# Patient Record
Sex: Female | Born: 1955
Health system: Southern US, Community
[De-identification: ages and names within clinical notes are randomized; demographics above are authoritative.]

## PROBLEM LIST (undated history)

## (undated) DIAGNOSIS — D473 Essential (hemorrhagic) thrombocythemia: Secondary | ICD-10-CM

## (undated) DIAGNOSIS — R569 Unspecified convulsions: Secondary | ICD-10-CM

## (undated) DIAGNOSIS — J189 Pneumonia, unspecified organism: Secondary | ICD-10-CM

## (undated) DIAGNOSIS — I701 Atherosclerosis of renal artery: Secondary | ICD-10-CM

## (undated) DIAGNOSIS — F419 Anxiety disorder, unspecified: Secondary | ICD-10-CM

## (undated) DIAGNOSIS — N189 Chronic kidney disease, unspecified: Secondary | ICD-10-CM

## (undated) DIAGNOSIS — R197 Diarrhea, unspecified: Secondary | ICD-10-CM

## (undated) DIAGNOSIS — R112 Nausea with vomiting, unspecified: Secondary | ICD-10-CM

## (undated) DIAGNOSIS — D759 Disease of blood and blood-forming organs, unspecified: Secondary | ICD-10-CM

## (undated) DIAGNOSIS — I739 Peripheral vascular disease, unspecified: Secondary | ICD-10-CM

## (undated) DIAGNOSIS — G4733 Obstructive sleep apnea (adult) (pediatric): Secondary | ICD-10-CM

## (undated) DIAGNOSIS — R55 Syncope and collapse: Secondary | ICD-10-CM

## (undated) DIAGNOSIS — I219 Acute myocardial infarction, unspecified: Secondary | ICD-10-CM

## (undated) DIAGNOSIS — R7303 Prediabetes: Secondary | ICD-10-CM

## (undated) DIAGNOSIS — D75839 Thrombocytosis, unspecified: Secondary | ICD-10-CM

## (undated) DIAGNOSIS — I10 Essential (primary) hypertension: Secondary | ICD-10-CM

## (undated) DIAGNOSIS — I517 Cardiomegaly: Secondary | ICD-10-CM

## (undated) DIAGNOSIS — M109 Gout, unspecified: Secondary | ICD-10-CM

## (undated) DIAGNOSIS — E785 Hyperlipidemia, unspecified: Secondary | ICD-10-CM

## (undated) HISTORY — DX: Cardiomegaly: I51.7

## (undated) HISTORY — DX: Essential (hemorrhagic) thrombocythemia: D47.3

## (undated) HISTORY — PX: BREAST CYST EXCISION: SHX579

## (undated) HISTORY — DX: Hyperlipidemia, unspecified: E78.5

## (undated) HISTORY — DX: Obstructive sleep apnea (adult) (pediatric): G47.33

## (undated) HISTORY — DX: Thrombocytosis, unspecified: D75.839

## (undated) HISTORY — DX: Nausea with vomiting, unspecified: R11.2

## (undated) HISTORY — DX: Syncope and collapse: R55

## (undated) HISTORY — DX: Gout, unspecified: M10.9

## (undated) HISTORY — DX: Morbid (severe) obesity due to excess calories: E66.01

## (undated) HISTORY — DX: Nausea with vomiting, unspecified: R19.7

## (undated) HISTORY — DX: Essential (primary) hypertension: I10

## (undated) HISTORY — DX: Peripheral vascular disease, unspecified: I73.9

## (undated) HISTORY — DX: Atherosclerosis of renal artery: I70.1

## (undated) HISTORY — PX: OTHER SURGICAL HISTORY: SHX169

---

## 1991-10-25 HISTORY — PX: EXTREMITY CYST EXCISION: SHX1558

## 1992-10-24 HISTORY — PX: PARTIAL HYSTERECTOMY: SHX80

## 1993-10-24 HISTORY — PX: BREAST REDUCTION SURGERY: SHX8

## 1999-10-06 ENCOUNTER — Other Ambulatory Visit: Admission: RE | Admit: 1999-10-06 | Discharge: 1999-10-06 | Payer: Self-pay | Admitting: *Deleted

## 2001-01-01 ENCOUNTER — Other Ambulatory Visit: Admission: RE | Admit: 2001-01-01 | Discharge: 2001-01-01 | Payer: Self-pay | Admitting: *Deleted

## 2002-10-01 ENCOUNTER — Other Ambulatory Visit: Admission: RE | Admit: 2002-10-01 | Discharge: 2002-10-01 | Payer: Self-pay | Admitting: *Deleted

## 2003-10-28 ENCOUNTER — Emergency Department (HOSPITAL_COMMUNITY): Admission: EM | Admit: 2003-10-28 | Discharge: 2003-10-29 | Payer: Self-pay | Admitting: Emergency Medicine

## 2005-03-16 ENCOUNTER — Other Ambulatory Visit: Admission: RE | Admit: 2005-03-16 | Discharge: 2005-03-16 | Payer: Self-pay | Admitting: *Deleted

## 2006-10-18 ENCOUNTER — Other Ambulatory Visit: Admission: RE | Admit: 2006-10-18 | Discharge: 2006-10-18 | Payer: Self-pay | Admitting: *Deleted

## 2007-03-11 ENCOUNTER — Encounter: Admission: RE | Admit: 2007-03-11 | Discharge: 2007-03-11 | Payer: Self-pay | Admitting: Emergency Medicine

## 2007-03-30 ENCOUNTER — Encounter: Admission: RE | Admit: 2007-03-30 | Discharge: 2007-03-30 | Payer: Self-pay | Admitting: Emergency Medicine

## 2007-10-23 ENCOUNTER — Other Ambulatory Visit: Admission: RE | Admit: 2007-10-23 | Discharge: 2007-10-23 | Payer: Self-pay | Admitting: *Deleted

## 2008-12-10 ENCOUNTER — Ambulatory Visit: Payer: Self-pay | Admitting: Hematology and Oncology

## 2008-12-18 LAB — CBC WITH DIFFERENTIAL/PLATELET
Eosinophils Absolute: 0.2 10*3/uL (ref 0.0–0.5)
HCT: 41.1 % (ref 34.8–46.6)
LYMPH%: 16.6 % (ref 14.0–49.7)
MONO#: 0.5 10*3/uL (ref 0.1–0.9)
NEUT#: 5.8 10*3/uL (ref 1.5–6.5)
NEUT%: 73.9 % (ref 38.4–76.8)
Platelets: 783 10*3/uL — ABNORMAL HIGH (ref 145–400)
WBC: 7.9 10*3/uL (ref 3.9–10.3)

## 2008-12-23 LAB — COMPREHENSIVE METABOLIC PANEL
CO2: 28 mEq/L (ref 19–32)
Creatinine, Ser: 1.12 mg/dL (ref 0.40–1.20)
Glucose, Bld: 113 mg/dL — ABNORMAL HIGH (ref 70–99)
Total Bilirubin: 0.3 mg/dL (ref 0.3–1.2)

## 2008-12-23 LAB — PROTEIN ELECTROPHORESIS, SERUM
Albumin ELP: 55.5 % — ABNORMAL LOW (ref 55.8–66.1)
Alpha-1-Globulin: 4.6 % (ref 2.9–4.9)
Gamma Globulin: 16.6 % (ref 11.1–18.8)

## 2008-12-23 LAB — LACTATE DEHYDROGENASE: LDH: 217 U/L (ref 94–250)

## 2008-12-23 LAB — FERRITIN: Ferritin: 214 ng/mL (ref 10–291)

## 2008-12-23 LAB — JAK2 GENOTYPR

## 2009-01-20 ENCOUNTER — Ambulatory Visit (HOSPITAL_COMMUNITY): Admission: RE | Admit: 2009-01-20 | Discharge: 2009-01-20 | Payer: Self-pay | Admitting: Hematology and Oncology

## 2009-01-20 ENCOUNTER — Encounter (INDEPENDENT_AMBULATORY_CARE_PROVIDER_SITE_OTHER): Payer: Self-pay | Admitting: Interventional Radiology

## 2009-01-26 ENCOUNTER — Ambulatory Visit: Payer: Self-pay | Admitting: Hematology and Oncology

## 2009-02-26 LAB — CBC WITH DIFFERENTIAL/PLATELET
Eosinophils Absolute: 0.4 10*3/uL (ref 0.0–0.5)
LYMPH%: 17.9 % (ref 14.0–49.7)
MONO#: 0.4 10*3/uL (ref 0.1–0.9)
NEUT#: 6 10*3/uL (ref 1.5–6.5)
Platelets: 734 10*3/uL — ABNORMAL HIGH (ref 145–400)
RBC: 4.57 10*6/uL (ref 3.70–5.45)
RDW: 13.4 % (ref 11.2–14.5)
WBC: 8.2 10*3/uL (ref 3.9–10.3)

## 2009-05-26 ENCOUNTER — Ambulatory Visit: Payer: Self-pay | Admitting: Hematology and Oncology

## 2009-05-28 LAB — CBC WITH DIFFERENTIAL/PLATELET
BASO%: 0.8 % (ref 0.0–2.0)
Basophils Absolute: 0.1 10*3/uL (ref 0.0–0.1)
EOS%: 4.2 % (ref 0.0–7.0)
HGB: 13.8 g/dL (ref 11.6–15.9)
MCH: 29.4 pg (ref 25.1–34.0)
MCHC: 33.6 g/dL (ref 31.5–36.0)
MCV: 87.5 fL (ref 79.5–101.0)
MONO%: 5.5 % (ref 0.0–14.0)
RBC: 4.68 10*6/uL (ref 3.70–5.45)
RDW: 14.3 % (ref 11.2–14.5)

## 2009-05-28 LAB — BASIC METABOLIC PANEL
BUN: 19 mg/dL (ref 6–23)
Potassium: 4 mEq/L (ref 3.5–5.3)

## 2009-11-24 ENCOUNTER — Emergency Department (HOSPITAL_COMMUNITY): Admission: EM | Admit: 2009-11-24 | Discharge: 2009-11-24 | Payer: Self-pay | Admitting: Family Medicine

## 2009-12-09 ENCOUNTER — Ambulatory Visit: Payer: Self-pay | Admitting: Hematology and Oncology

## 2009-12-11 LAB — CBC WITH DIFFERENTIAL/PLATELET
Basophils Absolute: 0 10*3/uL (ref 0.0–0.1)
Eosinophils Absolute: 0.5 10*3/uL (ref 0.0–0.5)
HCT: 40.2 % (ref 34.8–46.6)
HGB: 13.5 g/dL (ref 11.6–15.9)
LYMPH%: 21.8 % (ref 14.0–49.7)
MCV: 89.6 fL (ref 79.5–101.0)
MONO#: 0.4 10*3/uL (ref 0.1–0.9)
MONO%: 4.9 % (ref 0.0–14.0)
NEUT#: 5.2 10*3/uL (ref 1.5–6.5)
NEUT%: 66.4 % (ref 38.4–76.8)
Platelets: 691 10*3/uL — ABNORMAL HIGH (ref 145–400)
RBC: 4.49 10*6/uL (ref 3.70–5.45)
WBC: 7.8 10*3/uL (ref 3.9–10.3)

## 2009-12-11 LAB — BASIC METABOLIC PANEL
BUN: 17 mg/dL (ref 6–23)
CO2: 27 mEq/L (ref 19–32)
Calcium: 9.1 mg/dL (ref 8.4–10.5)
Creatinine, Ser: 1.19 mg/dL (ref 0.40–1.20)
Glucose, Bld: 145 mg/dL — ABNORMAL HIGH (ref 70–99)

## 2010-04-06 ENCOUNTER — Ambulatory Visit: Payer: Self-pay | Admitting: Hematology and Oncology

## 2010-04-14 LAB — CBC WITH DIFFERENTIAL/PLATELET
BASO%: 0.6 % (ref 0.0–2.0)
EOS%: 4.9 % (ref 0.0–7.0)
HCT: 40.3 % (ref 34.8–46.6)
LYMPH%: 24.8 % (ref 14.0–49.7)
MCH: 29.9 pg (ref 25.1–34.0)
MCHC: 34.3 g/dL (ref 31.5–36.0)
MCV: 87.2 fL (ref 79.5–101.0)
NEUT%: 63.3 % (ref 38.4–76.8)
Platelets: 828 10*3/uL — ABNORMAL HIGH (ref 145–400)

## 2010-04-14 LAB — BASIC METABOLIC PANEL
BUN: 22 mg/dL (ref 6–23)
Calcium: 9.6 mg/dL (ref 8.4–10.5)
Creatinine, Ser: 1.27 mg/dL — ABNORMAL HIGH (ref 0.40–1.20)

## 2010-04-20 LAB — CBC WITH DIFFERENTIAL/PLATELET
Basophils Absolute: 0 10*3/uL (ref 0.0–0.1)
EOS%: 3.9 % (ref 0.0–7.0)
Eosinophils Absolute: 0.2 10*3/uL (ref 0.0–0.5)
HGB: 13.3 g/dL (ref 11.6–15.9)
MCH: 29.8 pg (ref 25.1–34.0)
NEUT#: 4.1 10*3/uL (ref 1.5–6.5)
RBC: 4.45 10*6/uL (ref 3.70–5.45)
RDW: 14.3 % (ref 11.2–14.5)
lymph#: 1.6 10*3/uL (ref 0.9–3.3)

## 2010-04-20 LAB — BASIC METABOLIC PANEL
BUN: 25 mg/dL — ABNORMAL HIGH (ref 6–23)
Chloride: 101 mEq/L (ref 96–112)
Potassium: 3.6 mEq/L (ref 3.5–5.3)
Sodium: 139 mEq/L (ref 135–145)

## 2010-04-27 LAB — CBC WITH DIFFERENTIAL/PLATELET
Basophils Absolute: 0 10*3/uL (ref 0.0–0.1)
Eosinophils Absolute: 0.2 10*3/uL (ref 0.0–0.5)
HCT: 38.1 % (ref 34.8–46.6)
LYMPH%: 32.6 % (ref 14.0–49.7)
MCHC: 34.5 g/dL (ref 31.5–36.0)
MONO#: 0.3 10*3/uL (ref 0.1–0.9)
NEUT#: 2.9 10*3/uL (ref 1.5–6.5)
NEUT%: 56.9 % (ref 38.4–76.8)
Platelets: 567 10*3/uL — ABNORMAL HIGH (ref 145–400)
WBC: 5.1 10*3/uL (ref 3.9–10.3)

## 2010-05-26 ENCOUNTER — Ambulatory Visit: Payer: Self-pay | Admitting: Hematology and Oncology

## 2010-05-26 LAB — CBC WITH DIFFERENTIAL/PLATELET
BASO%: 0.7 % (ref 0.0–2.0)
EOS%: 3.7 % (ref 0.0–7.0)
MCH: 31.4 pg (ref 25.1–34.0)
MCV: 90.6 fL (ref 79.5–101.0)
MONO%: 6.8 % (ref 0.0–14.0)
RBC: 4.13 10*6/uL (ref 3.70–5.45)
RDW: 18.8 % — ABNORMAL HIGH (ref 11.2–14.5)

## 2010-06-09 LAB — CBC WITH DIFFERENTIAL/PLATELET
Eosinophils Absolute: 0.2 10*3/uL (ref 0.0–0.5)
MONO#: 0.4 10*3/uL (ref 0.1–0.9)
MONO%: 8.7 % (ref 0.0–14.0)
NEUT#: 2.9 10*3/uL (ref 1.5–6.5)
RBC: 3.85 10*6/uL (ref 3.70–5.45)
RDW: 23.7 % — ABNORMAL HIGH (ref 11.2–14.5)
WBC: 4.9 10*3/uL (ref 3.9–10.3)
lymph#: 1.3 10*3/uL (ref 0.9–3.3)

## 2010-06-18 LAB — CBC WITH DIFFERENTIAL/PLATELET
Eosinophils Absolute: 0.2 10*3/uL (ref 0.0–0.5)
LYMPH%: 25.5 % (ref 14.0–49.7)
MONO#: 0.5 10*3/uL (ref 0.1–0.9)
NEUT#: 3.8 10*3/uL (ref 1.5–6.5)
Platelets: 369 10*3/uL (ref 145–400)
RBC: 3.97 10*6/uL (ref 3.70–5.45)
WBC: 6 10*3/uL (ref 3.9–10.3)

## 2010-07-02 ENCOUNTER — Ambulatory Visit: Payer: Self-pay | Admitting: Hematology and Oncology

## 2010-07-20 LAB — BASIC METABOLIC PANEL
BUN: 18 mg/dL (ref 6–23)
CO2: 28 mEq/L (ref 19–32)
Chloride: 103 mEq/L (ref 96–112)
Creatinine, Ser: 1.33 mg/dL — ABNORMAL HIGH (ref 0.40–1.20)

## 2010-07-20 LAB — CBC WITH DIFFERENTIAL/PLATELET
Basophils Absolute: 0 10*3/uL (ref 0.0–0.1)
EOS%: 4 % (ref 0.0–7.0)
HCT: 36.8 % (ref 34.8–46.6)
HGB: 12.5 g/dL (ref 11.6–15.9)
LYMPH%: 26.8 % (ref 14.0–49.7)
MCH: 34 pg (ref 25.1–34.0)
MONO#: 0.4 10*3/uL (ref 0.1–0.9)
NEUT%: 60.8 % (ref 38.4–76.8)
Platelets: 384 10*3/uL (ref 145–400)
lymph#: 1.5 10*3/uL (ref 0.9–3.3)

## 2010-08-02 ENCOUNTER — Ambulatory Visit: Payer: Self-pay | Admitting: Hematology and Oncology

## 2010-08-04 LAB — CBC WITH DIFFERENTIAL/PLATELET
BASO%: 0.6 % (ref 0.0–2.0)
EOS%: 3.1 % (ref 0.0–7.0)
HCT: 34.7 % — ABNORMAL LOW (ref 34.8–46.6)
LYMPH%: 25 % (ref 14.0–49.7)
MCH: 36.2 pg — ABNORMAL HIGH (ref 25.1–34.0)
MCHC: 35.3 g/dL (ref 31.5–36.0)
MONO#: 0.4 10*3/uL (ref 0.1–0.9)
NEUT%: 63.4 % (ref 38.4–76.8)
Platelets: 398 10*3/uL (ref 145–400)
RBC: 3.38 10*6/uL — ABNORMAL LOW (ref 3.70–5.45)
WBC: 4.9 10*3/uL (ref 3.9–10.3)
lymph#: 1.2 10*3/uL (ref 0.9–3.3)

## 2010-09-01 ENCOUNTER — Ambulatory Visit: Payer: Self-pay | Admitting: Hematology and Oncology

## 2010-09-01 LAB — CBC WITH DIFFERENTIAL/PLATELET
Basophils Absolute: 0 10*3/uL (ref 0.0–0.1)
Eosinophils Absolute: 0.1 10*3/uL (ref 0.0–0.5)
HCT: 36.8 % (ref 34.8–46.6)
HGB: 12.4 g/dL (ref 11.6–15.9)
MCV: 108.1 fL — ABNORMAL HIGH (ref 79.5–101.0)
NEUT#: 3 10*3/uL (ref 1.5–6.5)
NEUT%: 63.7 % (ref 38.4–76.8)
RDW: 13 % (ref 11.2–14.5)
lymph#: 1.2 10*3/uL (ref 0.9–3.3)

## 2010-10-04 ENCOUNTER — Ambulatory Visit: Payer: Self-pay | Admitting: Hematology and Oncology

## 2010-10-06 LAB — CBC WITH DIFFERENTIAL/PLATELET
BASO%: 0.5 % (ref 0.0–2.0)
EOS%: 3.2 % (ref 0.0–7.0)
HCT: 36.5 % (ref 34.8–46.6)
MCH: 38.4 pg — ABNORMAL HIGH (ref 25.1–34.0)
MCHC: 34.4 g/dL (ref 31.5–36.0)
MONO#: 0.4 10*3/uL (ref 0.1–0.9)
NEUT%: 63.8 % (ref 38.4–76.8)
RBC: 3.27 10*6/uL — ABNORMAL LOW (ref 3.70–5.45)
WBC: 5.1 10*3/uL (ref 3.9–10.3)
lymph#: 1.3 10*3/uL (ref 0.9–3.3)

## 2010-10-06 LAB — BASIC METABOLIC PANEL
CO2: 29 mEq/L (ref 19–32)
Calcium: 9.1 mg/dL (ref 8.4–10.5)
Chloride: 100 mEq/L (ref 96–112)
Creatinine, Ser: 1.4 mg/dL — ABNORMAL HIGH (ref 0.40–1.20)
Sodium: 141 mEq/L (ref 135–145)

## 2010-11-05 ENCOUNTER — Ambulatory Visit: Payer: Self-pay | Admitting: Hematology and Oncology

## 2010-11-09 LAB — CBC WITH DIFFERENTIAL/PLATELET
BASO%: 0.6 % (ref 0.0–2.0)
Basophils Absolute: 0 10*3/uL (ref 0.0–0.1)
EOS%: 3.7 % (ref 0.0–7.0)
Eosinophils Absolute: 0.2 10*3/uL (ref 0.0–0.5)
HCT: 36.5 % (ref 34.8–46.6)
HGB: 12.5 g/dL (ref 11.6–15.9)
LYMPH%: 33.4 % (ref 14.0–49.7)
MCH: 37.9 pg — ABNORMAL HIGH (ref 25.1–34.0)
MCHC: 34.3 g/dL (ref 31.5–36.0)
MCV: 110.3 fL — ABNORMAL HIGH (ref 79.5–101.0)
MONO#: 0.5 10*3/uL (ref 0.1–0.9)
MONO%: 9.6 % (ref 0.0–14.0)
NEUT#: 2.7 10*3/uL (ref 1.5–6.5)
NEUT%: 52.7 % (ref 38.4–76.8)
Platelets: 344 10*3/uL (ref 145–400)
RBC: 3.31 10*6/uL — ABNORMAL LOW (ref 3.70–5.45)
RDW: 13.4 % (ref 11.2–14.5)
WBC: 5.2 10*3/uL (ref 3.9–10.3)
lymph#: 1.7 10*3/uL (ref 0.9–3.3)

## 2010-12-07 ENCOUNTER — Ambulatory Visit
Admission: RE | Admit: 2010-12-07 | Discharge: 2010-12-07 | Disposition: A | Discharge status: Home / Self Care | Payer: BC Managed Care – PPO | Source: Ambulatory Visit | Attending: Cardiovascular Disease | Admitting: Cardiovascular Disease

## 2010-12-07 ENCOUNTER — Other Ambulatory Visit: Payer: Self-pay | Admitting: Hematology and Oncology

## 2010-12-07 ENCOUNTER — Other Ambulatory Visit: Payer: Self-pay | Admitting: Cardiovascular Disease

## 2010-12-07 ENCOUNTER — Encounter (HOSPITAL_BASED_OUTPATIENT_CLINIC_OR_DEPARTMENT_OTHER): Payer: BC Managed Care – PPO | Admitting: Hematology and Oncology

## 2010-12-07 DIAGNOSIS — D473 Essential (hemorrhagic) thrombocythemia: Secondary | ICD-10-CM

## 2010-12-07 DIAGNOSIS — Z01818 Encounter for other preprocedural examination: Secondary | ICD-10-CM

## 2010-12-07 DIAGNOSIS — Z23 Encounter for immunization: Secondary | ICD-10-CM

## 2010-12-07 LAB — CBC WITH DIFFERENTIAL/PLATELET
BASO%: 0.5 % (ref 0.0–2.0)
EOS%: 3.7 % (ref 0.0–7.0)
HCT: 36.3 % (ref 34.8–46.6)
MCH: 38.3 pg — ABNORMAL HIGH (ref 25.1–34.0)
MCHC: 34.1 g/dL (ref 31.5–36.0)
MONO#: 0.5 10*3/uL (ref 0.1–0.9)
RDW: 13.4 % (ref 11.2–14.5)
WBC: 5.5 10*3/uL (ref 3.9–10.3)
lymph#: 1.8 10*3/uL (ref 0.9–3.3)

## 2010-12-13 ENCOUNTER — Observation Stay (HOSPITAL_COMMUNITY)
Admission: RE | Admit: 2010-12-13 | Discharge: 2010-12-14 | DRG: 479 | Disposition: A | Discharge status: Home / Self Care | Payer: BC Managed Care – PPO | Source: Ambulatory Visit | Attending: Cardiovascular Disease | Admitting: Cardiovascular Disease

## 2010-12-13 DIAGNOSIS — N289 Disorder of kidney and ureter, unspecified: Secondary | ICD-10-CM | POA: Insufficient documentation

## 2010-12-13 DIAGNOSIS — D473 Essential (hemorrhagic) thrombocythemia: Secondary | ICD-10-CM | POA: Insufficient documentation

## 2010-12-13 DIAGNOSIS — I701 Atherosclerosis of renal artery: Secondary | ICD-10-CM | POA: Insufficient documentation

## 2010-12-13 DIAGNOSIS — E119 Type 2 diabetes mellitus without complications: Secondary | ICD-10-CM | POA: Insufficient documentation

## 2010-12-13 DIAGNOSIS — I150 Renovascular hypertension: Secondary | ICD-10-CM | POA: Insufficient documentation

## 2010-12-13 HISTORY — DX: Atherosclerosis of renal artery: I70.1

## 2010-12-13 LAB — POCT ACTIVATED CLOTTING TIME
Activated Clotting Time: 181 seconds
Activated Clotting Time: 140 seconds

## 2010-12-13 LAB — GLUCOSE, CAPILLARY
Glucose-Capillary: 131 mg/dL — ABNORMAL HIGH (ref 70–99)
Glucose-Capillary: 86 mg/dL (ref 70–99)

## 2010-12-14 LAB — GLUCOSE, CAPILLARY

## 2010-12-14 LAB — BASIC METABOLIC PANEL
CO2: 29 mEq/L (ref 19–32)
Calcium: 8.9 mg/dL (ref 8.4–10.5)
Creatinine, Ser: 1.31 mg/dL — ABNORMAL HIGH (ref 0.4–1.2)
GFR calc Af Amer: 51 mL/min — ABNORMAL LOW (ref >60)
GFR calc non Af Amer: 42 mL/min — ABNORMAL LOW (ref >60)
Sodium: 141 mEq/L (ref 135–145)

## 2010-12-14 LAB — CBC
MCH: 37 pg — ABNORMAL HIGH (ref 26.0–34.0)
MCHC: 34.8 g/dL (ref 30.0–36.0)
Platelets: 294 10*3/uL (ref 150–400)
RDW: 12.4 % (ref 11.5–15.5)

## 2010-12-15 NOTE — Procedures (Signed)
Sara [name], Sara [name]              ACCOUNT NO.:  [id number]  MEDICAL RECORD NO.:  [id number]           PATIENT TYPE:  I  LOCATION:  6531                         FACILITY:  [facility]  PHYSICIAN:  Nanetta Batty, M.D.   DATE OF BIRTH:  [birth year]  DATE OF PROCEDURE: DATE OF DISCHARGE:                   PERIPHERAL VASCULAR INVASIVE PROCEDURE   PROCEDURES:  Abdominal aortogram, selective right renal angiogram, right renal artery PTA, and stent procedure.  Ms. Bollman is a 55 year old mildly overweight African American female with history of difficult to control hypertension and thrombocytosis. She has been followed by Dr. Rachelle Hora Croitoru on multiple antihypertensive medications.  Renal Dopplers showed right renal artery stenosis with progression of disease on serial studies.  She presents now for angiography and potential intervention for treatment of renovascular hypertension.  PROCEDURE DESCRIPTION:  The patient was brought to the second floor [facility] in the postabsorptive state.  She was premedicated with p.o. Valium, IV Versed, and fentanyl.  Her right groin was prepped and shaved in the usual sterile fashion.  A 1% Xylocaine was used for local anesthesia.  A 6-French sheath was inserted into the right femoral artery using standard Seldinger technique.  A 6-French pigtail catheter was used for abdominal aortography.  A 5-French right Judkins catheter was used for right renal artery angiography.  Pullback gradient was obtained across the ostium of the vessel which measured 50 mmHg.  Visipaque dye was used for  the entirety of the case.  Retrograde aortic pressures were monitored during the case.  ANGIOGRAPHIC RESULTS: 1. Abdominal aorta.     a.     Renal artery - 80-90% ostial right renal artery stenosis      with 15-mm gradient.     b.     30% proximal left renal artery stenosis.  PROCEDURE DESCRIPTION:  The patient received 4000 units of  heparin intravenously.  Using a 6-French short JR-4 guide catheter along with an 0.14 x 190 Spartacore wire and a 4 x 1.5 Aviator balloon pre dilatation was performed.  The ostium of right renal artery was then stented with a 6 x 15 Genesis on aviator balloon stent premount.  This was deployed under angiographic control at 10 atmospheres resulting reduction of 90% ostial right renal artery stenosis to 0% residual.  The patient tolerated the procedure well.  Pigtail catheter was removed.  Sheath was sewn securely in place.  The patient left lab in stable condition.  She did receive 600 mg of Plavix at the end of the case.  IMPRESSION:  Successful right renal artery percutaneous transluminal angioplasty and stenting for high-grade right renal artery stenosis and a suggestion of renal vascular hypertension using bare-metal stent.  The patient was Plavix loaded.  The patient was gently hydrated overnight, and discharged home in the morning.  She will get followup renal Doppler studies, and will see me back in followup.  She left lab in stable condition.     Nanetta Batty, M.D.     Cordelia Pen  D:  12/13/2010  T:  12/14/2010  Job:  [id number]  cc:   [facility],  MD  Electronically Signed by Nanetta Batty M.D. on 12/15/2010 11:35:08 AM

## 2011-01-05 NOTE — Discharge Summary (Signed)
  Sara [name], WAREING              ACCOUNT NO.:  [id number]  MEDICAL RECORD NO.:  [id number]           PATIENT TYPE:  I  LOCATION:  6531                         FACILITY:  [facility]  PHYSICIAN:  Nanetta Batty, M.D.   DATE OF BIRTH:  27-Sep-1956  DATE OF ADMISSION:  12/13/2010 DATE OF DISCHARGE:  12/14/2010                              DISCHARGE SUMMARY   DISCHARGE DIAGNOSIS: 1. Peripheral vascular disease, status post elective right renal     artery stenting this admission. 2. Treated hypertension. 3. Stage I renal insufficiency. 4. Type 2 non-insulin-dependent diabetes. 5. Morbid obesity. 6. Essential thrombocytosis.  HOSPITAL COURSE:  Ms. Blubaugh is a 55 year old female followed by Dr. Jeral Fruit and Dr. Allyne Gee, who has hypertension and diabetes.  She has had renal artery stenosis by Dopplers.  She had progression of disease.  She saw Dr. Allyson Sabal in January and he was concerned about renal preservation. She was set up for an elective renal artery stenting.  The patient was admitted on December 13, 2010, underwent peripheral angiogram and renal artery stenting as noted and she tolerated this well.  On December 14, 2010, she is ambulatory without complaints.  Her renal function shows a BUN of 17, creatinine 1.31.  We feel she can be discharged later on December 14, 2010.  She will be discharged on aspirin and Plavix.  She will need follow up renal Dopplers and a follow up with Dr. Allyson Sabal as an outpatient.  LABORATORY DATA:  White count 4.6, hemoglobin 11.8, hematocrit 33.9, platelets 294.  Sodium 141, potassium 3.9, BUN 17, creatinine 1.31. Chest x-ray done on December 07, 2010, shows no active disease.  DISPOSITION:  The patient is discharged in stable condition and will follow up with Dr. Allyson Sabal as noted.  Please see med rec for complete discharge medications.     Abelino Derrick, P.A.   ______________________________ Nanetta Batty, M.D.   Lenard Lance  D:  12/14/2010  T:   12/14/2010  Job:  [id number]  cc:   Vicente Serene I. Dalene Carrow, M.D. Candyce Churn. Allyne Gee, M.D.  Electronically Signed by Corine Shelter P.A. on 12/15/2010 12:24:19 PM Electronically Signed by Nanetta Batty M.D. on 01/05/2011 01:56:56 PM

## 2011-01-11 ENCOUNTER — Other Ambulatory Visit: Payer: Self-pay | Admitting: Hematology and Oncology

## 2011-01-11 ENCOUNTER — Encounter (HOSPITAL_BASED_OUTPATIENT_CLINIC_OR_DEPARTMENT_OTHER): Payer: BC Managed Care – PPO | Admitting: Hematology and Oncology

## 2011-01-11 DIAGNOSIS — Z23 Encounter for immunization: Secondary | ICD-10-CM

## 2011-01-11 DIAGNOSIS — D473 Essential (hemorrhagic) thrombocythemia: Secondary | ICD-10-CM

## 2011-01-11 LAB — BASIC METABOLIC PANEL
BUN: 20 mg/dL (ref 6–23)
Calcium: 9.4 mg/dL (ref 8.4–10.5)
Creatinine, Ser: 1.31 mg/dL — ABNORMAL HIGH (ref 0.40–1.20)
Glucose, Bld: 100 mg/dL — ABNORMAL HIGH (ref 70–99)
Potassium: 3.8 mEq/L (ref 3.5–5.3)

## 2011-01-11 LAB — CBC WITH DIFFERENTIAL/PLATELET
Basophils Absolute: 0 10*3/uL (ref 0.0–0.1)
EOS%: 3.8 % (ref 0.0–7.0)
Eosinophils Absolute: 0.2 10*3/uL (ref 0.0–0.5)
HGB: 12.3 g/dL (ref 11.6–15.9)
LYMPH%: 32.1 % (ref 14.0–49.7)
MCH: 37.5 pg — ABNORMAL HIGH (ref 25.1–34.0)
MCV: 106.4 fL — ABNORMAL HIGH (ref 79.5–101.0)
MONO%: 8.2 % (ref 0.0–14.0)
NEUT#: 2.7 10*3/uL (ref 1.5–6.5)
NEUT%: 55.5 % (ref 38.4–76.8)
Platelets: 323 10*3/uL (ref 145–400)

## 2011-02-03 LAB — TISSUE HYBRIDIZATION (BONE MARROW)-NCBH

## 2011-02-03 LAB — PROTIME-INR
INR: 0.9 (ref 0.00–1.49)
Prothrombin Time: 12.4 seconds (ref 11.6–15.2)

## 2011-02-03 LAB — CBC
MCV: 88.7 fL (ref 78.0–100.0)
Platelets: 881 10*3/uL — ABNORMAL HIGH (ref 150–400)
WBC: 8.2 10*3/uL (ref 4.0–10.5)

## 2011-02-03 LAB — CHROMOSOME ANALYSIS, BONE MARROW

## 2011-03-09 ENCOUNTER — Other Ambulatory Visit: Payer: Self-pay | Admitting: Hematology and Oncology

## 2011-03-09 ENCOUNTER — Encounter (HOSPITAL_BASED_OUTPATIENT_CLINIC_OR_DEPARTMENT_OTHER): Payer: BC Managed Care – PPO | Admitting: Hematology and Oncology

## 2011-03-09 DIAGNOSIS — Z23 Encounter for immunization: Secondary | ICD-10-CM

## 2011-03-09 DIAGNOSIS — D473 Essential (hemorrhagic) thrombocythemia: Secondary | ICD-10-CM

## 2011-03-09 LAB — CBC WITH DIFFERENTIAL/PLATELET
BASO%: 0.5 % (ref 0.0–2.0)
Eosinophils Absolute: 0.2 10*3/uL (ref 0.0–0.5)
HCT: 34.5 % — ABNORMAL LOW (ref 34.8–46.6)
MCHC: 34.8 g/dL (ref 31.5–36.0)
MONO#: 0.4 10*3/uL (ref 0.1–0.9)
NEUT#: 2.5 10*3/uL (ref 1.5–6.5)
NEUT%: 57.6 % (ref 38.4–76.8)
Platelets: 308 10*3/uL (ref 145–400)
RBC: 3.07 10*6/uL — ABNORMAL LOW (ref 3.70–5.45)
WBC: 4.3 10*3/uL (ref 3.9–10.3)
lymph#: 1.2 10*3/uL (ref 0.9–3.3)

## 2011-06-03 ENCOUNTER — Other Ambulatory Visit: Payer: Self-pay | Admitting: Hematology and Oncology

## 2011-06-03 ENCOUNTER — Encounter (HOSPITAL_BASED_OUTPATIENT_CLINIC_OR_DEPARTMENT_OTHER): Payer: BC Managed Care – PPO | Admitting: Hematology and Oncology

## 2011-06-03 DIAGNOSIS — B029 Zoster without complications: Secondary | ICD-10-CM

## 2011-06-03 DIAGNOSIS — D473 Essential (hemorrhagic) thrombocythemia: Secondary | ICD-10-CM

## 2011-06-03 LAB — BASIC METABOLIC PANEL
BUN: 22 mg/dL (ref 6–23)
CO2: 27 mEq/L (ref 19–32)
Chloride: 103 mEq/L (ref 96–112)
Creatinine, Ser: 1.42 mg/dL — ABNORMAL HIGH (ref 0.50–1.10)
Glucose, Bld: 96 mg/dL (ref 70–99)

## 2011-06-03 LAB — CBC WITH DIFFERENTIAL/PLATELET
Basophils Absolute: 0 10*3/uL (ref 0.0–0.1)
HCT: 33.8 % — ABNORMAL LOW (ref 34.8–46.6)
HGB: 11.9 g/dL (ref 11.6–15.9)
LYMPH%: 29 % (ref 14.0–49.7)
MONO#: 0.5 10*3/uL (ref 0.1–0.9)
NEUT%: 49.1 % (ref 38.4–76.8)
Platelets: 300 10*3/uL (ref 145–400)
WBC: 3 10*3/uL — ABNORMAL LOW (ref 3.9–10.3)
lymph#: 0.9 10*3/uL (ref 0.9–3.3)

## 2011-06-29 ENCOUNTER — Encounter (HOSPITAL_BASED_OUTPATIENT_CLINIC_OR_DEPARTMENT_OTHER): Payer: BC Managed Care – PPO | Admitting: Hematology and Oncology

## 2011-06-29 ENCOUNTER — Other Ambulatory Visit: Payer: Self-pay | Admitting: Hematology and Oncology

## 2011-06-29 DIAGNOSIS — D473 Essential (hemorrhagic) thrombocythemia: Secondary | ICD-10-CM

## 2011-06-29 LAB — CBC WITH DIFFERENTIAL/PLATELET
BASO%: 0.4 % (ref 0.0–2.0)
EOS%: 2.6 % (ref 0.0–7.0)
Eosinophils Absolute: 0.1 10*3/uL (ref 0.0–0.5)
MCHC: 35.2 g/dL (ref 31.5–36.0)
MCV: 114.8 fL — ABNORMAL HIGH (ref 79.5–101.0)
MONO%: 8.5 % (ref 0.0–14.0)
NEUT#: 3.1 10*3/uL (ref 1.5–6.5)
RBC: 3.07 10*6/uL — ABNORMAL LOW (ref 3.70–5.45)
RDW: 13.3 % (ref 11.2–14.5)

## 2011-08-10 ENCOUNTER — Encounter (HOSPITAL_BASED_OUTPATIENT_CLINIC_OR_DEPARTMENT_OTHER): Payer: BC Managed Care – PPO | Admitting: Hematology and Oncology

## 2011-08-10 ENCOUNTER — Other Ambulatory Visit: Payer: Self-pay | Admitting: Hematology and Oncology

## 2011-08-10 DIAGNOSIS — D473 Essential (hemorrhagic) thrombocythemia: Secondary | ICD-10-CM

## 2011-08-10 LAB — CBC WITH DIFFERENTIAL/PLATELET
BASO%: 0.4 % (ref 0.0–2.0)
Eosinophils Absolute: 0.2 10*3/uL (ref 0.0–0.5)
MONO#: 0.5 10*3/uL (ref 0.1–0.9)
NEUT#: 2.3 10*3/uL (ref 1.5–6.5)
Platelets: 335 10*3/uL (ref 145–400)
RBC: 3.35 10*6/uL — ABNORMAL LOW (ref 3.70–5.45)
RDW: 11.8 % (ref 11.2–14.5)
WBC: 4.6 10*3/uL (ref 3.9–10.3)
lymph#: 1.6 10*3/uL (ref 0.9–3.3)

## 2011-09-30 ENCOUNTER — Telehealth: Payer: Self-pay | Admitting: Nurse Practitioner

## 2011-09-30 ENCOUNTER — Other Ambulatory Visit: Payer: Self-pay | Admitting: Nurse Practitioner

## 2011-09-30 ENCOUNTER — Other Ambulatory Visit (HOSPITAL_BASED_OUTPATIENT_CLINIC_OR_DEPARTMENT_OTHER): Payer: BC Managed Care – PPO | Admitting: Lab

## 2011-09-30 ENCOUNTER — Other Ambulatory Visit: Payer: Self-pay | Admitting: Hematology and Oncology

## 2011-09-30 DIAGNOSIS — D471 Chronic myeloproliferative disease: Secondary | ICD-10-CM

## 2011-09-30 DIAGNOSIS — D473 Essential (hemorrhagic) thrombocythemia: Secondary | ICD-10-CM

## 2011-09-30 LAB — CBC WITH DIFFERENTIAL/PLATELET
Eosinophils Absolute: 0.1 10*3/uL (ref 0.0–0.5)
HCT: 36.8 % (ref 34.8–46.6)
HGB: 12.6 g/dL (ref 11.6–15.9)
LYMPH%: 34.7 % (ref 14.0–49.7)
MONO#: 0.4 10*3/uL (ref 0.1–0.9)
NEUT#: 2.3 10*3/uL (ref 1.5–6.5)
NEUT%: 52.4 % (ref 38.4–76.8)
Platelets: 316 10*3/uL (ref 145–400)
WBC: 4.4 10*3/uL (ref 3.9–10.3)
lymph#: 1.5 10*3/uL (ref 0.9–3.3)

## 2011-09-30 LAB — BASIC METABOLIC PANEL
CO2: 31 mEq/L (ref 19–32)
Calcium: 9.2 mg/dL (ref 8.4–10.5)
Chloride: 98 mEq/L (ref 96–112)
Creatinine, Ser: 1.23 mg/dL — ABNORMAL HIGH (ref 0.50–1.10)
Glucose, Bld: 90 mg/dL (ref 70–99)

## 2011-09-30 NOTE — Telephone Encounter (Signed)
Called pt to inform to continue same dose of Hydrea- 500mg  BID.  Left message on work number to return call.  Home number stated "mailbox full".

## 2011-10-06 ENCOUNTER — Telehealth: Payer: Self-pay | Admitting: Hematology and Oncology

## 2011-10-06 NOTE — Telephone Encounter (Signed)
lmonvm for pt re appt for 1/22 @ 11 am.

## 2011-11-09 ENCOUNTER — Other Ambulatory Visit: Payer: Self-pay | Admitting: *Deleted

## 2011-11-09 DIAGNOSIS — D473 Essential (hemorrhagic) thrombocythemia: Secondary | ICD-10-CM

## 2011-11-09 MED ORDER — HYDROXYUREA 500 MG PO CAPS: 500.0000 mg | ORAL_CAPSULE | Freq: Two times a day (BID) | ORAL | Status: AC

## 2011-11-10 ENCOUNTER — Encounter: Payer: Self-pay | Admitting: *Deleted

## 2011-11-15 ENCOUNTER — Other Ambulatory Visit (HOSPITAL_BASED_OUTPATIENT_CLINIC_OR_DEPARTMENT_OTHER): Payer: BC Managed Care – PPO | Admitting: Lab

## 2011-11-15 ENCOUNTER — Ambulatory Visit (HOSPITAL_BASED_OUTPATIENT_CLINIC_OR_DEPARTMENT_OTHER): Payer: BC Managed Care – PPO | Admitting: Hematology and Oncology

## 2011-11-15 VITALS — BP 149/87 | HR 84 | Temp 97.1°F | Ht 61.0 in | Wt 232.8 lb

## 2011-11-15 DIAGNOSIS — D473 Essential (hemorrhagic) thrombocythemia: Secondary | ICD-10-CM

## 2011-11-15 DIAGNOSIS — D471 Chronic myeloproliferative disease: Secondary | ICD-10-CM

## 2011-11-15 LAB — CBC WITH DIFFERENTIAL/PLATELET
Basophils Absolute: 0 10*3/uL (ref 0.0–0.1)
Eosinophils Absolute: 0.1 10*3/uL (ref 0.0–0.5)
HCT: 37.4 % (ref 34.8–46.6)
HGB: 12.9 g/dL (ref 11.6–15.9)
LYMPH%: 33.5 % (ref 14.0–49.7)
MCHC: 34.4 g/dL (ref 31.5–36.0)
MONO#: 0.3 10*3/uL (ref 0.1–0.9)
NEUT%: 56.4 % (ref 38.4–76.8)
Platelets: 332 10*3/uL (ref 145–400)
WBC: 4.5 10*3/uL (ref 3.9–10.3)
lymph#: 1.5 10*3/uL (ref 0.9–3.3)

## 2011-11-15 LAB — COMPREHENSIVE METABOLIC PANEL
ALT: 18 U/L (ref 0–35)
BUN: 21 mg/dL (ref 6–23)
CO2: 27 mEq/L (ref 19–32)
Calcium: 9.1 mg/dL (ref 8.4–10.5)
Chloride: 101 mEq/L (ref 96–112)
Creatinine, Ser: 1.25 mg/dL — ABNORMAL HIGH (ref 0.50–1.10)
Total Bilirubin: 0.5 mg/dL (ref 0.3–1.2)

## 2011-11-15 NOTE — Progress Notes (Signed)
CC:   Robyn N. Allyne Gee, M.D. Nanetta Batty, M.D.  The patient is a 56 year old year woman with essential thrombocytosis, who presents for followup.  INTERIM HISTORY:  Ms. Bruhn has had her labs monitored whilst on Hydrea, and they have been essentially unremarkable for the last 4 or 5 months on Hydrea at the current dose.  She has no significant complaints.  Denies mucositis or GI upset.  Her weight is stable.  MEDICATIONS:  Hydrea 500 mg b.i.d., aspirin 81 mg daily, Exforge, Bystolic, carvedilol.  Rest of medications reviewed and updated.  ALLERGIES:  Sulfa.  PHYSICAL EXAMINATION:  Alert and oriented x3.  Vital signs:  Pulse 84, blood pressure 149/87, temperature 97.1, respirations 20.  HEENT:  Head is atraumatic, normocephalic.  Sclerae anicteric.  Mouth moist.  Neck: Supple.  Chest:  Clear.  CVS:  Unremarkable.  Abdomen:  Soft.  Bowel sounds present.  Extremities:  No edema.  LABORATORY DATA:  11/19/2010 white cell count 4.4, hemoglobin 12.6, hematocrit 36.8, platelets 216.  Sodium 139, potassium 3.5, chloride 98, CO2 31, BUN 20, creatinine 1.23 (1.4), calcium 9.2.  IMPRESSION/PLAN:  Mrs. Donley is a 56 year old woman with essential thrombocytosis and JAK2 mutation positivity.  CBC has remained unremarkable and has done so for the last 6 months on the current dose of Hydrea.  She also had a stent placed in the kidney.  I have asked that she decrease Hydrea to 500 mg p.o. Daily. We will see how she does with this.  Have it rechecked in 1 month's time with blood work.  She follows up in 6 months' time for a clinic visit.    ______________________________ Laurice Record, M.D. LIO/MEDQ  D:  11/15/2011  T:  11/15/2011  Job:  [id number]

## 2011-11-15 NOTE — Progress Notes (Signed)
This office note has been dictated.

## 2011-12-16 ENCOUNTER — Other Ambulatory Visit: Payer: BC Managed Care – PPO | Admitting: Lab

## 2011-12-19 ENCOUNTER — Other Ambulatory Visit (HOSPITAL_BASED_OUTPATIENT_CLINIC_OR_DEPARTMENT_OTHER): Payer: BC Managed Care – PPO | Admitting: Lab

## 2011-12-19 DIAGNOSIS — D473 Essential (hemorrhagic) thrombocythemia: Secondary | ICD-10-CM

## 2011-12-19 LAB — CBC WITH DIFFERENTIAL/PLATELET
BASO%: 0.7 % (ref 0.0–2.0)
Eosinophils Absolute: 0.3 10*3/uL (ref 0.0–0.5)
LYMPH%: 33.2 % (ref 14.0–49.7)
MCHC: 35.2 g/dL (ref 31.5–36.0)
MCV: 103.9 fL — ABNORMAL HIGH (ref 79.5–101.0)
MONO%: 10.3 % (ref 0.0–14.0)
Platelets: 352 10*3/uL (ref 145–400)
RBC: 3.36 10*6/uL — ABNORMAL LOW (ref 3.70–5.45)
nRBC: 0 % (ref 0–0)

## 2011-12-20 ENCOUNTER — Other Ambulatory Visit: Payer: Self-pay | Admitting: Nurse Practitioner

## 2011-12-20 ENCOUNTER — Telehealth: Payer: Self-pay | Admitting: Nurse Practitioner

## 2011-12-20 DIAGNOSIS — D473 Essential (hemorrhagic) thrombocythemia: Secondary | ICD-10-CM

## 2011-12-20 NOTE — Telephone Encounter (Signed)
Instructed pt to continue Hydrea at current dose- 500mg  daily per Dr. Dalene Carrow.  Recheck lab in two months.  Orders sent to scheduling.

## 2011-12-21 ENCOUNTER — Telehealth: Payer: Self-pay | Admitting: Hematology and Oncology

## 2011-12-21 NOTE — Telephone Encounter (Signed)
S/w pt re appt for 4/23 @ 4 pm.

## 2012-02-14 ENCOUNTER — Telehealth: Payer: Self-pay | Admitting: *Deleted

## 2012-02-14 ENCOUNTER — Other Ambulatory Visit (HOSPITAL_BASED_OUTPATIENT_CLINIC_OR_DEPARTMENT_OTHER): Payer: BC Managed Care – PPO

## 2012-02-14 DIAGNOSIS — D473 Essential (hemorrhagic) thrombocythemia: Secondary | ICD-10-CM

## 2012-02-14 LAB — CBC WITH DIFFERENTIAL/PLATELET
BASO%: 0.8 % (ref 0.0–2.0)
EOS%: 4 % (ref 0.0–7.0)
HCT: 36.7 % (ref 34.8–46.6)
MCH: 35.2 pg — ABNORMAL HIGH (ref 25.1–34.0)
MCHC: 34.1 g/dL (ref 31.5–36.0)
NEUT%: 55.8 % (ref 38.4–76.8)
lymph#: 1.6 10*3/uL (ref 0.9–3.3)

## 2012-02-14 NOTE — Telephone Encounter (Signed)
Dr. Arline Asp reviewed lab results today.   Spoke with pt at home and instructed pt to continue with Hydrea 500 mg  Po daily as per Dr. Arline Asp.   Confirmed date and time for appts  On 05/01/12.   Pt voiced understanding.

## 2012-03-06 ENCOUNTER — Emergency Department (INDEPENDENT_AMBULATORY_CARE_PROVIDER_SITE_OTHER)
Admission: EM | Admit: 2012-03-06 | Discharge: 2012-03-06 | Disposition: A | Discharge status: Home / Self Care | Payer: BC Managed Care – PPO | Source: Home / Self Care | Attending: Family Medicine | Admitting: Family Medicine

## 2012-03-06 ENCOUNTER — Emergency Department (INDEPENDENT_AMBULATORY_CARE_PROVIDER_SITE_OTHER): Payer: BC Managed Care – PPO

## 2012-03-06 ENCOUNTER — Encounter (HOSPITAL_COMMUNITY): Payer: Self-pay | Admitting: Cardiology

## 2012-03-06 DIAGNOSIS — M109 Gout, unspecified: Secondary | ICD-10-CM

## 2012-03-06 MED ORDER — COLCHICINE 0.6 MG PO TABS: 0.6000 mg | ORAL_TABLET | Freq: Two times a day (BID) | ORAL | Status: DC

## 2012-03-06 MED ORDER — HYDROCODONE-ACETAMINOPHEN 5-325 MG PO TABS: 2.0000 | ORAL_TABLET | ORAL | Status: AC | PRN

## 2012-03-06 MED ORDER — INDOMETHACIN 25 MG PO CAPS: 25.0000 mg | ORAL_CAPSULE | Freq: Two times a day (BID) | ORAL | Status: DC

## 2012-03-06 NOTE — ED Provider Notes (Signed)
History     CSN: [id number]  Arrival date & time 03/06/12  1010   First MD Initiated Contact with Patient 03/06/12 1113      Chief Complaint  Patient presents with  . Foot Pain    (Consider location/radiation/quality/duration/timing/severity/associated sxs/prior treatment) Patient is a 56 y.o. female presenting with lower extremity pain. The history is provided by the patient. No language interpreter was used.  Foot Pain This is a new problem. The current episode started more than 2 days ago. The problem occurs constantly. The problem has been gradually worsening. The symptoms are aggravated by walking. The symptoms are relieved by nothing.  Pt complains of redness and swelling to right foot.  Pt admits to eating smoked meat before swelling.  Pt thinks she has gout  Past Medical History  Diagnosis Date  . Thrombocytosis   . Hypertension   . Diabetes mellitus     Past Surgical History  Procedure Date  . Breast reduction surgery 1993    S/P Bilateral breast reduction  . Cesarean section 1980, 1986    Times  Two.  . Breast cyst excision     S/P Benign Right Breast Cyst Removal.  . Partial hysterectomy   . Extremity cyst excision     left    Family History  Problem Relation Age of Onset  . Breast cancer Maternal Aunt     2 maternal aunts had breast cancer.  . Breast cancer Paternal Grandmother     History  Substance Use Topics  . Smoking status: Never Smoker   . Smokeless tobacco: Not on file  . Alcohol Use: No    OB History    Grav Para Term Preterm Abortions TAB SAB Ect Mult Living                  Review of Systems  Musculoskeletal: Positive for myalgias and joint swelling.  All other systems reviewed and are negative.    Allergies  Sulfa antibiotics  Home Medications   Current Outpatient Rx  Name Route Sig Dispense Refill  . AMLODIPINE-VALSARTAN-HCTZ 10-320-25 MG PO TABS Oral Take 1 tablet by mouth daily.    . ASPIRIN 81 MG PO TABS Oral Take  81 mg by mouth daily.    . ATORVASTATIN CALCIUM 10 MG PO TABS Oral Take 10 mg by mouth daily.    Marland Kitchen CARVEDILOL 6.25 MG PO TABS Oral Take 6.25 mg by mouth 2 (two) times daily with a meal.    . VITAMIN D3 3000 UNITS PO TABS Oral Take 2,000 Units by mouth daily.    Marland Kitchen CLOPIDOGREL BISULFATE 75 MG PO TABS Oral Take 75 mg by mouth daily.    Marland Kitchen HYDROXYUREA 500 MG PO CAPS Oral Take 500 mg by mouth daily. May take with food to minimize GI side effects.    Marland Kitchen SITAGLIPTIN-METFORMIN HCL 50-500 MG PO TABS Oral Take 1 tablet by mouth daily.    Marland Kitchen ALPRAZOLAM 0.5 MG PO TABS Oral Take 0.5 mg by mouth 2 (two) times daily as needed.      BP 125/71  Pulse 86  Temp(Src) 98.8 F (37.1 C) (Oral)  Resp 18  SpO2 98%  Physical Exam  Nursing note reviewed. Constitutional: She appears well-developed and well-nourished.  HENT:  Head: Normocephalic and atraumatic.  Eyes: Conjunctivae and EOM are normal. Pupils are equal, round, and reactive to light.  Musculoskeletal: She exhibits edema and tenderness.       Swollen tender right 1st metatarsal,  Pain with  palpation  Neurological: She is alert.  Skin: Skin is warm and dry.  Psychiatric: She has a normal mood and affect.    ED Course  Procedures (including critical care time)  Labs Reviewed - No data to display No results found.   No diagnosis found.    MDM      Xray reviewed,  I counseled pt,  I suspect gout.   Pt given rx for colchicine and indocin,  And hydrocodone.  Pt advised to see her Md for recheck   Lonia Skinner [place], [place] 03/06/12 1219

## 2012-03-06 NOTE — ED Notes (Signed)
Pt reports right foot pain that started this past Sunday that has progressively gotten worse even when the bed linen touched her great toe the pain was worse. Pain is like throbbing "tooth ache". Pt denies injury to right foot.

## 2012-03-06 NOTE — Discharge Instructions (Signed)
Gout Gout is an inflammatory condition (arthritis) caused by a buildup of uric acid crystals in the joints. Uric acid is a chemical that is normally present in the blood. Under some circumstances, uric acid can form into crystals in your joints. This causes joint redness, soreness, and swelling (inflammation). Repeat attacks are common. Over time, uric acid crystals can form into masses (tophi) near a joint, causing disfigurement. Gout is treatable and often preventable. CAUSES  The disease begins with elevated levels of uric acid in the blood. Uric acid is produced by your body when it breaks down a naturally found substance called purines. This also happens when you eat certain foods such as meats and fish. Causes of an elevated uric acid level include:  Being passed down from parent to child (heredity).   Diseases that cause increased uric acid production (obesity, psoriasis, some cancers).   Excessive alcohol use.   Diet, especially diets rich in meat and seafood.   Medicines, including certain cancer-fighting drugs (chemotherapy), diuretics, and aspirin.   Chronic kidney disease. The kidneys are no longer able to remove uric acid well.   Problems with metabolism.  Conditions strongly associated with gout include:  Obesity.   High blood pressure.   High cholesterol.   Diabetes.  Not everyone with elevated uric acid levels gets gout. It is not understood why some people get gout and others do not. Surgery, joint injury, and eating too much of certain foods are some of the factors that can lead to gout. SYMPTOMS   An attack of gout comes on quickly. It causes intense pain with redness, swelling, and warmth in a joint.   Fever can occur.   Often, only one joint is involved. Certain joints are more commonly involved:   Base of the big toe.   Knee.   Ankle.   Wrist.   Finger.  Without treatment, an attack usually goes away in a few days to weeks. Between attacks, you  usually will not have symptoms, which is different from many other forms of arthritis. DIAGNOSIS  Your caregiver will suspect gout based on your symptoms and exam. Removal of fluid from the joint (arthrocentesis) is done to check for uric acid crystals. Your caregiver will give you a medicine that numbs the area (local anesthetic) and use a needle to remove joint fluid for exam. Gout is confirmed when uric acid crystals are seen in joint fluid, using a special microscope. Sometimes, blood, urine, and X-ray tests are also used. TREATMENT  There are 2 phases to gout treatment: treating the sudden onset (acute) attack and preventing attacks (prophylaxis). Treatment of an Acute Attack  Medicines are used. These include anti-inflammatory medicines or steroid medicines.   An injection of steroid medicine into the affected joint is sometimes necessary.   The painful joint is rested. Movement can worsen the arthritis.   You may use warm or cold treatments on painful joints, depending which works best for you.   Discuss the use of coffee, vitamin C, or cherries with your caregiver. These may be helpful treatment options.  Treatment to Prevent Attacks After the acute attack subsides, your caregiver may advise prophylactic medicine. These medicines either help your kidneys eliminate uric acid from your body or decrease your uric acid production. You may need to stay on these medicines for a very long time. The early phase of treatment with prophylactic medicine can be associated with an increase in acute gout attacks. For this reason, during the first few months   of treatment, your caregiver may also advise you to take medicines usually used for acute gout treatment. Be sure you understand your caregiver's directions. You should also discuss dietary treatment with your caregiver. Certain foods such as meats and fish can increase uric acid levels. Other foods such as dairy can decrease levels. Your caregiver  can give you a list of foods to avoid. HOME CARE INSTRUCTIONS   Do not take aspirin to relieve pain. This raises uric acid levels.   Only take over-the-counter or prescription medicines for pain, discomfort, or fever as directed by your caregiver.   Rest the joint as much as possible. When in bed, keep sheets and blankets off painful areas.   Keep the affected joint raised (elevated).   Use crutches if the painful joint is in your leg.   Drink enough water and fluids to keep your urine clear or pale yellow. This helps your body get rid of uric acid. Do not drink alcoholic beverages. They slow the passage of uric acid.   Follow your caregiver's dietary instructions. Pay careful attention to the amount of protein you eat. Your daily diet should emphasize fruits, vegetables, whole grains, and fat-free or low-fat milk products.   Maintain a healthy body weight.  SEEK MEDICAL CARE IF:   You have an oral temperature above 102 F (38.9 C).   You develop diarrhea, vomiting, or any side effects from medicines.   You do not feel better in 24 hours, or you are getting worse.  SEEK IMMEDIATE MEDICAL CARE IF:   Your joint becomes suddenly more tender and you have:   Chills.   An oral temperature above 102 F (38.9 C), not controlled by medicine.  MAKE SURE YOU:   Understand these instructions.   Will watch your condition.   Will get help right away if you are not doing well or get worse.  Document Released: 10/07/2000 Document Revised: 09/29/2011 Document Reviewed: 01/18/2010 Exit[employer] Patient Information 2012 [employer]. 

## 2012-03-08 ENCOUNTER — Other Ambulatory Visit: Payer: Self-pay | Admitting: *Deleted

## 2012-03-08 ENCOUNTER — Telehealth: Payer: Self-pay | Admitting: *Deleted

## 2012-03-08 DIAGNOSIS — D473 Essential (hemorrhagic) thrombocythemia: Secondary | ICD-10-CM

## 2012-03-08 NOTE — Telephone Encounter (Signed)
Pt called wanting to inform Dr. Dalene Carrow re:  Pt developed pain in right foot over weekend.   Pt went to Urgent Care on 5/14  And was diagnosed with having gout.   Pt was given  Colchicine,  Indomethacin, and Hydrocodone.   Pt saw her primary Dr. Allyne Gee yesterday 03/07/12.   No changing in meds per primary.    Pt wanted to let Dr. Dalene Carrow know of her symptoms since pt read that Hydrea could cause gout. Pt's   Phone     [phone].

## 2012-03-08 NOTE — Telephone Encounter (Signed)
Spoke with pt and informed her re: Per Dr. Dalene Carrow ,  Not necessarily that hydrea causing gout problem as pt has been doing ok on this med for awhile.   Instructed pt to con with Hydrea 500 mg daily as per md.   Gave pt date and time for lab in 2 weeks  03/21/12 at 0730 am.   Pt voiced understanding.

## 2012-03-12 NOTE — ED Provider Notes (Signed)
Medical screening examination/treatment/procedure(s) were performed by resident physician or non-physician practitioner and as supervising physician I was immediately available for consultation/collaboration.   Roneshia Drew DOUGLAS MD.    Xochilth Standish D Yeilin Zweber, MD 03/12/12 1851 

## 2012-03-21 ENCOUNTER — Other Ambulatory Visit (HOSPITAL_BASED_OUTPATIENT_CLINIC_OR_DEPARTMENT_OTHER): Payer: BC Managed Care – PPO | Admitting: Lab

## 2012-03-21 DIAGNOSIS — D473 Essential (hemorrhagic) thrombocythemia: Secondary | ICD-10-CM

## 2012-03-21 DIAGNOSIS — D47Z9 Other specified neoplasms of uncertain behavior of lymphoid, hematopoietic and related tissue: Secondary | ICD-10-CM

## 2012-03-21 LAB — CBC WITH DIFFERENTIAL/PLATELET
Basophils Absolute: 0 10*3/uL (ref 0.0–0.1)
Eosinophils Absolute: 0.2 10*3/uL (ref 0.0–0.5)
HGB: 13.2 g/dL (ref 11.6–15.9)
MCV: 100.8 fL (ref 79.5–101.0)
MONO#: 0.4 10*3/uL (ref 0.1–0.9)
MONO%: 6.8 % (ref 0.0–14.0)
NEUT#: 3.2 10*3/uL (ref 1.5–6.5)
RBC: 3.8 10*6/uL (ref 3.70–5.45)
RDW: 12.1 % (ref 11.2–14.5)
WBC: 5.3 10*3/uL (ref 3.9–10.3)
lymph#: 1.4 10*3/uL (ref 0.9–3.3)
nRBC: 0 % (ref 0–0)

## 2012-03-22 ENCOUNTER — Telehealth: Payer: Self-pay | Admitting: *Deleted

## 2012-03-22 NOTE — Telephone Encounter (Signed)
Called pt at work and informed pt re:  Labs good;  Continue with  Hydrea 500 mg po daily as per md.   Confirmed date and time for f/u on 05/01/12.   Pt voiced understanding.

## 2012-03-27 ENCOUNTER — Other Ambulatory Visit: Payer: Self-pay | Admitting: Hematology and Oncology

## 2012-03-27 ENCOUNTER — Telehealth: Payer: Self-pay | Admitting: Hematology and Oncology

## 2012-03-27 NOTE — Telephone Encounter (Signed)
Per 6/4 pof moved appt forward for 6/27 @ 11:30 am. lmonvm for pt re change w/new d/t for 6/27 @ 11 am. June schedule mailed today.

## 2012-04-10 ENCOUNTER — Other Ambulatory Visit: Payer: Self-pay | Admitting: *Deleted

## 2012-04-10 DIAGNOSIS — D473 Essential (hemorrhagic) thrombocythemia: Secondary | ICD-10-CM

## 2012-04-10 MED ORDER — HYDROXYUREA 500 MG PO CAPS: 500.0000 mg | ORAL_CAPSULE | Freq: Every day | ORAL | Status: DC

## 2012-04-19 ENCOUNTER — Encounter: Payer: Self-pay | Admitting: Hematology and Oncology

## 2012-04-19 ENCOUNTER — Other Ambulatory Visit (HOSPITAL_BASED_OUTPATIENT_CLINIC_OR_DEPARTMENT_OTHER): Payer: BC Managed Care – PPO | Admitting: Lab

## 2012-04-19 ENCOUNTER — Telehealth: Payer: Self-pay | Admitting: Hematology and Oncology

## 2012-04-19 ENCOUNTER — Ambulatory Visit (HOSPITAL_BASED_OUTPATIENT_CLINIC_OR_DEPARTMENT_OTHER): Payer: BC Managed Care – PPO | Admitting: Hematology and Oncology

## 2012-04-19 VITALS — BP 135/77 | HR 80 | Temp 97.0°F | Ht 61.0 in | Wt 236.8 lb

## 2012-04-19 DIAGNOSIS — D473 Essential (hemorrhagic) thrombocythemia: Secondary | ICD-10-CM

## 2012-04-19 DIAGNOSIS — I10 Essential (primary) hypertension: Secondary | ICD-10-CM | POA: Insufficient documentation

## 2012-04-19 LAB — CBC WITH DIFFERENTIAL/PLATELET
BASO%: 0.8 % (ref 0.0–2.0)
Eosinophils Absolute: 0.2 10*3/uL (ref 0.0–0.5)
HCT: 37.8 % (ref 34.8–46.6)
HGB: 13.1 g/dL (ref 11.6–15.9)
LYMPH%: 31.9 % (ref 14.0–49.7)
MCHC: 34.7 g/dL (ref 31.5–36.0)
MONO#: 0.5 10*3/uL (ref 0.1–0.9)
NEUT#: 2.9 10*3/uL (ref 1.5–6.5)
NEUT%: 53.7 % (ref 38.4–76.8)
Platelets: 441 10*3/uL — ABNORMAL HIGH (ref 145–400)
WBC: 5.5 10*3/uL (ref 3.9–10.3)
lymph#: 1.7 10*3/uL (ref 0.9–3.3)

## 2012-04-19 LAB — BASIC METABOLIC PANEL
CO2: 30 mEq/L (ref 19–32)
Calcium: 9.4 mg/dL (ref 8.4–10.5)
Chloride: 100 mEq/L (ref 96–112)
Creatinine, Ser: 1.11 mg/dL — ABNORMAL HIGH (ref 0.50–1.10)
Glucose, Bld: 95 mg/dL (ref 70–99)

## 2012-04-19 NOTE — Patient Instructions (Signed)
Sara [name]  [id number]  [facility] Discharge Instructions  RECOMMENDATIONS MADE BY THE CONSULTANT AND ANY TEST RESULTS WILL BE SENT TO YOUR REFERRING DOCTOR.   EXAM FINDINGS BY MD TODAY AND SIGNS AND SYMPTOMS TO REPORT TO CLINIC OR PRIMARY MD:   Your current list of medications are: Current Outpatient Prescriptions  Medication Sig Dispense Refill  . ALPRAZolam (XANAX) 0.5 MG tablet Take 0.5 mg by mouth 2 (two) times daily as needed.      . Amlodipine-Valsartan-HCTZ (EXFORGE HCT) 10-320-25 MG TABS Take 1 tablet by mouth daily.      Marland Kitchen aspirin 81 MG tablet Take 81 mg by mouth daily.      Marland Kitchen atorvastatin (LIPITOR) 10 MG tablet Take 10 mg by mouth daily.      . carvedilol (COREG) 6.25 MG tablet Take 6.25 mg by mouth 2 (two) times daily with a meal.      . Cholecalciferol (VITAMIN D3) 3000 UNITS TABS Take 2,000 Units by mouth daily.      . clopidogrel (PLAVIX) 75 MG tablet Take 75 mg by mouth daily.      . colchicine 0.6 MG tablet Take 1 tablet (0.6 mg total) by mouth 2 (two) times daily.  14 tablet  0  . hydroxyurea (HYDREA) 500 MG capsule Take 1 capsule (500 mg total) by mouth daily. May take with food to minimize GI side effects.  100 capsule  1  . indomethacin (INDOCIN) 25 MG capsule Take 1 capsule (25 mg total) by mouth 2 (two) times daily with a meal.  20 capsule  0  . sitaGLIPtan-metformin (JANUMET) 50-500 MG per tablet Take 1 tablet by mouth daily.         INSTRUCTIONS GIVEN AND DISCUSSED:   SPECIAL INSTRUCTIONS/FOLLOW-UP:  See above.  I acknowledge that I have been informed and understand all the instructions given to me and received a copy. I do not have any more questions at this time, but understand that I may call the [facility] at [phone] during business hours should I have any further questions or need assistance in obtaining follow-up care.

## 2012-04-19 NOTE — Telephone Encounter (Signed)
appts made and printed for pt aom °

## 2012-04-19 NOTE — Progress Notes (Signed)
CC:   Sara [name], M.D. Sara [name], M.D.  IDENTIFYING STATEMENT:  The patient is a 56 year old woman with essential thrombocytosis who presents for followup.  INTERVAL HISTORY:  The patient is seen 6 months ago and is tolerating Hydrea.  Essentially her labs have remained stable in the last six months.  She tells me she was diagnosed with gout and was on colchicine.  She is now on allopurinol. Most up to date CBC on 04/19/2012 notes a white cell count of 5.5, hemoglobin 13.1, hematocrit 37.8, platelets 441 (394).  MEDICATIONS: 1. Hydrea 500 mg daily. 2. Aspirin 81 mg daily. 3. Exforge. 4. Bystolic. 5. Carvedilol. Rest of medicines reviewed and updated.  ALLERGIES:  Sulfa.  PHYSICAL EXAM:  General: Patient is a well-appearing, well-nourished woman in no distress.  Vitals: Pulse 80, blood pressure 135/77, temperature 97, respirations 18, weight 236 pounds.  HEENT:  Head is atraumatic, normocephalic.  Sclerae anicteric.  Mouth moist.  Chest: Clear.  CVS:  Unremarkable.  Abdomen:  Soft.  Extremities:  No edema.  LAB DATA:  CBC as above.  IMPRESSION AND PLAN:  Ms. Swoveland is a 56 year old woman with essential thrombocytosis and JAK 2 mutation positivity.  Her platelets are slightly higher than when last tested.  However, I would like for her to stay on the Hydrea at the current dose and have her repeat blood work in a month's time.  She has a clinic visit in 6 months' time.    ______________________________ Laurice Record, M.D. LIO/MEDQ  D:  04/19/2012  T:  04/19/2012  Job:  [id number]

## 2012-05-01 ENCOUNTER — Other Ambulatory Visit: Payer: BC Managed Care – PPO | Admitting: Lab

## 2012-05-01 ENCOUNTER — Ambulatory Visit: Payer: BC Managed Care – PPO | Admitting: Hematology and Oncology

## 2012-05-22 ENCOUNTER — Other Ambulatory Visit (HOSPITAL_BASED_OUTPATIENT_CLINIC_OR_DEPARTMENT_OTHER): Payer: BC Managed Care – PPO | Admitting: Lab

## 2012-05-22 ENCOUNTER — Telehealth: Payer: Self-pay | Admitting: Nurse Practitioner

## 2012-05-22 DIAGNOSIS — D473 Essential (hemorrhagic) thrombocythemia: Secondary | ICD-10-CM

## 2012-05-22 LAB — CBC WITH DIFFERENTIAL/PLATELET
BASO%: 0.8 % (ref 0.0–2.0)
Basophils Absolute: 0 10*3/uL (ref 0.0–0.1)
EOS%: 5.3 % (ref 0.0–7.0)
HCT: 38.2 % (ref 34.8–46.6)
HGB: 13.1 g/dL (ref 11.6–15.9)
MCH: 33.9 pg (ref 25.1–34.0)
MCHC: 34.3 g/dL (ref 31.5–36.0)
MCV: 99 fL (ref 79.5–101.0)
MONO%: 10.6 % (ref 0.0–14.0)
NEUT%: 57 % (ref 38.4–76.8)
RDW: 12.4 % (ref 11.2–14.5)

## 2012-05-22 NOTE — Telephone Encounter (Signed)
Called patient- left message requesting return call.  Need to instruct to continue Hydrea 500mg  daily and recheck CBC in one month.  POF to schedulers.

## 2012-05-28 ENCOUNTER — Telehealth: Payer: Self-pay | Admitting: Hematology and Oncology

## 2012-05-28 NOTE — Telephone Encounter (Signed)
Called pt re 8/27 lb appt and was asked by the person that answered the phone to call her at work. Called pt's work number and lmonvm for appt for 8/27. Pt was personally identified on vm. Schedule mailed.

## 2012-06-19 ENCOUNTER — Other Ambulatory Visit: Payer: BC Managed Care – PPO | Admitting: Lab

## 2012-06-19 ENCOUNTER — Telehealth: Payer: Self-pay

## 2012-06-19 DIAGNOSIS — D473 Essential (hemorrhagic) thrombocythemia: Secondary | ICD-10-CM

## 2012-06-19 LAB — CBC WITH DIFFERENTIAL/PLATELET
Basophils Absolute: 0.1 10*3/uL (ref 0.0–0.1)
EOS%: 5.8 % (ref 0.0–7.0)
HGB: 12.5 g/dL (ref 11.6–15.9)
MCH: 34 pg (ref 25.1–34.0)
MONO%: 9.2 % (ref 0.0–14.0)
NEUT#: 3.2 10*3/uL (ref 1.5–6.5)
RBC: 3.69 10*6/uL — ABNORMAL LOW (ref 3.70–5.45)
RDW: 12.3 % (ref 11.2–14.5)
lymph#: 1.4 10*3/uL (ref 0.9–3.3)

## 2012-06-19 NOTE — Telephone Encounter (Signed)
Message copied by Gaylord Shih on Tue Jun 19, 2012  5:04 PM ------      Message from: Arlan Organ I      Created: Tue Jun 19, 2012  4:35 PM       Continue with hydrea q daily.  Her platelets are going up.  Check CBC in one month

## 2012-06-19 NOTE — Telephone Encounter (Signed)
Message copied by Gaylord Shih on Tue Jun 19, 2012  4:44 PM ------      Message from: Arlan Organ I      Created: Tue Jun 19, 2012  4:35 PM       Continue with hydrea q daily.  Her platelets are going up.  Check CBC in one month

## 2012-06-19 NOTE — Telephone Encounter (Signed)
Continue hydrea daily, CBC in 1 month

## 2012-06-21 ENCOUNTER — Telehealth: Payer: Self-pay | Admitting: Hematology and Oncology

## 2012-06-21 NOTE — Telephone Encounter (Signed)
S/w relative re appt for 9/27.

## 2012-06-22 ENCOUNTER — Telehealth: Payer: Self-pay | Admitting: *Deleted

## 2012-06-22 NOTE — Telephone Encounter (Signed)
Pt called wanting to know results of lab and instructions from md.   Spoke with pt and informed pt re:  Continue with  Hydrea  500 mg  PO  Daily ;  Platelets going up  As per md's instructions.    Gave pt date and time for next lab on  07/20/12  At  0800.    Pt voiced understanding.

## 2012-07-20 ENCOUNTER — Other Ambulatory Visit: Payer: BC Managed Care – PPO | Admitting: Lab

## 2012-07-24 ENCOUNTER — Telehealth: Payer: Self-pay | Admitting: *Deleted

## 2012-07-24 ENCOUNTER — Other Ambulatory Visit (HOSPITAL_BASED_OUTPATIENT_CLINIC_OR_DEPARTMENT_OTHER): Payer: BC Managed Care – PPO | Admitting: Lab

## 2012-07-24 ENCOUNTER — Other Ambulatory Visit: Payer: Self-pay | Admitting: *Deleted

## 2012-07-24 DIAGNOSIS — D473 Essential (hemorrhagic) thrombocythemia: Secondary | ICD-10-CM

## 2012-07-24 LAB — CBC WITH DIFFERENTIAL/PLATELET
BASO%: 0.5 % (ref 0.0–2.0)
MCHC: 34.4 g/dL (ref 31.5–36.0)
MONO#: 0.5 10*3/uL (ref 0.1–0.9)
RBC: 3.9 10*6/uL (ref 3.70–5.45)
RDW: 13 % (ref 11.2–14.5)
WBC: 5.1 10*3/uL (ref 3.9–10.3)
lymph#: 1.7 10*3/uL (ref 0.9–3.3)

## 2012-07-24 NOTE — Telephone Encounter (Signed)
Spoke with pt and instructed pt re:  Continue with Hydrea 500 mg po daily as per md.   Gave pt date and time for lab on  09/25/12.   Pt voiced understanding.

## 2012-09-25 ENCOUNTER — Other Ambulatory Visit: Payer: BC Managed Care – PPO

## 2012-10-13 ENCOUNTER — Telehealth: Payer: Self-pay | Admitting: Oncology

## 2012-10-13 NOTE — Telephone Encounter (Signed)
LVOM for pt to return call in re r/s appt.  Dr. Caralyn Guile

## 2012-10-16 ENCOUNTER — Other Ambulatory Visit: Payer: Self-pay

## 2012-10-22 ENCOUNTER — Other Ambulatory Visit: Payer: Self-pay | Admitting: *Deleted

## 2012-10-22 DIAGNOSIS — D473 Essential (hemorrhagic) thrombocythemia: Secondary | ICD-10-CM

## 2012-10-22 MED ORDER — HYDROXYUREA 500 MG PO CAPS: 500.0000 mg | ORAL_CAPSULE | Freq: Every day | ORAL | Status: DC

## 2012-10-27 ENCOUNTER — Encounter: Payer: Self-pay | Admitting: *Deleted

## 2012-11-01 ENCOUNTER — Telehealth: Payer: Self-pay | Admitting: Oncology

## 2012-11-01 NOTE — Telephone Encounter (Signed)
Moved 1/22 appt to 1/23 - poof. Also reassignment pt - LO to DM. Pt scheduled w/[facility]. S/w pt's sister re new d/t but also asked sister to have pt call me directly re additonal info about this appt. Schedule mailed.

## 2012-11-12 ENCOUNTER — Other Ambulatory Visit (HOSPITAL_BASED_OUTPATIENT_CLINIC_OR_DEPARTMENT_OTHER): Payer: BC Managed Care – PPO | Admitting: Lab

## 2012-11-12 ENCOUNTER — Encounter: Payer: Self-pay | Admitting: Family

## 2012-11-12 ENCOUNTER — Ambulatory Visit (HOSPITAL_BASED_OUTPATIENT_CLINIC_OR_DEPARTMENT_OTHER): Payer: BC Managed Care – PPO | Admitting: Family

## 2012-11-12 ENCOUNTER — Telehealth: Payer: Self-pay | Admitting: Oncology

## 2012-11-12 VITALS — BP 130/82 | HR 82 | Temp 98.9°F | Resp 20 | Ht 61.0 in | Wt 232.8 lb

## 2012-11-12 DIAGNOSIS — D473 Essential (hemorrhagic) thrombocythemia: Secondary | ICD-10-CM

## 2012-11-12 LAB — CBC WITH DIFFERENTIAL/PLATELET
BASO%: 0.5 % (ref 0.0–2.0)
Basophils Absolute: 0 10*3/uL (ref 0.0–0.1)
HCT: 38.5 % (ref 34.8–46.6)
HGB: 13 g/dL (ref 11.6–15.9)
MONO#: 0.4 10*3/uL (ref 0.1–0.9)
NEUT%: 62 % (ref 38.4–76.8)
RDW: 12.9 % (ref 11.2–14.5)
WBC: 5.5 10*3/uL (ref 3.9–10.3)
lymph#: 1.4 10*3/uL (ref 0.9–3.3)

## 2012-11-12 NOTE — Patient Instructions (Addendum)
Please contact us at [phone] if you have any questions or concerns. 

## 2012-11-12 NOTE — Progress Notes (Signed)
Patient ID: Sara [name], female   DOB: [birth year], 57 y.o.   MRN: [id number] CSN: [id number]  CC: Sara N. Allyne Gee, MD Sara Batty, MD  Sara Ard, MD   Problem List: Sara [name] is a 58 y.o. African-American female with a problem list consisting of:  1.  Essential thrombocytosis with positive JAK2 mutation.  Diagnosed in 11/2008.  The patient underwent bone marrow biopsy on 01/20/2009.  Platelets were 810 on 01/20/2009. 2.  Gout - Sara [name] is currently in a gout research study with Sara [name] in which she is receiving an unknown dose of Allopurinol and another unknown medication or placebo. 3.  HTN 4.  DM 5.  Colonoscopy on 06/01/2012 showed that the patient had a polypectomy x 2.  The first polyp was in the distal transverse colon identified as a tubular adenoma; no high grade dysplasia seen.  The second polyp was 15 cm in the colon and was identified as a tubular adenoma; no high grade dysplasia seen.  Repeat colonoscopy in 1 year recommended. 6. Mammogram at [facility] on 02/13/2012 - these records have been requested.    Sara [name] and I saw Sara [name] for follow up today of her essential thrombocytosis with JAK 2 mutation positivity.  She is accompanied by her husband, Sara [name] for today's appointment. Sara [name] is transitioning her care from Sara [name] to Sara [name], but Sara [name] is out of the office today. Sara [name] was last seen by Sara [name] on 04/19/2012. Since her last office visit, Sara [name] states that she has been doing well and is without complaint.  Her platelets are slightly higher than when last tested - from 441 on 04/19/2012 to 487 today.  Her platelets have been as high as 810 on 01/20/2009.  Sara [name] denies any symptomatology during today's visit including any unusual bleeding, bruising, night sweats, fever, chills, headaches, N/V/D or constipation. Sara [name] is physically active and has a good appetite. She  works for [employer] in [employer] for the past 28 years.  Sara [name] had an extensive discussion about thrombocytosis and Hydroxyurea with the patient and her husband.   Past Medical History: Past Medical History  Diagnosis Date  . Thrombocytosis   . Hypertension   . Diabetes mellitus   . Gout     Surgical History: Past Surgical History  Procedure Date  . Breast reduction surgery 1993    S/P Bilateral breast reduction  . Cesarean section 1980, 1986    Times  Two.  . Breast cyst excision     S/P Benign Right Breast Cyst Removal.  . Partial hysterectomy   . Extremity cyst excision     left    Current Medications: Current Outpatient Prescriptions  Medication Sig Dispense Refill  . allopurinol (ZYLOPRIM) 100 MG tablet Take 100 mg by mouth daily. Research study with Sara [name]- pt may be receiving placebo      . Amlodipine-Valsartan-HCTZ (EXFORGE HCT) 10-320-25 MG TABS Take 1 tablet by mouth daily.      Marland Kitchen aspirin 81 MG tablet Take 81 mg by mouth daily.      Marland Kitchen atorvastatin (LIPITOR) 10 MG tablet Take 10 mg by mouth daily.      . carvedilol (COREG) 6.25 MG tablet Take 6.25 mg by mouth 2 (two) times daily with a meal.      . Cholecalciferol (VITAMIN D3) 3000 UNITS TABS Take 2,000 Units by mouth daily.      Marland Kitchen  hydroxyurea (HYDREA) 500 MG capsule Take 1 capsule (500 mg total) by mouth daily. May take with food to minimize GI side effects.  100 capsule  0  . sitaGLIPtan-metformin (JANUMET) 50-500 MG per tablet Take 1 tablet by mouth daily.      . clopidogrel (PLAVIX) 75 MG tablet Take 75 mg by mouth daily.        Allergies: Allergies  Allergen Reactions  . Sulfa Antibiotics Itching    Family History: Family History  Problem Relation Age of Onset  . Breast cancer Maternal Aunt     2 maternal aunts had breast cancer.  Marland Kitchen Heart failure Mother   . Diabetes Father   . Diabetes Sister   . Hypertension Sister   . Breast cancer Maternal  Grandmother   . Hypertension Sister   . Hypertension Sister     Social History: History  Substance Use Topics  . Smoking status: Never Smoker   . Smokeless tobacco: Never Used  . Alcohol Use: No    Review of Systems: 10 Point review of systems was completed and is negative except as noted above.   Physical Exam:   Blood pressure 130/82, pulse 82, temperature 98.9 F (37.2 C), temperature source Oral, resp. rate 20, height 5\' 1"  (1.549 m), weight 232 lb 12.8 oz (105.597 kg).  General appearance: Alert, cooperative, well nourished, no apparent distress Head: Normocephalic, without obvious abnormality, atraumatic Eyes: Conjunctivae/corneas clear, PERRLA, EOMI Nose: Nares, septum and mucosa are normal, no drainage or sinus tenderness. Neck: No adenopathy, supple, symmetrical, trachea midline, thyroid not enlarged, no tenderness Resp: Clear to auscultation bilaterally Cardio: Regular rate and rhythm, S1, S2 normal, no murmur, click, rub or gallop GI: Soft, distended, non-tender, hypoactive bowel sounds, no organomegaly Extremities: Extremities normal, atraumatic, no cyanosis or edema Lymph nodes: Cervical, supraclavicular, and axillary nodes normal Neurologic: Grossly normal   Laboratory Data: Results for orders placed in visit on 11/12/12 (from the past 48 hour(s))  CBC WITH DIFFERENTIAL     Status: Abnormal   Collection Time   11/12/12  8:48 AM      Component Value Range Comment   WBC 5.5  3.9 - 10.3 10e3/uL    NEUT# 3.4  1.5 - 6.5 10e3/uL    HGB 13.0  11.6 - 15.9 g/dL    HCT 16.1  09.6 - 04.5 %    Platelets 487 (*) 145 - 400 10e3/uL    MCV 97.0  79.5 - 101.0 fL    MCH 32.7  25.1 - 34.0 pg    MCHC 33.8  31.5 - 36.0 g/dL    RBC 4.09  8.11 - 9.14 10e6/uL    RDW 12.9  11.2 - 14.5 %    lymph# 1.4  0.9 - 3.3 10e3/uL    MONO# 0.4  0.1 - 0.9 10e3/uL    Eosinophils Absolute 0.2  0.0 - 0.5 10e3/uL    Basophils Absolute 0.0  0.0 - 0.1 10e3/uL    NEUT% 62.0  38.4 - 76.8 %     LYMPH% 26.4  14.0 - 49.7 %    MONO% 7.1  0.0 - 14.0 %    EOS% 4.0  0.0 - 7.0 %    BASO% 0.5  0.0 - 2.0 %    nRBC 0  0 - 0 %    Procedures: 1.  Colonoscopy on 06/01/2012 showed that the patient had a polypectomy x 2.  The first polyp was in the distal transverse colon identified as a tubular adenoma; no  high grade dysplasia seen.  The second polyp was 15 cm in the colon and was identified as a tubular adenoma; no high grade dysplasia seen.  Repeat colonoscopy in 1 year recommended.   Imaging Studies: 1.  Chest x-ray, 2 view  on 12/07/2010 showed no active lung disease.   Impression/Plan: Mrs. Cariana [name] is a 57 year-old African-American female with essential thrombocytosis and JAK 2 mutation positivity.   She takes Hyroxyurea 500 mg PO daily and her platelet counts have remained under 500,000 with this regimen.  Sara [name] tolerates the Hydroxyurea well.  We will check labs (CBC and Morphology) monthly.  We plan to see Sara [name] in 4 months (03/11/2013) at which time we will check CBC, Morphology, CMP and LDH.  Mr. And Mrs. Betterton were encouraged to contact us in the interim if they have any questions or concerns.  Extensive records review and requests were completed in preparation for the transitioning of Sara [name]' care to our service. Thirty minutes was spent face to face with this patient.    Larina Bras, NP-C 11/12/2012, 12:34 PM

## 2012-11-12 NOTE — Telephone Encounter (Signed)
Gave pt appt for labs February , March and April then MD visit on May with labs

## 2012-11-14 ENCOUNTER — Ambulatory Visit: Payer: BC Managed Care – PPO | Admitting: Family

## 2012-11-14 ENCOUNTER — Other Ambulatory Visit: Payer: BC Managed Care – PPO | Admitting: Lab

## 2012-11-15 ENCOUNTER — Other Ambulatory Visit: Payer: BC Managed Care – PPO | Admitting: Lab

## 2012-11-15 ENCOUNTER — Ambulatory Visit: Payer: BC Managed Care – PPO | Admitting: Family

## 2012-12-07 ENCOUNTER — Telehealth: Payer: Self-pay | Admitting: Oncology

## 2012-12-07 NOTE — Telephone Encounter (Signed)
returned pts called and confirmed appt

## 2012-12-10 ENCOUNTER — Encounter: Payer: Self-pay | Admitting: Oncology

## 2012-12-10 ENCOUNTER — Other Ambulatory Visit (HOSPITAL_BASED_OUTPATIENT_CLINIC_OR_DEPARTMENT_OTHER): Payer: BC Managed Care – PPO

## 2012-12-10 DIAGNOSIS — D473 Essential (hemorrhagic) thrombocythemia: Secondary | ICD-10-CM

## 2012-12-10 LAB — CBC WITH DIFFERENTIAL/PLATELET
Eosinophils Absolute: 0.3 10*3/uL (ref 0.0–0.5)
MONO#: 0.4 10*3/uL (ref 0.1–0.9)
NEUT#: 3.7 10*3/uL (ref 1.5–6.5)
Platelets: 505 10*3/uL — ABNORMAL HIGH (ref 145–400)
RBC: 4.15 10*6/uL (ref 3.70–5.45)
RDW: 12.8 % (ref 11.2–14.5)
WBC: 5.9 10*3/uL (ref 3.9–10.3)
lymph#: 1.5 10*3/uL (ref 0.9–3.3)
nRBC: 0 % (ref 0–0)

## 2012-12-10 LAB — MORPHOLOGY
PLT EST: INCREASED
RBC Comments: NORMAL

## 2012-12-10 NOTE — Progress Notes (Signed)
This patient has been on hydroxyurea 500 mg daily for essential thrombocythemia with positive JAK2 mutation.  Platelet count on 12/10/2012 was 505,000. On 11/12/2012, platelet count was 487,000. On 07/24/2012, platelet count was 424,000.  We will increase the hydroxyurea dose from 7 capsules per week to 9 capsules per week. The patient can take 1000 mg on Tuesdays and Fridays.  We are currently checking monthly CBCs. The patient has an appointment to see me in May.

## 2012-12-12 ENCOUNTER — Telehealth: Payer: Self-pay | Admitting: Medical Oncology

## 2012-12-12 NOTE — Telephone Encounter (Signed)
I left a message and asked the pt to call me to discuss a dose change in her hydrea.

## 2012-12-12 NOTE — Telephone Encounter (Signed)
I spoke with pt to let her know that her platelet count on 12/10/2012 was 505,000. Dr. Arline Asp would like for her to take 500 mg hydrea daily except for Tuesdays and Friday take it twice a day. Pt verbalized these instructions back to me.

## 2013-01-07 ENCOUNTER — Other Ambulatory Visit (HOSPITAL_BASED_OUTPATIENT_CLINIC_OR_DEPARTMENT_OTHER): Payer: BC Managed Care – PPO

## 2013-01-07 DIAGNOSIS — D473 Essential (hemorrhagic) thrombocythemia: Secondary | ICD-10-CM

## 2013-01-07 LAB — CBC WITH DIFFERENTIAL/PLATELET
BASO%: 0.7 % (ref 0.0–2.0)
EOS%: 4.2 % (ref 0.0–7.0)
MCH: 32.8 pg (ref 25.1–34.0)
MCHC: 33.6 g/dL (ref 31.5–36.0)
MONO%: 11.4 % (ref 0.0–14.0)
RDW: 13.2 % (ref 11.2–14.5)
lymph#: 1.6 10*3/uL (ref 0.9–3.3)

## 2013-01-07 LAB — MORPHOLOGY: PLT EST: INCREASED

## 2013-01-30 ENCOUNTER — Encounter: Payer: Self-pay | Admitting: *Deleted

## 2013-01-30 ENCOUNTER — Encounter: Payer: Self-pay | Admitting: Cardiovascular Disease

## 2013-02-07 ENCOUNTER — Encounter: Payer: Self-pay | Admitting: Medical Oncology

## 2013-02-07 ENCOUNTER — Other Ambulatory Visit: Payer: Self-pay | Admitting: Medical Oncology

## 2013-02-07 MED ORDER — HYDROXYUREA 500 MG PO CAPS: ORAL_CAPSULE | ORAL | Status: DC

## 2013-02-11 ENCOUNTER — Other Ambulatory Visit (HOSPITAL_BASED_OUTPATIENT_CLINIC_OR_DEPARTMENT_OTHER): Payer: BC Managed Care – PPO

## 2013-02-11 ENCOUNTER — Encounter: Payer: Self-pay | Admitting: Oncology

## 2013-02-11 DIAGNOSIS — D473 Essential (hemorrhagic) thrombocythemia: Secondary | ICD-10-CM

## 2013-02-11 LAB — CBC WITH DIFFERENTIAL/PLATELET
Basophils Absolute: 0 10*3/uL (ref 0.0–0.1)
EOS%: 4.1 % (ref 0.0–7.0)
Eosinophils Absolute: 0.2 10*3/uL (ref 0.0–0.5)
HCT: 38.5 % (ref 34.8–46.6)
HGB: 13.1 g/dL (ref 11.6–15.9)
MCH: 33 pg (ref 25.1–34.0)
MCV: 97 fL (ref 79.5–101.0)
MONO%: 8.8 % (ref 0.0–14.0)
NEUT#: 2.6 10*3/uL (ref 1.5–6.5)
NEUT%: 54.9 % (ref 38.4–76.8)
Platelets: 459 10*3/uL — ABNORMAL HIGH (ref 145–400)
RDW: 13.3 % (ref 11.2–14.5)

## 2013-02-11 LAB — MORPHOLOGY

## 2013-02-14 ENCOUNTER — Telehealth: Payer: Self-pay | Admitting: Oncology

## 2013-02-14 NOTE — Telephone Encounter (Signed)
s,w pt and advised on appt d/t change to 5.27.14.Marland KitchenMarland Kitchenpt ok and aware

## 2013-02-26 ENCOUNTER — Telehealth: Payer: Self-pay | Admitting: *Deleted

## 2013-02-26 NOTE — Telephone Encounter (Signed)
sw pt informed her that DSM needed to change her appt time. gv appt for 03/19/13 3:30pm lab and 4pm ov....td

## 2013-03-11 ENCOUNTER — Ambulatory Visit: Payer: BC Managed Care – PPO | Admitting: Oncology

## 2013-03-11 ENCOUNTER — Other Ambulatory Visit: Payer: BC Managed Care – PPO | Admitting: Lab

## 2013-03-19 ENCOUNTER — Telehealth: Payer: Self-pay | Admitting: Oncology

## 2013-03-19 ENCOUNTER — Other Ambulatory Visit (HOSPITAL_BASED_OUTPATIENT_CLINIC_OR_DEPARTMENT_OTHER): Payer: BC Managed Care – PPO | Admitting: Lab

## 2013-03-19 ENCOUNTER — Other Ambulatory Visit: Payer: BC Managed Care – PPO | Admitting: Lab

## 2013-03-19 ENCOUNTER — Encounter: Payer: Self-pay | Admitting: Oncology

## 2013-03-19 ENCOUNTER — Ambulatory Visit (HOSPITAL_BASED_OUTPATIENT_CLINIC_OR_DEPARTMENT_OTHER): Payer: BC Managed Care – PPO | Admitting: Oncology

## 2013-03-19 ENCOUNTER — Ambulatory Visit: Payer: BC Managed Care – PPO | Admitting: Oncology

## 2013-03-19 VITALS — BP 142/83 | HR 79 | Temp 98.1°F | Resp 18 | Ht 61.0 in | Wt 234.4 lb

## 2013-03-19 DIAGNOSIS — D473 Essential (hemorrhagic) thrombocythemia: Secondary | ICD-10-CM

## 2013-03-19 LAB — CBC WITH DIFFERENTIAL/PLATELET
Basophils Absolute: 0.1 10*3/uL (ref 0.0–0.1)
Eosinophils Absolute: 0.3 10*3/uL (ref 0.0–0.5)
HCT: 38.5 % (ref 34.8–46.6)
HGB: 13.3 g/dL (ref 11.6–15.9)
MCH: 34.3 pg — ABNORMAL HIGH (ref 25.1–34.0)
MCV: 99.6 fL (ref 79.5–101.0)
MONO%: 6.6 % (ref 0.0–14.0)
NEUT#: 5.2 10*3/uL (ref 1.5–6.5)
NEUT%: 66.2 % (ref 38.4–76.8)
RDW: 13.9 % (ref 11.2–14.5)
lymph#: 1.8 10*3/uL (ref 0.9–3.3)

## 2013-03-19 LAB — COMPREHENSIVE METABOLIC PANEL (CC13)
ALT: 23 U/L (ref 0–55)
AST: 24 U/L (ref 5–34)
CO2: 27 mEq/L (ref 22–29)
Calcium: 9.3 mg/dL (ref 8.4–10.4)
Chloride: 102 mEq/L (ref 98–107)
Creatinine: 1.2 mg/dL — ABNORMAL HIGH (ref 0.6–1.1)
Sodium: 139 mEq/L (ref 136–145)
Total Protein: 7.4 g/dL (ref 6.4–8.3)

## 2013-03-19 LAB — LACTATE DEHYDROGENASE (CC13): LDH: 262 U/L — ABNORMAL HIGH (ref 125–245)

## 2013-03-19 LAB — MORPHOLOGY: PLT EST: INCREASED

## 2013-03-19 NOTE — Telephone Encounter (Signed)
gve the pt her appts for mnthly lab from July-sept along with the md f/u appt.

## 2013-03-19 NOTE — Progress Notes (Signed)
This office note has been dictated.  #[id number]

## 2013-03-20 NOTE — Progress Notes (Signed)
CC:   Sara [name], M.D.  PROBLEM LIST: 1. Essential thrombocythemia.  Diagnosed in 2010.  JAK2 mutation was     present.  The patient underwent a bone marrow aspirate and biopsy     on 01/20/2009 which yielded a specimen that was suboptimal for     evaluation.  However, megakaryocytes were abundant with normal     morphology.  There was no clustering or excess blasts seen.     Platelet count on 01/20/2009 was 881.  The patient is being treated     with aspirin and hydroxyurea. 2. Gout.  The patient is currently participating in a gout research     study. 3. Hypertension. 4. Diabetes mellitus. 5. Right renal artery stent placed in 2012 by Dr. Nanetta [name]. 6. Obesity. 7. Positive family history of breast cancer involving the patient's     paternal grandmother, 2 maternal aunts.  MEDICATIONS:  Reviewed and recorded. Current Outpatient Prescriptions  Medication Sig Dispense Refill  . allopurinol (ZYLOPRIM) 100 MG tablet Take 100 mg by mouth daily. Research study with Sara [name]- pt may be receiving placebo      . amLODipine-valsartan (EXFORGE) 10-160 MG per tablet Take 1 tablet by mouth daily.      Marland Kitchen aspirin 81 MG tablet Take 81 mg by mouth daily.      Marland Kitchen atorvastatin (LIPITOR) 10 MG tablet Take 10 mg by mouth daily.      . carvedilol (COREG) 6.25 MG tablet Take 6.25 mg by mouth 2 (two) times daily with a meal.      . cholecalciferol (VITAMIN D) 1000 UNITS tablet Take 1,000 Units by mouth daily.      . clopidogrel (PLAVIX) 75 MG tablet Take 75 mg by mouth daily.      . hydroxyurea (HYDREA) 500 MG capsule Take 500 mg by mouth daily. Take 2 capsules Friday and Tues 1 capsule all other days.May take with food to minimize GI side effects.      . sitaGLIPtan-metformin (JANUMET) 50-500 MG per tablet Take 1 tablet by mouth daily.       No current facility-administered medications for this visit.    TREATMENT PROGRAM: 1. Hydroxyurea 1000 mg daily as of 03/19/2013.  The patient was      apparently started on hydroxyurea at the time of diagnosis. 2. Aspirin 81 mg daily.  SMOKING HISTORY:  Patient denies any use of cigarettes or smokeless tobacco.  HISTORY:  Sara [name] is a 57 year old African American female being followed for the diagnosis of essential thrombocythemia.  The patient was last seen by [place] on 11/12/2012.  At that time, she was on hydroxyurea 500 mg daily.  On 12/10/2012 the platelet count was 505,000 and the dose was increased from 7 capsules per week to 9 capsules per week.  The patient is not having any problems with the hydroxyurea.  She has had no complications related to her elevated platelet count.  Over the past several weeks, the patient has had cough, congestion, apparently sinus infection and bronchitis.  On or about May 16th, she was given a course of antibiotics, a steroid shot, and medicine for her cough.  Her symptoms are slowly improving.  She had a low-grade fever of 99 at one time, but has been afebrile.  She continues to work.  PHYSICAL EXAMINATION:  The patient looks well, but is overweight. Weight is 234 pounds 6.4 ounces, height 5 feet 1 inch, body surface area 2.14 m2.  Blood pressure 142/83.  Other  vital signs are normal.  There is no scleral icterus.  Mouth and pharynx are benign.  There is no peripheral adenopathy palpable in the neck, supraclavicular, or axillary areas.  Breasts:  Not examined.  Heart and Lungs:  Normal.  Abdomen is obese, nontender, and soft; no organomegaly or masses palpable. Extremities:  No peripheral edema.  Neurologic:  Normal.  LABORATORY DATA:  Today, white count 7.88, ANC 5.2, hemoglobin 13.3, hematocrit 38.5, platelets 575,000.  Chemistries today were notable for an albumin of 3.4, creatinine 1.2, BUN 23, LDH 262, and glucose 117.   IMAGING STUDIES:  Chest x-ray, 2-view from 12/07/2010, was negative.   PROCEDURES:  Bone marrow aspirate and biopsy was carried out on 01/20/2009.   IMPRESSION AND  PLAN:  Sara [name] is currently on aspirin 81 mg daily and hydroxyurea 9 capsules per week.  She takes 500 mg daily for 5 days. She takes 1000 mg on Tuesdays and Fridays.  We are going to increase the hydroxyurea dose to 1000 mg daily.  We will continue to check CBC every month.  The patient is due for mammograms.  We will have her return in about 4 months at which time we will check CBC and chemistries.    ______________________________ Sara [name], M.D. DSM/MEDQ  D:  03/19/2013  T:  03/20/2013  Job:  [id number]

## 2013-04-11 ENCOUNTER — Telehealth: Payer: Self-pay | Admitting: Oncology

## 2013-04-11 NOTE — Telephone Encounter (Signed)
pt called to change time for lab to later.Marland KitchenMarland KitchenMarland KitchenDone

## 2013-04-16 ENCOUNTER — Other Ambulatory Visit (HOSPITAL_BASED_OUTPATIENT_CLINIC_OR_DEPARTMENT_OTHER): Payer: BC Managed Care – PPO

## 2013-04-16 ENCOUNTER — Other Ambulatory Visit: Payer: BC Managed Care – PPO | Admitting: Lab

## 2013-04-16 DIAGNOSIS — D473 Essential (hemorrhagic) thrombocythemia: Secondary | ICD-10-CM

## 2013-04-16 LAB — CBC WITH DIFFERENTIAL/PLATELET
BASO%: 0.4 % (ref 0.0–2.0)
EOS%: 1.8 % (ref 0.0–7.0)
HCT: 40 % (ref 34.8–46.6)
LYMPH%: 32.9 % (ref 14.0–49.7)
MCH: 33.8 pg (ref 25.1–34.0)
MCHC: 33.8 g/dL (ref 31.5–36.0)
MCV: 100 fL (ref 79.5–101.0)
MONO#: 0.4 10*3/uL (ref 0.1–0.9)
MONO%: 7.6 % (ref 0.0–14.0)
NEUT%: 57.3 % (ref 38.4–76.8)
Platelets: 441 10*3/uL — ABNORMAL HIGH (ref 145–400)

## 2013-05-09 ENCOUNTER — Other Ambulatory Visit: Payer: Self-pay | Admitting: *Deleted

## 2013-05-09 DIAGNOSIS — D47Z9 Other specified neoplasms of uncertain behavior of lymphoid, hematopoietic and related tissue: Secondary | ICD-10-CM

## 2013-05-09 MED ORDER — HYDROXYUREA 500 MG PO CAPS: ORAL_CAPSULE | ORAL | Status: DC

## 2013-05-10 ENCOUNTER — Other Ambulatory Visit: Payer: Self-pay | Admitting: *Deleted

## 2013-05-10 DIAGNOSIS — D47Z9 Other specified neoplasms of uncertain behavior of lymphoid, hematopoietic and related tissue: Secondary | ICD-10-CM

## 2013-05-10 MED ORDER — HYDROXYUREA 500 MG PO CAPS: 1000.0000 mg | ORAL_CAPSULE | Freq: Two times a day (BID) | ORAL | Status: DC

## 2013-05-10 MED ORDER — HYDROXYUREA 500 MG PO CAPS: ORAL_CAPSULE | ORAL | Status: DC

## 2013-05-10 NOTE — Telephone Encounter (Signed)
Patient called, she was instructed to increase Hydrea to 2caps bid and needs a new Rx.

## 2013-05-14 ENCOUNTER — Other Ambulatory Visit (HOSPITAL_BASED_OUTPATIENT_CLINIC_OR_DEPARTMENT_OTHER): Payer: BC Managed Care – PPO | Admitting: Lab

## 2013-05-14 DIAGNOSIS — D473 Essential (hemorrhagic) thrombocythemia: Secondary | ICD-10-CM

## 2013-05-14 LAB — CBC WITH DIFFERENTIAL/PLATELET
BASO%: 0.6 % (ref 0.0–2.0)
EOS%: 2.8 % (ref 0.0–7.0)
HCT: 39.8 % (ref 34.8–46.6)
MCH: 35 pg — ABNORMAL HIGH (ref 25.1–34.0)
MCHC: 33.9 g/dL (ref 31.5–36.0)
NEUT%: 55.4 % (ref 38.4–76.8)
RBC: 3.86 10*6/uL (ref 3.70–5.45)
lymph#: 1.6 10*3/uL (ref 0.9–3.3)

## 2013-05-16 ENCOUNTER — Telehealth: Payer: Self-pay | Admitting: Medical Oncology

## 2013-05-16 NOTE — Telephone Encounter (Signed)
Pt called for her CBC results. I called her back with results. She is to continue with same dose of hydrea and labs 06/11/13.

## 2013-06-10 ENCOUNTER — Telehealth: Payer: Self-pay | Admitting: *Deleted

## 2013-06-10 NOTE — Telephone Encounter (Signed)
Patient called reporting she has only been able to obtain thirty pills of hydrea.  Called [facility] at [address] who report receiving a call on 05-10-2013 for a change in directions of once a day and a quantity of thirty pills.  Correct sig. given at this time and instructions to place under covering provider number 2 Myra Rude who also is the on-call provider today.

## 2013-06-11 ENCOUNTER — Other Ambulatory Visit (HOSPITAL_BASED_OUTPATIENT_CLINIC_OR_DEPARTMENT_OTHER): Payer: BC Managed Care – PPO | Admitting: Lab

## 2013-06-11 DIAGNOSIS — D473 Essential (hemorrhagic) thrombocythemia: Secondary | ICD-10-CM

## 2013-06-11 LAB — CBC WITH DIFFERENTIAL/PLATELET
BASO%: 0.7 % (ref 0.0–2.0)
Basophils Absolute: 0 10*3/uL (ref 0.0–0.1)
EOS%: 2.9 % (ref 0.0–7.0)
HGB: 12.8 g/dL (ref 11.6–15.9)
MCH: 36.3 pg — ABNORMAL HIGH (ref 25.1–34.0)
MCHC: 34.1 g/dL (ref 31.5–36.0)
RDW: 14.7 % — ABNORMAL HIGH (ref 11.2–14.5)
WBC: 4.6 10*3/uL (ref 3.9–10.3)
lymph#: 1.5 10*3/uL (ref 0.9–3.3)

## 2013-06-20 ENCOUNTER — Other Ambulatory Visit: Payer: Self-pay | Admitting: *Deleted

## 2013-06-20 MED ORDER — AMLODIPINE BESYLATE-VALSARTAN 10-160 MG PO TABS: 1.0000 | ORAL_TABLET | Freq: Every day | ORAL | Status: DC

## 2013-06-20 NOTE — Telephone Encounter (Signed)
Rx was sent to pharmacy electronically. 

## 2013-06-25 ENCOUNTER — Telehealth: Payer: Self-pay | Admitting: Cardiovascular Disease

## 2013-06-25 ENCOUNTER — Telehealth: Payer: Self-pay | Admitting: *Deleted

## 2013-06-25 NOTE — Telephone Encounter (Signed)
Please call-when she picked up her Exforge it was totally different from what she had been getting.

## 2013-06-25 NOTE — Telephone Encounter (Signed)
Patient had concerns that her ex forge looked different than usual. Recommended that she confirms with the pharmacist that this is the correct medication. It may look different due to them maybe using a different manufacturer. If this is  Not the case call back to notify.

## 2013-07-16 ENCOUNTER — Other Ambulatory Visit (HOSPITAL_BASED_OUTPATIENT_CLINIC_OR_DEPARTMENT_OTHER): Payer: BC Managed Care – PPO | Admitting: Lab

## 2013-07-16 ENCOUNTER — Telehealth: Payer: Self-pay | Admitting: Internal Medicine

## 2013-07-16 ENCOUNTER — Ambulatory Visit (HOSPITAL_BASED_OUTPATIENT_CLINIC_OR_DEPARTMENT_OTHER): Payer: BC Managed Care – PPO | Admitting: Internal Medicine

## 2013-07-16 VITALS — BP 155/75 | HR 72 | Temp 97.3°F | Resp 20 | Ht 61.0 in | Wt 231.0 lb

## 2013-07-16 DIAGNOSIS — D473 Essential (hemorrhagic) thrombocythemia: Secondary | ICD-10-CM

## 2013-07-16 LAB — CBC WITH DIFFERENTIAL/PLATELET
BASO%: 1.2 % (ref 0.0–2.0)
EOS%: 3.1 % (ref 0.0–7.0)
HCT: 38.3 % (ref 34.8–46.6)
LYMPH%: 30.5 % (ref 14.0–49.7)
MCHC: 34.7 g/dL (ref 31.5–36.0)
MONO#: 0.3 10*3/uL (ref 0.1–0.9)
Platelets: 372 10*3/uL (ref 145–400)
RBC: 3.53 10*6/uL — ABNORMAL LOW (ref 3.70–5.45)
RDW: 13.2 % (ref 11.2–14.5)
WBC: 4.6 10*3/uL (ref 3.9–10.3)
lymph#: 1.4 10*3/uL (ref 0.9–3.3)

## 2013-07-16 NOTE — Telephone Encounter (Signed)
gv and printed appt sched and avs forpt for Oct thru Jan 2015

## 2013-07-16 NOTE — Progress Notes (Signed)
[facility] OFFICE PROGRESS NOTE  Sara Aliment, MD [address] [name] [address]  DIAGNOSIS: Essential thrombocytosis  CC: Essential Thrombocytosis  CURRENT THERAPY: Hydroxyurea 1000 mg daily as of 03/19/2013.  Aspirin 81 mg daily.  INTERVAL HISTORY: Sara [name] 57 y.o. female with history of JAK positive Essential thrombocythemia presents for four month follow-up.  She was last seen by Dr. Arline Asp on 03/19/2013.  She had a colonoscopy in 01/2012.  She reports that she will schedule her mammogram in the next several days.  She complains of bloating and constipation over the past several days.  She has not tried any over the counter medications or stool softners.  Otherwise, she reports that she is doing very well.  She denies any bleeding from her gums or easy bruising.    MEDICAL HISTORY: Past Medical History  Diagnosis Date  . Thrombocytosis   . Hypertension   . Diabetes mellitus   . Gout   . Renal artery stenosis 12/13/2010    PTCA and Stent - right  . Syncopal episodes   . PAD (peripheral artery disease)   . Hyperlipemia   . Morbid obesity   . LVH (left ventricular hypertrophy)     12/21/09 Echo -mild asymmetric LVH,EF =>55%    INTERIM HISTORY: has Essential thrombocytosis and Hypertension on her problem list.    ALLERGIES:  is allergic to sulfa antibiotics.  MEDICATIONS: has a current medication list which includes the following prescription(s): allopurinol, amlodipine-valsartan, aspirin, atorvastatin, cholecalciferol, clopidogrel, hydroxyurea, nebivolol, and sitagliptin-metformin.  SURGICAL HISTORY:  Past Surgical History  Procedure Laterality Date  . Breast reduction surgery  1995    S/P Bilateral breast reduction  . Cesarean section  1980, 1986    Times  Two.  . Breast cyst excision      S/P Benign Right Breast Cyst Removal.  . Partial hysterectomy  1994  . Extremity cyst excision  1993    left wrist    REVIEW OF  SYSTEMS:   Constitutional: Denies fevers, chills or abnormal weight loss Eyes: Denies blurriness of vision Ears, nose, mouth, throat, and face: Denies mucositis or sore throat Respiratory: Denies cough, dyspnea or wheezes Cardiovascular: Denies palpitation, chest discomfort or lower extremity swelling Gastrointestinal:  Denies nausea, heartburn ; she has constipation.  Skin: Denies abnormal skin rashes Lymphatics: Denies new lymphadenopathy or easy bruising Neurological:Denies numbness, tingling or new weaknesses Behavioral/Psych: Mood is stable, no new changes  All other systems were reviewed with the patient and are negative.  PHYSICAL EXAMINATION: ECOG PERFORMANCE STATUS: 0 - Asymptomatic  Blood pressure 155/75, pulse 72, temperature 97.3 F (36.3 C), temperature source Oral, resp. rate 20, height 5\' 1"  (1.549 m), weight 231 lb (104.781 kg).  GENERAL:alert, no distress and comfortable; obese pleasant middle-age woman SKIN: skin color, texture, turgor are normal, no rashes or significant lesions EYES: normal, Conjunctiva are pink and non-injected, sclera clear OROPHARYNX:no exudate, no erythema and lips, buccal mucosa, and tongue normal  NECK: supple, thyroid normal size, non-tender, without nodularity LYMPH:  no palpable lymphadenopathy in the cervical, axillary or inguinal LUNGS: clear to auscultation and percussion with normal breathing effort HEART: regular rate & rhythm and no murmurs and no lower extremity edema ABDOMEN:abdomen soft,  Mildly tender in the epigastric and normal bowel sounds Musculoskeletal:no cyanosis of digits and no clubbing  NEURO: alert & oriented x 3 with fluent speech, no focal motor/sensory deficits   LABORATORY DATA: Results for orders placed in visit on 07/16/13 (from the  past 48 hour(s))  CBC WITH DIFFERENTIAL     Status: Abnormal   Collection Time    07/16/13 10:35 AM      Result Value Range   WBC 4.6  3.9 - 10.3 10e3/uL   NEUT# 2.6  1.5 - 6.5  10e3/uL   HGB 13.3  11.6 - 15.9 g/dL   HCT 45.4  09.8 - 11.9 %   Platelets 372  145 - 400 10e3/uL   MCV 108.7 (*) 79.5 - 101.0 fL   MCH 37.7 (*) 25.1 - 34.0 pg   MCHC 34.7  31.5 - 36.0 g/dL   RBC 1.47 (*) 8.29 - 5.62 10e6/uL   RDW 13.2  11.2 - 14.5 %   lymph# 1.4  0.9 - 3.3 10e3/uL   MONO# 0.3  0.1 - 0.9 10e3/uL   Eosinophils Absolute 0.1  0.0 - 0.5 10e3/uL   Basophils Absolute 0.1  0.0 - 0.1 10e3/uL   NEUT% 57.6  38.4 - 76.8 %   LYMPH% 30.5  14.0 - 49.7 %   MONO% 7.6  0.0 - 14.0 %   EOS% 3.1  0.0 - 7.0 %   BASO% 1.2  0.0 - 2.0 %    CMP     Component Value Date/Time   NA 139 03/19/2013 1535   NA 140 04/19/2012 1059   K 3.7 03/19/2013 1535   K 3.5 04/19/2012 1059   CL 102 03/19/2013 1535   CL 100 04/19/2012 1059   CO2 27 03/19/2013 1535   CO2 30 04/19/2012 1059   GLUCOSE 117* 03/19/2013 1535   GLUCOSE 95 04/19/2012 1059   BUN 22.7 03/19/2013 1535   BUN 18 04/19/2012 1059   CREATININE 1.2* 03/19/2013 1535   CREATININE 1.11* 04/19/2012 1059   CALCIUM 9.3 03/19/2013 1535   CALCIUM 9.4 04/19/2012 1059   PROT 7.4 03/19/2013 1535   PROT 6.8 11/15/2011 1113   ALBUMIN 3.4* 03/19/2013 1535   ALBUMIN 4.2 11/15/2011 1113   AST 24 03/19/2013 1535   AST 17 11/15/2011 1113   ALT 23 03/19/2013 1535   ALT 18 11/15/2011 1113   ALKPHOS 77 03/19/2013 1535   ALKPHOS 66 11/15/2011 1113   BILITOT 0.42 03/19/2013 1535   BILITOT 0.5 11/15/2011 1113   GFRNONAA 42* 12/14/2010 0510   GFRAA  Value: 51        The eGFR has been calculated using the MDRD equation. This calculation has not been validated in all clinical situations. eGFR's persistently <60 mL/min signify possible Chronic Kidney Disease.* 12/14/2010 0510    RADIOGRAPHIC STUDIES: No results found.  ASSESSMENT: Essential thrombocythemia. Diagnosed in 2010. JAK2 mutation was present. The patient underwent a bone marrow aspirate and biopsy on 01/20/2009 which yielded a specimen that was suboptimal for evaluation. However, megakaryocytes were abundant with normal   morphology. There was no clustering or excess blasts seen. Platelet count on 01/20/2009 was 881. The patient is being treated with aspirin and hydroxyurea.  PLAN:  --She should continue hydroxyurea dose to 1000 mg daily. We will continue to check CBC every month. The patient is due for mammograms. She will increase her fiber intake and start an aggressive stool regiment.   We will have her return in about 4 months at which time we will check CBC and chemistries.  --All questions were answered. The patient knows to call the clinic with any problems, questions or concerns. We can certainly see the patient much sooner if necessary.  --I spent 15 minutes counseling the patient face to  face. The total time spent in the appointment was 30 minutes.    Janaysha Depaulo, MD 07/16/2013 11:19 AM

## 2013-07-16 NOTE — Patient Instructions (Signed)
Constipation, Adult Constipation is when a person has fewer than 3 bowel movements a week; has difficulty having a bowel movement; or has stools that are dry, hard, or larger than normal. As people grow older, constipation is more common. If you try to fix constipation with medicines that make you have a bowel movement (laxatives), the problem may get worse. Long-term laxative use may cause the muscles of the colon to become weak. A low-fiber diet, not taking in enough fluids, and taking certain medicines may make constipation worse. CAUSES   Certain medicines, such as antidepressants, pain medicine, iron supplements, antacids, and water pills.   Certain diseases, such as diabetes, irritable bowel syndrome (IBS), thyroid disease, or depression.   Not drinking enough water.   Not eating enough fiber-rich foods.   Stress or travel.  Lack of physical activity or exercise.  Not going to the restroom when there is the urge to have a bowel movement.  Ignoring the urge to have a bowel movement.  Using laxatives too much. SYMPTOMS   Having fewer than 3 bowel movements a week.   Straining to have a bowel movement.   Having hard, dry, or larger than normal stools.   Feeling full or bloated.   Pain in the lower abdomen.  Not feeling relief after having a bowel movement. DIAGNOSIS  Your caregiver will take a medical history and perform a physical exam. Further testing may be done for severe constipation. Some tests may include:   A barium enema X-ray to examine your rectum, colon, and sometimes, your small intestine.  A sigmoidoscopy to examine your lower colon.  A colonoscopy to examine your entire colon. TREATMENT  Treatment will depend on the severity of your constipation and what is causing it. Some dietary treatments include drinking more fluids and eating more fiber-rich foods. Lifestyle treatments may include regular exercise. If these diet and lifestyle recommendations  do not help, your caregiver may recommend taking over-the-counter laxative medicines to help you have bowel movements. Prescription medicines may be prescribed if over-the-counter medicines do not work.  HOME CARE INSTRUCTIONS   Increase dietary fiber in your diet, such as fruits, vegetables, whole grains, and beans. Limit high-fat and processed sugars in your diet, such as [place] fries, hamburgers, cookies, candies, and soda.   A fiber supplement may be added to your diet if you cannot get enough fiber from foods.   Drink enough fluids to keep your urine clear or pale yellow.   Exercise regularly or as directed by your caregiver.   Go to the restroom when you have the urge to go. Do not hold it.  Only take medicines as directed by your caregiver. Do not take other medicines for constipation without talking to your caregiver first. SEEK IMMEDIATE MEDICAL CARE IF:   You have bright red blood in your stool.   Your constipation lasts for more than 4 days or gets worse.   You have abdominal or rectal pain.   You have thin, pencil-like stools.  You have unexplained weight loss. MAKE SURE YOU:   Understand these instructions.  Will watch your condition.  Will get help right away if you are not doing well or get worse. Document Released: 07/08/2004 Document Revised: 01/02/2012 Document Reviewed: 09/13/2011 [facility] Patient Information 2014 [place], [place].  Medicines Colace, Sennakot, and Miralax

## 2013-07-17 ENCOUNTER — Encounter: Payer: Self-pay | Admitting: Internal Medicine

## 2013-07-20 ENCOUNTER — Inpatient Hospital Stay (HOSPITAL_COMMUNITY)
Admission: EM | Admit: 2013-07-20 | Discharge: 2013-07-22 | DRG: 566 | Disposition: A | Discharge status: Home / Self Care | Payer: BC Managed Care – PPO | Attending: Internal Medicine | Admitting: Internal Medicine

## 2013-07-20 ENCOUNTER — Encounter (HOSPITAL_COMMUNITY): Payer: Self-pay | Admitting: Emergency Medicine

## 2013-07-20 ENCOUNTER — Emergency Department (HOSPITAL_COMMUNITY): Payer: BC Managed Care – PPO

## 2013-07-20 DIAGNOSIS — N179 Acute kidney failure, unspecified: Secondary | ICD-10-CM | POA: Diagnosis present

## 2013-07-20 DIAGNOSIS — Z7902 Long term (current) use of antithrombotics/antiplatelets: Secondary | ICD-10-CM

## 2013-07-20 DIAGNOSIS — Z882 Allergy status to sulfonamides status: Secondary | ICD-10-CM

## 2013-07-20 DIAGNOSIS — I951 Orthostatic hypotension: Secondary | ICD-10-CM

## 2013-07-20 DIAGNOSIS — E860 Dehydration: Principal | ICD-10-CM | POA: Diagnosis present

## 2013-07-20 DIAGNOSIS — M109 Gout, unspecified: Secondary | ICD-10-CM | POA: Diagnosis present

## 2013-07-20 DIAGNOSIS — Z803 Family history of malignant neoplasm of breast: Secondary | ICD-10-CM

## 2013-07-20 DIAGNOSIS — I5030 Unspecified diastolic (congestive) heart failure: Secondary | ICD-10-CM

## 2013-07-20 DIAGNOSIS — E785 Hyperlipidemia, unspecified: Secondary | ICD-10-CM | POA: Diagnosis present

## 2013-07-20 DIAGNOSIS — Z8249 Family history of ischemic heart disease and other diseases of the circulatory system: Secondary | ICD-10-CM

## 2013-07-20 DIAGNOSIS — Z833 Family history of diabetes mellitus: Secondary | ICD-10-CM

## 2013-07-20 DIAGNOSIS — Z79899 Other long term (current) drug therapy: Secondary | ICD-10-CM

## 2013-07-20 DIAGNOSIS — E119 Type 2 diabetes mellitus without complications: Secondary | ICD-10-CM

## 2013-07-20 DIAGNOSIS — Z7982 Long term (current) use of aspirin: Secondary | ICD-10-CM

## 2013-07-20 DIAGNOSIS — IMO0002 Reserved for concepts with insufficient information to code with codable children: Secondary | ICD-10-CM

## 2013-07-20 DIAGNOSIS — I509 Heart failure, unspecified: Secondary | ICD-10-CM | POA: Diagnosis present

## 2013-07-20 DIAGNOSIS — I5032 Chronic diastolic (congestive) heart failure: Secondary | ICD-10-CM | POA: Diagnosis present

## 2013-07-20 DIAGNOSIS — I739 Peripheral vascular disease, unspecified: Secondary | ICD-10-CM | POA: Diagnosis present

## 2013-07-20 DIAGNOSIS — D473 Essential (hemorrhagic) thrombocythemia: Secondary | ICD-10-CM | POA: Diagnosis present

## 2013-07-20 DIAGNOSIS — IMO0001 Reserved for inherently not codable concepts without codable children: Secondary | ICD-10-CM | POA: Diagnosis present

## 2013-07-20 DIAGNOSIS — R55 Syncope and collapse: Secondary | ICD-10-CM | POA: Diagnosis present

## 2013-07-20 DIAGNOSIS — I10 Essential (primary) hypertension: Secondary | ICD-10-CM | POA: Diagnosis present

## 2013-07-20 DIAGNOSIS — E861 Hypovolemia: Secondary | ICD-10-CM | POA: Diagnosis present

## 2013-07-20 HISTORY — DX: Type 2 diabetes mellitus without complications: E11.9

## 2013-07-20 LAB — URINALYSIS, ROUTINE W REFLEX MICROSCOPIC
Bilirubin Urine: NEGATIVE
Glucose, UA: NEGATIVE mg/dL
Hgb urine dipstick: NEGATIVE
Ketones, ur: NEGATIVE mg/dL
Leukocytes, UA: NEGATIVE
Nitrite: NEGATIVE
Specific Gravity, Urine: 1.013 (ref 1.005–1.030)
pH: 6 (ref 5.0–8.0)

## 2013-07-20 LAB — CBC WITH DIFFERENTIAL/PLATELET
Basophils Absolute: 0 10*3/uL (ref 0.0–0.1)
Eosinophils Relative: 2 % (ref 0–5)
HCT: 36.1 % (ref 36.0–46.0)
Lymphocytes Relative: 21 % (ref 12–46)
Lymphs Abs: 0.9 10*3/uL (ref 0.7–4.0)
MCV: 104.9 fL — ABNORMAL HIGH (ref 78.0–100.0)
Monocytes Absolute: 0.3 10*3/uL (ref 0.1–1.0)
Neutro Abs: 3 10*3/uL (ref 1.7–7.7)
Platelets: 348 10*3/uL (ref 150–400)
RBC: 3.44 MIL/uL — ABNORMAL LOW (ref 3.87–5.11)
RDW: 12.6 % (ref 11.5–15.5)
WBC: 4.4 10*3/uL (ref 4.0–10.5)

## 2013-07-20 LAB — POCT I-STAT TROPONIN I: Troponin i, poc: 0 ng/mL (ref 0.00–0.08)

## 2013-07-20 LAB — GLUCOSE, CAPILLARY
Glucose-Capillary: 96 mg/dL (ref 70–99)
Glucose-Capillary: 104 mg/dL — ABNORMAL HIGH (ref 70–99)

## 2013-07-20 LAB — COMPREHENSIVE METABOLIC PANEL
ALT: 19 U/L (ref 0–35)
AST: 23 U/L (ref 0–37)
Albumin: 3.4 g/dL — ABNORMAL LOW (ref 3.5–5.2)
Alkaline Phosphatase: 64 U/L (ref 39–117)
Chloride: 103 mEq/L (ref 96–112)
GFR calc Af Amer: 60 mL/min — ABNORMAL LOW (ref >90)
Potassium: 3.6 mEq/L (ref 3.5–5.1)
Sodium: 140 mEq/L (ref 135–145)
Total Bilirubin: 0.3 mg/dL (ref 0.3–1.2)
Total Protein: 6.9 g/dL (ref 6.0–8.3)

## 2013-07-20 LAB — TROPONIN I: Troponin I: <0.3 ng/mL (ref <0.30)

## 2013-07-20 MED ORDER — ASPIRIN EC 81 MG PO TBEC
81.0000 mg | DELAYED_RELEASE_TABLET | Freq: Every day | ORAL | Status: DC
  Administered 2013-07-20 – 2013-07-22 (×3): 81 mg via ORAL
  Filled 2013-07-20 (×3): qty 1

## 2013-07-20 MED ORDER — ALLOPURINOL 100 MG PO TABS: 100.0000 mg | ORAL_TABLET | Freq: Every day | ORAL | Status: DC

## 2013-07-20 MED ORDER — ENOXAPARIN SODIUM 40 MG/0.4ML ~~LOC~~ SOLN
40.0000 mg | SUBCUTANEOUS | Status: DC
  Filled 2013-07-20 (×3): qty 0.4

## 2013-07-20 MED ORDER — ACETAMINOPHEN 650 MG RE SUPP: 650.0000 mg | Freq: Four times a day (QID) | RECTAL | Status: DC | PRN

## 2013-07-20 MED ORDER — HYDRALAZINE HCL 10 MG PO TABS: 10.0000 mg | ORAL_TABLET | Freq: Three times a day (TID) | ORAL | Status: DC

## 2013-07-20 MED ORDER — SODIUM CHLORIDE 0.9 % IV BOLUS (SEPSIS)
500.0000 mL | Freq: Once | INTRAVENOUS | Status: AC
  Administered 2013-07-20: 500 mL via INTRAVENOUS

## 2013-07-20 MED ORDER — ACETAMINOPHEN 325 MG PO TABS: 650.0000 mg | ORAL_TABLET | Freq: Four times a day (QID) | ORAL | Status: DC | PRN

## 2013-07-20 MED ORDER — LORATADINE 10 MG PO TABS
10.0000 mg | ORAL_TABLET | Freq: Every evening | ORAL | Status: DC | PRN
  Filled 2013-07-20: qty 1

## 2013-07-20 MED ORDER — HYDROXYUREA 500 MG PO CAPS
1000.0000 mg | ORAL_CAPSULE | Freq: Two times a day (BID) | ORAL | Status: DC
  Administered 2013-07-20 – 2013-07-21 (×2): 500 mg via ORAL
  Filled 2013-07-20 (×3): qty 2

## 2013-07-20 MED ORDER — NEBIVOLOL HCL 10 MG PO TABS
10.0000 mg | ORAL_TABLET | Freq: Every day | ORAL | Status: DC
  Administered 2013-07-21 – 2013-07-22 (×2): 10 mg via ORAL
  Filled 2013-07-20 (×2): qty 1

## 2013-07-20 MED ORDER — SODIUM CHLORIDE 0.9 % IV SOLN
INTRAVENOUS | Status: DC
  Administered 2013-07-21: 07:00:00 via INTRAVENOUS

## 2013-07-20 MED ORDER — VITAMIN D3 25 MCG (1000 UNIT) PO TABS
2000.0000 [IU] | ORAL_TABLET | Freq: Two times a day (BID) | ORAL | Status: DC
  Administered 2013-07-20 – 2013-07-22 (×4): 2000 [IU] via ORAL
  Filled 2013-07-20 (×5): qty 2

## 2013-07-20 MED ORDER — NEBIVOLOL HCL 10 MG PO TABS: 20.0000 mg | ORAL_TABLET | Freq: Every day | ORAL | Status: DC

## 2013-07-20 MED ORDER — HYDRALAZINE HCL 10 MG PO TABS
10.0000 mg | ORAL_TABLET | Freq: Three times a day (TID) | ORAL | Status: DC | PRN
  Filled 2013-07-20: qty 1

## 2013-07-20 MED ORDER — CLOPIDOGREL BISULFATE 75 MG PO TABS
75.0000 mg | ORAL_TABLET | Freq: Every day | ORAL | Status: DC
  Administered 2013-07-21 – 2013-07-22 (×2): 75 mg via ORAL
  Filled 2013-07-20 (×2): qty 1

## 2013-07-20 MED ORDER — STUDY - INVESTIGATIONAL MEDICATION
100.0000 mg | Freq: Every day | Status: DC
  Administered 2013-07-22: 100 mg via ORAL

## 2013-07-20 MED ORDER — ATORVASTATIN CALCIUM 10 MG PO TABS
10.0000 mg | ORAL_TABLET | Freq: Every day | ORAL | Status: DC
  Administered 2013-07-20 – 2013-07-22 (×3): 10 mg via ORAL
  Filled 2013-07-20 (×3): qty 1

## 2013-07-20 MED ORDER — INSULIN ASPART 100 UNIT/ML ~~LOC~~ SOLN
0.0000 [IU] | SUBCUTANEOUS | Status: DC
Start: 2013-07-20 — End: 2013-07-21

## 2013-07-20 MED ORDER — NEBIVOLOL HCL 10 MG PO TABS: 10.0000 mg | ORAL_TABLET | Freq: Every day | ORAL | Status: DC

## 2013-07-20 MED ORDER — LEVOCETIRIZINE DIHYDROCHLORIDE 5 MG PO TABS: 5.0000 mg | ORAL_TABLET | Freq: Every evening | ORAL | Status: DC | PRN

## 2013-07-20 NOTE — ED Notes (Signed)
Lab notified to run CMP on blood collected.

## 2013-07-20 NOTE — ED Notes (Signed)
Pt to ED via EMS with for witnessed syncopal episode by husband that lasted for about 2 minutes. Pt does not remember or feel anything prior to syncopal episode. Pt is taking allopurinol for gout studies that she took prior to passing out. Pt had her carbetalol switched to bystolic that cause her to feel dizzy. Pt also taking hydroxyurea for high platelets. BP sitting-136/90, BP standing-140/86, HR-80 regular, CBG-231 (diabetic).

## 2013-07-20 NOTE — H&P (Signed)
[facility] History and Physical  Sara [name] UJW:[id number] DOB: 1956-10-05 DOA: 07/20/2013  Referring physician:  PCP: [name], MD  Specialists:   Chief Complaint: Syncope  HPI: Sara [name] is a 57 y.o. female 57 y.o.BF PMHx  Essential thrombocytosis, HTN, right renal artery stent placed 2013, DM type II, ARF presenting with syncope. The history is provided by the patient.  Loss of Consciousness Associated symptoms: no chest pain, no headaches, no nausea, no shortness of breath, no vomiting and no weakness, positive one week of diarrhea. States has had multiple episodes of syncope(2 occurred at work and witnessed by her coworkers). States she's feeling kind of dizzy getting out of of  bed earlier today and then passed out. Her husband caught her. She's been feeling a little dizzy.  No chest pain. No palpitations. No headache. No confusion. The other syncopal episodes were a couple months ago.  Procedure  Echocardiogram 12/21/2009 -Mild asymmetric LVH -LVEF=55% -Transmitral spectral Doppler flow pattern suggestive compared LV relaxation     Review of Systems: The patient denies anorexia, fever, weight loss,, vision loss, decreased hearing, hoarseness, chest pain,dyspnea on exertion, peripheral edema, balance deficits, hemoptysis, abdominal pain, melena, hematochezia, severe indigestion/heartburn, hematuria, incontinence, genital sores, muscle weakness, suspicious skin lesions, transient blindness, difficulty walking, depression, unusual weight change, abnormal bleeding, enlarged lymph nodes, angioedema, and breast masses.    Past Medical History  Diagnosis Date  . Thrombocytosis   . Hypertension   . Diabetes mellitus   . Gout   . Renal artery stenosis 12/13/2010    PTCA and Stent - right  . Syncopal episodes   . PAD (peripheral artery disease)   . Hyperlipemia   . Morbid obesity   . LVH (left ventricular hypertrophy)     12/21/09 Echo -mild asymmetric  LVH,EF =>55%   Past Surgical History  Procedure Laterality Date  . Breast reduction surgery  1995    S/P Bilateral breast reduction  . Cesarean section  1980, 1986    Times  Two.  . Breast cyst excision      S/P Benign Right Breast Cyst Removal.  . Partial hysterectomy  1994  . Extremity cyst excision  1993    left wrist   Social History:  reports that she has never smoked. She has never used smokeless tobacco. She reports that she does not drink alcohol or use illicit drugs. Visit home with husband  Allergies  Allergen Reactions  . Sulfa Antibiotics Itching    Family History  Problem Relation Age of Onset  . Breast cancer Maternal Aunt     2 maternal aunts had breast cancer.  Marland Kitchen Heart failure Mother   . Diabetes Father   . Diabetes Sister   . Hypertension Sister   . Breast cancer Maternal Grandmother   . Hypertension Sister   . Hypertension Sister     Prior to Admission medications   Medication Sig Start Date End Date Taking? Authorizing Provider  allopurinol (ZYLOPRIM) 100 MG tablet Take 100 mg by mouth daily. Research study with Dr. Allyne Gee- pt may be receiving placebo   Yes Historical Provider, MD  amLODipine-valsartan (EXFORGE) 10-160 MG per tablet Take 1 tablet by mouth daily. 06/20/13  Yes Mihai Croitoru, MD  aspirin 81 MG tablet Take 81 mg by mouth daily.   Yes Historical Provider, MD  atorvastatin (LIPITOR) 10 MG tablet Take 10 mg by mouth daily.   Yes Historical Provider, MD  cholecalciferol (VITAMIN D) 1000 UNITS tablet Take 2,000 Units by  mouth 2 (two) times daily.    Yes Historical Provider, MD  clopidogrel (PLAVIX) 75 MG tablet Take 75 mg by mouth daily.   Yes Historical Provider, MD  hydroxyurea (HYDREA) 500 MG capsule Take 2 capsules (1,000 mg total) by mouth 2 (two) times daily. May take with food to minimize GI side effects. 05/10/13  Yes Samul Dada, MD  levocetirizine (XYZAL) 5 MG tablet Take 5 mg by mouth at bedtime as needed for allergies.    Yes  Historical Provider, MD  nebivolol (BYSTOLIC) 10 MG tablet Take 10 mg by mouth daily.   Yes Historical Provider, MD  sitaGLIPtan-metformin (JANUMET) 50-500 MG per tablet Take 1 tablet by mouth 2 (two) times daily.    Yes Historical Provider, MD   Physical Exam: Filed Vitals:   07/20/13 2020 07/20/13 2024 07/20/13 2026 07/20/13 2031  BP: 149/65 153/67 152/78 134/74  Pulse: 65 68 72 74  Temp: 97.8 F (36.6 C)     TempSrc:      Resp: 20     Height:      Weight:      SpO2: 98%        General: A./O. x4,NAD   Eyes: Pupils equal reactive to light and accommodation  Neck: Negative JVD  Cardiovascular: Regular in rate, negative murmurs rubs gallops, DP/PT pulse 2+ bilateral  Respiratory:  Clear to auscultation bilateral  Abdomen: Soft nontender nondistended plus bowel sounds  Musculoskeletal: Negative pedal edema bilateral  Neurologic: Within normal limits  Labs on Admission:  Basic Metabolic Panel:  Recent Labs Lab 07/20/13 1400 07/20/13 1413 07/20/13 1609 07/20/13 2017  NA 140 143 144  --   K 3.6 3.7 3.6  --   CL 103 105 104  --   CO2 27  --   --   --   GLUCOSE 130* 144* 133*  --   BUN 17 22 23   --   CREATININE 1.14* 3.40* 3.50*  --   CALCIUM 9.0  --   --   --   MG  --   --   --  2.0   Liver Function Tests:  Recent Labs Lab 07/20/13 1400  AST 23  ALT 19  ALKPHOS 64  BILITOT 0.3  PROT 6.9  ALBUMIN 3.4*   No results found for this basename: LIPASE, AMYLASE,  in the last 168 hours No results found for this basename: AMMONIA,  in the last 168 hours CBC:  Recent Labs Lab 07/16/13 1035 07/20/13 1400 07/20/13 1413 07/20/13 1609  WBC 4.6 4.4  --   --   NEUTROABS 2.6 3.0  --   --   HGB 13.3 13.1 12.6 12.6  HCT 38.3 36.1 37.0 37.0  MCV 108.7* 104.9*  --   --   PLT 372 348  --   --    Cardiac Enzymes:  Recent Labs Lab 07/20/13 2017  TROPONINI <0.30    BNP (last 3 results) No results found for this basename: PROBNP,  in the last 8760  hours CBG:  Recent Labs Lab 07/20/13 1807 07/20/13 2040  GLUCAP 96 104*    Radiological Exams on Admission: Dg Chest 2 View  07/20/2013   CLINICAL DATA:  Syncope. History of hypertension and syncopal episodes.  EXAM: CHEST  2 VIEW  COMPARISON:  12/07/2010.  FINDINGS: The heart size and mediastinal contours are within normal limits. Both lungs are clear. The visualized skeletal structures are unremarkable.  IMPRESSION: No active cardiopulmonary disease.   Electronically Signed  By: Amie Portland   On: 07/20/2013 15:46    EKG: NSR   Assessment/Plan Active Problems:   Essential thrombocytosis   Hypertension   Type II or unspecified type diabetes mellitus without mention of complication, not stated as uncontrolled   Acute renal failure   History of renal stent   Orthostatic hypotension   Diastolic CHF   1. Diastolic CHF; we'll gently hydrate patient overnight -EKG was unremarkable, will obtain echocardiogram in the a.m. -Obtain proBNP -Obtain troponin x3  2. HTN; add hydralazine 10 mg every 8 hours PRN if patient's SBP> 160 or DBP> 100 exceeds -Continue Bystolic 10 mg daily  3. ARF; gently hydrate patient with normal saline -DC'd home medication Exforge which contains an ARB  4. DM type II; will home medication start patient on moderate SSI for control -Obtain hemoglobin A1c, lipid panel  5. HLD; continue home regimen  6. Orthostatic hypotension  held BP medications except for Bystolic  7. History right renal stent placement;   8. Essential thrombocytosis; stable continue home meds; if ARF does not prove will need to reconsider D/Cing. vs decreasing medication         Code Status: Full Family Communication:  Disposition Plan:   Time spent: 90 minutes Drema [place] [facility] Pager [phone]  If 7PM-7AM, please contact night-coverage [email or web address] Password [facility] 07/20/2013, 11:11 PM

## 2013-07-20 NOTE — ED Provider Notes (Signed)
CSN: [id number]     Arrival date & time 07/20/13  1245 History   First MD Initiated Contact with Patient 07/20/13 1318     Chief Complaint  Patient presents with  . Loss of Consciousness   (Consider location/radiation/quality/duration/timing/severity/associated sxs/prior Treatment) Patient is a 57 y.o. female presenting with syncope. The history is provided by the patient.  Loss of Consciousness Associated symptoms: no chest pain, no headaches, no nausea, no shortness of breath, no vomiting and no weakness    patient with an episode of syncope. States she's feeling kind of bed earlier today and then passed out. Her husband caught her. She's been feeling a little dizzy. She had 2 other episodes of syncope. No chest pain. No palpitations. No headache. No confusion. The other syncopal episodes were a couple months ago. Past Medical History  Diagnosis Date  . Thrombocytosis   . Hypertension   . Diabetes mellitus   . Gout   . Renal artery stenosis 12/13/2010    PTCA and Stent - right  . Syncopal episodes   . PAD (peripheral artery disease)   . Hyperlipemia   . Morbid obesity   . LVH (left ventricular hypertrophy)     12/21/09 Echo -mild asymmetric LVH,EF =>55%   Past Surgical History  Procedure Laterality Date  . Breast reduction surgery  1995    S/P Bilateral breast reduction  . Cesarean section  1980, 1986    Times  Two.  . Breast cyst excision      S/P Benign Right Breast Cyst Removal.  . Partial hysterectomy  1994  . Extremity cyst excision  1993    left wrist   Family History  Problem Relation Age of Onset  . Breast cancer Maternal Aunt     2 maternal aunts had breast cancer.  Marland Kitchen Heart failure Mother   . Diabetes Father   . Diabetes Sister   . Hypertension Sister   . Breast cancer Maternal Grandmother   . Hypertension Sister   . Hypertension Sister    History  Substance Use Topics  . Smoking status: Never Smoker   . Smokeless tobacco: Never Used  . Alcohol Use: No    OB History   Grav Para Term Preterm Abortions TAB SAB Ect Mult Living                 Review of Systems  Constitutional: Negative for activity change and appetite change.  HENT: Negative for neck stiffness.   Eyes: Negative for pain.  Respiratory: Negative for chest tightness and shortness of breath.   Cardiovascular: Positive for syncope. Negative for chest pain and leg swelling.  Gastrointestinal: Negative for nausea, vomiting, abdominal pain and diarrhea.  Genitourinary: Negative for flank pain.  Musculoskeletal: Negative for back pain.  Skin: Negative for rash.  Neurological: Positive for syncope and light-headedness. Negative for weakness, numbness and headaches.  Psychiatric/Behavioral: Negative for behavioral problems.    Allergies  Sulfa antibiotics  Home Medications   Current Outpatient Rx  Name  Route  Sig  Dispense  Refill  . senna-docusate (SENOKOT S) 8.6-50 MG per tablet   Oral   Take 1 tablet by mouth at bedtime as needed for constipation (you can take 2 tabs daily if needed).   60 tablet   3   . Wheat Dextrin (BENEFIBER) TABS   Oral   Take 1 tablet by mouth daily.   42 each   3    BP 138/57  Pulse 78  Temp(Src) 98.4 F (  36.9 C) (Oral)  Resp 20  Ht 5\' 3"  (1.6 m)  Wt 229 lb 12.8 oz (104.237 kg)  BMI 40.72 kg/m2  SpO2 100% Physical Exam  Nursing note and vitals reviewed. Constitutional: She is oriented to person, place, and time. She appears well-developed and well-nourished.  HENT:  Head: Normocephalic and atraumatic.  Eyes: EOM are normal. Pupils are equal, round, and reactive to light.  Neck: Normal range of motion. Neck supple.  Cardiovascular: Normal rate, regular rhythm and normal heart sounds.   No murmur heard. Pulmonary/Chest: Effort normal and breath sounds normal. No respiratory distress. She has no wheezes. She has no rales.  Abdominal: Soft. Bowel sounds are normal. She exhibits no distension. There is no tenderness. There is no  rebound and no guarding.  Musculoskeletal: Normal range of motion.  Neurological: She is alert and oriented to person, place, and time. No cranial nerve deficit.  Skin: Skin is warm and dry.  Psychiatric: She has a normal mood and affect. Her speech is normal.    ED Course  Procedures (including critical care time) Labs Review Labs Reviewed  CBC WITH DIFFERENTIAL - Abnormal; Notable for the following:    RBC 3.44 (*)    MCV 104.9 (*)    MCH 38.1 (*)    MCHC 36.3 (*)    All other components within normal limits  COMPREHENSIVE METABOLIC PANEL - Abnormal; Notable for the following:    Glucose, Bld 130 (*)    Creatinine, Ser 1.14 (*)    Albumin 3.4 (*)    GFR calc non Af Amer 52 (*)    GFR calc Af Amer 60 (*)    All other components within normal limits  HEMOGLOBIN A1C - Abnormal; Notable for the following:    Hemoglobin A1C 5.9 (*)    Mean Plasma Glucose 123 (*)    All other components within normal limits  GLUCOSE, CAPILLARY - Abnormal; Notable for the following:    Glucose-Capillary 104 (*)    All other components within normal limits  COMPREHENSIVE METABOLIC PANEL - Abnormal; Notable for the following:    Potassium 3.3 (*)    Glucose, Bld 108 (*)    Albumin 3.1 (*)    GFR calc non Af Amer 56 (*)    GFR calc Af Amer 65 (*)    All other components within normal limits  GLUCOSE, CAPILLARY - Abnormal; Notable for the following:    Glucose-Capillary 127 (*)    All other components within normal limits  GLUCOSE, CAPILLARY - Abnormal; Notable for the following:    Glucose-Capillary 102 (*)    All other components within normal limits  POCT I-STAT, CHEM 8 - Abnormal; Notable for the following:    Creatinine, Ser 3.40 (*)    Glucose, Bld 144 (*)    All other components within normal limits  POCT I-STAT, CHEM 8 - Abnormal; Notable for the following:    Creatinine, Ser 3.50 (*)    Glucose, Bld 133 (*)    All other components within normal limits  URINALYSIS, ROUTINE W REFLEX  MICROSCOPIC  GLUCOSE, CAPILLARY  MAGNESIUM  TROPONIN I  TROPONIN I  TROPONIN I  LIPID PANEL  MAGNESIUM  GLUCOSE, CAPILLARY  GLUCOSE, CAPILLARY  TSH  GLUCOSE, CAPILLARY  COMPREHENSIVE METABOLIC PANEL  MAGNESIUM  POCT I-STAT TROPONIN I   Imaging Review Dg Chest 2 View  07/20/2013   CLINICAL DATA:  Syncope. History of hypertension and syncopal episodes.  EXAM: CHEST  2 VIEW  COMPARISON:  12/07/2010.  FINDINGS: The heart size and mediastinal contours are within normal limits. Both lungs are clear. The visualized skeletal structures are unremarkable.  IMPRESSION: No active cardiopulmonary disease.   Electronically Signed   By: Amie Portland   On: 07/20/2013 15:46   Date: 07/21/2013  Rate:71  Rhythm: normal sinus rhythm  QRS Axis: normal  Intervals: normal  ST/T Wave abnormalities: normal  Conduction Disutrbances: none  Narrative Interpretation: unremarkable     MDM   1. Syncope   2. Acute renal failure   3. Diastolic CHF   4. Essential thrombocytosis   5. History of renal stent   6. Orthostatic hypotension   7. Type II or unspecified type diabetes mellitus without mention of complication, not stated as uncontrolled    Patient with syncope.   i-STAT showed elevated creatinine, however CMP did not show the elevation. With the syncope she did require admission. Will be admitted to trauma as per   Juliet Rude. Rubin Payor, MD 07/21/13 2006

## 2013-07-21 DIAGNOSIS — I509 Heart failure, unspecified: Secondary | ICD-10-CM

## 2013-07-21 DIAGNOSIS — Z9889 Other specified postprocedural states: Secondary | ICD-10-CM

## 2013-07-21 DIAGNOSIS — R55 Syncope and collapse: Secondary | ICD-10-CM

## 2013-07-21 DIAGNOSIS — N179 Acute kidney failure, unspecified: Secondary | ICD-10-CM

## 2013-07-21 DIAGNOSIS — D473 Essential (hemorrhagic) thrombocythemia: Secondary | ICD-10-CM

## 2013-07-21 DIAGNOSIS — I5030 Unspecified diastolic (congestive) heart failure: Secondary | ICD-10-CM

## 2013-07-21 LAB — GLUCOSE, CAPILLARY
Glucose-Capillary: 127 mg/dL — ABNORMAL HIGH (ref 70–99)
Glucose-Capillary: 88 mg/dL (ref 70–99)

## 2013-07-21 LAB — HEMOGLOBIN A1C
Hgb A1c MFr Bld: 5.9 % — ABNORMAL HIGH (ref <5.7)
Mean Plasma Glucose: 123 mg/dL — ABNORMAL HIGH (ref <117)

## 2013-07-21 LAB — COMPREHENSIVE METABOLIC PANEL
ALT: 16 U/L (ref 0–35)
AST: 13 U/L (ref 0–37)
Albumin: 3.1 g/dL — ABNORMAL LOW (ref 3.5–5.2)
Alkaline Phosphatase: 60 U/L (ref 39–117)
Potassium: 3.3 mEq/L — ABNORMAL LOW (ref 3.5–5.1)
Sodium: 143 mEq/L (ref 135–145)
Total Protein: 6.3 g/dL (ref 6.0–8.3)

## 2013-07-21 LAB — MAGNESIUM: Magnesium: 1.8 mg/dL (ref 1.5–2.5)

## 2013-07-21 LAB — LIPID PANEL
LDL Cholesterol: 46 mg/dL (ref 0–99)
Total CHOL/HDL Ratio: 2.3 RATIO
VLDL: 10 mg/dL (ref 0–40)

## 2013-07-21 MED ORDER — BENEFIBER PO TABS: 1.0000 | ORAL_TABLET | Freq: Every day | ORAL | Status: DC

## 2013-07-21 MED ORDER — HYDROXYUREA 500 MG PO CAPS
500.0000 mg | ORAL_CAPSULE | Freq: Two times a day (BID) | ORAL | Status: DC
  Administered 2013-07-21 – 2013-07-22 (×2): 500 mg via ORAL
  Filled 2013-07-21 (×3): qty 1

## 2013-07-21 MED ORDER — SENNOSIDES-DOCUSATE SODIUM 8.6-50 MG PO TABS: 1.0000 | ORAL_TABLET | Freq: Every evening | ORAL | Status: DC | PRN

## 2013-07-21 MED ORDER — POTASSIUM CHLORIDE CRYS ER 20 MEQ PO TBCR
40.0000 meq | EXTENDED_RELEASE_TABLET | Freq: Once | ORAL | Status: AC
  Administered 2013-07-21: 40 meq via ORAL
  Filled 2013-07-21: qty 2

## 2013-07-21 MED ORDER — INSULIN ASPART 100 UNIT/ML ~~LOC~~ SOLN: 0.0000 [IU] | Freq: Three times a day (TID) | SUBCUTANEOUS | Status: DC

## 2013-07-21 NOTE — Progress Notes (Signed)
Patient ID: Sara [name]  female  YQM:[id number]    DOB: 1956/07/20    DOA: 07/20/2013  PCP: [name], MD  Assessment/Plan: Principal Problem:   Syncope: Likely due to dehydration, diarrhea x 1 week, her creatinine was 3.5 admission, antihypertensives -3 sets of cardiac enzymes are negative, on IV hydration, creatinine improved to 1.0 - 2d ECHO was ordered, still pending   Diarrhea: Patient actually reports significant constipation prior to this issue, was recommended high-fiber diet with stool softeners and laxatives by her hematologist. She still taking prune juice with laxatives which subsequently caused the diarrhea for a week. - currently improved    Active Problems:   Essential thrombocytosis: cont ASA and hydrea    Hypertension: - Continue only bystolic - hold norvasc/valsartan    Type II Diabetes uncontrolled: - Only on sliding scale insulin - Can restart oral hypoglycemics at home    Acute renal failure: Resolved, continue gentle hydration  DVT Prophylaxis:  Code Status:  Disposition:Hopefully today if echo results are available    Subjective: No complaints, no chest pain, shortness of breath, fever, chills or abdominal pain.  Objective: Weight change:   Intake/Output Summary (Last 24 hours) at 07/21/13 1031 Last data filed at 07/21/13 0900  Gross per 24 hour  Intake    360 ml  Output      0 ml  Net    360 ml   Blood pressure 133/56, pulse 67, temperature 98.4 F (36.9 C), temperature source Oral, resp. rate 18, height 5\' 3"  (1.6 m), weight 104.237 kg (229 lb 12.8 oz), SpO2 96.00%.  Physical Exam: General: Alert and awake, oriented x3, not in any acute distress. CVS: S1-S2 clear, no murmur rubs or gallops Chest: clear to auscultation bilaterally, no wheezing, rales or rhonchi Abdomen: soft nontender, nondistended, normal bowel sounds, small ventral hernia  Extremities: no cyanosis, clubbing or edema noted bilaterally Neuro: Cranial nerves  II-XII intact, no focal neurological deficits  Lab Results: Basic Metabolic Panel:  Recent Labs Lab 07/20/13 1400  07/20/13 1609  07/21/13 0220 07/21/13 0655  NA 140  < > 144  --  143  --   K 3.6  < > 3.6  --  3.3*  --   CL 103  < > 104  --  107  --   CO2 27  --   --   --  27  --   GLUCOSE 130*  < > 133*  --  108*  --   BUN 17  < > 23  --  15  --   CREATININE 1.14*  < > 3.50*  --  1.07  --   CALCIUM 9.0  --   --   --  8.5  --   MG  --   --   --   < >  --  1.8  < > = values in this interval not displayed. Liver Function Tests:  Recent Labs Lab 07/20/13 1400 07/21/13 0220  AST 23 13  ALT 19 16  ALKPHOS 64 60  BILITOT 0.3 0.3  PROT 6.9 6.3  ALBUMIN 3.4* 3.1*   No results found for this basename: LIPASE, AMYLASE,  in the last 168 hours No results found for this basename: AMMONIA,  in the last 168 hours CBC:  Recent Labs Lab 07/16/13 1035  07/20/13 1400 07/20/13 1413 07/20/13 1609  WBC 4.6  --  4.4  --   --   NEUTROABS 2.6  --  3.0  --   --  HGB 13.3  < > 13.1 12.6 12.6  HCT 38.3  < > 36.1 37.0 37.0  MCV 108.7*  --  104.9*  --   --   PLT 372  --  348  --   --   < > = values in this interval not displayed. Cardiac Enzymes:  Recent Labs Lab 07/20/13 2017 07/21/13 0220 07/21/13 0655  TROPONINI <0.30 <0.30 <0.30   BNP: No components found with this basename: POCBNP,  CBG:  Recent Labs Lab 07/20/13 1807 07/20/13 2040 07/21/13 0047 07/21/13 0421 07/21/13 0736  GLUCAP 96 104* 127* 88 96     Micro Results: No results found for this or any previous visit (from the past 240 hour(s)).  Studies/Results: Dg Chest 2 View  07/20/2013   CLINICAL DATA:  Syncope. History of hypertension and syncopal episodes.  EXAM: CHEST  2 VIEW  COMPARISON:  12/07/2010.  FINDINGS: The heart size and mediastinal contours are within normal limits. Both lungs are clear. The visualized skeletal structures are unremarkable.  IMPRESSION: No active cardiopulmonary disease.    Electronically Signed   By: Amie Portland   On: 07/20/2013 15:46    Medications: Scheduled Meds: . aspirin EC  81 mg Oral Daily  . atorvastatin  10 mg Oral q1800  . cholecalciferol  2,000 Units Oral BID  . clopidogrel  75 mg Oral Q breakfast  . enoxaparin (LOVENOX) injection  40 mg Subcutaneous Q24H  . hydroxyurea  500 mg Oral BID  . insulin aspart  0-15 Units Subcutaneous Q4H  . nebivolol  10 mg Oral Daily  . STUDY MEDICATION 100 mg  100 mg Oral Daily      LOS: 1 day   Perry Molla M.D. [facility] 07/21/2013, 10:31 AM Pager: [phone]  If 7PM-7AM, please contact night-coverage [email or web address] Password [email or web address]

## 2013-07-22 DIAGNOSIS — I517 Cardiomegaly: Secondary | ICD-10-CM

## 2013-07-22 LAB — POCT I-STAT, CHEM 8
BUN: 22 mg/dL (ref 6–23)
BUN: 23 mg/dL (ref 6–23)
Calcium, Ion: 1.18 mmol/L (ref 1.12–1.23)
Chloride: 105 mEq/L (ref 96–112)
Creatinine, Ser: 3.5 mg/dL — ABNORMAL HIGH (ref 0.50–1.10)
Glucose, Bld: 133 mg/dL — ABNORMAL HIGH (ref 70–99)
HCT: 37 % (ref 36.0–46.0)
HCT: 37 % (ref 36.0–46.0)
Hemoglobin: 12.6 g/dL (ref 12.0–15.0)
Hemoglobin: 12.6 g/dL (ref 12.0–15.0)
Potassium: 3.7 mEq/L (ref 3.5–5.1)
TCO2: 28 mmol/L (ref 0–100)

## 2013-07-22 LAB — COMPREHENSIVE METABOLIC PANEL
ALT: 17 U/L (ref 0–35)
AST: 15 U/L (ref 0–37)
Albumin: 3.2 g/dL — ABNORMAL LOW (ref 3.5–5.2)
Alkaline Phosphatase: 60 U/L (ref 39–117)
CO2: 26 mEq/L (ref 19–32)
Calcium: 8.8 mg/dL (ref 8.4–10.5)
Chloride: 106 mEq/L (ref 96–112)
Creatinine, Ser: 1.04 mg/dL (ref 0.50–1.10)
GFR calc Af Amer: 67 mL/min — ABNORMAL LOW (ref >90)
GFR calc non Af Amer: 58 mL/min — ABNORMAL LOW (ref >90)
Glucose, Bld: 83 mg/dL (ref 70–99)
Sodium: 143 mEq/L (ref 135–145)

## 2013-07-22 LAB — GLUCOSE, CAPILLARY
Glucose-Capillary: 122 mg/dL — ABNORMAL HIGH (ref 70–99)
Glucose-Capillary: 85 mg/dL (ref 70–99)

## 2013-07-22 NOTE — Progress Notes (Signed)
  Echocardiogram 2D Echocardiogram has been performed.  Sara [name] FRANCES 07/22/2013, 9:34 AM

## 2013-07-22 NOTE — Discharge Summary (Signed)
Physician Discharge Summary  Patient ID: Sara [name] MRN: [id number] DOB/AGE: 01-01-1956 57 y.o.  Admit date: 07/20/2013 Discharge date: 07/22/2013  Primary Care Physician:  [name], MD  Discharge Diagnoses:   . Syncope: Likely due to dehydration, diarrhea x 1 week, creatinine 3.5 at admission . Type II  diabetes mellitus  . Acute renal failure . Essential thrombocytosis . Hypertension . Orthostatic hypotension . chronic Diastolic CHF-compensated   . Syncope  Consults: None   Recommendations for Outpatient Follow-up:  1) patient was advised to hold Exforge until followup with PCP.  2) creatinine was 3.5 at admission, resolved with IV fluid hydration and holding Exforge. Creatinine on 9/29 is 1.04   Allergies:   Allergies  Allergen Reactions  . Sulfa Antibiotics Itching     Discharge Medications:   Medication List    STOP taking these medications       amLODipine-valsartan 10-160 MG per tablet  Commonly known as:  EXFORGE      TAKE these medications       aspirin 81 MG tablet  Take 81 mg by mouth daily.     atorvastatin 10 MG tablet  Commonly known as:  LIPITOR  Take 10 mg by mouth daily.     Benefiber Tabs  Take 1 tablet by mouth daily.     cholecalciferol 1000 UNITS tablet  Commonly known as:  VITAMIN D  Take 2,000 Units by mouth 2 (two) times daily.     clopidogrel 75 MG tablet  Commonly known as:  PLAVIX  Take 75 mg by mouth daily.     hydroxyurea 500 MG capsule  Commonly known as:  HYDREA  Take 500 mg by mouth 2 (two) times daily. May take with food to minimize GI side effects.     levocetirizine 5 MG tablet  Commonly known as:  XYZAL  Take 5 mg by mouth at bedtime as needed for allergies.     nebivolol 10 MG tablet  Commonly known as:  BYSTOLIC  Take 10 mg by mouth daily.     senna-docusate 8.6-50 MG per tablet  Commonly known as:  SENOKOT S  Take 1 tablet by mouth at bedtime as needed for constipation (you can take 2 tabs  daily if needed).     sitaGLIPtin-metformin 50-500 MG per tablet  Commonly known as:  JANUMET  Take 1 tablet by mouth 2 (two) times daily.     STUDY MEDICATION  Take 100 mg by mouth daily. Research study with Dr. Allyne Gee - pt may be receiving placebo or allopurinol         Brief H and P: For complete details please refer to admission H and P, but in brief the patient is a 57 year old female with past medical history of essential thrombocytosis, hypertension, right renal artery stent, placed in 2013, diabetes type 2 presented with syncopal episode. year old female with past medical history of essential thrombocytosis, hypertension, right renal artery stent, placed in 2013, diabetes type 2 presented with syncopal episode. Patient reported that she was having one week of diarrhea, multiple episodes of syncope (2 at work). On the day of admission, she felt kind of dizzy getting out of the bed and passed out. Patient presented to the ER for further workup.   Hospital Course:  Syncope: Likely due to dehydration, diarrhea x 1 week, her creatinine was 3.5 admission and antihypertensives. Patient was admitted to telemetry floor. 3 sets of cardiac enzymes were negative. She was placed on IV hydration. Exforge was held, patient was continued on bystolic.  2-D echo showed EF 55-60%, no wall motion abnormalities. Patient was recommended to continue holding Exforge until  she follows up with her PCP.  Diarrhea: Patient actually reported significant constipation prior to this issue, was recommended high-fiber diet with stool softeners and laxatives by her hematologist. She started taking prune juice with laxatives which subsequently caused the diarrhea for a week. This has now improved. I recommended the patient to take Benefiber with Senokot S. at bedtime.   Essential thrombocytosis: cont ASA and hydrea, followup with Dr. Rosie Fate outpatient.  Hypertension:  - Continue only bystolic, hold norvasc/valsartan   Type II Diabetes:  Can restart oral hypoglycemics at home   Acute renal failure: Likely secondary to dehydration and diarrhea, creatinine was 3.5 at the time of admission,  improved to 1.0 at discharge.  Patient is ambulating in the room without any difficulty, currently back to her baseline.      Day of Discharge BP 145/64  Pulse 55  Temp(Src) 98.2 F (36.8 C) (Oral)  Resp 18  Ht 5\' 3"  (1.6 m)  Wt 104.237 kg (229 lb 12.8 oz)  BMI 40.72 kg/m2  SpO2 97%  Physical Exam: General: Alert and awake oriented x3 not in any acute distress. CVS: S1-S2 clear no murmur rubs or gallops Chest: clear to auscultation bilaterally, no wheezing rales or rhonchi Abdomen: soft nontender, nondistended, normal bowel sounds Extremities: no cyanosis, clubbing or edema noted bilaterally Neuro: Cranial nerves II-XII intact, no focal neurological deficits   The results of significant diagnostics from this hospitalization (including imaging, microbiology, ancillary and laboratory) are listed below for reference.    LAB RESULTS: Basic Metabolic Panel:  Recent Labs Lab 07/21/13 0220  07/22/13 0443  NA 143  --  143  K 3.3*  --  3.5  CL 107  --  106  CO2 27  --  26  GLUCOSE 108*  --  83  BUN 15  --  14  CREATININE 1.07  --  1.04  CALCIUM 8.5  --  8.8  MG  --   < > 1.9  < > = values in this interval not displayed. Liver Function Tests:  Recent Labs Lab 07/21/13 0220 07/22/13 0443  AST 13 15  ALT 16 17  ALKPHOS 60 60  BILITOT 0.3 0.3  PROT 6.3 6.2  ALBUMIN 3.1* 3.2*   No results found for this basename: LIPASE, AMYLASE,  in the last 168 hours No results found for this basename: AMMONIA,  in the last 168 hours CBC:  Recent Labs Lab 07/16/13 1035  07/20/13 1400 07/20/13 1413 07/20/13 1609  WBC 4.6  --  4.4  --   --   NEUTROABS 2.6  --  3.0  --   --   HGB 13.3  < > 13.1 12.6 12.6  HCT 38.3  < > 36.1 37.0 37.0  MCV 108.7*  --  104.9*  --   --   PLT 372  --  348  --   --   < > = values in this interval not displayed. Cardiac Enzymes:  Recent Labs Lab 07/21/13 0220 07/21/13 0655  TROPONINI <0.30 <0.30   BNP: No components found with this  basename: POCBNP,  CBG:  Recent Labs Lab 07/22/13 0812 07/22/13 1133  GLUCAP 122* 85    Significant Diagnostic Studies:  Dg Chest 2 View  07/20/2013   CLINICAL DATA:  Syncope. History of hypertension and syncopal episodes.  EXAM: CHEST  2 VIEW  COMPARISON:  12/07/2010.  FINDINGS: The heart size and mediastinal contours are within normal limits. Both lungs are clear. The visualized skeletal structures are  unremarkable.  IMPRESSION: No active cardiopulmonary disease.   Electronically Signed   By: Amie Portland   On: 07/20/2013 15:46    2D ECHO: Study Conclusions  - Left ventricle: The cavity size was normal. Wall thickness was increased in a pattern of mild LVH. Systolic function was normal. The estimated ejection fraction was in the range of 55% to 60%. Wall motion was normal; there were no regional wall motion abnormalities. - Left atrium: The atrium was mildly dilated. - Atrial septum: No defect or patent foramen ovale was identified.    Disposition and Follow-up:     Discharge Orders   Future Appointments Provider Department Dept Phone   08/13/2013 8:00 AM [facility] [phone]   09/10/2013 8:15 AM [facility] Only [facility] [phone]   10/08/2013 8:00 AM [facility] Only [facility] [phone]   11/05/2013 8:00 AM Krista [facility] [phone]   11/05/2013 8:30 AM [facility] Covering Provider [facility] [phone]   Future Orders Complete By Expires   Diet Carb Modified  As directed    Discharge instructions  As directed    Comments:     Please HOLD EXFORGE (BP medication) until follow-up with Dr Allyne Gee.   Increase activity slowly  As directed        DISPOSITION:Home   DIET: Carb modify diet   ACTIVITY: As tolerated   DISCHARGE FOLLOW-UP Follow-up Information    Follow up with [name], MD. Schedule an appointment as soon as possible for a visit in 10 days. (for hospital follow-up)    Specialty:  Internal Medicine   Contact information:   [address] [place] [address] [phone]       Time spent on Discharge: 35 minutes   Signed:   Calyn Sivils M.D. [facility] 07/22/2013, 12:02 PM Pager: [phone]

## 2013-07-22 NOTE — Progress Notes (Signed)
UR Completed Dalyce Renne Fronczak-Bigelow, RN,BSN [phone]  

## 2013-08-13 ENCOUNTER — Other Ambulatory Visit (HOSPITAL_BASED_OUTPATIENT_CLINIC_OR_DEPARTMENT_OTHER): Payer: BC Managed Care – PPO | Admitting: Lab

## 2013-08-13 DIAGNOSIS — D473 Essential (hemorrhagic) thrombocythemia: Secondary | ICD-10-CM

## 2013-08-13 LAB — CBC WITH DIFFERENTIAL/PLATELET
Basophils Absolute: 0 10*3/uL (ref 0.0–0.1)
EOS%: 4 % (ref 0.0–7.0)
HCT: 36.5 % (ref 34.8–46.6)
HGB: 12.6 g/dL (ref 11.6–15.9)
MCH: 37.6 pg — ABNORMAL HIGH (ref 25.1–34.0)
MCV: 109 fL — ABNORMAL HIGH (ref 79.5–101.0)
MONO#: 0.4 10*3/uL (ref 0.1–0.9)
MONO%: 9.8 % (ref 0.0–14.0)
NEUT%: 50.2 % (ref 38.4–76.8)
Platelets: 319 10*3/uL (ref 145–400)
RBC: 3.35 10*6/uL — ABNORMAL LOW (ref 3.70–5.45)
WBC: 4.2 10*3/uL (ref 3.9–10.3)

## 2013-08-24 ENCOUNTER — Encounter (HOSPITAL_COMMUNITY): Payer: Self-pay | Admitting: Emergency Medicine

## 2013-08-24 ENCOUNTER — Emergency Department (HOSPITAL_COMMUNITY)
Admission: EM | Admit: 2013-08-24 | Discharge: 2013-08-25 | Disposition: A | Discharge status: Home / Self Care | Payer: BC Managed Care – PPO | Attending: Emergency Medicine | Admitting: Emergency Medicine

## 2013-08-24 DIAGNOSIS — Z862 Personal history of diseases of the blood and blood-forming organs and certain disorders involving the immune mechanism: Secondary | ICD-10-CM | POA: Insufficient documentation

## 2013-08-24 DIAGNOSIS — Z7902 Long term (current) use of antithrombotics/antiplatelets: Secondary | ICD-10-CM | POA: Insufficient documentation

## 2013-08-24 DIAGNOSIS — E785 Hyperlipidemia, unspecified: Secondary | ICD-10-CM | POA: Insufficient documentation

## 2013-08-24 DIAGNOSIS — Z7982 Long term (current) use of aspirin: Secondary | ICD-10-CM | POA: Insufficient documentation

## 2013-08-24 DIAGNOSIS — R569 Unspecified convulsions: Secondary | ICD-10-CM | POA: Insufficient documentation

## 2013-08-24 DIAGNOSIS — E119 Type 2 diabetes mellitus without complications: Secondary | ICD-10-CM | POA: Insufficient documentation

## 2013-08-24 DIAGNOSIS — Z79899 Other long term (current) drug therapy: Secondary | ICD-10-CM | POA: Insufficient documentation

## 2013-08-24 DIAGNOSIS — I10 Essential (primary) hypertension: Secondary | ICD-10-CM | POA: Insufficient documentation

## 2013-08-24 NOTE — ED Notes (Signed)
Pt arrives via EMS. States she has no hx of seizures. Husband awoke to pt shaking. CBG 135. Post ictal and confused when EMS arrived. Pt bit side of tongue. AAO when in truck. 20 LAC. 12 lead completed - ST.

## 2013-08-25 ENCOUNTER — Emergency Department (HOSPITAL_COMMUNITY): Payer: BC Managed Care – PPO

## 2013-08-25 LAB — COMPREHENSIVE METABOLIC PANEL
ALT: 16 U/L (ref 0–35)
Alkaline Phosphatase: 64 U/L (ref 39–117)
Calcium: 9.2 mg/dL (ref 8.4–10.5)
Creatinine, Ser: 1.12 mg/dL — ABNORMAL HIGH (ref 0.50–1.10)
GFR calc Af Amer: 61 mL/min — ABNORMAL LOW (ref >90)
Glucose, Bld: 108 mg/dL — ABNORMAL HIGH (ref 70–99)
Potassium: 3.3 mEq/L — ABNORMAL LOW (ref 3.5–5.1)
Sodium: 140 mEq/L (ref 135–145)
Total Protein: 6.6 g/dL (ref 6.0–8.3)

## 2013-08-25 LAB — CBC WITH DIFFERENTIAL/PLATELET
Basophils Relative: 1 % (ref 0–1)
Eosinophils Absolute: 0.1 10*3/uL (ref 0.0–0.7)
Eosinophils Relative: 2 % (ref 0–5)
Lymphs Abs: 1.9 10*3/uL (ref 0.7–4.0)
MCH: 38.9 pg — ABNORMAL HIGH (ref 26.0–34.0)
MCV: 109.7 fL — ABNORMAL HIGH (ref 78.0–100.0)
Platelets: 356 10*3/uL (ref 150–400)
RDW: 12.9 % (ref 11.5–15.5)

## 2013-08-25 LAB — URINALYSIS, ROUTINE W REFLEX MICROSCOPIC
Bilirubin Urine: NEGATIVE
Leukocytes, UA: NEGATIVE
Nitrite: NEGATIVE
Specific Gravity, Urine: 1.011 (ref 1.005–1.030)
pH: 5 (ref 5.0–8.0)

## 2013-08-25 LAB — RAPID URINE DRUG SCREEN, HOSP PERFORMED
Benzodiazepines: NOT DETECTED
Cocaine: NOT DETECTED
Opiates: NOT DETECTED
Tetrahydrocannabinol: NOT DETECTED

## 2013-08-25 LAB — URINE MICROSCOPIC-ADD ON

## 2013-08-25 NOTE — ED Provider Notes (Addendum)
CSN: [id number]     Arrival date & time 08/24/13  2329 History   First MD Initiated Contact with Patient 08/25/13 0004     Chief Complaint  Patient presents with  . Seizures   (Consider location/radiation/quality/duration/timing/severity/associated sxs/prior Treatment) Patient is a 57 y.o. female presenting with seizures. The history is provided by the patient and the spouse.  Seizures Her husband noted that she was shaking uncontrollably while they were in bed. A physical last about 10 minutes. She bit her tongue but did not have any incontinence. When she stopped shaking, and she did not regained normal mentation for about another 15 minutes. She has had a syncopal episodes but never shaking episodes like this. Of note, she is having some medications adjusted recently had Bistolic reduced from 10 mg a day to 5 mg a day. She denies headache. Denies arthralgias or myalgias. She denies fever.  Past Medical History  Diagnosis Date  . Thrombocytosis   . Hypertension   . Diabetes mellitus   . Gout   . Renal artery stenosis 12/13/2010    PTCA and Stent - right  . Syncopal episodes   . PAD (peripheral artery disease)   . Hyperlipemia   . Morbid obesity   . LVH (left ventricular hypertrophy)     12/21/09 Echo -mild asymmetric LVH,EF =>55%   Past Surgical History  Procedure Laterality Date  . Breast reduction surgery  1995    S/P Bilateral breast reduction  . Cesarean section  1980, 1986    Times  Two.  . Breast cyst excision      S/P Benign Right Breast Cyst Removal.  . Partial hysterectomy  1994  . Extremity cyst excision  1993    left wrist   Family History  Problem Relation Age of Onset  . Breast cancer Maternal Aunt     2 maternal aunts had breast cancer.  Marland Kitchen Heart failure Mother   . Diabetes Father   . Diabetes Sister   . Hypertension Sister   . Breast cancer Maternal Grandmother   . Hypertension Sister   . Hypertension Sister    History  Substance Use Topics  .  Smoking status: Never Smoker   . Smokeless tobacco: Never Used  . Alcohol Use: No   OB History   Grav Para Term Preterm Abortions TAB SAB Ect Mult Living                 Review of Systems  Neurological: Positive for seizures.  All other systems reviewed and are negative.    Allergies  Sulfa antibiotics  Home Medications   Current Outpatient Rx  Name  Route  Sig  Dispense  Refill  . aspirin 81 MG tablet   Oral   Take 81 mg by mouth daily.         Marland Kitchen atorvastatin (LIPITOR) 10 MG tablet   Oral   Take 10 mg by mouth daily.         . cholecalciferol (VITAMIN D) 1000 UNITS tablet   Oral   Take 2,000 Units by mouth 2 (two) times daily.          . clopidogrel (PLAVIX) 75 MG tablet   Oral   Take 75 mg by mouth daily.         . hydroxyurea (HYDREA) 500 MG capsule   Oral   Take 500 mg by mouth 2 (two) times daily. May take with food to minimize GI side effects.         Marland Kitchen  levocetirizine (XYZAL) 5 MG tablet   Oral   Take 5 mg by mouth at bedtime as needed for allergies.          Marland Kitchen senna-docusate (SENOKOT S) 8.6-50 MG per tablet   Oral   Take 1 tablet by mouth at bedtime as needed for constipation (you can take 2 tabs daily if needed).   60 tablet   3   . sitaGLIPtan-metformin (JANUMET) 50-500 MG per tablet   Oral   Take 1 tablet by mouth 2 (two) times daily.          . STUDY MEDICATION   Oral   Take 100 mg by mouth daily. Research study with Dr. Allyne Gee - pt may be receiving placebo or allopurinol          BP 139/60  Pulse 97  Temp(Src) 99.1 F (37.3 C) (Oral)  Resp 18  SpO2 95% Physical Exam  Nursing note and vitals reviewed.  57 year old female, resting comfortably and in no acute distress. Vital signs are normal. Oxygen saturation is 95%, which is normal. Head is normocephalic and atraumatic. PERRLA, EOMI. Oropharynx is clear. Fundi show no hemorrhage, exudate, or papilledema. Neck is nontender and supple without adenopathy or JVD. Back is  nontender and there is no CVA tenderness. Lungs are clear without rales, wheezes, or rhonchi. Chest is nontender. Heart has regular rate and rhythm without murmur. Abdomen is soft, flat, nontender without masses or hepatosplenomegaly and peristalsis is normoactive. Extremities have no cyanosis or edema, full range of motion is present. Skin is warm and dry without rash. Neurologic: Mental status is normal, cranial nerves are intact, there are no motor or sensory deficits.  ED Course  Procedures (including critical care time) Labs Review Results for orders placed during the hospital encounter of 08/24/13  CBC WITH DIFFERENTIAL      Result Value Range   WBC 5.3  4.0 - 10.5 K/uL   RBC 3.29 (*) 3.87 - 5.11 MIL/uL   Hemoglobin 12.8  12.0 - 15.0 g/dL   HCT 16.1  09.6 - 04.5 %   MCV 109.7 (*) 78.0 - 100.0 fL   MCH 38.9 (*) 26.0 - 34.0 pg   MCHC 35.5  30.0 - 36.0 g/dL   RDW 40.9  81.1 - 91.4 %   Platelets 356  150 - 400 K/uL   Neutrophils Relative % 50  43 - 77 %   Neutro Abs 2.7  1.7 - 7.7 K/uL   Lymphocytes Relative 36  12 - 46 %   Lymphs Abs 1.9  0.7 - 4.0 K/uL   Monocytes Relative 11  3 - 12 %   Monocytes Absolute 0.6  0.1 - 1.0 K/uL   Eosinophils Relative 2  0 - 5 %   Eosinophils Absolute 0.1  0.0 - 0.7 K/uL   Basophils Relative 1  0 - 1 %   Basophils Absolute 0.0  0.0 - 0.1 K/uL  COMPREHENSIVE METABOLIC PANEL      Result Value Range   Sodium 140  135 - 145 mEq/L   Potassium 3.3 (*) 3.5 - 5.1 mEq/L   Chloride 102  96 - 112 mEq/L   CO2 25  19 - 32 mEq/L   Glucose, Bld 108 (*) 70 - 99 mg/dL   BUN 16  6 - 23 mg/dL   Creatinine, Ser 7.82 (*) 0.50 - 1.10 mg/dL   Calcium 9.2  8.4 - 95.6 mg/dL   Total Protein 6.6  6.0 - 8.3 g/dL  Albumin 3.3 (*) 3.5 - 5.2 g/dL   AST 20  0 - 37 U/L   ALT 16  0 - 35 U/L   Alkaline Phosphatase 64  39 - 117 U/L   Total Bilirubin 0.3  0.3 - 1.2 mg/dL   GFR calc non Af Amer 53 (*) >90 mL/min   GFR calc Af Amer 61 (*) >90 mL/min  URINALYSIS,  ROUTINE W REFLEX MICROSCOPIC      Result Value Range   Color, Urine STRAW (*) YELLOW   APPearance CLOUDY (*) CLEAR   Specific Gravity, Urine 1.011  1.005 - 1.030   pH 5.0  5.0 - 8.0   Glucose, UA NEGATIVE  NEGATIVE mg/dL   Hgb urine dipstick SMALL (*) NEGATIVE   Bilirubin Urine NEGATIVE  NEGATIVE   Ketones, ur NEGATIVE  NEGATIVE mg/dL   Protein, ur NEGATIVE  NEGATIVE mg/dL   Urobilinogen, UA 0.2  0.0 - 1.0 mg/dL   Nitrite NEGATIVE  NEGATIVE   Leukocytes, UA NEGATIVE  NEGATIVE  URINE RAPID DRUG SCREEN (HOSP PERFORMED)      Result Value Range   Opiates NONE DETECTED  NONE DETECTED   Cocaine NONE DETECTED  NONE DETECTED   Benzodiazepines NONE DETECTED  NONE DETECTED   Amphetamines NONE DETECTED  NONE DETECTED   Tetrahydrocannabinol NONE DETECTED  NONE DETECTED   Barbiturates NONE DETECTED  NONE DETECTED  URINE MICROSCOPIC-ADD ON      Result Value Range   Squamous Epithelial / LPF FEW (*) RARE   WBC, UA 0-2  <3 WBC/hpf   RBC / HPF 0-2  <3 RBC/hpf   Bacteria, UA FEW (*) RARE   Imaging Review Ct Head Wo Contrast  08/25/2013   CLINICAL DATA:  Seizures  EXAM: CT HEAD WITHOUT CONTRAST  TECHNIQUE: Contiguous axial images were obtained from the base of the skull through the vertex without intravenous contrast.  COMPARISON:  None  FINDINGS: Normal ventricular morphology.  No midline shift or mass effect.  Normal appearance of brain parenchyma.  No intracranial hemorrhage, mass lesion or evidence of acute infarction.  No extra-axial fluid collection.  Scattered falcine calcifications.  Bones and sinuses unremarkable.  IMPRESSION: No acute intracranial abnormalities.   Electronically Signed   By: Ulyses Southward M.D.   On: 08/25/2013 01:31   ECG shows normal sinus rhythm with a rate of 95, no ectopy. Normal axis. Normal P wave. Normal QRS. Normal intervals. Normal ST and T waves. Impression: normal ECG. When compared with ECG of 07/20/2012, no significant changes are seen to  MDM   1. Seizure     Probable seizure. Seizure workup is initiated. I do not anticipate starting her on anticonvulsants. She will need an outpatient EEG. Old records are reviewed and she was admitted for evaluation of syncope 2 months ago but that was related to dehydration.  Workup is unremarkable including normal platelet count. She is discharged with instructions to arrange for an outpatient EEG. Patient was advised of legal requirement not to drive until she goes 6 months without a seizure.  Dione Booze, MD 08/25/13 0301  Dione Booze, MD 08/25/13 [id number]

## 2013-09-09 ENCOUNTER — Ambulatory Visit (INDEPENDENT_AMBULATORY_CARE_PROVIDER_SITE_OTHER): Payer: BC Managed Care – PPO | Admitting: Radiology

## 2013-09-09 DIAGNOSIS — R569 Unspecified convulsions: Secondary | ICD-10-CM

## 2013-09-09 NOTE — Procedures (Signed)
    History:  Sara [name] is a 57 year old patient with an episode of shaking that occurred on 08/24/2013. The patient was told she may possibly have had a seizure. The patient being evaluated for this issue.  This is a routine EEG. No skull defects are noted. Medications include aspirin, Lipitor, vitamin D, Plavix, Flonase, hydroxyurea, Xyzal, Bystolic, Senokot, Diovan, and Janumet.   EEG classification: Normal awake  Description of the recording: The background rhythms of this recording consists of a fairly well modulated medium amplitude alpha rhythm of 9 Hz that is reactive to eye opening and closure. As the record progresses, the patient appears to remain in the waking state throughout the recording. Photic stimulation was performed, resulting in a bilateral and symmetric photic driving response. Hyperventilation was also performed, resulting in a minimal buildup of the background rhythm activities without significant slowing seen. At no time during the recording does there appear to be evidence of spike or spike wave discharges or evidence of focal slowing.  Throughout the recording, the electrode artifact is seen at the [facility] and F8 electrodes. EKG monitor shows no evidence of cardiac rhythm abnormalities with a heart rate of 60.  Impression: This is a normal EEG recording in the waking state. No evidence of ictal or interictal discharges are seen.

## 2013-09-10 ENCOUNTER — Encounter (INDEPENDENT_AMBULATORY_CARE_PROVIDER_SITE_OTHER): Payer: Self-pay

## 2013-09-10 ENCOUNTER — Other Ambulatory Visit (HOSPITAL_BASED_OUTPATIENT_CLINIC_OR_DEPARTMENT_OTHER): Payer: BC Managed Care – PPO

## 2013-09-10 DIAGNOSIS — D473 Essential (hemorrhagic) thrombocythemia: Secondary | ICD-10-CM

## 2013-09-10 LAB — CBC WITH DIFFERENTIAL/PLATELET
BASO%: 0.6 % (ref 0.0–2.0)
Basophils Absolute: 0 10*3/uL (ref 0.0–0.1)
HCT: 38.1 % (ref 34.8–46.6)
HGB: 13.2 g/dL (ref 11.6–15.9)
MCHC: 34.6 g/dL (ref 31.5–36.0)
MONO#: 0.3 10*3/uL (ref 0.1–0.9)
NEUT#: 3.3 10*3/uL (ref 1.5–6.5)
NEUT%: 62.9 % (ref 38.4–76.8)
RBC: 3.53 10*6/uL — ABNORMAL LOW (ref 3.70–5.45)
RDW: 11.8 % (ref 11.2–14.5)
WBC: 5.3 10*3/uL (ref 3.9–10.3)
lymph#: 1.5 10*3/uL (ref 0.9–3.3)

## 2013-09-11 ENCOUNTER — Ambulatory Visit: Payer: BC Managed Care – PPO | Admitting: Neurology

## 2013-09-25 ENCOUNTER — Telehealth: Payer: Self-pay | Admitting: Neurology

## 2013-09-25 NOTE — Telephone Encounter (Signed)
I called the patient and I talked with the husband. The EEG is normal.

## 2013-09-25 NOTE — Telephone Encounter (Signed)
Patient called requesting EEG results and is concerned that she has not heard back about it yet. Please advise.

## 2013-10-07 ENCOUNTER — Telehealth: Payer: Self-pay | Admitting: Neurology

## 2013-10-07 NOTE — Telephone Encounter (Signed)
I called the patient. I had called previously and talked with the husband and let him know that the EEG study was normal. It is not clear that the patient never got that result.

## 2013-10-07 NOTE — Telephone Encounter (Signed)
Please advise 

## 2013-10-08 ENCOUNTER — Encounter (INDEPENDENT_AMBULATORY_CARE_PROVIDER_SITE_OTHER): Payer: Self-pay

## 2013-10-08 ENCOUNTER — Other Ambulatory Visit (HOSPITAL_BASED_OUTPATIENT_CLINIC_OR_DEPARTMENT_OTHER): Payer: BC Managed Care – PPO

## 2013-10-08 DIAGNOSIS — D473 Essential (hemorrhagic) thrombocythemia: Secondary | ICD-10-CM

## 2013-10-08 LAB — CBC WITH DIFFERENTIAL/PLATELET
Basophils Absolute: 0 10*3/uL (ref 0.0–0.1)
Eosinophils Absolute: 0.2 10*3/uL (ref 0.0–0.5)
HGB: 12.7 g/dL (ref 11.6–15.9)
LYMPH%: 24.9 % (ref 14.0–49.7)
MCV: 111.1 fL — ABNORMAL HIGH (ref 79.5–101.0)
MONO#: 0.4 10*3/uL (ref 0.1–0.9)
MONO%: 7.7 % (ref 0.0–14.0)
NEUT#: 3.4 10*3/uL (ref 1.5–6.5)
NEUT%: 63.2 % (ref 38.4–76.8)
Platelets: 388 10*3/uL (ref 145–400)
RBC: 3.27 10*6/uL — ABNORMAL LOW (ref 3.70–5.45)
RDW: 12.3 % (ref 11.2–14.5)
WBC: 5.4 10*3/uL (ref 3.9–10.3)
lymph#: 1.4 10*3/uL (ref 0.9–3.3)

## 2013-10-14 ENCOUNTER — Ambulatory Visit: Payer: BC Managed Care – PPO | Admitting: Neurology

## 2013-10-25 ENCOUNTER — Emergency Department (HOSPITAL_COMMUNITY)
Admission: EM | Admit: 2013-10-25 | Discharge: 2013-10-25 | Disposition: A | Discharge status: Home / Self Care | Payer: BC Managed Care – PPO | Attending: Emergency Medicine | Admitting: Emergency Medicine

## 2013-10-25 ENCOUNTER — Encounter (HOSPITAL_COMMUNITY): Payer: Self-pay | Admitting: Emergency Medicine

## 2013-10-25 DIAGNOSIS — M79609 Pain in unspecified limb: Secondary | ICD-10-CM | POA: Insufficient documentation

## 2013-10-25 DIAGNOSIS — M109 Gout, unspecified: Secondary | ICD-10-CM | POA: Insufficient documentation

## 2013-10-25 DIAGNOSIS — Z862 Personal history of diseases of the blood and blood-forming organs and certain disorders involving the immune mechanism: Secondary | ICD-10-CM | POA: Insufficient documentation

## 2013-10-25 DIAGNOSIS — Z7902 Long term (current) use of antithrombotics/antiplatelets: Secondary | ICD-10-CM | POA: Insufficient documentation

## 2013-10-25 DIAGNOSIS — E785 Hyperlipidemia, unspecified: Secondary | ICD-10-CM | POA: Insufficient documentation

## 2013-10-25 DIAGNOSIS — E119 Type 2 diabetes mellitus without complications: Secondary | ICD-10-CM | POA: Insufficient documentation

## 2013-10-25 DIAGNOSIS — G40909 Epilepsy, unspecified, not intractable, without status epilepticus: Secondary | ICD-10-CM | POA: Insufficient documentation

## 2013-10-25 DIAGNOSIS — I10 Essential (primary) hypertension: Secondary | ICD-10-CM | POA: Insufficient documentation

## 2013-10-25 DIAGNOSIS — R569 Unspecified convulsions: Secondary | ICD-10-CM

## 2013-10-25 DIAGNOSIS — Z7982 Long term (current) use of aspirin: Secondary | ICD-10-CM | POA: Insufficient documentation

## 2013-10-25 DIAGNOSIS — Z79899 Other long term (current) drug therapy: Secondary | ICD-10-CM | POA: Insufficient documentation

## 2013-10-25 MED ORDER — OXYCODONE-ACETAMINOPHEN 5-325 MG PO TABS
1.0000 | ORAL_TABLET | Freq: Once | ORAL | Status: AC
  Administered 2013-10-25: 1 via ORAL
  Filled 2013-10-25: qty 1

## 2013-10-25 MED ORDER — LORAZEPAM 1 MG PO TABS
ORAL_TABLET | ORAL | Status: AC
  Filled 2013-10-25: qty 1

## 2013-10-25 MED ORDER — LORAZEPAM 1 MG PO TABS
1.0000 mg | ORAL_TABLET | Freq: Once | ORAL | Status: AC
  Administered 2013-10-25: 1 mg via ORAL

## 2013-10-25 MED ORDER — LEVETIRACETAM 500 MG PO TABS
500.0000 mg | ORAL_TABLET | Freq: Once | ORAL | Status: AC
  Administered 2013-10-25: 500 mg via ORAL
  Filled 2013-10-25: qty 1

## 2013-10-25 MED ORDER — LEVETIRACETAM 500 MG PO TABS: 500.0000 mg | ORAL_TABLET | Freq: Two times a day (BID) | ORAL | Status: DC

## 2013-10-25 MED ORDER — HYDROMORPHONE HCL PF 1 MG/ML IJ SOLN: 1.0000 mg | Freq: Once | INTRAMUSCULAR | Status: DC

## 2013-10-25 NOTE — ED Notes (Signed)
Per pts husband, "something is wrong with her kidneys", Pt was at home sleeping and Pt started to shake, and breath deeply, Pts husband said that she was fighting with him "like crazy"

## 2013-10-25 NOTE — ED Notes (Signed)
Pt started taking diovan and Lipitor, Pt feels that it is her new medications

## 2013-10-25 NOTE — ED Notes (Signed)
Pt brought in by [facility], Pt c/o seizure and biting tongue, Pt glucose 111, Pt had no LOC, Pt has a little altered mental status

## 2013-10-25 NOTE — ED Provider Notes (Signed)
CSN: [id number]     Arrival date & time 10/25/13  0105 History   First MD Initiated Contact with Patient 10/25/13 0117     Chief Complaint  Patient presents with  . Seizures    The history is provided by the patient and the spouse.   patient is brought to the emergency department today after possible seizure while sleeping.  Her husband reports that he felt her shaking and when he turned on the light her shaking stopped but she was breathing very heavily.  He states that she was unresponsive to him at that time and it took approximately 10-15 minutes and so she returned back to baseline.  When she initially started coming back to she was extremely combative with EMS and seemed rather confused.  Patient now is without any complaints except for some mild right arm pain.  Family reports this is the second type episode this happened like this.  She seemed emergency department in early November 2014 for similar symptoms at that time had labs and a CT scan which all were without significant abnormality.  Seizure was suspected that time and she was told to follow up with neurology.  She's had in the EEG with the neurology office in December which demonstrated no seizure activity.  She was cleared at that time to start driving by her primary care physician.  However she has not had an official neurology office visit where she met the neurologist at this point.  The patient is convinced that this may be related to her medications.  EMS checked her blood sugar on arrival to her house and reported it was not low.  Patient denies headache at this time.  No bowel or bladder incontinence.  No weakness of her arms or legs.  Family agrees the patient's baseline mental status at this time.  Past Medical History  Diagnosis Date  . Thrombocytosis   . Hypertension   . Diabetes mellitus   . Gout   . Renal artery stenosis 12/13/2010    PTCA and Stent - right  . Syncopal episodes   . PAD (peripheral artery disease)   .  Hyperlipemia   . Morbid obesity   . LVH (left ventricular hypertrophy)     12/21/09 Echo -mild asymmetric LVH,EF =>55%   Past Surgical History  Procedure Laterality Date  . Breast reduction surgery  1995    S/P Bilateral breast reduction  . Cesarean section  1980, 1986    Times  Two.  . Breast cyst excision      S/P Benign Right Breast Cyst Removal.  . Partial hysterectomy  1994  . Extremity cyst excision  1993    left wrist   Family History  Problem Relation Age of Onset  . Breast cancer Maternal Aunt     2 maternal aunts had breast cancer.  Marland Kitchen Heart failure Mother   . Diabetes Father   . Diabetes Sister   . Hypertension Sister   . Breast cancer Maternal Grandmother   . Hypertension Sister   . Hypertension Sister    History  Substance Use Topics  . Smoking status: Never Smoker   . Smokeless tobacco: Never Used  . Alcohol Use: No   OB History   Grav Para Term Preterm Abortions TAB SAB Ect Mult Living                 Review of Systems  All other systems reviewed and are negative.    Allergies  Sulfa antibiotics  Home Medications   Current Outpatient Rx  Name  Route  Sig  Dispense  Refill  . aspirin 81 MG tablet   Oral   Take 81 mg by mouth daily.         Marland Kitchen atorvastatin (LIPITOR) 10 MG tablet   Oral   Take 10 mg by mouth at bedtime.          . cholecalciferol (VITAMIN D) 1000 UNITS tablet   Oral   Take 2,000 Units by mouth 2 (two) times daily.          . clopidogrel (PLAVIX) 75 MG tablet   Oral   Take 75 mg by mouth daily.         . hydroxyurea (HYDREA) 500 MG capsule   Oral   Take 500 mg by mouth 2 (two) times daily. May take with food to minimize GI side effects.         . nebivolol (BYSTOLIC) 5 MG tablet   Oral   Take 5 mg by mouth daily.         Marland Kitchen senna-docusate (SENOKOT-S) 8.6-50 MG per tablet   Oral   Take 1 tablet by mouth daily as needed for constipation.         . sitaGLIPtan-metformin (JANUMET) 50-500 MG per tablet    Oral   Take 1 tablet by mouth at bedtime.          . STUDY MEDICATION   Oral   Take 100 mg by mouth daily. Research study with Dr. Baird Cancer - pt may be receiving placebo or allopurinol Protocol [id number] 1 capsule [id number] 1 capsule         . valsartan (DIOVAN) 80 MG tablet   Oral   Take 80 mg by mouth at bedtime.         . levETIRAcetam (KEPPRA) 500 MG tablet   Oral   Take 1 tablet (500 mg total) by mouth 2 (two) times daily.   60 tablet   0    BP 147/67  Pulse 102  Temp(Src) 98.6 F (37 C) (Oral)  Resp 20  Ht _0  (1.575 m)  Wt 220 lb (99.791 kg)  BMI 40.23 kg/m2  SpO2 97% Physical Exam  Nursing note and vitals reviewed. Constitutional: She is oriented to person, place, and time. She appears well-developed and well-nourished. No distress.  HENT:  Head: Normocephalic and atraumatic.  Small bite to the tip of her tongue.  No active bleeding  Eyes: EOM are normal. Pupils are equal, round, and reactive to light.  Neck: Normal range of motion.  Cardiovascular: Normal rate, regular rhythm and normal heart sounds.   Pulmonary/Chest: Effort normal and breath sounds normal.  Abdominal: Soft. She exhibits no distension. There is no tenderness.  Musculoskeletal: Normal range of motion.  Neurological: She is alert and oriented to person, place, and time.  5/5 strength in major muscle groups of  bilateral upper and lower extremities. Speech normal. No facial asymetry.   Skin: Skin is warm and dry.  Psychiatric: She has a normal mood and affect. Judgment normal.    ED Course  Procedures (including critical care time) Labs Review Labs Reviewed - No data to display Imaging Review No results found.  EKG Interpretation   None       MDM   1. Seizure    Despite recent normal EEG her symptoms tonight are typical of seizure.  I had a phone conversation with our neurologist who agrees that the symptoms  sound typical and the patient will need to be started on  anticonvulsants.  Keppra 500 mg twice a day.  Neurology followup.  Strict driving precautions given and instructed that she will be unable to drive until cleared by neurology    Hoy Morn, MD 10/25/13 [phone]

## 2013-10-25 NOTE — Discharge Instructions (Signed)
Driving and Equipment Restrictions Some medical problems make it dangerous to drive, ride a bike, or use machines. Some of these problems are:  A hard blow to the head (concussion).  Passing out (fainting).  Twitching and shaking (seizures).  Low blood sugar.  Taking medicine to help you relax (sedatives).  Taking pain medicines.  Wearing an eye patch.  Wearing splints. This can make it hard to use parts of your body that you need to drive safely. HOME CARE   Do not drive until your doctor says it is okay.  Do not use machines until your doctor says it is okay. You may need a form signed by your doctor (medical release) before you can drive again. You may also need this form before you do other tasks where you need to be fully alert. MAKE SURE YOU:  Understand these instructions.  Will watch your condition.  Will get help right away if you are not doing well or get worse. Document Released: 11/17/2004 Document Revised: 01/02/2012 Document Reviewed: 02/17/2010 [facility] Patient Information 2014 [place].  Seizure, Adult A seizure is abnormal electrical activity in the brain. Seizures can cause a change in attention or behavior (altered mental status). Seizures often involve uncontrollable shaking (convulsions). Seizures usually last from 30 seconds to 2 minutes. Epilepsy is a brain disorder in which a patient has repeated seizures over time. CAUSES  There are many different problems that can cause seizures. In some cases, no cause is found. Common causes of seizures include:  Head injuries.  Brain tumors.  Infections.  Imbalance of chemicals in the blood.  Kidney failure or liver failure.  Heart disease.  Drug abuse.  Stroke.  Withdrawal from certain drugs or alcohol.  Birth defects.  Malfunction of a neurosurgical device placed in the brain. SYMPTOMS  Symptoms vary depending on the part of the brain that is involved. Right before a seizure, you may  have a warning (aura) that a seizure is about to occur. An aura may include the following symptoms:   Fear or anxiety.  Nausea.  Feeling like the room is spinning (vertigo).  Vision changes, such as seeing flashing lights or spots. Common symptoms during a seizure include:  Convulsions.  Drooling.  Rapid eye movements.  Grunting.  Loss of bladder and bowel control.  Bitter taste in the mouth. After a seizure, you may feel confused and sleepy. You may also have an injury resulting from convulsions during the seizure. DIAGNOSIS  Your caregiver will perform a physical exam and run some tests to determine the type and cause of your seizure. These tests may include:  Blood tests.  A lumbar puncture test. In this test, a small amount of fluid is removed from the spine and examined.  Electrocardiography (ECG). This test records the electrical activity in your heart.  Imaging tests, such as computed tomography (CT) scans or magnetic resonance imaging (MRI).  Electroencephalography (EEG). This test records the electrical activity in your brain. TREATMENT  Seizures usually stop on their own. Treatment will depend on the cause of your seizure. In some cases, medicine may be given to prevent future seizures. HOME CARE INSTRUCTIONS   If you are given medicines, take them exactly as prescribed by your caregiver.  Keep all follow-up appointments as directed by your caregiver.  Do not swim or drive until your caregiver says it is okay.  Teach friends and family what to do if you have a seizure. They should:  Lay you on the ground to  prevent a fall.  Put a cushion under your head.  Loosen any tight clothing around your neck.  Turn you on your side. If vomiting occurs, this helps keep your airway clear.  Stay with you until you recover. SEEK IMMEDIATE MEDICAL CARE IF:  The seizure lasts longer than 2 to 5 minutes.  The seizure is severe or the person does not wake up after  the seizure.  The person has altered mental status. Drive the person to the emergency department or call your local emergency services (911 in [place] MAKE SURE YOU:  Understand these instructions.  Will watch your condition.  Will get help right away if you are not doing well or get worse. Document Released: 10/07/2000 Document Revised: 01/02/2012 Document Reviewed: 09/28/2011 [facility] Patient Information 2014 [place], [place].

## 2013-10-27 ENCOUNTER — Encounter (HOSPITAL_COMMUNITY): Payer: Self-pay | Admitting: Emergency Medicine

## 2013-10-27 ENCOUNTER — Emergency Department (INDEPENDENT_AMBULATORY_CARE_PROVIDER_SITE_OTHER)
Admission: EM | Admit: 2013-10-27 | Discharge: 2013-10-27 | Disposition: A | Discharge status: Home / Self Care | Payer: BC Managed Care – PPO | Source: Home / Self Care

## 2013-10-27 DIAGNOSIS — T148XXA Other injury of unspecified body region, initial encounter: Secondary | ICD-10-CM
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
## 2013-10-27 NOTE — ED Notes (Signed)
Follow up from ER 10/25/13 States she has seizures  Doesn't remember too much about that night.  States she has bruise all over her left arm Patient does take plavix and asa daily PCP has referred patient to neurologist

## 2013-10-27 NOTE — Discharge Instructions (Signed)
° ° °  Hang in there and f/u with Neuro. Bruises should improve in time.

## 2013-10-28 ENCOUNTER — Ambulatory Visit (INDEPENDENT_AMBULATORY_CARE_PROVIDER_SITE_OTHER): Payer: BC Managed Care – PPO | Admitting: Neurology

## 2013-10-28 ENCOUNTER — Encounter: Payer: Self-pay | Admitting: Neurology

## 2013-10-28 ENCOUNTER — Telehealth: Payer: Self-pay | Admitting: *Deleted

## 2013-10-28 VITALS — BP 165/80 | HR 80 | Ht 62.0 in | Wt 226.0 lb

## 2013-10-28 DIAGNOSIS — G4733 Obstructive sleep apnea (adult) (pediatric): Secondary | ICD-10-CM

## 2013-10-28 DIAGNOSIS — R55 Syncope and collapse: Secondary | ICD-10-CM

## 2013-10-28 NOTE — Patient Instructions (Signed)
Syncope  Syncope is a fainting spell. This means the person loses consciousness and drops to the ground. The person is generally unconscious for less than 5 minutes. The person may have some muscle twitches for up to 15 seconds before waking up and returning to normal. Syncope occurs more often in elderly people, but it can happen to anyone. While most causes of syncope are not dangerous, syncope can be a sign of a serious medical problem. It is important to seek medical care.   CAUSES   Syncope is caused by a sudden decrease in blood flow to the brain. The specific cause is often not determined. Factors that can trigger syncope include:   Taking medicines that lower blood pressure.   Sudden changes in posture, such as standing up suddenly.   Taking more medicine than prescribed.   Standing in one place for too long.   Seizure disorders.   Dehydration and excessive exposure to heat.   Low blood sugar (hypoglycemia).   Straining to have a bowel movement.   Heart disease, irregular heartbeat, or other circulatory problems.   Fear, emotional distress, seeing blood, or severe pain.  SYMPTOMS   Right before fainting, you may:   Feel dizzy or lightheaded.   Feel nauseous.   See all white or all black in your field of vision.   Have cold, clammy skin.  DIAGNOSIS   Your caregiver will ask about your symptoms, perform a physical exam, and perform electrocardiography (ECG) to record the electrical activity of your heart. Your caregiver may also perform other heart or blood tests to determine the cause of your syncope.  TREATMENT   In most cases, no treatment is needed. Depending on the cause of your syncope, your caregiver may recommend changing or stopping some of your medicines.  HOME CARE INSTRUCTIONS   Have someone stay with you until you feel stable.   Do not drive, operate machinery, or play sports until your caregiver says it is okay.   Keep all follow-up appointments as directed by your  caregiver.   Lie down right away if you start feeling like you might faint. Breathe deeply and steadily. Wait until all the symptoms have passed.   Drink enough fluids to keep your urine clear or pale yellow.   If you are taking blood pressure or heart medicine, get up slowly, taking several minutes to sit and then stand. This can reduce dizziness.  SEEK IMMEDIATE MEDICAL CARE IF:    You have a severe headache.   You have unusual pain in the chest, abdomen, or back.   You are bleeding from the mouth or rectum, or you have black or tarry stool.   You have an irregular or very fast heartbeat.   You have pain with breathing.   You have repeated fainting or seizure-like jerking during an episode.   You faint when sitting or lying down.   You have confusion.   You have difficulty walking.   You have severe weakness.   You have vision problems.  If you fainted, call your local emergency services (911 in [place] Do not drive yourself to the hospital.   MAKE SURE YOU:   Understand these instructions.   Will watch your condition.   Will get help right away if you are not doing well or get worse.  Document Released: 10/10/2005 Document Revised: 04/10/2012 Document Reviewed: 12/09/2011  [employer] Patient Information 2014 [employer]

## 2013-10-28 NOTE — Progress Notes (Signed)
Reason for visit: Possible seizure  Sara [name] is a 58 y.o. female  History of present illness:  Sara [name] is a 58 year old right-handed black female with a history of sleep apnea, currently not on therapy. The patient could not tolerate the mask, and she has not been using the CPAP machine for over a year. The patient snores at night, and the husband has taken her to the emergency room on 2 occasions within the last several months. The patient had an event in November 2014 when she was noted to have a change in her breathing pattern. The patient did not have any observable stiffening or jerking, but the patient did bite her tongue. The patient was confused for 10 or 15 minutes following this event, and an evaluation in the emergency room revealed an unremarkable CT scan of brain. The patient was set up as an outpatient for an EEG study that was normal. The patient was not placed on seizure medications. The patient did not lose control of the bowels or the bladder. On 10/25/2013, the patient had a similar event, unassociated with tongue biting or bowel or bladder incontinence. The patient was confused, agitated and combative for about 15 minutes. The patient indicates that she has some recollection of events during this time. Once again, the patient was not noted to have any jerking or stiffening. The episode came out of sleep. The patient reports no focal numbness or weakness of the face, arms, or legs. The patient denies any balance issues. The patient denies any headaches or dizziness. The patient is sent to this office for further evaluation. The patient was given Keppra, but she is not taking the medication.  Past Medical History  Diagnosis Date  . Thrombocytosis   . Hypertension   . Diabetes mellitus   . Gout   . Renal artery stenosis 12/13/2010    PTCA and Stent - right  . Syncopal episodes   . PAD (peripheral artery disease)   . Hyperlipemia   . Morbid obesity   . LVH (left  ventricular hypertrophy)     12/21/09 Echo -mild asymmetric LVH,EF =>55%  . OSA (obstructive sleep apnea)     Past Surgical History  Procedure Laterality Date  . Breast reduction surgery  1995    S/P Bilateral breast reduction  . Cesarean section  1980, 1986    Times  Two.  . Breast cyst excision      S/P Benign Right Breast Cyst Removal.  . Partial hysterectomy  1994  . Extremity cyst excision  1993    left wrist  . Renal artery stent placement Right     Family History  Problem Relation Age of Onset  . Breast cancer Maternal Aunt     2 maternal aunts had breast cancer.  Marland Kitchen Heart failure Mother   . Diabetes Father   . Diabetes Sister   . Hypertension Sister   . Breast cancer Maternal Grandmother   . Hypertension Sister   . Hypertension Sister     Social history:  reports that she has never smoked. She has never used smokeless tobacco. She reports that she does not drink alcohol or use illicit drugs.  Medications:  Current Outpatient Prescriptions on File Prior to Visit  Medication Sig Dispense Refill  . aspirin 81 MG tablet Take 81 mg by mouth daily.      Marland Kitchen atorvastatin (LIPITOR) 10 MG tablet Take 10 mg by mouth at bedtime.       Marland Kitchen  cholecalciferol (VITAMIN D) 1000 UNITS tablet Take 2,000 Units by mouth 2 (two) times daily.       . clopidogrel (PLAVIX) 75 MG tablet Take 75 mg by mouth daily.      . hydroxyurea (HYDREA) 500 MG capsule Take 500 mg by mouth 2 (two) times daily. May take with food to minimize GI side effects.      . nebivolol (BYSTOLIC) 5 MG tablet Take 5 mg by mouth daily.      Marland Kitchen senna-docusate (SENOKOT-S) 8.6-50 MG per tablet Take 1 tablet by mouth daily as needed for constipation.      . sitaGLIPtan-metformin (JANUMET) 50-500 MG per tablet Take 1 tablet by mouth at bedtime.       . STUDY MEDICATION Take 100 mg by mouth daily. Research study with Dr. Baird Cancer - pt may be receiving placebo or allopurinol Protocol [id number] 1 capsule [id number] 1 capsule       . valsartan (DIOVAN) 80 MG tablet Take 80 mg by mouth at bedtime.      . levETIRAcetam (KEPPRA) 500 MG tablet Take 1 tablet (500 mg total) by mouth 2 (two) times daily.  60 tablet  0   No current facility-administered medications on file prior to visit.      Allergies  Allergen Reactions  . Sulfa Antibiotics Itching    ROS:  Out of a complete 14 system review of symptoms, the patient complains only of the following symptoms, and all other reviewed systems are negative.  Easy bruising Snoring   Blood pressure 165/80, pulse 80, height 5\' 2"  (1.575 m), weight 226 lb (102.513 kg).  Physical Exam  General: The patient is alert and cooperative at the time of the examination. The patient is moderately obese.  Eyes: Pupils are equal, round, and reactive to light. Discs are flat bilaterally.  Neck: The neck is supple, no carotid bruits are noted.  Respiratory: The respiratory examination is clear.  Cardiovascular: The cardiovascular examination reveals a regular rate and rhythm, no obvious murmurs or rubs are noted.  Neuromuscular: The patient has incomplete abduction of the right shoulder and arm with associated pain. Range of motion of the left shoulder is normal.  Skin: Extremities are without significant edema.  Neurologic Exam  Mental status: The patient is alert and oriented x 3 at the time of the examination. The patient has apparent normal recent and remote memory, with an apparently normal attention span and concentration ability.  Cranial nerves: Facial symmetry is present. There is good sensation of the face to pinprick and soft touch bilaterally. The strength of the facial muscles and the muscles to head turning and shoulder shrug are normal bilaterally. Speech is well enunciated, no aphasia or dysarthria is noted. Extraocular movements are full. Visual fields are full. The tongue is midline, and the patient has symmetric elevation of the soft palate. No obvious  hearing deficits are noted.  Motor: The motor testing reveals 5 over 5 strength of all 4 extremities. Good symmetric motor tone is noted throughout.  Sensory: Sensory testing is intact to pinprick, soft touch, vibration sensation, and position sense on all 4 extremities. No evidence of extinction is noted.  Coordination: Cerebellar testing reveals good finger-nose-finger and heel-to-shin bilaterally.  Gait and station: Gait is normal. Tandem gait is normal. Romberg is negative. No drift is seen.  Reflexes: Deep tendon reflexes are symmetric and normal bilaterally. Toes are downgoing bilaterally.   Assessment/Plan:  1. Possible nocturnal seizures  2. Sleep apnea, untreated  The  patient has had 2 events that could have represented seizures coming out of sleep. The patient has untreated sleep apnea, and this could potentially be a cause of seizures. The patient is on hydroxyurea, and this may lower the seizure threshold. The patient will be set up for a sleep deprived EEG study. The patient is not to drive until further notice. The patient currently is not taking the [place]. MRI evaluation of the brain will be done, and the patient will be referred to a sleep physician for better management of the sleep apnea. The patient followup in 2-3 months.  Jill Alexanders MD 10/28/2013 7:13 PM  [facility] [address] [place], [place] [address]  Phone [phone] Fax [phone]

## 2013-10-28 NOTE — ED Provider Notes (Signed)
Medical screening examination/treatment/procedure(s) were performed by a resident physician or non-physician practitioner and as the supervising physician I was immediately available for consultation/collaboration.  Lynne Leader, MD    Gregor Hams, MD 10/28/13 2107

## 2013-10-29 ENCOUNTER — Telehealth: Payer: Self-pay

## 2013-10-29 ENCOUNTER — Other Ambulatory Visit: Payer: Self-pay | Admitting: Internal Medicine

## 2013-10-29 ENCOUNTER — Telehealth: Payer: Self-pay | Admitting: Internal Medicine

## 2013-10-29 NOTE — Telephone Encounter (Signed)
Pt called earlier about bruising on her arms and R leg. She had been to ER on 10/27/13 with seizure activity and the note said she had to be physically restrained. last labs drawn 10/08/13 Pt states bruises are getting worse and her coworkers are noticing them. S/w Dr Juliann Mule and rescheduled pt from 1/13 to 1/8 at 1 pm for lab and 130 pm with Dr Juliann Mule.

## 2013-10-29 NOTE — Telephone Encounter (Signed)
per 1/6 pof moved 1/13 lb/fu tp 1/8. called pt home number and was asked by relative to call her at work - relative states this is ok. called work number and lmonvm for pt re appt for 1/8 @ 1pm. pt personally identified on vm. asked pt to call me back to confirm.

## 2013-10-31 ENCOUNTER — Ambulatory Visit (HOSPITAL_BASED_OUTPATIENT_CLINIC_OR_DEPARTMENT_OTHER): Payer: BC Managed Care – PPO | Admitting: Internal Medicine

## 2013-10-31 ENCOUNTER — Other Ambulatory Visit (HOSPITAL_BASED_OUTPATIENT_CLINIC_OR_DEPARTMENT_OTHER): Payer: BC Managed Care – PPO

## 2013-10-31 ENCOUNTER — Ambulatory Visit: Payer: BC Managed Care – PPO

## 2013-10-31 ENCOUNTER — Telehealth: Payer: Self-pay | Admitting: Internal Medicine

## 2013-10-31 ENCOUNTER — Encounter: Payer: Self-pay | Admitting: Internal Medicine

## 2013-10-31 VITALS — BP 127/75 | HR 87 | Temp 97.8°F | Resp 18 | Ht 62.0 in | Wt 228.1 lb

## 2013-10-31 DIAGNOSIS — D473 Essential (hemorrhagic) thrombocythemia: Secondary | ICD-10-CM

## 2013-10-31 DIAGNOSIS — IMO0002 Reserved for concepts with insufficient information to code with codable children: Secondary | ICD-10-CM

## 2013-10-31 DIAGNOSIS — R569 Unspecified convulsions: Secondary | ICD-10-CM

## 2013-10-31 DIAGNOSIS — G4733 Obstructive sleep apnea (adult) (pediatric): Secondary | ICD-10-CM

## 2013-10-31 LAB — COMPREHENSIVE METABOLIC PANEL (CC13)
ALT: 19 U/L (ref 0–55)
ANION GAP: 11 meq/L (ref 3–11)
AST: 19 U/L (ref 5–34)
Albumin: 3.5 g/dL (ref 3.5–5.0)
Alkaline Phosphatase: 79 U/L (ref 40–150)
BUN: 12.4 mg/dL (ref 7.0–26.0)
CALCIUM: 9.5 mg/dL (ref 8.4–10.4)
CHLORIDE: 106 meq/L (ref 98–109)
CO2: 29 mEq/L (ref 22–29)
CREATININE: 1.1 mg/dL (ref 0.6–1.1)
Glucose: 95 mg/dl (ref 70–140)
Potassium: 3.7 mEq/L (ref 3.5–5.1)
Sodium: 145 mEq/L (ref 136–145)
Total Bilirubin: 0.66 mg/dL (ref 0.20–1.20)
Total Protein: 7 g/dL (ref 6.4–8.3)

## 2013-10-31 LAB — CBC WITH DIFFERENTIAL/PLATELET
BASO%: 0.6 % (ref 0.0–2.0)
BASOS ABS: 0 10*3/uL (ref 0.0–0.1)
EOS%: 3.8 % (ref 0.0–7.0)
Eosinophils Absolute: 0.2 10*3/uL (ref 0.0–0.5)
HEMATOCRIT: 36.8 % (ref 34.8–46.6)
HGB: 12.4 g/dL (ref 11.6–15.9)
LYMPH#: 1.5 10*3/uL (ref 0.9–3.3)
LYMPH%: 30.8 % (ref 14.0–49.7)
MCH: 36.6 pg — AB (ref 25.1–34.0)
MCHC: 33.7 g/dL (ref 31.5–36.0)
MCV: 108.6 fL — AB (ref 79.5–101.0)
MONO#: 0.4 10*3/uL (ref 0.1–0.9)
MONO%: 7.1 % (ref 0.0–14.0)
NEUT#: 2.9 10*3/uL (ref 1.5–6.5)
NEUT%: 57.7 % (ref 38.4–76.8)
PLATELETS: 359 10*3/uL (ref 145–400)
RBC: 3.39 10*6/uL — ABNORMAL LOW (ref 3.70–5.45)
RDW: 12.6 % (ref 11.2–14.5)
WBC: 4.9 10*3/uL (ref 3.9–10.3)

## 2013-10-31 NOTE — Telephone Encounter (Signed)
gv and printed appt sched na davs for pt for March and May

## 2013-11-01 ENCOUNTER — Telehealth: Payer: Self-pay | Admitting: Neurology

## 2013-11-01 ENCOUNTER — Ambulatory Visit (INDEPENDENT_AMBULATORY_CARE_PROVIDER_SITE_OTHER): Payer: BC Managed Care – PPO | Admitting: Radiology

## 2013-11-01 DIAGNOSIS — G4733 Obstructive sleep apnea (adult) (pediatric): Secondary | ICD-10-CM

## 2013-11-01 DIAGNOSIS — R55 Syncope and collapse: Secondary | ICD-10-CM

## 2013-11-01 NOTE — Telephone Encounter (Signed)
I called patient. The MRI of the brain was unremarkable. The EEG study was unremarkable as well. If the patient continues to have episodes of stiffening or unresponsiveness at night, I will initiate treatment with medication such as Keppra.

## 2013-11-01 NOTE — Telephone Encounter (Signed)
I called the patient. The EEG study was unremarkable, the study was set up as a sleep deprived evaluation, but the patient got 5 out of sleep before the study, did not sleep during the study. MRI the brain is pending.

## 2013-11-01 NOTE — Procedures (Signed)
    History:  [name] is a 58 year old patient with a history of 2 events that occurred out of sleep associated with stiffening and confusion lasting about 15 minutes. One event that occurred in November 2014 was associated with tongue biting. The second event was not associated with tongue biting. No incontinence of bowel or bladder were noted. The patient is being evaluated for possible seizures. The patient has untreated obstructive sleep apnea.  This is a routine EEG. No skull defects are noted. Medications include aspirin, Lipitor, vitamin D, Plavix, hydroxyurea, Bystolic, Diovan, and vitamin C.  EEG was ordered as a sleep deprived recording. The patient slept 5 hours the evening prior to this study.   EEG classification: Normal awake and drowsy  Description of the recording: The background rhythms of this recording consists of a fairly well modulated medium amplitude alpha rhythm of 8 Hz that is reactive to eye opening and closure. As the record progresses, the patient appears to remain in the waking state throughout the recording. Photic stimulation was performed, resulting in a bilateral and symmetric photic driving response. Hyperventilation was also performed, resulting in a minimal buildup of the background rhythm activities without significant slowing seen. Toward the end of the recording, the patient enters the drowsy state with slight symmetric slowing seen. The patient never enters stage II sleep. At no time during the recording does there appear to be evidence of spike or spike wave discharges or evidence of focal slowing. EKG monitor shows no evidence of cardiac rhythm abnormalities with a heart rate of 72.  Impression: This is a normal EEG recording in the waking and drowsy state. No evidence of ictal or interictal discharges are seen.

## 2013-11-01 NOTE — Telephone Encounter (Signed)
Error

## 2013-11-02 NOTE — Progress Notes (Signed)
[facility] OFFICE PROGRESS NOTE  Sara Greenland, MD [address] [place] [address]  DIAGNOSIS: Essential thrombocytosis - Plan: Comprehensive metabolic panel (Cmet) - [facility], CBC with Differential, CBC with Differential in 2 months  History of renal stent  OSA (obstructive sleep apnea)  CC: Essential Thrombocytosis  CURRENT THERAPY: Hydroxyurea 1000 mg daily as of 03/19/2013.  Aspirin 81 mg daily.  INTERVAL HISTORY: Sara [name] 58 y.o. female with history of JAK positive Essential thrombocythemia presents for four month follow-up.  She was last seen by me on 07/16/2013.  She had a colonoscopy in 01/2012.  She is accompanied by her husband Chauncey Mann.  She has had multiple hospitalizations since her last visit.  She reports in November 2014, She had a questionable seizure episode and was evaluated by neurology with normal EEG.  She was on keppra.  She reports a second episode on 10/25/2013 with a period of disorientation and aggressive behavior and breathing difficulties observed by her husband.  Both episodes occurred when she was very fatigued.  She had imaging of her head which was reportedly negative.  She is scheduled for a sleep-deprived EEG by neurology. Otherwise, she reports that she is doing very well.  She denies any bleeding from her gums or easy bruising.    MEDICAL HISTORY: Past Medical History  Diagnosis Date  . Thrombocytosis   . Hypertension   . Diabetes mellitus   . Gout   . Renal artery stenosis 12/13/2010    PTCA and Stent - right  . Syncopal episodes   . PAD (peripheral artery disease)   . Hyperlipemia   . Morbid obesity   . LVH (left ventricular hypertrophy)     12/21/09 Echo -mild asymmetric LVH,EF =>55%  . OSA (obstructive sleep apnea)    INTERIM HISTORY: has Essential thrombocytosis; Hypertension; Type II or unspecified type diabetes mellitus without mention of complication, not stated as uncontrolled; Acute renal  failure; History of renal stent; Orthostatic hypotension; Diastolic CHF; Syncope; and OSA (obstructive sleep apnea) on her problem list.    ALLERGIES:  is allergic to sulfa antibiotics.  MEDICATIONS: has a current medication list which includes the following prescription(s): aspirin, atorvastatin, cholecalciferol, clopidogrel, hydroxyurea, nebivolol, STUDY MEDICATION, valsartan, and vitamin c.  SURGICAL HISTORY:  Past Surgical History  Procedure Laterality Date  . Breast reduction surgery  1995    S/P Bilateral breast reduction  . Cesarean section  1980, 1986    Times  Two.  . Breast cyst excision      S/P Benign Right Breast Cyst Removal.  . Partial hysterectomy  1994  . Extremity cyst excision  1993    left wrist  . Renal artery stent placement Right     REVIEW OF SYSTEMS:   Constitutional: Denies fevers, chills or abnormal weight loss Eyes: Denies blurriness of vision Ears, nose, mouth, throat, and face: Denies mucositis or sore throat Respiratory: Denies cough, dyspnea or wheezes Cardiovascular: Denies palpitation, chest discomfort or lower extremity swelling Gastrointestinal:  Denies nausea, heartburn ; she has constipation.  Skin: Denies abnormal skin rashes Lymphatics: Denies new lymphadenopathy or easy bruising Neurological:Denies numbness, tingling or new weaknesses Behavioral/Psych: Mood is stable, no new changes  All other systems were reviewed with the patient and are negative.  PHYSICAL EXAMINATION: ECOG PERFORMANCE STATUS: 0 - Asymptomatic  Blood pressure 127/75, pulse 87, temperature 97.8 F (36.6 C), temperature source Oral, resp. rate 18, height 5\' 2"  (1.575 m), weight 228 lb 1.6 oz (103.465 kg).  GENERAL:alert, no distress and comfortable; obese pleasant middle-age woman SKIN: skin color, texture, turgor are normal, no rashes or significant lesions EYES: normal, Conjunctiva are pink and non-injected, sclera clear OROPHARYNX:no exudate, no erythema and  lips, buccal mucosa, and tongue normal  NECK: supple, thyroid normal size, non-tender, without nodularity LYMPH:  no palpable lymphadenopathy in the cervical, axillary or inguinal LUNGS: clear to auscultation and percussion with normal breathing effort HEART: regular rate & rhythm and no murmurs and no lower extremity edema ABDOMEN:abdomen soft,  Mildly tender in the epigastric and normal bowel sounds Musculoskeletal:no cyanosis of digits and no clubbing  NEURO: alert & oriented x 3 with fluent speech, no focal motor/sensory deficits   LABORATORY DATA: No results found for this or any previous visit (from the past 48 hour(s)).  CMP     Component Value Date/Time   NA 145 10/31/2013 1301   NA 140 08/24/2013 2346   K 3.7 10/31/2013 1301   K 3.3* 08/24/2013 2346   CL 102 08/24/2013 2346   CL 102 03/19/2013 1535   CO2 29 10/31/2013 1301   CO2 25 08/24/2013 2346   GLUCOSE 95 10/31/2013 1301   GLUCOSE 108* 08/24/2013 2346   GLUCOSE 117* 03/19/2013 1535   BUN 12.4 10/31/2013 1301   BUN 16 08/24/2013 2346   CREATININE 1.1 10/31/2013 1301   CREATININE 1.12* 08/24/2013 2346   CALCIUM 9.5 10/31/2013 1301   CALCIUM 9.2 08/24/2013 2346   PROT 7.0 10/31/2013 1301   PROT 6.6 08/24/2013 2346   ALBUMIN 3.5 10/31/2013 1301   ALBUMIN 3.3* 08/24/2013 2346   AST 19 10/31/2013 1301   AST 20 08/24/2013 2346   ALT 19 10/31/2013 1301   ALT 16 08/24/2013 2346   ALKPHOS 79 10/31/2013 1301   ALKPHOS 64 08/24/2013 2346   BILITOT 0.66 10/31/2013 1301   BILITOT 0.3 08/24/2013 2346   GFRNONAA 53* 08/24/2013 2346   GFRAA 61* 08/24/2013 2346    RADIOGRAPHIC STUDIES: No results found.  ASSESSMENT: Essential thrombocythemia. Diagnosed in 2010. JAK2 mutation was present. The patient underwent a bone marrow aspirate and biopsy on 01/20/2009 which yielded a specimen that was suboptimal for evaluation. However, megakaryocytes were abundant with normal  morphology. There was no clustering or excess blasts seen. Platelet count on 01/20/2009 was 881.  The patient is being treated with aspirin and hydroxyurea.  PLAN:  1. Essential Thrombocythemia. --She should continue hydroxyurea dose to 1000 mg daily. We will continue to check CBC every other month. The patient had a mammogram on 1027/2014 which was negative for malignancy.  2. OSA/Sleep problems/ Questionable seizures --Patient will follow-up for sleep deprived EEG to rule out seizure episode.  She reports non-compliance to CPAP for her OSA due to poor fitting.  We discussed the importance of compliance as this might contributing to her recent episodes due to poor sleep quality.   3. Follow-up.  We will have her return in about 4 months at which time we will check CBC and chemistries.  --All questions were answered. The patient knows to call the clinic with any problems, questions or concerns. We can certainly see the patient much sooner if necessary.  --I spent 15 minutes counseling the patient face to face. The total time spent in the appointment was 25 minutes.    Khalin Royce, MD 11/02/2013 6:27 PM

## 2013-11-05 ENCOUNTER — Telehealth: Payer: Self-pay | Admitting: Nurse Practitioner

## 2013-11-05 ENCOUNTER — Ambulatory Visit: Payer: BC Managed Care – PPO

## 2013-11-05 ENCOUNTER — Other Ambulatory Visit: Payer: BC Managed Care – PPO

## 2013-11-05 ENCOUNTER — Telehealth: Payer: Self-pay | Admitting: Neurology

## 2013-11-05 NOTE — Telephone Encounter (Signed)
That appt had been changed from 4/6 to 4/9

## 2013-11-05 NOTE — Telephone Encounter (Signed)
Patient calling for test results. °

## 2013-11-05 NOTE — Telephone Encounter (Signed)
Please advise 

## 2013-11-05 NOTE — Telephone Encounter (Signed)
I called patient. The EEG study and the MRI were normal. The patient never got my other messages. She did not listen to her answering machine. The patient if has been taken off of janumet. Her hemoglobin A1c was 5.7. Her physician was concerned that she may be having low sugar episodes. I have indicated she should not drive for several more weeks to determine whether these events will recur or not. If they do recur, I will consider initiation of Keppra.

## 2013-11-11 ENCOUNTER — Encounter (INDEPENDENT_AMBULATORY_CARE_PROVIDER_SITE_OTHER): Payer: Self-pay

## 2013-11-11 ENCOUNTER — Ambulatory Visit (INDEPENDENT_AMBULATORY_CARE_PROVIDER_SITE_OTHER): Payer: BC Managed Care – PPO | Admitting: Neurology

## 2013-11-11 ENCOUNTER — Encounter: Payer: Self-pay | Admitting: Neurology

## 2013-11-11 VITALS — BP 153/79 | HR 79 | Temp 98.4°F | Ht 61.5 in | Wt 224.0 lb

## 2013-11-11 DIAGNOSIS — G4733 Obstructive sleep apnea (adult) (pediatric): Secondary | ICD-10-CM

## 2013-11-11 DIAGNOSIS — R569 Unspecified convulsions: Secondary | ICD-10-CM

## 2013-11-11 NOTE — Patient Instructions (Addendum)

## 2013-11-11 NOTE — Progress Notes (Signed)
Subjective:    Patient ID: Sara [name] is a 58 y.o. female.  HPI  Star Age, MD, PhD [facility] [address], [address] [place], [place] [address]  Dear Lanny Hurst,   I saw your patient, Sara [name], upon your kind request in my clinic today for initial consultation of her sleep disorder, in particular her prior diagnosis of OSA. The patient is accompanied by her husband today. As you know, Sara [name] is a 58 year old right-handed woman with an underlying medical history of thrombocytosis, hypertension, type 2 diabetes, gout, renal artery stenosis, s/p stent placement in 2013, peripheral artery disease, hyperlipidemia, obesity, left ventricular hypertrophy on echocardiogram, and prior diagnosis of obstructive sleep apnea, who stopped using her CPAP machine over a year ago. She states that she could not stand the CPAP machine and found it very cumbersome to use. She also complains of the pressure being too high. Unfortunately I am not sure how much pressure she was on. She did not come back for second sleep study so I'm assuming that she was placed on auto PAP. She use a nasal pillows mask but still found this very interfering with her sleep. She does endorse snoring and witnessed apneas. You had recently seen her for concern for seizure disorder with 2 recent seizure-like events reported, the first in November 2014 and another event earlier this month. Workup thus far included a CTH, brain MRI and EEG which were unremarkable. You have advised her not to drive. I reviewed her sleep study report from 11/26/2010, which was performed at the [facility]: Sleep efficiency was reduced at 54.95% with a sleep latency of 9 minutes and REM latency normal at 130 minutes. She had a mildly elevated arousal index of 10.7 per hour. She had 6 obstructive apneas and 57 hypopneas. Her overall AHI was 16.4 per hour, rising to 35.3 per hour and REM sleep. The  average oxygen saturation was 93% and her nadir was 81%. Mild snoring was reported. She had mild periodic leg movements of sleep at 17.2 per hour with an associated arousal index of 3.4 per hour. She lost about 15 lb since her sleep study nearly 3 years ago.  Her typical bedtime is reported to be around 8 to 8:30 PM and usual wake time is around 6 AM. Sleep onset typically occurs within 30-60 minutes. She reports feeling fairly well rested upon awakening. She wakes up on an average 3 to 4 times in the middle of the night and has to go to the bathroom 3 to 4 times on a typical night. She denies morning headaches.  She denies excessive daytime somnolence (EDS) and Her Epworth Sleepiness Score (ESS) is 5/24 today. She has not fallen asleep while driving. The patient has been taking a planned nap only on weekends. She feels somewhat better after a nap.  Her son lives with them and her sister lives with them too.  She has been known to snore for the past many years. Snoring is reportedly moderate, and associated with choking sounds and witnessed apneas. The patient denies a sense of choking or strangling feeling. There is no report of nighttime reflux, with no nighttime cough experienced. The patient has not noted any RLS symptoms and is not known to kick while asleep or before falling asleep. There is a family history of OSA in her sister.  She is not a very restless sleeper.   She denies cataplexy, sleep paralysis, hypnagogic or hypnopompic hallucinations,  or sleep attacks. She does not report any vivid dreams, nightmares, dream enactments, or parasomnias, such as sleep talking or sleep walking.   She consumes 0 caffeinated beverages per day.  Her bedroom is usually dark and cool. There is a TV in the bedroom and usually it is not on at night.   Her Past Medical History Is Significant For: Past Medical History  Diagnosis Date  . Thrombocytosis   . Hypertension   . Diabetes mellitus   . Gout   . Renal  artery stenosis 12/13/2010    PTCA and Stent - right  . Syncopal episodes   . PAD (peripheral artery disease)   . Hyperlipemia   . Morbid obesity   . LVH (left ventricular hypertrophy)     12/21/09 Echo -mild asymmetric LVH,EF =>55%  . OSA (obstructive sleep apnea)     Her Past Surgical History Is Significant For: Past Surgical History  Procedure Laterality Date  . Breast reduction surgery  1995    S/P Bilateral breast reduction  . Cesarean section  1980, 1986    Times  Two.  . Breast cyst excision      S/P Benign Right Breast Cyst Removal.  . Partial hysterectomy  1994  . Extremity cyst excision  1993    left wrist  . Renal artery stent placement Right     Her Family History Is Significant For: Family History  Problem Relation Age of Onset  . Breast cancer Maternal Aunt     2 maternal aunts had breast cancer.  Marland Kitchen Heart failure Mother   . Diabetes Father   . Diabetes Sister   . Hypertension Sister   . Breast cancer Maternal Grandmother   . Hypertension Sister   . Hypertension Sister     Her Social History Is Significant For: History   Social History  . Marital Status: Married    Spouse Name: N/A    Number of Children: 2  . Years of Education: COLLEGE   Occupational History  . [employer].    Social History Main Topics  . Smoking status: Never Smoker   . Smokeless tobacco: Never Used  . Alcohol Use: No  . Drug Use: No  . Sexual Activity: None   Other Topics Concern  . None   Social History Narrative  . None    Her Allergies Are:  Allergies  Allergen Reactions  . Sulfa Antibiotics Itching  :   Her Current Medications Are:  Outpatient Encounter Prescriptions as of 11/11/2013  Medication Sig  . aspirin 81 MG tablet Take 81 mg by mouth daily.  Marland Kitchen atorvastatin (LIPITOR) 10 MG tablet Take 10 mg by mouth at bedtime.   . cholecalciferol (VITAMIN D) 1000 UNITS tablet Take 2,000 Units by mouth 2 (two) times daily.   . clopidogrel (PLAVIX) 75 MG  tablet Take 75 mg by mouth daily.  . hydroxyurea (HYDREA) 500 MG capsule Take 500 mg by mouth 2 (two) times daily. May take with food to minimize GI side effects.  . nebivolol (BYSTOLIC) 5 MG tablet Take 5 mg by mouth daily.  . STUDY MEDICATION Take 100 mg by mouth daily. Research study with Dr. Baird Cancer - pt may be receiving placebo or allopurinol Protocol [id number] 1 capsule [id number] 1 capsule  . valsartan (DIOVAN) 80 MG tablet Take 80 mg by mouth at bedtime.  . vitamin C (ASCORBIC ACID) 500 MG tablet Take 500 mg by mouth daily.  :  Review of Systems:  Out of a  complete 14 point review of systems, all are reviewed and negative with the exception of these symptoms as listed below:   Review of Systems  Constitutional: Negative.   HENT: Negative.   Eyes: Negative.   Respiratory: Negative.   Cardiovascular: Negative.   Gastrointestinal: Negative.   Endocrine: Negative.   Genitourinary: Negative.   Musculoskeletal: Negative.   Skin: Negative.   Allergic/Immunologic: Negative.   Neurological: Negative.   Hematological: Bruises/bleeds easily.  Psychiatric/Behavioral: Positive for sleep disturbance (snoring).    Objective:  Neurologic Exam  Physical Exam Physical Examination:   Filed Vitals:   11/11/13 0859  BP: 153/79  Pulse: 79  Temp: 98.4 F (36.9 C)   General Examination: The patient is a very pleasant 58 y.o. female in no acute distress. She appears well-developed and well-nourished and well groomed. She is obese.   HEENT: Normocephalic, atraumatic, pupils are equal, round and reactive to light and accommodation. Funduscopic exam is normal with sharp disc margins noted. Extraocular tracking is good without limitation to gaze excursion or nystagmus noted. Normal smooth pursuit is noted. Hearing is grossly intact. Tympanic membranes are clear bilaterally. Face is symmetric with normal facial animation and normal facial sensation. Speech is clear with no dysarthria  noted. There is no hypophonia. There is no lip, neck/head, jaw or voice tremor. Neck is supple with full range of passive and active motion. There are no carotid bruits on auscultation. Oropharynx exam reveals: mild mouth dryness, adequate dental hygiene and moderate airway crowding, due to large tongue, elongated uvula and tonsils in place. Mallampati is class II. Tongue protrudes centrally and palate elevates symmetrically. Tonsils are 1+. Neck size is 15 inches.   Chest: Clear to auscultation without wheezing, rhonchi or crackles noted.  Heart: S1+S2+0, regular and normal without murmurs, rubs or gallops noted.   Abdomen: Soft, non-tender and non-distended with normal bowel sounds appreciated on auscultation.  Extremities: There is trace pitting edema in the distal lower extremities bilaterally. Pedal pulses are intact.  Skin: Warm and dry without trophic changes noted. There are no varicose veins.  Musculoskeletal: exam reveals no obvious joint deformities, tenderness or joint swelling or erythema.   Neurologically:  Mental status: The patient is awake, alert and oriented in all 4 spheres. Her memory, attention, language and knowledge are appropriate. There is no aphasia, agnosia, apraxia or anomia. Speech is clear with normal prosody and enunciation. Thought process is linear. Mood is congruent and affect is constricted.  Cranial nerves are as described above under HEENT exam. In addition, shoulder shrug is normal with equal shoulder height noted. Motor exam: Normal bulk, strength and tone is noted. There is no drift, tremor or rebound. Romberg is negative. Reflexes are 2+ throughout. Toes are downgoing bilaterally. Fine motor skills are intact with normal finger taps, normal hand movements, normal rapid alternating patting, normal foot taps and normal foot agility.  Cerebellar testing shows no dysmetria or intention tremor on finger to nose testing. Heel to shin is unremarkable bilaterally.  There is no truncal or gait ataxia.  Sensory exam is intact to light touch, pinprick, vibration, temperature sense in the upper and lower extremities.  Gait, station and balance are unremarkable. No veering to one side is noted. No leaning to one side is noted. Posture is age-appropriate and stance is narrow based. No problems turning are noted. She turns en bloc. Tandem walk is unremarkable. Intact toe and heel stance is noted.  Assessment and Plan:  In summary, Sara [name] is a very pleasant 58 y.o.-year old female with a history, prior Dx and physical exam consistent or obstructive sleep apnea (OSA). I had a long chat with the patient and her husband about my findings and the diagnosis, its prognosis and treatment options. We talked about medical treatments and non-pharmacological approaches. I explained in particular the risks and ramifications of untreated moderate to severe OSA, especially with respect to developing cardiovascular disease down the Road, including congestive heart failure, difficult to treat hypertension, cardiac arrhythmias, or stroke. Even type 2 diabetes has in part been linked to untreated OSA. We talked about trying to maintain a healthy lifestyle in general, as well as the importance of weight control. I encouraged the patient to eat healthy, exercise daily and keep well hydrated, to keep a scheduled bedtime and wake time routine, to not skip any meals and eat healthy snacks in between meals. She is commended on her weight loss and she has been able to come off of her diabetes medicine as well. I recommended the following at this time: sleep study with potential positive airway pressure titration. While reluctant, she is willing to come back to get retested and also consider treatment for sleep apnea.  I explained the sleep test procedure to the patient and also outlined possible surgical and non-surgical treatment options of OSA, including the use of a  custom-made dental device, upper airway surgical options, such as pillar implants, radiofrequency surgery, tongue base surgery, and UPPP. I also explained the CPAP treatment option to the patient, who indicated that she would be willing to try CPAP if the need arises, even though she is reluctant. I explained the importance of being compliant with PAP treatment, not only for insurance purposes but primarily to improve Her symptoms, and for the patient's long term health benefit, including to reduce Her cardiovascular risks. I answered all their questions today and the patient and her husband were in agreement. I would like to see her back after the sleep study is completed and encouraged them to call with any interim questions, concerns, problems or updates.    Thank you very much for allowing me to participate in the care of this nice patient. If I can be of any further assistance to you please do not hesitate to call me.  Sincerely,   Star Age, MD, PhD

## 2013-11-20 ENCOUNTER — Ambulatory Visit (INDEPENDENT_AMBULATORY_CARE_PROVIDER_SITE_OTHER): Payer: BC Managed Care – PPO

## 2013-11-20 DIAGNOSIS — R569 Unspecified convulsions: Secondary | ICD-10-CM

## 2013-11-20 DIAGNOSIS — G4761 Periodic limb movement disorder: Secondary | ICD-10-CM

## 2013-11-20 DIAGNOSIS — G4733 Obstructive sleep apnea (adult) (pediatric): Secondary | ICD-10-CM

## 2013-11-20 DIAGNOSIS — G479 Sleep disorder, unspecified: Secondary | ICD-10-CM

## 2013-11-27 ENCOUNTER — Telehealth: Payer: Self-pay | Admitting: Neurology

## 2013-11-27 DIAGNOSIS — G4733 Obstructive sleep apnea (adult) (pediatric): Secondary | ICD-10-CM

## 2013-11-27 NOTE — Telephone Encounter (Signed)
Please call and notify patient that the recent sleep study confirmed the diagnosis of OSA, which is severe in her case. She did well with CPAP during the study with significant improvement of the respiratory events. Therefore, I would like start the patient on CPAP at home. I placed the order in the chart.   Arrange for CPAP set up at home through a DME company of patient's choice and fax/route report to PCP and referring MD (if other than PCP).   The patient will also need a follow up appointment with me in 6-8 weeks post set up that has to be scheduled; help the patient schedule this (in a follow-up slot).   Please re-enforce the importance of compliance with treatment and the need for [place] to monitor compliance data.   Once you have spoken to the patient and scheduled the return appointment, you may close this encounter, thanks,   Star Age, MD, PhD [facility] ([place])

## 2013-12-02 ENCOUNTER — Encounter: Payer: Self-pay | Admitting: *Deleted

## 2013-12-02 ENCOUNTER — Telehealth: Payer: Self-pay | Admitting: Neurology

## 2013-12-02 NOTE — Telephone Encounter (Signed)
I called patient. The patient last had a seizure-type event on 10/25/2013. Recommend she not drive for 6 months following this event. The patient has severe sleep apnea which could  be causing seizures during sleep. We need to make sure that the sleep apnea is adequately controlled to the patient has not had a seizure or fall sleep while driving.

## 2013-12-02 NOTE — Telephone Encounter (Signed)
I called and spoke with the patient about her recent sleep study results. I informed the patient that the study confirmed the diagnosis of obstructive sleep apnea which in her case is severe. I informed the patient that Dr. Rexene Alberts recommends CPAP therapy at home and I will send her order to [facility] and they'll contact her once they contact [facility] for benefits. I will send a copy of the report to Dr. Jannifer Franklin and Dr. [name] office.

## 2013-12-02 NOTE — Telephone Encounter (Signed)
Patient called after receiving her sleep study results, wanting to know if its okay for her to start driving again.

## 2013-12-03 ENCOUNTER — Telehealth: Payer: Self-pay | Admitting: Neurology

## 2013-12-03 NOTE — Telephone Encounter (Signed)
Patient is requesting a call back from Dr. Jannifer Franklin concerning the message that he left. Patient has more questions. Please advise.

## 2013-12-03 NOTE — Telephone Encounter (Signed)
Patient requesting a call back from Dr. Jannifer Franklin as she has some questions from the message he left her. Please have Dr. Jannifer Franklin call.

## 2013-12-03 NOTE — Telephone Encounter (Signed)
I called the patient again, left a message again. I called the home number, talked with a family member. I once again left a message regarding the driving, would not recommend driving for 6 months.

## 2013-12-06 ENCOUNTER — Telehealth: Payer: Self-pay | Admitting: Neurology

## 2013-12-06 NOTE — Telephone Encounter (Signed)
Called patient and patient stated that she would to make an appt to come in to see Dr. Jannifer Franklin. I explained to the patient that the doctor did not have anything open right now and if she would like to come in and sit down with Jeani Hawking, NP to discuss her results. Patient states that she has questions concerning the finding of her results. I made an appt for the patient with Jeani Hawking, NP on 12/12/13.  FYI

## 2013-12-06 NOTE — Telephone Encounter (Signed)
Patient calling to state that she would like to set up an appointment with Dr. Jannifer Franklin soon to discuss her diagnosis and his findings. Please call patient at the number listed, if she doesn't answer that, please try home number.

## 2013-12-06 NOTE — Telephone Encounter (Signed)
I called the patient. The patient indicates that she was on a medication that was dropping her blood sugar night. The patient been off this medication, and she has done well over the last 6 weeks. If the patient does note a 6 weeks without having her episodes, she can return to driving.

## 2013-12-12 ENCOUNTER — Ambulatory Visit: Payer: Self-pay | Admitting: Nurse Practitioner

## 2013-12-26 ENCOUNTER — Other Ambulatory Visit (HOSPITAL_BASED_OUTPATIENT_CLINIC_OR_DEPARTMENT_OTHER): Payer: BC Managed Care – PPO

## 2013-12-26 ENCOUNTER — Encounter (INDEPENDENT_AMBULATORY_CARE_PROVIDER_SITE_OTHER): Payer: Self-pay

## 2013-12-26 DIAGNOSIS — D473 Essential (hemorrhagic) thrombocythemia: Secondary | ICD-10-CM

## 2013-12-26 LAB — CBC WITH DIFFERENTIAL/PLATELET
BASO%: 1.5 % (ref 0.0–2.0)
BASOS ABS: 0.1 10*3/uL (ref 0.0–0.1)
EOS ABS: 0.2 10*3/uL (ref 0.0–0.5)
EOS%: 3.8 % (ref 0.0–7.0)
HCT: 36.9 % (ref 34.8–46.6)
HEMOGLOBIN: 12.6 g/dL (ref 11.6–15.9)
LYMPH#: 1.3 10*3/uL (ref 0.9–3.3)
LYMPH%: 32.3 % (ref 14.0–49.7)
MCH: 37.4 pg — ABNORMAL HIGH (ref 25.1–34.0)
MCHC: 34.1 g/dL (ref 31.5–36.0)
MCV: 109.5 fL — AB (ref 79.5–101.0)
MONO#: 0.4 10*3/uL (ref 0.1–0.9)
MONO%: 8.9 % (ref 0.0–14.0)
NEUT#: 2.1 10*3/uL (ref 1.5–6.5)
NEUT%: 53.5 % (ref 38.4–76.8)
Platelets: 351 10*3/uL (ref 145–400)
RBC: 3.37 10*6/uL — ABNORMAL LOW (ref 3.70–5.45)
RDW: 12.5 % (ref 11.2–14.5)
WBC: 3.9 10*3/uL (ref 3.9–10.3)
nRBC: 0 % (ref 0–0)

## 2013-12-30 ENCOUNTER — Ambulatory Visit (INDEPENDENT_AMBULATORY_CARE_PROVIDER_SITE_OTHER): Payer: BC Managed Care – PPO | Admitting: Cardiovascular Disease

## 2013-12-30 ENCOUNTER — Encounter: Payer: Self-pay | Admitting: Cardiovascular Disease

## 2013-12-30 VITALS — BP 134/90 | HR 78 | Resp 16 | Ht 62.5 in | Wt 221.8 lb

## 2013-12-30 DIAGNOSIS — I951 Orthostatic hypotension: Secondary | ICD-10-CM

## 2013-12-30 DIAGNOSIS — IMO0002 Reserved for concepts with insufficient information to code with codable children: Secondary | ICD-10-CM

## 2013-12-30 DIAGNOSIS — R55 Syncope and collapse: Secondary | ICD-10-CM

## 2013-12-30 DIAGNOSIS — I10 Essential (primary) hypertension: Secondary | ICD-10-CM

## 2013-12-30 DIAGNOSIS — Z9889 Other specified postprocedural states: Secondary | ICD-10-CM

## 2013-12-30 DIAGNOSIS — E785 Hyperlipidemia, unspecified: Secondary | ICD-10-CM

## 2013-12-30 NOTE — Patient Instructions (Addendum)
STOP the Clopidogrel ( PLAVIX ).  Your physician recommends that you schedule a follow-up appointment in: ONE YEAR.

## 2014-01-04 ENCOUNTER — Encounter: Payer: Self-pay | Admitting: Cardiovascular Disease

## 2014-01-04 DIAGNOSIS — E7800 Pure hypercholesterolemia, unspecified: Secondary | ICD-10-CM | POA: Insufficient documentation

## 2014-01-04 NOTE — Progress Notes (Signed)
Patient ID: Sara [name], female   DOB: 1956-05-03, 58 y.o.   MRN: [id number]     Reason for office visit Renovascular hypertension, syncope  Sara [name] is a morbidly obese 58 year old woman who is had episodes of syncope over the last couple of years. Most recently she had 2 separate events that occurred in November (when she was hospitalized and in January. No etiology has been identified. The patient suspects it might be sleep apnea and she has been wearing CPAP since February. Her antidiabetic agents have recently stopped due to the suspicion that she might have had hypoglycemic spells - most recent hemoglobin A1c was only 5.7%. She is also being evaluated for possible seizures and according to her report Dr. Floyde Parkins has reviewed both her EEG and MRI of the head and found these to be normal. One of these events happened when she was asleep. She made an unusual noise in her sleep and her husband was unable to wake her up. When she did wake up the paramedics were already there. This led to the suspicion of possible syncope.  She has a history of systemic hypertension that became easier to manage after she received a right renal artery stent in 2012.  A 30 day event monitor did not show any evidence of meaningful arrhythmia. She had an essentially normal echocardiogram on September with the exception of mild LVH and mildly abnormal relaxation. She had a normal nuclear stress test in 2001  Significant comorbidities include obesity, DM (now controlled with diet only), essential thrombocythemia and borderline renal dysfunction.   Allergies  Allergen Reactions  . Sulfa Antibiotics Itching    Current Outpatient Prescriptions  Medication Sig Dispense Refill  . aspirin 81 MG tablet Take 81 mg by mouth daily.      Marland Kitchen atorvastatin (LIPITOR) 10 MG tablet Take 10 mg by mouth at bedtime.       . cholecalciferol (VITAMIN D) 1000 UNITS tablet Take 2,000 Units by mouth 2 (two) times daily.       .  hydroxyurea (HYDREA) 500 MG capsule Take 500 mg by mouth 2 (two) times daily. May take with food to minimize GI side effects.      . nebivolol (BYSTOLIC) 5 MG tablet Take 5 mg by mouth daily.      . STUDY MEDICATION Take 100 mg by mouth daily. Research study with Dr. Baird Cancer - pt may be receiving placebo or allopurinol Protocol [id number] 1 capsule [id number] 1 capsule      . valsartan (DIOVAN) 80 MG tablet Take 80 mg by mouth at bedtime.      . vitamin C (ASCORBIC ACID) 500 MG tablet Take 500 mg by mouth daily.       No current facility-administered medications for this visit.    Past Medical History  Diagnosis Date  . Thrombocytosis   . Hypertension   . Diabetes mellitus   . Gout   . Renal artery stenosis 12/13/2010    PTCA and Stent - right  . Syncopal episodes   . PAD (peripheral artery disease)   . Hyperlipemia   . Morbid obesity   . LVH (left ventricular hypertrophy)     12/21/09 Echo -mild asymmetric LVH,EF =>55%  . OSA (obstructive sleep apnea)     Past Surgical History  Procedure Laterality Date  . Breast reduction surgery  1995    S/P Bilateral breast reduction  . Cesarean section  1980, 1986    Times  Two.  . Breast  cyst excision      S/P Benign Right Breast Cyst Removal.  . Partial hysterectomy  1994  . Extremity cyst excision  1993    left wrist  . Renal artery stent placement Right     Family History  Problem Relation Age of Onset  . Breast cancer Maternal Aunt     2 maternal aunts had breast cancer.  Marland Kitchen Heart failure Mother   . Diabetes Father   . Diabetes Sister   . Hypertension Sister   . Breast cancer Maternal Grandmother   . Hypertension Sister   . Hypertension Sister     History   Social History  . Marital Status: Married    Spouse Name: N/A    Number of Children: 2  . Years of Education: COLLEGE   Occupational History  . [employer].    Social History Main Topics  . Smoking status: Never Smoker   . Smokeless tobacco:  Never Used  . Alcohol Use: No  . Drug Use: No  . Sexual Activity: Not on file   Other Topics Concern  . Not on file   Social History Narrative  . No narrative on file    Review of systems: The patient specifically denies any chest pain at rest or with exertion, dyspnea at rest or with exertion, orthopnea, paroxysmal nocturnal dyspnea, palpitations, focal neurological deficits, intermittent claudication, lower extremity edema, unexplained weight gain, cough, hemoptysis or wheezing.  The patient also denies abdominal pain, nausea, vomiting, dysphagia, diarrhea, constipation, polyuria, polydipsia, dysuria, hematuria, frequency, urgency, abnormal bleeding or bruising, fever, chills, unexpected weight changes, mood swings, change in skin or hair texture, change in voice quality, auditory or visual problems, allergic reactions or rashes, new musculoskeletal complaints other than usual "aches and pains".    PHYSICAL EXAM BP 134/90  Pulse 78  Resp 16  Ht 5' 2.5" (1.588 m)  Wt 100.608 kg (221 lb 12.8 oz)  BMI 39.90 kg/m2  General: Alert, oriented x3, no distress Head: no evidence of trauma, PERRL, EOMI, no exophtalmos or lid lag, no myxedema, no xanthelasma; normal ears, nose and oropharynx Neck: normal jugular venous pulsations and no hepatojugular reflux; brisk carotid pulses without delay and no carotid bruits Chest: clear to auscultation, no signs of consolidation by percussion or palpation, normal fremitus, symmetrical and full respiratory excursions Cardiovascular: normal position and quality of the apical impulse, regular rhythm, normal first and second heart sounds, no murmurs, rubs or gallops Abdomen: no tenderness or distention, no masses by palpation, no abnormal pulsatility or arterial bruits, normal bowel sounds, no hepatosplenomegaly Extremities: no clubbing, cyanosis or edema; 2+ radial, ulnar and brachial pulses bilaterally; 2+ right femoral, posterior tibial and dorsalis  pedis pulses; 2+ left femoral, posterior tibial and dorsalis pedis pulses; no subclavian or femoral bruits Neurological: grossly nonfocal   EKG: Normal sinus rhythm  Lipid Panel     Component Value Date/Time   CHOL 100 07/21/2013 0220   TRIG 52 07/21/2013 0220   HDL 44 07/21/2013 0220   CHOLHDL 2.3 07/21/2013 0220   VLDL 10 07/21/2013 0220   LDLCALC 46 07/21/2013 0220    BMET    Component Value Date/Time   NA 145 10/31/2013 1301   NA 140 08/24/2013 2346   K 3.7 10/31/2013 1301   K 3.3* 08/24/2013 2346   CL 102 08/24/2013 2346   CL 102 03/19/2013 1535   CO2 29 10/31/2013 1301   CO2 25 08/24/2013 2346   GLUCOSE 95 10/31/2013 1301   GLUCOSE  108* 08/24/2013 2346   GLUCOSE 117* 03/19/2013 1535   BUN 12.4 10/31/2013 1301   BUN 16 08/24/2013 2346   CREATININE 1.1 10/31/2013 1301   CREATININE 1.12* 08/24/2013 2346   CALCIUM 9.5 10/31/2013 1301   CALCIUM 9.2 08/24/2013 2346   GFRNONAA 53* 08/24/2013 2346   GFRAA 61* 08/24/2013 2346     ASSESSMENT AND PLAN Syncope It remains unclear whether she truly had syncope or seizures. She does not have any features to suggest vasovagal events. When I evaluated her in March she had had an episode of syncope that occurred while sitting in her chair at work and she was found by her employer on the floor. There were never any prodromal symptoms. That is when we performed the event monitor which was unrevealing and we decided to readjust her antihypertensive medications. I think we should strongly consider implantation of a loop recorder since the etiology of her loss of consciousness remains essentially unexplained.  History of renal stent Symptoms do not sound compatible with stroke or TIA. Her renal stent is now 60 years old. Will discontinue Plavix.  Hyperlipidemia When last checked her total cholesterol was 100 and her LDL cholesterol was < 50.   Orders Placed This Encounter  Procedures  . EKG 12-Lead    Patient Instructions  STOP the Clopidogrel ( PLAVIX  ).  Your physician recommends that you schedule a follow-up appointment in: ONE YEAR.     Holli Humbles, MD, [place] [phone] office [phone] pager

## 2014-01-04 NOTE — Assessment & Plan Note (Signed)
When last checked her total cholesterol was 100 and her LDL cholesterol was < 50.

## 2014-01-04 NOTE — Assessment & Plan Note (Signed)
It remains unclear whether she truly had syncope or seizures. She does not have any features to suggest vasovagal events. When I evaluated her in March she had had an episode of syncope that occurred while sitting in her chair at work and she was found by her employer on the floor. There were never any prodromal symptoms. That is when we performed the event monitor which was unrevealing and we decided to readjust her antihypertensive medications. I think we should strongly consider implantation of a loop recorder since the etiology of her loss of consciousness remains essentially unexplained.

## 2014-01-04 NOTE — Assessment & Plan Note (Addendum)
Symptoms do not sound compatible with stroke or TIA. Her renal stent is now 60 years old. Will discontinue Plavix.

## 2014-01-05 ENCOUNTER — Encounter (HOSPITAL_COMMUNITY): Payer: Self-pay | Admitting: Emergency Medicine

## 2014-01-05 ENCOUNTER — Emergency Department (HOSPITAL_COMMUNITY): Payer: BC Managed Care – PPO

## 2014-01-05 ENCOUNTER — Emergency Department (HOSPITAL_COMMUNITY)
Admission: EM | Admit: 2014-01-05 | Discharge: 2014-01-05 | Disposition: A | Discharge status: Home / Self Care | Payer: BC Managed Care – PPO | Attending: Emergency Medicine | Admitting: Emergency Medicine

## 2014-01-05 DIAGNOSIS — Z7982 Long term (current) use of aspirin: Secondary | ICD-10-CM | POA: Insufficient documentation

## 2014-01-05 DIAGNOSIS — G4733 Obstructive sleep apnea (adult) (pediatric): Secondary | ICD-10-CM | POA: Insufficient documentation

## 2014-01-05 DIAGNOSIS — R42 Dizziness and giddiness: Secondary | ICD-10-CM | POA: Insufficient documentation

## 2014-01-05 DIAGNOSIS — Z880 Allergy status to penicillin: Secondary | ICD-10-CM | POA: Insufficient documentation

## 2014-01-05 DIAGNOSIS — Z79899 Other long term (current) drug therapy: Secondary | ICD-10-CM | POA: Insufficient documentation

## 2014-01-05 DIAGNOSIS — E119 Type 2 diabetes mellitus without complications: Secondary | ICD-10-CM | POA: Insufficient documentation

## 2014-01-05 DIAGNOSIS — I10 Essential (primary) hypertension: Secondary | ICD-10-CM | POA: Insufficient documentation

## 2014-01-05 DIAGNOSIS — Z8669 Personal history of other diseases of the nervous system and sense organs: Secondary | ICD-10-CM | POA: Insufficient documentation

## 2014-01-05 LAB — URINALYSIS, ROUTINE W REFLEX MICROSCOPIC
Bilirubin Urine: NEGATIVE
Glucose, UA: NEGATIVE mg/dL
Ketones, ur: NEGATIVE mg/dL
Leukocytes, UA: NEGATIVE
NITRITE: NEGATIVE
PH: 6.5 (ref 5.0–8.0)
Protein, ur: NEGATIVE mg/dL
SPECIFIC GRAVITY, URINE: 1.019 (ref 1.005–1.030)
Urobilinogen, UA: 1 mg/dL (ref 0.0–1.0)

## 2014-01-05 LAB — CBC WITH DIFFERENTIAL/PLATELET
Basophils Absolute: 0 10*3/uL (ref 0.0–0.1)
Basophils Relative: 1 % (ref 0–1)
EOS ABS: 0.2 10*3/uL (ref 0.0–0.7)
EOS PCT: 3 % (ref 0–5)
HEMATOCRIT: 37.7 % (ref 36.0–46.0)
Hemoglobin: 13.2 g/dL (ref 12.0–15.0)
LYMPHS ABS: 1.8 10*3/uL (ref 0.7–4.0)
Lymphocytes Relative: 33 % (ref 12–46)
MCH: 38.2 pg — AB (ref 26.0–34.0)
MCHC: 35 g/dL (ref 30.0–36.0)
MCV: 109 fL — AB (ref 78.0–100.0)
MONO ABS: 0.5 10*3/uL (ref 0.1–1.0)
MONOS PCT: 9 % (ref 3–12)
Neutro Abs: 3 10*3/uL (ref 1.7–7.7)
Neutrophils Relative %: 55 % (ref 43–77)
PLATELETS: 340 10*3/uL (ref 150–400)
RBC: 3.46 MIL/uL — AB (ref 3.87–5.11)
RDW: 12.1 % (ref 11.5–15.5)
WBC: 5.4 10*3/uL (ref 4.0–10.5)

## 2014-01-05 LAB — COMPREHENSIVE METABOLIC PANEL
ALT: 18 U/L (ref 0–35)
AST: 21 U/L (ref 0–37)
Albumin: 3.5 g/dL (ref 3.5–5.2)
Alkaline Phosphatase: 82 U/L (ref 39–117)
BUN: 20 mg/dL (ref 6–23)
CALCIUM: 9 mg/dL (ref 8.4–10.5)
CO2: 25 meq/L (ref 19–32)
CREATININE: 1.18 mg/dL — AB (ref 0.50–1.10)
Chloride: 104 mEq/L (ref 96–112)
GFR calc Af Amer: 57 mL/min — ABNORMAL LOW (ref >90)
GFR calc non Af Amer: 49 mL/min — ABNORMAL LOW (ref >90)
Glucose, Bld: 111 mg/dL — ABNORMAL HIGH (ref 70–99)
Potassium: 4 mEq/L (ref 3.7–5.3)
Sodium: 143 mEq/L (ref 137–147)
TOTAL PROTEIN: 7 g/dL (ref 6.0–8.3)
Total Bilirubin: 0.2 mg/dL — ABNORMAL LOW (ref 0.3–1.2)

## 2014-01-05 LAB — TROPONIN I: Troponin I: <0.3 ng/mL (ref <0.30)

## 2014-01-05 LAB — URINE MICROSCOPIC-ADD ON

## 2014-01-05 NOTE — ED Notes (Signed)
Pt taken to xray 

## 2014-01-05 NOTE — ED Notes (Signed)
Pt states that she passed has passed out twice in the past 7 months related to dehydration and was sent to Dr Jannifer Franklin with neurology. Dr Dierdre Searles has also seen pt and was taken off of plavix last week. Dr Bryon Lions PCP took pt off of Janumet on January 9th.

## 2014-01-05 NOTE — ED Provider Notes (Signed)
CSN: [id number]     Arrival date & time 01/05/14  1922 History   First MD Initiated Contact with Patient 01/05/14 1954     Chief Complaint  Patient presents with  . Dizziness     (Consider location/radiation/quality/duration/timing/severity/associated sxs/prior Treatment) Patient is a 58 y.o. female presenting with dizziness.  Dizziness  Pt with history as below has had several months of intermittent syncope vs seizures. She has had extensive evaluation including EEG x 2 and MRI which have been reported normal. In January her PCP noted that her HgbA1c was 5.7% and stopped her oral diabetes meds thinking it could be episodes of hypoglycemia. She was also diagnosed with sleep apnea and started on CPAP about 2 weeks ago which has helped considerably. This afternoon while cleaning and watching TV she states she 'didn't feel right'. She did not feel lightheaded or dizzy like she was going to pass out and no room spinning. She states she 'felt like she had trouble with memory'. She did not lose consciousness. Husband states he thought she might have low blood sugar so he gave her some syrup. She does not remember that but a short time later she remembers talking to him and asking him what day it was. She remembers driving here and is back to baseline now, no longer having any symptoms.   Past Medical History  Diagnosis Date  . Thrombocytosis   . Hypertension   . Diabetes mellitus   . Gout   . Renal artery stenosis 12/13/2010    PTCA and Stent - right  . Syncopal episodes   . PAD (peripheral artery disease)   . Hyperlipemia   . Morbid obesity   . LVH (left ventricular hypertrophy)     12/21/09 Echo -mild asymmetric LVH,EF =>55%  . OSA (obstructive sleep apnea)    Past Surgical History  Procedure Laterality Date  . Breast reduction surgery  1995    S/P Bilateral breast reduction  . Cesarean section  1980, 1986    Times  Two.  . Breast cyst excision      S/P Benign Right Breast Cyst  Removal.  . Partial hysterectomy  1994  . Extremity cyst excision  1993    left wrist  . Renal artery stent placement Right    Family History  Problem Relation Age of Onset  . Breast cancer Maternal Aunt     2 maternal aunts had breast cancer.  Marland Kitchen Heart failure Mother   . Diabetes Father   . Diabetes Sister   . Hypertension Sister   . Breast cancer Maternal Grandmother   . Hypertension Sister   . Hypertension Sister    History  Substance Use Topics  . Smoking status: Never Smoker   . Smokeless tobacco: Never Used  . Alcohol Use: No   OB History   Grav Para Term Preterm Abortions TAB SAB Ect Mult Living                 Review of Systems  Neurological: Positive for dizziness.   All other systems reviewed and are negative except as noted in HPI.    Allergies  Sulfa antibiotics  Home Medications   Current Outpatient Rx  Name  Route  Sig  Dispense  Refill  . aspirin 81 MG tablet   Oral   Take 81 mg by mouth daily.         Marland Kitchen atorvastatin (LIPITOR) 10 MG tablet   Oral   Take 10 mg by mouth at  bedtime.          . cholecalciferol (VITAMIN D) 1000 UNITS tablet   Oral   Take 2,000 Units by mouth 2 (two) times daily.          . hydroxyurea (HYDREA) 500 MG capsule   Oral   Take 500 mg by mouth 2 (two) times daily. May take with food to minimize GI side effects.         . nebivolol (BYSTOLIC) 5 MG tablet   Oral   Take 5 mg by mouth daily.         . STUDY MEDICATION   Oral   Take 100 mg by mouth daily. Research study with Dr. Baird Cancer - pt may be receiving placebo or allopurinol Protocol [id number] 1 capsule [id number] 1 capsule         . valsartan (DIOVAN) 80 MG tablet   Oral   Take 80 mg by mouth at bedtime.         . vitamin C (ASCORBIC ACID) 500 MG tablet   Oral   Take 500 mg by mouth daily.          BP 160/80  Pulse 81  Temp(Src) 99 F (37.2 C)  Resp 20  Ht 5\' 2"  (1.575 m)  Wt 226 lb (102.513 kg)  BMI 41.33 kg/m2  SpO2  94% Physical Exam  Nursing note and vitals reviewed. Constitutional: She is oriented to person, place, and time. She appears well-developed and well-nourished.  HENT:  Head: Normocephalic and atraumatic.  Eyes: EOM are normal. Pupils are equal, round, and reactive to light.  Neck: Normal range of motion. Neck supple.  Cardiovascular: Normal rate, normal heart sounds and intact distal pulses.   Pulmonary/Chest: Effort normal and breath sounds normal.  Abdominal: Bowel sounds are normal. She exhibits no distension. There is no tenderness.  Musculoskeletal: Normal range of motion. She exhibits no edema and no tenderness.  Neurological: She is alert and oriented to person, place, and time. She has normal strength. No cranial nerve deficit or sensory deficit.  Skin: Skin is warm and dry. No rash noted.  Psychiatric: She has a normal mood and affect.    ED Course  Procedures (including critical care time) Labs Review Labs Reviewed  CBC WITH DIFFERENTIAL - Abnormal; Notable for the following:    RBC 3.46 (*)    MCV 109.0 (*)    MCH 38.2 (*)    All other components within normal limits  COMPREHENSIVE METABOLIC PANEL - Abnormal; Notable for the following:    Glucose, Bld 111 (*)    Creatinine, Ser 1.18 (*)    Total Bilirubin 0.2 (*)    GFR calc non Af Amer 49 (*)    GFR calc Af Amer 57 (*)    All other components within normal limits  URINALYSIS, ROUTINE W REFLEX MICROSCOPIC - Abnormal; Notable for the following:    APPearance CLOUDY (*)    Hgb urine dipstick SMALL (*)    All other components within normal limits  URINE MICROSCOPIC-ADD ON - Abnormal; Notable for the following:    Squamous Epithelial / LPF MANY (*)    Bacteria, UA MANY (*)    All other components within normal limits  TROPONIN I   Imaging Review Dg Chest 2 View  01/05/2014   CLINICAL DATA:  Dizzy.  EXAM: CHEST  2 VIEW  COMPARISON:  DG CHEST 2 VIEW dated 07/20/2013  FINDINGS: Mediastinum and hilar structures normal.  Subsegmental atelectasis left lung base.  No pleural effusion or pneumothorax. Heart size normal. Degenerative changes thoracic spine  IMPRESSION: Mild subsegmental atelectasis left lung base, otherwise negative chest.   Electronically Signed   By: Marcello Moores  Register   On: 01/05/2014 21:30     EKG Interpretation   Date/Time:  Sunday January 05 2014 19:45:42 EDT Ventricular Rate:  76 PR Interval:  193 QRS Duration: 86 QT Interval:  395 QTC Calculation: 444 R Axis:   38 Text Interpretation:  Age not entered, assumed to be  58 years old for  purpose of ECG interpretation Sinus rhythm Low voltage, precordial leads  Baseline wander in lead(s) II III aVR aVL aVF V1 V5 No significant change  since last tracing Confirmed by [facility]  MD, Juanda Crumble [phone]) on 01/05/2014  8:19:28 PM      MDM   Final diagnoses:  Dizziness    Pt with neg workup in the ED. Has had extensive inpatient and outpatient eval in the past for same. She is asymptomatic now, able to ambulate without symptoms in the ED and ready to go home. Advised PCP followup.     Charles B. Karle Starch, MD 01/05/14 2204

## 2014-01-05 NOTE — ED Notes (Signed)
The [pt has been feeling lightheaded this afternoon.  She has been taken off diabetic med last week diabetic oral meds

## 2014-01-05 NOTE — ED Notes (Signed)
Pt. Ambulated in hall with no problems 

## 2014-01-05 NOTE — Discharge Instructions (Signed)

## 2014-01-16 ENCOUNTER — Encounter: Payer: Self-pay | Admitting: Neurology

## 2014-01-17 NOTE — Progress Notes (Signed)
Quick Note:  I reviewed the patient's CPAP compliance data from 12/13/2013 to 01/11/2014, which is a total of 30 days, during which time the patient used CPAP every day. The average usage for all days was 7 hours and 14 minutes. The percent used days greater than 4 hours was 100 %, indicating superb compliance. The residual AHI was 2 per hour, indicating an appropriate treatment pressure of 9 cwp with EPR of 2. I will review this data with the patient at the next office visit, provide feedback and additional troubleshooting if need be. She is currently scheduled with our nurse practitioner, Ms. Lam, on 01/30/2014 at 11:30 AM.  Star Age, MD, PhD [facility] ([place])   ______

## 2014-01-21 ENCOUNTER — Encounter: Payer: Self-pay | Admitting: Neurology

## 2014-01-27 ENCOUNTER — Ambulatory Visit: Payer: BC Managed Care – PPO | Admitting: Nurse Practitioner

## 2014-01-30 ENCOUNTER — Encounter: Payer: Self-pay | Admitting: Nurse Practitioner

## 2014-01-30 ENCOUNTER — Ambulatory Visit (INDEPENDENT_AMBULATORY_CARE_PROVIDER_SITE_OTHER): Payer: BC Managed Care – PPO | Admitting: Nurse Practitioner

## 2014-01-30 ENCOUNTER — Encounter: Payer: Self-pay | Admitting: Neurology

## 2014-01-30 ENCOUNTER — Encounter (INDEPENDENT_AMBULATORY_CARE_PROVIDER_SITE_OTHER): Payer: Self-pay

## 2014-01-30 VITALS — BP 156/78 | HR 68 | Ht 61.5 in | Wt 226.0 lb

## 2014-01-30 DIAGNOSIS — G4733 Obstructive sleep apnea (adult) (pediatric): Secondary | ICD-10-CM

## 2014-01-30 DIAGNOSIS — R569 Unspecified convulsions: Secondary | ICD-10-CM

## 2014-01-30 DIAGNOSIS — G4730 Sleep apnea, unspecified: Secondary | ICD-10-CM | POA: Insufficient documentation

## 2014-01-30 NOTE — Progress Notes (Signed)
PATIENT: Sara [name] DOB: 1956-07-23  REASON FOR VISIT: follow up for syncope vs. seizure HISTORY FROM: patient  HISTORY OF PRESENT ILLNESS: Sara [name] is a 58 year old right-handed black female with a history of sleep apnea, currently not on therapy. The patient could not tolerate the mask, and she has not been using the CPAP machine for over a year. The patient snores at night, and the husband has taken her to the emergency room on 2 occasions within the last several months. The patient had an event in November 2014 when she was noted to have a change in her breathing pattern. The patient did not have any observable stiffening or jerking, but the patient did bite her tongue. The patient was confused for 10 or 15 minutes following this event, and an evaluation in the emergency room revealed an unremarkable CT scan of brain. The patient was set up as an outpatient for an EEG study that was normal. The patient was not placed on seizure medications. The patient did not lose control of the bowels or the bladder. On 10/25/2013, the patient had a similar event, unassociated with tongue biting or bowel or bladder incontinence. The patient was confused, agitated and combative for about 15 minutes. The patient indicates that she has some recollection of events during this time. Once again, the patient was not noted to have any jerking or stiffening. The episode came out of sleep. The patient reports no focal numbness or weakness of the face, arms, or legs. The patient denies any balance issues. The patient denies any headaches or dizziness. The patient is sent to this office for further evaluation. The patient was given Keppra, but she is not taking the medication.  UPDATE 01/30/14 (LL):  Since last visit, patient had MRI brain and EEG which were normal.  Her antidiabetic agents were stopped due to the suspicion that she might have had hypoglycemic spells - most recent hemoglobin A1c was only 5.7%.  She  had a split-night sleep study with Dr. Rexene Alberts which showed severe sleep apnea.  She had been on cpap in the past but was not compliant.  Her AHI during the study was 73.9.  Periodic Limb Movement index was 15.7. Her optimal pressure was 9cm H2o CPAP.  Her download today shows a compliance of 100% with 30 days reporting.Average usage is 7 hours 14 monutes, AHI is 2.0. AI is 1.7 and HI is 0.3.  She states that she cannot tell any difference in the way she feels in the morning or during the day after being on cpap, so I worry about her compliance in the future.  Her ESS score is 2 and her FSS score is 4.  She is rather agitated today, upset that she has not been able to drive.  ROS:  Out of a complete 14 system review of symptoms, the patient complains only of the following symptoms, and all other reviewed systems are negative. No complaints.   ALLERGIES: Allergies  Allergen Reactions  . Sulfa Antibiotics Itching    HOME MEDICATIONS: Outpatient Prescriptions Prior to Visit  Medication Sig Dispense Refill  . aspirin 81 MG tablet Take 81 mg by mouth daily.      Marland Kitchen atorvastatin (LIPITOR) 10 MG tablet Take 10 mg by mouth every evening.       . Cholecalciferol (VITAMIN D3) 2000 UNITS capsule Take 2,000 Units by mouth daily.      . hydroxyurea (HYDREA) 500 MG capsule Take 500 mg by mouth 2 (two)  times daily. May take with food to minimize GI side effects.      . nebivolol (BYSTOLIC) 5 MG tablet Take 5 mg by mouth daily.      . STUDY MEDICATION Take 100 mg by mouth daily. Research study with Dr. Baird Cancer - pt may be receiving placebo or allopurinol Protocol [id number] 1 capsule [id number] 1 capsule      . valsartan (DIOVAN) 80 MG tablet Take 80 mg by mouth every evening.       . vitamin C (ASCORBIC ACID) 500 MG tablet Take 500 mg by mouth daily.       No facility-administered medications prior to visit.   PHYSICAL EXAM  Filed Vitals:   01/30/14 1122  BP: 156/78  Pulse: 68  Height: 5' 1.5"  (1.562 m)  Weight: 226 lb (102.513 kg)   Body mass index is 42.02 kg/(m^2).  Physical Exam  General: The patient is alert and cooperative at the time of the examination. The patient is moderately obese.  Eyes: Pupils are equal, round, and reactive to light. Neck: The neck is supple, no carotid bruits are noted.  Respiratory: The respiratory examination is clear.  Cardiovascular: The cardiovascular examination reveals a regular rate and rhythm, no obvious murmurs or rubs are noted.   Neurologic Exam  Mental status: The patient is alert and oriented x 3 at the time of the examination. The patient has apparent normal recent and remote memory, with an apparently normal attention span and concentration ability.  Cranial nerves: Facial symmetry is present. There is good sensation of the face to pinprick and soft touch bilaterally. The strength of the facial muscles and the muscles to head turning and shoulder shrug are normal bilaterally. Speech is well enunciated, no aphasia or dysarthria is noted. Extraocular movements are full. Visual fields are full. The tongue is midline, and the patient has symmetric elevation of the soft palate. No obvious hearing deficits are noted.  Motor: The motor testing reveals 5 over 5 strength of all 4 extremities. Good symmetric motor tone is noted throughout.  Sensory: Sensory testing is intact to soft touch on all 4 extremities. No evidence of extinction is noted.  Coordination: Cerebellar testing reveals good finger-nose-finger and heel-to-shin bilaterally.  Gait and station: Gait is normal. Tandem gait is normal. Romberg is negative. No drift is seen.  Reflexes: Deep tendon reflexes are symmetric and normal bilaterally.   ASSESSMENT AND PLAN 58 y.o. year old female  has a past medical history of Thrombocytosis; Hypertension; Diabetes mellitus; Gout; Renal artery stenosis (12/13/2010); Syncopal episodes; PAD (peripheral artery disease); Hyperlipemia; Morbid obesity;  LVH (left ventricular hypertrophy); and OSA (obstructive sleep apnea) here with:  1. Possible nocturnal seizures, 2 events.  No etiology found at this time. 2. Sleep apnea, now treated under Dr. Rexene Alberts with 100% compliance, reduction in AHI from 73.9 to 2.0 (though patient does not feel that it has made any difference).  The EEG study and the MRI were normal.  The patient if has been taken off of janumet. Her hemoglobin A1c was 5.7. Her physician was concerned that she may be having low sugar episodes.  Dr. Canary Brim is considering implantation of loop recorder.  She has not had any other episodes in the last 8 weeks.  She is cleared to go back to driving. She is to follow up with Dr. Rexene Alberts for her OSA on CPAP.  She is to contact our office if she has any recurrent episodes.   She is to call  our office if she has any further questions or concerns.  Philmore Pali, MSN, NP-C 01/30/2014, 11:35 AM [facility] [address] [place], [place] [address] [phone]  Note: This document was prepared with digital dictation and possible smart phrase technology. Any transcriptional errors that result from this process are unintentional.

## 2014-01-30 NOTE — Progress Notes (Signed)
I have read the note, and I agree with the clinical assessment and plan.  Sara [name]   

## 2014-01-30 NOTE — Patient Instructions (Signed)
Continue using your CPAP and follow up with Dr. Rexene Alberts as scheduled.  You may go back to driving, since your workup has been negative for seizure.  Please call our office if you have any recurrent episodes.    Please call our office if you have any further questions or concerns.

## 2014-02-27 ENCOUNTER — Telehealth: Payer: Self-pay | Admitting: Internal Medicine

## 2014-02-27 ENCOUNTER — Ambulatory Visit (HOSPITAL_BASED_OUTPATIENT_CLINIC_OR_DEPARTMENT_OTHER): Payer: BC Managed Care – PPO | Admitting: Internal Medicine

## 2014-02-27 ENCOUNTER — Other Ambulatory Visit (HOSPITAL_BASED_OUTPATIENT_CLINIC_OR_DEPARTMENT_OTHER): Payer: BC Managed Care – PPO

## 2014-02-27 VITALS — BP 139/79 | HR 81 | Temp 98.0°F | Resp 19 | Ht 61.5 in | Wt 223.2 lb

## 2014-02-27 DIAGNOSIS — D473 Essential (hemorrhagic) thrombocythemia: Secondary | ICD-10-CM

## 2014-02-27 LAB — CBC WITH DIFFERENTIAL/PLATELET
BASO%: 0.3 % (ref 0.0–2.0)
Basophils Absolute: 0 10*3/uL (ref 0.0–0.1)
EOS ABS: 0.1 10*3/uL (ref 0.0–0.5)
EOS%: 2.4 % (ref 0.0–7.0)
HCT: 38 % (ref 34.8–46.6)
HGB: 13 g/dL (ref 11.6–15.9)
LYMPH%: 21.8 % (ref 14.0–49.7)
MCH: 38 pg — ABNORMAL HIGH (ref 25.1–34.0)
MCHC: 34.2 g/dL (ref 31.5–36.0)
MCV: 111.1 fL — ABNORMAL HIGH (ref 79.5–101.0)
MONO#: 0.5 10*3/uL (ref 0.1–0.9)
MONO%: 9.1 % (ref 0.0–14.0)
NEUT%: 66.4 % (ref 38.4–76.8)
NEUTROS ABS: 3.3 10*3/uL (ref 1.5–6.5)
Platelets: 355 10*3/uL (ref 145–400)
RBC: 3.42 10*6/uL — AB (ref 3.70–5.45)
RDW: 11.7 % (ref 11.2–14.5)
WBC: 5 10*3/uL (ref 3.9–10.3)
lymph#: 1.1 10*3/uL (ref 0.9–3.3)

## 2014-02-27 LAB — COMPREHENSIVE METABOLIC PANEL (CC13)
ALT: 17 U/L (ref 0–55)
ANION GAP: 11 meq/L (ref 3–11)
AST: 19 U/L (ref 5–34)
Albumin: 3.7 g/dL (ref 3.5–5.0)
Alkaline Phosphatase: 89 U/L (ref 40–150)
BUN: 17.6 mg/dL (ref 7.0–26.0)
CALCIUM: 9.8 mg/dL (ref 8.4–10.4)
CHLORIDE: 107 meq/L (ref 98–109)
CO2: 26 meq/L (ref 22–29)
CREATININE: 1.2 mg/dL — AB (ref 0.6–1.1)
Glucose: 99 mg/dl (ref 70–140)
POTASSIUM: 4 meq/L (ref 3.5–5.1)
Sodium: 144 mEq/L (ref 136–145)
Total Bilirubin: 0.39 mg/dL (ref 0.20–1.20)
Total Protein: 7.2 g/dL (ref 6.4–8.3)

## 2014-02-27 NOTE — Progress Notes (Signed)
[facility] OFFICE PROGRESS NOTE  [name], MD [address] [place] [address]  DIAGNOSIS: Essential thrombocytosis - Plan: CBC with Differential, Basic metabolic panel (Bmet) - [facility], CBC with Differential  CC: Essential Thrombocytosis  CURRENT THERAPY: Hydroxyurea 1000 mg daily as of 03/19/2013.  Aspirin 81 mg daily.  INTERVAL HISTORY: Sara [name] 58 y.o. female with history of JAK positive Essential thrombocythemia presents for six month follow-up.  She was last seen by me on 10/31/2013.  She reports that she is doing very well.  She denies easy bruising.  She had colonoscopy by Dr. Ferdinand Lango in 2013 with a few polyps.  She will schedule a follow up with one of the affiliates.  She saw Dr. Baird Cancer last year for her annual exam.  She has stopped plavix based on recommendations from cardiology (stent in the kidney).  She was off diabetes medicine due to frequent "black outs" and her A1C is at 5.7.  She has also had her blood pressure medications reduced. She had her mammogram last year.   MEDICAL HISTORY: Past Medical History  Diagnosis Date  . Thrombocytosis   . Hypertension   . Diabetes mellitus   . Gout   . Renal artery stenosis 12/13/2010    PTCA and Stent - right  . Syncopal episodes   . PAD (peripheral artery disease)   . Hyperlipemia   . Morbid obesity   . LVH (left ventricular hypertrophy)     12/21/09 Echo -mild asymmetric LVH,EF =>55%  . OSA (obstructive sleep apnea)    INTERIM HISTORY: has Essential thrombocytosis; Hypertension; Type II or unspecified type diabetes mellitus without mention of complication, not stated as uncontrolled; Acute renal failure; History of renal stent; Orthostatic hypotension; Diastolic CHF; Syncope; OSA (obstructive sleep apnea); Hyperlipidemia; and Sleep apnea with use of continuous positive airway pressure (CPAP) on her problem list.    ALLERGIES:  is allergic to sulfa antibiotics.  MEDICATIONS: has a  current medication list which includes the following prescription(s): aspirin, atorvastatin, vitamin d3, hydroxyurea, nebivolol, STUDY MEDICATION, valsartan, and vitamin c.  SURGICAL HISTORY:  Past Surgical History  Procedure Laterality Date  . Breast reduction surgery  1995    S/P Bilateral breast reduction  . Cesarean section  1980, 1986    Times  Two.  . Breast cyst excision      S/P Benign Right Breast Cyst Removal.  . Partial hysterectomy  1994  . Extremity cyst excision  1993    left wrist  . Renal artery stent placement Right     REVIEW OF SYSTEMS:   Constitutional: Denies fevers, chills or abnormal weight loss Eyes: Denies blurriness of vision Ears, nose, mouth, throat, and face: Denies mucositis or sore throat Respiratory: Denies cough, dyspnea or wheezes Cardiovascular: Denies palpitation, chest discomfort or lower extremity swelling Gastrointestinal:  Denies nausea, heartburn ; she has constipation.  Skin: Denies abnormal skin rashes Lymphatics: Denies new lymphadenopathy or easy bruising Neurological:Denies numbness, tingling or new weaknesses Behavioral/Psych: Mood is stable, no new changes  All other systems were reviewed with the patient and are negative.  PHYSICAL EXAMINATION: ECOG PERFORMANCE STATUS: 0 - Asymptomatic  Blood pressure 139/79, pulse 81, temperature 98 F (36.7 C), temperature source Oral, resp. rate 19, height 5' 1.5" (1.562 m), weight 223 lb 3.2 oz (101.243 kg).  GENERAL:alert, no distress and comfortable; obese pleasant middle-age woman SKIN: skin color, texture, turgor are normal, no rashes or significant lesions EYES: normal, Conjunctiva are pink and non-injected, sclera  clear OROPHARYNX:no exudate, no erythema and lips, buccal mucosa, and tongue normal  NECK: supple, thyroid normal size, non-tender, without nodularity LYMPH:  no palpable lymphadenopathy in the cervical, axillary or inguinal LUNGS: clear to auscultation and percussion with  normal breathing effort HEART: regular rate & rhythm and no murmurs and no lower extremity edema ABDOMEN:abdomen soft,  Mildly tender in the epigastric and normal bowel sounds Musculoskeletal:no cyanosis of digits and no clubbing  NEURO: alert & oriented x 3 with fluent speech, no focal motor/sensory deficits   LABORATORY DATA: Results for orders placed in visit on 02/27/14 (from the past 48 hour(s))  COMPREHENSIVE METABOLIC PANEL (EL38)     Status: Abnormal   Collection Time    02/27/14  9:07 AM      Result Value Ref Range   Sodium 144  136 - 145 mEq/L   Potassium 4.0  3.5 - 5.1 mEq/L   Chloride 107  98 - 109 mEq/L   CO2 26  22 - 29 mEq/L   Glucose 99  70 - 140 mg/dl   BUN 17.6  7.0 - 26.0 mg/dL   Creatinine 1.2 (*) 0.6 - 1.1 mg/dL   Total Bilirubin 0.39  0.20 - 1.20 mg/dL   Alkaline Phosphatase 89  40 - 150 U/L   AST 19  5 - 34 U/L   ALT 17  0 - 55 U/L   Total Protein 7.2  6.4 - 8.3 g/dL   Albumin 3.7  3.5 - 5.0 g/dL   Calcium 9.8  8.4 - 10.4 mg/dL   Anion Gap 11  3 - 11 mEq/L  CBC WITH DIFFERENTIAL     Status: Abnormal   Collection Time    02/27/14  9:07 AM      Result Value Ref Range   WBC 5.0  3.9 - 10.3 10e3/uL   NEUT# 3.3  1.5 - 6.5 10e3/uL   HGB 13.0  11.6 - 15.9 g/dL   HCT 38.0  34.8 - 46.6 %   Platelets 355  145 - 400 10e3/uL   MCV 111.1 (*) 79.5 - 101.0 fL   MCH 38.0 (*) 25.1 - 34.0 pg   MCHC 34.2  31.5 - 36.0 g/dL   RBC 3.42 (*) 3.70 - 5.45 10e6/uL   RDW 11.7  11.2 - 14.5 %   lymph# 1.1  0.9 - 3.3 10e3/uL   MONO# 0.5  0.1 - 0.9 10e3/uL   Eosinophils Absolute 0.1  0.0 - 0.5 10e3/uL   Basophils Absolute 0.0  0.0 - 0.1 10e3/uL   NEUT% 66.4  38.4 - 76.8 %   LYMPH% 21.8  14.0 - 49.7 %   MONO% 9.1  0.0 - 14.0 %   EOS% 2.4  0.0 - 7.0 %   BASO% 0.3  0.0 - 2.0 %    CMP     Component Value Date/Time   NA 144 02/27/2014 0907   NA 143 01/05/2014 2005   K 4.0 02/27/2014 0907   K 4.0 01/05/2014 2005   CL 104 01/05/2014 2005   CL 102 03/19/2013 1535   CO2 26 02/27/2014  0907   CO2 25 01/05/2014 2005   GLUCOSE 99 02/27/2014 0907   GLUCOSE 111* 01/05/2014 2005   GLUCOSE 117* 03/19/2013 1535   BUN 17.6 02/27/2014 0907   BUN 20 01/05/2014 2005   CREATININE 1.2* 02/27/2014 0907   CREATININE 1.18* 01/05/2014 2005   CALCIUM 9.8 02/27/2014 0907   CALCIUM 9.0 01/05/2014 2005   PROT 7.2 02/27/2014 1017  PROT 7.0 01/05/2014 2005   ALBUMIN 3.7 02/27/2014 0907   ALBUMIN 3.5 01/05/2014 2005   AST 19 02/27/2014 0907   AST 21 01/05/2014 2005   ALT 17 02/27/2014 0907   ALT 18 01/05/2014 2005   ALKPHOS 89 02/27/2014 0907   ALKPHOS 82 01/05/2014 2005   BILITOT 0.39 02/27/2014 0907   BILITOT 0.2* 01/05/2014 2005   GFRNONAA 49* 01/05/2014 2005   GFRAA 57* 01/05/2014 2005    RADIOGRAPHIC STUDIES: No results found.  ASSESSMENT: Essential thrombocythemia. Diagnosed in 2010. JAK2 mutation was present. The patient underwent a bone marrow aspirate and biopsy on 01/20/2009 which yielded a specimen that was suboptimal for evaluation. However, megakaryocytes were abundant with normal  morphology. There was no clustering or excess blasts seen. Platelet count on 01/20/2009 was 881. The patient is being treated with aspirin and hydroxyurea.  PLAN:  1. Essential Thrombocythemia. --She should continue hydroxyurea dose to 1000 mg daily. We will continue to check CBC every 3 months given the stability of her plts and her plt trend. The patient had a mammogram on 1027/2014 which was negative for malignancy.  2. Follow-up.  We will have her return in about 6 months at which time we will check CBC and chemistries and check a CBC in 3 months.  --All questions were answered. The patient knows to call the clinic with any problems, questions or concerns. We can certainly see the patient much sooner if necessary.  --I spent 10 minutes counseling the patient face to face. The total time spent in the appointment was 15 minutes.    Concha Norway, MD 02/27/2014 10:36 AM

## 2014-02-27 NOTE — Telephone Encounter (Signed)
gv adn pritned appt sched and avs for opt for Aug and NOV

## 2014-03-09 ENCOUNTER — Encounter (HOSPITAL_COMMUNITY): Payer: Self-pay | Admitting: Emergency Medicine

## 2014-03-09 DIAGNOSIS — E119 Type 2 diabetes mellitus without complications: Secondary | ICD-10-CM | POA: Insufficient documentation

## 2014-03-09 DIAGNOSIS — I10 Essential (primary) hypertension: Secondary | ICD-10-CM | POA: Insufficient documentation

## 2014-03-09 DIAGNOSIS — F29 Unspecified psychosis not due to a substance or known physiological condition: Secondary | ICD-10-CM | POA: Insufficient documentation

## 2014-03-09 NOTE — ED Notes (Signed)
Pt and family reports that for appx 6 months she has noticed that after taking her medications she becomes confused and "not acting like myself" lasting for appx 30 minutes. Pt is now  AO x 4, ambulatory to triage, NAD. Neuro intact. Pt has been seen at PCP for same and had MRI and EEG which "were normal".

## 2014-03-10 ENCOUNTER — Encounter: Payer: Self-pay | Admitting: Neurology

## 2014-03-10 ENCOUNTER — Ambulatory Visit (INDEPENDENT_AMBULATORY_CARE_PROVIDER_SITE_OTHER): Payer: BC Managed Care – PPO | Admitting: Neurology

## 2014-03-10 ENCOUNTER — Emergency Department (HOSPITAL_COMMUNITY)
Admission: EM | Admit: 2014-03-10 | Discharge: 2014-03-10 | Discharge status: Against Medical Advice | Payer: BC Managed Care – PPO | Attending: Emergency Medicine | Admitting: Emergency Medicine

## 2014-03-10 VITALS — BP 115/68 | HR 76 | Ht 62.0 in | Wt 223.0 lb

## 2014-03-10 DIAGNOSIS — R6889 Other general symptoms and signs: Secondary | ICD-10-CM

## 2014-03-10 DIAGNOSIS — G4733 Obstructive sleep apnea (adult) (pediatric): Secondary | ICD-10-CM

## 2014-03-10 NOTE — Patient Instructions (Addendum)
Please continue using your CPAP regularly. While your insurance requires that you use CPAP at least 4 hours each night on 70% of the nights, I recommend, that you not skip any nights and use it throughout the night if you can. Getting used to CPAP and staying with the treatment long term does take time and patience and discipline. Untreated obstructive sleep apnea when it is moderate to severe can have an adverse impact on cardiovascular health and raise her risk for heart disease, arrhythmias, hypertension, congestive heart failure, stroke and diabetes. Untreated obstructive sleep apnea causes sleep disruption, nonrestorative sleep, and sleep deprivation. This can have an impact on your day to day functioning and cause daytime sleepiness and impairment of cognitive function, memory loss, mood disturbance, and problems focussing. Using CPAP regularly can improve these symptoms.  Follow up in 12 months for sleep apnea.

## 2014-03-10 NOTE — ED Notes (Signed)
Informed that pt left.

## 2014-03-10 NOTE — Progress Notes (Signed)
Subjective:    Patient ID: Sara [name] is a 58 y.o. female.  HPI    Interim history:   Sara [name] is a 58 year old right-handed woman with an underlying medical history of thrombocytosis, hypertension, type 2 diabetes, gout, renal artery stenosis, s/p stent placement in 2013, peripheral artery disease, hyperlipidemia, obesity, left ventricular hypertrophy on echocardiogram, and prior diagnosis of obstructive sleep apnea, who presents for followup consultation of her obstructive sleep apnea. She is unaccompanied today. I first met her on 11/11/2013 at the request of Dr. Jannifer Franklin at which time I suggested she return for sleep study to get retested for sleep apnea and potentially treated with CPAP. She had stopped using CPAP about a year prior. She reported that CPAP was very cumbersome to use and the pressure felt too high. Her sleep study report from 11/26/2010, which was performed at the [facility] showed: Sleep efficiency of 54.95% with a sleep latency of 9 minutes and REM latency normal at 130 minutes. She had a mildly elevated arousal index of 10.7 per hour. She had 6 obstructive apneas and 57 hypopneas. Her overall AHI was 16.4 per hour, rising to 35.3 per hour and REM sleep. The average oxygen saturation was 93% and her nadir was 81%. Mild snoring was reported. She had mild periodic leg movements of sleep at 17.2 per hour with an associated arousal index of 3.4 per hour. She had a split-night sleep study on 11/20/2013 and I went over her test results with her in detail today. Her baseline sleep efficiency was reduced at 57.2% with a latency to sleep of 39.5 minutes and wake after sleep onset of and 15.3% of REM sleep with a normal REM latency. She had no significant periodic leg movements or cardiac arrhythmias. Her total AHI was 73.9 per hour. Oxygen at baseline was 92%, nadir was 85%. She was then titrated on CPAP and had an increased percentage of REM sleep at 27.9%.  Average oxygen saturation was 91%, nadir was 85%. She was titrated on CPAP from 4-9 cm and had an AHI of 2.9 events at the final pressure with supine REM sleep achieved. She did have mild periodic leg movements of 15.7 per hour with no significant arousals. I reviewed the patient's CPAP compliance data from 12/13/2013 to 01/11/2014, which is a total of 30 days, during which time the patient used CPAP every day. The average usage for all days was 7 hours and 14 minutes. The percent used days greater than 4 hours was 100 %, indicating superb compliance. The residual AHI was 2 per hour, indicating an appropriate treatment pressure of 9 cwp with EPR of 2. Today, I reviewed her compliance data from 02/08/2014 through 03/09/2014 which is the last 30 days during which time she uses CPAP every night. Percent used days greater than 4 hours was 90%, indicating very good compliance. Average usage was 7 hours and 10 minutes, residual AHI level at 1.7 per hour and leak was low at 8.6 at the 95th percentile. Today, she reports doing well with CPAP, and feeling well generally speaking. She reports an episode yesterday and another episode last week. She had a staring spell and was not responsive as well and this was witnessed by her daughter and 2 nieces. She was taken to [facility] ER, but did not get seen, as they waited for 3 hours and had another projected wait time for 4 hours, so they left. She has been on hydroxyurea for 2 years and has  been on a study drug for gout for about a year.   Her Past Medical History Is Significant For: Past Medical History  Diagnosis Date  . Thrombocytosis   . Hypertension   . Diabetes mellitus   . Gout   . Renal artery stenosis 12/13/2010    PTCA and Stent - right  . Syncopal episodes   . PAD (peripheral artery disease)   . Hyperlipemia   . Morbid obesity   . LVH (left ventricular hypertrophy)     12/21/09 Echo -mild asymmetric LVH,EF =>55%  . OSA (obstructive sleep apnea)     Her  Past Surgical History Is Significant For: Past Surgical History  Procedure Laterality Date  . Breast reduction surgery  1995    S/P Bilateral breast reduction  . Cesarean section  1980, 1986    Times  Two.  . Breast cyst excision      S/P Benign Right Breast Cyst Removal.  . Partial hysterectomy  1994  . Extremity cyst excision  1993    left wrist  . Renal artery stent placement Right     Her Family History Is Significant For: Family History  Problem Relation Age of Onset  . Breast cancer Maternal Aunt     2 maternal aunts had breast cancer.  Marland Kitchen Heart failure Mother   . Diabetes Father   . Diabetes Sister   . Hypertension Sister   . Breast cancer Maternal Grandmother   . Hypertension Sister   . Hypertension Sister     Her Social History Is Significant For: History   Social History  . Marital Status: Married    Spouse Name: Juanda Crumble    Number of Children: 2  . Years of Education: COLLEGE   Occupational History  . [employer].    Social History Main Topics  . Smoking status: Never Smoker   . Smokeless tobacco: Never Used  . Alcohol Use: No  . Drug Use: No  . Sexual Activity: None   Other Topics Concern  . None   Social History Narrative   Patient lives at home with family.   Caffeine Use: rarely    Her Allergies Are:  Allergies  Allergen Reactions  . Sulfa Antibiotics Itching  :   Her Current Medications Are:  Outpatient Encounter Prescriptions as of 03/10/2014  Medication Sig  . aspirin 81 MG tablet Take 81 mg by mouth daily.  Marland Kitchen atorvastatin (LIPITOR) 10 MG tablet Take 10 mg by mouth every evening.   . Cholecalciferol (VITAMIN D3) 2000 UNITS capsule Take 2,000 Units by mouth daily.  . hydroxyurea (HYDREA) 500 MG capsule Take 500 mg by mouth 2 (two) times daily. May take with food to minimize GI side effects.  . nebivolol (BYSTOLIC) 5 MG tablet Take 5 mg by mouth daily.  . STUDY MEDICATION Take 100 mg by mouth daily. Research study with Dr.  Baird Cancer - pt may be receiving placebo or allopurinol Protocol [id number] 1 capsule [id number] 1 capsule  . valsartan (DIOVAN) 80 MG tablet Take 80 mg by mouth every evening.   . vitamin C (ASCORBIC ACID) 500 MG tablet Take 500 mg by mouth daily.  :  Review of Systems:  Out of a complete 14 point review of systems, all are reviewed and negative with the exception of these symptoms as listed below: Review of Systems  All other systems reviewed and are negative.   Objective:  Neurologic Exam  Physical Exam Physical Examination:   Filed Vitals:   03/10/14  1022  BP: 115/68  Pulse: 76   General Examination: The patient is a very pleasant 58 y.o. female in no acute distress. She appears well-developed and well-nourished and well groomed. She is obese. She is in good spirits today.   HEENT: Normocephalic, atraumatic, pupils are equal, round and reactive to light and accommodation. Funduscopic exam is normal with sharp disc margins noted. Extraocular tracking is good without limitation to gaze excursion or nystagmus noted. Normal smooth pursuit is noted. Hearing is grossly intact. Face is symmetric with normal facial animation and normal facial sensation. Speech is clear with no dysarthria noted. There is no hypophonia. There is no lip, neck/head, jaw or voice tremor. Neck is supple with full range of passive and active motion. There are no carotid bruits on auscultation. Oropharynx exam reveals: mild mouth dryness, adequate dental hygiene and moderate airway crowding, due to large tongue, elongated uvula and tonsils in place. Mallampati is class II. Tongue protrudes centrally and palate elevates symmetrically. Tonsils are 1+.   Chest: Clear to auscultation without wheezing, rhonchi or crackles noted.  Heart: S1+S2+0, regular and normal without murmurs, rubs or gallops noted.   Abdomen: Soft, non-tender and non-distended with normal bowel sounds appreciated on  auscultation.  Extremities: There is trace pitting edema in the distal lower extremities bilaterally, around her ankles. Pedal pulses are intact.  Skin: Warm and dry without trophic changes noted. There are no varicose veins.  Musculoskeletal: exam reveals no obvious joint deformities, tenderness or joint swelling or erythema.   Neurologically:  Mental status: The patient is awake, alert and oriented in all 4 spheres. Her memory, attention, language and knowledge are appropriate. There is no aphasia, agnosia, apraxia or anomia. Speech is clear with normal prosody and enunciation. Thought process is linear. Mood is congruent and affect is constricted.  Cranial nerves are as described above under HEENT exam. In addition, shoulder shrug is normal with equal shoulder height noted. Motor exam: Normal bulk, strength and tone is noted. There is no drift, tremor or rebound. Romberg is negative. Reflexes are 2+ throughout. Toes are downgoing bilaterally. Fine motor skills are intact with normal finger taps, normal hand movements, normal rapid alternating patting, normal foot taps and normal foot agility.  Cerebellar testing shows no dysmetria or intention tremor on finger to nose testing. Heel to shin is unremarkable bilaterally. There is no truncal or gait ataxia.  Sensory exam is intact to light touch, pinprick, vibration, temperature sense in the upper and lower extremities.  Gait, station and balance are unremarkable. No veering to one side is noted. No leaning to one side is noted. Posture is age-appropriate and stance is narrow based. No problems turning are noted. She turns en bloc. Tandem walk is unremarkable. Intact toe and heel stance is noted.               Assessment and Plan:   In summary, Sara [name] is a very pleasant 58 y.o.-year old female with an underlying medical history of thrombocytosis, hypertension, type 2 diabetes, gout, renal artery stenosis, s/p stent placement in 2013,  peripheral artery disease, hyperlipidemia, obesity, left ventricular hypertrophy on echocardiogram, with a history of OSA, now established on CPAP treatment at a pressure of 9 cwp. Her physical exam is stable and She indicates good results with the use of CPAP, and good tolerance of the pressure and mask. I reviewed the sleep study results again with them and the recent compliance data and congratulated her on her treatment adherence and  encouraged her to continue to use CPAP regularly to help reduce cardiovascular risk. I am not sure what to make of her spell yesterday. She is worried, that she is having a SEs from either the Hydrea, Valsartan or the study drug she is on. She does endorse additional stress as she had extended family visit them and she was cooking and quite stressed. Her neurological exam is non-focal and she had w/u with EEG and MRI recently. She is encouraged to discuss her symptoms with her PCP and cardiologist and Dr. Juliann Mule as well.  We also talked about trying to maintaining a healthy lifestyle in general. I encouraged the patient to eat healthy, exercise daily and keep well hydrated, to keep a scheduled bedtime and wake time routine, to not skip any meals and eat healthy snacks in between meals and to have protein with every meal. I stressed the importance of regular exercise.   I answered all their questions today and the patient and her family were in agreement with the above outlined plan. I would like to see the patient back in 12 months, sooner if the need arises and encouraged her to call with any interim questions, concerns, problems or updates. She can see Dr. Jannifer Franklin and/or Ms. Lam as previously planned.

## 2014-03-17 ENCOUNTER — Emergency Department (HOSPITAL_COMMUNITY)
Admission: EM | Admit: 2014-03-17 | Discharge: 2014-03-17 | Disposition: A | Discharge status: Home / Self Care | Payer: BC Managed Care – PPO | Source: Home / Self Care | Attending: Emergency Medicine | Admitting: Emergency Medicine

## 2014-03-17 ENCOUNTER — Encounter (HOSPITAL_COMMUNITY): Payer: Self-pay | Admitting: Emergency Medicine

## 2014-03-17 ENCOUNTER — Telehealth: Payer: Self-pay | Admitting: Neurology

## 2014-03-17 DIAGNOSIS — R569 Unspecified convulsions: Secondary | ICD-10-CM

## 2014-03-17 NOTE — ED Notes (Signed)
Reports after taking Hydrea med this evening around a quarter to four became disoriented for a period of 10 to 15 mins.   States she could not remembered what happened during that period of time.

## 2014-03-17 NOTE — ED Provider Notes (Signed)
Chief Complaint   Chief Complaint  Patient presents with  . Altered Mental Status     History of Present Illness   Sara [name] is a 58 year old female who has a history of thrombocytosis, hypertension, diabetes, and renal artery stenosis. She has had spells since this past November. These occur about once every one to 2 weeks. She attributes them taking her hydroxyurea tablets. They can last 10-15 minutes. The first spell that she had back in November she had tongue biting and jerking and twitching. She's not had episodes like that since then but has had episodes of confusion, disorientation, and her family members note that she's talking out of her head. During the spells she sometimes acts aggressive and belligerent. After the incident she has no memory for the events. There has been no loss of consciousness, no tongue biting, no tonic-clonic activity, no incontinence of urine or stool. She denies any headache, diplopia, blurry vision, paresthesias, weakness, or difficulty with speech or ambulation. The patient thinks that these are brought on by taking her hydroxyurea pills, although she can take the hydroxyurea pill and not have any symptoms at all. Sometimes they also appear to be related to stress. She's had extensive workup of these episodes including an MRI and EEG which showed nothing. Her neurologist told her she did not think these were neurological.  Review of Systems   Other than as noted above, the patient denies any of the following symptoms: Systemic:  No fever, chills, photophobia, stiff neck. Eye:  No blurred vision or diplopia. ENT:  No nasal congestion or rhinorrhea.  No jaw claudication. Neuro:  No paresthesias, loss of consciousness, seizure activity, muscle weakness, trouble with coordination or gait, trouble speaking or swallowing. Psych:  No depression, anxiety or trouble sleeping.  [place]   Past medical history, family history, social history, meds, and allergies  were reviewed.    Physical Examination     Vital signs:  BP 176/84  Pulse 75  Temp(Src) 98.6 F (37 C) (Oral)  Resp 12  SpO2 96% General:  Alert and oriented.  In no distress. Eye:  Lids and conjunctivas normal.  PERRL,  Full EOMs.  Fundi benign with normal discs and vessels. ENT:  No cranial or facial tenderness to palpation.  TMs and canals clear.  Nasal mucosa was normal and uncongested without any drainage. No intra oral lesions, pharynx clear, mucous membranes moist, dentition normal. Neck:  Supple, full ROM, no tenderness to palpation.  No adenopathy or mass. No carotid bruit. Lungs: Clear to auscultation. Heart: Regular rhythm, no gallop or murmur. Neuro:  Alert and orented times 3.  Speech was clear, fluent, and appropriate.  Cranial nerves intact. No pronator drift, muscle strength normal. Finger to nose normal.  DTRs were 2+ and symmetrical.Station and gait were normal.  Romberg's sign was normal.  Able to perform tandem gait well. Psych:  Normal affect.  Assessment   The encounter diagnosis was Seizures.  I think these are classic temporal lobe seizures. Her EEG was normal, but she was not having a seizure while this was done. I called and spoke to the neurologist on call tonight and he felt she needed an ambulatory EEG monitoring. He will set this up with the [facility]. In the meantime she was told to take all seizure cautions including no swimming, no bathing in a tub, no heights or ladders, and no driving.  Plan   1.  Meds:  The following meds were prescribed:  Discharge Medication List as of 03/17/2014  7:23 PM     She was told to continue all of her medications. It may be that the hydroxyurea is decreasing the seizure threshold and causing some of these, I think she needs it for her thrombocytosis.  2.  Patient Education/Counseling:  The patient was given appropriate handouts, self care instructions, and instructed in symptomatic relief.  She is to  take all seizure precautions as outlined above.  3.  Follow up:  The patient was told to follow up here if no better in 3 to 4 days, or sooner if becoming worse in any way, and given some red flag symptoms such as fever, severe headache, persistent vomiting or new or changing neurological symptoms which would prompt immediate return.  Followup with [facility] next week.      Harden Mo, MD 03/17/14 2111

## 2014-03-17 NOTE — Discharge Instructions (Signed)
[facility] will call you this week about ambulatory EEG.  Call them if you haven't gotten a call by Friday.  Take all seizure precautions until this has been resolved: no swimming, no tub baths, no climbing or heights, no driving.

## 2014-03-17 NOTE — Telephone Encounter (Signed)
Received a call from Dr. Jake Michaelis (urgent care) in regards to frequent episodes Sara [name] has been having. Based on review of notes it appears she has had a normal brain MRI and EEG. Unclear etiology of these events. Due to the fact that she is having frequent events an ambulatory EEG may be a good diagnostic option. Alternatively could consider starting an AED such as Keppra. Due to unclear nature of events I feel that ambulatory EEG may be the best option. As patient is clinically stable at this time I will defer the decision of starting an AED to her current neurologist, Dr Jannifer Franklin. Plan discussed with Dr Jake Michaelis who is in agreement. Will follow up with patient in the morning.

## 2014-03-18 ENCOUNTER — Telehealth: Payer: Self-pay | Admitting: Neurology

## 2014-03-18 NOTE — Telephone Encounter (Signed)
I called patient. The patient was seen in the emergency room for episodes of confusion. In the past, she was given a prescription for Keppra, but she never took the prescription. I have recommended she give this a trial again. If she is amenable to this, she is to contact our office. I will try to get a revisit appointment set up in the next several weeks.

## 2014-03-20 ENCOUNTER — Telehealth: Payer: Self-pay | Admitting: Medical Oncology

## 2014-03-20 ENCOUNTER — Ambulatory Visit (INDEPENDENT_AMBULATORY_CARE_PROVIDER_SITE_OTHER): Payer: BC Managed Care – PPO | Admitting: Cardiology

## 2014-03-20 ENCOUNTER — Encounter: Payer: Self-pay | Admitting: Cardiology

## 2014-03-20 VITALS — BP 138/68 | HR 63 | Ht 62.0 in | Wt 220.0 lb

## 2014-03-20 DIAGNOSIS — G4733 Obstructive sleep apnea (adult) (pediatric): Secondary | ICD-10-CM

## 2014-03-20 DIAGNOSIS — Z9889 Other specified postprocedural states: Secondary | ICD-10-CM

## 2014-03-20 DIAGNOSIS — IMO0002 Reserved for concepts with insufficient information to code with codable children: Secondary | ICD-10-CM

## 2014-03-20 DIAGNOSIS — I701 Atherosclerosis of renal artery: Secondary | ICD-10-CM

## 2014-03-20 DIAGNOSIS — R55 Syncope and collapse: Secondary | ICD-10-CM

## 2014-03-20 MED ORDER — VALSARTAN 80 MG PO TABS: 80.0000 mg | ORAL_TABLET | Freq: Every day | ORAL | Status: DC

## 2014-03-20 NOTE — Patient Instructions (Addendum)
We will check your renal artery stenosis, to make sure the stent is patent. If it is re-occluding we will discuss with Dr. Gwenlyn Found.    If your blood pressure stays consistently elevated then we will increase your diovan.  We do not believe these episodes are cardiac with word repetition during the episodes.  Discuss with Dr. Jannifer Franklin.  Follow up with Dr. Sallyanne Kuster in  6 months unless we find need for "loop recorder" to monitor your heart.

## 2014-03-20 NOTE — Telephone Encounter (Signed)
Pt called our office and left a message to inform us that she went to the ER 5/25 due to having an episode of confusion and being disoriented. She has had these spells from time to time and she thinks it is related to the hydrea. She has been seen by cardiology and neurology. She is currently taking 500 mg Hydrea twice a day. She states she has cut this down to one a day. I discussed with Dr. Juliann Mule and he does not think this is related to the hydrea. Her platelet count is and has been stable. He recommends she continue the hydrea as prescribed and follow up with the neurologist which is scheduled for 03/28/14. She states she is going to try one a day which I informed her this could cause her platelets to increase. She voiced understanding.

## 2014-03-20 NOTE — Progress Notes (Signed)
03/23/2014   PCP: [name], MD   Chief Complaint  Patient presents with  . Follow-up    having catatonic type episodes    Primary Cardiologist: Dr. Bertrum Sol   HPI:  58 year old woman who is had episodes of syncope over the last couple of years.  No etiology has been identified. The patient suspects it might be sleep apnea and she has been wearing CPAP.  Dr. Floyde Parkins has reviewed both her EEG and MRI of the head and found these to be normal.  Patient continues with these episodes at one point it lasted 10-15 minutes and she would repeat the same words over and over and cannot answer questions otherwise.  Patient is concerned it is related to the Hydrea, and a side effect of this drug is seizure. The patient believes when she takes the second dose she has more of these episodes. She has since decreased it to one a day and her hematologist has agreed to try this. He is following her platelets.  Since decreasing to once a day she has not had any further episodes.  Patient is very frustrated with continued episodes she is only 58 years old and would like to continue to work and drive her car.  She is to see Dr. Jannifer Franklin next week for further discussion.  I did discuss with Dr. Sallyanne Kuster and we do not believe these are cardiac in origin.  She has a history of systemic hypertension that became easier to manage after she received a right renal artery stent in 2012. A 30 day event monitor did not show any evidence of meaningful arrhythmia. She had an essentially normal echocardiogram on September with the exception of mild LVH and mildly abnormal relaxation. She had a normal nuclear stress test in 2001  Significant comorbidities include obesity, DM (now controlled with diet only), essential thrombocythemia and borderline renal dysfunction.  It has been sometime since she had her renal artery stent and she tells me her blood pressure somewhat more difficult to control at x160/80.  We will do renal Dopplers to evaluate her stent.   Allergies  Allergen Reactions  . Sulfa Antibiotics Itching    Current Outpatient Prescriptions  Medication Sig Dispense Refill  . aspirin 81 MG tablet Take 81 mg by mouth daily.      Marland Kitchen atorvastatin (LIPITOR) 10 MG tablet Take 10 mg by mouth every evening.       . Cholecalciferol (VITAMIN D3) 2000 UNITS capsule Take 2,000 Units by mouth daily.      . hydroxyurea (HYDREA) 500 MG capsule Take 500 mg by mouth 2 (two) times daily. May take with food to minimize GI side effects.      . nebivolol (BYSTOLIC) 5 MG tablet Take 5 mg by mouth daily.      . STUDY MEDICATION Take 100 mg by mouth daily. Research study with Dr. Baird Cancer - pt may be receiving placebo or allopurinol Protocol [id number] 1 capsule [id number] 1 capsule      . valsartan (DIOVAN) 80 MG tablet Take 1 tablet (80 mg total) by mouth daily.  30 tablet  6  . vitamin C (ASCORBIC ACID) 500 MG tablet Take 500 mg by mouth daily.       No current facility-administered medications for this visit.    Past Medical History  Diagnosis Date  . Thrombocytosis   . Hypertension   . Diabetes mellitus   . Gout   .  Renal artery stenosis 12/13/2010    PTCA and Stent - right  . Syncopal episodes   . PAD (peripheral artery disease)   . Hyperlipemia   . Morbid obesity   . LVH (left ventricular hypertrophy)     12/21/09 Echo -mild asymmetric LVH,EF =>55%  . OSA (obstructive sleep apnea)     Past Surgical History  Procedure Laterality Date  . Breast reduction surgery  1995    S/P Bilateral breast reduction  . Cesarean section  1980, 1986    Times  Two.  . Breast cyst excision      S/P Benign Right Breast Cyst Removal.  . Partial hysterectomy  1994  . Extremity cyst excision  1993    left wrist  . Renal artery stent placement Right     ZHY:QMVHQIO:NG colds or fevers, no weight changes Skin:no rashes or ulcers HEENT:no blurred vision, no congestion CV:see HPI PUL:see  HPI GI:no diarrhea constipation or melena, no indigestion GU:no hematuria, no dysuria MS:no joint pain, no claudication Neuro:+ syncope type episodes, no lightheadedness Endo:+ diabetes, no thyroid disease  PHYSICAL EXAM BP 138/68  Pulse 63  Ht 5\' 2"  (1.575 m)  Wt 220 lb (99.791 kg)  BMI 40.23 kg/m2 General:Pleasant affect, NAD Skin:Warm and dry, brisk capillary refill HEENT:normocephalic, sclera clear, mucus membranes moist Neck:supple, no JVD, no bruits  Heart:S1S2 RRR without murmur, gallup, rub or click Lungs:clear without rales, rhonchi, or wheezes EXB:MWUX, non tender, + BS, do not palpate liver spleen or masses Ext:no lower ext edema, 2+ pedal pulses, 2+ radial pulses Neuro:alert and oriented X 3, MAE, follows commands, + facial symmetry, equal strength of upper and lower extremities  EKG:SR  Normal EKG no changes from previous.  ASSESSMENT AND PLAN Syncope Continues with episodes of blank stares, repetitive words. These do not sound cardiac in origin. She believes related to [facility] and states the episodes occur after her second dose usually.  She has reduced the Hydrea to one daily and her hematologist is aware.  She is to see Dr. Jannifer Franklin next week and will discuss possible seizure activity from the [facility].  History of renal stent More frequent episodes of hypertension at 160/80. She would prefer not to increase her Diovan if possible. We'll check renal artery Dopplers to evaluate the stent to ensure patency. If her blood pressure continues to be elevated we would recommend increasing the Diovan.  OSA (obstructive sleep apnea) Using her CPAP   Followup with Dr. Sallyanne Kuster in 6 months

## 2014-03-21 ENCOUNTER — Ambulatory Visit (HOSPITAL_COMMUNITY)
Admission: RE | Admit: 2014-03-21 | Discharge: 2014-03-21 | Disposition: A | Discharge status: Home / Self Care | Payer: BC Managed Care – PPO | Source: Ambulatory Visit | Attending: Internal Medicine | Admitting: Internal Medicine

## 2014-03-21 ENCOUNTER — Telehealth: Payer: Self-pay | Admitting: Cardiovascular Disease

## 2014-03-21 DIAGNOSIS — I701 Atherosclerosis of renal artery: Secondary | ICD-10-CM | POA: Insufficient documentation

## 2014-03-21 DIAGNOSIS — I10 Essential (primary) hypertension: Secondary | ICD-10-CM

## 2014-03-21 NOTE — Progress Notes (Signed)
Renal Duplex Completed. Lekendrick Alpern, BS, RDMS, RVT  

## 2014-03-21 NOTE — Telephone Encounter (Signed)
Spoke with patient and let her know that her samples would be upfront at the desk for her. Patient voiced understanding.

## 2014-03-21 NOTE — Telephone Encounter (Signed)
Would like some samples of Bystolic 5 mg please.

## 2014-03-23 NOTE — Assessment & Plan Note (Signed)
More frequent episodes of hypertension at 160/80. She would prefer not to increase her Diovan if possible. We'll check renal artery Dopplers to evaluate the stent to ensure patency. If her blood pressure continues to be elevated we would recommend increasing the Diovan.

## 2014-03-23 NOTE — Assessment & Plan Note (Signed)
Continues with episodes of blank stares, repetitive words. These do not sound cardiac in origin. She believes related to [facility] and states the episodes occur after her second dose usually.  She has reduced the Hydrea to one daily and her hematologist is aware.  She is to see Dr. Jannifer Franklin next week and will discuss possible seizure activity from the [facility].

## 2014-03-23 NOTE — Assessment & Plan Note (Signed)
Using her CPAP

## 2014-03-28 ENCOUNTER — Encounter: Payer: Self-pay | Admitting: Adult Health

## 2014-03-28 ENCOUNTER — Ambulatory Visit (INDEPENDENT_AMBULATORY_CARE_PROVIDER_SITE_OTHER): Payer: BC Managed Care – PPO | Admitting: Adult Health

## 2014-03-28 VITALS — BP 154/73 | HR 65 | Ht 61.75 in | Wt 224.0 lb

## 2014-03-28 DIAGNOSIS — G40309 Generalized idiopathic epilepsy and epileptic syndromes, not intractable, without status epilepticus: Secondary | ICD-10-CM

## 2014-03-28 DIAGNOSIS — G40A09 Absence epileptic syndrome, not intractable, without status epilepticus: Secondary | ICD-10-CM | POA: Insufficient documentation

## 2014-03-28 DIAGNOSIS — Z9989 Dependence on other enabling machines and devices: Secondary | ICD-10-CM

## 2014-03-28 DIAGNOSIS — G4733 Obstructive sleep apnea (adult) (pediatric): Secondary | ICD-10-CM

## 2014-03-28 MED ORDER — LEVETIRACETAM 500 MG PO TABS: ORAL_TABLET | ORAL | Status: DC

## 2014-03-28 NOTE — Progress Notes (Signed)
I have read the note, and I agree with the clinical assessment and plan.  Sara [name]  I spent approximately 15 minutes with face-to-face time with this patient, discussing therapeutic options.

## 2014-03-28 NOTE — Patient Instructions (Addendum)
Keppra: Take 1/2 tablet twice daily for 2 weeks, if tolerating then increase to 1 tablet twice daily.   Seizure, Adult A seizure means there is unusual activity in the brain. A seizure can cause changes in attention or behavior. Seizures often cause shaking (convulsions). Seizures often last from 30 seconds to 2 minutes. HOME CARE   If you are given medicines, take them exactly as told by your doctor.  Keep all doctor visits as told.  Do not swim or drive until your doctor says it is okay.  Teach others what to do if you have a seizure. They should:  Lay you on the ground.  Put a cushion under your head.  Loosen any tight clothing around your neck.  Turn you on your side.  Stay with you until you get better. GET HELP RIGHT AWAY IF:   The seizure lasts longer than 2 to 5 minutes.  The seizure is very bad.  The person does not wake up after the seizure.  The person's attention or behavior changes. Drive the person to the emergency room or call your local emergency services (911 in [place] MAKE SURE YOU:   Understand these instructions.  Will watch your condition.  Will get help right away if you are not doing well or get worse. Document Released: 03/28/2008 Document Revised: 01/02/2012 Document Reviewed: 09/28/2011 [facility] Patient Information 2014 [place].

## 2014-03-28 NOTE — Progress Notes (Signed)
PATIENT: Sara [name] DOB: August 28, 1956  REASON FOR VISIT: follow up HISTORY FROM: patient  HISTORY OF PRESENT ILLNESS: Sara [name] is a 58 year old female with a history of sleep apnea on CPAP, and seizures. She returns today for an evaluation. Patient's prior compliance reports with CPAP have been excellent.  She continues to use it nightly. Patient is continuing to have staring spells and husband reports after the starring spells is over patient is confused but after several minutes she returns to baseline. Patient is convinced that these episodes are related to the medication she takes. She believes that the Hydrea and valsartan are causing these events. She states that Dr. Juliann Mule does not want to reduce the dose or stop Hydrea. Patient is very upset about all of this. Patient has been on Hydrea for 2-3 years, states that these starring episodes started in December approximately. Patient is not operating a motor vehicle. States that she is going to lose her job if something doesn't change because her job requires her to drive. Patient's husband is present with her today.   REVIEW OF SYSTEMS: Full 14 system review of systems performed and notable only for:  Constitutional: N/A  Eyes: N/A Ear/Nose/Throat: N/A  Skin: N/A  Cardiovascular: N/A  Respiratory: N/A  Gastrointestinal: N/A  Genitourinary: N/A Hematology/Lymphatic: N/A  Endocrine: N/A Musculoskeletal:N/A  Allergy/Immunology: N/A  Neurological: N/A Psychiatric: N/A Sleep: N/A   ALLERGIES: Allergies  Allergen Reactions  . Sulfa Antibiotics Itching    HOME MEDICATIONS: Outpatient Prescriptions Prior to Visit  Medication Sig Dispense Refill  . aspirin 81 MG tablet Take 81 mg by mouth daily.      Marland Kitchen atorvastatin (LIPITOR) 10 MG tablet Take 10 mg by mouth every evening.       . Cholecalciferol (VITAMIN D3) 2000 UNITS capsule Take 2,000 Units by mouth daily.      . hydroxyurea (HYDREA) 500 MG capsule Take 500 mg by mouth  2 (two) times daily. May take with food to minimize GI side effects.      . nebivolol (BYSTOLIC) 5 MG tablet Take 5 mg by mouth daily.      . STUDY MEDICATION Take 100 mg by mouth daily. Research study with Dr. Baird Cancer - pt may be receiving placebo or allopurinol Protocol [id number] 1 capsule [id number] 1 capsule      . valsartan (DIOVAN) 80 MG tablet Take 1 tablet (80 mg total) by mouth daily.  30 tablet  6  . vitamin C (ASCORBIC ACID) 500 MG tablet Take 500 mg by mouth daily.       No facility-administered medications prior to visit.    PAST MEDICAL HISTORY: Past Medical History  Diagnosis Date  . Thrombocytosis   . Hypertension   . Diabetes mellitus   . Gout   . Renal artery stenosis 12/13/2010    PTCA and Stent - right  . Syncopal episodes   . PAD (peripheral artery disease)   . Hyperlipemia   . Morbid obesity   . LVH (left ventricular hypertrophy)     12/21/09 Echo -mild asymmetric LVH,EF =>55%  . OSA (obstructive sleep apnea)     PAST SURGICAL HISTORY: Past Surgical History  Procedure Laterality Date  . Breast reduction surgery  1995    S/P Bilateral breast reduction  . Cesarean section  1980, 1986    Times  Two.  . Breast cyst excision      S/P Benign Right Breast Cyst Removal.  . Partial hysterectomy  1994  . Extremity cyst excision  1993    left wrist  . Renal artery stent placement Right     FAMILY HISTORY: Family History  Problem Relation Age of Onset  . Breast cancer Maternal Aunt     2 maternal aunts had breast cancer.  Marland Kitchen Heart failure Mother   . Diabetes Father   . Diabetes Sister   . Hypertension Sister   . Breast cancer Maternal Grandmother   . Hypertension Sister   . Hypertension Sister     SOCIAL HISTORY: History   Social History  . Marital Status: Married    Spouse Name: Sara [name]    Number of Children: 2  . Years of Education: COLLEGE   Occupational History  . [employer].    Social History Main Topics  . Smoking  status: Never Smoker   . Smokeless tobacco: Never Used  . Alcohol Use: No  . Drug Use: No  . Sexual Activity: Yes   Other Topics Concern  . Not on file   Social History Narrative   Patient lives at home with family.   Caffeine Use: rarely      PHYSICAL EXAM  Filed Vitals:   03/28/14 1521  Height: 5' 1.75" (1.568 m)  Weight: 224 lb (101.606 kg)   Body mass index is 41.33 kg/(m^2).  Marland KitchenGeneralized: Well developed, in no acute distress   Neurological examination  Mentation: Alert oriented to time, place, history taking. Follows all commands speech and language fluent Cranial nerve II-XII:  Extraocular movements were full, visual field were full on confrontational test.  Motor: The motor testing reveals 5 over 5 strength of all 4 extremities. Good symmetric motor tone is noted throughout.  Sensory: Sensory testing is intact to soft touch on all 4 extremities. No evidence of extinction is noted.  Coordination: Cerebellar testing reveals good finger-nose-finger and heel-to-shin bilaterally.  Gait and station: Gait is normal. Tandem gait is normal. Romberg is negative. No drift is seen.  Reflexes: Deep tendon reflexes are symmetric and normal bilaterally.    DIAGNOSTIC DATA (LABS, IMAGING, TESTING) - I reviewed patient records, labs, notes, testing and imaging myself where available.  Lab Results  Component Value Date   WBC 5.0 02/27/2014   HGB 13.0 02/27/2014   HCT 38.0 02/27/2014   MCV 111.1* 02/27/2014   PLT 355 02/27/2014      Component Value Date/Time   NA 144 02/27/2014 0907   NA 143 01/05/2014 2005   K 4.0 02/27/2014 0907   K 4.0 01/05/2014 2005   CL 104 01/05/2014 2005   CL 102 03/19/2013 1535   CO2 26 02/27/2014 0907   CO2 25 01/05/2014 2005   GLUCOSE 99 02/27/2014 0907   GLUCOSE 111* 01/05/2014 2005   GLUCOSE 117* 03/19/2013 1535   BUN 17.6 02/27/2014 0907   BUN 20 01/05/2014 2005   CREATININE 1.2* 02/27/2014 0907   CREATININE 1.18* 01/05/2014 2005   CALCIUM 9.8 02/27/2014 0907    CALCIUM 9.0 01/05/2014 2005   PROT 7.2 02/27/2014 0907   PROT 7.0 01/05/2014 2005   ALBUMIN 3.7 02/27/2014 0907   ALBUMIN 3.5 01/05/2014 2005   AST 19 02/27/2014 0907   AST 21 01/05/2014 2005   ALT 17 02/27/2014 0907   ALT 18 01/05/2014 2005   ALKPHOS 89 02/27/2014 0907   ALKPHOS 82 01/05/2014 2005   BILITOT 0.39 02/27/2014 0907   BILITOT 0.2* 01/05/2014 2005   GFRNONAA 49* 01/05/2014 2005   GFRAA 57* 01/05/2014 2005   Lab  Results  Component Value Date   CHOL 100 07/21/2013   HDL 44 07/21/2013   LDLCALC 46 07/21/2013   TRIG 52 07/21/2013   CHOLHDL 2.3 07/21/2013   Lab Results  Component Value Date   HGBA1C 5.9* 07/20/2013   No results found for this basename: VITAMINB12   Lab Results  Component Value Date   TSH 2.839 07/21/2013      ASSESSMENT AND PLAN 58 y.o. year old female  has a past medical history of Thrombocytosis; Hypertension; Diabetes mellitus; Gout; Renal artery stenosis (12/13/2010); Syncopal episodes; PAD (peripheral artery disease); Hyperlipemia; Morbid obesity; LVH (left ventricular hypertrophy); and OSA (obstructive sleep apnea). here with   1. Typical absence seizures 2. OSA on CPAP  Patient has continued to have starring episodes followed by confusion. This most likely is an absence seizure. We will start Keppra 500 mg-- patient should take 1/2 tablet BID for 2 weeks, if tolerating well then increase to 1 tablet BID. Patient was advised to let us know if she is unable to tolerate the medication or if she has another seizure. Patient was advised to not drive until she has been seizure free for 6 months. Patient continues to use CPAP nightly. Patient should follow-up in 6 months or sooner if needed.  Ward Givens, MSN, NP-C 03/28/2014, 3:23 PM [facility] [address] [place], [place] [address] [phone]  Note: This document was prepared with digital dictation and possible smart phrase technology. Any transcriptional errors that result from this  process are unintentional.

## 2014-04-03 ENCOUNTER — Telehealth: Payer: Self-pay | Admitting: Cardiovascular Disease

## 2014-04-04 NOTE — Telephone Encounter (Signed)
Closed encounter °

## 2014-04-05 ENCOUNTER — Telehealth: Payer: Self-pay | Admitting: Neurology

## 2014-04-05 ENCOUNTER — Encounter (HOSPITAL_COMMUNITY): Payer: Self-pay | Admitting: Emergency Medicine

## 2014-04-05 ENCOUNTER — Emergency Department (HOSPITAL_COMMUNITY)
Admission: EM | Admit: 2014-04-05 | Discharge: 2014-04-05 | Disposition: A | Discharge status: Home / Self Care | Payer: BC Managed Care – PPO | Attending: Emergency Medicine | Admitting: Emergency Medicine

## 2014-04-05 DIAGNOSIS — Z79899 Other long term (current) drug therapy: Secondary | ICD-10-CM | POA: Insufficient documentation

## 2014-04-05 DIAGNOSIS — G40A09 Absence epileptic syndrome, not intractable, without status epilepticus: Secondary | ICD-10-CM

## 2014-04-05 DIAGNOSIS — G40309 Generalized idiopathic epilepsy and epileptic syndromes, not intractable, without status epilepticus: Secondary | ICD-10-CM | POA: Insufficient documentation

## 2014-04-05 DIAGNOSIS — E119 Type 2 diabetes mellitus without complications: Secondary | ICD-10-CM | POA: Insufficient documentation

## 2014-04-05 DIAGNOSIS — Z862 Personal history of diseases of the blood and blood-forming organs and certain disorders involving the immune mechanism: Secondary | ICD-10-CM | POA: Insufficient documentation

## 2014-04-05 DIAGNOSIS — E785 Hyperlipidemia, unspecified: Secondary | ICD-10-CM | POA: Insufficient documentation

## 2014-04-05 DIAGNOSIS — I10 Essential (primary) hypertension: Secondary | ICD-10-CM | POA: Insufficient documentation

## 2014-04-05 DIAGNOSIS — Z7982 Long term (current) use of aspirin: Secondary | ICD-10-CM | POA: Insufficient documentation

## 2014-04-05 LAB — CBC WITH DIFFERENTIAL/PLATELET
Basophils Absolute: 0 10*3/uL (ref 0.0–0.1)
Basophils Relative: 1 % (ref 0–1)
Eosinophils Absolute: 0.2 10*3/uL (ref 0.0–0.7)
Eosinophils Relative: 3 % (ref 0–5)
HCT: 38.2 % (ref 36.0–46.0)
Hemoglobin: 12.9 g/dL (ref 12.0–15.0)
LYMPHS ABS: 1.7 10*3/uL (ref 0.7–4.0)
LYMPHS PCT: 28 % (ref 12–46)
MCH: 37 pg — ABNORMAL HIGH (ref 26.0–34.0)
MCHC: 33.8 g/dL (ref 30.0–36.0)
MCV: 109.5 fL — AB (ref 78.0–100.0)
Monocytes Absolute: 0.4 10*3/uL (ref 0.1–1.0)
Monocytes Relative: 7 % (ref 3–12)
NEUTROS PCT: 61 % (ref 43–77)
Neutro Abs: 3.8 10*3/uL (ref 1.7–7.7)
PLATELETS: 404 10*3/uL — AB (ref 150–400)
RBC: 3.49 MIL/uL — ABNORMAL LOW (ref 3.87–5.11)
RDW: 11.5 % (ref 11.5–15.5)
WBC: 6.1 10*3/uL (ref 4.0–10.5)

## 2014-04-05 LAB — COMPREHENSIVE METABOLIC PANEL
ALT: 22 U/L (ref 0–35)
AST: 21 U/L (ref 0–37)
Albumin: 3.7 g/dL (ref 3.5–5.2)
Alkaline Phosphatase: 79 U/L (ref 39–117)
BILIRUBIN TOTAL: 0.3 mg/dL (ref 0.3–1.2)
BUN: 19 mg/dL (ref 6–23)
CHLORIDE: 102 meq/L (ref 96–112)
CO2: 26 meq/L (ref 19–32)
Calcium: 9.3 mg/dL (ref 8.4–10.5)
Creatinine, Ser: 1.22 mg/dL — ABNORMAL HIGH (ref 0.50–1.10)
GFR calc Af Amer: 55 mL/min — ABNORMAL LOW (ref >90)
GFR calc non Af Amer: 48 mL/min — ABNORMAL LOW (ref >90)
Glucose, Bld: 201 mg/dL — ABNORMAL HIGH (ref 70–99)
Potassium: 3.7 mEq/L (ref 3.7–5.3)
SODIUM: 142 meq/L (ref 137–147)
Total Protein: 7.3 g/dL (ref 6.0–8.3)

## 2014-04-05 MED ORDER — LEVETIRACETAM 500 MG PO TABS
500.0000 mg | ORAL_TABLET | Freq: Once | ORAL | Status: AC
  Administered 2014-04-05: 500 mg via ORAL
  Filled 2014-04-05: qty 1

## 2014-04-05 NOTE — ED Provider Notes (Signed)
CSN: [id number]     Arrival date & time 04/05/14  1650 History   First MD Initiated Contact with Patient 04/05/14 1723     Chief Complaint  Patient presents with  . poss seizure      (Consider location/radiation/quality/duration/timing/severity/associated sxs/prior Treatment) HPI Comments: Patient is a 58 yo F PMHx significant for thrombocytosis, HTN, DM, Renal artery stenosis, PAD, HLD, Absence seizures presenting to the ED for an absence seizure that occurred around 4PM this evening. Patient states she had been busy running errands all day, was about to attend a graduation this evening, she took her afternoon dose of the Diovan and shortly after felt very "funny" and "out of it." Her husband states that she stared off for approximately 15 minutes and afterwards took about 15 minutes for her post ictal state to resolve. She was recently placed on Keppra 500 mg 1/2 tablet BID for two weeks with plan to increase to 1 tablet BID. Patient states that she has been taking 1/2 tablet once a day at [facility] since it was prescribed on 03/28/14. She states since November the neurologist has not determined the exact cause of her seizures, she's had a negative MRI and 2 EEGs. She states she is scheduled to see a neurologist at [facility] in July for further evaluation. Patient expresses her frustration over this.    Past Medical History  Diagnosis Date  . Thrombocytosis   . Hypertension   . Diabetes mellitus   . Gout   . Renal artery stenosis 12/13/2010    PTCA and Stent - right  . Syncopal episodes   . PAD (peripheral artery disease)   . Hyperlipemia   . Morbid obesity   . LVH (left ventricular hypertrophy)     12/21/09 Echo -mild asymmetric LVH,EF =>55%  . OSA (obstructive sleep apnea)    Past Surgical History  Procedure Laterality Date  . Breast reduction surgery  1995    S/P Bilateral breast reduction  . Cesarean section  1980, 1986    Times  Two.  . Breast cyst excision      S/P Benign  Right Breast Cyst Removal.  . Partial hysterectomy  1994  . Extremity cyst excision  1993    left wrist  . Renal artery stent placement Right    Family History  Problem Relation Age of Onset  . Breast cancer Maternal Aunt     2 maternal aunts had breast cancer.  Marland Kitchen Heart failure Mother   . Diabetes Father   . Diabetes Sister   . Hypertension Sister   . Breast cancer Maternal Grandmother   . Hypertension Sister   . Hypertension Sister    History  Substance Use Topics  . Smoking status: Never Smoker   . Smokeless tobacco: Never Used  . Alcohol Use: No   OB History   Grav Para Term Preterm Abortions TAB SAB Ect Mult Living                 Review of Systems  Constitutional: Negative for fever and chills.  Respiratory: Negative for shortness of breath.   Cardiovascular: Negative for chest pain.  Neurological: Positive for seizures.  All other systems reviewed and are negative.     Allergies  Sulfa antibiotics  Home Medications   Prior to Admission medications   Medication Sig Start Date End Date Taking? Authorizing Provider  aspirin 81 MG tablet Take 81 mg by mouth daily.    Historical Provider, MD  atorvastatin (LIPITOR) 10  MG tablet Take 10 mg by mouth every evening.     Historical Provider, MD  Cholecalciferol (VITAMIN D3) 2000 UNITS capsule Take 2,000 Units by mouth daily.    Historical Provider, MD  hydroxyurea (HYDREA) 500 MG capsule Take 500 mg by mouth daily. May take with food to minimize GI side effects.    Historical Provider, MD  levETIRAcetam (KEPPRA) 500 MG tablet Take 1/2 tablet twice daily for 2 weeks, if tolerating then increase to 1 tablet twice daily. 03/28/14   Ward Givens, NP  nebivolol (BYSTOLIC) 5 MG tablet Take 5 mg by mouth daily.    Historical Provider, MD  STUDY MEDICATION Take 100 mg by mouth daily. Research study with Dr. Baird Cancer - pt may be receiving placebo or allopurinol Protocol [id number] 1 capsule [id number] 1 capsule     Historical Provider, MD  valsartan (DIOVAN) 80 MG tablet Take 1 tablet (80 mg total) by mouth daily. 03/20/14   Cecilie Kicks, NP  vitamin C (ASCORBIC ACID) 500 MG tablet Take 500 mg by mouth daily.    Historical Provider, MD   BP 125/47  Pulse 74  Temp(Src) 98.3 F (36.8 C) (Oral)  Resp 18  Ht 5\' 2"  (1.575 m)  Wt 222 lb (100.699 kg)  BMI 40.59 kg/m2  SpO2 99% Physical Exam  Nursing note and vitals reviewed. Constitutional: She is oriented to person, place, and time. She appears well-developed and well-nourished. No distress.  HENT:  Head: Normocephalic and atraumatic.  Right Ear: External ear normal.  Left Ear: External ear normal.  Nose: Nose normal.  Mouth/Throat: Oropharynx is clear and moist. No oropharyngeal exudate.  Eyes: Conjunctivae and EOM are normal. Pupils are equal, round, and reactive to light.  Neck: Normal range of motion. Neck supple.  Cardiovascular: Normal rate, regular rhythm, normal heart sounds and intact distal pulses.   Pulmonary/Chest: Effort normal and breath sounds normal. No respiratory distress.  Abdominal: Soft. Bowel sounds are normal. There is no tenderness.  Musculoskeletal: Normal range of motion. She exhibits no edema.  Neurological: She is alert and oriented to person, place, and time. She has normal strength. No cranial nerve deficit. Gait normal. GCS eye subscore is 4. GCS verbal subscore is 5. GCS motor subscore is 6.  Sensation grossly intact.  No pronator drift.  Bilateral heel-knee-shin intact.  Skin: Skin is warm and dry. She is not diaphoretic.  Psychiatric: Her speech is normal.    ED Course  Procedures (including critical care time) Medications - No data to display  Labs Review Labs Reviewed  CBC WITH DIFFERENTIAL - Abnormal; Notable for the following:    RBC 3.49 (*)    MCV 109.5 (*)    MCH 37.0 (*)    Platelets 404 (*)    All other components within normal limits  COMPREHENSIVE METABOLIC PANEL    Imaging Review No  results found.   EKG Interpretation None      MDM   Final diagnoses:  Typical absence seizures    Filed Vitals:   04/05/14 1815  BP: 136/57  Pulse: 71  Temp:   Resp: 14   6:58 PM Discussed with Dr. Jannifer Franklin, who agrees patient's seizure is likely d/t underdosing at home. He recommends PO load dose of 500mg  Keppra while in ED and have patient take her medications 500 mg PO BID as recommended.    Afebrile, NAD, non-toxic appearing, AAOx4.  No neuro focal deficits on examination. Patient remained symptom-free while in emergency department. Labs are reviewed. Extensive  conversation with patient regarding workup surrounding her seizures and she is very frustrated that the neurology offices but unable to determine the cause of her seizures. Discussed plan with Dr. Jannifer Franklin suggestive for patient including proper dosing of in today epileptic medicines. Patient is agreeable to plan. Advised PCP and neurology followup. Return precautions were discussed. Patient is agreeable to plan. Patient stable at time of discharge.   Harlow Mares, PA-C 04/05/14 1957

## 2014-04-05 NOTE — Telephone Encounter (Signed)
I called the [facility] ED The patient had a nonconvulsive seizure today. She was not taking the keppra properly. She is to go on 1/2 tablet twice a day instead of one half tablet daily. She will eventually go to 1/2 tablet twice a day.

## 2014-04-05 NOTE — ED Provider Notes (Signed)
Medical screening examination/treatment/procedure(s) were conducted as a shared visit with non-physician practitioner(s) or resident and myself. I personally evaluated the patient during the encounter and agree with the findings and plan unless otherwise indicated.  I have personally reviewed any xrays and/ or EKG's with the provider and I agree with interpretation.  Patient presents to ER after staring/seizure-like episode similar to previous. Patient feels it coming on and has followup with neurology. They should is frustrated as she is unable to drive with these intermittent seizure-like activities in his heart he had an MRI and EEG which were unremarkable. Patient feels seizure activity secondary one of her medications and recently has decreased her hydroxyurea dose has not no significant proven. Her long discussion with her in terms of doing one change at a time to see that helps or worsens or seizures. Patient neuro intact in the ER and well-appearing other than frustration. Neurology recommended giving an oral Keppra Dose in ER and followup outpatient.  Seizure activity   Mariea Clonts, MD 04/05/14 2054

## 2014-04-05 NOTE — ED Notes (Signed)
She just took her bp pill then was [place] when this last episode occurred.  She has also been placed on a seizure med until they figure out the cause

## 2014-04-05 NOTE — ED Notes (Signed)
The pt has been having episodes since she started taking bp meds.  She has seen neurology and has had numerus tests and they have not found anything abnormal.  Her episodes are staring for a few seconds then she is normal no mouth injury no bowel or bladder problems.

## 2014-04-05 NOTE — ED Notes (Signed)
The bp pill she took today was diovan

## 2014-04-05 NOTE — Discharge Instructions (Signed)
Please follow up with Dr. Jannifer Franklin to discuss your visit to the ER today. Please take your Keppra as he has prescribed, twice a day. Please follow up with your primary care physician in 1-2 days. If you do not have one please call the [facility] number listed above. Please read all discharge instructions and return precautions.   Levetiracetam tablets What is this medicine? LEVETIRACETAM (lee ve tye RA se tam) is an antiepileptic drug. It is used with other medicines to treat certain types of seizures. This medicine may be used for other purposes; ask your health care provider or pharmacist if you have questions. COMMON BRAND NAME(S): Keppra What should I tell my health care provider before I take this medicine? They need to know if you have any of these conditions: -kidney disease -suicidal thoughts, plans, or attempt; a previous suicide attempt by you or a family member -an unusual or allergic reaction to levetiracetam, other medicines, foods, dyes, or preservatives -pregnant or trying to get pregnant -breast-feeding How should I use this medicine? Take this medicine by mouth with a glass of water. Follow the directions on the prescription label. Do not cut, crush, or chew this medicine. You may take this medicine with or without food. Take your doses at regular intervals. Do not take your medicine more often than directed. Do not stop taking this medicine or any of your seizure medicines unless instructed by your doctor or health care professional. Stopping your medicine suddenly can increase your seizures or their severity. A special MedGuide will be given to you by the pharmacist with each prescription and refill. Be sure to read this information carefully each time. Contact your pediatrician or health care professional regarding the use of this medication in children. While this drug may be prescribed for children as young as 3 years of age for selected conditions,  precautions do apply. Overdosage: If you think you have taken too much of this medicine contact a poison control center or emergency room at once. NOTE: This medicine is only for you. Do not share this medicine with others. What if I miss a dose? If you miss a dose, take it as soon as you can. If it is almost time for your next dose, take only that dose. Do not take double or extra doses. What may interact with this medicine? -sevelamer This list may not describe all possible interactions. Give your health care provider a list of all the medicines, herbs, non-prescription drugs, or dietary supplements you use. Also tell them if you smoke, drink alcohol, or use illegal drugs. Some items may interact with your medicine. What should I watch for while using this medicine? Visit your doctor or health care professional for a regular check on your progress. Wear a medical identification bracelet or chain to say you have epilepsy, and carry a card that lists all your medications. It is important to take this medicine exactly as instructed by your health care professional. When first starting treatment, your dose may need to be adjusted. It may take weeks or months before your dose is stable. You should contact your doctor or health care professional if your seizures get worse or if you have any new types of seizures. You may get drowsy or dizzy. Do not drive, use machinery, or do anything that needs mental alertness until you know how this medicine affects you. Do not stand or sit up quickly, especially if you are an older patient. This reduces the risk  of dizzy or fainting spells. Alcohol may interfere with the effect of this medicine. Avoid alcoholic drinks. The use of this medicine may increase the chance of suicidal thoughts or actions. Pay special attention to how you are responding while on this medicine. Any worsening of mood, or thoughts of suicide or dying should be reported to your health care  professional right away. Women who become pregnant while using this medicine may enroll in the [place] Pregnancy Registry by calling [phone]. This registry collects information about the safety of antiepileptic drug use during pregnancy. What side effects may I notice from receiving this medicine? Side effects you should report to your doctor or health care professional as soon as possible: -allergic reactions like skin rash, itching or hives, swelling of the face, lips, or tongue -breathing problems -dark urine -general ill feeling or flu-like symptoms -problems with balance, talking, walking -unusually weak or tired -worsening of mood, thoughts or actions of suicide or dying -yellowing of the eyes or skin Side effects that usually do not require medical attention (report to your doctor or health care professional if they continue or are bothersome): -diarrhea -dizzy, drowsy -headache -loss of appetite This list may not describe all possible side effects. Call your doctor for medical advice about side effects. You may report side effects to FDA at 1-800-FDA-1088. Where should I keep my medicine? Keep out of reach of children. Store at room temperature between 15 and 30 degrees C (59 and 86 degrees F). Throw away any unused medicine after the expiration date. NOTE: This sheet is a summary. It may not cover all possible information. If you have questions about this medicine, talk to your doctor, pharmacist, or health care provider.  2014, Elsevier/Gold Standard. (2011-08-24 12:23:20)    Seizure, Adult A seizure is abnormal electrical activity in the brain. Seizures usually last from 30 seconds to 2 minutes. There are various types of seizures. Before a seizure, you may have a warning sensation (aura) that a seizure is about to occur. An aura may include the following symptoms:   Fear or anxiety.  Nausea.  Feeling like the room is spinning  (vertigo).  Vision changes, such as seeing flashing lights or spots. Common symptoms during a seizure include:  A change in attention or behavior (altered mental status).  Convulsions with rhythmic jerking movements.  Drooling.  Rapid eye movements.  Grunting.  Loss of bladder and bowel control.  Bitter taste in the mouth.  Tongue biting. After a seizure, you may feel confused and sleepy. You may also have an injury resulting from convulsions during the seizure. HOME CARE INSTRUCTIONS   If you are given medicines, take them exactly as prescribed by your health care provider.  Keep all follow-up appointments as directed by your health care provider.  Do not swim or drive or engage in risky activity during which a seizure could cause further injury to you or others until your health care provider says it is OK.  Get adequate rest.  Teach friends and family what to do if you have a seizure. They should:  Lay you on the ground to prevent a fall.  Put a cushion under your head.  Loosen any tight clothing around your neck.  Turn you on your side. If vomiting occurs, this helps keep your airway clear.  Stay with you until you recover.  Know whether or not you need emergency care. SEEK IMMEDIATE MEDICAL CARE IF:  The seizure lasts longer than 5 minutes.  The seizure is severe or you do not wake up immediately after the seizure.  You have an altered mental status after the seizure.  You are having more frequent or worsening seizures. Someone should drive you to the emergency department or call local emergency services (911 in [place] MAKE SURE YOU:  Understand these instructions.  Will watch your condition.  Will get help right away if you are not doing well or get worse. Document Released: 10/07/2000 Document Revised: 07/31/2013 Document Reviewed: 05/22/2013 [facility] Patient Information 2014 [employer].

## 2014-04-07 ENCOUNTER — Telehealth: Payer: Self-pay | Admitting: Cardiovascular Disease

## 2014-04-07 ENCOUNTER — Telehealth: Payer: Self-pay | Admitting: Medical Oncology

## 2014-04-07 NOTE — Telephone Encounter (Signed)
RN returned call to patient on work number. Patient requests that Dr Sallyanne Kuster be updated on her recent ED visit. Reports on Saturday she had low BP and absence seizures and felt like she would pass out prior to going in to ED. She is concerned that her BP is too low and it is linked to her seizure activity. She reports that she takes bystolic in AM and valsartan in PM (rx'ed by Dr. Baird Cancer) per ED discharge summary, patient to f/up with PCP and neurology. Advised her to monitor BP at home and take readings to PCP appmt in case medications needed to be adjusted.   Will forward to Dr. Sallyanne Kuster as Juluis Rainier

## 2014-04-07 NOTE — Telephone Encounter (Signed)
Can she please keep a record of BP (twice a day - morning and evening, at rest, sitting down >10 minutes) and send to our office for review after one week?

## 2014-04-07 NOTE — Telephone Encounter (Signed)
Pt called and left a message that she had to go to the ER this week-end. She had labs done and just wanted to inform Dr. Juliann Mule that her platelet count was 404. Dr. Juliann Mule notified.

## 2014-04-07 NOTE — Telephone Encounter (Signed)
Patient wanted to let Dr. Sallyanne Kuster know that she was in the hospital over the weekend---her blood pressure went down to 118/47.  She had taken both of her blood pressure medications.

## 2014-04-07 NOTE — Telephone Encounter (Signed)
RN called patient and provided instructions per Dr. Sallyanne Kuster. Patient voiced understanding and agreed with plan.

## 2014-04-10 ENCOUNTER — Emergency Department (HOSPITAL_COMMUNITY)
Admission: EM | Admit: 2014-04-10 | Discharge: 2014-04-10 | Disposition: A | Discharge status: Home / Self Care | Payer: BC Managed Care – PPO | Attending: Emergency Medicine | Admitting: Emergency Medicine

## 2014-04-10 ENCOUNTER — Encounter (HOSPITAL_COMMUNITY): Payer: Self-pay | Admitting: Emergency Medicine

## 2014-04-10 DIAGNOSIS — Z862 Personal history of diseases of the blood and blood-forming organs and certain disorders involving the immune mechanism: Secondary | ICD-10-CM | POA: Insufficient documentation

## 2014-04-10 DIAGNOSIS — I517 Cardiomegaly: Secondary | ICD-10-CM | POA: Insufficient documentation

## 2014-04-10 DIAGNOSIS — E785 Hyperlipidemia, unspecified: Secondary | ICD-10-CM | POA: Insufficient documentation

## 2014-04-10 DIAGNOSIS — G40309 Generalized idiopathic epilepsy and epileptic syndromes, not intractable, without status epilepticus: Secondary | ICD-10-CM | POA: Insufficient documentation

## 2014-04-10 DIAGNOSIS — G40A09 Absence epileptic syndrome, not intractable, without status epilepticus: Secondary | ICD-10-CM

## 2014-04-10 DIAGNOSIS — Z79899 Other long term (current) drug therapy: Secondary | ICD-10-CM | POA: Insufficient documentation

## 2014-04-10 DIAGNOSIS — Z7982 Long term (current) use of aspirin: Secondary | ICD-10-CM | POA: Insufficient documentation

## 2014-04-10 DIAGNOSIS — I10 Essential (primary) hypertension: Secondary | ICD-10-CM | POA: Insufficient documentation

## 2014-04-10 DIAGNOSIS — E119 Type 2 diabetes mellitus without complications: Secondary | ICD-10-CM | POA: Insufficient documentation

## 2014-04-10 NOTE — ED Notes (Signed)
The pt has been  Having seizure-like symptoms every time she takes her bp med she has a strange feling and she does not speak for seconds then she is ok.  She has seen dr Jannifer Franklin for the same.  They have kept her on the bp meds

## 2014-04-10 NOTE — Consult Note (Signed)
Reason for Consult:Starring spells Referring Physician: Alvino Chapel  CC: Starring spells  HPI: Sara [name] is an 58 y.o. female who was diagnosed with seizures in November of last year.  It seems that the patient was started on Keppra. She has had some difficulties tolerating the Keppra and is currently taking 500mg  a day.  She takes 1/2 tablet in the late afternoon and one later at night.  The Keppra does make her lethargic and therefore she does not take it during the day. With this regimen she has done well.  She seems to have no seizures during the day and her seizures at night are only when she does not take the medication.  She is at times reticent to take her medication because she feels that the seizures are a side effect from her other medications that she would like addressed.  She was brought in tonight due to a starring episode and had another witnessed her e in the ED. Starring episodes are like complex partial and described as patient having a premonitory "funny feeling".  The patient then stares off into space.  Afterward she is confused for short period of time before returning to baseline.    Past Medical History  Diagnosis Date  . Thrombocytosis   . Hypertension   . Diabetes mellitus   . Gout   . Renal artery stenosis 12/13/2010    PTCA and Stent - right  . Syncopal episodes   . PAD (peripheral artery disease)   . Hyperlipemia   . Morbid obesity   . LVH (left ventricular hypertrophy)     12/21/09 Echo -mild asymmetric LVH,EF =>55%  . OSA (obstructive sleep apnea)     Past Surgical History  Procedure Laterality Date  . Breast reduction surgery  1995    S/P Bilateral breast reduction  . Cesarean section  1980, 1986    Times  Two.  . Breast cyst excision      S/P Benign Right Breast Cyst Removal.  . Partial hysterectomy  1994  . Extremity cyst excision  1993    left wrist  . Renal artery stent placement Right     Family History  Problem Relation Age of Onset   . Breast cancer Maternal Aunt     2 maternal aunts had breast cancer.  Marland Kitchen Heart failure Mother   . Diabetes Father   . Diabetes Sister   . Hypertension Sister   . Breast cancer Maternal Grandmother   . Hypertension Sister   . Hypertension Sister     Social History:  reports that she has never smoked. She has never used smokeless tobacco. She reports that she does not drink alcohol or use illicit drugs.  Allergies  Allergen Reactions  . Sulfa Antibiotics Itching    Medications: I have reviewed the patient's current medications. Prior to Admission:  No current facility-administered medications for this encounter. Current outpatient prescriptions:aspirin EC 81 MG tablet, Take 81 mg by mouth daily., Disp: , Rfl: ;  atorvastatin (LIPITOR) 10 MG tablet, Take 10 mg by mouth every evening. , Disp: , Rfl: ;  Cholecalciferol (VITAMIN D3) 2000 UNITS capsule, Take 2,000 Units by mouth daily., Disp: , Rfl: ;  hydroxyurea (HYDREA) 500 MG capsule, Take 500 mg by mouth daily. May take with food to minimize GI side effects., Disp: , Rfl:  levETIRAcetam (KEPPRA) 500 MG tablet, Take 500 mg by mouth daily. Patient states she is suppose to take it twice a day however she only takes one tablet  a day because if she takes two pills it makes her tired per patient, Disp: , Rfl: ;  nebivolol (BYSTOLIC) 5 MG tablet, Take 5 mg by mouth daily., Disp: , Rfl:  STUDY MEDICATION, Take 100 mg by mouth daily. Research study with Dr. Baird Cancer - pt may be receiving placebo or allopurinol Protocol [id number] 1 capsule [id number] 1 capsule, Disp: , Rfl: ;  valsartan (DIOVAN) 80 MG tablet, Take 80 mg by mouth every evening., Disp: , Rfl: ;  vitamin C (ASCORBIC ACID) 500 MG tablet, Take 500 mg by mouth every evening. , Disp: , Rfl:   ROS: History obtained from the patient  General ROS: negative for - chills, fatigue, fever, night sweats, weight gain or weight loss Psychological ROS: negative for - behavioral disorder,  hallucinations, memory difficulties, mood swings or suicidal ideation Ophthalmic ROS: negative for - blurry vision, double vision, eye pain or loss of vision ENT ROS: negative for - epistaxis, nasal discharge, oral lesions, sore throat, tinnitus or vertigo Allergy and Immunology ROS: negative for - hives or itchy/watery eyes Hematological and Lymphatic ROS: negative for - bleeding problems, bruising or swollen lymph nodes Endocrine ROS: negative for - galactorrhea, hair pattern changes, polydipsia/polyuria or temperature intolerance Respiratory ROS: negative for - cough, hemoptysis, shortness of breath or wheezing Cardiovascular ROS: negative for - chest pain, dyspnea on exertion, edema or irregular heartbeat Gastrointestinal ROS: negative for - abdominal pain, diarrhea, hematemesis, nausea/vomiting or stool incontinence Genito-Urinary ROS: negative for - dysuria, hematuria, incontinence or urinary frequency/urgency Musculoskeletal ROS: intermittent pain in the left leg Neurological ROS: as noted in HPI Dermatological ROS: negative for rash and skin lesion changes  Physical Examination: Blood pressure 194/71, pulse 72, temperature 98.3 F (36.8 C), temperature source Oral, resp. rate 15, weight 101.634 kg (224 lb 1 oz), SpO2 96.00%.  Neurologic Examination Mental Status: Alert, oriented, thought content appropriate.  Speech fluent without evidence of aphasia.  Able to follow 3 step commands without difficulty. Cranial Nerves: II: Discs flat bilaterally; Visual fields grossly normal, pupils equal, round, reactive to light and accommodation III,IV, VI: ptosis not present, extra-ocular motions intact bilaterally V,VII: smile symmetric, facial light touch sensation normal bilaterally VIII: hearing normal bilaterally IX,X: gag reflex present XI: bilateral shoulder shrug XII: midline tongue extension Motor: Right : Upper extremity   5/5    Left:     Upper extremity   5/5  Lower extremity    5/5     Lower extremity   5/5 Tone and bulk:normal tone throughout; no atrophy noted Sensory: Pinprick and light touch intact throughout, bilaterally Deep Tendon Reflexes: 2+ and symmetric throughout Plantars: Right: downgoing   Left: downgoing Cerebellar: normal finger-to-nose and normal heel-to-shin test Gait: normal gait and station CV: pulses palpable throughout     Laboratory Studies:   Basic Metabolic Panel:  Recent Labs Lab 04/05/14 1729  NA 142  K 3.7  CL 102  CO2 26  GLUCOSE 201*  BUN 19  CREATININE 1.22*  CALCIUM 9.3    Liver Function Tests:  Recent Labs Lab 04/05/14 1729  AST 21  ALT 22  ALKPHOS 79  BILITOT 0.3  PROT 7.3  ALBUMIN 3.7   No results found for this basename: LIPASE, AMYLASE,  in the last 168 hours No results found for this basename: AMMONIA,  in the last 168 hours  CBC:  Recent Labs Lab 04/05/14 1729  WBC 6.1  NEUTROABS 3.8  HGB 12.9  HCT 38.2  MCV 109.5*  PLT 404*  Cardiac Enzymes: No results found for this basename: CKTOTAL, CKMB, CKMBINDEX, TROPONINI,  in the last 168 hours  BNP: No components found with this basename: POCBNP,   CBG: No results found for this basename: GLUCAP,  in the last 168 hours  Microbiology: Results for orders placed during the hospital encounter of 01/20/09  BONE MARROW EXAM     Status: None   Collection Time    01/20/09  9:45 AM      Result Value Ref Range Status   Bone Marrow Exam SEE PATHOLOGY REPORT [id number]   Final  TISSUE HYBRIDIZATION (BONE MARROW)-[facility]     Status: None   Collection Time    01/20/09  9:45 AM      Result Value Ref Range Status   Tissue hybridization (bone marrow)-[facility]     Final   Value: SEE SEPARATE REPORT Performed at [facility]    Coagulation Studies: No results found for this basename: LABPROT, INR,  in the last 72 hours  Urinalysis: No results found for this basename: COLORURINE, APPERANCEUR, LABSPEC, PHURINE, GLUCOSEU, HGBUR, BILIRUBINUR,  KETONESUR, PROTEINUR, UROBILINOGEN, NITRITE, LEUKOCYTESUR,  in the last 168 hours  Lipid Panel:     Component Value Date/Time   CHOL 100 07/21/2013 0220   TRIG 52 07/21/2013 0220   HDL 44 07/21/2013 0220   CHOLHDL 2.3 07/21/2013 0220   VLDL 10 07/21/2013 0220   LDLCALC 46 07/21/2013 0220    HgbA1C:  Lab Results  Component Value Date   HGBA1C 5.9* 07/20/2013    Urine Drug Screen:     Component Value Date/Time   LABOPIA NONE DETECTED 08/24/2013 2356   COCAINSCRNUR NONE DETECTED 08/24/2013 2356   LABBENZ NONE DETECTED 08/24/2013 2356   AMPHETMU NONE DETECTED 08/24/2013 2356   THCU NONE DETECTED 08/24/2013 2356   LABBARB NONE DETECTED 08/24/2013 2356    Alcohol Level: No results found for this basename: ETH,  in the last 168 hours  Other results: EKG: normal sinus rhythm at 85 bpm.  Imaging: No results found.   Assessment/Plan: 58 year old female being followed on an outpatient basis for seizures.  The patient presents this evening after having had a breakthrough seizure.  She is forthcoming about her noncompliance which likely led to her episode this evening.  Discussion was had at length and at this time it does not appear that the patient needs a change in anticonvulsant therapy.  She actually feels that her current side effect profile is tolerable and is not willing to exp[eriemnt to see if she would do better on another regimen.  She actually wishes that the rest of her medications could be addressed and that this could potentially lead to her not requiring seizure medication at all.  I have made her aware that this could not reasonably be performed this evening but would need to be addressed on an outpatient basis the her regular physicians.   Recommendations: 1.  Patient to continue her current AED regimen. 2.  To continue follow up with her current outpatient physicians.    Case discussed with Dr. Mordecai Maes, MD [facility] [phone] 04/10/2014,  10:50 PM

## 2014-04-10 NOTE — ED Notes (Signed)
The pt just took 1/2 her bp pill at 1830 and she can feel  When these episodes occur.  At present she reports that she feel strange and for a period of maybe 45 seconds the pt was not responding the any questions asked and was staring ahead.  No jerking motions .  Her speech was delayed for approx another minute then she returned to normal.  She has seen her doctors and there has been no diagnosis.

## 2014-04-10 NOTE — Discharge Instructions (Signed)

## 2014-04-11 ENCOUNTER — Telehealth: Payer: Self-pay | Admitting: Cardiovascular Disease

## 2014-04-11 ENCOUNTER — Telehealth: Payer: Self-pay

## 2014-04-11 NOTE — ED Provider Notes (Signed)
CSN: [id number]     Arrival date & time 04/10/14  1954 History   First MD Initiated Contact with Patient 04/10/14 2025     Chief Complaint  Patient presents with  . seizure-like symptoms      (Consider location/radiation/quality/duration/timing/severity/associated sxs/prior Treatment) The history is provided by the patient.   patient is a history of absence seizures. She has had episodes of them since November. She's been seen by cardiology and neurology. She is on Keppra. She's not been taking the Keppra as planned. She thinks she has the seizures because of her antihypertensives. Patient has had seizures around a half hour after taking the medicine. She states she does not know why she is on the medicine. She sees Dr. Jannifer Franklin from neurology. Her typical episode is staring and then followed by some confusion. No trauma.  Past Medical History  Diagnosis Date  . Thrombocytosis   . Hypertension   . Diabetes mellitus   . Gout   . Renal artery stenosis 12/13/2010    PTCA and Stent - right  . Syncopal episodes   . PAD (peripheral artery disease)   . Hyperlipemia   . Morbid obesity   . LVH (left ventricular hypertrophy)     12/21/09 Echo -mild asymmetric LVH,EF =>55%  . OSA (obstructive sleep apnea)    Past Surgical History  Procedure Laterality Date  . Breast reduction surgery  1995    S/P Bilateral breast reduction  . Cesarean section  1980, 1986    Times  Two.  . Breast cyst excision      S/P Benign Right Breast Cyst Removal.  . Partial hysterectomy  1994  . Extremity cyst excision  1993    left wrist  . Renal artery stent placement Right    Family History  Problem Relation Age of Onset  . Breast cancer Maternal Aunt     2 maternal aunts had breast cancer.  Marland Kitchen Heart failure Mother   . Diabetes Father   . Diabetes Sister   . Hypertension Sister   . Breast cancer Maternal Grandmother   . Hypertension Sister   . Hypertension Sister    History  Substance Use Topics  .  Smoking status: Never Smoker   . Smokeless tobacco: Never Used  . Alcohol Use: No   OB History   Grav Para Term Preterm Abortions TAB SAB Ect Mult Living                 Review of Systems  Constitutional: Negative for activity change and appetite change.  Eyes: Negative for pain.  Respiratory: Negative for chest tightness and shortness of breath.   Cardiovascular: Negative for chest pain and leg swelling.  Gastrointestinal: Negative for nausea, vomiting, abdominal pain and diarrhea.  Genitourinary: Negative for flank pain.  Musculoskeletal: Negative for back pain and neck stiffness.  Skin: Negative for rash.  Neurological: Positive for seizures and syncope. Negative for weakness, numbness and headaches.  Psychiatric/Behavioral: Negative for behavioral problems.      Allergies  Sulfa antibiotics  Home Medications   Prior to Admission medications   Medication Sig Start Date End Date Taking? Authorizing Brittanny Levenhagen  aspirin EC 81 MG tablet Take 81 mg by mouth daily.   Yes Historical Mischelle Reeg, MD  atorvastatin (LIPITOR) 10 MG tablet Take 10 mg by mouth every evening.    Yes Historical Dotsie Gillette, MD  Cholecalciferol (VITAMIN D3) 2000 UNITS capsule Take 2,000 Units by mouth daily.   Yes Historical Laniqua Torrens, MD  hydroxyurea (HYDREA)  500 MG capsule Take 500 mg by mouth daily. May take with food to minimize GI side effects.   Yes Historical Cotton Beckley, MD  levETIRAcetam (KEPPRA) 500 MG tablet Take 500 mg by mouth daily. Patient states she is suppose to take it twice a day however she only takes one tablet a day because if she takes two pills it makes her tired per patient   Yes Historical Chriss Redel, MD  nebivolol (BYSTOLIC) 5 MG tablet Take 5 mg by mouth daily.   Yes Historical Rashod Gougeon, MD  STUDY MEDICATION Take 100 mg by mouth daily. Research study with Dr. Baird Cancer - pt may be receiving placebo or allopurinol Protocol [id number] 1 capsule [id number] 1 capsule   Yes Historical Amaziah Ghosh,  MD  valsartan (DIOVAN) 80 MG tablet Take 80 mg by mouth every evening.   Yes Historical Carey Lafon, MD  vitamin C (ASCORBIC ACID) 500 MG tablet Take 500 mg by mouth every evening.    Yes Historical Eshika Reckart, MD   BP 127/75  Pulse 75  Temp(Src) 98.3 F (36.8 C) (Oral)  Resp 16  Wt 224 lb 1 oz (101.634 kg)  SpO2 97% Physical Exam  Nursing note and vitals reviewed. Constitutional: She is oriented to person, place, and time. She appears well-developed and well-nourished.  HENT:  Head: Normocephalic and atraumatic.  Eyes: EOM are normal. Pupils are equal, round, and reactive to light.  Neck: Normal range of motion. Neck supple.  Cardiovascular: Normal rate, regular rhythm and normal heart sounds.   No murmur heard. Pulmonary/Chest: Effort normal and breath sounds normal. No respiratory distress. She has no wheezes. She has no rales.  Abdominal: Soft. Bowel sounds are normal. She exhibits no distension. There is no tenderness. There is no rebound and no guarding.  Musculoskeletal: Normal range of motion.  Neurological: She is alert and oriented to person, place, and time. No cranial nerve deficit.  Skin: Skin is warm and dry.  Psychiatric: She has a normal mood and affect. Her speech is normal.    ED Course  Procedures (including critical care time) Labs Review Labs Reviewed - No data to display  Imaging Review No results found.   EKG Interpretation None      MDM   Final diagnoses:  Typical absence seizures    Patient seizures. Witnessed one while was interviewing her. Mild confusion after. This was a typical seizure for her. She has not been taking her Keppra as directed. She's been seen by neurology and be discharged home to follow with her primary neurologist.   Jasper Riling. Alvino Chapel, MD 04/11/14 8115

## 2014-04-11 NOTE — Telephone Encounter (Signed)
Pt called to let us know that she was in hospital last night with a seizure. She is at home. She has a hx of 2 EEGs, 2 MRIs being negative. She believes seizures are related to her medications. This sz started about 1.5 hr after she took atrovastatin. ER would not address the meds because they did not prescribe them. She is requesting her MDs come to a consensus and try to figure out which meds are causing the seizures. The internet says that some of her meds do have sz as a side effect even though the MDs are saying no they don't. She is frustrated and concerned because she is close to losing her job. This note forwarded to Dr Juliann Mule. Dr Jannifer Franklin is her neurologist, Dr Sallyanne Kuster is her cardiologist

## 2014-04-11 NOTE — Telephone Encounter (Signed)
Seen in ER last PM for another seizure.  She is still convinced one of her BP meds is causing the seizure (she thinks Valsartan prescribed by Dr. Baird Cancer).  ER was not willing to change any meds.  Has had several EEGs which she states does not show any reason for the seizures.  Reminded she needs to take the Keppra to prevent the seizures.  States she hates to take this because it makes her extremely sleepy and has difficulty working.  To record BP readings 2 x a day and bring with her 04/18/14 for appt with Cecilie Kicks, NP.  Voiced understanding.

## 2014-04-11 NOTE — Telephone Encounter (Signed)
Pt wanted Dr C that she went to the hospital last night,she had a seizure> She said DR C told her to call if she had to go the hospital,pt feels like it her blood pressure medicine is causing the seizures.

## 2014-04-13 ENCOUNTER — Encounter (HOSPITAL_COMMUNITY): Payer: Self-pay | Admitting: Emergency Medicine

## 2014-04-13 ENCOUNTER — Emergency Department (HOSPITAL_COMMUNITY)
Admission: EM | Admit: 2014-04-13 | Discharge: 2014-04-14 | Disposition: A | Discharge status: Home / Self Care | Payer: BC Managed Care – PPO | Attending: Emergency Medicine | Admitting: Emergency Medicine

## 2014-04-13 DIAGNOSIS — Z8639 Personal history of other endocrine, nutritional and metabolic disease: Secondary | ICD-10-CM | POA: Insufficient documentation

## 2014-04-13 DIAGNOSIS — E119 Type 2 diabetes mellitus without complications: Secondary | ICD-10-CM | POA: Insufficient documentation

## 2014-04-13 DIAGNOSIS — F411 Generalized anxiety disorder: Secondary | ICD-10-CM | POA: Insufficient documentation

## 2014-04-13 DIAGNOSIS — Z862 Personal history of diseases of the blood and blood-forming organs and certain disorders involving the immune mechanism: Secondary | ICD-10-CM | POA: Insufficient documentation

## 2014-04-13 DIAGNOSIS — R4589 Other symptoms and signs involving emotional state: Secondary | ICD-10-CM

## 2014-04-13 DIAGNOSIS — I10 Essential (primary) hypertension: Secondary | ICD-10-CM | POA: Insufficient documentation

## 2014-04-13 DIAGNOSIS — E785 Hyperlipidemia, unspecified: Secondary | ICD-10-CM | POA: Insufficient documentation

## 2014-04-13 DIAGNOSIS — Z79899 Other long term (current) drug therapy: Secondary | ICD-10-CM | POA: Insufficient documentation

## 2014-04-13 DIAGNOSIS — Z7982 Long term (current) use of aspirin: Secondary | ICD-10-CM | POA: Insufficient documentation

## 2014-04-13 LAB — CBG MONITORING, ED: Glucose-Capillary: 109 mg/dL — ABNORMAL HIGH (ref 70–99)

## 2014-04-13 NOTE — ED Notes (Signed)
Patient c/o jittery feeling, and seizures (taking Kepra- skipped tonight's dose as she has not had a seizure today) after starting a new blood pressure medication. Patient states she takes her BP medicine in small pieces as when she takes it whole it it causes her to have a seizure. Patient reports being seen multiple times for this. Patient reports being on multiple medications for multiple ongoing medical issues. Patient is anxious at this time.

## 2014-04-13 NOTE — ED Notes (Signed)
Pt states she has some reactions to Varsatin (generic Diovan). She states she goes into a star/gaze. Her husband at bedside states he witnessed this in times past. She has been on this medication since later part of May.   She is also in a study for gout, she is not sure if she is taking a placebo or allopurinol.   She has concerns with BP fluctuating too high or low.   Tonight she states she wishes to find out her platelet count because she doesn't know why she feels this why. Takes Keppra but omitted does today because she takes it at night and makes her sleepy.

## 2014-04-14 ENCOUNTER — Telehealth: Payer: Self-pay | Admitting: Cardiovascular Disease

## 2014-04-14 LAB — COMPREHENSIVE METABOLIC PANEL
ALBUMIN: 3.5 g/dL (ref 3.5–5.2)
ALT: 22 U/L (ref 0–35)
AST: 17 U/L (ref 0–37)
Alkaline Phosphatase: 84 U/L (ref 39–117)
BILIRUBIN TOTAL: 0.3 mg/dL (ref 0.3–1.2)
BUN: 16 mg/dL (ref 6–23)
CALCIUM: 8.9 mg/dL (ref 8.4–10.5)
CHLORIDE: 100 meq/L (ref 96–112)
CO2: 26 meq/L (ref 19–32)
Creatinine, Ser: 1.15 mg/dL — ABNORMAL HIGH (ref 0.50–1.10)
GFR calc Af Amer: 59 mL/min — ABNORMAL LOW (ref >90)
GFR calc non Af Amer: 51 mL/min — ABNORMAL LOW (ref >90)
Glucose, Bld: 101 mg/dL — ABNORMAL HIGH (ref 70–99)
Potassium: 3.4 mEq/L — ABNORMAL LOW (ref 3.7–5.3)
Sodium: 138 mEq/L (ref 137–147)
Total Protein: 6.9 g/dL (ref 6.0–8.3)

## 2014-04-14 LAB — CBC WITH DIFFERENTIAL/PLATELET
BASOS PCT: 1 % (ref 0–1)
Basophils Absolute: 0 10*3/uL (ref 0.0–0.1)
EOS ABS: 0.3 10*3/uL (ref 0.0–0.7)
Eosinophils Relative: 4 % (ref 0–5)
HCT: 36.8 % (ref 36.0–46.0)
HEMOGLOBIN: 12.8 g/dL (ref 12.0–15.0)
Lymphocytes Relative: 31 % (ref 12–46)
Lymphs Abs: 2 10*3/uL (ref 0.7–4.0)
MCH: 36.6 pg — AB (ref 26.0–34.0)
MCHC: 34.8 g/dL (ref 30.0–36.0)
MCV: 105.1 fL — ABNORMAL HIGH (ref 78.0–100.0)
MONOS PCT: 8 % (ref 3–12)
Monocytes Absolute: 0.5 10*3/uL (ref 0.1–1.0)
NEUTROS PCT: 56 % (ref 43–77)
Neutro Abs: 3.6 10*3/uL (ref 1.7–7.7)
PLATELETS: 393 10*3/uL (ref 150–400)
RBC: 3.5 MIL/uL — ABNORMAL LOW (ref 3.87–5.11)
RDW: 10.8 % — ABNORMAL LOW (ref 11.5–15.5)
WBC: 6.3 10*3/uL (ref 4.0–10.5)

## 2014-04-14 LAB — URINALYSIS, ROUTINE W REFLEX MICROSCOPIC
BILIRUBIN URINE: NEGATIVE
GLUCOSE, UA: NEGATIVE mg/dL
HGB URINE DIPSTICK: NEGATIVE
Ketones, ur: NEGATIVE mg/dL
Leukocytes, UA: NEGATIVE
Nitrite: NEGATIVE
Protein, ur: NEGATIVE mg/dL
SPECIFIC GRAVITY, URINE: 1.001 — AB (ref 1.005–1.030)
UROBILINOGEN UA: 0.2 mg/dL (ref 0.0–1.0)
pH: 6.5 (ref 5.0–8.0)

## 2014-04-14 NOTE — Telephone Encounter (Signed)
Pt is concerned about her blood pressure medicine,Bystolic and generic Diovan.. They keep dropping her blood pressure,pt had to go the hospital last night.

## 2014-04-14 NOTE — Telephone Encounter (Signed)
LM to continue current meds and be sure and keep appt Wednesday with Cecilie Kicks, NP.

## 2014-04-14 NOTE — ED Provider Notes (Signed)
CSN: [id number]     Arrival date & time 04/13/14  2212 History   First MD Initiated Contact with Patient 04/13/14 2305     Chief Complaint  Patient presents with  . multiple complaints     non specific, patient unable to indicate why she is seeking medical evaluation tonight     (Consider location/radiation/quality/duration/timing/severity/associated sxs/prior Treatment) HPI Patient became anxious this evening after stopping taking her blood pressure medication. She's concerned the blood pressure medication is causing her to have seizures. She has an appointment to see her primary Dr. on Tuesday regarding this. She is currently asymptomatic. She's not had seizures since Thursday. Her seizures are characterized as staring spells with mild confusion afterwards. She is asking to be medically evaluated. She denies any headache, fever, chills, neck pain or stiffness, shortness of breath, chest pain, abdominal pain, nausea, vomiting, diarrhea, urinary symptoms, focal weakness, focal numbness. Past Medical History  Diagnosis Date  . Thrombocytosis   . Hypertension   . Diabetes mellitus   . Gout   . Renal artery stenosis 12/13/2010    PTCA and Stent - right  . Syncopal episodes   . PAD (peripheral artery disease)   . Hyperlipemia   . Morbid obesity   . LVH (left ventricular hypertrophy)     12/21/09 Echo -mild asymmetric LVH,EF =>55%  . OSA (obstructive sleep apnea)    Past Surgical History  Procedure Laterality Date  . Breast reduction surgery  1995    S/P Bilateral breast reduction  . Cesarean section  1980, 1986    Times  Two.  . Breast cyst excision      S/P Benign Right Breast Cyst Removal.  . Partial hysterectomy  1994  . Extremity cyst excision  1993    left wrist  . Renal artery stent placement Right    Family History  Problem Relation Age of Onset  . Breast cancer Maternal Aunt     2 maternal aunts had breast cancer.  Marland Kitchen Heart failure Mother   . Diabetes Father   .  Diabetes Sister   . Hypertension Sister   . Breast cancer Maternal Grandmother   . Hypertension Sister   . Hypertension Sister    History  Substance Use Topics  . Smoking status: Never Smoker   . Smokeless tobacco: Never Used  . Alcohol Use: No   OB History   Grav Para Term Preterm Abortions TAB SAB Ect Mult Living                 Review of Systems  Constitutional: Negative for fever and chills.  Respiratory: Negative for cough and shortness of breath.   Cardiovascular: Negative for chest pain, palpitations and leg swelling.  Gastrointestinal: Negative for nausea, vomiting, abdominal pain, diarrhea and constipation.  Genitourinary: Negative for dysuria, frequency, hematuria and flank pain.  Musculoskeletal: Negative for back pain, myalgias, neck pain and neck stiffness.  Skin: Negative for rash and wound.  Neurological: Negative for dizziness, syncope, weakness, light-headedness, numbness and headaches.  Psychiatric/Behavioral: The patient is nervous/anxious.   All other systems reviewed and are negative.     Allergies  Sulfa antibiotics  Home Medications   Prior to Admission medications   Medication Sig Start Date End Date Taking? Authorizing Provider  aspirin EC 81 MG tablet Take 81 mg by mouth daily.   Yes Historical Provider, MD  atorvastatin (LIPITOR) 10 MG tablet Take 10 mg by mouth every evening.    Yes Historical Provider, MD  Cholecalciferol (  VITAMIN D3) 2000 UNITS capsule Take 2,000 Units by mouth daily.   Yes Historical Provider, MD  hydroxyurea (HYDREA) 500 MG capsule Take 500 mg by mouth daily. May take with food to minimize GI side effects.   Yes Historical Provider, MD  levETIRAcetam (KEPPRA) 500 MG tablet Take 500 mg by mouth daily. Patient states she is suppose to take it twice a day however she only takes one tablet a day because if she takes two pills it makes her tired per patient   Yes Historical Provider, MD  nebivolol (BYSTOLIC) 5 MG tablet Take 5 mg  by mouth daily.   Yes Historical Provider, MD  STUDY MEDICATION Take 100 mg by mouth daily. Research study with Dr. Baird Cancer - pt may be receiving placebo or allopurinol Protocol [id number] 1 capsule [id number] 1 capsule   Yes Historical Provider, MD  valsartan (DIOVAN) 80 MG tablet Take 80 mg by mouth every evening.   Yes Historical Provider, MD  vitamin C (ASCORBIC ACID) 500 MG tablet Take 500 mg by mouth every evening.    Yes Historical Provider, MD   BP 162/81  Pulse 66  Temp(Src) 98.1 F (36.7 C) (Oral)  Resp 18  SpO2 94% Physical Exam  Nursing note and vitals reviewed. Constitutional: She is oriented to person, place, and time. She appears well-developed and well-nourished. No distress.  HENT:  Head: Normocephalic and atraumatic.  Mouth/Throat: Oropharynx is clear and moist.  Eyes: EOM are normal. Pupils are equal, round, and reactive to light.  Neck: Normal range of motion. Neck supple.  Cardiovascular: Normal rate and regular rhythm.   Pulmonary/Chest: Effort normal and breath sounds normal. No respiratory distress. She has no wheezes. She has no rales.  Abdominal: Soft. Bowel sounds are normal.  Musculoskeletal: Normal range of motion. She exhibits no edema and no tenderness.  Neurological: She is alert and oriented to person, place, and time.  Patient is alert and oriented x3 with clear, goal oriented speech. Patient has 5/5 motor in all extremities. Sensation is intact to light touch. Patient has a normal gait and walks without assistance.   Skin: Skin is warm and dry. No rash noted. No erythema.  Psychiatric: She has a normal mood and affect. Her behavior is normal.    ED Course  Procedures (including critical care time) Labs Review Labs Reviewed  CBG MONITORING, ED - Abnormal; Notable for the following:    Glucose-Capillary 109 (*)    All other components within normal limits  CBC WITH DIFFERENTIAL  COMPREHENSIVE METABOLIC PANEL  URINALYSIS, ROUTINE W REFLEX  MICROSCOPIC    Imaging Review No results found.   EKG Interpretation None      MDM   Final diagnoses:  None    Labs within normal limits. Patient reassured. Followup with her primary MD on Tuesday. Return precautions given.    Julianne Rice, MD 04/14/14 [phone]

## 2014-04-16 ENCOUNTER — Ambulatory Visit: Payer: BC Managed Care – PPO | Admitting: Cardiology

## 2014-04-17 ENCOUNTER — Telehealth: Payer: Self-pay | Admitting: Cardiovascular Disease

## 2014-04-17 NOTE — Telephone Encounter (Signed)
Pt would like to know if you have any samples of Bystolic 5 mg?

## 2014-04-18 ENCOUNTER — Telehealth: Payer: Self-pay

## 2014-04-18 NOTE — Telephone Encounter (Signed)
S/w pt that Dr Juliann Mule wants her to alternate 1 tab hydrea with 2 tab hydrea daily. We will check labs in 3 weeks. Pt spoke back the schedule.

## 2014-04-18 NOTE — Telephone Encounter (Signed)
Spoke with patient let her know I had some samples for her Bystolic but questioned why she had canceled her appointment with Mickel Baas. She had a full physical Tuesday with Dr.Sanders and she directed the patient to cancel the appointment with [place] and made some medication changes. Mickel Baas said samples were fine to continue the same dose and if Dr.Sanders felt it was okay to cancle the appointment that is fine. Patient voiced understanding.

## 2014-04-21 ENCOUNTER — Telehealth: Payer: Self-pay | Admitting: Internal Medicine

## 2014-04-21 NOTE — Telephone Encounter (Signed)
s.w. pt and advised on July appt....pt wanted 7.9..done...pt ok and aware of d.t °

## 2014-04-22 ENCOUNTER — Encounter: Payer: Self-pay | Admitting: Adult Health

## 2014-04-22 ENCOUNTER — Ambulatory Visit (INDEPENDENT_AMBULATORY_CARE_PROVIDER_SITE_OTHER): Payer: BC Managed Care – PPO | Admitting: Adult Health

## 2014-04-22 VITALS — BP 134/82 | HR 72 | Ht 62.0 in | Wt 219.0 lb

## 2014-04-22 DIAGNOSIS — G40A09 Absence epileptic syndrome, not intractable, without status epilepticus: Secondary | ICD-10-CM

## 2014-04-22 DIAGNOSIS — G40309 Generalized idiopathic epilepsy and epileptic syndromes, not intractable, without status epilepticus: Secondary | ICD-10-CM

## 2014-04-22 MED ORDER — LEVETIRACETAM 500 MG PO TABS: 500.0000 mg | ORAL_TABLET | Freq: Every day | ORAL | Status: DC

## 2014-04-22 NOTE — Patient Instructions (Signed)
Seizure, Adult A seizure means there is unusual activity in the brain. A seizure can cause changes in attention or behavior. Seizures often cause shaking (convulsions). Seizures often last from 30 seconds to 2 minutes. HOME CARE   If you are given medicines, take them exactly as told by your doctor.  Keep all doctor visits as told.  Do not swim or drive until your doctor says it is okay.  Teach others what to do if you have a seizure. They should:  Lay you on the ground.  Put a cushion under your head.  Loosen any tight clothing around your neck.  Turn you on your side.  Stay with you until you get better. GET HELP RIGHT AWAY IF:   The seizure lasts longer than 2 to 5 minutes.  The seizure is very bad.  The person does not wake up after the seizure.  The person's attention or behavior changes. Drive the person to the emergency room or call your local emergency services (911 in [place] MAKE SURE YOU:   Understand these instructions.  Will watch your condition.  Will get help right away if you are not doing well or get worse. Document Released: 03/28/2008 Document Revised: 01/02/2012 Document Reviewed: 09/28/2011 [employer] Patient Information 2015 [employer]. This information is not intended to replace advice given to you by your health care Donnovan Stamour. Make sure you discuss any questions you have with your health care Bridgid Printz.  

## 2014-04-22 NOTE — Progress Notes (Signed)
PATIENT: Sara [name] DOB: 1956-05-05  REASON FOR VISIT: follow up HISTORY FROM: patient  HISTORY OF PRESENT ILLNESS: Sara [name] is a 58 year old female with a history of seizures. She was recently seen in our office on 03/28/2014. Since then she was taken to the emergency room because she felt "funny" but reports she did not have a seizure. The patient was taking Keppra half a tablet daily. This was not the prescribed dosage. The patient should be taken  half a tablet twice a day for 2 weeks then increasing to one tablet twice a day. Patient states that she can't take 1 whole tablet because it makes her sleepy so she only takes 1/2 tablet at night. Patient states that her PCP and hematologist have changed her BP medication and reduced her hydrea. She reports she has not had a seizure since starting Keppra.  Her husband agrees with that.   REVIEW OF SYSTEMS: Full 14 system review of systems performed and notable only for:  Constitutional: N/A  Eyes: N/A Ear/Nose/Throat: N/A  Skin: N/A  Cardiovascular: N/A  Respiratory: N/A  Gastrointestinal: N/A  Genitourinary: N/A Hematology/Lymphatic: N/A  Endocrine: N/A Musculoskeletal:N/A  Allergy/Immunology: N/A  Neurological: N/A Psychiatric: N/A Sleep: N/A   ALLERGIES: Allergies  Allergen Reactions  . Sulfa Antibiotics Itching    HOME MEDICATIONS: Outpatient Prescriptions Prior to Visit  Medication Sig Dispense Refill  . aspirin EC 81 MG tablet Take 81 mg by mouth daily.      Marland Kitchen atorvastatin (LIPITOR) 10 MG tablet Take 10 mg by mouth every evening.       . Cholecalciferol (VITAMIN D3) 2000 UNITS capsule Take 2,000 Units by mouth daily.      . hydroxyurea (HYDREA) 500 MG capsule Take 500 mg by mouth daily. May take with food to minimize GI side effects.      . levETIRAcetam (KEPPRA) 500 MG tablet Take 500 mg by mouth daily. Patient states she is suppose to take it twice a day however she only takes one tablet a day because if  she takes two pills it makes her tired per patient      . nebivolol (BYSTOLIC) 5 MG tablet Take 5 mg by mouth daily.      . STUDY MEDICATION Take 100 mg by mouth daily. Research study with Dr. Baird Cancer - pt may be receiving placebo or allopurinol Protocol [id number] 1 capsule [id number] 1 capsule      . vitamin C (ASCORBIC ACID) 500 MG tablet Take 500 mg by mouth every evening.       . valsartan (DIOVAN) 80 MG tablet Take 80 mg by mouth every evening.       No facility-administered medications prior to visit.    PAST MEDICAL HISTORY: Past Medical History  Diagnosis Date  . Thrombocytosis   . Hypertension   . Diabetes mellitus   . Gout   . Renal artery stenosis 12/13/2010    PTCA and Stent - right  . Syncopal episodes   . PAD (peripheral artery disease)   . Hyperlipemia   . Morbid obesity   . LVH (left ventricular hypertrophy)     12/21/09 Echo -mild asymmetric LVH,EF =>55%  . OSA (obstructive sleep apnea)     PAST SURGICAL HISTORY: Past Surgical History  Procedure Laterality Date  . Breast reduction surgery  1995    S/P Bilateral breast reduction  . Cesarean section  1980, 1986    Times  Two.  . Breast cyst excision  S/P Benign Right Breast Cyst Removal.  . Partial hysterectomy  1994  . Extremity cyst excision  1993    left wrist  . Renal artery stent placement Right     FAMILY HISTORY: Family History  Problem Relation Age of Onset  . Breast cancer Maternal Aunt     2 maternal aunts had breast cancer.  Marland Kitchen Heart failure Mother   . Diabetes Father   . Diabetes Sister   . Hypertension Sister   . Breast cancer Maternal Grandmother   . Hypertension Sister   . Hypertension Sister     SOCIAL HISTORY: History   Social History  . Marital Status: Married    Spouse Name: Juanda Crumble    Number of Children: 2  . Years of Education: 16   Occupational History  . [employer].    Social History Main Topics  . Smoking status: Never Smoker   . Smokeless  tobacco: Never Used  . Alcohol Use: No  . Drug Use: No  . Sexual Activity: Yes   Other Topics Concern  . Not on file   Social History Narrative   Patient lives at husband and her son, home with family.   Patient has two adult children.   Patient is not drinking any caffeine.   Patient is working full-time.   Patient has a college education.   Patient is right-handed.      PHYSICAL EXAM  Filed Vitals:   04/22/14 0819  BP: 134/82  Pulse: 72  Height: 5\' 2"  (1.575 m)  Weight: 219 lb (99.338 kg)   Body mass index is 40.05 kg/(m^2).  Generalized: Well developed, in no acute distress   Neurological examination  Mentation: Alert oriented to time, place, history taking. Follows all commands speech and language fluent Cranial nerve II-XII:  Extraocular movements were full, visual field were full on confrontational test.  Motor: The motor testing reveals 5 over 5 strength of all 4 extremities. Good symmetric motor tone is noted throughout.  Sensory: Sensory testing is intact to soft touch on all 4 extremities. No evidence of extinction is noted.  Coordination: Cerebellar testing reveals good finger-nose-finger and heel-to-shin bilaterally.  Gait and station: Gait is normal.  Romberg is negative. No drift is seen.  Reflexes: Deep tendon reflexes are symmetric and normal bilaterally.    DIAGNOSTIC DATA (LABS, IMAGING, TESTING) - I reviewed patient records, labs, notes, testing and imaging myself where available.  Lab Results  Component Value Date   WBC 6.3 04/14/2014   HGB 12.8 04/14/2014   HCT 36.8 04/14/2014   MCV 105.1* 04/14/2014   PLT 393 04/14/2014      Component Value Date/Time   NA 138 04/14/2014 0002   NA 144 02/27/2014 0907   K 3.4* 04/14/2014 0002   K 4.0 02/27/2014 0907   CL 100 04/14/2014 0002   CL 102 03/19/2013 1535   CO2 26 04/14/2014 0002   CO2 26 02/27/2014 0907   GLUCOSE 101* 04/14/2014 0002   GLUCOSE 99 02/27/2014 0907   GLUCOSE 117* 03/19/2013 1535   BUN 16  04/14/2014 0002   BUN 17.6 02/27/2014 0907   CREATININE 1.15* 04/14/2014 0002   CREATININE 1.2* 02/27/2014 0907   CALCIUM 8.9 04/14/2014 0002   CALCIUM 9.8 02/27/2014 0907   PROT 6.9 04/14/2014 0002   PROT 7.2 02/27/2014 0907   ALBUMIN 3.5 04/14/2014 0002   ALBUMIN 3.7 02/27/2014 0907   AST 17 04/14/2014 0002   AST 19 02/27/2014 0907   ALT 22 04/14/2014  0002   ALT 17 02/27/2014 0907   ALKPHOS 84 04/14/2014 0002   ALKPHOS 89 02/27/2014 0907   BILITOT 0.3 04/14/2014 0002   BILITOT 0.39 02/27/2014 0907   GFRNONAA 51* 04/14/2014 0002   GFRAA 59* 04/14/2014 0002   Lab Results  Component Value Date   CHOL 100 07/21/2013   HDL 44 07/21/2013   LDLCALC 46 07/21/2013   TRIG 52 07/21/2013   CHOLHDL 2.3 07/21/2013   Lab Results  Component Value Date   HGBA1C 5.9* 07/20/2013   No results found for this basename: VITAMINB12   Lab Results  Component Value Date   TSH 2.839 07/21/2013      ASSESSMENT AND PLAN 58 y.o. year old female  has a past medical history of Thrombocytosis; Hypertension; Diabetes mellitus; Gout; Renal artery stenosis (12/13/2010); Syncopal episodes; PAD (peripheral artery disease); Hyperlipemia; Morbid obesity; LVH (left ventricular hypertrophy); and OSA (obstructive sleep apnea). here with   1. Seizures  Patient has not had any seizures since starting keppra. She has only been taking 1/2 tablet at night because it makes her drowsy. I encouraged the patient to increase to 1 tablet at night. If the drowsiness becomes an issue we will need to change the medication. If the patient has another seizure she should let us know so we can adjust the dosage of keppra or switch to another medication. Patient feels that changes to her other medications such a valsartan and hydrea have also had a positive effect on her seizure acitivity. Patient should follow up in 4 months or sooner if needed.    Ward Givens, MSN, NP-C 04/22/2014, 8:29 AM [facility] [address], [place], [place] [address] [phone]  Note: This document was prepared with digital dictation and possible smart phrase technology. Any transcriptional errors that result from this process are unintentional.

## 2014-04-22 NOTE — Progress Notes (Signed)
I have read the note, and I agree with the clinical assessment and plan.  WILLIS,CHARLES KEITH   

## 2014-05-07 ENCOUNTER — Other Ambulatory Visit (HOSPITAL_BASED_OUTPATIENT_CLINIC_OR_DEPARTMENT_OTHER): Payer: BC Managed Care – PPO

## 2014-05-07 DIAGNOSIS — I10 Essential (primary) hypertension: Secondary | ICD-10-CM

## 2014-05-07 DIAGNOSIS — D473 Essential (hemorrhagic) thrombocythemia: Secondary | ICD-10-CM

## 2014-05-07 LAB — CBC WITH DIFFERENTIAL/PLATELET
BASO%: 1.1 % (ref 0.0–2.0)
BASOS ABS: 0 10*3/uL (ref 0.0–0.1)
EOS ABS: 0.2 10*3/uL (ref 0.0–0.5)
EOS%: 3.9 % (ref 0.0–7.0)
HEMATOCRIT: 38.7 % (ref 34.8–46.6)
HGB: 12.8 g/dL (ref 11.6–15.9)
LYMPH%: 26.1 % (ref 14.0–49.7)
MCH: 35.8 pg — ABNORMAL HIGH (ref 25.1–34.0)
MCHC: 33.2 g/dL (ref 31.5–36.0)
MCV: 107.7 fL — ABNORMAL HIGH (ref 79.5–101.0)
MONO#: 0.4 10*3/uL (ref 0.1–0.9)
MONO%: 9 % (ref 0.0–14.0)
NEUT%: 59.9 % (ref 38.4–76.8)
NEUTROS ABS: 2.8 10*3/uL (ref 1.5–6.5)
Platelets: 392 10*3/uL (ref 145–400)
RBC: 3.59 10*6/uL — ABNORMAL LOW (ref 3.70–5.45)
RDW: 11.6 % (ref 11.2–14.5)
WBC: 4.7 10*3/uL (ref 3.9–10.3)
lymph#: 1.2 10*3/uL (ref 0.9–3.3)

## 2014-05-07 LAB — BASIC METABOLIC PANEL (CC13)
ANION GAP: 10 meq/L (ref 3–11)
BUN: 14.9 mg/dL (ref 7.0–26.0)
CALCIUM: 9.3 mg/dL (ref 8.4–10.4)
CO2: 27 meq/L (ref 22–29)
CREATININE: 1.1 mg/dL (ref 0.6–1.1)
Chloride: 106 mEq/L (ref 98–109)
Glucose: 96 mg/dl (ref 70–140)
Potassium: 4.1 mEq/L (ref 3.5–5.1)
SODIUM: 142 meq/L (ref 136–145)

## 2014-05-15 ENCOUNTER — Telehealth: Payer: Self-pay | Admitting: Cardiovascular Disease

## 2014-05-15 MED ORDER — NEBIVOLOL HCL 5 MG PO TABS: 5.0000 mg | ORAL_TABLET | Freq: Every day | ORAL | Status: DC

## 2014-05-15 NOTE — Telephone Encounter (Signed)
Pt woud like some samples of Bystolic 5 mg please.

## 2014-05-15 NOTE — Telephone Encounter (Signed)
Samples left at front desk for patient pick-up. Left message with patient's sister.

## 2014-05-15 NOTE — Addendum Note (Signed)
Addended by: Diana Eves on: 05/15/2014 03:07 PM   Modules accepted: Orders

## 2014-05-30 ENCOUNTER — Other Ambulatory Visit (HOSPITAL_BASED_OUTPATIENT_CLINIC_OR_DEPARTMENT_OTHER): Payer: BC Managed Care – PPO

## 2014-05-30 DIAGNOSIS — D473 Essential (hemorrhagic) thrombocythemia: Secondary | ICD-10-CM

## 2014-05-30 LAB — CBC WITH DIFFERENTIAL/PLATELET
BASO%: 1.3 % (ref 0.0–2.0)
Basophils Absolute: 0.1 10*3/uL (ref 0.0–0.1)
EOS%: 4 % (ref 0.0–7.0)
Eosinophils Absolute: 0.2 10*3/uL (ref 0.0–0.5)
HCT: 39.6 % (ref 34.8–46.6)
HEMOGLOBIN: 13.2 g/dL (ref 11.6–15.9)
LYMPH#: 1.5 10*3/uL (ref 0.9–3.3)
LYMPH%: 34 % (ref 14.0–49.7)
MCH: 35.4 pg — AB (ref 25.1–34.0)
MCHC: 33.2 g/dL (ref 31.5–36.0)
MCV: 106.4 fL — AB (ref 79.5–101.0)
MONO#: 0.4 10*3/uL (ref 0.1–0.9)
MONO%: 8.4 % (ref 0.0–14.0)
NEUT#: 2.3 10*3/uL (ref 1.5–6.5)
NEUT%: 52.3 % (ref 38.4–76.8)
Platelets: 367 10*3/uL (ref 145–400)
RBC: 3.72 10*6/uL (ref 3.70–5.45)
RDW: 12.1 % (ref 11.2–14.5)
WBC: 4.5 10*3/uL (ref 3.9–10.3)

## 2014-06-17 ENCOUNTER — Other Ambulatory Visit: Payer: Self-pay | Admitting: *Deleted

## 2014-06-17 DIAGNOSIS — D473 Essential (hemorrhagic) thrombocythemia: Secondary | ICD-10-CM

## 2014-06-17 MED ORDER — HYDROXYUREA 500 MG PO CAPS: ORAL_CAPSULE | ORAL | Status: DC

## 2014-07-15 ENCOUNTER — Emergency Department (HOSPITAL_COMMUNITY)
Admission: EM | Admit: 2014-07-15 | Discharge: 2014-07-15 | Disposition: A | Discharge status: Home / Self Care | Payer: BC Managed Care – PPO | Attending: Emergency Medicine | Admitting: Emergency Medicine

## 2014-07-15 ENCOUNTER — Encounter (HOSPITAL_COMMUNITY): Payer: Self-pay | Admitting: Emergency Medicine

## 2014-07-15 DIAGNOSIS — E119 Type 2 diabetes mellitus without complications: Secondary | ICD-10-CM | POA: Diagnosis not present

## 2014-07-15 DIAGNOSIS — Z79899 Other long term (current) drug therapy: Secondary | ICD-10-CM | POA: Insufficient documentation

## 2014-07-15 DIAGNOSIS — I10 Essential (primary) hypertension: Secondary | ICD-10-CM | POA: Insufficient documentation

## 2014-07-15 DIAGNOSIS — Z862 Personal history of diseases of the blood and blood-forming organs and certain disorders involving the immune mechanism: Secondary | ICD-10-CM | POA: Diagnosis not present

## 2014-07-15 DIAGNOSIS — G40309 Generalized idiopathic epilepsy and epileptic syndromes, not intractable, without status epilepticus: Secondary | ICD-10-CM | POA: Insufficient documentation

## 2014-07-15 DIAGNOSIS — F411 Generalized anxiety disorder: Secondary | ICD-10-CM | POA: Insufficient documentation

## 2014-07-15 DIAGNOSIS — E785 Hyperlipidemia, unspecified: Secondary | ICD-10-CM | POA: Insufficient documentation

## 2014-07-15 DIAGNOSIS — Z7982 Long term (current) use of aspirin: Secondary | ICD-10-CM | POA: Diagnosis not present

## 2014-07-15 DIAGNOSIS — G40A09 Absence epileptic syndrome, not intractable, without status epilepticus: Secondary | ICD-10-CM

## 2014-07-15 MED ORDER — LACOSAMIDE 50 MG PO TABS: ORAL_TABLET | ORAL | Status: DC

## 2014-07-15 MED ORDER — LACOSAMIDE 50 MG PO TABS
50.0000 mg | ORAL_TABLET | Freq: Two times a day (BID) | ORAL | Status: DC
  Administered 2014-07-15: 50 mg via ORAL
  Filled 2014-07-15: qty 1

## 2014-07-15 NOTE — Discharge Instructions (Signed)
Stop the Keppra in one week. At that time he should increase the [name] to 2 pills twice a day.   Seizure, Adult A seizure is abnormal electrical activity in the brain. Seizures usually last from 30 seconds to 2 minutes. There are various types of seizures. Before a seizure, you may have a warning sensation (aura) that a seizure is about to occur. An aura may include the following symptoms:   Fear or anxiety.  Nausea.  Feeling like the room is spinning (vertigo).  Vision changes, such as seeing flashing lights or spots. Common symptoms during a seizure include:  A change in attention or behavior (altered mental status).  Convulsions with rhythmic jerking movements.  Drooling.  Rapid eye movements.  Grunting.  Loss of bladder and bowel control.  Bitter taste in the mouth.  Tongue biting. After a seizure, you may feel confused and sleepy. You may also have an injury resulting from convulsions during the seizure. HOME CARE INSTRUCTIONS   If you are given medicines, take them exactly as prescribed by your health care provider.  Keep all follow-up appointments as directed by your health care provider.  Do not swim or drive or engage in risky activity during which a seizure could cause further injury to you or others until your health care provider says it is OK.  Get adequate rest.  Teach friends and family what to do if you have a seizure. They should:  Lay you on the ground to prevent a fall.  Put a cushion under your head.  Loosen any tight clothing around your neck.  Turn you on your side. If vomiting occurs, this helps keep your airway clear.  Stay with you until you recover.  Know whether or not you need emergency care. SEEK IMMEDIATE MEDICAL CARE IF:  The seizure lasts longer than 5 minutes.  The seizure is severe or you do not wake up immediately after the seizure.  You have an altered mental status after the seizure.  You are having more frequent or  worsening seizures. Someone should drive you to the emergency department or call local emergency services (911 in [place] MAKE SURE YOU:  Understand these instructions.  Will watch your condition.  Will get help right away if you are not doing well or get worse. Document Released: 10/07/2000 Document Revised: 07/31/2013 Document Reviewed: 05/22/2013 [facility] Patient Information 2015 [place], [place]. This information is not intended to replace advice given to you by your health care provider. Make sure you discuss any questions you have with your health care provider.

## 2014-07-15 NOTE — ED Notes (Signed)
Per EMS pt was in the store and began having a funny feeling. Staff from store state they found patient on the floor.  Pt was postictal upon EMS arrival. Pt bite left side of tongue. Pt is currently alert and oriented. Pt states she does take Keppra every night seizures.

## 2014-07-15 NOTE — ED Notes (Addendum)
Pt states she took a lot of Vit C today she states whenever she takes to much Vit C a seizure occurs. Pt takes chemo drug every other day due to platelets.

## 2014-07-15 NOTE — ED Provider Notes (Signed)
CSN: [id number]     Arrival date & time 07/15/14  1917 History   First MD Initiated Contact with Patient 07/15/14 1959     Chief Complaint  Patient presents with  . Seizures     (Consider location/radiation/quality/duration/timing/severity/associated sxs/prior Treatment) Patient is a 58 y.o. female presenting with seizures. The history is provided by the patient.  Seizures Seizure activity on arrival: no    patient with syncope. History of same. Has typical absence seizures. No tonic-clonic activity. Had another episode today while shopping. His been eating less. States she is sleeping well. She's on 500 mg of Keppra. States she is only on this for this anymore makes her too sleepy. She states she takes it at bedtime. No chest pain. No Abdominal pain. She is on other medications that she thinks could also cause seizures. No headache. No current confusion.  Past Medical History  Diagnosis Date  . Thrombocytosis   . Hypertension   . Diabetes mellitus   . Gout   . Renal artery stenosis 12/13/2010    PTCA and Stent - right  . Syncopal episodes   . PAD (peripheral artery disease)   . Hyperlipemia   . Morbid obesity   . LVH (left ventricular hypertrophy)     12/21/09 Echo -mild asymmetric LVH,EF =>55%  . OSA (obstructive sleep apnea)    Past Surgical History  Procedure Laterality Date  . Breast reduction surgery  1995    S/P Bilateral breast reduction  . Cesarean section  1980, 1986    Times  Two.  . Breast cyst excision      S/P Benign Right Breast Cyst Removal.  . Partial hysterectomy  1994  . Extremity cyst excision  1993    left wrist  . Renal artery stent placement Right    Family History  Problem Relation Age of Onset  . Breast cancer Maternal Aunt     2 maternal aunts had breast cancer.  Marland Kitchen Heart failure Mother   . Diabetes Father   . Diabetes Sister   . Hypertension Sister   . Breast cancer Maternal Grandmother   . Hypertension Sister   . Hypertension Sister     History  Substance Use Topics  . Smoking status: Never Smoker   . Smokeless tobacco: Never Used  . Alcohol Use: No   OB History   Grav Para Term Preterm Abortions TAB SAB Ect Mult Living                 Review of Systems  Constitutional: Negative for activity change and appetite change.  Eyes: Negative for pain.  Respiratory: Negative for chest tightness and shortness of breath.   Cardiovascular: Negative for chest pain and leg swelling.  Gastrointestinal: Negative for nausea, vomiting, abdominal pain and diarrhea.  Genitourinary: Negative for flank pain.  Musculoskeletal: Negative for back pain and neck stiffness.  Skin: Negative for rash.  Neurological: Positive for seizures. Negative for weakness, numbness and headaches.  Psychiatric/Behavioral: Positive for confusion. Negative for behavioral problems.      Allergies  Sulfa antibiotics  Home Medications   Prior to Admission medications   Medication Sig Start Date End Date Taking? Authorizing Provider  aspirin EC 81 MG tablet Take 81 mg by mouth daily.   Yes Historical Provider, MD  atorvastatin (LIPITOR) 10 MG tablet Take 10 mg by mouth every evening.    Yes Historical Provider, MD  B Complex Vitamins (B COMPLEX PO) Take 1 tablet by mouth daily.  Yes Historical Provider, MD  Cholecalciferol (VITAMIN D3) 2000 UNITS capsule Take 1,000 Units by mouth daily.    Yes Historical Provider, MD  hydroxyurea (HYDREA) 500 MG capsule TAKE ONE CAPSULE ALTERNATE TWO CAPSULES DAILY OR AS DIRECTED BY PHYSICIAN. 06/17/14  Yes Concha Norway, MD  levETIRAcetam (KEPPRA) 500 MG tablet Take 1 tablet (500 mg total) by mouth daily. Take 1 tablet daily 04/22/14  Yes Ward Givens, NP  losartan (COZAAR) 25 MG tablet Take 25 mg by mouth daily.   Yes Historical Provider, MD  nebivolol (BYSTOLIC) 5 MG tablet Take 1 tablet (5 mg total) by mouth daily. 05/15/14  Yes Mihai Croitoru, MD  STUDY MEDICATION Take 100 mg by mouth daily. Research study with  Dr. Baird Cancer - pt may be receiving placebo or allopurinol Protocol [id number] 1 capsule [id number] 1 capsule   Yes Historical Provider, MD  vitamin C (ASCORBIC ACID) 500 MG tablet Take 500 mg by mouth every evening.    Yes Historical Provider, MD   Pulse 75  Temp(Src) 97.6 F (36.4 C) (Oral)  Resp 18  SpO2 94% Physical Exam  Nursing note and vitals reviewed. Constitutional: She is oriented to person, place, and time. She appears well-developed and well-nourished.  HENT:  Head: Normocephalic and atraumatic.  Eyes: EOM are normal. Pupils are equal, round, and reactive to light.  Neck: Normal range of motion. Neck supple.  Cardiovascular: Normal rate, regular rhythm and normal heart sounds.   No murmur heard. Pulmonary/Chest: Effort normal and breath sounds normal. No respiratory distress. She has no wheezes. She has no rales.  Abdominal: Soft. Bowel sounds are normal. She exhibits no distension. There is no tenderness. There is no rebound and no guarding.  Musculoskeletal: Normal range of motion.  Neurological: She is alert and oriented to person, place, and time. No cranial nerve deficit.  Skin: Skin is warm and dry.  Psychiatric: Her speech is normal.  Patient is somewhat anxious    ED Course  Procedures (including critical care time) Labs Review Labs Reviewed - No data to display  Imaging Review No results found.   EKG Interpretation None      MDM   Final diagnoses:  None    Patient with history of absence seizures. Had one today while on Keppra. She is only 500 mg a day because that is all she can handle. Reviewed Notes from previous neurologist. Discussed with neurologist blister will start him. We'll increase from 50 mg twice a day 200 mg twice a day and at that change will stop Keppra. Will follow with her neurologist  Jasper Riling. Alvino Chapel, MD 07/16/14 7902

## 2014-07-15 NOTE — ED Notes (Signed)
Bed: WA07 Expected date:  Expected time:  Means of arrival:  Comments: EMS seizure 

## 2014-07-16 ENCOUNTER — Telehealth: Payer: Self-pay | Admitting: Adult Health

## 2014-07-16 ENCOUNTER — Ambulatory Visit (INDEPENDENT_AMBULATORY_CARE_PROVIDER_SITE_OTHER): Payer: BC Managed Care – PPO | Admitting: Adult Health

## 2014-07-16 ENCOUNTER — Encounter: Payer: Self-pay | Admitting: Adult Health

## 2014-07-16 VITALS — BP 142/75 | HR 80 | Ht 62.0 in | Wt 213.0 lb

## 2014-07-16 DIAGNOSIS — G40309 Generalized idiopathic epilepsy and epileptic syndromes, not intractable, without status epilepticus: Secondary | ICD-10-CM

## 2014-07-16 DIAGNOSIS — G40A09 Absence epileptic syndrome, not intractable, without status epilepticus: Secondary | ICD-10-CM

## 2014-07-16 NOTE — Telephone Encounter (Signed)
Patient returned my call, appointment is scheduled for today at 2:30 pm with NP MM.

## 2014-07-16 NOTE — Progress Notes (Signed)
PATIENT: [name] DOB: [birth year]  REASON FOR VISIT: follow up HISTORY FROM: patient  HISTORY OF PRESENT ILLNESS: Ms. Hunkele is a 58 year old female with a history of seizures. She was taken to the hospital on 9/22 for a seizure. She states that she collapsed while shopping. She states that the night before she had a staring spell that lasted less than one minute. She thought it was due to not eating much. The next afternoon her and her husband went to [employer] to shop. She states that she started feeling funny and apparently collapsed. The staff at the store called EMS. Her blood sugar and BP was normal. Her husband was not with her when this happened. She is currently taking Keppra 500 mg 1/2 tablet at night. She can't take higher doses of Keppra due to drowsiness. The hospital physician started [name]. She is not operating a motor vehicle. She returns today for evaluation.   HISTORY 04/22/14: 58 year old female with a history of seizures. She was recently seen in our office on 03/28/2014. Since then she was taken to the emergency room because she felt "funny" but reports she did not have a seizure. The patient was taking Keppra half a tablet daily. This was not the prescribed dosage. The patient should be taken half a tablet twice a day for 2 weeks then increasing to one tablet twice a day. Patient states that she can't take 1 whole tablet because it makes her sleepy so she only takes 1/2 tablet at night. Patient states that her PCP and hematologist have changed her BP medication and reduced her hydrea. She reports she has not had a seizure since starting Keppra. Her husband agrees with that.    REVIEW OF SYSTEMS: Full 14 system review of systems performed and notable only for:  Constitutional: N/A  Eyes: N/A Ear/Nose/Throat: N/A  Skin: N/A  Cardiovascular: N/A  Respiratory: N/A  Gastrointestinal: N/A  Genitourinary: N/A Hematology/Lymphatic: N/A  Endocrine:  N/A Musculoskeletal:N/A  Allergy/Immunology: N/A  Neurological: Seizure, passing out Psychiatric: N/A Sleep: N/A   ALLERGIES: Allergies  Allergen Reactions  . Sulfa Antibiotics Itching    HOME MEDICATIONS: Outpatient Prescriptions Prior to Visit  Medication Sig Dispense Refill  . aspirin EC 81 MG tablet Take 81 mg by mouth daily.      Marland Kitchen atorvastatin (LIPITOR) 10 MG tablet Take 10 mg by mouth every evening.       . B Complex Vitamins (B COMPLEX PO) Take 1 tablet by mouth daily.       . Cholecalciferol (VITAMIN D3) 2000 UNITS capsule Take 1,000 Units by mouth daily.       . hydroxyurea (HYDREA) 500 MG capsule TAKE ONE CAPSULE ALTERNATE TWO CAPSULES DAILY OR AS DIRECTED BY PHYSICIAN.  120 capsule  0  . lacosamide ([name]) 50 MG TABS tablet 1 tablet po bid for 1 week. Then 2 tabs po bid.  60 tablet  0  . losartan (COZAAR) 25 MG tablet Take 25 mg by mouth daily.      . nebivolol (BYSTOLIC) 5 MG tablet Take 1 tablet (5 mg total) by mouth daily.  35 tablet  0  . STUDY MEDICATION Take 100 mg by mouth daily. Research study with Dr. Baird Cancer - pt may be receiving placebo or allopurinol Protocol [id number] 1 capsule [id number] 1 capsule      . vitamin C (ASCORBIC ACID) 500 MG tablet Take 500 mg by mouth every evening.  No facility-administered medications prior to visit.    PAST MEDICAL HISTORY: Past Medical History  Diagnosis Date  . Thrombocytosis   . Hypertension   . Diabetes mellitus   . Gout   . Renal artery stenosis 12/13/2010    PTCA and Stent - right  . Syncopal episodes   . PAD (peripheral artery disease)   . Hyperlipemia   . Morbid obesity   . LVH (left ventricular hypertrophy)     12/21/09 Echo -mild asymmetric LVH,EF =>55%  . OSA (obstructive sleep apnea)     PAST SURGICAL HISTORY: Past Surgical History  Procedure Laterality Date  . Breast reduction surgery  1995    S/P Bilateral breast reduction  . Cesarean section  1980, 1986    Times  Two.  .  Breast cyst excision      S/P Benign Right Breast Cyst Removal.  . Partial hysterectomy  1994  . Extremity cyst excision  1993    left wrist  . Renal artery stent placement Right     FAMILY HISTORY: Family History  Problem Relation Age of Onset  . Breast cancer Maternal Aunt     2 maternal aunts had breast cancer.  Marland Kitchen Heart failure Mother   . Diabetes Father   . Diabetes Sister   . Hypertension Sister   . Breast cancer Maternal Grandmother   . Hypertension Sister   . Hypertension Sister     SOCIAL HISTORY: History   Social History  . Marital Status: Married    Spouse Name: Juanda Crumble    Number of Children: 2  . Years of Education: 16   Occupational History  . [employer].    Social History Main Topics  . Smoking status: Never Smoker   . Smokeless tobacco: Never Used  . Alcohol Use: No  . Drug Use: No  . Sexual Activity: Yes   Other Topics Concern  . Not on file   Social History Narrative   Patient lives at husband and her son, home with family.   Patient has two adult children.   Patient is not drinking any caffeine.   Patient is working full-time.   Patient has a college education.   Patient is right-handed.      PHYSICAL EXAM  Filed Vitals:   07/16/14 1419  BP: 142/75  Pulse: 80  Height: 5\' 2"  (1.575 m)  Weight: 213 lb (96.616 kg)   Body mass index is 38.95 kg/(m^2).  Generalized: Well developed, in no acute distress   Neurological examination  Mentation: Alert oriented to time, place, history taking. Follows all commands speech and language fluent Cranial nerve II-XII: Pupils were equal round reactive to light. Extraocular movements were full, visual field were full on confrontational test. Facial sensation and strength were normal.  Uvula tongue midline. Head turning and shoulder shrug  were normal and symmetric. Motor: The motor testing reveals 5 over 5 strength of all 4 extremities. Good symmetric motor tone is noted throughout.   Sensory: Sensory testing is intact to soft touch on all 4 extremities. No evidence of extinction is noted.  Coordination: Cerebellar testing reveals good finger-nose-finger and heel-to-shin bilaterally.  Gait and station: Gait is normal. Tandem gait is normal. Romberg is negative. No drift is seen.  Reflexes: Deep tendon reflexes are symmetric but depressed.   DIAGNOSTIC DATA (LABS, IMAGING, TESTING) - I reviewed patient records, labs, notes, testing and imaging myself where available.  Lab Results  Component Value Date   WBC 4.5 05/30/2014  HGB 13.2 05/30/2014   HCT 39.6 05/30/2014   MCV 106.4* 05/30/2014   PLT 367 05/30/2014      Component Value Date/Time   NA 142 05/07/2014 0812   NA 138 04/14/2014 0002   K 4.1 05/07/2014 0812   K 3.4* 04/14/2014 0002   CL 100 04/14/2014 0002   CL 102 03/19/2013 1535   CO2 27 05/07/2014 0812   CO2 26 04/14/2014 0002   GLUCOSE 96 05/07/2014 0812   GLUCOSE 101* 04/14/2014 0002   GLUCOSE 117* 03/19/2013 1535   BUN 14.9 05/07/2014 0812   BUN 16 04/14/2014 0002   CREATININE 1.1 05/07/2014 0812   CREATININE 1.15* 04/14/2014 0002   CALCIUM 9.3 05/07/2014 0812   CALCIUM 8.9 04/14/2014 0002   PROT 6.9 04/14/2014 0002   PROT 7.2 02/27/2014 0907   ALBUMIN 3.5 04/14/2014 0002   ALBUMIN 3.7 02/27/2014 0907   AST 17 04/14/2014 0002   AST 19 02/27/2014 0907   ALT 22 04/14/2014 0002   ALT 17 02/27/2014 0907   ALKPHOS 84 04/14/2014 0002   ALKPHOS 89 02/27/2014 0907   BILITOT 0.3 04/14/2014 0002   BILITOT 0.39 02/27/2014 0907   GFRNONAA 51* 04/14/2014 0002   GFRAA 59* 04/14/2014 0002   Lab Results  Component Value Date   CHOL 100 07/21/2013   HDL 44 07/21/2013   LDLCALC 46 07/21/2013   TRIG 52 07/21/2013   CHOLHDL 2.3 07/21/2013   Lab Results  Component Value Date   HGBA1C 5.9* 07/20/2013    Lab Results  Component Value Date   TSH 2.839 07/21/2013      ASSESSMENT AND PLAN 58 y.o. year old female  has a past medical history of Thrombocytosis; Hypertension; Diabetes mellitus;  Gout; Renal artery stenosis (12/13/2010); Syncopal episodes; PAD (peripheral artery disease); Hyperlipemia; Morbid obesity; LVH (left ventricular hypertrophy); and OSA (obstructive sleep apnea). here with  1. Seizures  The patient will begin taking [name]. 50 mg 1 tablet BID for 1 week then increase to 2 tablets BID. At the end of two weeks the patient should let me know how she is tolerating the [name] and if she has had any more seizure events. If she is doing well then we can stop the Keppra. She should not operate a motor vehicle until she has been seizure free for 6 months. She verbalized understanding. She should follow-up in 3 months or sooner if needed. At that visit she should bring her CPAP machine as well.   Ward Givens, MSN, NP-C 07/16/2014, 2:18 PM [facility] [address], [place], [place] [address] [phone]  Note: This document was prepared with digital dictation and possible smart phrase technology. Any transcriptional errors that result from this process are unintentional.

## 2014-07-16 NOTE — Telephone Encounter (Signed)
Patient calling to get a sooner appointment than the one already scheduled with Jinny Blossom, states that she was in the hospital last night because of seizures. Please call and schedule.

## 2014-07-16 NOTE — Progress Notes (Signed)
I have read the note, and I agree with the clinical assessment and plan.  Eligio Angert KEITH   

## 2014-07-16 NOTE — Telephone Encounter (Signed)
Called patient did not get an answer, left message to call the office back, Jinny Blossom would like for her to be seen in the office this week.

## 2014-07-16 NOTE — Patient Instructions (Signed)
Start [name] as prescribed by the ED physicain. At then end of two weeks call me so we can discuss how you are doing. If doing well we will stop the Keppra.   Lacosamide tablets What is this medicine? LACOSAMIDE (la KOE sa mide) is used to control seizures caused by certain types of epilepsy. This medicine may be used for other purposes; ask your health care provider or pharmacist if you have questions. COMMON BRAND NAME(S): [name] What should I tell my health care provider before I take this medicine? They need to know if you have any of these conditions: -dehydration -heart disease, including heart failure -history of a drug or alcohol abuse problem -kidney disease -liver disease -suicidal thoughts, plans, or attempt; a previous suicide attempt by you or a family member -an unusual or allergic reaction to lacosamide, other medicines, foods, dyes, or preservatives -pregnant or trying to get pregnant -breast-feeding How should I use this medicine? Take this medicine by mouth with a glass of water. You can take it with or without food. Follow the directions on the prescription label. Take your doses at regular intervals. Do not take your medicine more often than directed. Do not stop taking except on the advice of your doctor or health care professional. A special MedGuide will be given to you by the pharmacist with each prescription and refill. Be sure to read this information carefully each time. Talk to your pediatrician regarding the use of this medicine in children. Special care may be needed. Overdosage: If you think you have taken too much of this medicine contact a poison control center or emergency room at once. NOTE: This medicine is only for you. Do not share this medicine with others. What if I miss a dose? If you miss a dose, take it as soon as you can. If it is almost time for your next dose, take only that dose. Do not take double or extra doses. What may interact with this  medicine? -atazanavir -beta-blockers like metoprolol and propranolol -calcium channel blockers like diltiazem and verapamil -digoxin -dronedarone -lopinavir/ritonavir This list may not describe all possible interactions. Give your health care provider a list of all the medicines, herbs, non-prescription drugs, or dietary supplements you use. Also tell them if you smoke, drink alcohol, or use illegal drugs. Some items may interact with your medicine. What should I watch for while using this medicine? Visit your doctor or health care professional for regular checks on your progress. This medicine needs careful monitoring. Wear a medical ID bracelet or chain, and carry a card that describes your disease and details of your medicine and dosage times. You may get drowsy or dizzy. Do not drive, use machinery, or do anything that needs mental alertness until you know how this medicine affects you. Do not stand or sit up quickly, especially if you are an older patient. This reduces the risk of dizzy or fainting spells. Alcohol may interfere with the effect of this medicine. Avoid alcoholic drinks. The use of this medicine may increase the chance of suicidal thoughts or actions. Pay special attention to how you are responding while on this medicine. Any worsening of mood, or thoughts of suicide or dying should be reported to your health care professional right away. Women who become pregnant while using this medicine may enroll in the [place] Pregnancy Registry by calling [phone]. This registry collects information about the safety of antiepileptic drug use during pregnancy. What side effects may I notice from  receiving this medicine? Side effects that you should report to your doctor or health care professional as soon as possible: -allergic reactions like skin rash, itching or hives, swelling of the face, lips, or tongue -confusion -feeling faint or lightheaded,  falls -fever -irregular heart beat -loss of memory -suicidal thoughts or other mood changes -unusually weak or tired -yellowing of the eyes, skin Side effects that usually do not require medical attention (report to your doctor or health care professional if they continue or are bothersome): -constipation -diarrhea -drowsiness -dry mouth -headache -nausea This list may not describe all possible side effects. Call your doctor for medical advice about side effects. You may report side effects to FDA at 1-800-FDA-1088. Where should I keep my medicine? Keep out of the reach of children. This medicine can be abused. Keep your medicine in a safe place to protect it from theft. Do not share this medicine with anyone. Selling or giving away this medicine is dangerous and against the law. Store at room temperature between 15 and 30 degrees C (59 and 86 degrees F). Throw away any unused medicine after the expiration date. NOTE: This sheet is a summary. It may not cover all possible information. If you have questions about this medicine, talk to your doctor, pharmacist, or health care provider.  2015, Elsevier/Gold Standard. (2010-06-08 21:02:36)

## 2014-07-17 ENCOUNTER — Telehealth: Payer: Self-pay | Admitting: Adult Health

## 2014-07-17 NOTE — Telephone Encounter (Signed)
Patient calling to ask if stress could be a trigger for her seizures, please return call and advise.

## 2014-07-17 NOTE — Telephone Encounter (Signed)
I called the patient. She was wondering if stress could be a trigger for seizures. I explain that it could be. As I explained to her in the office visit. She needs to allow herself time to rest.

## 2014-07-21 ENCOUNTER — Telehealth: Payer: Self-pay

## 2014-07-21 NOTE — Telephone Encounter (Signed)
Returned pt call again. She wants to talk with the MD about her hydrea. She feels worse on the days she takes 2 tabs. She passed out at [employer] the other day. She is concerned the hydrea is causing the siezures and passing out.  Made appt for tomorrow at 0830 POF to scheduler

## 2014-07-21 NOTE — Telephone Encounter (Signed)
Pt called stating she had passed out and wondering if this could be from medications. [id number] returned her call with no answer. LVM.

## 2014-07-22 ENCOUNTER — Telehealth: Payer: Self-pay | Admitting: Hematology

## 2014-07-22 ENCOUNTER — Ambulatory Visit (HOSPITAL_BASED_OUTPATIENT_CLINIC_OR_DEPARTMENT_OTHER): Payer: BC Managed Care – PPO | Admitting: Hematology

## 2014-07-22 ENCOUNTER — Encounter: Payer: Self-pay | Admitting: Hematology

## 2014-07-22 VITALS — BP 146/62 | HR 67 | Temp 98.5°F | Resp 18 | Ht 62.0 in | Wt 213.0 lb

## 2014-07-22 DIAGNOSIS — D473 Essential (hemorrhagic) thrombocythemia: Secondary | ICD-10-CM

## 2014-07-22 NOTE — Telephone Encounter (Signed)
Pt confirmed labs/ov per 09/29 POF, gave pt AVS.... KJ °

## 2014-07-22 NOTE — Progress Notes (Signed)
Progress ONCOLOGY OFFICE PROGRESS NOTE DATE OF VISIT: 07/22/2014  [name], MD [address] [place] [address]  DIAGNOSIS: Essential thrombocythemia - Plan: CBC with Differential  CC: Essential Thrombocytosis  CURRENT THERAPY: Hydroxyurea 1000 mg daily as of 03/19/2013.  Aspirin 81 mg daily.  INTERVAL HISTORY:  Sara [name] 58 y.o. female with history of JAK positive Essential thrombocythemia presents for six month follow-up.  She was last seen by Dr Juliann Mule on 02/27/2014.  She reports that she is doing very well.  She denies easy bruising.  She had colonoscopy by Dr. Ferdinand Lango in 2013 with a few polyps.  She will schedule a follow up with one of the affiliates.  She saw Dr. Baird Cancer last year for her annual exam.  She has stopped plavix based on recommendations from cardiology (stent in the kidney).  She was off diabetes medicine due to frequent "black outs" and her A1C is at 5.7.  She has also had her blood pressure medications reduced. She had her mammogram last year. Patient has noticed some "absence seizures" versus "narcolepsy" episodes and underwent an EEG study which was normal, she was evaluated by a neurologist and was placed on Jasper. She did not have any more episodes since she was placed on anti-seizure therapy. She has been using CPAP for about a year and shows compliance. Patient was questioning whether Hydrea can cause seizures. She was taking 1 g alternating with 500 mg and the days she take 1 g, almost all seizure events that happened on those days. Review of the medical literature does not suggest Hydrea causing seizures on lowering the threshold for seizures. Since her counts are extremely stable, I am willing to leave her on 500 mg dose of Hydrea and repeat a CBC with platelet count in 6 weeks.  MEDICAL HISTORY: Past Medical History  Diagnosis Date  . Thrombocytosis   . Hypertension   . Diabetes mellitus   . Gout   . Renal artery  stenosis 12/13/2010    PTCA and Stent - right  . Syncopal episodes   . PAD (peripheral artery disease)   . Hyperlipemia   . Morbid obesity   . LVH (left ventricular hypertrophy)     12/21/09 Echo -mild asymmetric LVH,EF =>55%  . OSA (obstructive sleep apnea)    INTERIM HISTORY: has Essential thrombocytosis; Hypertension; Type II or unspecified type diabetes mellitus without mention of complication, not stated as uncontrolled; Acute renal failure; History of renal stent; Orthostatic hypotension; Diastolic CHF; Syncope; OSA (obstructive sleep apnea); Hyperlipidemia; Sleep apnea with use of continuous positive airway pressure (CPAP); and Typical absence seizures on her problem list.    ALLERGIES:  is allergic to sulfa antibiotics.  MEDICATIONS: has a current medication list which includes the following prescription(s): aspirin ec, atorvastatin, b complex vitamins, vitamin d3, hydroxyurea, lacosamide, levetiracetam, losartan, nebivolol, STUDY MEDICATION, and vitamin c.  SURGICAL HISTORY:  Past Surgical History  Procedure Laterality Date  . Breast reduction surgery  1995    S/P Bilateral breast reduction  . Cesarean section  1980, 1986    Times  Two.  . Breast cyst excision      S/P Benign Right Breast Cyst Removal.  . Partial hysterectomy  1994  . Extremity cyst excision  1993    left wrist  . Renal artery stent placement Right     REVIEW OF SYSTEMS:   Constitutional: Denies fevers, chills or abnormal weight loss Eyes: Denies blurriness of vision Ears, nose, mouth,  throat, and face: Denies mucositis or sore throat Respiratory: Denies cough, dyspnea or wheezes Cardiovascular: Denies palpitation, chest discomfort or lower extremity swelling Gastrointestinal:  Denies nausea, heartburn ; she has constipation.  Skin: Denies abnormal skin rashes Lymphatics: Denies new lymphadenopathy or easy bruising Neurological:Denies numbness, tingling or new weaknesses Behavioral/Psych: Mood is  stable, no new changes  All other systems were reviewed with the patient and are negative.  PHYSICAL EXAMINATION: ECOG PERFORMANCE STATUS: 0  Blood pressure 146/62, pulse 67, temperature 98.5 F (36.9 C), temperature source Oral, resp. rate 18, height 5\' 2"  (1.575 m), weight 213 lb (96.616 kg).  GENERAL:alert, no distress and comfortable; obese pleasant middle-age woman SKIN: skin color, texture, turgor are normal, no rashes or significant lesions EYES: normal, Conjunctiva are pink and non-injected, sclera clear OROPHARYNX:no exudate, no erythema and lips, buccal mucosa, and tongue normal  NECK: supple, thyroid normal size, non-tender, without nodularity LYMPH:  no palpable lymphadenopathy in the cervical, axillary or inguinal LUNGS: clear to auscultation and percussion with normal breathing effort HEART: regular rate & rhythm and no murmurs and no lower extremity edema ABDOMEN:abdomen soft,  Mildly tender in the epigastric and normal bowel sounds Musculoskeletal:no cyanosis of digits and no clubbing  NEURO: alert & oriented x 3 with fluent speech, no focal motor/sensory deficits   LABORATORY DATA:      RADIOGRAPHIC STUDIES:  DG CHEST 2 VIEW 01/05/2014 CLINICAL DATA: Dizzy. EXAM: CHEST 2 VIEW COMPARISON: DG CHEST 2 VIEW dated 07/20/2013 FINDINGS:  Mediastinum and hilar structures normal. Subsegmental atelectasis left lung base. No pleural effusion or pneumothorax. Heart size  normal. Degenerative changes thoracic spine IMPRESSION: Mild subsegmental atelectasis left lung base, otherwise negative chest.   ASSESSMENT: Essential thrombocythemia. Diagnosed in 2010. JAK2 mutation was present. The patient underwent a bone marrow aspirate and biopsy on 01/20/2009 which yielded a specimen that was suboptimal for evaluation. However, megakaryocytes were abundant with normal  morphology. There was no clustering or excess blasts seen. Platelet count on 01/20/2009 was 881. The patient is being  treated with aspirin and hydroxyurea.  PLAN:  1. Essential Thrombocythemia. --I am reducing her hydrea dose to 500 mg daily and will closely monitor how her CBC trends and platelet count. We will check CBC in 6 weeks given the stability of her plts and her plt trend. The patient had a mammogram on 1027/2014 which was negative for malignancy. Although there is no established association of hydrea causing seizures (few case reports exist), I will be little bit cautious and will try a lower dose of hydrea to see if we can still effectively control her platelets.  2. Follow-up.  We will have her return in about 6 weeks at which time we will check CBC.   --All questions were answered. The patient knows to call the clinic with any problems, questions or concerns. We can certainly see the patient much sooner if necessary.  --I spent 15 minutes counseling the patient face to face. The total time spent in the appointment was 25 minutes.    Bernadene Bell, MD Medical Hematologist/Oncologist [place] Pager: [phone] Office No: [phone]

## 2014-07-23 ENCOUNTER — Telehealth: Payer: Self-pay | Admitting: Hematology

## 2014-07-23 NOTE — Telephone Encounter (Signed)
called pt home and pt was not available.....mailed pt appt sched/letter to pt

## 2014-07-24 ENCOUNTER — Telehealth: Payer: Self-pay | Admitting: Adult Health

## 2014-07-24 NOTE — Telephone Encounter (Signed)
I called the patient and left a voicemail. I explained that suicidal thoughts is a side effect of [name] however it is not common. My suggestion is for her to take the [name] as we prescribed and if she begins to have depression or thoughts of suicide we can certainly discontinue this medication. I advised her that she could call the office if she had any further questions.

## 2014-07-24 NOTE — Telephone Encounter (Signed)
Patient concerned about side effects stated as suicidal thoughts for lacosamide ([name]) 50 MG TABS tablet.  Taking 2 pills a day of [name] and questioning if she could take a lesser dosage?  Cancer doc reduced 1 pill every other day of hydroxyurea (HYDREA) 500 MG capsule    Please call between 7-3:30 at work number, may leave detailed message on voicemail.

## 2014-07-31 ENCOUNTER — Telehealth: Payer: Self-pay | Admitting: Cardiovascular Disease

## 2014-07-31 MED ORDER — NEBIVOLOL HCL 5 MG PO TABS: 5.0000 mg | ORAL_TABLET | Freq: Every day | ORAL | Status: DC

## 2014-07-31 NOTE — Telephone Encounter (Signed)
Pt called in wanting to know if there were any samples of Bystolic 5mg  that she could have. Please call  Thanks

## 2014-07-31 NOTE — Telephone Encounter (Signed)
Left VM that samples are ready for pick up

## 2014-08-01 ENCOUNTER — Telehealth: Payer: Self-pay | Admitting: Cardiovascular Disease

## 2014-08-01 MED ORDER — NEBIVOLOL HCL 5 MG PO TABS: 5.0000 mg | ORAL_TABLET | Freq: Every day | ORAL | Status: DC

## 2014-08-01 NOTE — Telephone Encounter (Signed)
Pt called in stating that she has pill of Bystolic left and would like to know if she could get more samples. Please call  Thanks

## 2014-08-14 ENCOUNTER — Other Ambulatory Visit: Payer: Self-pay | Admitting: Adult Health

## 2014-08-14 MED ORDER — LACOSAMIDE 50 MG PO TABS: 100.0000 mg | ORAL_TABLET | Freq: Two times a day (BID) | ORAL | Status: DC

## 2014-08-14 NOTE — Telephone Encounter (Signed)
Patient requesting Rx refill of lacosamide ([name]) 50 MG TABS tablet.  Please call and advise.

## 2014-08-14 NOTE — Telephone Encounter (Signed)
Request entered, forwarded to provider for approval.  

## 2014-08-15 ENCOUNTER — Other Ambulatory Visit: Payer: Self-pay | Admitting: Neurology

## 2014-08-18 ENCOUNTER — Telehealth: Payer: Self-pay | Admitting: Adult Health

## 2014-08-18 NOTE — Telephone Encounter (Signed)
I called back.  Spoke with Merrilee Seashore.  He said they were looking at a wrong Rx, and we could disregard the call.  Nothing further is need.

## 2014-08-18 NOTE — Telephone Encounter (Signed)
Merrilee Seashore, Pharmacist at [facility] [phone], questioning dosage amount for lacosamide ([name]) 50 MG TABS tablet.  Please call and advise.

## 2014-08-20 ENCOUNTER — Telehealth: Payer: Self-pay | Admitting: Cardiovascular Disease

## 2014-08-20 MED ORDER — NEBIVOLOL HCL 5 MG PO TABS: 5.0000 mg | ORAL_TABLET | Freq: Every day | ORAL | Status: DC

## 2014-08-20 NOTE — Telephone Encounter (Signed)
Samples provided. Patient notified.

## 2014-08-20 NOTE — Telephone Encounter (Signed)
Pt would like some samples of Bystolic 5mg please °

## 2014-08-21 ENCOUNTER — Telehealth: Payer: Self-pay | Admitting: Adult Health

## 2014-08-21 MED ORDER — LACOSAMIDE 50 MG PO TABS: 50.0000 mg | ORAL_TABLET | Freq: Two times a day (BID) | ORAL | Status: DC

## 2014-08-21 NOTE — Telephone Encounter (Signed)
Patient calling to discuss with Jinny Blossom about her medication dosage, states that she feels her dosage is too high and that she's unable to function, please return call and advise.

## 2014-08-21 NOTE — Telephone Encounter (Signed)
I called the patient. She states that the vimpat made her feel "weird." So she decreased her dose to 1.5 tablets daily. I have advised the patient that we will decrease the dose to 1 tablet BID. She is to try this consistently and let know if she is going to be able tolerate it. She has tried Keppra in the past but could not tolerate that medication.

## 2014-08-22 ENCOUNTER — Ambulatory Visit: Payer: BC Managed Care – PPO | Admitting: Adult Health

## 2014-08-28 ENCOUNTER — Telehealth: Payer: Self-pay | Admitting: Hematology

## 2014-08-28 ENCOUNTER — Ambulatory Visit (HOSPITAL_BASED_OUTPATIENT_CLINIC_OR_DEPARTMENT_OTHER): Payer: BC Managed Care – PPO | Admitting: Hematology

## 2014-08-28 ENCOUNTER — Encounter: Payer: Self-pay | Admitting: Hematology

## 2014-08-28 ENCOUNTER — Other Ambulatory Visit (HOSPITAL_BASED_OUTPATIENT_CLINIC_OR_DEPARTMENT_OTHER): Payer: BC Managed Care – PPO

## 2014-08-28 VITALS — BP 157/70 | HR 66 | Temp 98.4°F | Resp 18 | Ht 62.0 in | Wt 210.9 lb

## 2014-08-28 DIAGNOSIS — D473 Essential (hemorrhagic) thrombocythemia: Secondary | ICD-10-CM

## 2014-08-28 LAB — CBC WITH DIFFERENTIAL/PLATELET
BASO%: 1.3 % (ref 0.0–2.0)
Basophils Absolute: 0.1 10*3/uL (ref 0.0–0.1)
EOS ABS: 0.2 10*3/uL (ref 0.0–0.5)
EOS%: 3.6 % (ref 0.0–7.0)
HCT: 41.5 % (ref 34.8–46.6)
HGB: 13.7 g/dL (ref 11.6–15.9)
LYMPH#: 1.3 10*3/uL (ref 0.9–3.3)
LYMPH%: 26.7 % (ref 14.0–49.7)
MCH: 35 pg — AB (ref 25.1–34.0)
MCHC: 33.2 g/dL (ref 31.5–36.0)
MCV: 105.5 fL — AB (ref 79.5–101.0)
MONO#: 0.4 10*3/uL (ref 0.1–0.9)
MONO%: 8.4 % (ref 0.0–14.0)
NEUT%: 60 % (ref 38.4–76.8)
NEUTROS ABS: 3 10*3/uL (ref 1.5–6.5)
Platelets: 399 10*3/uL (ref 145–400)
RBC: 3.93 10*6/uL (ref 3.70–5.45)
RDW: 11.9 % (ref 11.2–14.5)
WBC: 4.9 10*3/uL (ref 3.9–10.3)

## 2014-08-28 NOTE — Telephone Encounter (Signed)
Gave avs & cal for Dec/Jan. °

## 2014-08-28 NOTE — Progress Notes (Signed)
[facility] PROGRESS NOTE DATE OF VISIT: 08/28/2014  [name], MD [address] [place] [address]  DIAGNOSIS: Essential thrombocythemia - Plan: CBC with Differential, Comprehensive metabolic panel (Cmet) - [facility]  CC: Essential Thrombocytosis  CURRENT THERAPY: Hydroxyurea 500 mg daily as of 07/22/2014.  Aspirin 81 mg daily.  INTERVAL HISTORY:  Sara [name] 58 y.o. female with history of JAK positive Essential thrombocythemia presents for six month follow-up.  She was last seen by me on 07/22/2014. She reports that she is doing very well.  She denies easy bruising.  She had colonoscopy by Dr. Ferdinand Lango in 2013 with a few polyps.  She will schedule a follow up with one of the affiliates.  She saw Dr. Baird Cancer last year for her annual exam.  She has stopped plavix based on recommendations from cardiology (stent in the kidney).  She was off diabetes medicine due to frequent "black outs" and her A1C is at 5.7.  She has also had her blood pressure medications reduced. She had her mammogram last year. Patient has noticed some "absence seizures" versus "narcolepsy" episodes and underwent an EEG study which was normal, she was evaluated by a neurologist and was placed on [place]. She did not have any more episodes since she was placed on anti-seizure therapy. She has been using CPAP for about a year and shows compliance. Patient was questioning whether Hydrea can cause seizures. She was taking 1 g alternating with 500 mg and the days she take 1 g, almost all seizure events that happened on those days. Review of the medical literature does not suggest Hydrea causing seizures on lowering the threshold for seizures. Since her counts are extremely stable, I decreased her dose to 500 mg dose of Hydrea and repeated a CBC with platelet count today.    Patient states that her appetite is not good and she does not eat much after 6 PM. She also had a change in her  seizure medications but it is affecting her functionally she takes a full dose and monitoring on behalf of the tablet in evening. She also other conditions as his hyperlipidemia hypertension gout and ET.   MEDICAL HISTORY: Past Medical History  Diagnosis Date  . Thrombocytosis   . Hypertension   . Diabetes mellitus   . Gout   . Renal artery stenosis 12/13/2010    PTCA and Stent - right  . Syncopal episodes   . PAD (peripheral artery disease)   . Hyperlipemia   . Morbid obesity   . LVH (left ventricular hypertrophy)     12/21/09 Echo -mild asymmetric LVH,EF =>55%  . OSA (obstructive sleep apnea)    INTERIM HISTORY: has Essential thrombocytosis; Hypertension; Type II or unspecified type diabetes mellitus without mention of complication, not stated as uncontrolled; Acute renal failure; History of renal stent; Orthostatic hypotension; Diastolic CHF; Syncope; OSA (obstructive sleep apnea); Hyperlipidemia; Sleep apnea with use of continuous positive airway pressure (CPAP); and Typical absence seizures on her problem list.    ALLERGIES:  is allergic to sulfa antibiotics.  MEDICATIONS: has a current medication list which includes the following prescription(s): aspirin ec, atorvastatin, b complex vitamins, vitamin d3, hydroxyurea, lacosamide, levetiracetam, losartan, nebivolol, STUDY MEDICATION, and vitamin c.  SURGICAL HISTORY:  Past Surgical History  Procedure Laterality Date  . Breast reduction surgery  1995    S/P Bilateral breast reduction  . Cesarean section  1980, 1986    Times  Two.  . Breast cyst excision  S/P Benign Right Breast Cyst Removal.  . Partial hysterectomy  1994  . Extremity cyst excision  1993    left wrist  . Renal artery stent placement Right     REVIEW OF SYSTEMS:   Constitutional: Denies fevers, chills or abnormal weight loss Eyes: Denies blurriness of vision Ears, nose, mouth, throat, and face: Denies mucositis or sore throat Respiratory: Denies cough,  dyspnea or wheezes Cardiovascular: Denies palpitation, chest discomfort or lower extremity swelling Gastrointestinal:  Denies nausea, heartburn ; she has constipation.  Skin: Denies abnormal skin rashes Lymphatics: Denies new lymphadenopathy or easy bruising Neurological:Denies numbness, tingling or new weaknesses Behavioral/Psych: Mood is stable, no new changes  All other systems were reviewed with the patient and are negative.  PHYSICAL EXAMINATION: ECOG PERFORMANCE STATUS: 0  Blood pressure 157/70, pulse 66, temperature 98.4 F (36.9 C), temperature source Oral, resp. rate 18, height 5\' 2"  (1.575 m), weight 210 lb 14.4 oz (95.664 kg), SpO2 99 %.  GENERAL:alert, no distress and comfortable; obese pleasant middle-age woman SKIN: skin color, texture, turgor are normal, no rashes or significant lesions EYES: normal, Conjunctiva are pink and non-injected, sclera clear OROPHARYNX:no exudate, no erythema and lips, buccal mucosa, and tongue normal  NECK: supple, thyroid normal size, non-tender, without nodularity LYMPH:  no palpable lymphadenopathy in the cervical, axillary or inguinal LUNGS: clear to auscultation and percussion with normal breathing effort HEART: regular rate & rhythm and no murmurs and no lower extremity edema ABDOMEN:abdomen soft,  Mildly tender in the epigastric and normal bowel sounds Musculoskeletal:no cyanosis of digits and no clubbing  NEURO: alert & oriented x 3 with fluent speech, no focal motor/sensory deficits   LABORATORY DATA:  As above    RADIOGRAPHIC STUDIES:  DG CHEST 2 VIEW 01/05/2014 CLINICAL DATA: Dizzy. EXAM: CHEST 2 VIEW COMPARISON: DG CHEST 2 VIEW dated 07/20/2013 FINDINGS:  Mediastinum and hilar structures normal. Subsegmental atelectasis left lung base. No pleural effusion or pneumothorax. Heart size  normal. Degenerative changes thoracic spine IMPRESSION: Mild subsegmental atelectasis left lung base, otherwise negative chest.   ASSESSMENT:  Essential thrombocythemia. Diagnosed in 2010. JAK2 mutation was present. The patient underwent a bone marrow aspirate and biopsy on 01/20/2009 which yielded a specimen that was suboptimal for evaluation. However, megakaryocytes were abundant with normal  morphology. There was no clustering or excess blasts seen. Platelet count on 01/20/2009 was 881. The patient is being treated with aspirin and hydroxyurea.  PLAN:  1. Essential Thrombocythemia. --I am going to continue her hydrea dose to 500 mg daily and will closely monitor how her CBC trends and platelet count. We will check CBC in 8 weeks given the stability of her plts and her plt trend. The patient had a mammogram on 1027/2014 which was negative for malignancy. Although there is no established association of hydrea causing seizures (few case reports exist), I will be little bit cautious and will try a lower dose of hydrea to see if we can still effectively control her platelets.  2. Follow-up.  We will have her return in about 8 weeks at which time we will check CBC. I also told that Dr Burr Medico may be seeing her with next visit.  --All questions were answered. The patient knows to call the clinic with any problems, questions or concerns. We can certainly see the patient much sooner if necessary.  --I spent 15 minutes counseling the patient face to face. The total time spent in the appointment was 25 minutes.    Bernadene Bell, MD Medical  [place] Pager: [phone] Office No: [phone]

## 2014-09-08 ENCOUNTER — Telehealth: Payer: Self-pay | Admitting: Adult Health

## 2014-09-08 NOTE — Telephone Encounter (Signed)
Patient calling to state that her [name] is causing some side effects, please return call and advise.

## 2014-09-08 NOTE — Telephone Encounter (Signed)
I was returning the patient's call. I left a message on her home phone. She can call our office at her convenience.

## 2014-09-08 NOTE — Telephone Encounter (Signed)
I called back.  Patient was hesitant to divulge further info.  Says she prefers to speak directly with [facility].   Indicates she is feeling shaky, "on edge" and is having difficulty sleeping.  She is unsure if it is a side effect from [name] or not.  Says she takes 7 different medications in the morning and has also recently discontinued her chemo drug.  Wanted Megan to know she ate yogurt, one half of banana and one half peanut butter and toast this morning when taking meds.  Wants to know if perhaps she should eat more or less to help with meds?  Will be at work until [employer], and can be reached on home line after that time.  Please advise.  Thank you.

## 2014-09-09 ENCOUNTER — Telehealth: Payer: Self-pay | Admitting: Adult Health

## 2014-09-09 NOTE — Telephone Encounter (Signed)
I called the patient. She is concerned about a "shaky feeling" that she has  during the day. She also states that she goes to bed around 8:30 and wakes up at 2:00 wide-awake. The patient is also dieting. I have advised the patient that she should make sure she is eating 6 small healthy meals a today. I do not think the shaky feeling is coming from the [name]. I do feel that the patient has high anxiety when it comes to her medication list. The patient agrees that this could be part of the issue. She states  that she will start eating 6 small meals a day to see if that helps with the "shaky feeling." she will continue taking [name] 50 mg twice a day. The patient has not had any additional seizures since starting the [name]. I advised the patient that  if she has any other questions she can call my office.

## 2014-09-24 ENCOUNTER — Other Ambulatory Visit: Payer: Self-pay | Admitting: Internal Medicine

## 2014-09-25 ENCOUNTER — Telehealth: Payer: Self-pay | Admitting: *Deleted

## 2014-09-25 ENCOUNTER — Other Ambulatory Visit: Payer: Self-pay | Admitting: Internal Medicine

## 2014-09-25 ENCOUNTER — Other Ambulatory Visit: Payer: Self-pay | Admitting: Hematology

## 2014-09-25 DIAGNOSIS — D473 Essential (hemorrhagic) thrombocythemia: Secondary | ICD-10-CM

## 2014-09-25 NOTE — Telephone Encounter (Signed)
PATIENT CALLED REPORTING HER PHARMACY HAS BEEN TRYING TO GET THE HYDREA REFILLED BUT NO RESPONSE FROM [facility].  HAS ONE PILL LEFT.  WAS INSTRUCTED THE DOSE MAY CHANGE PENDING LAB RESULTS BUT :I KNOW I SHOULD NOT STOP THIS MEDICINE".  WILL REFILL UNDER ON-CALL PROVIDER.  SCHEDULED FOR LAB ON 10-21-2014 SO THIS NURSE INSTRUCTED HER TO CALL FOR RESULTS AND INQUIRE ABOUT f/u IF SHE HAS NOT HEARD FROM THIS OFFICE.

## 2014-09-29 ENCOUNTER — Ambulatory Visit: Payer: BC Managed Care – PPO | Admitting: Nurse Practitioner

## 2014-10-08 ENCOUNTER — Telehealth: Payer: Self-pay | Admitting: Hematology

## 2014-10-08 ENCOUNTER — Telehealth: Payer: Self-pay | Admitting: Hematology and Oncology

## 2014-10-08 NOTE — Telephone Encounter (Signed)
, °

## 2014-10-09 ENCOUNTER — Other Ambulatory Visit: Payer: Self-pay | Admitting: Cardiovascular Disease

## 2014-10-09 MED ORDER — NEBIVOLOL HCL 5 MG PO TABS: 5.0000 mg | ORAL_TABLET | Freq: Every day | ORAL | Status: DC

## 2014-10-09 NOTE — Telephone Encounter (Signed)
Pt would like some samples of Bystolic 5mg please °

## 2014-10-09 NOTE — Telephone Encounter (Signed)
Left VM on name-verified VM that samples are available for pick up

## 2014-10-09 NOTE — Telephone Encounter (Signed)
Samples Bystolic 5

## 2014-10-21 ENCOUNTER — Other Ambulatory Visit: Payer: BC Managed Care – PPO

## 2014-10-21 ENCOUNTER — Ambulatory Visit: Payer: BC Managed Care – PPO | Admitting: Adult Health

## 2014-10-21 ENCOUNTER — Telehealth: Payer: Self-pay | Admitting: Adult Health

## 2014-10-21 NOTE — Telephone Encounter (Signed)
Patient no showed for a revisit appointment.  

## 2014-10-22 ENCOUNTER — Encounter: Payer: Self-pay | Admitting: Adult Health

## 2014-10-22 ENCOUNTER — Ambulatory Visit (INDEPENDENT_AMBULATORY_CARE_PROVIDER_SITE_OTHER): Payer: BC Managed Care – PPO | Admitting: Adult Health

## 2014-10-22 VITALS — BP 168/79 | HR 69 | Ht 62.0 in | Wt 214.8 lb

## 2014-10-22 DIAGNOSIS — G40A09 Absence epileptic syndrome, not intractable, without status epilepticus: Secondary | ICD-10-CM

## 2014-10-22 MED ORDER — LACOSAMIDE 50 MG PO TABS: 50.0000 mg | ORAL_TABLET | Freq: Two times a day (BID) | ORAL | Status: DC

## 2014-10-22 NOTE — Patient Instructions (Signed)
Continue [name].  Call this number for [name] savings card. [phone]  If you have any additional seizures please let us know.

## 2014-10-22 NOTE — Progress Notes (Signed)
PATIENT: Sara [name] DOB: 16-Nov-1955  REASON FOR VISIT: follow up HISTORY FROM: patient  HISTORY OF PRESENT ILLNESS: Sara [name] is a 58 year old female with a history of seizures. Sara [name] returns today for follow-up. Sara [name] is currently taking [name] 50 mg twice a day. Sara [name] states that Sara [name] has not had any additional seizure episodes. Sara [name] has tried taking Keppra in the past but was unable to tolerate it due to drowsiness. The patient is currently still dieting. Sara [name] states over the holidays Sara [name] was extremely stressed which caused her to fatigue really quickly. Overall Sara [name] feels that the vimpat is working well for her.   HISTORY 07/16/14: Sara [name] is a 58 year old female with a history of seizures. Sara [name] was taken to the hospital on 9/22 for a seizure. Sara [name] states that Sara [name] collapsed while shopping. Sara [name] states that the night before Sara [name] had a staring spell that lasted less than one minute. Sara [name] thought it was due to not eating much. The next afternoon her and her husband went to [employer] to shop. Sara [name] states that Sara [name] started feeling funny and apparently collapsed. The staff at the store called EMS. Her blood sugar and BP was normal. Her husband was not with her when this happened. Sara [name] is currently taking Keppra 500 mg 1/2 tablet at night. Sara [name] can't take higher doses of Keppra due to drowsiness. The hospital physician started [name]. Sara [name] is not operating a motor vehicle. Sara [name] returns today for evaluation.  HISTORY 04/22/14: 58 year old female with a history of seizures. Sara [name] was recently seen in our office on 03/28/2014. Since then Sara [name] was taken to the emergency room because Sara [name] felt "funny" but reports Sara [name] did not have a seizure. The patient was taking Keppra half a tablet daily. This was not the prescribed dosage. The patient should be taken half a tablet twice a day for 2 weeks then increasing to one tablet twice a day. Patient states that Sara [name] can't take 1 whole tablet because it makes her sleepy so Sara [name] only  takes 1/2 tablet at night. Patient states that her PCP and hematologist have changed her BP medication and reduced her hydrea. Sara [name] reports Sara [name] has not had a seizure since starting Keppra. Her husband agrees with that.  REVIEW OF SYSTEMS: Out of a complete 14 system review of symptoms, the patient complains only of the following symptoms, and all other reviewed systems are negative.  ALLERGIES: Allergies  Allergen Reactions  . Sulfa Antibiotics Itching    HOME MEDICATIONS: Outpatient Prescriptions Prior to Visit  Medication Sig Dispense Refill  . aspirin EC 81 MG tablet Take 81 mg by mouth daily.    Marland Kitchen atorvastatin (LIPITOR) 10 MG tablet Take 10 mg by mouth every evening.     . B Complex Vitamins (B COMPLEX PO) Take 1 tablet by mouth daily.     . Cholecalciferol (VITAMIN D3) 2000 UNITS capsule Take 1,000 Units by mouth daily.     . hydroxyurea (HYDREA) 500 MG capsule TAKE 1 CAPSULE BY MOUTH ALTERNATING WITH 2 CAPSULES DAILY AS DIRECTED BY PHYSICIAN (Patient taking differently: TAKE 1 CAPSULE BY MOUTH) 120 capsule 1  . lacosamide ([name]) 50 MG TABS tablet Take 1 tablet (50 mg total) by mouth 2 (two) times daily. 120 tablet 3  . losartan (COZAAR) 25 MG tablet Take 25 mg by mouth daily.    . nebivolol (BYSTOLIC) 5 MG tablet Take 1 tablet (5 mg total) by mouth daily. 21 tablet 0  . STUDY MEDICATION  Take 100 mg by mouth daily. Research study with Dr. Baird Cancer - pt may be receiving placebo or allopurinol Protocol [id number] 1 capsule [id number] 1 capsule    . vitamin C (ASCORBIC ACID) 500 MG tablet Take 500 mg by mouth every evening.     . levETIRAcetam (KEPPRA) 500 MG tablet      No facility-administered medications prior to visit.    PAST MEDICAL HISTORY: Past Medical History  Diagnosis Date  . Thrombocytosis   . Hypertension   . Diabetes mellitus   . Gout   . Renal artery stenosis 12/13/2010    PTCA and Stent - right  . Syncopal episodes   . PAD (peripheral artery disease)     . Hyperlipemia   . Morbid obesity   . LVH (left ventricular hypertrophy)     12/21/09 Echo -mild asymmetric LVH,EF =>55%  . OSA (obstructive sleep apnea)     PAST SURGICAL HISTORY: Past Surgical History  Procedure Laterality Date  . Breast reduction surgery  1995    S/P Bilateral breast reduction  . Cesarean section  1980, 1986    Times  Two.  . Breast cyst excision      S/P Benign Right Breast Cyst Removal.  . Partial hysterectomy  1994  . Extremity cyst excision  1993    left wrist  . Renal artery stent placement Right     FAMILY HISTORY: Family History  Problem Relation Age of Onset  . Breast cancer Maternal Aunt     2 maternal aunts had breast cancer.  Marland Kitchen Heart failure Mother   . Diabetes Father   . Diabetes Sister   . Hypertension Sister   . Breast cancer Maternal Grandmother   . Hypertension Sister   . Hypertension Sister     SOCIAL HISTORY: History   Social History  . Marital Status: Married    Spouse Name: Juanda Crumble    Number of Children: 2  . Years of Education: 16   Occupational History  . [employer].    Social History Main Topics  . Smoking status: Never Smoker   . Smokeless tobacco: Never Used  . Alcohol Use: No  . Drug Use: No  . Sexual Activity: Yes   Other Topics Concern  . Not on file   Social History Narrative   Patient lives at husband and her son, home with family.   Patient has two adult children.   Patient is not drinking any caffeine.   Patient is working full-time.   Patient has a college education.   Patient is right-handed.      PHYSICAL EXAM  Filed Vitals:   10/22/14 0825  BP: 168/79  Pulse: 69  Height: 5\' 2"  (1.575 m)  Weight: 214 lb 12.8 oz (97.433 kg)   Body mass index is 39.28 kg/(m^2).  Generalized: Well developed, in no acute distress   Neurological examination  Mentation: Alert oriented to time, place, history taking. Follows all commands speech and language fluent Cranial nerve II-XII: Pupils  were equal round reactive to light. Extraocular movements were full, visual field were full on confrontational test. Facial sensation and strength were normal. Uvula tongue midline. Head turning and shoulder shrug  were normal and symmetric. Motor: The motor testing reveals 5 over 5 strength of all 4 extremities. Good symmetric motor tone is noted throughout.  Sensory: Sensory testing is intact to soft touch on all 4 extremities. No evidence of extinction is noted.  Coordination: Cerebellar testing reveals good finger-nose-finger and heel-to-shin  bilaterally.  Gait and station: Gait is normal. Tandem gait is normal. Romberg is negative. No drift is seen.  Reflexes: Deep tendon reflexes are symmetric and normal bilaterally.    DIAGNOSTIC DATA (LABS, IMAGING, TESTING) - I reviewed patient records, labs, notes, testing and imaging myself where available.  Lab Results  Component Value Date   WBC 4.9 08/28/2014   HGB 13.7 08/28/2014   HCT 41.5 08/28/2014   MCV 105.5* 08/28/2014   PLT 399 08/28/2014      Component Value Date/Time   NA 142 05/07/2014 0812   NA 138 04/14/2014 0002   K 4.1 05/07/2014 0812   K 3.4* 04/14/2014 0002   CL 100 04/14/2014 0002   CL 102 03/19/2013 1535   CO2 27 05/07/2014 0812   CO2 26 04/14/2014 0002   GLUCOSE 96 05/07/2014 0812   GLUCOSE 101* 04/14/2014 0002   GLUCOSE 117* 03/19/2013 1535   BUN 14.9 05/07/2014 0812   BUN 16 04/14/2014 0002   CREATININE 1.1 05/07/2014 0812   CREATININE 1.15* 04/14/2014 0002   CALCIUM 9.3 05/07/2014 0812   CALCIUM 8.9 04/14/2014 0002   PROT 6.9 04/14/2014 0002   PROT 7.2 02/27/2014 0907   ALBUMIN 3.5 04/14/2014 0002   ALBUMIN 3.7 02/27/2014 0907   AST 17 04/14/2014 0002   AST 19 02/27/2014 0907   ALT 22 04/14/2014 0002   ALT 17 02/27/2014 0907   ALKPHOS 84 04/14/2014 0002   ALKPHOS 89 02/27/2014 0907   BILITOT 0.3 04/14/2014 0002   BILITOT 0.39 02/27/2014 0907   GFRNONAA 51* 04/14/2014 0002   GFRAA 59* 04/14/2014  0002   Lab Results  Component Value Date   CHOL 100 07/21/2013   HDL 44 07/21/2013   LDLCALC 46 07/21/2013   TRIG 52 07/21/2013   CHOLHDL 2.3 07/21/2013   Lab Results  Component Value Date   HGBA1C 5.9* 07/20/2013    Lab Results  Component Value Date   TSH 2.839 07/21/2013      ASSESSMENT AND PLAN 58 y.o. year old female  has a past medical history of Thrombocytosis; Hypertension; Diabetes mellitus; Gout; Renal artery stenosis (12/13/2010); Syncopal episodes; PAD (peripheral artery disease); Hyperlipemia; Morbid obesity; LVH (left ventricular hypertrophy); and OSA (obstructive sleep apnea). here with:  1. Seizures  Continue [name].  Try to get adequate rest and decrease stressors.  No driving till March 5277.  If you have any additional seizures please let us know.  Follow-up in 6 months.   Ward Givens, MSN, NP-C 10/22/2014, 8:32 AM [facility] [address], [place], [place] [address] [phone]  Note: This document was prepared with digital dictation and possible smart phrase technology. Any transcriptional errors that result from this process are unintentional.

## 2014-10-22 NOTE — Progress Notes (Signed)
I have read the note, and I agree with the clinical assessment and plan.  Naziyah Tieszen KEITH   

## 2014-10-27 ENCOUNTER — Other Ambulatory Visit (HOSPITAL_BASED_OUTPATIENT_CLINIC_OR_DEPARTMENT_OTHER): Payer: BC Managed Care – PPO

## 2014-10-27 DIAGNOSIS — D473 Essential (hemorrhagic) thrombocythemia: Secondary | ICD-10-CM

## 2014-10-27 LAB — COMPREHENSIVE METABOLIC PANEL (CC13)
ALBUMIN: 3.6 g/dL (ref 3.5–5.0)
ALT: 28 U/L (ref 0–55)
AST: 19 U/L (ref 5–34)
Alkaline Phosphatase: 86 U/L (ref 40–150)
Anion Gap: 10 mEq/L (ref 3–11)
BUN: 21.1 mg/dL (ref 7.0–26.0)
CO2: 30 mEq/L — ABNORMAL HIGH (ref 22–29)
CREATININE: 1.1 mg/dL (ref 0.6–1.1)
Calcium: 9.3 mg/dL (ref 8.4–10.4)
Chloride: 104 mEq/L (ref 98–109)
EGFR: 60 mL/min/{1.73_m2} — ABNORMAL LOW (ref >90)
Glucose: 90 mg/dl (ref 70–140)
POTASSIUM: 3.7 meq/L (ref 3.5–5.1)
Sodium: 143 mEq/L (ref 136–145)
Total Bilirubin: 0.52 mg/dL (ref 0.20–1.20)
Total Protein: 7 g/dL (ref 6.4–8.3)

## 2014-10-27 LAB — CBC WITH DIFFERENTIAL/PLATELET
BASO%: 0.8 % (ref 0.0–2.0)
Basophils Absolute: 0 10*3/uL (ref 0.0–0.1)
EOS ABS: 0.2 10*3/uL (ref 0.0–0.5)
EOS%: 3.5 % (ref 0.0–7.0)
HCT: 41.8 % (ref 34.8–46.6)
HEMOGLOBIN: 13.8 g/dL (ref 11.6–15.9)
LYMPH#: 1.5 10*3/uL (ref 0.9–3.3)
LYMPH%: 29.2 % (ref 14.0–49.7)
MCH: 33.9 pg (ref 25.1–34.0)
MCHC: 33 g/dL (ref 31.5–36.0)
MCV: 102.7 fL — ABNORMAL HIGH (ref 79.5–101.0)
MONO#: 0.4 10*3/uL (ref 0.1–0.9)
MONO%: 7.4 % (ref 0.0–14.0)
NEUT%: 59.1 % (ref 38.4–76.8)
NEUTROS ABS: 3 10*3/uL (ref 1.5–6.5)
Platelets: 427 10*3/uL — ABNORMAL HIGH (ref 145–400)
RBC: 4.07 10*6/uL (ref 3.70–5.45)
RDW: 11.5 % (ref 11.2–14.5)
WBC: 5.1 10*3/uL (ref 3.9–10.3)

## 2014-10-28 ENCOUNTER — Other Ambulatory Visit: Payer: BC Managed Care – PPO

## 2014-10-28 ENCOUNTER — Ambulatory Visit: Payer: BC Managed Care – PPO

## 2014-11-03 ENCOUNTER — Telehealth: Payer: Self-pay | Admitting: Hematology

## 2014-11-03 ENCOUNTER — Ambulatory Visit (HOSPITAL_BASED_OUTPATIENT_CLINIC_OR_DEPARTMENT_OTHER): Payer: BC Managed Care – PPO | Admitting: Hematology

## 2014-11-03 ENCOUNTER — Encounter: Payer: Self-pay | Admitting: Hematology

## 2014-11-03 VITALS — BP 165/76 | HR 65 | Temp 98.0°F | Resp 18 | Ht 62.0 in | Wt 215.4 lb

## 2014-11-03 DIAGNOSIS — D473 Essential (hemorrhagic) thrombocythemia: Secondary | ICD-10-CM

## 2014-11-03 DIAGNOSIS — R569 Unspecified convulsions: Secondary | ICD-10-CM

## 2014-11-03 MED ORDER — HYDROXYUREA 500 MG PO CAPS: 500.0000 mg | ORAL_CAPSULE | Freq: Every day | ORAL | Status: DC

## 2014-11-03 NOTE — Telephone Encounter (Signed)
gv adn printed appt sched and avs for pt for April  °

## 2014-11-03 NOTE — Progress Notes (Signed)
[facility] PROGRESS NOTE DATE OF VISIT: 08/28/2014  Sara Greenland, MD [address] [place] [address]  DIAGNOSIS: Essential thrombocytosis - Plan: CBC with Differential, Comprehensive metabolic panel (Cmet) - [facility]  Essential thrombocythemia  CC: follow up Essential Thrombocytosis  CURRENT THERAPY: Hydroxyurea 500 mg daily, started in 2010.  Aspirin 81 mg daily.  INTERVAL HISTORY:  Sara [name] 59 y.o. female with history of JAK positive Essential thrombocythemia presents for six month follow-up.  She was last seen by Dr. Lona Kettle on 09/07/2014. She reports that she is doing very well.  Patient had a few apt episodes of seizure last year, she was concerned about medication related seizure. Hydrea was subsequently reduced to 500 once daily. She has been tolerated very well. Her last episode of seizure was in September 2015.   Patient states she is doing well overall. She has mild fatigue, which is stable. She denies any chest pain, dyspnea, leg swollen, or any other new symptoms.  She denied any bleeding episodes including hematochezia, melana, hemoptysis, hematuria or epitaxis. No mucosal bleeding or easy bruising.     MEDICAL HISTORY: Past Medical History  Diagnosis Date  . Thrombocytosis   . Hypertension   . Diabetes mellitus   . Gout   . Renal artery stenosis 12/13/2010    PTCA and Stent - right  . Syncopal episodes   . PAD (peripheral artery disease)   . Hyperlipemia   . Morbid obesity   . LVH (left ventricular hypertrophy)     12/21/09 Echo -mild asymmetric LVH,EF =>55%  . OSA (obstructive sleep apnea)      ALLERGIES:  is allergic to sulfa antibiotics.  MEDICATIONS: has a current medication list which includes the following prescription(s): aspirin ec, atorvastatin, b complex vitamins, vitamin d3, lacosamide, losartan, nebivolol, STUDY MEDICATION, and hydroxyurea.  SURGICAL HISTORY:  Past Surgical History  Procedure  Laterality Date  . Breast reduction surgery  1995    S/P Bilateral breast reduction  . Cesarean section  1980, 1986    Times  Two.  . Breast cyst excision      S/P Benign Right Breast Cyst Removal.  . Partial hysterectomy  1994  . Extremity cyst excision  1993    left wrist  . Renal artery stent placement Right     REVIEW OF SYSTEMS:   Constitutional: Denies fevers, chills or abnormal weight loss Eyes: Denies blurriness of vision Ears, nose, mouth, throat, and face: Denies mucositis or sore throat Respiratory: Denies cough, dyspnea or wheezes Cardiovascular: Denies palpitation, chest discomfort or lower extremity swelling Gastrointestinal:  Denies nausea, heartburn ; she has constipation.  Skin: Denies abnormal skin rashes Lymphatics: Denies new lymphadenopathy or easy bruising Neurological:Denies numbness, tingling or new weaknesses Behavioral/Psych: Mood is stable, no new changes  All other systems were reviewed with the patient and are negative.  PHYSICAL EXAMINATION: ECOG PERFORMANCE STATUS: 0  Blood pressure 165/76, pulse 65, temperature 98 F (36.7 C), temperature source Oral, resp. rate 18, height 5\' 2"  (1.575 m), weight 215 lb 6.4 oz (97.705 kg), SpO2 99 %.  GENERAL:alert, no distress and comfortable; obese pleasant middle-age woman SKIN: skin color, texture, turgor are normal, no rashes or significant lesions EYES: normal, Conjunctiva are pink and non-injected, sclera clear OROPHARYNX:no exudate, no erythema and lips, buccal mucosa, and tongue normal  NECK: supple, thyroid normal size, non-tender, without nodularity LYMPH:  no palpable lymphadenopathy in the cervical, axillary or inguinal LUNGS: clear to auscultation and percussion with  normal breathing effort HEART: regular rate & rhythm and no murmurs and no lower extremity edema ABDOMEN:abdomen soft,  Mildly tender in the epigastric and normal bowel sounds Musculoskeletal:no cyanosis of digits and no clubbing   NEURO: alert & oriented x 3 with fluent speech, no focal motor/sensory deficits   LABORATORY DATA:  CBC Latest Ref Rng 10/27/2014 08/28/2014 05/30/2014  WBC 3.9 - 10.3 10e3/uL 5.1 4.9 4.5  Hemoglobin 11.6 - 15.9 g/dL 13.8 13.7 13.2  Hematocrit 34.8 - 46.6 % 41.8 41.5 39.6  Platelets 145 - 400 10e3/uL 427(H) 399 367    CMP Latest Ref Rng 10/27/2014 05/07/2014 04/14/2014  Glucose 70 - 140 mg/dl 90 96 101(H)  BUN 7.0 - 26.0 mg/dL 21.1 14.9 16  Creatinine 0.6 - 1.1 mg/dL 1.1 1.1 1.15(H)  Sodium 136 - 145 mEq/L 143 142 138  Potassium 3.5 - 5.1 mEq/L 3.7 4.1 3.4(L)  Chloride 96 - 112 mEq/L - - 100  CO2 22 - 29 mEq/L 30(H) 27 26  Calcium 8.4 - 10.4 mg/dL 9.3 9.3 8.9  Total Protein 6.4 - 8.3 g/dL 7.0 - 6.9  Total Bilirubin 0.20 - 1.20 mg/dL 0.52 - 0.3  Alkaline Phos 40 - 150 U/L 86 - 84  AST 5 - 34 U/L 19 - 17  ALT 0 - 55 U/L 28 - 22   Pathology report 01/20/2009 BONE MARROW, RIGHT POSTERIOR ILIAC CREST, BIOPSY AND ASPIRATION: - CELLULAR MARROW WITH TRILINEAGE HEMATOPOIESIS AND MATURATION. - NO EVIDENCE OF EXCESS BLASTS.   BONE MARROW ASPIRATE: The aspirate material is composed of a few somewhat cellular bone marrow particles displaying a mixture of cell types. Erythroid and granulocytic series show orderly and progressive maturation. Megakaryocytes are abundant with predominantly normal morphology.  BONE MARROW CORE BIOPSY: The bone marrow core biopsy has very prominent aspiration artifact. Sections show 40 to 60% cellularity with a mixture of cell types. The megakaryocytes are abundant with normal morphology. No megakaryocyte clustering is seen. The granulocytic series and erythroid series show orderly and progressive maturation. No excess blasts are seen.  Karyotype: 46,XX[20].nuc ish (ABL,BCR)x2  Interpretation: Cytogenetic Analysis:Normal: Cytogenetic analysis revealed the presence of normal female chromosomes with no observable clonal chromosomal  abnormalities.  Molecular Cytogenetic Analysis FISH: Normal: The investigative technique of molecular cytogenetic analysis with DNA probes specific for the Major/Minor ABL (9q34) and BCR (22q11) genes revealed the lack of significant number of BCR/ABL fusion signals. These results are consistent with a normal finding and a lack of the BCR/ABL fusion associated with CML/ALL.  RADIOGRAPHIC STUDIES:  DG CHEST 2 VIEW 01/05/2014 CLINICAL DATA: Dizzy. EXAM: CHEST 2 VIEW COMPARISON: DG CHEST 2 VIEW dated 07/20/2013 FINDINGS:  Mediastinum and hilar structures normal. Subsegmental atelectasis left lung base. No pleural effusion or pneumothorax. Heart size  normal. Degenerative changes thoracic spine IMPRESSION: Mild subsegmental atelectasis left lung base, otherwise negative chest.   ASSESSMENT: Essential thrombocythemia. Diagnosed in 2010. JAK2 mutation was present. The patient underwent a bone marrow aspirate and biopsy on 01/20/2009 which yielded a specimen that was suboptimal for evaluation. However, megakaryocytes were abundant with normal morphology. There was no clustering or excess blasts seen. Platelet count on 01/20/2009 was 881. The patient is being treated with aspirin and hydroxyurea.  PLAN:  1. Essential Thrombocythemia. JAK2 (+)  --I reviewed her above laboratory results and history extensively. I explained to her this chronic disease, not curable but treatable. Small percentage patient will develop myelofibrosis or leukemia in late stage. The disease related risks are thrombosis, including stroke and heart  attack. -I recommend her to continue Hydrea at 500 mg daily. She is tired and well. Her platelet count is 427K today. -Continue aspirin.  2. Seizure -cont meds, follow up   Follow-up.  RTC in 3 month with lab CBC, CMP  --All questions were answered. The patient knows to call the clinic with any problems, questions or concerns. We can certainly see the patient much sooner  if necessary.  --I spent 15 minutes counseling the patient face to face. The total time spent in the appointment was 25 minutes.    Truitt Merle  11/03/2014

## 2015-01-12 ENCOUNTER — Telehealth: Payer: Self-pay | Admitting: Cardiovascular Disease

## 2015-01-12 NOTE — Telephone Encounter (Signed)
Notified patient no bystolic samples available - advised she contact [place] office or call us back later in the week.

## 2015-01-12 NOTE — Telephone Encounter (Signed)
Pt would like samples of Bystolic please.

## 2015-01-26 ENCOUNTER — Ambulatory Visit (HOSPITAL_BASED_OUTPATIENT_CLINIC_OR_DEPARTMENT_OTHER): Payer: BC Managed Care – PPO | Admitting: Hematology

## 2015-01-26 ENCOUNTER — Other Ambulatory Visit (HOSPITAL_BASED_OUTPATIENT_CLINIC_OR_DEPARTMENT_OTHER): Payer: BC Managed Care – PPO

## 2015-01-26 ENCOUNTER — Telehealth: Payer: Self-pay | Admitting: Hematology

## 2015-01-26 VITALS — BP 163/79 | HR 66 | Temp 97.8°F | Resp 18 | Ht 62.0 in | Wt 224.9 lb

## 2015-01-26 DIAGNOSIS — D473 Essential (hemorrhagic) thrombocythemia: Secondary | ICD-10-CM | POA: Diagnosis not present

## 2015-01-26 DIAGNOSIS — R569 Unspecified convulsions: Secondary | ICD-10-CM

## 2015-01-26 LAB — CBC WITH DIFFERENTIAL/PLATELET
BASO%: 0.7 % (ref 0.0–2.0)
Basophils Absolute: 0 10*3/uL (ref 0.0–0.1)
EOS%: 3.5 % (ref 0.0–7.0)
Eosinophils Absolute: 0.2 10*3/uL (ref 0.0–0.5)
HCT: 42.6 % (ref 34.8–46.6)
HEMOGLOBIN: 14.2 g/dL (ref 11.6–15.9)
LYMPH#: 1.6 10*3/uL (ref 0.9–3.3)
LYMPH%: 26.5 % (ref 14.0–49.7)
MCH: 32.7 pg (ref 25.1–34.0)
MCHC: 33.4 g/dL (ref 31.5–36.0)
MCV: 97.9 fL (ref 79.5–101.0)
MONO#: 0.5 10*3/uL (ref 0.1–0.9)
MONO%: 8.5 % (ref 0.0–14.0)
NEUT#: 3.7 10*3/uL (ref 1.5–6.5)
NEUT%: 60.8 % (ref 38.4–76.8)
PLATELETS: 445 10*3/uL — AB (ref 145–400)
RBC: 4.35 10*6/uL (ref 3.70–5.45)
RDW: 12.3 % (ref 11.2–14.5)
WBC: 6.1 10*3/uL (ref 3.9–10.3)

## 2015-01-26 LAB — COMPREHENSIVE METABOLIC PANEL (CC13)
ALT: 24 U/L (ref 0–55)
ANION GAP: 11 meq/L (ref 3–11)
AST: 21 U/L (ref 5–34)
Albumin: 3.5 g/dL (ref 3.5–5.0)
Alkaline Phosphatase: 86 U/L (ref 40–150)
BUN: 17.1 mg/dL (ref 7.0–26.0)
CHLORIDE: 103 meq/L (ref 98–109)
CO2: 27 mEq/L (ref 22–29)
Calcium: 9.6 mg/dL (ref 8.4–10.4)
Creatinine: 1.2 mg/dL — ABNORMAL HIGH (ref 0.6–1.1)
EGFR: 59 mL/min/{1.73_m2} — ABNORMAL LOW (ref >90)
Glucose: 107 mg/dl (ref 70–140)
Potassium: 4.6 mEq/L (ref 3.5–5.1)
Sodium: 142 mEq/L (ref 136–145)
TOTAL PROTEIN: 7.3 g/dL (ref 6.4–8.3)
Total Bilirubin: 0.4 mg/dL (ref 0.20–1.20)

## 2015-01-26 MED ORDER — HYDROXYUREA 500 MG PO CAPS: 500.0000 mg | ORAL_CAPSULE | Freq: Every day | ORAL | Status: DC

## 2015-01-26 NOTE — Progress Notes (Signed)
[facility] PROGRESS NOTE   Sara Greenland, MD [address] [place] [address]  DIAGNOSIS: Essential thrombocytosis  CC: follow up Essential Thrombocytosis  CURRENT THERAPY: Hydroxyurea 500 mg daily, started in 2010.  Aspirin 81 mg daily.  INTERVAL HISTORY:  Sara [name] 59 y.o. female with history of JAK positive Essential thrombocythemia presents for six month follow-up.  She was last seen by me three month ago. She is doing well, no medical events. She reports that  She had several syncope episodes when she was on hydrea 500mg  bid at beginning and it was subsequently changed back to 500mg  daily by Dr. Juliann Mule. She has been tolerating Hydrea 500 mg daily very well. She has history of seizure, has been following with her neurologist.   MEDICAL HISTORY: Past Medical History  Diagnosis Date  . Thrombocytosis   . Hypertension   . Diabetes mellitus   . Gout   . Renal artery stenosis 12/13/2010    PTCA and Stent - right  . Syncopal episodes   . PAD (peripheral artery disease)   . Hyperlipemia   . Morbid obesity   . LVH (left ventricular hypertrophy)     12/21/09 Echo -mild asymmetric LVH,EF =>55%  . OSA (obstructive sleep apnea)      ALLERGIES:  is allergic to sulfa antibiotics.  MEDICATIONS:    Medication List       This list is accurate as of: 01/26/15 11:59 PM.  Always use your most recent med list.               aspirin EC 81 MG tablet  Take 81 mg by mouth daily.     atorvastatin 10 MG tablet  Commonly known as:  LIPITOR  Take 10 mg by mouth every evening.     B COMPLEX PO  Take 1 tablet by mouth daily.     hydroxyurea 500 MG capsule  Commonly known as:  HYDREA  Take 1 capsule (500 mg total) by mouth daily. May take with food to minimize GI side effects.     Investigational - Study Medication  - Take 100 mg by mouth daily. Research study with Dr. Baird Cancer - pt may be receiving placebo or allopurinol  -  Protocol FIE-33_295  - [id number] 1 capsule  - [id number] 1 capsule  - Per pt - Allopurinol.     lacosamide 50 MG Tabs tablet  Commonly known as:  [name]  Take 1 tablet (50 mg total) by mouth 2 (two) times daily.     losartan 25 MG tablet  Commonly known as:  COZAAR  Take 25 mg by mouth daily.     nebivolol 5 MG tablet  Commonly known as:  BYSTOLIC  Take 1 tablet (5 mg total) by mouth daily.     Vitamin D3 2000 UNITS capsule  Take 1,000 Units by mouth daily.        SURGICAL HISTORY:  Past Surgical History  Procedure Laterality Date  . Breast reduction surgery  1995    S/P Bilateral breast reduction  . Cesarean section  1980, 1986    Times  Two.  . Breast cyst excision      S/P Benign Right Breast Cyst Removal.  . Partial hysterectomy  1994  . Extremity cyst excision  1993    left wrist  . Renal artery stent placement Right     REVIEW OF SYSTEMS:   Constitutional: Denies fevers, chills or abnormal weight loss Eyes: Denies  blurriness of vision Ears, nose, mouth, throat, and face: Denies mucositis or sore throat Respiratory: Denies cough, dyspnea or wheezes Cardiovascular: Denies palpitation, chest discomfort or lower extremity swelling Gastrointestinal:  Denies nausea, heartburn ; she has constipation.  Skin: Denies abnormal skin rashes Lymphatics: Denies new lymphadenopathy or easy bruising Neurological:Denies numbness, tingling or new weaknesses Behavioral/Psych: Mood is stable, no new changes  All other systems were reviewed with the patient and are negative.  PHYSICAL EXAMINATION: ECOG PERFORMANCE STATUS: 0  Blood pressure 163/79, pulse 66, temperature 97.8 F (36.6 C), temperature source Oral, resp. rate 18, height 5\' 2"  (1.575 m), weight 224 lb 14.4 oz (102.014 kg), SpO2 98 %.  GENERAL:alert, no distress and comfortable; obese pleasant middle-age woman SKIN: skin color, texture, turgor are normal, no rashes or significant lesions EYES: normal, Conjunctiva  are pink and non-injected, sclera clear OROPHARYNX:no exudate, no erythema and lips, buccal mucosa, and tongue normal  NECK: supple, thyroid normal size, non-tender, without nodularity LYMPH:  no palpable lymphadenopathy in the cervical, axillary or inguinal LUNGS: clear to auscultation and percussion with normal breathing effort HEART: regular rate & rhythm and no murmurs and no lower extremity edema ABDOMEN:abdomen soft,  Mildly tender in the epigastric and normal bowel sounds Musculoskeletal:no cyanosis of digits and no clubbing  NEURO: alert & oriented x 3 with fluent speech, no focal motor/sensory deficits   LABORATORY DATA:  CBC Latest Ref Rng 01/26/2015 10/27/2014 08/28/2014  WBC 3.9 - 10.3 10e3/uL 6.1 5.1 4.9  Hemoglobin 11.6 - 15.9 g/dL 14.2 13.8 13.7  Hematocrit 34.8 - 46.6 % 42.6 41.8 41.5  Platelets 145 - 400 10e3/uL 445(H) 427(H) 399    CMP Latest Ref Rng 01/26/2015 10/27/2014 05/07/2014  Glucose 70 - 140 mg/dl 107 90 96  BUN 7.0 - 26.0 mg/dL 17.1 21.1 14.9  Creatinine 0.6 - 1.1 mg/dL 1.2(H) 1.1 1.1  Sodium 136 - 145 mEq/L 142 143 142  Potassium 3.5 - 5.1 mEq/L 4.6 3.7 4.1  Chloride 96 - 112 mEq/L - - -  CO2 22 - 29 mEq/L 27 30(H) 27  Calcium 8.4 - 10.4 mg/dL 9.6 9.3 9.3  Total Protein 6.4 - 8.3 g/dL 7.3 7.0 -  Total Bilirubin 0.20 - 1.20 mg/dL 0.40 0.52 -  Alkaline Phos 40 - 150 U/L 86 86 -  AST 5 - 34 U/L 21 19 -  ALT 0 - 55 U/L 24 28 -   Pathology report 01/20/2009 BONE MARROW, RIGHT POSTERIOR ILIAC CREST, BIOPSY AND ASPIRATION: - CELLULAR MARROW WITH TRILINEAGE HEMATOPOIESIS AND MATURATION. - NO EVIDENCE OF EXCESS BLASTS.   BONE MARROW ASPIRATE: The aspirate material is composed of a few somewhat cellular bone marrow particles displaying a mixture of cell types. Erythroid and granulocytic series show orderly and progressive maturation. Megakaryocytes are abundant with predominantly normal morphology.  BONE MARROW CORE BIOPSY: The bone marrow core biopsy  has very prominent aspiration artifact. Sections show 40 to 60% cellularity with a mixture of cell types. The megakaryocytes are abundant with normal morphology. No megakaryocyte clustering is seen. The granulocytic series and erythroid series show orderly and progressive maturation. No excess blasts are seen.  Karyotype: 46,XX[20].nuc ish (ABL,BCR)x2  Interpretation: Cytogenetic Analysis:Normal: Cytogenetic analysis revealed the presence of normal female chromosomes with no observable clonal chromosomal abnormalities.  Molecular Cytogenetic Analysis FISH: Normal: The investigative technique of molecular cytogenetic analysis with DNA probes specific for the Major/Minor ABL (9q34) and BCR (22q11) genes revealed the lack of significant number of BCR/ABL fusion signals. These results are consistent  with a normal finding and a lack of the BCR/ABL fusion associated with CML/ALL.  RADIOGRAPHIC STUDIES:  DG CHEST 2 VIEW 01/05/2014 CLINICAL DATA: Dizzy. EXAM: CHEST 2 VIEW COMPARISON: DG CHEST 2 VIEW dated 07/20/2013 FINDINGS:  Mediastinum and hilar structures normal. Subsegmental atelectasis left lung base. No pleural effusion or pneumothorax. Heart size  normal. Degenerative changes thoracic spine IMPRESSION: Mild subsegmental atelectasis left lung base, otherwise negative chest.   ASSESSMENT: Essential thrombocythemia. Diagnosed in 2010. JAK2 mutation was present. The patient underwent a bone marrow aspirate and biopsy on 01/20/2009 which yielded a specimen that was suboptimal for evaluation. However, megakaryocytes were abundant with normal morphology. There was no clustering or excess blasts seen. Platelet count on 01/20/2009 was 881. The patient is being treated with aspirin and hydroxyurea.  PLAN:  1. Essential Thrombocythemia. JAK2 (+)  --I reviewed her above laboratory results and history extensively. I explained to her this chronic disease, not curable but treatable.  Small percentage patient will develop myelofibrosis or leukemia in late stage. The disease related risks are thrombosis, including stroke and heart attack. -Her platelet count has been above 400 in the past 4 months, we discussed the option of increased her Hydrea. Given her prior episode of syncope, I suggest her to increase to 1 tab a week for 2 weeks, if doing well, then increase 2 tab a week. She agrees.  -Continue aspirin.  2. Seizure -cont meds, follow up   Follow-up.  -lab in 6 weeks and 3 month -increse hydrea to 2 tab on Monday and Thursday, and 1 tab on the rest of week   --All questions were answered. The patient knows to call the clinic with any problems, questions or concerns. We can certainly see the patient much sooner if necessary.  --I spent 15 minutes counseling the patient face to face. The total time spent in the appointment was 25 minutes.    Truitt Merle  01/27/2015

## 2015-01-26 NOTE — Telephone Encounter (Signed)
Gave avs & calendar for May/July. °

## 2015-01-27 ENCOUNTER — Encounter: Payer: Self-pay | Admitting: Hematology

## 2015-01-28 ENCOUNTER — Encounter: Payer: Self-pay | Admitting: Hematology

## 2015-01-30 ENCOUNTER — Telehealth: Payer: Self-pay | Admitting: *Deleted

## 2015-01-30 NOTE — Telephone Encounter (Signed)
TC from pt requesting clarification of changes to Hydrea. Saw Dr. Burr Medico on Monday 01/26/15  Please advise

## 2015-01-30 NOTE — Telephone Encounter (Signed)
Called pt & she will add one extra tab of hydrea on Monday next week & if after 1-2 wks, no problems will increase to one extra hydrea tab on Mon & thurs.

## 2015-02-18 ENCOUNTER — Telehealth: Payer: Self-pay | Admitting: Neurology

## 2015-02-18 ENCOUNTER — Other Ambulatory Visit: Payer: Self-pay | Admitting: Neurology

## 2015-02-18 NOTE — Telephone Encounter (Signed)
Patient called regarding Rx. lacosamide ([name]) 50 MG TABS tablet stated that she needs a refill. Please call and advise.

## 2015-02-18 NOTE — Telephone Encounter (Signed)
Pharmacy sent a request for refill today at 1:25 pm, which has been forwarded to provider for approval.

## 2015-02-19 NOTE — Telephone Encounter (Signed)
Rx has been faxed.

## 2015-03-05 ENCOUNTER — Telehealth: Payer: Self-pay | Admitting: Cardiovascular Disease

## 2015-03-05 MED ORDER — NEBIVOLOL HCL 5 MG PO TABS: 5.0000 mg | ORAL_TABLET | Freq: Every day | ORAL | Status: DC

## 2015-03-05 NOTE — Telephone Encounter (Signed)
Pt would like some samples of Bystolic please. After 4,please call [phone].

## 2015-03-05 NOTE — Telephone Encounter (Signed)
PATIENT  AWARE--SAMPLES ARE AVAILABLE TO PICK UP 4 BOXES

## 2015-03-09 ENCOUNTER — Other Ambulatory Visit (HOSPITAL_BASED_OUTPATIENT_CLINIC_OR_DEPARTMENT_OTHER): Payer: BC Managed Care – PPO

## 2015-03-09 DIAGNOSIS — D473 Essential (hemorrhagic) thrombocythemia: Secondary | ICD-10-CM

## 2015-03-09 LAB — COMPREHENSIVE METABOLIC PANEL (CC13)
ALT: 23 U/L (ref 0–55)
ANION GAP: 9 meq/L (ref 3–11)
AST: 20 U/L (ref 5–34)
Albumin: 3.6 g/dL (ref 3.5–5.0)
Alkaline Phosphatase: 83 U/L (ref 40–150)
BUN: 18 mg/dL (ref 7.0–26.0)
CO2: 27 meq/L (ref 22–29)
CREATININE: 1 mg/dL (ref 0.6–1.1)
Calcium: 9 mg/dL (ref 8.4–10.4)
Chloride: 104 mEq/L (ref 98–109)
EGFR: 69 mL/min/{1.73_m2} — ABNORMAL LOW (ref >90)
Glucose: 84 mg/dl (ref 70–140)
Potassium: 3.9 mEq/L (ref 3.5–5.1)
SODIUM: 139 meq/L (ref 136–145)
TOTAL PROTEIN: 6.9 g/dL (ref 6.4–8.3)
Total Bilirubin: 0.42 mg/dL (ref 0.20–1.20)

## 2015-03-09 LAB — CBC WITH DIFFERENTIAL/PLATELET
BASO%: 1.1 % (ref 0.0–2.0)
BASOS ABS: 0.1 10*3/uL (ref 0.0–0.1)
EOS%: 3.8 % (ref 0.0–7.0)
Eosinophils Absolute: 0.2 10*3/uL (ref 0.0–0.5)
HCT: 41.5 % (ref 34.8–46.6)
HEMOGLOBIN: 13.7 g/dL (ref 11.6–15.9)
LYMPH#: 1.7 10*3/uL (ref 0.9–3.3)
LYMPH%: 29.6 % (ref 14.0–49.7)
MCH: 32.9 pg (ref 25.1–34.0)
MCHC: 33 g/dL (ref 31.5–36.0)
MCV: 99.9 fL (ref 79.5–101.0)
MONO#: 0.5 10*3/uL (ref 0.1–0.9)
MONO%: 8.6 % (ref 0.0–14.0)
NEUT#: 3.2 10*3/uL (ref 1.5–6.5)
NEUT%: 56.9 % (ref 38.4–76.8)
PLATELETS: 448 10*3/uL — AB (ref 145–400)
RBC: 4.16 10*6/uL (ref 3.70–5.45)
RDW: 13.3 % (ref 11.2–14.5)
WBC: 5.7 10*3/uL (ref 3.9–10.3)

## 2015-03-11 ENCOUNTER — Encounter: Payer: Self-pay | Admitting: Neurology

## 2015-03-11 ENCOUNTER — Ambulatory Visit (INDEPENDENT_AMBULATORY_CARE_PROVIDER_SITE_OTHER): Payer: BC Managed Care – PPO | Admitting: Neurology

## 2015-03-11 VITALS — BP 180/110 | HR 72 | Resp 18 | Ht 62.0 in | Wt 226.0 lb

## 2015-03-11 DIAGNOSIS — Z9989 Dependence on other enabling machines and devices: Principal | ICD-10-CM

## 2015-03-11 DIAGNOSIS — Z658 Other specified problems related to psychosocial circumstances: Secondary | ICD-10-CM | POA: Diagnosis not present

## 2015-03-11 DIAGNOSIS — G4733 Obstructive sleep apnea (adult) (pediatric): Secondary | ICD-10-CM | POA: Diagnosis not present

## 2015-03-11 DIAGNOSIS — E669 Obesity, unspecified: Secondary | ICD-10-CM | POA: Diagnosis not present

## 2015-03-11 DIAGNOSIS — F439 Reaction to severe stress, unspecified: Secondary | ICD-10-CM

## 2015-03-11 NOTE — Progress Notes (Signed)
Subjective:    [name] ID: Sara [name] is a 59 y.o. female.  HPI     Interim history:   Sara [name] is a 59 year old right-handed woman with an underlying medical history of thrombocytosis, hypertension, type 2 diabetes, gout, renal artery stenosis, s/p stent placement in 2013, peripheral artery disease, hyperlipidemia, obesity, left ventricular hypertrophy on echocardiogram, and prior diagnosis of obstructive sleep apnea, who presents for followup consultation of Sara [name] obstructive sleep apnea, treated with CPAP therapy. Sara [name] is unaccompanied today. I last saw Sara [name] on 03/10/2014, at which time Sara [name] reported doing well with CPAP. Sara [name] reported a staring spell and decreased responsiveness as witnessed by Sara [name] daughter and 2 nieces. They took Sara [name] to [facility] emergency room but because of weight time they actually left. Sara [name] was compliant with CPAP treatment. I suggested a one-year follow-up. Sara [name] was advised to follow-up with Dr. Jannifer [name] as scheduled. Today, 03/11/2015: I reviewed Sara [name] CPAP compliance data from 02/07/2015 through 03/08/2015 which is a total of 30 days during which time Sara [name] used Sara [name] machine 29 days with percent used days greater than 4 hours at 93%, indicating excellent compliance with an average usage of 6 hours and 9 minutes for all days, residual AHI low at 2 per hour, leak generally low with Sara 95th percentile at 11.9 L/m on a pressure of 9 cm with EPR of 2.  Today, 03/11/2015: Sara [name] reports doing well with respect to Sara [name] sleep. Sara [name] is compliant with treatment. Sara [name] has ongoing good results with Sara [name] sleep. Sara [name] had no recent syncopal spell. Today Sara [name] was rushing a little bit on Sara [name] blood pressure was up in Sara beginning. Upon recheck it was better. Sara [name] is going to try harder for weight loss. Sara [name] uses nasal pillows and just recently got supplies. Sara [name] was overdue for Sara [name] supplies. Sara [name] has a lot of stress. Sara [name] has a sister who lives with Sara [name], who is on disability. Sara [name] 14 year old  son has mental health issues and Sara [name] worries about him. Sara [name] works full-time. Sara [name] would be eligible for retirement but is not ready to take that step yet. Sara [name] has an appointment with our nurse practitioner, Jinny Blossom in July. Sara [name] has not had any recent seizures.  Previously:  Sara [name] has been on hydroxyurea for 2 years and has been on a study drug for gout for about a year.  . Sara [name] is unaccompanied today. I first met Sara [name] on 11/11/2013 at Sara request of Dr. Jannifer [name] at which time I suggested Sara [name] return for sleep study to get retested for sleep apnea and potentially treated with CPAP. Sara [name] had stopped using CPAP about a year prior. Sara [name] reported that CPAP was very cumbersome to use and Sara pressure felt too high. Sara [name] sleep study report from 11/26/2010, which was performed at [facility] [place] showed: Sleep efficiency of 54.95% with a sleep latency of 9 minutes and REM latency normal at 130 minutes. Sara [name] had a mildly elevated arousal index of 10.7 per hour. Sara [name] had 6 obstructive apneas and 57 hypopneas. Sara [name] overall AHI was 16.4 per hour, rising to 35.3 per hour and REM sleep. Sara average oxygen saturation was 93% and Sara [name] nadir was 81%. Mild snoring was reported. Sara [name] had mild periodic leg movements of sleep at 17.2 per hour with an associated arousal index of 3.4 per hour. Sara [name] had a split-night sleep study on 11/20/2013 and I went over Sara [name] test results with Sara [name] in detail today. Sara [name] baseline sleep efficiency was reduced at 57.2%  with a latency to sleep of 39.5 minutes and wake after sleep onset of and 15.3% of REM sleep with a normal REM latency. Sara [name] had no significant periodic leg movements or cardiac arrhythmias. Sara [name] total AHI was 73.9 per hour. Oxygen at baseline was 92%, nadir was 85%. Sara [name] was then titrated on CPAP and had an increased percentage of REM sleep at 27.9%. Average oxygen saturation was 91%, nadir was 85%. Sara [name] was titrated on CPAP from 4-9 cm and had an AHI of 2.9 events at Sara final pressure  with supine REM sleep achieved. Sara [name] did have mild periodic leg movements of 15.7 per hour with no significant arousals. I reviewed Sara [name]'s CPAP compliance data from 12/13/2013 to 01/11/2014, which is a total of 30 days, during which time Sara [name] used CPAP every day. Sara average usage for all days was 7 hours and 14 minutes. Sara percent used days greater than 4 hours was 100 %, indicating superb compliance. Sara residual AHI was 2 per hour, indicating an appropriate treatment pressure of 9 cwp with EPR of 2. I reviewed Sara [name] compliance data from 02/08/2014 through 03/09/2014 which is Sara last 30 days during which time Sara [name] uses CPAP every night. Percent used days greater than 4 hours was 90%, indicating very good compliance. Average usage was 7 hours and 10 minutes, residual AHI level at 1.7 per hour and leak was low at 8.6 at Sara 95th percentile.    Sara [name] Past Medical History Is Significant For: Past Medical History  Diagnosis Date  . Thrombocytosis   . Hypertension   . Diabetes mellitus   . Gout   . Renal artery stenosis 12/13/2010    PTCA and Stent - right  . Syncopal episodes   . PAD (peripheral artery disease)   . Hyperlipemia   . Morbid obesity   . LVH (left ventricular hypertrophy)     12/21/09 Echo -mild asymmetric LVH,EF =>55%  . OSA (obstructive sleep apnea)     Sara [name] Past Surgical History Is Significant For: Past Surgical History  Procedure Laterality Date  . Breast reduction surgery  1995    S/P Bilateral breast reduction  . Cesarean section  1980, 1986    Times  Two.  . Breast cyst excision      S/P Benign Right Breast Cyst Removal.  . Partial hysterectomy  1994  . Extremity cyst excision  1993    left wrist  . Renal artery stent placement Right     Sara [name] Family History Is Significant For: Family History  Problem Relation Age of Onset  . Breast cancer Maternal Aunt     2 maternal aunts had breast cancer.  Marland Kitchen Heart failure Mother   . Diabetes Father   . Diabetes  Sister   . Hypertension Sister   . Breast cancer Maternal Grandmother   . Hypertension Sister   . Hypertension Sister     Sara [name] Social History Is Significant For: History   Social History  . Marital Status: Married    Spouse Name: Juanda Crumble  . Number of Children: 2  . Years of Education: 16   Occupational History  . [employer].    Social History Main Topics  . Smoking status: Never Smoker   . Smokeless tobacco: Never Used  . Alcohol Use: No  . Drug Use: No  . Sexual Activity: Yes   Other Topics Concern  . None   Social History Narrative   [name] lives at husband and Sara [name] son, home with family.  [name] has two adult children.   [name] is not drinking any caffeine.   [name] is working full-time.   [name] has a college education.   [name] is right-handed.    Sara [name] Allergies Are:  Allergies  Allergen Reactions  . Sulfa Antibiotics Itching  :   Sara [name] Current Medications Are:  Outpatient Encounter Prescriptions as of 03/11/2015  Medication Sig  . aspirin EC 81 MG tablet Take 81 mg by mouth daily.  Marland Kitchen atorvastatin (LIPITOR) 10 MG tablet Take 10 mg by mouth every evening.   . B Complex Vitamins (B COMPLEX PO) Take 1 tablet by mouth daily.   . Cholecalciferol (VITAMIN D3) 2000 UNITS capsule Take 1,000 Units by mouth daily.   . hydroxyurea (HYDREA) 500 MG capsule Take 1 capsule (500 mg total) by mouth daily. May take with food to minimize GI side effects.  . lacosamide ([name]) 50 MG TABS tablet Take 1 tablet (50 mg total) by mouth 2 (two) times daily. ([name] taking differently: Take 50 mg by mouth 1 day or 1 dose. )  . losartan (COZAAR) 25 MG tablet Take 25 mg by mouth daily.  . nebivolol (BYSTOLIC) 5 MG tablet Take 1 tablet (5 mg total) by mouth daily.  . STUDY MEDICATION Take 100 mg by mouth daily. Research study with Dr. Baird Cancer - pt may be receiving placebo or allopurinol Protocol [id number] 1 capsule [id number] 1 capsule Per pt - Allopurinol.   No  facility-administered encounter medications on file as of 03/11/2015.  :  Review of Systems:  Out of a complete 14 point review of systems, all are reviewed and negative with Sara exception of these symptoms as listed below:   Review of Systems  All other systems reviewed and are negative.   Objective:  Neurologic Exam  Physical Exam Physical Examination:   Filed Vitals:   03/11/15 1133  BP: 180/110  Pulse: 72  Resp: 18   Sara [name] denies any shortness of breath or headache or chest pain. Upon recheck, Sara [name] blood pressure was 152/82.  General Examination: Sara [name] is a very pleasant 59 y.o. female in no acute distress. Sara [name] appears well-developed and well-nourished and very well groomed. Sara [name] is obese. Sara [name] is in good spirits today.   HEENT: Normocephalic, atraumatic, pupils are equal, round and reactive to light and accommodation. Funduscopic exam is normal with sharp disc margins noted. Extraocular tracking is good without limitation to gaze excursion or nystagmus noted. Normal smooth pursuit is noted. Hearing is grossly intact. Face is symmetric with normal facial animation and normal facial sensation. Speech is clear with no dysarthria noted. There is no hypophonia. There is no lip, neck/head, jaw or voice tremor. Neck is supple with full range of passive and active motion. There are no carotid bruits on auscultation. Oropharynx exam reveals: mild mouth dryness, adequate dental hygiene and moderate airway crowding, due to large tongue, elongated uvula and tonsils in place. Mallampati is class II. Tongue protrudes centrally and palate elevates symmetrically. Tonsils are 1+.   Chest: Clear to auscultation without wheezing, rhonchi or crackles noted.  Heart: S1+S2+0, regular and normal without murmurs, rubs or gallops noted.   Abdomen: Soft, non-tender and non-distended with normal bowel sounds appreciated on auscultation.  Extremities: There is no pitting edema in Sara distal lower  extremities bilaterally. Pedal pulses are intact.  Skin: Warm and dry without trophic changes noted. There are no varicose veins.  Musculoskeletal: exam reveals no obvious joint deformities, tenderness or joint swelling or erythema.  Neurologically:  Mental status: Sara [name] is awake, alert and oriented in all 4 spheres. Sara [name] memory, attention, language and knowledge are appropriate. There is no aphasia, agnosia, apraxia or anomia. Speech is clear with normal prosody and enunciation. Thought process is linear. Mood is congruent and affect is constricted.  Cranial nerves are as described above under HEENT exam. In addition, shoulder shrug is normal with equal shoulder height noted. Motor exam: Normal bulk, strength and tone is noted. There is no drift, tremor or rebound. Romberg is negative. Reflexes are 2+ throughout. Fine motor skills are intact with normal finger taps, normal hand movements, normal rapid alternating patting, normal foot taps and normal foot agility.  Cerebellar testing shows no dysmetria or intention tremor on finger to nose testing. Heel to shin is unremarkable bilaterally. There is no truncal or gait ataxia.  Sensory exam is intact to light touch, pinprick, vibration, temperature sense in Sara upper and lower extremities.  Gait, station and balance are unremarkable. No veering to one side is noted. No leaning to one side is noted. Posture is age-appropriate and stance is narrow based. No problems turning are noted. Sara [name] turns en bloc. Tandem walk is unremarkable.             Assessment and Plan:   In summary, CHERYL CHAY is a very pleasant 59 year old female with an underlying medical history of thrombocytosis, hypertension, type 2 diabetes, gout, renal artery stenosis, s/p stent placement in 2013, peripheral artery disease, hyperlipidemia, obesity, left ventricular hypertrophy on echocardiogram, with a history of OSA, now established on CPAP treatment at a pressure of 9  cwp. Sara [name] physical exam is stable and I reassured Sara [name] in that regard. We reviewed Sara [name] compliance data and Sara [name] is congratulated on Sara [name] excellent compliance. Sara [name] has ongoing good results with Sara use of CPAP therapy as far as Sara [name] sleep quality and daytime somnolence go. Sara [name] does have quite a few stressors. Sara [name] is encouraged to discuss this with Sara [name] primary care physician. Sara [name] has an appointment coming up with Dr. Baird Cancer soon. Sara [name] has not had any recent syncopal spell or seizure-like episodes. Sara [name] is compliant with Sara [name] medications. Sara [name] has an appointment with [facility] for routine follow-up in July which I asked Sara [name] to keep. As far as Sara [name] sleep apnea is concerned Sara [name] is doing rather well. Sara [name] is encouraged to continue to strive for weight loss.  We again talked about trying to maintaining a healthy lifestyle in general. I encouraged Sara [name] to eat healthy, exercise daily and keep well hydrated, to keep a scheduled bedtime and wake time routine, to not skip any meals and eat healthy snacks in between meals and to have protein with every meal. I stressed Sara importance of regular exercise.  I would like to see Sara [name] back in a year from now for routine sleep apnea checkup. I will renew Sara [name] CPAP supply order.   I answered all their questions today and Sara [name] was in agreement. Sara [name] is encouraged Sara [name] to call with any interim questions, concerns, problems or updates.  I spent 25 minutes in total face-to-face time with Sara [name], more than 50% of which was spent in counseling and coordination of care, reviewing test results, reviewing medication and discussing or reviewing Sara diagnosis of OSA and stress, Sara prognosis and treatment options.

## 2015-03-11 NOTE — Patient Instructions (Signed)
Keep up the good work! I will see you back in 12 months for sleep apnea check up, and please keep your appointment with [facility] in July.

## 2015-04-12 ENCOUNTER — Encounter (HOSPITAL_COMMUNITY): Payer: Self-pay | Admitting: *Deleted

## 2015-04-12 ENCOUNTER — Emergency Department (HOSPITAL_COMMUNITY)
Admission: EM | Admit: 2015-04-12 | Discharge: 2015-04-12 | Disposition: A | Discharge status: Home / Self Care | Payer: BC Managed Care – PPO | Attending: Emergency Medicine | Admitting: Emergency Medicine

## 2015-04-12 DIAGNOSIS — Y9289 Other specified places as the place of occurrence of the external cause: Secondary | ICD-10-CM | POA: Diagnosis not present

## 2015-04-12 DIAGNOSIS — Y998 Other external cause status: Secondary | ICD-10-CM | POA: Insufficient documentation

## 2015-04-12 DIAGNOSIS — Z79899 Other long term (current) drug therapy: Secondary | ICD-10-CM | POA: Insufficient documentation

## 2015-04-12 DIAGNOSIS — Z7982 Long term (current) use of aspirin: Secondary | ICD-10-CM | POA: Insufficient documentation

## 2015-04-12 DIAGNOSIS — W01198A Fall on same level from slipping, tripping and stumbling with subsequent striking against other object, initial encounter: Secondary | ICD-10-CM | POA: Insufficient documentation

## 2015-04-12 DIAGNOSIS — Y9389 Activity, other specified: Secondary | ICD-10-CM | POA: Diagnosis not present

## 2015-04-12 DIAGNOSIS — I10 Essential (primary) hypertension: Secondary | ICD-10-CM | POA: Insufficient documentation

## 2015-04-12 DIAGNOSIS — E785 Hyperlipidemia, unspecified: Secondary | ICD-10-CM | POA: Insufficient documentation

## 2015-04-12 DIAGNOSIS — Z8669 Personal history of other diseases of the nervous system and sense organs: Secondary | ICD-10-CM | POA: Diagnosis not present

## 2015-04-12 DIAGNOSIS — S00531A Contusion of lip, initial encounter: Secondary | ICD-10-CM | POA: Insufficient documentation

## 2015-04-12 DIAGNOSIS — S0083XA Contusion of other part of head, initial encounter: Secondary | ICD-10-CM

## 2015-04-12 DIAGNOSIS — Z862 Personal history of diseases of the blood and blood-forming organs and certain disorders involving the immune mechanism: Secondary | ICD-10-CM | POA: Insufficient documentation

## 2015-04-12 DIAGNOSIS — E119 Type 2 diabetes mellitus without complications: Secondary | ICD-10-CM | POA: Diagnosis not present

## 2015-04-12 DIAGNOSIS — G40A09 Absence epileptic syndrome, not intractable, without status epilepticus: Secondary | ICD-10-CM

## 2015-04-12 DIAGNOSIS — R55 Syncope and collapse: Secondary | ICD-10-CM | POA: Diagnosis present

## 2015-04-12 DIAGNOSIS — W19XXXA Unspecified fall, initial encounter: Secondary | ICD-10-CM

## 2015-04-12 LAB — CBC
HCT: 41.1 % (ref 36.0–46.0)
Hemoglobin: 13.8 g/dL (ref 12.0–15.0)
MCH: 33.5 pg (ref 26.0–34.0)
MCHC: 33.6 g/dL (ref 30.0–36.0)
MCV: 99.8 fL (ref 78.0–100.0)
Platelets: 411 10*3/uL — ABNORMAL HIGH (ref 150–400)
RBC: 4.12 MIL/uL (ref 3.87–5.11)
RDW: 13 % (ref 11.5–15.5)
WBC: 5.4 10*3/uL (ref 4.0–10.5)

## 2015-04-12 LAB — BASIC METABOLIC PANEL
ANION GAP: 7 (ref 5–15)
BUN: 16 mg/dL (ref 6–20)
CALCIUM: 9.1 mg/dL (ref 8.9–10.3)
CO2: 30 mmol/L (ref 22–32)
Chloride: 102 mmol/L (ref 101–111)
Creatinine, Ser: 1.18 mg/dL — ABNORMAL HIGH (ref 0.44–1.00)
GFR, EST AFRICAN AMERICAN: 57 mL/min — AB (ref >60)
GFR, EST NON AFRICAN AMERICAN: 49 mL/min — AB (ref >60)
Glucose, Bld: 130 mg/dL — ABNORMAL HIGH (ref 65–99)
Potassium: 4.3 mmol/L (ref 3.5–5.1)
SODIUM: 139 mmol/L (ref 135–145)

## 2015-04-12 LAB — CBG MONITORING, ED: Glucose-Capillary: 135 mg/dL — ABNORMAL HIGH (ref 65–99)

## 2015-04-12 NOTE — ED Provider Notes (Signed)
CSN: [id number]     Arrival date & time 04/12/15  1628 History   First MD Initiated Contact with Patient 04/12/15 1747     Chief Complaint  Patient presents with  . Fall  . Near Syncope     (Consider location/radiation/quality/duration/timing/severity/associated sxs/prior Treatment) Patient is a 59 y.o. female presenting with fall and near-syncope. The history is provided by the patient.  Fall This is a recurrent problem. Pertinent negatives include no chest pain, no abdominal pain, no headaches and no shortness of breath.  Near Syncope Pertinent negatives include no chest pain, no abdominal pain, no headaches and no shortness of breath.   patient has history of absence seizures and syncope with it. She was eating lunch when she began to feel strange. States she got up to go to the bathroom and then she passed out. She hit her lip. She was unconscious and then confused. This is a typical syncopal episode that she has had with her absence seizures. Has a swelling in her lip. No fevers. No chest pain. Do not feel her heart racing. No abdominal pain. She is on [name] and is supposed be taking it twice a day but has only been taking one today.   Past Medical History  Diagnosis Date  . Thrombocytosis   . Hypertension   . Diabetes mellitus   . Gout   . Renal artery stenosis 12/13/2010    PTCA and Stent - right  . Syncopal episodes   . PAD (peripheral artery disease)   . Hyperlipemia   . Morbid obesity   . LVH (left ventricular hypertrophy)     12/21/09 Echo -mild asymmetric LVH,EF =>55%  . OSA (obstructive sleep apnea)    Past Surgical History  Procedure Laterality Date  . Breast reduction surgery  1995    S/P Bilateral breast reduction  . Cesarean section  1980, 1986    Times  Two.  . Breast cyst excision      S/P Benign Right Breast Cyst Removal.  . Partial hysterectomy  1994  . Extremity cyst excision  1993    left wrist  . Renal artery stent placement Right    Family  History  Problem Relation Age of Onset  . Breast cancer Maternal Aunt     2 maternal aunts had breast cancer.  Marland Kitchen Heart failure Mother   . Diabetes Father   . Diabetes Sister   . Hypertension Sister   . Breast cancer Maternal Grandmother   . Hypertension Sister   . Hypertension Sister    History  Substance Use Topics  . Smoking status: Never Smoker   . Smokeless tobacco: Never Used  . Alcohol Use: No   OB History    No data available     Review of Systems  Constitutional: Negative for activity change and appetite change.  Eyes: Negative for pain.  Respiratory: Negative for chest tightness and shortness of breath.   Cardiovascular: Positive for near-syncope. Negative for chest pain and leg swelling.  Gastrointestinal: Negative for nausea, abdominal pain and diarrhea.  Genitourinary: Negative for flank pain.  Musculoskeletal: Negative for back pain and neck stiffness.  Skin: Positive for wound. Negative for rash.  Neurological: Positive for syncope. Negative for weakness, numbness and headaches.      Allergies  Sulfa antibiotics  Home Medications   Prior to Admission medications   Medication Sig Start Date End Date Taking? Authorizing Provider  aspirin EC 81 MG tablet Take 81 mg by mouth daily.  Yes Historical Provider, MD  atorvastatin (LIPITOR) 10 MG tablet Take 10 mg by mouth every evening.    Yes Historical Provider, MD  B Complex Vitamins (B COMPLEX PO) Take 1 tablet by mouth daily.    Yes Historical Provider, MD  Cholecalciferol (VITAMIN D3) 2000 UNITS capsule Take 2,000 Units by mouth daily.    Yes Historical Provider, MD  hydroxyurea (HYDREA) 500 MG capsule Take 1 capsule (500 mg total) by mouth daily. May take with food to minimize GI side effects. Patient taking differently: Take 500 mg by mouth daily. TAKES 1000MG  ON MON AND THURS TAKES 500MG  ALL OTHER DAYS May take with food to minimize GI side effects. 01/26/15  Yes Truitt Merle, MD  lacosamide ([name]) 50 MG  TABS tablet Take 1 tablet (50 mg total) by mouth 2 (two) times daily. Patient taking differently: Take 50 mg by mouth every evening.  02/18/15  Yes [name], MD  losartan (COZAAR) 25 MG tablet Take 25 mg by mouth daily.   Yes Historical Provider, MD  nebivolol (BYSTOLIC) 5 MG tablet Take 1 tablet (5 mg total) by mouth daily. 03/05/15  Yes Mihai Croitoru, MD  STUDY MEDICATION Take 100 mg by mouth daily. Research study with Dr. Baird Cancer - pt may be receiving placebo or allopurinol Protocol [id number] 1 capsule [id number] 1 capsule Per pt - Allopurinol.   Yes Historical Provider, MD   BP 158/86 mmHg  Pulse 77  Temp(Src) 98.2 F (36.8 C) (Oral)  Resp 16  SpO2 100% Physical Exam  Constitutional: She is oriented to person, place, and time. She appears well-developed and well-nourished.  HENT:  Head: Normocephalic.  Hematoma to left lower lip. No tenderness or jaw.  Eyes: EOM are normal. Pupils are equal, round, and reactive to light.  Neck: Normal range of motion. Neck supple.  Cardiovascular: Normal rate, regular rhythm and normal heart sounds.   No murmur heard. Pulmonary/Chest: Effort normal and breath sounds normal. No respiratory distress. She has no wheezes. She has no rales.  Abdominal: Soft. Bowel sounds are normal. She exhibits no distension. There is no tenderness.  Musculoskeletal: Normal range of motion.  Neurological: She is alert and oriented to person, place, and time. No cranial nerve deficit.  Skin: Skin is warm and dry.  Psychiatric: Her speech is normal.  Nursing note and vitals reviewed.   ED Course  Procedures (including critical care time) Labs Review Labs Reviewed  BASIC METABOLIC PANEL - Abnormal; Notable for the following:    Glucose, Bld 130 (*)    Creatinine, Ser 1.18 (*)    GFR calc non Af Amer 49 (*)    GFR calc Af Amer 57 (*)    All other components within normal limits  CBC - Abnormal; Notable for the following:    Platelets 411 (*)     All other components within normal limits  CBG MONITORING, ED - Abnormal; Notable for the following:    Glucose-Capillary 135 (*)    All other components within normal limits    Imaging Review No results found.   EKG Interpretation None      MDM   Final diagnoses:  Typical absence seizures  Fall, initial encounter  Facial contusion, initial encounter    patient with fall. Likely from absence seizure. Has not been taking her medication as prescribed. Will increase the [name] and have her follow with her neurologist.  Davonna Belling, MD 04/12/15 1944

## 2015-04-12 NOTE — ED Notes (Signed)
Report given to Wendy, RN.

## 2015-04-12 NOTE — Discharge Instructions (Signed)
Take the [name] twice a day and follow-up with Dr. Jannifer Franklin  Seizure, Adult A seizure is abnormal electrical activity in the brain. Seizures usually last from 30 seconds to 2 minutes. There are various types of seizures. Before a seizure, you may have a warning sensation (aura) that a seizure is about to occur. An aura may include the following symptoms:   Fear or anxiety.  Nausea.  Feeling like the room is spinning (vertigo).  Vision changes, such as seeing flashing lights or spots. Common symptoms during a seizure include:  A change in attention or behavior (altered mental status).  Convulsions with rhythmic jerking movements.  Drooling.  Rapid eye movements.  Grunting.  Loss of bladder and bowel control.  Bitter taste in the mouth.  Tongue biting. After a seizure, you may feel confused and sleepy. You may also have an injury resulting from convulsions during the seizure. HOME CARE INSTRUCTIONS   If you are given medicines, take them exactly as prescribed by your health care provider.  Keep all follow-up appointments as directed by your health care provider.  Do not swim or drive or engage in risky activity during which a seizure could cause further injury to you or others until your health care provider says it is OK.  Get adequate rest.  Teach friends and family what to do if you have a seizure. They should:  Lay you on the ground to prevent a fall.  Put a cushion under your head.  Loosen any tight clothing around your neck.  Turn you on your side. If vomiting occurs, this helps keep your airway clear.  Stay with you until you recover.  Know whether or not you need emergency care. SEEK IMMEDIATE MEDICAL CARE IF:  The seizure lasts longer than 5 minutes.  The seizure is severe or you do not wake up immediately after the seizure.  You have an altered mental status after the seizure.  You are having more frequent or worsening seizures. Someone should  drive you to the emergency department or call local emergency services (911 in [place] MAKE SURE YOU:  Understand these instructions.  Will watch your condition.  Will get help right away if you are not doing well or get worse. Document Released: 10/07/2000 Document Revised: 07/31/2013 Document Reviewed: 05/22/2013 [facility] Patient Information 2015 [place], [place]. This information is not intended to replace advice given to you by your health care provider. Make sure you discuss any questions you have with your health care provider.

## 2015-04-12 NOTE — ED Notes (Signed)
[place]. Patient has hx of syncopal episodes, unwitnessed fall. Pt appears to have fallen forward and hit her head on a plastic stand. Pt has swelling to lower lip and redness to forehead. Pt is AAOX4 at this time and in NAD. Pt neck and back palpated along spine, pt denies pain. Pt denies being on any blood thinners. Pt lifted onto stretcher by backboard and placed in B15.

## 2015-04-12 NOTE — ED Notes (Signed)
Pt in from [place] & was reported to have fallen & hit her head, pt brought to our dept on a LSB in head blocks, pt A&O x4, moves all extremities, pt has swollen R lower lip, pt has reddened area to mid forehead, pt reports hx of the same when taking HTN meds, family at bedside

## 2015-04-15 ENCOUNTER — Telehealth: Payer: Self-pay | Admitting: Neurology

## 2015-04-15 NOTE — Telephone Encounter (Signed)
Called patient and left her a message to give me a call back.

## 2015-04-15 NOTE — Telephone Encounter (Signed)
Patient called stating she had a seizure on Sunday while visiting family members at hospital . She states she took 1/2 blood pressure pill about 5-10 min later she had a seizure.  She has followed up with PCP on Monday. Please call and advise. Patient can be reached at [phone]. She is requesting to speak with Ward Givens.

## 2015-04-15 NOTE — Telephone Encounter (Signed)
Noted. I will be happy to see her tomorrow.

## 2015-04-15 NOTE — Telephone Encounter (Signed)
Patient called back and stated she was at the ER this weekend because she had a seizure. Patient stated she went to see Dr. Lynder Parents her PCP this week and Dr. Baird Cancer wanted her to follow up with [place]. Patient states she didn't miss any of her [name] 50 mg twice daily. Patient states she has been under a lot of stress sick family members and working ten hour day's.   Patient was at work today she was scheduled with Megan July 5th . I offered patient apt today patient could not come. Patient is scheduled for June 23 rd   with [facility].

## 2015-04-16 ENCOUNTER — Encounter: Payer: Self-pay | Admitting: Adult Health

## 2015-04-16 ENCOUNTER — Ambulatory Visit (INDEPENDENT_AMBULATORY_CARE_PROVIDER_SITE_OTHER): Payer: BC Managed Care – PPO | Admitting: Adult Health

## 2015-04-16 VITALS — BP 148/74 | HR 72 | Ht 61.0 in | Wt 240.0 lb

## 2015-04-16 DIAGNOSIS — R55 Syncope and collapse: Secondary | ICD-10-CM | POA: Diagnosis not present

## 2015-04-16 DIAGNOSIS — R569 Unspecified convulsions: Secondary | ICD-10-CM | POA: Diagnosis not present

## 2015-04-16 NOTE — Patient Instructions (Signed)
Begin taking [name] 50 mg twice a day.  If you have any seizure events please let us know.

## 2015-04-16 NOTE — Progress Notes (Signed)
PATIENT: Sara [name] DOB: [birth year]  REASON FOR VISIT: follow up- seizure HISTORY FROM: patient  HISTORY OF PRESENT ILLNESS:  Sara [name] is a 59 year old female with a history of seizures. She returns today for follow-up. She is currently taking [name] 50 mg twice a day. She reports that she had a seizure over the weekend. She states that  She was at [facility] and she started to feel strange she got up to go the bathroom and that is when she fell to the floor.  She did go to the emergency room. She sustained a bruise to the bottom lip and a laceration on the left leg. Patient states that she was only taking one tablet of [name] at bedtime. She does state that she's been under a lot of stress with sick family members and working 10 hour days.  When taken appropriately [name] has worked well for the patient.  The patient does not like to take medication. She states this is why she was only taking one tablet. She is also felt that she was doing well with one tablet and felt that this was sufficient.  Patient also felt that losartan and atorvastatin may be causing her seizures. Her primary care has taken her off of these medications.She returns today for an evaluation.   HISTORY 10/22/14: Sara [name] is a 59 year old female with a history of seizures. She returns today for follow-up. She is currently taking [name] 50 mg twice a day. She states that she has not had any additional seizure episodes. She has tried taking Keppra in the past but was unable to tolerate it due to drowsiness. The patient is currently still dieting. She states over the holidays she was extremely stressed which caused her to fatigue really quickly. Overall she feels that the vimpat is working well for her.   HISTORY 07/16/14: Sara [name] is a 59 year old female with a history of seizures. She was taken to the hospital on 9/22 for a seizure. She states that she collapsed while shopping. She states that the night before she had a  staring spell that lasted less than one minute. She thought it was due to not eating much. The next afternoon her and her husband went to [employer] to shop. She states that she started feeling funny and apparently collapsed. The staff at the store called EMS. Her blood sugar and BP was normal. Her husband was not with her when this happened. She is currently taking Keppra 500 mg 1/2 tablet at night. She can't take higher doses of Keppra due to drowsiness. The hospital physician started [name]. She is not operating a motor vehicle. She returns today for evaluation.  HISTORY 04/22/14: 59 year old female with a history of seizures. She was recently seen in our office on 03/28/2014. Since then she was taken to the emergency room because she felt "funny" but reports she did not have a seizure. The patient was taking Keppra half a tablet daily. This was not the prescribed dosage. The patient should be taken half a tablet twice a day for 2 weeks then increasing to one tablet twice a day. Patient states that she can't take 1 whole tablet because it makes her sleepy so she only takes 1/2 tablet at night. Patient states that her PCP and hematologist have changed her BP medication and reduced her hydrea. She reports she has not had a seizure since starting Keppra. Her husband agrees with that.  REVIEW OF SYSTEMS: Out of a complete  14 system review of symptoms, the patient complains only of the following symptoms, and all other reviewed systems are negative.   seizure, passing out  ALLERGIES: Allergies  Allergen Reactions  . Sulfa Antibiotics Itching    HOME MEDICATIONS: Outpatient Prescriptions Prior to Visit  Medication Sig Dispense Refill  . aspirin EC 81 MG tablet Take 81 mg by mouth daily.    Marland Kitchen atorvastatin (LIPITOR) 10 MG tablet Take 10 mg by mouth every evening.     . B Complex Vitamins (B COMPLEX PO) Take 1 tablet by mouth daily.     . Cholecalciferol (VITAMIN D3) 2000 UNITS capsule Take 2,000 Units  by mouth daily.     . hydroxyurea (HYDREA) 500 MG capsule Take 1 capsule (500 mg total) by mouth daily. May take with food to minimize GI side effects. (Patient taking differently: Take 500 mg by mouth daily. TAKES 1000MG  ON MON AND THURS TAKES 500MG  ALL OTHER DAYS May take with food to minimize GI side effects.) 40 capsule 3  . lacosamide ([name]) 50 MG TABS tablet Take 1 tablet (50 mg total) by mouth 2 (two) times daily. (Patient taking differently: Take 50 mg by mouth every evening. ) 180 tablet 1  . losartan (COZAAR) 25 MG tablet Take 25 mg by mouth daily.    . nebivolol (BYSTOLIC) 5 MG tablet Take 1 tablet (5 mg total) by mouth daily. 28 tablet 0  . STUDY MEDICATION Take 100 mg by mouth daily. Research study with Dr. Baird Cancer - pt may be receiving placebo or allopurinol Protocol [id number] 1 capsule [id number] 1 capsule Per pt - Allopurinol.     No facility-administered medications prior to visit.    PAST MEDICAL HISTORY: Past Medical History  Diagnosis Date  . Thrombocytosis   . Hypertension   . Diabetes mellitus   . Gout   . Renal artery stenosis 12/13/2010    PTCA and Stent - right  . Syncopal episodes   . PAD (peripheral artery disease)   . Hyperlipemia   . Morbid obesity   . LVH (left ventricular hypertrophy)     12/21/09 Echo -mild asymmetric LVH,EF =>55%  . OSA (obstructive sleep apnea)     PAST SURGICAL HISTORY: Past Surgical History  Procedure Laterality Date  . Breast reduction surgery  1995    S/P Bilateral breast reduction  . Cesarean section  1980, 1986    Times  Two.  . Breast cyst excision      S/P Benign Right Breast Cyst Removal.  . Partial hysterectomy  1994  . Extremity cyst excision  1993    left wrist  . Renal artery stent placement Right     FAMILY HISTORY: Family History  Problem Relation Age of Onset  . Breast cancer Maternal Aunt     2 maternal aunts had breast cancer.  Marland Kitchen Heart failure Mother   . Diabetes Father   . Diabetes  Sister   . Hypertension Sister   . Breast cancer Maternal Grandmother   . Hypertension Sister   . Hypertension Sister     SOCIAL HISTORY: History   Social History  . Marital Status: Married    Spouse Name: Juanda Crumble  . Number of Children: 2  . Years of Education: 16   Occupational History  . [employer].    Social History Main Topics  . Smoking status: Never Smoker   . Smokeless tobacco: Never Used  . Alcohol Use: No  . Drug Use: No  . Sexual Activity:  Yes   Other Topics Concern  . Not on file   Social History Narrative   Patient lives at husband and her son, home with family.   Patient has two adult children.   Patient is not drinking any caffeine.   Patient is working full-time.   Patient has a college education.   Patient is right-handed.      PHYSICAL EXAM  Filed Vitals:   04/16/15 1552  BP: 148/74  Pulse: 72  Height: 5\' 1"  (1.549 m)  Weight: 240 lb (108.863 kg)   Body mass index is 45.37 kg/(m^2).  Generalized: Well developed, in no acute distress   Neurological examination  Mentation: Alert oriented to time, place, history taking. Follows all commands speech and language fluent Cranial nerve II-XII: Pupils were equal round reactive to light. Extraocular movements were full, visual field were full on confrontational test. Facial sensation and strength were normal. Uvula tongue midline. Head turning and shoulder shrug  were normal and symmetric. Motor: The motor testing reveals 5 over 5 strength of all 4 extremities. Good symmetric motor tone is noted throughout.  Sensory: Sensory testing is intact to soft touch on all 4 extremities. No evidence of extinction is noted.  Coordination: Cerebellar testing reveals good finger-nose-finger and heel-to-shin bilaterally.  Gait and station: Gait is normal. Tandem gait is normal. Romberg is negative. No drift is seen.  Reflexes: Deep tendon reflexes are symmetric and normal bilaterally.    DIAGNOSTIC  DATA (LABS, IMAGING, TESTING) - I reviewed patient records, labs, notes, testing and imaging myself where available.  Lab Results  Component Value Date   WBC 5.4 04/12/2015   HGB 13.8 04/12/2015   HCT 41.1 04/12/2015   MCV 99.8 04/12/2015   PLT 411* 04/12/2015      Component Value Date/Time   NA 139 04/12/2015 1733   NA 139 03/09/2015 1131   K 4.3 04/12/2015 1733   K 3.9 03/09/2015 1131   CL 102 04/12/2015 1733   CL 102 03/19/2013 1535   CO2 30 04/12/2015 1733   CO2 27 03/09/2015 1131   GLUCOSE 130* 04/12/2015 1733   GLUCOSE 84 03/09/2015 1131   GLUCOSE 117* 03/19/2013 1535   BUN 16 04/12/2015 1733   BUN 18.0 03/09/2015 1131   CREATININE 1.18* 04/12/2015 1733   CREATININE 1.0 03/09/2015 1131   CALCIUM 9.1 04/12/2015 1733   CALCIUM 9.0 03/09/2015 1131   PROT 6.9 03/09/2015 1131   PROT 6.9 04/14/2014 0002   ALBUMIN 3.6 03/09/2015 1131   ALBUMIN 3.5 04/14/2014 0002   AST 20 03/09/2015 1131   AST 17 04/14/2014 0002   ALT 23 03/09/2015 1131   ALT 22 04/14/2014 0002   ALKPHOS 83 03/09/2015 1131   ALKPHOS 84 04/14/2014 0002   BILITOT 0.42 03/09/2015 1131   BILITOT 0.3 04/14/2014 0002   GFRNONAA 60* 04/12/2015 1733   GFRAA 86* 04/12/2015 1733       ASSESSMENT AND PLAN 59 y.o. year old female  has a past medical history of Thrombocytosis; Hypertension; Diabetes mellitus; Gout; Renal artery stenosis (12/13/2010); Syncopal episodes; PAD (peripheral artery disease); Hyperlipemia; Morbid obesity; LVH (left ventricular hypertrophy); and OSA (obstructive sleep apnea). here with:  1.  Seizures  Patient reports that she had a seizure over the weekend. The patient is encouraged to take the medication as prescribed. She should take [name] 50 mg twice a day. If she has any additional seizures this will be increased. I have advised the patient that she  Should be getting adequate  sleep as sleep deprivation can lower her seizure threshold.  I have also talked to the patient about  relaxation techniques to help control her stress level.  If she has any additional seizures she she'll let us know. Otherwise she will follow-up in 6 months or sooner if needed.     Ward Givens, MSN, NP-C 04/16/2015, 3:41 PM [facility] [address], [place], [place] [address] [phone]  Note: This document was prepared with digital dictation and possible smart phrase technology. Any transcriptional errors that result from this process are unintentional.

## 2015-04-17 NOTE — Progress Notes (Signed)
I have read the note, and I agree with the clinical assessment and plan.  Tasheika Kitzmiller KEITH   

## 2015-04-20 ENCOUNTER — Other Ambulatory Visit: Payer: Self-pay

## 2015-04-21 ENCOUNTER — Telehealth: Payer: Self-pay | Admitting: Neurology

## 2015-04-21 ENCOUNTER — Telehealth: Payer: Self-pay | Admitting: Cardiovascular Disease

## 2015-04-21 NOTE — Telephone Encounter (Signed)
I called the patient and left a message for her to call our office.

## 2015-04-21 NOTE — Telephone Encounter (Signed)
Spoke with pt, she is trying to look over her medications because she is having seizures again. Explained discussed bystolic with kristin alvstad pharm md, there is no indication that beta-blockers cause seizures or interact with seizure medications. Patient voiced understanding

## 2015-04-21 NOTE — Telephone Encounter (Signed)
Called and spoke to patient and she had another episode today. Patient states she is working all half days this week. Patient just wants Sara [name] to be ware.

## 2015-04-21 NOTE — Telephone Encounter (Signed)
Patient called stating she started taking the lacosamide ([name]) 50 MG TABS tablet 2x day last Friday. She states last night she had another episode. She doesn't remember what happened but her husband came to the bathroom to check on her and she was confused. Please call and advise. Patient can be reached at [phone] umtil 11 am and home is [phone].

## 2015-04-21 NOTE — Telephone Encounter (Signed)
Pt c/o medication issue:  1. Name of Medication: Bystolic 5 mg  2. How are you currently taking this medication (dosage and times per day)? 1 tab po  once a day   3. Are you having a reaction (difficulty breathing--STAT)? Seizures   4. What is your medication issue? This medication is inducing seizures and she would like to know if she needs to continue taking this medication.

## 2015-04-22 NOTE — Telephone Encounter (Signed)
I called the patient again today- no answer- left voice mail for her to call the office.

## 2015-04-23 ENCOUNTER — Telehealth: Payer: Self-pay | Admitting: Cardiovascular Disease

## 2015-04-23 MED ORDER — NEBIVOLOL HCL 5 MG PO TABS: 5.0000 mg | ORAL_TABLET | Freq: Every day | ORAL | Status: DC

## 2015-04-23 NOTE — Telephone Encounter (Signed)
Samples provided for patient at front desk, patient informed.

## 2015-04-23 NOTE — Telephone Encounter (Signed)
Patient calling the office for samples of medication:   1.  What medication and dosage are you requesting samples for? Bystolic  2.  Are you currently out of this medication?yes  3. Are you requesting samples to get you through until a mail order prescription arrives?no

## 2015-04-24 ENCOUNTER — Ambulatory Visit (HOSPITAL_BASED_OUTPATIENT_CLINIC_OR_DEPARTMENT_OTHER): Payer: BC Managed Care – PPO | Admitting: Hematology

## 2015-04-24 ENCOUNTER — Telehealth: Payer: Self-pay | Admitting: Hematology

## 2015-04-24 ENCOUNTER — Encounter: Payer: Self-pay | Admitting: Hematology

## 2015-04-24 ENCOUNTER — Other Ambulatory Visit (HOSPITAL_BASED_OUTPATIENT_CLINIC_OR_DEPARTMENT_OTHER): Payer: BC Managed Care – PPO

## 2015-04-24 VITALS — BP 152/76 | HR 65 | Temp 98.0°F | Resp 18 | Ht 61.0 in | Wt 225.5 lb

## 2015-04-24 DIAGNOSIS — R55 Syncope and collapse: Secondary | ICD-10-CM

## 2015-04-24 DIAGNOSIS — R569 Unspecified convulsions: Secondary | ICD-10-CM | POA: Diagnosis not present

## 2015-04-24 DIAGNOSIS — D473 Essential (hemorrhagic) thrombocythemia: Secondary | ICD-10-CM | POA: Diagnosis not present

## 2015-04-24 LAB — CBC WITH DIFFERENTIAL/PLATELET
BASO%: 1 % (ref 0.0–2.0)
Basophils Absolute: 0.1 10*3/uL (ref 0.0–0.1)
EOS%: 4 % (ref 0.0–7.0)
Eosinophils Absolute: 0.2 10*3/uL (ref 0.0–0.5)
HCT: 39.8 % (ref 34.8–46.6)
HGB: 13.3 g/dL (ref 11.6–15.9)
LYMPH#: 1.7 10*3/uL (ref 0.9–3.3)
LYMPH%: 32.9 % (ref 14.0–49.7)
MCH: 33.6 pg (ref 25.1–34.0)
MCHC: 33.4 g/dL (ref 31.5–36.0)
MCV: 100.4 fL (ref 79.5–101.0)
MONO#: 0.5 10*3/uL (ref 0.1–0.9)
MONO%: 8.7 % (ref 0.0–14.0)
NEUT#: 2.8 10*3/uL (ref 1.5–6.5)
NEUT%: 53.4 % (ref 38.4–76.8)
Platelets: 432 10*3/uL — ABNORMAL HIGH (ref 145–400)
RBC: 3.96 10*6/uL (ref 3.70–5.45)
RDW: 13.8 % (ref 11.2–14.5)
WBC: 5.2 10*3/uL (ref 3.9–10.3)

## 2015-04-24 LAB — COMPREHENSIVE METABOLIC PANEL (CC13)
ALT: 20 U/L (ref 0–55)
AST: 19 U/L (ref 5–34)
Albumin: 3.7 g/dL (ref 3.5–5.0)
Alkaline Phosphatase: 79 U/L (ref 40–150)
Anion Gap: 10 mEq/L (ref 3–11)
BILIRUBIN TOTAL: 0.58 mg/dL (ref 0.20–1.20)
BUN: 14.5 mg/dL (ref 7.0–26.0)
CHLORIDE: 104 meq/L (ref 98–109)
CO2: 26 meq/L (ref 22–29)
Calcium: 9.6 mg/dL (ref 8.4–10.4)
Creatinine: 1.1 mg/dL (ref 0.6–1.1)
EGFR: 64 mL/min/{1.73_m2} — AB (ref >90)
Glucose: 89 mg/dl (ref 70–140)
POTASSIUM: 4.4 meq/L (ref 3.5–5.1)
SODIUM: 141 meq/L (ref 136–145)
TOTAL PROTEIN: 6.8 g/dL (ref 6.4–8.3)

## 2015-04-24 NOTE — Progress Notes (Signed)
[facility] OFFICE PROGRESS NOTE   [name], MD [address] [place] [address]  DIAGNOSIS: Essential thrombocytosis  CC: follow up Essential Thrombocytosis  CURRENT THERAPY: Hydroxyurea 500 mg daily, started in 2010., increased to hydrea 1000mg  daily on Monday and  Thursday, and 500mg  daily for the rest of week in 01/2015,  Aspirin 81 mg daily.  INTERVAL HISTORY:  Sara [name] 59 y.o. female with history of JAK positive Essential thrombocythemia presents for follow up. She was last seen by me 3 month ago. She had an episode of syncope last week, which happended when she walked in a public restroom. She felt on the ground and had a bruise on the left leg. She did have similar episodes a few times in the past few years. She is very frustrated and upset that no one can tell her what that happens.  MEDICAL HISTORY: Past Medical History  Diagnosis Date  . Thrombocytosis   . Hypertension   . Diabetes mellitus   . Gout   . Renal artery stenosis 12/13/2010    PTCA and Stent - right  . Syncopal episodes   . PAD (peripheral artery disease)   . Hyperlipemia   . Morbid obesity   . LVH (left ventricular hypertrophy)     12/21/09 Echo -mild asymmetric LVH,EF =>55%  . OSA (obstructive sleep apnea)      ALLERGIES:  is allergic to sulfa antibiotics.  MEDICATIONS:    Medication List       This list is accurate as of: 04/24/15  2:11 PM.  Always use your most recent med list.               amLODipine 5 MG tablet  Commonly known as:  NORVASC  Take 5 mg by mouth daily.     aspirin EC 81 MG tablet  Take 81 mg by mouth daily.     B COMPLEX PO  Take 1 tablet by mouth daily.     hydroxyurea 500 MG capsule  Commonly known as:  HYDREA  Take 1 capsule (500 mg total) by mouth daily. May take with food to minimize GI side effects.     Investigational - Study Medication  Take 100 mg by mouth daily. Research study with Dr. Baird Cancer - pt may  be receiving placebo or allopurinol Protocol [id number] 1 capsule [id number] 1 capsule Per pt - Allopurinol.     lacosamide 50 MG Tabs tablet  Commonly known as:  [name]  Take 1 tablet (50 mg total) by mouth 2 (two) times daily.     nebivolol 5 MG tablet  Commonly known as:  BYSTOLIC  Take 1 tablet (5 mg total) by mouth daily.     Vitamin D3 2000 UNITS capsule  Take 2,000 Units by mouth daily.        SURGICAL HISTORY:  Past Surgical History  Procedure Laterality Date  . Breast reduction surgery  1995    S/P Bilateral breast reduction  . Cesarean section  1980, 1986    Times  Two.  . Breast cyst excision      S/P Benign Right Breast Cyst Removal.  . Partial hysterectomy  1994  . Extremity cyst excision  1993    left wrist  . Renal artery stent placement Right     REVIEW OF SYSTEMS:   Constitutional: Denies fevers, chills or abnormal weight loss Eyes: Denies blurriness of vision Ears, nose, mouth, throat, and face: Denies mucositis or sore  throat Respiratory: Denies cough, dyspnea or wheezes Cardiovascular: Denies palpitation, chest discomfort or lower extremity swelling Gastrointestinal:  Denies nausea, heartburn ; she has constipation.  Skin: Denies abnormal skin rashes Lymphatics: Denies new lymphadenopathy or easy bruising Neurological:Denies numbness, tingling or new weaknesses Behavioral/Psych: Mood is stable, no new changes  All other systems were reviewed with the patient and are negative.  PHYSICAL EXAMINATION: ECOG PERFORMANCE STATUS: 0  Blood pressure 156/76, pulse 65, temperature 98 F (36.7 C), temperature source Oral, resp. rate 18, height 5\' 1"  (1.549 m), weight 225 lb 8 oz (102.286 kg), SpO2 100 %.  GENERAL:alert, no distress and comfortable; obese pleasant middle-age woman SKIN: skin color, texture, turgor are normal, no rashes or significant lesions except a large ecchymosis in the left leg EYES: normal, Conjunctiva are pink and non-injected,  sclera clear OROPHARYNX:no exudate, no erythema and lips, buccal mucosa, and tongue normal  NECK: supple, thyroid normal size, non-tender, without nodularity LYMPH:  no palpable lymphadenopathy in the cervical, axillary or inguinal LUNGS: clear to auscultation and percussion with normal breathing effort HEART: regular rate & rhythm and no murmurs and no lower extremity edema ABDOMEN:abdomen soft,  Mildly tender in the epigastric and normal bowel sounds Musculoskeletal:no cyanosis of digits and no clubbing  NEURO: alert & oriented x 3 with fluent speech, no focal motor/sensory deficits   LABORATORY DATA:  CBC Latest Ref Rng 04/24/2015 04/12/2015 03/09/2015  WBC 3.9 - 10.3 10e3/uL 5.2 5.4 5.7  Hemoglobin 11.6 - 15.9 g/dL 13.3 13.8 13.7  Hematocrit 34.8 - 46.6 % 39.8 41.1 41.5  Platelets 145 - 400 10e3/uL 432(H) 411(H) 448(H)    CMP Latest Ref Rng 04/24/2015 04/12/2015 03/09/2015  Glucose 70 - 140 mg/dl 89 130(H) 84  BUN 7.0 - 26.0 mg/dL 14.5 16 18.0  Creatinine 0.6 - 1.1 mg/dL 1.1 1.18(H) 1.0  Sodium 136 - 145 mEq/L 141 139 139  Potassium 3.5 - 5.1 mEq/L 4.4 4.3 3.9  Chloride 101 - 111 mmol/L - 102 -  CO2 22 - 29 mEq/L 26 30 27   Calcium 8.4 - 10.4 mg/dL 9.6 9.1 9.0  Total Protein 6.4 - 8.3 g/dL 6.8 - 6.9  Total Bilirubin 0.20 - 1.20 mg/dL 0.58 - 0.42  Alkaline Phos 40 - 150 U/L 79 - 83  AST 5 - 34 U/L 19 - 20  ALT 0 - 55 U/L 20 - 23   Pathology report 01/20/2009 BONE MARROW, RIGHT POSTERIOR ILIAC CREST, BIOPSY AND ASPIRATION: - CELLULAR MARROW WITH TRILINEAGE HEMATOPOIESIS AND MATURATION. - NO EVIDENCE OF EXCESS BLASTS.   BONE MARROW ASPIRATE: The aspirate material is composed of a few somewhat cellular bone marrow particles displaying a mixture of cell types. Erythroid and granulocytic series show orderly and progressive maturation. Megakaryocytes are abundant with predominantly normal morphology.  BONE MARROW CORE BIOPSY: The bone marrow core biopsy has very prominent  aspiration artifact. Sections show 40 to 60% cellularity with a mixture of cell types. The megakaryocytes are abundant with normal morphology. No megakaryocyte clustering is seen. The granulocytic series and erythroid series show orderly and progressive maturation. No excess blasts are seen.  Karyotype: 46,XX[20].nuc ish (ABL,BCR)x2  Interpretation: Cytogenetic Analysis:Normal: Cytogenetic analysis revealed the presence of normal female chromosomes with no observable clonal chromosomal abnormalities.  Molecular Cytogenetic Analysis FISH: Normal: The investigative technique of molecular cytogenetic analysis with DNA probes specific for the Major/Minor ABL (9q34) and BCR (22q11) genes revealed the lack of significant number of BCR/ABL fusion signals. These results are consistent with a normal finding and  a lack of the BCR/ABL fusion associated with CML/ALL.  RADIOGRAPHIC STUDIES:  DG CHEST 2 VIEW 01/05/2014 CLINICAL DATA: Dizzy. EXAM: CHEST 2 VIEW COMPARISON: DG CHEST 2 VIEW dated 07/20/2013 FINDINGS:  Mediastinum and hilar structures normal. Subsegmental atelectasis left lung base. No pleural effusion or pneumothorax. Heart size  normal. Degenerative changes thoracic spine IMPRESSION: Mild subsegmental atelectasis left lung base, otherwise negative chest.   ASSESSMENT: Essential thrombocythemia. Diagnosed in 2010. JAK2 mutation was present. The patient underwent a bone marrow aspirate and biopsy on 01/20/2009 which yielded a specimen that was suboptimal for evaluation. However, megakaryocytes were abundant with normal morphology. There was no clustering or excess blasts seen. Platelet count on 01/20/2009 was 881. The patient is being treated with aspirin and hydroxyurea.  PLAN:  1. Essential Thrombocythemia. JAK2 (+)  --I reviewed her above laboratory results and history extensively. I explained to her this chronic disease, not curable but treatable. Small percentage  patient will develop myelofibrosis or leukemia in late stage. The disease related risks are thrombosis, including stroke and heart attack. -Her platelet count has decreased some, 411 K today. I recommend her to continue the current dose of Hydrea, 1000 mg on Monday and Thursday, 500 mg daily on the rest of the week  -She is concerned about side effects of Hydrea, especially secondary leukemia. We discussed alternative therapy with anagrelide. She will think about it. -Continue aspirin.  2. Seizure -cont meds, follow up   3. Syncope episode -Unclear etiology. -I checked her orthostatic blood pressure, blood pressure was 152/76 at the sitting and after few minutes of walking. -I do not think it is related to her Hydrea. I suggest her to continue follow-up with her primary care physician and neurology. -If no other etiology can be identified, I would consider changing his Hydrea to anagrelide.  Follow-up.  -continue hydrea -RTC with lab in 2 month   --All questions were answered. The patient knows to call the clinic with any problems, questions or concerns. We can certainly see the patient much sooner if necessary.  --I spent 15 minutes counseling the patient face to face. The total time spent in the appointment was 25 minutes.    Truitt Merle  04/24/2015

## 2015-04-24 NOTE — Telephone Encounter (Signed)
Pt confirmed labs/ov per 07/01 POF, gave pt AVS and Calendar... KJ °

## 2015-04-28 ENCOUNTER — Ambulatory Visit: Payer: BC Managed Care – PPO | Admitting: Adult Health

## 2015-05-11 ENCOUNTER — Telehealth: Payer: Self-pay | Admitting: Adult Health

## 2015-05-11 ENCOUNTER — Telehealth: Payer: Self-pay | Admitting: Cardiovascular Disease

## 2015-05-11 NOTE — Telephone Encounter (Signed)
Sara [name] is calling because she was placed on Bystolic and when she takes it her bp drops . Please call   Thanks

## 2015-05-11 NOTE — Telephone Encounter (Signed)
Spoke to patient, she is actually reporting problems w/ possible seizure activity - had episode where she "blanked out" for 3-5 minutes. Noted her concerns w/ possible interaction of meds, but noted she has been on most of these for a while w/ none or occasional problems.  Advised to speak to neurology regarding medications, further tests.  She was on Keppra historically, no longer taking - would need neurologist to recommend restarting or remaining off. Currently part of pharmacological study, blind test - she does not know if she is on allopurinol or placebo.  She expressed understanding regarding recommendation to call neurology.

## 2015-05-11 NOTE — Telephone Encounter (Signed)
Patient is calling in regard to an episode she had today after she took hydrea.  She was feeling fine but then started to feel hazy like and went into a stare and could hear people but could not respond. BP 134/80.  Please call.

## 2015-05-12 ENCOUNTER — Telehealth: Payer: Self-pay | Admitting: *Deleted

## 2015-05-12 NOTE — Telephone Encounter (Signed)
I called the patient. It sounds like she may have had another seizure. Although patient is convinced that it was not a seizure event. I explained to the patient that we should increase her [name]. The patient does not want to increase this medication at this time. She would rather be started on a second medication. I explained that we could start the patient on Topamax. I have reviewed the side effects of this medication with the patient. Patient states that she wants to research this medication before starting it. She will call us back this afternoon and let us know her plan.

## 2015-05-12 NOTE — Telephone Encounter (Signed)
Patient called and left message stating that she has had a seizure according to her neurologist.  She would like to discuss her hydrea with Dr.Feng.  Will route to Dr. Burr Medico.  Call back is [phone] after 3 or 3:30pm.

## 2015-05-12 NOTE — Telephone Encounter (Signed)
Yesterday she got light headed and she was a little confused and she noticed that she was just in a stare. Patient is also taking her vimpat like she should. Patient states she is concerned. Patient states could this be from stress. Dr. Baird Cancer took her off her blood pressure pill.  Patient said you can leave her a message is her working 10 hour days. Patient wants to know can she have a MRI.

## 2015-05-13 NOTE — Telephone Encounter (Signed)
I called pt twice today around 8am and 5:30pm, left VM and encourage her to call me back.  Truitt Merle  05/13/2015

## 2015-05-13 NOTE — Telephone Encounter (Signed)
Called patient and she wants to stay on the vimpat 50 mg . Patient wants to monitor a little longer before she switches any medication. Patient states sorry of the inconvenience. Patient will call back if she needs use. Please tell Jinny Blossom thank you. Patient is not going to start Topamax. Made patient aware that Jinny Blossom Was gone for the day and she will return 05-14-15. Patient was fine with this.

## 2015-05-13 NOTE — Telephone Encounter (Signed)
Called patient and left her a message asking her to return phone call. I want to make sure we are on the same page as far as medication. Please link me when patient calls back.

## 2015-05-13 NOTE — Telephone Encounter (Addendum)
Pt called and states that she would like to stay with her current medication lacosamide ([name]) 50 MG TABS tablet  and NOT start the Topamax. The only thing that concerns her with [name] is that she has a hard time sleeping. She wants to know why she is having seizures in general. It is a big concern for her. Please call and advise [phone]

## 2015-05-13 NOTE — Telephone Encounter (Signed)
The patient never called back regarding starting Topamax. Please call the patient and see if she is amendable to this. She wanted to research the medication before starting it. Thanks.

## 2015-05-13 NOTE — Telephone Encounter (Signed)
Patient called returning Dana's call. Patient can be reached at [phone] before 11:00 and after 3:30. She has class. Home number for this evening is [phone] after 5:00.

## 2015-05-14 NOTE — Telephone Encounter (Signed)
It is my recommedation that [name] be increased. Patient felt that she could not tolerate an increase therefore I suggested we start her on another anti-convulsive medication such as Topamax. Patient is refusing for now. She does not want to increase [name] nor start another medication such as Topamax.

## 2015-05-18 NOTE — Telephone Encounter (Addendum)
Pt called and would like to talk about new medications. She has spoke with her PCP and has more questions. Please call and advise

## 2015-05-19 NOTE — Telephone Encounter (Signed)
I called and spoke to the patient's son again. She is not home. He will have the patient call me tomorrow.

## 2015-05-19 NOTE — Telephone Encounter (Signed)
I called the patient. Her son answered and requested I try back around 4:30. I told him I would try to call back then.

## 2015-05-20 NOTE — Telephone Encounter (Signed)
I called and spoke to the patient's husband. She is not available. He will have her call back.

## 2015-05-26 NOTE — Telephone Encounter (Signed)
I called and spoke to the patient's husband. She is not available. He will have her call back.

## 2015-05-29 ENCOUNTER — Telehealth: Payer: Self-pay | Admitting: *Deleted

## 2015-05-29 NOTE — Telephone Encounter (Signed)
Patient called requesting a call from Dr. Burr Medico.  Asked what this is in reference to.  "Hydrea."  Was able to draw from her that she has questions about Hydrea.  Denies any adverse effects "just questions and need to talk to Dr. Burr Medico".

## 2015-05-29 NOTE — Telephone Encounter (Signed)
I called her back, and agreed with her to reduce hydrea to 500mg  daily until I see her next time. She had an syncope episode after she took the pm dose hydrea, and she is concerned about hydrea my caused her syncope.  Truitt Merle 05/29/2015

## 2015-06-03 ENCOUNTER — Telehealth: Payer: Self-pay | Admitting: Cardiovascular Disease

## 2015-06-03 NOTE — Telephone Encounter (Signed)
Picking up samples of bystolic 5 mg (#51) and made an appointment for OV in October

## 2015-06-03 NOTE — Telephone Encounter (Signed)
Returned call to patient no answer.LMTC. 

## 2015-06-03 NOTE — Telephone Encounter (Signed)
Mrs. Dickard is calling to see if we have any samples 5mg  Bystolic . Please call   Thanks

## 2015-06-03 NOTE — Telephone Encounter (Signed)
Patient is returning a call from [name] 

## 2015-06-15 ENCOUNTER — Telehealth: Payer: Self-pay | Admitting: Neurology

## 2015-06-15 NOTE — Telephone Encounter (Signed)
Patient called regarding lacosamide ([name]) 50 MG TABS tablet. Patient doesn't have $64 (without coupon) for the medication. Is wondering if we have a coupon for it or samples so that it won't be so expensive or will need to consider another medication. W) [phone], [phone].

## 2015-06-15 NOTE — Telephone Encounter (Signed)
Patient called back and will be by today to pick it up.

## 2015-06-15 NOTE — Telephone Encounter (Signed)
Left message for patient on vm stating that I have left a co-pay card at the front desk for her to pick up at her convenience.

## 2015-06-24 ENCOUNTER — Other Ambulatory Visit: Payer: Self-pay | Admitting: Hematology

## 2015-06-24 NOTE — Telephone Encounter (Signed)
Left message for pt to return call regarding refill on her hydrea

## 2015-06-25 ENCOUNTER — Other Ambulatory Visit (HOSPITAL_BASED_OUTPATIENT_CLINIC_OR_DEPARTMENT_OTHER): Payer: BC Managed Care – PPO

## 2015-06-25 ENCOUNTER — Ambulatory Visit (HOSPITAL_BASED_OUTPATIENT_CLINIC_OR_DEPARTMENT_OTHER): Payer: BC Managed Care – PPO | Admitting: Hematology

## 2015-06-25 ENCOUNTER — Telehealth: Payer: Self-pay | Admitting: Hematology

## 2015-06-25 ENCOUNTER — Telehealth: Payer: Self-pay | Admitting: Cardiovascular Disease

## 2015-06-25 VITALS — BP 154/62 | HR 69 | Temp 98.0°F | Resp 18 | Ht 61.0 in | Wt 229.9 lb

## 2015-06-25 DIAGNOSIS — R569 Unspecified convulsions: Secondary | ICD-10-CM

## 2015-06-25 DIAGNOSIS — D473 Essential (hemorrhagic) thrombocythemia: Secondary | ICD-10-CM

## 2015-06-25 DIAGNOSIS — R55 Syncope and collapse: Secondary | ICD-10-CM | POA: Diagnosis not present

## 2015-06-25 LAB — COMPREHENSIVE METABOLIC PANEL (CC13)
ALBUMIN: 3.5 g/dL (ref 3.5–5.0)
ALK PHOS: 83 U/L (ref 40–150)
ALT: 20 U/L (ref 0–55)
ANION GAP: 6 meq/L (ref 3–11)
AST: 18 U/L (ref 5–34)
BILIRUBIN TOTAL: 0.31 mg/dL (ref 0.20–1.20)
BUN: 18.5 mg/dL (ref 7.0–26.0)
CHLORIDE: 106 meq/L (ref 98–109)
CO2: 29 mEq/L (ref 22–29)
CREATININE: 1.2 mg/dL — AB (ref 0.6–1.1)
Calcium: 9.4 mg/dL (ref 8.4–10.4)
EGFR: 60 mL/min/{1.73_m2} — ABNORMAL LOW (ref >90)
Glucose: 94 mg/dl (ref 70–140)
Potassium: 4.1 mEq/L (ref 3.5–5.1)
Sodium: 141 mEq/L (ref 136–145)
TOTAL PROTEIN: 6.8 g/dL (ref 6.4–8.3)

## 2015-06-25 LAB — CBC WITH DIFFERENTIAL/PLATELET
BASO%: 1.1 % (ref 0.0–2.0)
Basophils Absolute: 0.1 10*3/uL (ref 0.0–0.1)
EOS%: 4.1 % (ref 0.0–7.0)
Eosinophils Absolute: 0.3 10*3/uL (ref 0.0–0.5)
HEMATOCRIT: 38.9 % (ref 34.8–46.6)
HGB: 13.1 g/dL (ref 11.6–15.9)
LYMPH%: 28.4 % (ref 14.0–49.7)
MCH: 34.4 pg — ABNORMAL HIGH (ref 25.1–34.0)
MCHC: 33.8 g/dL (ref 31.5–36.0)
MCV: 101.8 fL — ABNORMAL HIGH (ref 79.5–101.0)
MONO#: 0.5 10*3/uL (ref 0.1–0.9)
MONO%: 8.3 % (ref 0.0–14.0)
NEUT%: 58.1 % (ref 38.4–76.8)
NEUTROS ABS: 3.7 10*3/uL (ref 1.5–6.5)
PLATELETS: 421 10*3/uL — AB (ref 145–400)
RBC: 3.82 10*6/uL (ref 3.70–5.45)
RDW: 12.3 % (ref 11.2–14.5)
WBC: 6.4 10*3/uL (ref 3.9–10.3)
lymph#: 1.8 10*3/uL (ref 0.9–3.3)

## 2015-06-25 MED ORDER — NEBIVOLOL HCL 5 MG PO TABS: 5.0000 mg | ORAL_TABLET | Freq: Every day | ORAL | Status: DC

## 2015-06-25 MED ORDER — HYDROXYUREA 500 MG PO CAPS: 500.0000 mg | ORAL_CAPSULE | Freq: Every day | ORAL | Status: DC

## 2015-06-25 NOTE — Progress Notes (Signed)
[facility] OFFICE PROGRESS NOTE   Sara Greenland, MD [address] [place] [address]  DIAGNOSIS: Essential thrombocytosis  CC: follow up Essential Thrombocytosis  CURRENT THERAPY: Hydroxyurea 500 mg daily, started in 2010., increased to hydrea 1000mg  daily on Monday and  Thursday, and 500mg  daily for the rest of week in 01/2015,  Aspirin 81 mg daily.  INTERVAL HISTORY:  Sara [name] 59 y.o. female with history of JAK positive Essential thrombocythemia presents for follow up. She was last seen by me 2 month ago. She had another episode of syncope after her last visit, fell down, and had a mild skin bruise on her left leg. She was seen by her neurologist again, and was referred to [facility] for second opinion next months. She states she has syncope episodes most in the summers, when she has a lot of stress from lower working, his son's illness etc. she is able to back to normal 8 hours a day of work daily, and feels better. She been tolerating Hydrea well, no noticeable side effects. She is taking 500 mg daily except Tuesday and Thursday for 1000 mg.  MEDICAL HISTORY: Past Medical History  Diagnosis Date  . Thrombocytosis   . Hypertension   . Diabetes mellitus   . Gout   . Renal artery stenosis 12/13/2010    PTCA and Stent - right  . Syncopal episodes   . PAD (peripheral artery disease)   . Hyperlipemia   . Morbid obesity   . LVH (left ventricular hypertrophy)     12/21/09 Echo -mild asymmetric LVH,EF =>55%  . OSA (obstructive sleep apnea)      ALLERGIES:  is allergic to sulfa antibiotics.  MEDICATIONS:    Medication List       This list is accurate as of: 06/25/15 12:53 PM.  Always use your most recent med list.               amLODipine 5 MG tablet  Commonly known as:  NORVASC  Take 5 mg by mouth daily.     aspirin EC 81 MG tablet  Take 81 mg by mouth daily.     B COMPLEX PO  Take 1 tablet by mouth daily.     hydroxyurea  500 MG capsule  Commonly known as:  HYDREA  Take 1 capsule (500 mg total) by mouth daily. May take with food to minimize GI side effects.     Investigational - Study Medication  Take 100 mg by mouth daily. Research study with Dr. Baird Cancer - pt may be receiving placebo or allopurinol Protocol [id number] 1 capsule [id number] 1 capsule Per pt - Allopurinol.     lacosamide 50 MG Tabs tablet  Commonly known as:  [name]  Take 1 tablet (50 mg total) by mouth 2 (two) times daily.     nebivolol 5 MG tablet  Commonly known as:  BYSTOLIC  Take 1 tablet (5 mg total) by mouth daily.     Vitamin D3 2000 UNITS capsule  Take 2,000 Units by mouth daily.        SURGICAL HISTORY:  Past Surgical History  Procedure Laterality Date  . Breast reduction surgery  1995    S/P Bilateral breast reduction  . Cesarean section  1980, 1986    Times  Two.  . Breast cyst excision      S/P Benign Right Breast Cyst Removal.  . Partial hysterectomy  1994  . Extremity cyst excision  1993  left wrist  . Renal artery stent placement Right     REVIEW OF SYSTEMS:   Constitutional: Denies fevers, chills or abnormal weight loss Eyes: Denies blurriness of vision Ears, nose, mouth, throat, and face: Denies mucositis or sore throat Respiratory: Denies cough, dyspnea or wheezes Cardiovascular: Denies palpitation, chest discomfort or lower extremity swelling Gastrointestinal:  Denies nausea, heartburn ; she has constipation.  Skin: Denies abnormal skin rashes Lymphatics: Denies new lymphadenopathy or easy bruising Neurological:Denies numbness, tingling or new weaknesses Behavioral/Psych: Mood is stable, no new changes  All other systems were reviewed with the patient and are negative.  PHYSICAL EXAMINATION: ECOG PERFORMANCE STATUS: 0  Blood pressure 154/62, pulse 69, temperature 98 F (36.7 C), temperature source Oral, resp. rate 18, height 5\' 1"  (1.549 m), weight 229 lb 14.4 oz (104.282 kg), SpO2 99  %.  GENERAL:alert, no distress and comfortable; obese pleasant middle-age woman SKIN: skin color, texture, turgor are normal, no rashes or significant lesions except a large ecchymosis in the left leg EYES: normal, Conjunctiva are pink and non-injected, sclera clear OROPHARYNX:no exudate, no erythema and lips, buccal mucosa, and tongue normal  NECK: supple, thyroid normal size, non-tender, without nodularity LYMPH:  no palpable lymphadenopathy in the cervical, axillary or inguinal LUNGS: clear to auscultation and percussion with normal breathing effort HEART: regular rate & rhythm and no murmurs and no lower extremity edema ABDOMEN:abdomen soft,  Mildly tender in the epigastric and normal bowel sounds Musculoskeletal:no cyanosis of digits and no clubbing  NEURO: alert & oriented x 3 with fluent speech, no focal motor/sensory deficits   LABORATORY DATA:  CBC Latest Ref Rng 04/24/2015 04/12/2015 03/09/2015  WBC 3.9 - 10.3 10e3/uL 5.2 5.4 5.7  Hemoglobin 11.6 - 15.9 g/dL 13.3 13.8 13.7  Hematocrit 34.8 - 46.6 % 39.8 41.1 41.5  Platelets 145 - 400 10e3/uL 432(H) 411(H) 448(H)    CMP Latest Ref Rng 04/24/2015 04/12/2015 03/09/2015  Glucose 70 - 140 mg/dl 89 130(H) 84  BUN 7.0 - 26.0 mg/dL 14.5 16 18.0  Creatinine 0.6 - 1.1 mg/dL 1.1 1.18(H) 1.0  Sodium 136 - 145 mEq/L 141 139 139  Potassium 3.5 - 5.1 mEq/L 4.4 4.3 3.9  Chloride 101 - 111 mmol/L - 102 -  CO2 22 - 29 mEq/L 26 30 27   Calcium 8.4 - 10.4 mg/dL 9.6 9.1 9.0  Total Protein 6.4 - 8.3 g/dL 6.8 - 6.9  Total Bilirubin 0.20 - 1.20 mg/dL 0.58 - 0.42  Alkaline Phos 40 - 150 U/L 79 - 83  AST 5 - 34 U/L 19 - 20  ALT 0 - 55 U/L 20 - 23   Pathology report 01/20/2009 BONE MARROW, RIGHT POSTERIOR ILIAC CREST, BIOPSY AND ASPIRATION: - CELLULAR MARROW WITH TRILINEAGE HEMATOPOIESIS AND MATURATION. - NO EVIDENCE OF EXCESS BLASTS.   BONE MARROW ASPIRATE: The aspirate material is composed of a few somewhat cellular bone marrow particles  displaying a mixture of cell types. Erythroid and granulocytic series show orderly and progressive maturation. Megakaryocytes are abundant with predominantly normal morphology.  BONE MARROW CORE BIOPSY: The bone marrow core biopsy has very prominent aspiration artifact. Sections show 40 to 60% cellularity with a mixture of cell types. The megakaryocytes are abundant with normal morphology. No megakaryocyte clustering is seen. The granulocytic series and erythroid series show orderly and progressive maturation. No excess blasts are seen.  Karyotype: 46,XX[20].nuc ish (ABL,BCR)x2  Interpretation: Cytogenetic Analysis:Normal: Cytogenetic analysis revealed the presence of normal female chromosomes with no observable clonal chromosomal abnormalities.  Molecular Psychologist, occupational  FISH: Normal: The investigative technique of molecular cytogenetic analysis with DNA probes specific for the Major/Minor ABL (9q34) and BCR (22q11) genes revealed the lack of significant number of BCR/ABL fusion signals. These results are consistent with a normal finding and a lack of the BCR/ABL fusion associated with CML/ALL.  RADIOGRAPHIC STUDIES:  DG CHEST 2 VIEW 01/05/2014 CLINICAL DATA: Dizzy. EXAM: CHEST 2 VIEW COMPARISON: DG CHEST 2 VIEW dated 07/20/2013 FINDINGS:  Mediastinum and hilar structures normal. Subsegmental atelectasis left lung base. No pleural effusion or pneumothorax. Heart size  normal. Degenerative changes thoracic spine IMPRESSION: Mild subsegmental atelectasis left lung base, otherwise negative chest.   ASSESSMENT: Essential thrombocythemia. Diagnosed in 2010. JAK2 mutation was present. The patient underwent a bone marrow aspirate and biopsy on 01/20/2009 which yielded a specimen that was suboptimal for evaluation. However, megakaryocytes were abundant with normal morphology. There was no clustering or excess blasts seen. Platelet count on 01/20/2009 was 881. The  patient is being treated with aspirin and hydroxyurea.  PLAN:  1. Essential Thrombocythemia. JAK2 (+)  --I reviewed her above laboratory results and history extensively. I explained to her this chronic disease, not curable but treatable. Small percentage patient will develop myelofibrosis or leukemia in late stage. The disease related risks are thrombosis, including stroke and heart attack. -Her platelet count has been stable, 430 2K today. I think Hydrea is controlled his disease well, but she does have concern about its relationship to her syncope episode, she is also concerned about potential side effect of leukemia from Hydrea. -We discussed alternative therapy and anagrelide, and potential side effects. I given her the written material, and she'll think about if she wants to switch. -Continue aspirin.  2. Seizure -cont meds, follow up  -She has a second opinion visit at [facility] next months  3. Syncope episode -Unclear etiology. -I suspect the possibility of her syncope secondary to Hydrea is low, but I certainly would consider changing her Hydrea to anagrelide if needed.   Follow-up.  -continue hydrea at current dose  -RTC with lab in 4 month  -She'll call me if she wants to switch Hydrea to anagrelide.  --All questions were answered. The patient knows to call the clinic with any problems, questions or concerns. We can certainly see the patient much sooner if necessary.  --I spent 20 minutes counseling the patient face to face. The total time spent in the appointment was 25 minutes.    Truitt Merle  06/25/2015

## 2015-06-25 NOTE — Telephone Encounter (Signed)
Patient calling the office for samples of medication:   1.  What medication and dosage are you requesting samples for? Bystolic 5 mg   2.  Are you currently out of this medication? Yes   3. Are you requesting samples to get you through until a mail order prescription arrives? No

## 2015-06-25 NOTE — Telephone Encounter (Signed)
Gave and printed appt sched and avs for pt for Jan 2017 °

## 2015-06-25 NOTE — Telephone Encounter (Signed)
Patient aware samples are at the front desk for pick up  

## 2015-06-26 ENCOUNTER — Encounter: Payer: Self-pay | Admitting: Hematology

## 2015-07-21 ENCOUNTER — Encounter: Payer: Self-pay | Admitting: Adult Health

## 2015-07-21 ENCOUNTER — Ambulatory Visit (INDEPENDENT_AMBULATORY_CARE_PROVIDER_SITE_OTHER): Payer: BC Managed Care – PPO | Admitting: Adult Health

## 2015-07-21 VITALS — BP 130/90 | HR 88 | Ht 61.0 in | Wt 234.0 lb

## 2015-07-21 DIAGNOSIS — R569 Unspecified convulsions: Secondary | ICD-10-CM

## 2015-07-21 NOTE — Progress Notes (Signed)
I have read the note, and I agree with the clinical assessment and plan.  WILLIS,CHARLES KEITH   

## 2015-07-21 NOTE — Progress Notes (Signed)
PATIENT: Sara [name] DOB: [birth year]  REASON FOR VISIT: follow up-seizures HISTORY FROM: patient  HISTORY OF PRESENT ILLNESS: Sara [name] is a 59 year old female with a history of seizures. She returns today for follow-up. The patient states that she has been taking her [name] 50 mg twice a day. She denies having any seizures since the last visit. She states that she is now working 8 hour days instead of 10 or 12 hour days at work. She feels that this has been beneficial for her. She does have some ongoing stress with her son but she is dealing with that. She does not operate a motor vehicle. She is able to complete all ADLs independently. She returns today for an evaluation.  HISTORY 04/16/15: Sara [name] is a 59 year old female with a history of seizures. She returns today for follow-up. She is currently taking [name] 50 mg twice a day. She reports that she had a seizure over the weekend. She states that She was at [facility] and she started to feel strange she got up to go the bathroom and that is when she fell to the floor. She did go to the emergency room. She sustained a bruise to the bottom lip and a laceration on the left leg. Patient states that she was only taking one tablet of [name] at bedtime. She does state that she's been under a lot of stress with sick family members and working 10 hour days. When taken appropriately [name] has worked well for the patient. The patient does not like to take medication. She states this is why she was only taking one tablet. She is also felt that she was doing well with one tablet and felt that this was sufficient. Patient also felt that losartan and atorvastatin may be causing her seizures. Her primary care has taken her off of these medications.She returns today for an evaluation.  REVIEW OF SYSTEMS: Out of a complete 14 system review of symptoms, the patient complains only of the following symptoms, and all other reviewed systems are  negative.  See history of present illness  ALLERGIES: Allergies  Allergen Reactions  . Sulfa Antibiotics Itching    HOME MEDICATIONS: Outpatient Prescriptions Prior to Visit  Medication Sig Dispense Refill  . aspirin EC 81 MG tablet Take 81 mg by mouth daily.    Marland Kitchen atorvastatin (LIPITOR) 10 MG tablet Take 10 mg by mouth daily.  0  . B Complex Vitamins (B COMPLEX PO) Take 1 tablet by mouth daily.     . Cholecalciferol (VITAMIN D3) 2000 UNITS capsule Take 2,000 Units by mouth daily.     . hydroxyurea (HYDREA) 500 MG capsule Take 1 capsule (500 mg total) by mouth daily. TAKES 1000MG  ON MON AND THURS TAKES 500MG  ALL OTHER DAYS May take with food to minimize GI side effects. 40 capsule 3  . lacosamide ([name]) 50 MG TABS tablet Take 1 tablet (50 mg total) by mouth 2 (two) times daily. (Patient taking differently: Take 50 mg by mouth every evening. ) 180 tablet 1  . nebivolol (BYSTOLIC) 5 MG tablet Take 1 tablet (5 mg total) by mouth daily. 28 tablet 0  . STUDY MEDICATION Take 100 mg by mouth daily. Research study with Dr. Baird Cancer - pt may be receiving placebo or allopurinol Protocol [id number] 1 capsule [id number] 1 capsule Per pt - Allopurinol.    . telmisartan (MICARDIS) 20 MG tablet Take 20 mg by mouth daily.  0   No facility-administered medications prior to visit.  PAST MEDICAL HISTORY: Past Medical History  Diagnosis Date  . Thrombocytosis   . Hypertension   . Diabetes mellitus   . Gout   . Renal artery stenosis 12/13/2010    PTCA and Stent - right  . Syncopal episodes   . PAD (peripheral artery disease)   . Hyperlipemia   . Morbid obesity   . LVH (left ventricular hypertrophy)     12/21/09 Echo -mild asymmetric LVH,EF =>55%  . OSA (obstructive sleep apnea)     PAST SURGICAL HISTORY: Past Surgical History  Procedure Laterality Date  . Breast reduction surgery  1995    S/P Bilateral breast reduction  . Cesarean section  1980, 1986    Times  Two.  . Breast  cyst excision      S/P Benign Right Breast Cyst Removal.  . Partial hysterectomy  1994  . Extremity cyst excision  1993    left wrist  . Renal artery stent placement Right     FAMILY HISTORY: Family History  Problem Relation Age of Onset  . Breast cancer Maternal Aunt     2 maternal aunts had breast cancer.  Marland Kitchen Heart failure Mother   . Diabetes Father   . Diabetes Sister   . Hypertension Sister   . Breast cancer Maternal Grandmother   . Hypertension Sister   . Hypertension Sister     SOCIAL HISTORY: Social History   Social History  . Marital Status: Married    Spouse Name: Juanda Crumble  . Number of Children: 2  . Years of Education: 16   Occupational History  . [employer].    Social History Main Topics  . Smoking status: Never Smoker   . Smokeless tobacco: Never Used  . Alcohol Use: No  . Drug Use: No  . Sexual Activity: Yes   Other Topics Concern  . Not on file   Social History Narrative   Patient lives at husband and her son, home with family.   Patient has two adult children.   Patient is not drinking any caffeine.   Patient is working full-time.   Patient has a college education.   Patient is right-handed.      PHYSICAL EXAM  Filed Vitals:   07/21/15 0811  BP: 130/90  Pulse: 88  Height: 5\' 1"  (1.549 m)  Weight: 234 lb (106.142 kg)   Body mass index is 44.24 kg/(m^2).  Generalized: Well developed, in no acute distress   Neurological examination  Mentation: Alert oriented to time, place, history taking. Follows all commands speech and language fluent Cranial nerve II-XII: Pupils were equal round reactive to light. Extraocular movements were full, visual field were full on confrontational test. Facial sensation and strength were normal. Uvula tongue midline. Head turning and shoulder shrug  were normal and symmetric. Motor: The motor testing reveals 5 over 5 strength of all 4 extremities. Good symmetric motor tone is noted throughout.   Sensory: Sensory testing is intact to soft touch on all 4 extremities. No evidence of extinction is noted.  Coordination: Cerebellar testing reveals good finger-nose-finger and heel-to-shin bilaterally.  Gait and station: Gait is normal. Tandem gait is normal. Romberg is negative. No drift is seen.  Reflexes: Deep tendon reflexes are symmetric and normal bilaterally.   DIAGNOSTIC DATA (LABS, IMAGING, TESTING) - I reviewed patient records, labs, notes, testing and imaging myself where available.  Lab Results  Component Value Date   WBC 6.4 06/25/2015   HGB 13.1 06/25/2015   HCT 38.9 06/25/2015  MCV 101.8* 06/25/2015   PLT 421* 06/25/2015      Component Value Date/Time   NA 141 06/25/2015 1241   NA 139 04/12/2015 1733   K 4.1 06/25/2015 1241   K 4.3 04/12/2015 1733   CL 102 04/12/2015 1733   CL 102 03/19/2013 1535   CO2 29 06/25/2015 1241   CO2 30 04/12/2015 1733   GLUCOSE 94 06/25/2015 1241   GLUCOSE 130* 04/12/2015 1733   GLUCOSE 117* 03/19/2013 1535   BUN 18.5 06/25/2015 1241   BUN 16 04/12/2015 1733   CREATININE 1.2* 06/25/2015 1241   CREATININE 1.18* 04/12/2015 1733   CALCIUM 9.4 06/25/2015 1241   CALCIUM 9.1 04/12/2015 1733   PROT 6.8 06/25/2015 1241   PROT 6.9 04/14/2014 0002   ALBUMIN 3.5 06/25/2015 1241   ALBUMIN 3.5 04/14/2014 0002   AST 18 06/25/2015 1241   AST 17 04/14/2014 0002   ALT 20 06/25/2015 1241   ALT 22 04/14/2014 0002   ALKPHOS 83 06/25/2015 1241   ALKPHOS 84 04/14/2014 0002   BILITOT 0.31 06/25/2015 1241   BILITOT 0.3 04/14/2014 0002   GFRNONAA 73* 04/12/2015 1733   GFRAA 23* 04/12/2015 1733      ASSESSMENT AND PLAN 59 y.o. year old female  has a past medical history of Thrombocytosis; Hypertension; Diabetes mellitus; Gout; Renal artery stenosis (12/13/2010); Syncopal episodes; PAD (peripheral artery disease); Hyperlipemia; Morbid obesity; LVH (left ventricular hypertrophy); and OSA (obstructive sleep apnea). here with:  1.  Seizures  Overall the patient is doing well. She has not had a seizure event since the last visit. She will continue taking [name] 50 mg twice a day. Patient advised that if she has any seizure events she should let us know. She will follow-up in 6 months with Dr. Jannifer Franklin.     Ward Givens, MSN, NP-C 07/21/2015, 8:22 AM [facility] [address], [place], [place] [address] [phone]

## 2015-07-21 NOTE — Patient Instructions (Signed)
Continue [name] If you have any seizure events please let us know.

## 2015-08-17 ENCOUNTER — Ambulatory Visit (INDEPENDENT_AMBULATORY_CARE_PROVIDER_SITE_OTHER): Payer: BC Managed Care – PPO | Admitting: Cardiovascular Disease

## 2015-08-17 ENCOUNTER — Encounter: Payer: Self-pay | Admitting: Cardiovascular Disease

## 2015-08-17 VITALS — BP 130/78 | HR 73 | Resp 16 | Ht 62.0 in | Wt 223.0 lb

## 2015-08-17 DIAGNOSIS — I701 Atherosclerosis of renal artery: Secondary | ICD-10-CM | POA: Diagnosis not present

## 2015-08-17 DIAGNOSIS — I5032 Chronic diastolic (congestive) heart failure: Secondary | ICD-10-CM | POA: Diagnosis not present

## 2015-08-17 DIAGNOSIS — G4730 Sleep apnea, unspecified: Secondary | ICD-10-CM

## 2015-08-17 DIAGNOSIS — E785 Hyperlipidemia, unspecified: Secondary | ICD-10-CM

## 2015-08-17 DIAGNOSIS — G4733 Obstructive sleep apnea (adult) (pediatric): Secondary | ICD-10-CM | POA: Diagnosis not present

## 2015-08-17 MED ORDER — NEBIVOLOL HCL 5 MG PO TABS: 5.0000 mg | ORAL_TABLET | Freq: Every day | ORAL | Status: DC

## 2015-08-17 NOTE — Progress Notes (Signed)
Patient ID: Sara [name], female   DOB: [birth year], 59 y.o.   MRN: [id number]     Cardiology Office Note   Date:  08/18/2015   ID:  Sara [name], DOB 1956/09/04, MRN [id number]  PCP:  [name], MD  Cardiologist:   Sanda Klein, MD   Chief Complaint  Patient presents with  . overdue yearly    Patient has no complaints.      History of Present Illness: Sara [name] is a 59 y.o. female who presents for  Follow-up for history of renal artery stenosis status post stent in 2012, hypertension, hyperlipidemia, diastolic heart failure and obstructive sleep apnea. A syncopal event within the last couple of years was felt to be secondary to typical absence seizures. She has not had any seizures now in many months and is able to drive again. She thinks that her seizure meds been a one-time event and would like to discontinue her antiepileptic medication, but understands she should not do so without expert advice.   She has not had problems with chest pain or shortness of breath, palpitations, recurrent syncope, lower extremity edema, dizziness, focal neurological events.  She reports compliance with CPAP. She also reports excellent glycemic control. Her last renal Doppler May 2015 was normal study and showed no change from 2012.  Normal left ventricular systolic function by echo in 2014 with mild LVH and mild left atrial enlargement. Normal nuclear stress test in 2011.  He also has a history of essential thrombocythemia on treatment with hydroxyurea.    Past Medical History  Diagnosis Date  . Thrombocytosis ([place])   . Hypertension   . Diabetes mellitus   . Gout   . Renal artery stenosis ([place]) 12/13/2010    PTCA and Stent - right  . Syncopal episodes   . PAD (peripheral artery disease) ([employer])   . Hyperlipemia   . Morbid obesity ([place])   . LVH (left ventricular hypertrophy)     12/21/09 Echo -mild asymmetric LVH,EF =>55%  . OSA (obstructive sleep apnea)     Past Surgical  History  Procedure Laterality Date  . Breast reduction surgery  1995    S/P Bilateral breast reduction  . Cesarean section  1980, 1986    Times  Two.  . Breast cyst excision      S/P Benign Right Breast Cyst Removal.  . Partial hysterectomy  1994  . Extremity cyst excision  1993    left wrist  . Renal artery stent placement Right      Current Outpatient Prescriptions  Medication Sig Dispense Refill  . aspirin EC 81 MG tablet Take 81 mg by mouth daily.    Marland Kitchen atorvastatin (LIPITOR) 10 MG tablet Take 10 mg by mouth daily.  0  . B Complex Vitamins (B COMPLEX PO) Take 1 tablet by mouth daily.     . Cholecalciferol (VITAMIN D3) 2000 UNITS capsule Take 2,000 Units by mouth daily.     . hydroxyurea (HYDREA) 500 MG capsule Take 1 capsule (500 mg total) by mouth daily. TAKES 1000MG  ON MON AND THURS TAKES 500MG  ALL OTHER DAYS May take with food to minimize GI side effects. 40 capsule 3  . lacosamide ([name]) 50 MG TABS tablet Take 1 tablet (50 mg total) by mouth 2 (two) times daily. (Patient taking differently: Take 50 mg by mouth every evening. ) 180 tablet 1  . nebivolol (BYSTOLIC) 5 MG tablet Take 1 tablet (5 mg total) by mouth daily. 28 tablet 0  .  STUDY MEDICATION Take 100 mg by mouth daily. Research study with Dr. Baird Cancer - pt may be receiving placebo or allopurinol Protocol [id number] 1 capsule [id number] 1 capsule Per pt - Allopurinol.    . telmisartan (MICARDIS) 20 MG tablet Take 20 mg by mouth daily.  0   No current facility-administered medications for this visit.    Allergies:   Sulfa antibiotics    Social History:  The patient  reports that she has never smoked. She has never used smokeless tobacco. She reports that she does not drink alcohol or use illicit drugs.   Family History:  The patient's family history includes Breast cancer in her maternal aunt and maternal grandmother; Diabetes in her father and sister; Heart failure in her mother; Hypertension in her sister,  sister, and sister.    ROS:  Please see the history of present illness.    Otherwise, review of systems positive for none.   All other systems are reviewed and negative.    PHYSICAL EXAM: VS:  BP 150/78 mmHg  Pulse 73  Resp 16  Ht 5\' 2"  (1.575 m)  Wt 223 lb (101.152 kg)  BMI 40.78 kg/m2 , BMI Body mass index is 40.78 kg/(m^2).  General: Alert, oriented x3, no distress Head: no evidence of trauma, PERRL, EOMI, no exophtalmos or lid lag, no myxedema, no xanthelasma; normal ears, nose and oropharynx Neck: normal jugular venous pulsations and no hepatojugular reflux; brisk carotid pulses without delay and no carotid bruits Chest: clear to auscultation, no signs of consolidation by percussion or palpation, normal fremitus, symmetrical and full respiratory excursions Cardiovascular: normal position and quality of the apical impulse, regular rhythm, normal first and second heart sounds, no murmurs, rubs or gallops Abdomen: no tenderness or distention, no masses by palpation, no abnormal pulsatility or arterial bruits, normal bowel sounds, no hepatosplenomegaly Extremities: no clubbing, cyanosis or edema; 2+ radial, ulnar and brachial pulses bilaterally; 2+ right femoral, posterior tibial and dorsalis pedis pulses; 2+ left femoral, posterior tibial and dorsalis pedis pulses; no subclavian or femoral bruits Neurological: grossly nonfocal Psych: euthymic mood, full affect   EKG:  EKG is ordered today. The ekg ordered today demonstrates  Normal sinus rhythm   Recent Labs: 06/25/2015: ALT 20; BUN 18.5; Creatinine 1.2*; HGB 13.1; Platelets 421*; Potassium 4.1; Sodium 141    Lipid Panel    Component Value Date/Time   CHOL 100 07/21/2013 0220   TRIG 52 07/21/2013 0220   HDL 44 07/21/2013 0220   CHOLHDL 2.3 07/21/2013 0220   VLDL 10 07/21/2013 0220   LDLCALC 46 07/21/2013 0220      Wt Readings from Last 3 Encounters:  08/17/15 223 lb (101.152 kg)  07/21/15 234 lb (106.142 kg)    06/25/15 229 lb 14.4 oz (104.282 kg)    .   ASSESSMENT AND PLAN:  1.  Hypertension, well-controlled 2.  Remote history of right renal artery stent 3.  Obesity and type 2 diabetes mellitus, medically managed;  Encourage weight loss 4.  Hyperlipidemia on statin therapy, will retrieve labs from Dr. Baird Cancer.  In 2014 on same medication she had excellent LDL levels 5.  History of syncope related to absence seizures  Current medicines are reviewed at length with the patient today.  The patient does not have concerns regarding medicines.  The following changes have been made:  no change  Labs/ tests ordered today include:   Orders Placed This Encounter  Procedures  . EKG 12-Lead    Patient Instructions  Dr.  Kie Calvin recommends that you schedule a follow-up appointment in: ONE YEAR        Mikael Spray, MD  08/18/2015 12:00 AM    Sanda Klein, MD, [facility] [phone] office [phone] pager

## 2015-08-17 NOTE — Patient Instructions (Signed)
Dr. Croitoru recommends that you schedule a follow-up appointment in: ONE YEAR   

## 2015-08-18 ENCOUNTER — Encounter: Payer: Self-pay | Admitting: Cardiovascular Disease

## 2015-08-27 ENCOUNTER — Encounter: Payer: Self-pay | Admitting: Cardiovascular Disease

## 2015-09-11 ENCOUNTER — Other Ambulatory Visit: Payer: Self-pay | Admitting: Neurology

## 2015-10-22 ENCOUNTER — Other Ambulatory Visit: Payer: Self-pay | Admitting: Hematology

## 2015-10-29 ENCOUNTER — Other Ambulatory Visit: Payer: BC Managed Care – PPO

## 2015-10-29 ENCOUNTER — Other Ambulatory Visit: Payer: Self-pay | Admitting: *Deleted

## 2015-10-29 ENCOUNTER — Encounter: Payer: Self-pay | Admitting: Hematology

## 2015-10-29 ENCOUNTER — Encounter: Payer: BC Managed Care – PPO | Admitting: Hematology

## 2015-10-29 NOTE — Progress Notes (Signed)
This encounter was created in error - please disregard.

## 2015-10-30 ENCOUNTER — Telehealth: Payer: Self-pay | Admitting: Hematology

## 2015-10-30 NOTE — Telephone Encounter (Signed)
cld Mrs. Scofield to adv to call our office to r/s missed appt

## 2015-11-04 ENCOUNTER — Telehealth: Payer: Self-pay | Admitting: Hematology

## 2015-11-04 NOTE — Telephone Encounter (Signed)
Spoke with patient re new lab/YF 1/20 - r/s from 1/5.

## 2015-11-13 ENCOUNTER — Other Ambulatory Visit (HOSPITAL_BASED_OUTPATIENT_CLINIC_OR_DEPARTMENT_OTHER): Payer: BC Managed Care – PPO

## 2015-11-13 ENCOUNTER — Encounter: Payer: Self-pay | Admitting: Hematology

## 2015-11-13 ENCOUNTER — Ambulatory Visit (HOSPITAL_BASED_OUTPATIENT_CLINIC_OR_DEPARTMENT_OTHER): Payer: BC Managed Care – PPO | Admitting: Hematology

## 2015-11-13 ENCOUNTER — Telehealth: Payer: Self-pay | Admitting: Hematology

## 2015-11-13 VITALS — BP 169/87 | HR 67 | Temp 98.3°F | Resp 18 | Ht 62.0 in | Wt 231.9 lb

## 2015-11-13 DIAGNOSIS — D473 Essential (hemorrhagic) thrombocythemia: Secondary | ICD-10-CM

## 2015-11-13 DIAGNOSIS — R569 Unspecified convulsions: Secondary | ICD-10-CM

## 2015-11-13 DIAGNOSIS — R55 Syncope and collapse: Secondary | ICD-10-CM | POA: Diagnosis not present

## 2015-11-13 LAB — COMPREHENSIVE METABOLIC PANEL
ALT: 23 U/L (ref 0–55)
ANION GAP: 8 meq/L (ref 3–11)
AST: 20 U/L (ref 5–34)
Albumin: 3.7 g/dL (ref 3.5–5.0)
Alkaline Phosphatase: 89 U/L (ref 40–150)
BUN: 14.2 mg/dL (ref 7.0–26.0)
CHLORIDE: 105 meq/L (ref 98–109)
CO2: 29 meq/L (ref 22–29)
CREATININE: 1.1 mg/dL (ref 0.6–1.1)
Calcium: 9.3 mg/dL (ref 8.4–10.4)
EGFR: 62 mL/min/{1.73_m2} — AB (ref >90)
GLUCOSE: 96 mg/dL (ref 70–140)
Potassium: 4.2 mEq/L (ref 3.5–5.1)
Sodium: 142 mEq/L (ref 136–145)
Total Bilirubin: 0.3 mg/dL (ref 0.20–1.20)
Total Protein: 7.2 g/dL (ref 6.4–8.3)

## 2015-11-13 LAB — CBC WITH DIFFERENTIAL/PLATELET
BASO%: 0.8 % (ref 0.0–2.0)
Basophils Absolute: 0.1 10*3/uL (ref 0.0–0.1)
EOS%: 3.7 % (ref 0.0–7.0)
Eosinophils Absolute: 0.3 10*3/uL (ref 0.0–0.5)
HCT: 40.4 % (ref 34.8–46.6)
HGB: 13.4 g/dL (ref 11.6–15.9)
LYMPH%: 28.7 % (ref 14.0–49.7)
MCH: 33.8 pg (ref 25.1–34.0)
MCHC: 33.1 g/dL (ref 31.5–36.0)
MCV: 102.1 fL — ABNORMAL HIGH (ref 79.5–101.0)
MONO#: 0.6 10*3/uL (ref 0.1–0.9)
MONO%: 8.6 % (ref 0.0–14.0)
NEUT#: 4 10*3/uL (ref 1.5–6.5)
NEUT%: 58.2 % (ref 38.4–76.8)
PLATELETS: 390 10*3/uL (ref 145–400)
RBC: 3.95 10*6/uL (ref 3.70–5.45)
RDW: 12.4 % (ref 11.2–14.5)
WBC: 6.9 10*3/uL (ref 3.9–10.3)
lymph#: 2 10*3/uL (ref 0.9–3.3)

## 2015-11-13 MED ORDER — HYDROXYUREA 500 MG PO CAPS: ORAL_CAPSULE | ORAL | Status: DC

## 2015-11-13 NOTE — Progress Notes (Signed)
[facility] OFFICE PROGRESS NOTE   Sara [name], [address] [place] [address]  DIAGNOSIS: Essential thrombocythemia [facility])  CC: follow up Essential Thrombocytosis  CURRENT THERAPY: Hydroxyurea 500 mg daily, started in 2010., increased to hydrea 1000mg  daily on Monday and  Thursday, and 500mg  daily for the rest of week in 01/2015,  Aspirin 81 mg daily.  INTERVAL HISTORY:  DAYVA MACBETH 60 y.o. female with history of JAK positive Essential thrombocythemia presents for follow up. She was last seen by me 3 month ago.  She has been doing very well since her last visit, no more syncope episodes. She saw a neurologist in [place] for second opinion a few months ago, and she still on [name] for seizure.  No other complaints. She feels well overall.  MEDICAL HISTORY: Past Medical History  Diagnosis Date  . Thrombocytosis ([place])   . Hypertension   . Diabetes mellitus   . Gout   . Renal artery stenosis ([place]) 12/13/2010    PTCA and Stent - right  . Syncopal episodes   . PAD (peripheral artery disease) ([place])   . Hyperlipemia   . Morbid obesity ([place])   . LVH (left ventricular hypertrophy)     12/21/09 Echo -mild asymmetric LVH,EF =>55%  . OSA (obstructive sleep apnea)      ALLERGIES:  is allergic to sulfa antibiotics.  MEDICATIONS:    Medication List       This list is accurate as of: 11/13/15  3:45 PM.  Always use your most recent med list.               aspirin EC 81 MG tablet  Take 81 mg by mouth daily.     atorvastatin 10 MG tablet  Commonly known as:  LIPITOR  Take 10 mg by mouth daily.     B COMPLEX PO  Take 1 tablet by mouth daily.     hydroxyurea 500 MG capsule  Commonly known as:  HYDREA  take 2  CAPSULES DAILY ON MONDAYS AND THURSDAYS AND 1 CAPSULE DAILY FOR THE REST OF THE WEEK     **MAY TAKE WITH FOOD TO MINIMIZE GI SIDE EF     Investigational - Study Medication  Take 100 mg by mouth daily. Research study  with Dr. Baird Cancer - pt may be receiving placebo or allopurinol Protocol [id number] 1 capsule [id number] 1 capsule Per pt - Allopurinol.     nebivolol 5 MG tablet  Commonly known as:  BYSTOLIC  Take 1 tablet (5 mg total) by mouth daily.     telmisartan 20 MG tablet  Commonly known as:  MICARDIS  Take 20 mg by mouth daily.     [name] 50 MG Tabs tablet  Generic drug:  lacosamide  take 1 tablet by mouth twice a day     Vitamin D3 2000 units capsule  Take 2,000 Units by mouth daily.        SURGICAL HISTORY:  Past Surgical History  Procedure Laterality Date  . Breast reduction surgery  1995    S/P Bilateral breast reduction  . Cesarean section  1980, 1986    Times  Two.  . Breast cyst excision      S/P Benign Right Breast Cyst Removal.  . Partial hysterectomy  1994  . Extremity cyst excision  1993    left wrist  . Renal artery stent placement Right     REVIEW OF SYSTEMS:   Constitutional: Denies fevers,  chills or abnormal weight loss Eyes: Denies blurriness of vision Ears, nose, mouth, throat, and face: Denies mucositis or sore throat Respiratory: Denies cough, dyspnea or wheezes Cardiovascular: Denies palpitation, chest discomfort or lower extremity swelling Gastrointestinal:  Denies nausea, heartburn ; she has constipation.  Skin: Denies abnormal skin rashes Lymphatics: Denies new lymphadenopathy or easy bruising Neurological:Denies numbness, tingling or new weaknesses Behavioral/Psych: Mood is stable, no new changes  All other systems were reviewed with the patient and are negative.  PHYSICAL EXAMINATION: ECOG PERFORMANCE STATUS: 0  Blood pressure 169/87, pulse 67, temperature 98.3 F (36.8 C), temperature source Oral, resp. rate 18, height 5\' 2"  (1.575 m), weight 231 lb 14.4 oz (105.189 kg), SpO2 98 %.  GENERAL:alert, no distress and comfortable; obese pleasant middle-age woman SKIN: skin color, texture, turgor are normal, no rashes or significant lesions  except a large ecchymosis in the left leg EYES: normal, Conjunctiva are pink and non-injected, sclera clear OROPHARYNX:no exudate, no erythema and lips, buccal mucosa, and tongue normal  NECK: supple, thyroid normal size, non-tender, without nodularity LYMPH:  no palpable lymphadenopathy in the cervical, axillary or inguinal LUNGS: clear to auscultation and percussion with normal breathing effort HEART: regular rate & rhythm and no murmurs and no lower extremity edema ABDOMEN:abdomen soft,  Mildly tender in the epigastric and normal bowel sounds Musculoskeletal:no cyanosis of digits and no clubbing  NEURO: alert & oriented x 3 with fluent speech, no focal motor/sensory deficits   LABORATORY DATA:  CBC Latest Ref Rng 11/13/2015 06/25/2015 04/24/2015  WBC 3.9 - 10.3 10e3/uL 6.9 6.4 5.2  Hemoglobin 11.6 - 15.9 g/dL 13.4 13.1 13.3  Hematocrit 34.8 - 46.6 % 40.4 38.9 39.8  Platelets 145 - 400 10e3/uL 390 421(H) 432(H)    CMP Latest Ref Rng 11/13/2015 06/25/2015 04/24/2015  Glucose 70 - 140 mg/dl 96 94 89  BUN 7.0 - 26.0 mg/dL 14.2 18.5 14.5  Creatinine 0.6 - 1.1 mg/dL 1.1 1.2(H) 1.1  Sodium 136 - 145 mEq/L 142 141 141  Potassium 3.5 - 5.1 mEq/L 4.2 4.1 4.4  Chloride 101 - 111 mmol/L - - -  CO2 22 - 29 mEq/L 29 29 26   Calcium 8.4 - 10.4 mg/dL 9.3 9.4 9.6  Total Protein 6.4 - 8.3 g/dL 7.2 6.8 6.8  Total Bilirubin 0.20 - 1.20 mg/dL 0.30 0.31 0.58  Alkaline Phos 40 - 150 U/L 89 83 79  AST 5 - 34 U/L 20 18 19   ALT 0 - 55 U/L 23 20 20    Pathology report 01/20/2009 BONE MARROW, RIGHT POSTERIOR ILIAC CREST, BIOPSY AND ASPIRATION: - CELLULAR MARROW WITH TRILINEAGE HEMATOPOIESIS AND MATURATION. - NO EVIDENCE OF EXCESS BLASTS.   BONE MARROW ASPIRATE: The aspirate material is composed of a few somewhat cellular bone marrow particles displaying a mixture of cell types. Erythroid and granulocytic series show orderly and progressive maturation. Megakaryocytes are abundant with predominantly  normal morphology.  BONE MARROW CORE BIOPSY: The bone marrow core biopsy has very prominent aspiration artifact. Sections show 40 to 60% cellularity with a mixture of cell types. The megakaryocytes are abundant with normal morphology. No megakaryocyte clustering is seen. The granulocytic series and erythroid series show orderly and progressive maturation. No excess blasts are seen.  Karyotype: 46,XX[20].nuc ish (ABL,BCR)x2  Interpretation: Cytogenetic Analysis:Normal: Cytogenetic analysis revealed the presence of normal female chromosomes with no observable clonal chromosomal abnormalities.  Molecular Cytogenetic Analysis FISH: Normal: The investigative technique of molecular cytogenetic analysis with DNA probes specific for the Major/Minor ABL (9q34) and BCR (22q11)  genes revealed the lack of significant number of BCR/ABL fusion signals. These results are consistent with a normal finding and a lack of the BCR/ABL fusion associated with CML/ALL.  RADIOGRAPHIC STUDIES:  No new scans   ASSESSMENT: Essential thrombocythemia. Diagnosed in 2010. JAK2 mutation was present. The patient underwent a bone marrow aspirate and biopsy on 01/20/2009 which yielded a specimen that was suboptimal for evaluation. However, megakaryocytes were abundant with normal morphology. There was no clustering or excess blasts seen. Platelet count on 01/20/2009 was 881. The patient is being treated with aspirin and hydroxyurea.  PLAN:  1. Essential Thrombocythemia. JAK2 (+)  --I reviewed her above laboratory results and history extensively. I explained to her this chronic disease, not curable but treatable. Small percentage patient will develop myelofibrosis or leukemia in late stage. The disease related risks are thrombosis, including stroke and heart attack. -Her platelet count has been stable, 390K today, which is well controlled. She previously concern if her syncope is related to hydrea, she  agree to continue hydrea at current dose for now  -We previously discussed alternative therapy option with anagrelide, and potential side effects. She does not want to switch at this point -Continue aspirin.  2. Seizure -cont meds, follow up   3. Syncope episode -Unclear etiology. -I suspect the possibility of her syncope secondary to Hydrea is low, but I certainly would consider changing her Hydrea to anagrelide if needed.   Follow-up.  -continue hydrea at current dose, I refilled for her today  -RTC with lab in 4 month   All questions were answered. The patient knows to call the clinic with any problems, questions or concerns. We can certainly see the patient much sooner if necessary.  --I spent 15 minutes counseling the patient face to face. The total time spent in the appointment was 20 minutes.    Truitt Merle  11/13/2015

## 2015-11-13 NOTE — Telephone Encounter (Signed)
per pof to sch pt appt-gave pt copy of avs °

## 2015-12-16 ENCOUNTER — Other Ambulatory Visit: Payer: Self-pay | Admitting: Internal Medicine

## 2015-12-16 DIAGNOSIS — K439 Ventral hernia without obstruction or gangrene: Secondary | ICD-10-CM

## 2015-12-30 ENCOUNTER — Ambulatory Visit
Admission: RE | Admit: 2015-12-30 | Discharge: 2015-12-30 | Disposition: A | Discharge status: Home / Self Care | Payer: BC Managed Care – PPO | Source: Ambulatory Visit | Attending: Internal Medicine | Admitting: Internal Medicine

## 2015-12-30 ENCOUNTER — Other Ambulatory Visit: Payer: BC Managed Care – PPO

## 2015-12-30 ENCOUNTER — Other Ambulatory Visit: Payer: Self-pay | Admitting: Internal Medicine

## 2015-12-30 DIAGNOSIS — K439 Ventral hernia without obstruction or gangrene: Secondary | ICD-10-CM

## 2016-01-19 ENCOUNTER — Encounter: Payer: Self-pay | Admitting: Neurology

## 2016-01-19 ENCOUNTER — Ambulatory Visit (INDEPENDENT_AMBULATORY_CARE_PROVIDER_SITE_OTHER): Payer: BC Managed Care – PPO | Admitting: Neurology

## 2016-01-19 VITALS — BP 154/72 | HR 72 | Ht 62.0 in | Wt 232.5 lb

## 2016-01-19 DIAGNOSIS — R569 Unspecified convulsions: Secondary | ICD-10-CM | POA: Diagnosis not present

## 2016-01-19 DIAGNOSIS — G4089 Other seizures: Secondary | ICD-10-CM | POA: Insufficient documentation

## 2016-01-19 DIAGNOSIS — G4733 Obstructive sleep apnea (adult) (pediatric): Secondary | ICD-10-CM

## 2016-01-19 NOTE — Progress Notes (Addendum)
Reason for visit: Seizures  Sara [name] is an 60 y.o. female  History of present illness:  Sara [name] is a 60 year old right-handed black female with a history of seizures that have been well controlled with the use of [name]. The patient is on 50 mg twice daily, she tolerates the medication well. She has not had a seizure in several years. She operates motor vehicle without difficulty. She has sleep apnea, she tolerates the CPAP quite well, she believes that she is sleeping well. She comes into the office today for an evaluation. She indicates that since last seen she has been found to have an abdominal hernia, she is not sure that she is going to have surgery for this.  Past Medical History  Diagnosis Date  . Thrombocytosis ([place])   . Hypertension   . Diabetes mellitus   . Gout   . Renal artery stenosis ([place]) 12/13/2010    PTCA and Stent - right  . Syncopal episodes   . PAD (peripheral artery disease) ([place])   . Hyperlipemia   . Morbid obesity ([place])   . LVH (left ventricular hypertrophy)     12/21/09 Echo -mild asymmetric LVH,EF =>55%  . OSA (obstructive sleep apnea)     Past Surgical History  Procedure Laterality Date  . Breast reduction surgery  1995    S/P Bilateral breast reduction  . Cesarean section  1980, 1986    Times  Two.  . Breast cyst excision      S/P Benign Right Breast Cyst Removal.  . Partial hysterectomy  1994  . Extremity cyst excision  1993    left wrist  . Renal artery stent placement Right     Family History  Problem Relation Age of Onset  . Breast cancer Maternal Aunt     2 maternal aunts had breast cancer.  Marland Kitchen Heart failure Mother   . Diabetes Father   . Diabetes Sister   . Hypertension Sister   . Breast cancer Maternal Grandmother   . Hypertension Sister   . Hypertension Sister     Social history:  reports that she has never smoked. She has never used smokeless tobacco. She reports that she does not drink alcohol or use illicit  drugs.    Allergies  Allergen Reactions  . Sulfa Antibiotics Itching    Medications:  Prior to Admission medications   Medication Sig Start Date End Date Taking? Authorizing Provider  aspirin EC 81 MG tablet Take 81 mg by mouth daily.   Yes Historical Provider, MD  atorvastatin (LIPITOR) 10 MG tablet Take 10 mg by mouth daily. 06/08/15  Yes Historical Provider, MD  B Complex Vitamins (B COMPLEX PO) Take 1 tablet by mouth daily.    Yes Historical Provider, MD  Cholecalciferol (VITAMIN D3) 2000 UNITS capsule Take 2,000 Units by mouth daily.    Yes Historical Provider, MD  hydroxyurea (HYDREA) 500 MG capsule take 2  CAPSULES DAILY ON MONDAYS AND THURSDAYS AND 1 CAPSULE DAILY FOR THE REST OF THE WEEK     **MAY TAKE WITH FOOD TO MINIMIZE GI SIDE EF 11/13/15  Yes Truitt Merle, MD  nebivolol (BYSTOLIC) 5 MG tablet Take 1 tablet (5 mg total) by mouth daily. 08/17/15  Yes Mihai Croitoru, MD  telmisartan (MICARDIS) 20 MG tablet Take 20 mg by mouth daily. 05/19/15  Yes Historical Provider, MD  [name] 50 MG TABS tablet take 1 tablet by mouth twice a day 09/11/15  Yes [name], MD  ROS:  Out of a complete 14 system review of symptoms, the patient complains only of the following symptoms, and all other reviewed systems are negative.  History of seizures  Blood pressure 154/72, pulse 72, height 5\' 2"  (1.575 m), weight 232 lb 8 oz (105.461 kg).  Physical Exam  General: The patient is alert and cooperative at the time of the examination. The patient is moderately to markedly obese.  Skin: No significant peripheral edema is noted.   Neurologic Exam  Mental status: The patient is alert and oriented x 3 at the time of the examination. The patient has apparent normal recent and remote memory, with an apparently normal attention span and concentration ability.   Cranial nerves: Facial symmetry is present. Speech is normal, no aphasia or dysarthria is noted. Extraocular movements are full.  Visual fields are full.  Motor: The patient has good strength in all 4 extremities.  Sensory examination: Soft touch sensation is symmetric on the face, arms, and legs.  Coordination: The patient has good finger-nose-finger and heel-to-shin bilaterally.  Gait and station: The patient has a normal gait. Tandem gait is normal. Romberg is negative. No drift is seen.  Reflexes: Deep tendon reflexes are symmetric.   Assessment/Plan:  1. History of seizures, well controlled  2. Sleep apnea on CPAP  The patient is doing quite well to her seizures. The patient is on [name] which will be continued. She will follow-up in one year, sooner if needed. She is to contact us if she has any concerns.  Jill Alexanders MD 01/19/2016 9:58 AM  [facility] [address] [place], [place] [address]  Phone [phone] Fax [phone]

## 2016-02-22 ENCOUNTER — Telehealth: Payer: Self-pay | Admitting: Hematology

## 2016-02-22 NOTE — Telephone Encounter (Signed)
pt cld to r/s appt-gave pt copy of avs

## 2016-02-23 ENCOUNTER — Telehealth: Payer: Self-pay | Admitting: Cardiovascular Disease

## 2016-02-23 NOTE — Telephone Encounter (Signed)
New message   Pt is calling for samples  Patient calling the office for samples of medication:   1.  What medication and dosage are you requesting samples for?  Diastolic   2.  Are you currently out of this medication? 2 pills left

## 2016-02-24 NOTE — Telephone Encounter (Signed)
Patient aware samples are at the front desk for pick up  

## 2016-03-04 ENCOUNTER — Telehealth: Payer: Self-pay | Admitting: Hematology

## 2016-03-04 NOTE — Telephone Encounter (Signed)
cld & spoke to pt and gave pt tome & date of r/s appt-pt understood

## 2016-03-09 ENCOUNTER — Other Ambulatory Visit (HOSPITAL_BASED_OUTPATIENT_CLINIC_OR_DEPARTMENT_OTHER): Payer: BC Managed Care – PPO

## 2016-03-09 ENCOUNTER — Other Ambulatory Visit: Payer: Self-pay

## 2016-03-09 ENCOUNTER — Telehealth: Payer: Self-pay | Admitting: Hematology

## 2016-03-09 ENCOUNTER — Encounter: Payer: Self-pay | Admitting: Hematology

## 2016-03-09 ENCOUNTER — Other Ambulatory Visit: Payer: BC Managed Care – PPO

## 2016-03-09 ENCOUNTER — Ambulatory Visit (HOSPITAL_BASED_OUTPATIENT_CLINIC_OR_DEPARTMENT_OTHER): Payer: BC Managed Care – PPO | Admitting: Hematology

## 2016-03-09 ENCOUNTER — Ambulatory Visit: Payer: BC Managed Care – PPO | Admitting: Hematology

## 2016-03-09 VITALS — BP 159/48 | HR 59 | Temp 98.2°F | Resp 17 | Ht 62.0 in | Wt 233.5 lb

## 2016-03-09 DIAGNOSIS — D473 Essential (hemorrhagic) thrombocythemia: Secondary | ICD-10-CM | POA: Diagnosis not present

## 2016-03-09 DIAGNOSIS — R569 Unspecified convulsions: Secondary | ICD-10-CM | POA: Diagnosis not present

## 2016-03-09 DIAGNOSIS — R55 Syncope and collapse: Secondary | ICD-10-CM | POA: Diagnosis not present

## 2016-03-09 LAB — COMPREHENSIVE METABOLIC PANEL
ALT: 18 U/L (ref 0–55)
AST: 16 U/L (ref 5–34)
Albumin: 3.5 g/dL (ref 3.5–5.0)
Alkaline Phosphatase: 76 U/L (ref 40–150)
Anion Gap: 6 mEq/L (ref 3–11)
BUN: 17.3 mg/dL (ref 7.0–26.0)
CALCIUM: 9 mg/dL (ref 8.4–10.4)
CO2: 28 meq/L (ref 22–29)
CREATININE: 1 mg/dL (ref 0.6–1.1)
Chloride: 105 mEq/L (ref 98–109)
EGFR: 70 mL/min/{1.73_m2} — ABNORMAL LOW (ref >90)
GLUCOSE: 91 mg/dL (ref 70–140)
POTASSIUM: 4 meq/L (ref 3.5–5.1)
SODIUM: 139 meq/L (ref 136–145)
Total Bilirubin: 0.32 mg/dL (ref 0.20–1.20)
Total Protein: 6.9 g/dL (ref 6.4–8.3)

## 2016-03-09 LAB — CBC WITH DIFFERENTIAL/PLATELET
BASO%: 1.3 % (ref 0.0–2.0)
BASOS ABS: 0.1 10*3/uL (ref 0.0–0.1)
EOS%: 3.6 % (ref 0.0–7.0)
Eosinophils Absolute: 0.2 10*3/uL (ref 0.0–0.5)
HEMATOCRIT: 39.8 % (ref 34.8–46.6)
HGB: 13.3 g/dL (ref 11.6–15.9)
LYMPH%: 32.6 % (ref 14.0–49.7)
MCH: 34.4 pg — AB (ref 25.1–34.0)
MCHC: 33.3 g/dL (ref 31.5–36.0)
MCV: 103.2 fL — ABNORMAL HIGH (ref 79.5–101.0)
MONO#: 0.4 10*3/uL (ref 0.1–0.9)
MONO%: 8.2 % (ref 0.0–14.0)
NEUT#: 2.7 10*3/uL (ref 1.5–6.5)
NEUT%: 54.3 % (ref 38.4–76.8)
Platelets: 354 10*3/uL (ref 145–400)
RBC: 3.86 10*6/uL (ref 3.70–5.45)
RDW: 12.3 % (ref 11.2–14.5)
WBC: 5 10*3/uL (ref 3.9–10.3)
lymph#: 1.6 10*3/uL (ref 0.9–3.3)

## 2016-03-09 MED ORDER — HYDROXYUREA 500 MG PO CAPS: ORAL_CAPSULE | ORAL | Status: DC

## 2016-03-09 MED ORDER — LACOSAMIDE 50 MG PO TABS: 50.0000 mg | ORAL_TABLET | Freq: Two times a day (BID) | ORAL | Status: DC

## 2016-03-09 NOTE — Progress Notes (Signed)
[facility] OFF[facility] PROGRESS NOTE   [name], [address] [place] [address]  DIAGNOSIS: Essential thrombocythemia [facility]) - Plan: hydroxyurea (HYDREA) 500 MG capsule  CC: follow up Essential Thrombocytosis  CURRENT THERAPY: Hydroxyurea 500 mg daily, started in 2010., increased to hydrea 1000mg  daily on Monday and  Thursday, and 500mg  daily for the rest of week in 01/2015,  Aspirin 81 mg daily.  INTERVAL HISTORY:   Sara [name] 60 y.o. female with history of JAK positive Essential thrombocythemia presents for follow up. She was last seen by me 4 month ago. She is doing well overall, no episodes of syncope since last visit, still has occasional dizziness when she is hungry, resolves after eating. She is compliant with Hydrea, tolerating well, no noticeable side effects. No other new events.   MEDICAL HISTORY: Past Medical History  Diagnosis Date  . Thrombocytosis ([place])   . Hypertension   . Diabetes mellitus   . Gout   . Renal artery stenosis ([place]) 12/13/2010    PTCA and Stent - right  . Syncopal episodes   . PAD (peripheral artery disease) ([place])   . Hyperlipemia   . Morbid obesity ([place])   . LVH (left ventricular hypertrophy)     12/21/09 Echo -mild asymmetric LVH,EF =>55%  . OSA (obstructive sleep apnea)      ALLERGIES:  is allergic to sulfa antibiotics.  MEDICATIONS:    Medication List       This list is accurate as of: 03/09/16  7:51 PM.  Always use your most recent med list.               aspirin EC 81 MG tablet  Take 81 mg by mouth daily.     atorvastatin 10 MG tablet  Commonly known as:  LIPITOR  Take 10 mg by mouth daily.     B COMPLEX PO  Take 1 tablet by mouth daily.     hydroxyurea 500 MG capsule  Commonly known as:  HYDREA  take 2  CAPSULES DAILY ON MONDAYS AND THURSDAYS AND 1 CAPSULE DAILY FOR THE REST OF THE WEEK     **MAY TAKE WITH FOOD TO MINIMIZE GI SIDE EF     lacosamide 50 MG Tabs tablet   Commonly known as:  [name]  Take 1 tablet (50 mg total) by mouth 2 (two) times daily.     nebivolol 5 MG tablet  Commonly known as:  BYSTOLIC  Take 1 tablet (5 mg total) by mouth daily.     telmisartan 20 MG tablet  Commonly known as:  MICARDIS  Take 20 mg by mouth daily.     Vitamin D3 2000 units capsule  Take 2,000 Units by mouth daily.        SURGICAL HISTORY:  Past Surgical History  Procedure Laterality Date  . Breast reduction surgery  1995    S/P Bilateral breast reduction  . Cesarean section  1980, 1986    Times  Two.  . Breast cyst excision      S/P Benign Right Breast Cyst Removal.  . Partial hysterectomy  1994  . Extremity cyst excision  1993    left wrist  . Renal artery stent placement Right     REVIEW OF SYSTEMS:   Constitutional: Denies fevers, chills or abnormal weight loss Eyes: Denies blurriness of vision Ears, nose, mouth, throat, and face: Denies mucositis or sore throat Respiratory: Denies cough, dyspnea or wheezes Cardiovascular: Denies palpitation, chest  discomfort or lower extremity swelling Gastrointestinal:  Denies nausea, heartburn ; she has constipation.  Skin: Denies abnormal skin rashes Lymphatics: Denies new lymphadenopathy or easy bruising Neurological:Denies numbness, tingling or new weaknesses Behavioral/Psych: Mood is stable, no new changes  All other systems were reviewed with the patient and are negative.  PHYSICAL EXAMINATION: ECOG PERFORMANCE STATUS: 0  Blood pressure 159/48, pulse 59, temperature 98.2 F (36.8 C), temperature source Oral, resp. rate 17, height 5\' 2"  (1.575 m), weight 233 lb 8 oz (105.915 kg), SpO2 99 %.  GENERAL:alert, no distress and comfortable; obese pleasant middle-age woman SKIN: skin color, texture, turgor are normal, no rashes or significant lesions except a large ecchymosis in the left leg EYES: normal, Conjunctiva are pink and non-injected, sclera clear OROPHARYNX:no exudate, no erythema and lips,  buccal mucosa, and tongue normal  NECK: supple, thyroid normal size, non-tender, without nodularity LYMPH:  no palpable lymphadenopathy in the cervical, axillary or inguinal LUNGS: clear to auscultation and percussion with normal breathing effort HEART: regular rate & rhythm and no murmurs and no lower extremity edema ABDOMEN:abdomen soft,  Mildly tender in the epigastric and normal bowel sounds Musculoskeletal:no cyanosis of digits and no clubbing  NEURO: alert & oriented x 3 with fluent speech, no focal motor/sensory deficits   LABORATORY DATA:  CBC Latest Ref Rng 03/09/2016 11/13/2015 06/25/2015  WBC 3.9 - 10.3 10e3/uL 5.0 6.9 6.4  Hemoglobin 11.6 - 15.9 g/dL 13.3 13.4 13.1  Hematocrit 34.8 - 46.6 % 39.8 40.4 38.9  Platelets 145 - 400 10e3/uL 354 390 421(H)    CMP Latest Ref Rng 03/09/2016 11/13/2015 06/25/2015  Glucose 70 - 140 mg/dl 91 96 94  BUN 7.0 - 26.0 mg/dL 17.3 14.2 18.5  Creatinine 0.6 - 1.1 mg/dL 1.0 1.1 1.2(H)  Sodium 136 - 145 mEq/L 139 142 141  Potassium 3.5 - 5.1 mEq/L 4.0 4.2 4.1  CO2 22 - 29 mEq/L 28 29 29   Calcium 8.4 - 10.4 mg/dL 9.0 9.3 9.4  Total Protein 6.4 - 8.3 g/dL 6.9 7.2 6.8  Total Bilirubin 0.20 - 1.20 mg/dL 0.32 0.30 0.31  Alkaline Phos 40 - 150 U/L 76 89 83  AST 5 - 34 U/L 16 20 18   ALT 0 - 55 U/L 18 23 20    Pathology report 01/20/2009 BONE MARROW, RIGHT POSTERIOR ILIAC CREST, BIOPSY AND ASPIRATION: - CELLULAR MARROW WITH TRILINEAGE HEMATOPOIESIS AND MATURATION. - NO EVIDENCE OF EXCESS BLASTS.   BONE MARROW ASPIRATE: The aspirate material is composed of a few somewhat cellular bone marrow particles displaying a mixture of cell types. Erythroid and granulocytic series show orderly and progressive maturation. Megakaryocytes are abundant with predominantly normal morphology.  BONE MARROW CORE BIOPSY: The bone marrow core biopsy has very prominent aspiration artifact. Sections show 40 to 60% cellularity with a mixture of cell types. The  megakaryocytes are abundant with normal morphology. No megakaryocyte clustering is seen. The granulocytic series and erythroid series show orderly and progressive maturation. No excess blasts are seen.  Karyotype: 46,XX[20].nuc ish (ABL,BCR)x2  Interpretation: Cytogenetic Analysis:Normal: Cytogenetic analysis revealed the presence of normal female chromosomes with no observable clonal chromosomal abnormalities.  Molecular Cytogenetic Analysis FISH: Normal: The investigative technique of molecular cytogenetic analysis with DNA probes specific for the Major/Minor ABL (9q34) and BCR (22q11) genes revealed the lack of significant number of BCR/ABL fusion signals. These results are consistent with a normal finding and a lack of the BCR/ABL fusion associated with CML/ALL.  RADIOGRAPHIC STUDIES:  No new scans   ASSESSMENT: Essential  thrombocythemia. Diagnosed in 2010. JAK2 mutation was present. The patient underwent a bone marrow aspirate and biopsy on 01/20/2009 which yielded a specimen that was suboptimal for evaluation. However, megakaryocytes were abundant with normal morphology. There was no clustering or excess blasts seen. Platelet count on 01/20/2009 was 881. The patient is being treated with aspirin and hydroxyurea.  PLAN:  1. Essential Thrombocythemia. JAK2 (+)  --I reviewed her above laboratory results and history extensively. I explained to her this chronic disease, not curable but treatable. Small percentage patient will develop myelofibrosis or leukemia in late stage. The disease related risk is thrombosis, including stroke and heart attack. -Her platelet count has been well controlled, 354K today. She previous syncope is unlikely related to hydrea, she agree to continue hydrea  -We previously discussed alternative therapy option with anagrelide, and potential side effects. She does not want to switch at this point -Continue aspirin.  2. Seizure -cont meds,  follow up with her neurologist   3. Syncope episode -Unclear etiology. -I suspect the possibility of her syncope is unlikely related Hydrea.  Follow-up.  -continue hydrea at current dose -RTC with lab in 4 month with lab   All questions were answered. The patient knows to call the clinic with any problems, questions or concerns. We can certainly see the patient much sooner if necessary.  --I spent 15 minutes counseling the patient face to face. The total time spent in the appointment was 20 minutes.    Truitt Merle  03/09/2016

## 2016-03-09 NOTE — Telephone Encounter (Signed)
per pof to sch pt appt-gave pt copy of avs °

## 2016-03-09 NOTE — Telephone Encounter (Signed)
Last OV 01/19/16 w/ f/u scheduled in 1 yr. Last filled for 6 mos on 09/11/15.

## 2016-03-10 ENCOUNTER — Ambulatory Visit: Payer: BC Managed Care – PPO | Admitting: Neurology

## 2016-03-10 ENCOUNTER — Telehealth: Payer: Self-pay

## 2016-03-10 NOTE — Telephone Encounter (Signed)
Patient did not show to appt today  

## 2016-03-10 NOTE — Telephone Encounter (Signed)
Rx printed, signed, faxed to pharmacy. 

## 2016-03-11 ENCOUNTER — Encounter: Payer: Self-pay | Admitting: Neurology

## 2016-03-14 ENCOUNTER — Ambulatory Visit: Payer: BC Managed Care – PPO | Admitting: Hematology

## 2016-03-14 ENCOUNTER — Other Ambulatory Visit: Payer: BC Managed Care – PPO

## 2016-04-27 ENCOUNTER — Telehealth: Payer: Self-pay | Admitting: Cardiovascular Disease

## 2016-04-27 MED ORDER — NEBIVOLOL HCL 5 MG PO TABS: 5.0000 mg | ORAL_TABLET | Freq: Every day | ORAL | Status: DC

## 2016-04-27 NOTE — Telephone Encounter (Signed)
New message      Patient calling the office for samples of medication:   1.  What medication and dosage are you requesting samples for?  bystolc 5mg   2.  Are you currently out of this medication? Almost out

## 2016-04-27 NOTE — Telephone Encounter (Signed)
Left patient msg informing her samples available for pickup.

## 2016-06-17 ENCOUNTER — Telehealth: Payer: Self-pay | Admitting: Cardiovascular Disease

## 2016-06-17 NOTE — Telephone Encounter (Signed)
Medication samples have been provided to the patient.  Drug name: Bystolic 5mg   Qty: 28  LOT: [id number]    Exp.Date: 4/19  Samples left at front desk for patient pick-up. Patient notified.   [name] 9:36 AM 06/17/2016

## 2016-06-17 NOTE — Telephone Encounter (Signed)
New Message  Patient calling the office for samples of medication:   1.  What medication and dosage are you requesting samples for? bystolic 5mg   2.  Are you currently out of this medication? 5 left

## 2016-07-11 ENCOUNTER — Encounter: Payer: BC Managed Care – PPO | Admitting: Hematology

## 2016-07-11 ENCOUNTER — Other Ambulatory Visit: Payer: BC Managed Care – PPO

## 2016-07-11 NOTE — Progress Notes (Signed)
No show  This encounter was created in error - please disregard.

## 2016-07-14 ENCOUNTER — Telehealth: Payer: Self-pay | Admitting: Hematology

## 2016-07-14 NOTE — Telephone Encounter (Signed)
R/s appt per LOS. Letter mailed 9/21

## 2016-07-15 NOTE — Telephone Encounter (Signed)
Patient walked in office requesting Bystolic 5 mg samples.Bystolic 5 mg samples given to patient.

## 2016-08-03 ENCOUNTER — Other Ambulatory Visit: Payer: Self-pay | Admitting: *Deleted

## 2016-08-03 DIAGNOSIS — D473 Essential (hemorrhagic) thrombocythemia: Secondary | ICD-10-CM

## 2016-08-03 MED ORDER — HYDROXYUREA 500 MG PO CAPS: ORAL_CAPSULE | ORAL | 3 refills | Status: DC

## 2016-08-04 ENCOUNTER — Encounter (HOSPITAL_COMMUNITY): Payer: Self-pay | Admitting: Emergency Medicine

## 2016-08-04 ENCOUNTER — Emergency Department (HOSPITAL_COMMUNITY)
Admission: EM | Admit: 2016-08-04 | Discharge: 2016-08-04 | Disposition: A | Discharge status: Home / Self Care | Payer: Worker's Compensation | Attending: Emergency Medicine | Admitting: Emergency Medicine

## 2016-08-04 DIAGNOSIS — I110 Hypertensive heart disease with heart failure: Secondary | ICD-10-CM | POA: Diagnosis not present

## 2016-08-04 DIAGNOSIS — Z79899 Other long term (current) drug therapy: Secondary | ICD-10-CM | POA: Diagnosis not present

## 2016-08-04 DIAGNOSIS — Z7982 Long term (current) use of aspirin: Secondary | ICD-10-CM | POA: Diagnosis not present

## 2016-08-04 DIAGNOSIS — R569 Unspecified convulsions: Secondary | ICD-10-CM | POA: Insufficient documentation

## 2016-08-04 DIAGNOSIS — I5030 Unspecified diastolic (congestive) heart failure: Secondary | ICD-10-CM | POA: Insufficient documentation

## 2016-08-04 DIAGNOSIS — E119 Type 2 diabetes mellitus without complications: Secondary | ICD-10-CM | POA: Insufficient documentation

## 2016-08-04 DIAGNOSIS — R42 Dizziness and giddiness: Secondary | ICD-10-CM | POA: Diagnosis present

## 2016-08-04 LAB — BASIC METABOLIC PANEL
Anion gap: 6 (ref 5–15)
BUN: 20 mg/dL (ref 6–20)
CALCIUM: 8.9 mg/dL (ref 8.9–10.3)
CHLORIDE: 106 mmol/L (ref 101–111)
CO2: 27 mmol/L (ref 22–32)
CREATININE: 1.16 mg/dL — AB (ref 0.44–1.00)
GFR calc Af Amer: 58 mL/min — ABNORMAL LOW (ref >60)
GFR calc non Af Amer: 50 mL/min — ABNORMAL LOW (ref >60)
GLUCOSE: 104 mg/dL — AB (ref 65–99)
Potassium: 4 mmol/L (ref 3.5–5.1)
Sodium: 139 mmol/L (ref 135–145)

## 2016-08-04 LAB — URINALYSIS, ROUTINE W REFLEX MICROSCOPIC
Bilirubin Urine: NEGATIVE
Glucose, UA: NEGATIVE mg/dL
Hgb urine dipstick: NEGATIVE
KETONES UR: NEGATIVE mg/dL
LEUKOCYTES UA: NEGATIVE
NITRITE: NEGATIVE
PH: 7 (ref 5.0–8.0)
PROTEIN: NEGATIVE mg/dL
Specific Gravity, Urine: 1.005 (ref 1.005–1.030)

## 2016-08-04 LAB — CBC
HCT: 36.4 % (ref 36.0–46.0)
HEMOGLOBIN: 12.6 g/dL (ref 12.0–15.0)
MCH: 34.7 pg — AB (ref 26.0–34.0)
MCHC: 34.6 g/dL (ref 30.0–36.0)
MCV: 100.3 fL — AB (ref 78.0–100.0)
PLATELETS: 367 10*3/uL (ref 150–400)
RBC: 3.63 MIL/uL — ABNORMAL LOW (ref 3.87–5.11)
RDW: 12 % (ref 11.5–15.5)
WBC: 6.4 10*3/uL (ref 4.0–10.5)

## 2016-08-04 LAB — CBG MONITORING, ED: Glucose-Capillary: 91 mg/dL (ref 65–99)

## 2016-08-04 NOTE — ED Triage Notes (Signed)
Pt reports dizziness started 1 pm, near syncope . denies headache, nor vision changes. Denies weakness. Pt alert and oriented x 4. Ambulated to triage room without difficulty. Hx HTN. Pt reports wearing heart monitor .

## 2016-08-04 NOTE — Discharge Instructions (Signed)
Read the information below.  Your labs so a mild elevation in your creatinine, a measure of kidney function, be sure to stay well hydrated.  Use the prescribed medication as directed.  Please discuss all new medications with your pharmacist.   It is very important that you follow up with your neurologist and your primary provider. Please call first thing tomorrow morning to schedule a follow up appointment. Do not drive until re-evaluation, by neurology.  Continue to take your medications as prescribed.   You may return to the Emergency Department at any time for worsening condition or any new symptoms that concern you. Return to ED if you develop fever, chest pain, shortness of breath, new neurologic symptoms or any new/concerning symptoms.

## 2016-08-04 NOTE — ED Provider Notes (Signed)
[place] DEPT Provider Note   CSN: [id number] Arrival date & time: 08/04/16  1742     History   Chief Complaint Chief Complaint  Patient presents with  . Dizziness    HPI Sara [name] is a 60 y.o. female.  Sara [name] is a 60 y.o. female with h/o HTN, HLD, 123456, OSA,  Diastolic CHF last echo 123456 ED 55-60%, absence seizure presents to ED with complaint of near syncope. Patient reports this afternoon she was talking with friends when a "funny feeling" came over her. She reports she went into a "staring episode." Per her co-workers they tried to communicate with her, she responded; however, was not making sense. Gradually she regained alertness and was able to respond coherently. Patient was sitting in a chair when the episode happened. She did not lose postural tone. Patient denies loss of consciousness, falls, or head trauma. No jerking movements noted, tongue biting, or incontinence. She denies fever, chest pain, SOB, dizziness, lightheadedness, numbness, weakness, facial droop, slurred speech, abdominal pain, N/V/D, dysuria, hematuria, or rash. Patient reports she has had repeat episodes similar and is being monitored by her PCP, Dr. Baird Cancer, and Neurologist, Dr. Jannifer Franklin. She is currently on medication, vimpat,  for possible seizures. She states the staring episode is consistent with her absence seizures, reports she has not had an absence seizure since starting vimpat, which she endorses taking regularly. She is also currently wearing a heart monitor that she is to remove tomorrow and send for evaluation.        Past Medical History:  Diagnosis Date  . Diabetes mellitus   . Gout   . Hyperlipemia   . Hypertension   . LVH (left ventricular hypertrophy)    12/21/09 Echo -mild asymmetric LVH,EF =>55%  . Morbid obesity ([place])   . OSA (obstructive sleep apnea)   . PAD (peripheral artery disease) ([place])   . Renal artery stenosis ([place]) 12/13/2010   PTCA and Stent - right  .  Syncopal episodes   . Thrombocytosis [facility])     Patient Active Problem List   Diagnosis Date Noted  . Seizures ([place]) 01/19/2016  . Typical absence seizures 03/28/2014  . Sleep apnea with use of continuous positive airway pressure (CPAP) 01/30/2014  . Hyperlipidemia 01/04/2014  . OSA (obstructive sleep apnea) 10/28/2013  . Syncope 07/21/2013  . Type II or unspecified type diabetes mellitus without mention of complication, not stated as uncontrolled 07/20/2013  . Acute renal failure ([place]) 07/20/2013  . History of renal stent 07/20/2013  . Orthostatic hypotension 07/20/2013  . Diastolic CHF ([place]) [id number]  . Essential thrombocythemia ([place]) 04/19/2012  . Hypertension 04/19/2012  . Renal artery stenosis (right) s/p stent 2012 12/13/2010    Past Surgical History:  Procedure Laterality Date  . BREAST CYST EXCISION     S/P Benign Right Breast Cyst Removal.  . BREAST REDUCTION SURGERY  1995   S/P Bilateral breast reduction  . [place]   Times  Two.  Marland Kitchen EXTREMITY CYST EXCISION  1993   left wrist  . PARTIAL HYSTERECTOMY  1994  . renal artery stent placement Right     OB History    No data available       Home Medications    Prior to Admission medications   Medication Sig Start Date End Date Taking? Authorizing Provider  aspirin EC 81 MG tablet Take 81 mg by mouth daily.   Yes Historical Provider, MD  atorvastatin (LIPITOR) 10 MG tablet Take  10 mg by mouth daily. Tuesday, Wednesday, Friday, Saturday, and Sunday. 06/08/15  Yes Historical Provider, MD  B Complex Vitamins (B COMPLEX PO) Take 1 tablet by mouth daily.    Yes Historical Provider, MD  Cholecalciferol (VITAMIN D3) 2000 UNITS capsule Take 2,000 Units by mouth daily.    Yes Historical Provider, MD  hydroxyurea (HYDREA) 500 MG capsule take 2  CAPSULES DAILY ON MONDAYS AND THURSDAYS AND 1 CAPSULE DAILY FOR THE REST OF THE WEEK     **MAY TAKE WITH FOOD TO MINIMIZE GI SIDE EF 08/03/16  Yes Truitt Merle, MD    lacosamide ([name]) 50 MG TABS tablet Take 1 tablet (50 mg total) by mouth 2 (two) times daily. 03/09/16  Yes [name], MD  nebivolol (BYSTOLIC) 5 MG tablet Take 1 tablet (5 mg total) by mouth daily. 04/27/16  Yes Mihai Croitoru, MD  telmisartan (MICARDIS) 20 MG tablet Take 20 mg by mouth 2 (two) times daily.  05/19/15  Yes Historical Provider, MD  Doxepin HCl 5 % CREA Apply 1 g topically 3 (three) times daily as needed (Shoulder pain).  06/13/16   Historical Provider, MD    Family History Family History  Problem Relation Age of Onset  . Heart failure Mother   . Diabetes Father   . Diabetes Sister   . Hypertension Sister   . Hypertension Sister   . Hypertension Sister   . Breast cancer Maternal Aunt     2 maternal aunts had breast cancer.  . Breast cancer Maternal Grandmother     Social History Social History  Substance Use Topics  . Smoking status: Never Smoker  . Smokeless tobacco: Never Used  . Alcohol use No     Allergies   Sulfa antibiotics   Review of Systems Review of Systems  Constitutional: Negative for chills, diaphoresis and fever.  HENT: Negative for trouble swallowing.   Eyes: Negative for visual disturbance.  Respiratory: Negative for cough and shortness of breath.   Cardiovascular: Negative for chest pain and leg swelling.  Gastrointestinal: Negative for abdominal pain, diarrhea, nausea and vomiting.  Genitourinary: Negative for dysuria and hematuria.  Musculoskeletal: Negative for arthralgias and myalgias.  Skin: Negative for rash.  Neurological: Positive for seizures. Negative for dizziness, syncope, facial asymmetry, speech difficulty, weakness, light-headedness, numbness and headaches.     Physical Exam Updated Vital Signs BP 162/78 (BP Location: Right Arm)   Pulse 68   Temp 98.3 F (36.8 C) (Oral)   Resp 24   Ht 5\' 2"  (1.575 m)   Wt 103 kg   SpO2 97%   BMI 41.52 kg/m   Physical Exam  Constitutional: She appears well-developed and  well-nourished. No distress.  HENT:  Head: Normocephalic and atraumatic.  Mouth/Throat: Oropharynx is clear and moist. No oropharyngeal exudate.  No tongue wound noted  Eyes: Conjunctivae and EOM are normal. Pupils are equal, round, and reactive to light. Right eye exhibits no discharge. Left eye exhibits no discharge. No scleral icterus.  Neck: Normal range of motion and phonation normal. Neck supple. No neck rigidity. Normal range of motion present.  Cardiovascular: Normal rate, regular rhythm, normal heart sounds and intact distal pulses.   No murmur heard. Pulmonary/Chest: Effort normal and breath sounds normal. No stridor. No respiratory distress. She has no wheezes. She has no rales.  Abdominal: Soft. Bowel sounds are normal. She exhibits no distension. There is no tenderness. There is no rigidity, no rebound, no guarding and no CVA tenderness.  Musculoskeletal: Normal range  of motion.  Lymphadenopathy:    She has no cervical adenopathy.  Neurological: She is alert. She is not disoriented. Coordination and gait normal. GCS eye subscore is 4. GCS verbal subscore is 5. GCS motor subscore is 6.  Mental Status:  Alert, thought content appropriate, able to give a coherent history. Speech fluent without evidence of aphasia. Able to follow 2 step commands without difficulty.  Cranial Nerves:  II:  Peripheral visual fields grossly normal, pupils equal, round, reactive to light III,IV, VI: ptosis not present, extra-ocular motions intact bilaterally  V,VII: smile symmetric, facial light touch sensation equal VIII: hearing grossly normal to voice  X: uvula elevates symmetrically  XI: bilateral shoulder shrug symmetric and strong XII: midline tongue extension without fassiculations Motor:  Normal tone. 5/5 in upper and lower extremities bilaterally including strong and equal grip strength and dorsiflexion/plantar flexion Sensory: light touch normal in all extremities. Cerebellar: normal  finger-to-nose with bilateral upper extremities Gait: normal gait and balance CV: distal pulses palpable throughout   Skin: Skin is warm and dry. She is not diaphoretic.  Psychiatric: She has a normal mood and affect. Her behavior is normal.     ED Treatments / Results  Labs (all labs ordered are listed, but only abnormal results are displayed) Labs Reviewed  BASIC METABOLIC PANEL - Abnormal; Notable for the following:       Result Value   Glucose, Bld 104 (*)    Creatinine, Ser 1.16 (*)    GFR calc non Af Amer 50 (*)    GFR calc Af Amer 58 (*)    All other components within normal limits  CBC - Abnormal; Notable for the following:    RBC 3.63 (*)    MCV 100.3 (*)    MCH 34.7 (*)    All other components within normal limits  URINALYSIS, ROUTINE W REFLEX MICROSCOPIC (NOT AT [facility])  CBG MONITORING, ED    EKG  EKG Interpretation  Date/Time:  Thursday August 04 2016 18:12:42 EDT Ventricular Rate:  71 PR Interval:  194 QRS Duration: 68 QT Interval:  380 QTC Calculation: 412 R Axis:   38 Text Interpretation:  Normal sinus rhythm Normal ECG agree Confirmed by Johnney Killian, MD, Jeannie Done [phone]) on 08/04/2016 6:21:55 PM       Radiology No results found.  Procedures Procedures (including critical care time)  Medications Ordered in ED Medications - No data to display   Initial Impression / Assessment and Plan / ED Course  I have reviewed the triage vital signs and the nursing notes.  Pertinent labs & imaging results that were available during my care of the patient were reviewed by me and considered in my medical decision making (see chart for details).  Clinical Course    Patient presents to ED with concern for possible pre-syncope. Patient is afebrile and non-toxic appearing in NAD. VSS. Physical exam is re-assuring. Normal neurologic exam. No tongue biting noted. Patient is ambulatory with steady gait. Mild bump in creatinine, unsure etiology. CBC re-assuring. U/A  negative for UTI. EKG shows NSR. Suspect absence seizure vs. Near syncope. Patient h/o absence seizures and endorses staring episode today very reminiscent of her seizures. Unsure cause of absence seizure as patient endorses medication compliance - ?possible unknown trigger. H/o seizures, no head trauma, no AMS - do not feel imaging of head warranted at this time. Discussed results and plan with patient. Encouraged close follow up with neurology and PCP. Continue current medication regimen. Return precautions given. Pt voiced understanding  and is agreeable.    Final Clinical Impressions(s) / ED Diagnoses   Final diagnoses:  Seizure [facility])    New Prescriptions Discharge Medication List as of 08/04/2016  8:53 PM       Roxanna Mew, PA-C 08/06/16 [id number]    Charlesetta Shanks, MD 08/06/16 1349

## 2016-08-05 ENCOUNTER — Telehealth: Payer: Self-pay | Admitting: Neurology

## 2016-08-05 NOTE — Telephone Encounter (Signed)
Returned call and spoke to pt. Appt scheduled for Friday 08/19/16 and pt agreed to arrive @ 11:45 a.m.

## 2016-08-05 NOTE — Telephone Encounter (Signed)
Patient came in and wanted to let Dr. Jannifer Franklin know some updates on her..She was in the hospital yesterday 08/04/16 she said she didn't know what it was.. She said it happened after lunch but she didn't go until lunch later she doesn't know if that had anything to do with it.. They told her in the hospital that she needs to follow up with Dr. Jannifer Franklin the best number to contact patient is [phone]

## 2016-08-09 ENCOUNTER — Encounter: Payer: Self-pay | Admitting: Hematology

## 2016-08-09 ENCOUNTER — Ambulatory Visit (HOSPITAL_BASED_OUTPATIENT_CLINIC_OR_DEPARTMENT_OTHER): Payer: BC Managed Care – PPO | Admitting: Hematology

## 2016-08-09 ENCOUNTER — Other Ambulatory Visit (HOSPITAL_BASED_OUTPATIENT_CLINIC_OR_DEPARTMENT_OTHER): Payer: BC Managed Care – PPO

## 2016-08-09 VITALS — BP 144/69 | HR 65 | Temp 97.7°F | Resp 18 | Ht 62.0 in | Wt 228.7 lb

## 2016-08-09 DIAGNOSIS — R569 Unspecified convulsions: Secondary | ICD-10-CM

## 2016-08-09 DIAGNOSIS — R55 Syncope and collapse: Secondary | ICD-10-CM

## 2016-08-09 DIAGNOSIS — D473 Essential (hemorrhagic) thrombocythemia: Secondary | ICD-10-CM

## 2016-08-09 LAB — CBC WITH DIFFERENTIAL/PLATELET
BASO%: 0.4 % (ref 0.0–2.0)
Basophils Absolute: 0 10*3/uL (ref 0.0–0.1)
EOS%: 4.9 % (ref 0.0–7.0)
Eosinophils Absolute: 0.3 10*3/uL (ref 0.0–0.5)
HEMATOCRIT: 37.9 % (ref 34.8–46.6)
HEMOGLOBIN: 12.8 g/dL (ref 11.6–15.9)
LYMPH#: 1.4 10*3/uL (ref 0.9–3.3)
LYMPH%: 25.5 % (ref 14.0–49.7)
MCH: 34.7 pg — AB (ref 25.1–34.0)
MCHC: 33.8 g/dL (ref 31.5–36.0)
MCV: 102.7 fL — ABNORMAL HIGH (ref 79.5–101.0)
MONO#: 0.4 10*3/uL (ref 0.1–0.9)
MONO%: 7.2 % (ref 0.0–14.0)
NEUT#: 3.4 10*3/uL (ref 1.5–6.5)
NEUT%: 62 % (ref 38.4–76.8)
Platelets: 369 10*3/uL (ref 145–400)
RBC: 3.69 10*6/uL — ABNORMAL LOW (ref 3.70–5.45)
RDW: 12.2 % (ref 11.2–14.5)
WBC: 5.5 10*3/uL (ref 3.9–10.3)

## 2016-08-09 LAB — COMPREHENSIVE METABOLIC PANEL
ALBUMIN: 3.3 g/dL — AB (ref 3.5–5.0)
ALT: 15 U/L (ref 0–55)
AST: 15 U/L (ref 5–34)
Alkaline Phosphatase: 79 U/L (ref 40–150)
Anion Gap: 8 mEq/L (ref 3–11)
BUN: 19.2 mg/dL (ref 7.0–26.0)
CALCIUM: 9.1 mg/dL (ref 8.4–10.4)
CHLORIDE: 105 meq/L (ref 98–109)
CO2: 28 mEq/L (ref 22–29)
CREATININE: 1.1 mg/dL (ref 0.6–1.1)
EGFR: 62 mL/min/{1.73_m2} — ABNORMAL LOW (ref >90)
GLUCOSE: 140 mg/dL (ref 70–140)
POTASSIUM: 3.9 meq/L (ref 3.5–5.1)
SODIUM: 141 meq/L (ref 136–145)
Total Bilirubin: 0.27 mg/dL (ref 0.20–1.20)
Total Protein: 6.8 g/dL (ref 6.4–8.3)

## 2016-08-09 MED ORDER — ANAGRELIDE HCL 1 MG PO CAPS: 1.0000 mg | ORAL_CAPSULE | Freq: Every day | ORAL | 1 refills | Status: DC

## 2016-08-09 NOTE — Progress Notes (Signed)
[place] OFFICE PROGRESS NOTE   [name], [address] [id number]  DIAGNOSIS: Essential thrombocythemia [facility])  CC: follow up Essential Thrombocytosis  CURRENT THERAPY: Hydroxyurea 500 mg daily, started in 2010., increased to hydrea 1000mg  daily on Monday and  Thursday, and 500mg  daily for the rest of week in 01/2015,  Aspirin 81 mg daily.  INTERVAL HISTORY:   Sara [name] 60 y.o. female with history of JAK positive Essential thrombocythemia presents for follow up. She was last seen by me 5 month ago. She is doing well overall. She did have another episode of "seizure-like" activity last week. She has been quite stressed lately with her work and taking care of her son. She was sitting in her chair in her office, and she started having the weird feeling, and started "stareing without body movement or response", she was not able to talk or response, did not lose consciousness but could not remember the process well, and it resolved in a few minutes. Her husband feels she was slightly confused for a minute after she came out of the episode. No focal jerky movement, or stroke kind of symptoms. This is similar to her previous episodes of syncope.    MEDICAL HISTORY: Past Medical History:  Diagnosis Date  . Diabetes mellitus   . Gout   . Hyperlipemia   . Hypertension   . LVH (left ventricular hypertrophy)    12/21/09 Echo -mild asymmetric LVH,EF =>55%  . Morbid obesity ([place])   . OSA (obstructive sleep apnea)   . PAD (peripheral artery disease) ([place])   . Renal artery stenosis ([place]) 12/13/2010   PTCA and Stent - right  . Syncopal episodes   . Thrombocytosis ([facility])      ALLERGIES:  is allergic to sulfa antibiotics.  MEDICATIONS:    Medication List       Accurate as of 08/09/16  3:26 PM. Always use your most recent med list.          aspirin EC 81 MG tablet Take 81 mg by mouth daily.   atorvastatin 10 MG  tablet Commonly known as:  LIPITOR Take 10 mg by mouth daily. Tuesday, Wednesday, Friday, Saturday, and Sunday.   B COMPLEX PO Take 1 tablet by mouth daily.   Doxepin HCl 5 % Crea Apply 1 g topically 3 (three) times daily as needed (Shoulder pain).   hydroxyurea 500 MG capsule Commonly known as:  HYDREA take 2  CAPSULES DAILY ON MONDAYS AND THURSDAYS AND 1 CAPSULE DAILY FOR THE REST OF THE WEEK     **MAY TAKE WITH FOOD TO MINIMIZE GI SIDE EF   lacosamide 50 MG Tabs tablet Commonly known as:  [name] Take 1 tablet (50 mg total) by mouth 2 (two) times daily.   nebivolol 5 MG tablet Commonly known as:  BYSTOLIC Take 1 tablet (5 mg total) by mouth daily.   telmisartan 20 MG tablet Commonly known as:  MICARDIS Take 20 mg by mouth 2 (two) times daily.   Vitamin D3 2000 units capsule Take 2,000 Units by mouth daily.       SURGICAL HISTORY:  Past Surgical History:  Procedure Laterality Date  . BREAST CYST EXCISION     S/P Benign Right Breast Cyst Removal.  . BREAST REDUCTION SURGERY  1995   S/P Bilateral breast reduction  . [place]   Times  Two.  Marland Kitchen EXTREMITY CYST EXCISION  1993   left wrist  .  PARTIAL HYSTERECTOMY  1994  . renal artery stent placement Right     REVIEW OF SYSTEMS:   Constitutional: Denies fevers, chills or abnormal weight loss Eyes: Denies blurriness of vision Ears, nose, mouth, throat, and face: Denies mucositis or sore throat Respiratory: Denies cough, dyspnea or wheezes Cardiovascular: Denies palpitation, chest discomfort or lower extremity swelling Gastrointestinal:  Denies nausea, heartburn ; she has constipation.  Skin: Denies abnormal skin rashes Lymphatics: Denies new lymphadenopathy or easy bruising Neurological:Denies numbness, tingling or new weaknesses Behavioral/Psych: Mood is stable, no new changes  All other systems were reviewed with the patient and are negative.  PHYSICAL EXAMINATION: ECOG PERFORMANCE STATUS:  0  Blood pressure (!) 144/69, pulse 65, temperature 97.7 F (36.5 C), temperature source Oral, resp. rate 18, height 5\' 2"  (1.575 m), weight 228 lb 11.2 oz (103.7 kg), SpO2 100 %.  GENERAL:alert, no distress and comfortable; obese pleasant middle-age woman SKIN: skin color, texture, turgor are normal, no rashes or significant lesions except a large ecchymosis in the left leg EYES: normal, Conjunctiva are pink and non-injected, sclera clear OROPHARYNX:no exudate, no erythema and lips, buccal mucosa, and tongue normal  NECK: supple, thyroid normal size, non-tender, without nodularity LYMPH:  no palpable lymphadenopathy in the cervical, axillary or inguinal LUNGS: clear to auscultation and percussion with normal breathing effort HEART: regular rate & rhythm and no murmurs and no lower extremity edema ABDOMEN:abdomen soft,  Mildly tender in the epigastric and normal bowel sounds Musculoskeletal:no cyanosis of digits and no clubbing  NEURO: alert & oriented x 3 with fluent speech, no focal motor/sensory deficits   LABORATORY DATA:  CBC Latest Ref Rng & Units 08/09/2016 08/04/2016 03/09/2016  WBC 3.9 - 10.3 10e3/uL 5.5 6.4 5.0  Hemoglobin 11.6 - 15.9 g/dL 12.8 12.6 13.3  Hematocrit 34.8 - 46.6 % 37.9 36.4 39.8  Platelets 145 - 400 10e3/uL 369 367 354    CMP Latest Ref Rng & Units 08/09/2016 08/04/2016 03/09/2016  Glucose 70 - 140 mg/dl 140 104(H) 91  BUN 7.0 - 26.0 mg/dL 19.2 20 17.3  Creatinine 0.6 - 1.1 mg/dL 1.1 1.16(H) 1.0  Sodium 136 - 145 mEq/L 141 139 139  Potassium 3.5 - 5.1 mEq/L 3.9 4.0 4.0  Chloride 101 - 111 mmol/L - 106 -  CO2 22 - 29 mEq/L 28 27 28   Calcium 8.4 - 10.4 mg/dL 9.1 8.9 9.0  Total Protein 6.4 - 8.3 g/dL 6.8 - 6.9  Total Bilirubin 0.20 - 1.20 mg/dL 0.27 - 0.32  Alkaline Phos 40 - 150 U/L 79 - 76  AST 5 - 34 U/L 15 - 16  ALT 0 - 55 U/L 15 - 18   Pathology report 01/20/2009 BONE MARROW, RIGHT POSTERIOR ILIAC CREST, BIOPSY AND ASPIRATION: - CELLULAR MARROW  WITH TRILINEAGE HEMATOPOIESIS AND MATURATION. - NO EVIDENCE OF EXCESS BLASTS.   BONE MARROW ASPIRATE: The aspirate material is composed of a few somewhat cellular bone marrow particles displaying a mixture of cell types. Erythroid and granulocytic series show orderly and progressive maturation. Megakaryocytes are abundant with predominantly normal morphology.  BONE MARROW CORE BIOPSY: The bone marrow core biopsy has very prominent aspiration artifact. Sections show 40 to 60% cellularity with a mixture of cell types. The megakaryocytes are abundant with normal morphology. No megakaryocyte clustering is seen. The granulocytic series and erythroid series show orderly and progressive maturation. No excess blasts are seen.  Karyotype: 46,XX[20].nuc ish (ABL,BCR)x2  Interpretation: Cytogenetic Analysis:Normal: Cytogenetic analysis revealed the presence of normal female chromosomes with no observable  clonal chromosomal abnormalities.  Molecular Cytogenetic Analysis FISH: Normal: The investigative technique of molecular cytogenetic analysis with DNA probes specific for the Major/Minor ABL (9q34) and BCR (22q11) genes revealed the lack of significant number of BCR/ABL fusion signals. These results are consistent with a normal finding and a lack of the BCR/ABL fusion associated with CML/ALL.  RADIOGRAPHIC STUDIES:  No new scans   ASSESSMENT: Essential thrombocythemia. Diagnosed in 2010. JAK2 mutation was present. The patient underwent a bone marrow aspirate and biopsy on 01/20/2009 which yielded a specimen that was suboptimal for evaluation. However, megakaryocytes were abundant with normal morphology. There was no clustering or excess blasts seen. Platelet count on 01/20/2009 was 881. The patient is being treated with aspirin and hydroxyurea.  PLAN:  1. Essential Thrombocythemia. JAK2 (+)  --I reviewed her above laboratory results and history extensively. I  explained to her this chronic disease, not curable but treatable. Small percentage patient will develop myelofibrosis or leukemia in late stage. The disease related risk is thrombosis, including stroke and heart attack. -Her platelet count has been well controlled, 354K today. She previous syncope and seizure likely activities are probably not related to hydrea, also I cannot rule out completely -Patient is also concerned about long-term side effects from Hydrea, especially risk of leukemia -We discussed the other alternative treatment options, such as anagrelide. The potential side effects of Hydrea and anagrelide were discussed with her in details. After the discussion, we decided to switch her from Hydrea to anagrelide. -I sent a prescription of anagrelide 1 mg daily to her pharmacy -We'll monitor her CBC every 2 weeks for the next 2 months, we'll titrate her anagrelide dose if needed. -Continue aspirin.  2. Seizure -cont meds, follow up with her neurologist   3. Syncope episode -Unclear etiology. -I suspect the possibility of her syncope is probably not related to Hydrea.  Follow-up.  -Switch Hydrea to anagrelide 1 mg daily -CBC every 2 weeks for the next 2 months -I will see her back in 2 months with lab   All questions were answered. The patient knows to call the clinic with any problems, questions or concerns. We can certainly see the patient much sooner if necessary.  --I spent 25 minutes counseling the patient face to face. The total time spent in the appointment was 25 minutes.    Truitt Merle  08/09/2016

## 2016-08-15 ENCOUNTER — Telehealth: Payer: Self-pay | Admitting: *Deleted

## 2016-08-15 ENCOUNTER — Telehealth: Payer: Self-pay | Admitting: Cardiovascular Disease

## 2016-08-15 NOTE — Telephone Encounter (Signed)
Pt called requesting a call back from nurse.   Spoke with pt and was informed that she has not started on Anagrelide as prescribed by Dr. Burr Medico.  Stated she wanted to make sure to discuss with her cardiologist about drug interactions.  Pt is also taking seizure meds.  Stated she had taken Hydrea dose today.   Instructed pt to STOP  Hydrea.  Start Anagrelide 1 mg daily on Tuesday 08/06/16 as instructed by Dr. Burr Medico.   Gave pt appt date and time for lab on Tues 08/23/16.   Pt to pick up appt calendar next Tuesday when she comes in for lab.  Pt voiced understanding.

## 2016-08-15 NOTE — Telephone Encounter (Signed)
Spoke to patient, communicated advice. She voiced understanding and thanks for follow up.

## 2016-08-15 NOTE — Telephone Encounter (Signed)
With the ASA she will need to monitor for bleeding (watch for blood in stool/urine). Anagrelide should not influence her other cardiac medications.

## 2016-08-15 NOTE — Telephone Encounter (Signed)
Mrs. Bendick is calling to see if she is able to take this blood thinner (Anagrelide) that her Hemologist  has prescribe for you.  She currently takes Bystolic and a aspirin . Please call

## 2016-08-15 NOTE — Telephone Encounter (Signed)
Patient has been prescribed anagrelide by hemologist.  Will need to verify no interactions with her other meds or concern for taking this w a daily aspirin.

## 2016-08-19 ENCOUNTER — Ambulatory Visit (INDEPENDENT_AMBULATORY_CARE_PROVIDER_SITE_OTHER): Payer: BC Managed Care – PPO | Admitting: Neurology

## 2016-08-19 ENCOUNTER — Encounter: Payer: Self-pay | Admitting: Neurology

## 2016-08-19 VITALS — Ht 62.0 in | Wt 226.8 lb

## 2016-08-19 DIAGNOSIS — R569 Unspecified convulsions: Secondary | ICD-10-CM

## 2016-08-19 MED ORDER — LACOSAMIDE 100 MG PO TABS: 100.0000 mg | ORAL_TABLET | Freq: Two times a day (BID) | ORAL | 3 refills | Status: DC

## 2016-08-19 NOTE — Patient Instructions (Signed)
   With the [name] 50 mg tablets, start one in the morning and two in the evening for 1 week, then take 2 twice a day. When you run out of the [name] 50 mg, start the 100 mg tablet one twice a day.

## 2016-08-19 NOTE — Progress Notes (Signed)
Reason for visit: Seizures  Sara [name] is an 60 y.o. female  History of present illness:  Sara [name] is a 61 year old right-handed black female with a history of seizures with partial complex features. The patient has been on [name] and she has tolerated the medication well with good control the seizures until just recently. The patient went to the emergency room on 08/04/2016 following an event that occurred at work. The patient had a sensation that she was going to have a seizure, then had a brief episode of staring, she was not responding to her coworkers. The patient indicates that she had been under a lot of stress at work, she had not slept well the night before. The patient has been operating a motor vehicle. The patient returns to the office today for an evaluation. The patient had not missed any doses of the [name].  Past Medical History:  Diagnosis Date  . Diabetes mellitus   . Gout   . Hyperlipemia   . Hypertension   . LVH (left ventricular hypertrophy)    12/21/09 Echo -mild asymmetric LVH,EF =>55%  . Morbid obesity ([place])   . OSA (obstructive sleep apnea)   . PAD (peripheral artery disease) ([place])   . Renal artery stenosis ([place]) 12/13/2010   PTCA and Stent - right  . Syncopal episodes   . Thrombocytosis ([place])     Past Surgical History:  Procedure Laterality Date  . BREAST CYST EXCISION     S/P Benign Right Breast Cyst Removal.  . BREAST REDUCTION SURGERY  1995   S/P Bilateral breast reduction  . [place]   Times  Two.  Marland Kitchen EXTREMITY CYST EXCISION  1993   left wrist  . PARTIAL HYSTERECTOMY  1994  . renal artery stent placement Right     Family History  Problem Relation Age of Onset  . Heart failure Mother   . Diabetes Father   . Diabetes Sister   . Hypertension Sister   . Hypertension Sister   . Hypertension Sister   . Breast cancer Maternal Aunt     2 maternal aunts had breast cancer.  . Breast cancer Maternal Grandmother      Social history:  reports that she has never smoked. She has never used smokeless tobacco. She reports that she does not drink alcohol or use drugs.    Allergies  Allergen Reactions  . Sulfa Antibiotics Itching    Medications:  Prior to Admission medications   Medication Sig Start Date End Date Taking? Authorizing Provider  anagrelide ([name]) 1 MG capsule Take 1 capsule (1 mg total) by mouth daily. 08/09/16   Truitt Merle, MD  aspirin EC 81 MG tablet Take 81 mg by mouth daily.    Historical Provider, MD  atorvastatin (LIPITOR) 10 MG tablet Take 10 mg by mouth daily. Tuesday, Wednesday, Friday, Saturday, and Sunday. 06/08/15   Historical Provider, MD  B Complex Vitamins (B COMPLEX PO) Take 1 tablet by mouth daily.     Historical Provider, MD  Cholecalciferol (VITAMIN D3) 2000 UNITS capsule Take 2,000 Units by mouth daily.     Historical Provider, MD  lacosamide ([name]) 50 MG TABS tablet Take 1 tablet (50 mg total) by mouth 2 (two) times daily. 03/09/16   [name], MD  nebivolol (BYSTOLIC) 5 MG tablet Take 1 tablet (5 mg total) by mouth daily. 04/27/16   Mihai Croitoru, MD  telmisartan (MICARDIS) 20 MG tablet Take 20 mg by mouth  2 (two) times daily.  05/19/15   Historical Provider, MD    ROS:  Out of a complete 14 system review of symptoms, the patient complains only of the following symptoms, and all other reviewed systems are negative.  Seizure  Height 5\' 2"  (1.575 m), weight 226 lb 12 oz (102.9 kg).  Physical Exam  General: The patient is alert and cooperative at the time of the examination. The patient is moderately obese.  Skin: No significant peripheral edema is noted.   Neurologic Exam  Mental status: The patient is alert and oriented x 3 at the time of the examination. The patient has apparent normal recent and remote memory, with an apparently normal attention span and concentration ability.   Cranial nerves: Facial symmetry is present. Speech is normal, no  aphasia or dysarthria is noted. Extraocular movements are full. Visual fields are full.  Motor: The patient has good strength in all 4 extremities.  Sensory examination: Soft touch sensation is symmetric on the face, arms, and legs.  Coordination: The patient has good finger-nose-finger and heel-to-shin bilaterally.  Gait and station: The patient has a normal gait. Tandem gait is normal. Romberg is negative. No drift is seen.  Reflexes: Deep tendon reflexes are symmetric.   Assessment/Plan:  1. History of seizures with recent recurrence  The patient had sleep deprivation, this likely resulted in a breakthrough seizure. The patient will go up on her [name] taking 100 mg twice daily. The patient is not to drive for 6 months following the last seizure. She will follow-up in 6 months for an evaluation. She will call if she has tolerance issues with the elevated dose of [name].  Jill Alexanders MD 08/19/2016 12:22 PM  [facility] [address] [place], [place] [address]  Phone [phone] Fax [phone]

## 2016-08-22 ENCOUNTER — Telehealth: Payer: Self-pay | Admitting: Hematology

## 2016-08-22 NOTE — Telephone Encounter (Signed)
Spoke with patient and confirmed 10/31 lab. Patient to get new schedule 10/31.

## 2016-08-23 ENCOUNTER — Other Ambulatory Visit (HOSPITAL_BASED_OUTPATIENT_CLINIC_OR_DEPARTMENT_OTHER): Payer: BC Managed Care – PPO

## 2016-08-23 DIAGNOSIS — D473 Essential (hemorrhagic) thrombocythemia: Secondary | ICD-10-CM | POA: Diagnosis not present

## 2016-08-23 LAB — CBC WITH DIFFERENTIAL/PLATELET
BASO%: 0.6 % (ref 0.0–2.0)
Basophils Absolute: 0 10*3/uL (ref 0.0–0.1)
EOS%: 7 % (ref 0.0–7.0)
Eosinophils Absolute: 0.4 10*3/uL (ref 0.0–0.5)
HEMATOCRIT: 40.3 % (ref 34.8–46.6)
HGB: 13.2 g/dL (ref 11.6–15.9)
LYMPH#: 1.5 10*3/uL (ref 0.9–3.3)
LYMPH%: 27.3 % (ref 14.0–49.7)
MCH: 34 pg (ref 25.1–34.0)
MCHC: 32.7 g/dL (ref 31.5–36.0)
MCV: 103.8 fL — ABNORMAL HIGH (ref 79.5–101.0)
MONO#: 0.5 10*3/uL (ref 0.1–0.9)
MONO%: 8.4 % (ref 0.0–14.0)
NEUT#: 3.2 10*3/uL (ref 1.5–6.5)
NEUT%: 56.7 % (ref 38.4–76.8)
Platelets: 252 10*3/uL (ref 145–400)
RBC: 3.88 10*6/uL (ref 3.70–5.45)
RDW: 11.8 % (ref 11.2–14.5)
WBC: 5.6 10*3/uL (ref 3.9–10.3)

## 2016-08-23 LAB — COMPREHENSIVE METABOLIC PANEL
ALT: 24 U/L (ref 0–55)
AST: 19 U/L (ref 5–34)
Albumin: 3.4 g/dL — ABNORMAL LOW (ref 3.5–5.0)
Alkaline Phosphatase: 88 U/L (ref 40–150)
Anion Gap: 9 mEq/L (ref 3–11)
BUN: 18.1 mg/dL (ref 7.0–26.0)
CHLORIDE: 105 meq/L (ref 98–109)
CO2: 28 meq/L (ref 22–29)
CREATININE: 1.1 mg/dL (ref 0.6–1.1)
Calcium: 9.3 mg/dL (ref 8.4–10.4)
EGFR: 62 mL/min/{1.73_m2} — ABNORMAL LOW (ref >90)
GLUCOSE: 135 mg/dL (ref 70–140)
Potassium: 4 mEq/L (ref 3.5–5.1)
Sodium: 143 mEq/L (ref 136–145)
Total Bilirubin: 0.4 mg/dL (ref 0.20–1.20)
Total Protein: 7.2 g/dL (ref 6.4–8.3)

## 2016-08-31 ENCOUNTER — Other Ambulatory Visit: Payer: BC Managed Care – PPO

## 2016-09-06 ENCOUNTER — Other Ambulatory Visit (HOSPITAL_BASED_OUTPATIENT_CLINIC_OR_DEPARTMENT_OTHER): Payer: BC Managed Care – PPO

## 2016-09-06 DIAGNOSIS — D473 Essential (hemorrhagic) thrombocythemia: Secondary | ICD-10-CM

## 2016-09-06 LAB — COMPREHENSIVE METABOLIC PANEL
ALT: 21 U/L (ref 0–55)
AST: 17 U/L (ref 5–34)
Albumin: 3.2 g/dL — ABNORMAL LOW (ref 3.5–5.0)
Alkaline Phosphatase: 85 U/L (ref 40–150)
Anion Gap: 8 mEq/L (ref 3–11)
BILIRUBIN TOTAL: 0.41 mg/dL (ref 0.20–1.20)
BUN: 19.3 mg/dL (ref 7.0–26.0)
CHLORIDE: 105 meq/L (ref 98–109)
CO2: 29 meq/L (ref 22–29)
CREATININE: 1.2 mg/dL — AB (ref 0.6–1.1)
Calcium: 9.3 mg/dL (ref 8.4–10.4)
EGFR: 54 mL/min/{1.73_m2} — ABNORMAL LOW (ref >90)
GLUCOSE: 134 mg/dL (ref 70–140)
Potassium: 4.1 mEq/L (ref 3.5–5.1)
SODIUM: 142 meq/L (ref 136–145)
TOTAL PROTEIN: 6.7 g/dL (ref 6.4–8.3)

## 2016-09-06 LAB — CBC WITH DIFFERENTIAL/PLATELET
BASO%: 0.6 % (ref 0.0–2.0)
Basophils Absolute: 0 10*3/uL (ref 0.0–0.1)
EOS%: 3.7 % (ref 0.0–7.0)
Eosinophils Absolute: 0.3 10*3/uL (ref 0.0–0.5)
HCT: 39.2 % (ref 34.8–46.6)
HGB: 13 g/dL (ref 11.6–15.9)
LYMPH%: 26.9 % (ref 14.0–49.7)
MCH: 33.7 pg (ref 25.1–34.0)
MCHC: 33.2 g/dL (ref 31.5–36.0)
MCV: 101.6 fL — ABNORMAL HIGH (ref 79.5–101.0)
MONO#: 0.5 10*3/uL (ref 0.1–0.9)
MONO%: 7.2 % (ref 0.0–14.0)
NEUT%: 61.6 % (ref 38.4–76.8)
NEUTROS ABS: 4.5 10*3/uL (ref 1.5–6.5)
Platelets: 267 10*3/uL (ref 145–400)
RBC: 3.86 10*6/uL (ref 3.70–5.45)
RDW: 11.3 % (ref 11.2–14.5)
WBC: 7.3 10*3/uL (ref 3.9–10.3)
lymph#: 2 10*3/uL (ref 0.9–3.3)

## 2016-09-20 ENCOUNTER — Other Ambulatory Visit (HOSPITAL_BASED_OUTPATIENT_CLINIC_OR_DEPARTMENT_OTHER): Payer: BC Managed Care – PPO

## 2016-09-20 DIAGNOSIS — D473 Essential (hemorrhagic) thrombocythemia: Secondary | ICD-10-CM | POA: Diagnosis not present

## 2016-09-20 LAB — COMPREHENSIVE METABOLIC PANEL
ALT: 18 U/L (ref 0–55)
ANION GAP: 9 meq/L (ref 3–11)
AST: 16 U/L (ref 5–34)
Albumin: 3.2 g/dL — ABNORMAL LOW (ref 3.5–5.0)
Alkaline Phosphatase: 79 U/L (ref 40–150)
BUN: 21.9 mg/dL (ref 7.0–26.0)
CHLORIDE: 106 meq/L (ref 98–109)
CO2: 27 meq/L (ref 22–29)
CREATININE: 1.1 mg/dL (ref 0.6–1.1)
Calcium: 9.1 mg/dL (ref 8.4–10.4)
EGFR: 61 mL/min/{1.73_m2} — ABNORMAL LOW (ref >90)
Glucose: 157 mg/dl — ABNORMAL HIGH (ref 70–140)
POTASSIUM: 3.9 meq/L (ref 3.5–5.1)
Sodium: 142 mEq/L (ref 136–145)
Total Bilirubin: 0.35 mg/dL (ref 0.20–1.20)
Total Protein: 6.8 g/dL (ref 6.4–8.3)

## 2016-09-20 LAB — CBC WITH DIFFERENTIAL/PLATELET
BASO%: 0.4 % (ref 0.0–2.0)
BASOS ABS: 0 10*3/uL (ref 0.0–0.1)
EOS%: 4 % (ref 0.0–7.0)
Eosinophils Absolute: 0.3 10*3/uL (ref 0.0–0.5)
HCT: 39.7 % (ref 34.8–46.6)
HGB: 13.2 g/dL (ref 11.6–15.9)
LYMPH%: 29.1 % (ref 14.0–49.7)
MCH: 33 pg (ref 25.1–34.0)
MCHC: 33.2 g/dL (ref 31.5–36.0)
MCV: 99.3 fL (ref 79.5–101.0)
MONO#: 0.4 10*3/uL (ref 0.1–0.9)
MONO%: 6.2 % (ref 0.0–14.0)
NEUT#: 4.2 10*3/uL (ref 1.5–6.5)
NEUT%: 60.3 % (ref 38.4–76.8)
PLATELETS: 253 10*3/uL (ref 145–400)
RBC: 4 10*6/uL (ref 3.70–5.45)
RDW: 11.1 % — ABNORMAL LOW (ref 11.2–14.5)
WBC: 6.9 10*3/uL (ref 3.9–10.3)
lymph#: 2 10*3/uL (ref 0.9–3.3)

## 2016-10-03 NOTE — Progress Notes (Signed)
[facility] OFFICE PROGRESS NOTE   [name], [place] [address]  DIAGNOSIS: Essential thrombocythemia [facility]) - Plan: CBC with Differential, Comprehensive metabolic panel  CC: follow up Essential Thrombocytosis  CURRENT THERAPY: Hydroxyurea 500 mg daily, started in 2010., increased to hydrea 1000mg  daily on Monday and Thursday, and 500mg  daily for the rest of week in 01/2015,  Aspirin 81 mg daily.  INTERVAL HISTORY:   Sara [name] 60 y.o. female with history of JAK positive Essential thrombocythemia presents for follow up. The patient reports seeing the neurologist in [place] and reports having a dizzy spell then. She attributes it to not eating properly/late.  MEDICAL HISTORY: Past Medical History:  Diagnosis Date  . Diabetes mellitus   . Gout   . Hyperlipemia   . Hypertension   . LVH (left ventricular hypertrophy)    12/21/09 Echo -mild asymmetric LVH,EF =>55%  . Sara obesity ([place])   . OSA (obstructive sleep apnea)   . PAD (peripheral artery disease) ([place])   . Renal artery stenosis ([place]) 12/13/2010   PTCA and Stent - right  . Syncopal episodes   . Thrombocytosis ([facility])      ALLERGIES:  is allergic to sulfa antibiotics.  MEDICATIONS:    Medication List       Accurate as of 10/04/16  5:47 PM. Always use your most recent med list.          anagrelide 1 MG capsule Commonly known as:  [name] Take 1 capsule (1 mg total) by mouth daily.   aspirin EC 81 MG tablet Take 81 mg by mouth daily.   atorvastatin 10 MG tablet Commonly known as:  LIPITOR Take 10 mg by mouth daily. Tuesday, Wednesday, Friday, Saturday, and Sunday.   B COMPLEX PO Take 1 tablet by mouth daily.   Lacosamide 100 MG Tabs Take 1 tablet (100 mg total) by mouth 2 (two) times daily.   nebivolol 5 MG tablet Commonly known as:  BYSTOLIC Take 1 tablet (5 mg total) by mouth daily.   telmisartan 20 MG tablet Commonly known  as:  MICARDIS Take 20 mg by mouth 2 (two) times daily.   Vitamin D3 2000 units capsule Take 2,000 Units by mouth daily.       SURGICAL HISTORY:  Past Surgical History:  Procedure Laterality Date  . BREAST CYST EXCISION     S/P Benign Right Breast Cyst Removal.  . BREAST REDUCTION SURGERY  1995   S/P Bilateral breast reduction  . [place]   Times  Two.  Marland Kitchen EXTREMITY CYST EXCISION  1993   left wrist  . PARTIAL HYSTERECTOMY  1994  . renal artery stent placement Right     REVIEW OF SYSTEMS: Constitutional: Denies fevers, chills or abnormal weight loss Eyes: Denies blurriness of vision Ears, nose, mouth, throat, and face: Denies mucositis or sore throat Respiratory: Denies cough, dyspnea or wheezes Cardiovascular: Denies palpitation, chest discomfort or lower extremity swelling Gastrointestinal:  Denies nausea, heartburn. Skin: Denies abnormal skin rashes Lymphatics: Denies new lymphadenopathy or easy bruising Neurological:Denies numbness, tingling or new weaknesses. (+) Intermittent syncope Behavioral/Psych: Mood is stable, no new changes  All other systems were reviewed with the patient and are negative.  PHYSICAL EXAMINATION: ECOG PERFORMANCE STATUS: 0  Blood pressure (!) 169/70, pulse 70, temperature 98 F (36.7 C), temperature source Oral, resp. rate 18, height 5\' 2"  (1.575 m), weight 227 lb 8 oz (103.2 kg), SpO2 97 %.  GENERAL:alert, no distress and comfortable; obese pleasant middle-age woman SKIN: skin color, texture, turgor are normal, no rashes or significant lesions EYES: normal, Conjunctiva are pink and non-injected, sclera clear OROPHARYNX:no exudate, no erythema and lips, buccal mucosa, and tongue normal  NECK: supple, thyroid normal size, non-tender, without nodularity LYMPH:  no palpable lymphadenopathy in the cervical, axillary or inguinal LUNGS: clear to auscultation and percussion with normal breathing effort HEART: regular rate &  rhythm and no murmurs and no lower extremity edema ABDOMEN:abdomen soft, non-tender,normal bowel sounds Musculoskeletal:no cyanosis of digits and no clubbing  NEURO: alert & oriented x 3 with fluent speech, no focal motor/sensory deficits   LABORATORY DATA:  CBC Latest Ref Rng & Units 10/04/2016 09/20/2016 09/06/2016  WBC 3.9 - 10.3 10e3/uL 7.7 6.9 7.3  Hemoglobin 11.6 - 15.9 g/dL 14.2 13.2 13.0  Hematocrit 34.8 - 46.6 % 44.2 39.7 39.2  Platelets 145 - 400 10e3/uL 352 253 267    CMP Latest Ref Rng & Units 10/04/2016 09/20/2016 09/06/2016  Glucose 70 - 140 mg/dl 123 157(H) 134  BUN 7.0 - 26.0 mg/dL 17.5 21.9 19.3  Creatinine 0.6 - 1.1 mg/dL 1.1 1.1 1.2(H)  Sodium 136 - 145 mEq/L 143 142 142  Potassium 3.5 - 5.1 mEq/L 3.8 3.9 4.1  Chloride 101 - 111 mmol/L - - -  CO2 22 - 29 mEq/L 28 27 29   Calcium 8.4 - 10.4 mg/dL 9.4 9.1 9.3  Total Protein 6.4 - 8.3 g/dL 7.3 6.8 6.7  Total Bilirubin 0.20 - 1.20 mg/dL 0.47 0.35 0.41  Alkaline Phos 40 - 150 U/L 86 79 85  AST 5 - 34 U/L 20 16 17   ALT 0 - 55 U/L 21 18 21    Pathology report 01/20/2009 BONE MARROW, RIGHT POSTERIOR ILIAC CREST, BIOPSY AND ASPIRATION: - CELLULAR MARROW WITH TRILINEAGE HEMATOPOIESIS AND MATURATION. - NO EVIDENCE OF EXCESS BLASTS.   BONE MARROW ASPIRATE: The aspirate material is composed of a few somewhat cellular bone marrow particles displaying a mixture of cell types. Erythroid and granulocytic series show orderly and progressive maturation. Megakaryocytes are abundant with predominantly normal morphology.  BONE MARROW CORE BIOPSY: The bone marrow core biopsy has very prominent aspiration artifact. Sections show 40 to 60% cellularity with a mixture of cell types. The megakaryocytes are abundant with normal morphology. No megakaryocyte clustering is seen. The granulocytic series and erythroid series show orderly and progressive maturation. No excess blasts are seen.  Karyotype: 46,XX[20].nuc ish  (ABL,BCR)x2  Interpretation: Cytogenetic Analysis:Normal: Cytogenetic analysis revealed the presence of normal female chromosomes with no observable clonal chromosomal abnormalities.  Molecular Cytogenetic Analysis FISH: Normal: The investigative technique of molecular cytogenetic analysis with DNA probes specific for the Major/Minor ABL (9q34) and BCR (22q11) genes revealed the lack of significant number of BCR/ABL fusion signals. These results are consistent with a normal finding and a lack of the BCR/ABL fusion associated with CML/ALL.  RADIOGRAPHIC STUDIES:  No new scans   ASSESSMENT: Essential thrombocythemia. Diagnosed in 2010. JAK2 mutation was present. The patient underwent a bone marrow aspirate and biopsy on 01/20/2009 which yielded a specimen that was suboptimal for evaluation. However, megakaryocytes were abundant with normal morphology. There was no clustering or excess blasts seen. Platelet count on 01/20/2009 was 881. The patient is being treated with aspirin and Anagrelide.  PLAN:  1. Essential Thrombocythemia. JAK2 (+)  --I previously reviewed her above laboratory results and history extensively. I explained to her this chronic disease, not curable but treatable. Small percentage patient will develop myelofibrosis  or leukemia in late stage. The disease related risk is thrombosis, including stroke and heart attack. -Her platelet count has been well controlled, 352 today.  -Her previous syncope and seizure likely activities are probably not related to hydrea, also I cannot rule out completely. -The patient was concerned about long-term side effects from Hydrea, especially risk of leukemia. -I have switched her Hydrea to anagrelide, she is tolerating very well, her platelet counts has been normal. Will continue anagrelide -We'll monitor her CBC in 2 months, we'll titrate her anagrelide dose if needed. -Continue aspirin.  2. Seizure -cont meds, follow up with her  neurologist.  3. Syncope episode -Unclear etiology. -I suspect the possibility of her syncope is probably not related to medications.  Follow-up.  -Continue anagrelide 1 mg daily with Aspirin 81mg . I refilled the Anagrelide. -The patient will come back in 2 months for CBC, and follow up in 4 months with lab.  All questions were answered. The patient knows to call the clinic with any problems, questions or concerns. We can certainly see the patient much sooner if necessary.  --I spent 20 minutes counseling the patient face to face. The total time spent in the appointment was 15 minutes.    Truitt Merle  10/04/2016   This document serves as a record of services personally performed by Truitt Merle, MD. It was created on her behalf by Darcus Austin, a trained medical scribe. The creation of this record is based on the scribe's personal observations and the provider's statements to them. This document has been checked and approved by the attending provider.

## 2016-10-04 ENCOUNTER — Encounter: Payer: Self-pay | Admitting: Hematology

## 2016-10-04 ENCOUNTER — Telehealth: Payer: Self-pay | Admitting: Hematology

## 2016-10-04 ENCOUNTER — Other Ambulatory Visit (HOSPITAL_BASED_OUTPATIENT_CLINIC_OR_DEPARTMENT_OTHER): Payer: BC Managed Care – PPO

## 2016-10-04 ENCOUNTER — Ambulatory Visit (HOSPITAL_BASED_OUTPATIENT_CLINIC_OR_DEPARTMENT_OTHER): Payer: BC Managed Care – PPO | Admitting: Hematology

## 2016-10-04 VITALS — BP 169/70 | HR 70 | Temp 98.0°F | Resp 18 | Ht 62.0 in | Wt 227.5 lb

## 2016-10-04 DIAGNOSIS — D473 Essential (hemorrhagic) thrombocythemia: Secondary | ICD-10-CM | POA: Diagnosis not present

## 2016-10-04 DIAGNOSIS — R55 Syncope and collapse: Secondary | ICD-10-CM

## 2016-10-04 DIAGNOSIS — R569 Unspecified convulsions: Secondary | ICD-10-CM | POA: Diagnosis not present

## 2016-10-04 LAB — COMPREHENSIVE METABOLIC PANEL
ALBUMIN: 3.4 g/dL — AB (ref 3.5–5.0)
ALK PHOS: 86 U/L (ref 40–150)
ALT: 21 U/L (ref 0–55)
AST: 20 U/L (ref 5–34)
Anion Gap: 9 mEq/L (ref 3–11)
BUN: 17.5 mg/dL (ref 7.0–26.0)
CHLORIDE: 105 meq/L (ref 98–109)
CO2: 28 mEq/L (ref 22–29)
Calcium: 9.4 mg/dL (ref 8.4–10.4)
Creatinine: 1.1 mg/dL (ref 0.6–1.1)
EGFR: 60 mL/min/{1.73_m2} — AB (ref >90)
GLUCOSE: 123 mg/dL (ref 70–140)
POTASSIUM: 3.8 meq/L (ref 3.5–5.1)
SODIUM: 143 meq/L (ref 136–145)
Total Bilirubin: 0.47 mg/dL (ref 0.20–1.20)
Total Protein: 7.3 g/dL (ref 6.4–8.3)

## 2016-10-04 LAB — CBC WITH DIFFERENTIAL/PLATELET
BASO%: 0.9 % (ref 0.0–2.0)
BASOS ABS: 0.1 10*3/uL (ref 0.0–0.1)
EOS ABS: 0.3 10*3/uL (ref 0.0–0.5)
EOS%: 4 % (ref 0.0–7.0)
HCT: 44.2 % (ref 34.8–46.6)
HEMOGLOBIN: 14.2 g/dL (ref 11.6–15.9)
LYMPH%: 24.1 % (ref 14.0–49.7)
MCH: 31.9 pg (ref 25.1–34.0)
MCHC: 32.2 g/dL (ref 31.5–36.0)
MCV: 98.8 fL (ref 79.5–101.0)
MONO#: 0.4 10*3/uL (ref 0.1–0.9)
MONO%: 5.5 % (ref 0.0–14.0)
NEUT#: 5 10*3/uL (ref 1.5–6.5)
NEUT%: 65.5 % (ref 38.4–76.8)
Platelets: 352 10*3/uL (ref 145–400)
RBC: 4.47 10*6/uL (ref 3.70–5.45)
RDW: 11.8 % (ref 11.2–14.5)
WBC: 7.7 10*3/uL (ref 3.9–10.3)
lymph#: 1.9 10*3/uL (ref 0.9–3.3)

## 2016-10-04 MED ORDER — ANAGRELIDE HCL 1 MG PO CAPS: 1.0000 mg | ORAL_CAPSULE | Freq: Every day | ORAL | 5 refills | Status: DC

## 2016-10-04 NOTE — Telephone Encounter (Signed)
Gave patient avs report and appointments for February and April  °

## 2016-10-06 ENCOUNTER — Telehealth: Payer: Self-pay | Admitting: Neurology

## 2016-10-06 ENCOUNTER — Telehealth: Payer: Self-pay | Admitting: Cardiovascular Disease

## 2016-10-06 NOTE — Telephone Encounter (Signed)
New message  Pt needs refill/new Rx  1.atrvastatin 10mg  2. [facility]  3.30 day

## 2016-10-06 NOTE — Telephone Encounter (Signed)
Pt was last seen in Oct after a seizure episode that occurred while at her job. Stated that she had not eaten that day and did not sleep well the night before d/t work-related stress. Her [name] dosage was increased and she was instructed to start one in the morning and two in the evening for 1 week, then take 2 twice a day. When you run out of the [name] 50 mg, start the 100 mg tablet one twice a day. It appears that she never increased dose of her seizure medication and she's continued taking 50 mg twice a day. No reported seizures on current dose.

## 2016-10-06 NOTE — Telephone Encounter (Signed)
Pt said she picked up RX for vimpat and it was increased to 100mg . She advised she took 1/2 tab this morning but she is a little sleepy. She would like to stay on 50mg .. Please call before 4pm

## 2016-10-06 NOTE — Telephone Encounter (Signed)
I called patient. I indicated that I would try to get up to the 100 mg twice daily dose. The patient may be a little drowsy at first, but I think that she will become acclimated to the medication.  The patient had a seizure on the 50 mg twice daily dose, even know she had not eaten and was under some stress, this indicates that it does not take much to have her induce a seizure at that lower dose.

## 2016-10-07 ENCOUNTER — Telehealth: Payer: Self-pay | Admitting: Cardiovascular Disease

## 2016-10-07 MED ORDER — ATORVASTATIN CALCIUM 10 MG PO TABS: 10.0000 mg | ORAL_TABLET | Freq: Every day | ORAL | 0 refills | Status: DC

## 2016-10-07 NOTE — Telephone Encounter (Signed)
Refill sent to the pharmacy electronically.  Left message for patient, she will need to call and schedule f/u appt, has not been seen since 10/16. Limited supply sent to the pharmacy.

## 2016-10-07 NOTE — Telephone Encounter (Signed)
New message       *STAT* If patient is at the pharmacy, call can be transferred to refill team.   1. Which medications need to be refilled? (please list name of each medication and dose if known) atorvastatin 10mg  2. Which pharmacy/location (including street and city if local pharmacy) is medication to be sent to? [facility]  3. Do they need a 30 day or 90 day supply? 30 day supply.  Pt has an appt on 11-04-15.  She is out of medication

## 2016-10-13 ENCOUNTER — Other Ambulatory Visit: Payer: Self-pay | Admitting: *Deleted

## 2016-10-13 NOTE — Telephone Encounter (Signed)
Received list from pharmacies of pt's who are on Bystolic. Per the pharmacy if the pt has been getting a 30 dys of Bystolic they can get the 90 dys of Bystolicf or the same cost. I was going to send in an Rx Bystolic 90 dys though I see pt was over due for appt. Pt is scheduled 11/03/16 with Dr. Sallyanne Kuster, which at that time the 57 dys can be sent in.

## 2016-10-26 ENCOUNTER — Other Ambulatory Visit: Payer: Self-pay | Admitting: *Deleted

## 2016-10-26 MED ORDER — ATORVASTATIN CALCIUM 10 MG PO TABS: 10.0000 mg | ORAL_TABLET | Freq: Every day | ORAL | 0 refills | Status: DC

## 2016-11-02 ENCOUNTER — Ambulatory Visit (INDEPENDENT_AMBULATORY_CARE_PROVIDER_SITE_OTHER): Payer: BC Managed Care – PPO | Admitting: Cardiovascular Disease

## 2016-11-02 ENCOUNTER — Encounter: Payer: Self-pay | Admitting: Cardiovascular Disease

## 2016-11-02 VITALS — BP 140/78 | HR 88 | Ht 62.0 in | Wt 227.0 lb

## 2016-11-02 DIAGNOSIS — I10 Essential (primary) hypertension: Secondary | ICD-10-CM | POA: Diagnosis not present

## 2016-11-02 DIAGNOSIS — G4733 Obstructive sleep apnea (adult) (pediatric): Secondary | ICD-10-CM | POA: Diagnosis not present

## 2016-11-02 DIAGNOSIS — E7800 Pure hypercholesterolemia, unspecified: Secondary | ICD-10-CM | POA: Diagnosis not present

## 2016-11-02 DIAGNOSIS — I5032 Chronic diastolic (congestive) heart failure: Secondary | ICD-10-CM

## 2016-11-02 DIAGNOSIS — IMO0002 Reserved for concepts with insufficient information to code with codable children: Secondary | ICD-10-CM

## 2016-11-02 DIAGNOSIS — Z9889 Other specified postprocedural states: Secondary | ICD-10-CM

## 2016-11-02 MED ORDER — CHLORTHALIDONE 25 MG PO TABS: 12.5000 mg | ORAL_TABLET | Freq: Every day | ORAL | 3 refills | Status: DC

## 2016-11-02 MED ORDER — ATORVASTATIN CALCIUM 10 MG PO TABS: 10.0000 mg | ORAL_TABLET | Freq: Every day | ORAL | 3 refills | Status: DC

## 2016-11-02 NOTE — Patient Instructions (Addendum)
Dr Sallyanne Kuster has recommended making the following medication changes: 1. WEAN OFF Bystolic - take a 0.5 tablet daily for 4 days then STOP 2. START Chlorthalidone - take 0.5 tablet (12.5 mg total) by mouth daily  Your physician has requested that you regularly monitor and record your blood pressure readings at home. Please use the same machine at the same time of day to check your readings and record them to bring to your follow-up visit. In TWO WEEKS, please report your blood pressure to [place] by either calling to speak with a nurse, via mychart, or by mailing your blood pressure record.  Dr Sallyanne Kuster recommends that you schedule a follow-up appointment in 1 year. You will receive a reminder letter in the mail two months in advance. If you don't receive a letter, please call our office to schedule the follow-up appointment.  If you need a refill on your cardiac medications before your next appointment, please call your pharmacy.

## 2016-11-02 NOTE — Progress Notes (Signed)
Cardiology Office Note    Date:  11/02/2016   ID:  Sara [name], DOB Jun 03, 1956, MRN [id number]  PCP:  [name], MD  Cardiologist:   Sanda Klein, MD   Chief Complaint  Patient presents with  . Follow-up    History of Present Illness:  Sara [name] is a 61 y.o. female with history of peripheral arterial disease (renal artery stenosis with stent 2012), morbid obesity, diabetes mellitus, hyperlipidemia, hypertension, obstructive sleep apnea on CPAP essential thrombocythemia and absence seizures, turning for routine follow-up  She has generally done quite well since her last appointment without any adverse cardiac events. She has been switched from hydroxyurea to anagrelide and roughly since that time has developed some mild pedal edema. He is compliant with CPAP. She had a very favorable lipid profile in August 2017 and her hemoglobin A1c was excellent at 5.9%. Unfortunately, she has not made any progress with weight loss, her BMI is still 41.  The patient specifically denies any chest pain at rest exertion, dyspnea at rest or with exertion, orthopnea, paroxysmal nocturnal dyspnea, syncope, palpitations, focal neurological deficits, intermittent claudication, unexplained weight gain, cough, hemoptysis or wheezing. He complains of fatigue towards the middle of the day and attributes it to her medications. Unclear whether it is her new antiepileptic or the beta blocker. Palpitations have not bothered her in a long time.   Past Medical History:  Diagnosis Date  . Diabetes mellitus   . Gout   . Hyperlipemia   . Hypertension   . LVH (left ventricular hypertrophy)    12/21/09 Echo -mild asymmetric LVH,EF =>55%  . Morbid obesity ([place])   . OSA (obstructive sleep apnea)   . PAD (peripheral artery disease) ([place])   . Renal artery stenosis ([place]) 12/13/2010   PTCA and Stent - right  . Syncopal episodes   . Thrombocytosis ([place])     Past Surgical History:  Procedure  Laterality Date  . BREAST CYST EXCISION     S/P Benign Right Breast Cyst Removal.  . BREAST REDUCTION SURGERY  1995   S/P Bilateral breast reduction  . [place]   Times  Two.  Marland Kitchen EXTREMITY CYST EXCISION  1993   left wrist  . PARTIAL HYSTERECTOMY  1994  . renal artery stent placement Right     Current Medications: Outpatient Medications Prior to Visit  Medication Sig Dispense Refill  . anagrelide ([name]) 1 MG capsule Take 1 capsule (1 mg total) by mouth daily. 30 capsule 5  . aspirin EC 81 MG tablet Take 81 mg by mouth daily.    Marland Kitchen atorvastatin (LIPITOR) 10 MG tablet Take 1 tablet (10 mg total) by mouth daily. Tuesday, Wednesday, Friday, Saturday, and Sunday. 15 tablet 0  . B Complex Vitamins (B COMPLEX PO) Take 1 tablet by mouth daily.     . Cholecalciferol (VITAMIN D3) 2000 UNITS capsule Take 2,000 Units by mouth daily.     . Lacosamide 100 MG TABS Take 1 tablet (100 mg total) by mouth 2 (two) times daily. 180 tablet 3  . nebivolol (BYSTOLIC) 5 MG tablet Take 1 tablet (5 mg total) by mouth daily. 28 tablet 0  . telmisartan (MICARDIS) 20 MG tablet Take 20 mg by mouth 2 (two) times daily.   0   No facility-administered medications prior to visit.      Allergies:   Sulfa antibiotics   Social History   Social History  . Marital status: Married  Spouse name: Juanda Crumble  . Number of children: 2  . Years of education: 16   Occupational History  . [employer]. [place] History Main Topics  . Smoking status: Never Smoker  . Smokeless tobacco: Never Used  . Alcohol use No  . Drug use: No  . Sexual activity: Yes   Other Topics Concern  . None   Social History Narrative   Patient lives at husband and her son, home with family.   Patient has two adult children.   Patient is not drinking any caffeine.   Patient is working full-time.   Patient has a college education.   Patient is right-handed.     Family History:  The  patient's family history includes Breast cancer in her maternal aunt and maternal grandmother; Diabetes in her father and sister; Heart failure in her mother; Hypertension in her sister, sister, and sister.   ROS:   Please see the history of present illness.    ROS All other systems reviewed and are negative.   PHYSICAL EXAM:   VS:  BP 140/78 (BP Location: Right Arm, Patient Position: Sitting, Cuff Size: Large)   Pulse 88   Ht 5\' 2"  (1.575 m)   Wt 227 lb (103 kg)   BMI 41.52 kg/m    GEN: Well nourished, well developed, in no acute distress  HEENT: normal  Neck: no JVD, carotid bruits, or masses Cardiac: RRR; no murmurs, rubs, or gallops, 1+ symmetrical edema limited to the dorsum of the feet  Respiratory:  clear to auscultation bilaterally, normal work of breathing GI: soft, nontender, nondistended, + BS MS: no deformity or atrophy  Skin: warm and dry, no rash Neuro:  Alert and Oriented x 3, Strength and sensation are intact Psych: euthymic mood, full affect  Wt Readings from Last 3 Encounters:  11/02/16 227 lb (103 kg)  10/04/16 227 lb 8 oz (103.2 kg)  08/19/16 226 lb 12 oz (102.9 kg)      Studies/Labs Reviewed:   EKG:  EKG is not ordered today.  The ekg ordered 08/05/16 demonstrates sinus rhythm and is a normal tracing  Recent Labs: 10/04/2016: ALT 21; BUN 17.5; Creatinine 1.1; HGB 14.2; Platelets 352; Potassium 3.8; Sodium 143   Lipids performed August 2017 total cholesterol 125, HDL 46, LDL 66, triglycerides 67, A1c 5.9% TSH 2.64   ASSESSMENT:    1. Chronic diastolic congestive heart failure ([place])   2. Essential hypertension   3. OSA (obstructive sleep apnea)   4. Pure hypercholesterolemia   5. Morbid obesity ([place])   6. History of renal stent      PLAN:  In order of problems listed above:  1. CHF: She does not have overt findings of heart failure other than the mild pedal edema. Looking through the typical side effects of anagrelide, edema is mentioned in  21% of subjects; will try to switch one of her medications for blood pressure to a light diuretic. 2. HTN: Her complaints of fatigue could well be related to her seizure medication, but may also be related to her beta blocker. Will wean off by systolic and replace it will chlorthalidone. He has not had a gout attack in many many years. Palpitations are currently well controlled. If either palpitations or gout attacks make this plan unsuccessful, would switch to a lower cost beta blocker, probably carvedilol 3. OSA: Congratulated for compliance with CPAP 4. HLP: Excellent lipid profile 5. Obesity: weight loss recommended. 6. History of renal artery  stenosis/stent    Medication Adjustments/Labs and Tests Ordered: Current medicines are reviewed at length with the patient today.  Concerns regarding medicines are outlined above.  Medication changes, Labs and Tests ordered today are listed in the Patient Instructions below. There are no Patient Instructions on file for this visit.   Signed, Sanda Klein, MD  11/02/2016 2:00 PM    [address], [place], [place]  [address] Phone: [phone]; Fax: [phone]

## 2016-11-03 ENCOUNTER — Ambulatory Visit: Payer: BC Managed Care – PPO | Admitting: Cardiovascular Disease

## 2016-11-14 ENCOUNTER — Telehealth: Payer: Self-pay | Admitting: Cardiovascular Disease

## 2016-11-14 NOTE — Telephone Encounter (Signed)
Sara [name] is calling because she states that she was to check her BP for two weeks but didn't realize that she was supposed to check it the same time each day. She would like to know if you could accept the readings and also would like to know how many times each day she needs to check BP. Thanks.

## 2016-11-14 NOTE — Telephone Encounter (Signed)
Rings w/o answer, eventually goes to VM message stating "memory is full". No means to leave VM for patient.

## 2016-11-16 NOTE — Telephone Encounter (Signed)
Same msg "memory full" when I called patient this AM.

## 2016-11-18 ENCOUNTER — Telehealth: Payer: Self-pay

## 2016-11-18 NOTE — Telephone Encounter (Signed)
Received blood pressures as requested - see office note from 11/02/16.  11/03/16 - 0000000 (took 1/2 Bystolic and 1/2 diuretic) 11/04/16 - [id number] (took 1/2 Bystolic and 1/2 diuretic) 11/05/16 - 123456 (took 1/2 Bystolic and 1/2 diuretic) 11/06/16 - 123XX123 (took 1/2 Bystolic and 1/2 diuretic) 11/07/16 - BP-126/67 11/08/16 - BP-135/82 11/09/16 - BP-160/82 11/10/16 - BP-128/73 11/11/16 - BP-121/59 11/12/16 - BP-125/81 11/13/16 - BP-138/83 11/14/16 - BP-139/66 11/15/16 - BP-122/93 11/16/16 - 7:30a BP-117/84, 4:00p BP-174/92  Per MCr, mostly in normal range (11/16/16 PM BP outlier value) Continue current medications.  Called patient and reviewed physician comments. Patient states that on the 24th, she took the Clorthalidone late (around 3:45p) without eating. States she because disoriented, confused, and started coughing/dryheaving. States that she cannot remember much else from this event. She had no complications with medication prior to this encounter and took the medication yesterday without complications.   Discussed with MCr. Chlorthalidone is such a slow acting medication that he thinks the BP and symptoms are unrelated.  Returned call to patient with additional comments.

## 2016-11-29 ENCOUNTER — Telehealth: Payer: Self-pay | Admitting: Cardiovascular Disease

## 2016-11-29 ENCOUNTER — Other Ambulatory Visit: Payer: Self-pay | Admitting: *Deleted

## 2016-11-29 ENCOUNTER — Other Ambulatory Visit: Payer: BC Managed Care – PPO

## 2016-11-29 DIAGNOSIS — I951 Orthostatic hypotension: Secondary | ICD-10-CM

## 2016-11-29 LAB — BASIC METABOLIC PANEL
BUN: 20 mg/dL (ref 7–25)
CALCIUM: 9.1 mg/dL (ref 8.6–10.4)
CO2: 31 mmol/L (ref 20–31)
CREATININE: 1.15 mg/dL — AB (ref 0.50–0.99)
Chloride: 99 mmol/L (ref 98–110)
GLUCOSE: 100 mg/dL — AB (ref 65–99)
POTASSIUM: 3.6 mmol/L (ref 3.5–5.3)
Sodium: 139 mmol/L (ref 135–146)

## 2016-11-29 NOTE — Telephone Encounter (Signed)
Left message for pt to call.

## 2016-11-29 NOTE — Telephone Encounter (Signed)
Follow up   Pt verbalized that she is returning call for rn and that she will be at that number listed on contact page until 4pm

## 2016-11-29 NOTE — Telephone Encounter (Signed)
Follow up ° °Pt voiced returning nurses call. ° °Please f/u °

## 2016-11-29 NOTE — Telephone Encounter (Signed)
No answer, goes to VM. Left msg for pt to inform I would let Dr. Sallyanne Kuster know - she reports experiencing the reported side effects from the 12.5mg  chlorthalidone. Advice for discontinuation of this? Need for alternate medication?

## 2016-11-29 NOTE — Telephone Encounter (Signed)
Spoke with pt, she currently has no swelling. She agrees to taking the medication every other day. She will come by for lab work this week. Order placed. She will call with further problems.

## 2016-11-29 NOTE — Telephone Encounter (Signed)
Spoke with pt, she had an episode last night were after taking the chlorthalidone where she became anxious and disoriented. She reports she just did not feel well. She did not check her bp. She has been taking the chlorthalidone for 2-3 weeks. She is not sure if that is the cause of her symptoms but she would like to see about changing top something else. Will forward this to dr croitoru to review and advise.

## 2016-11-29 NOTE — Telephone Encounter (Signed)
Has her pedal edema improved at all (the purpose of this medication was to help both with blood pressure and the swelling)? We might be able to just have her take the chlorthalidone every other day and see if that works well enough. Also probably worth checking a BMET. MCr

## 2016-11-29 NOTE — Telephone Encounter (Signed)
New Message     Pt c/o medication issue:  1. Name of Medication:  chlorthalidone (HYGROTON) 25 MG tablet Take 0.5 tablets (12.5 mg total) by mouth daily.     2. How are you currently taking this medication (dosage and times per day)? .5 tablet a day  3. Are you having a reaction (difficulty breathing--STAT)? No   4. What is your medication issue? Causing her to be confused , and disoriented , making her feel anxious too

## 2016-12-01 ENCOUNTER — Telehealth: Payer: Self-pay | Admitting: Cardiovascular Disease

## 2016-12-01 MED ORDER — FUROSEMIDE 20 MG PO TABS
ORAL_TABLET | ORAL | 6 refills | Status: DC
Start: 2016-12-01 — End: 2017-02-10

## 2016-12-01 NOTE — Telephone Encounter (Signed)
Returned call to patient lab results given.She stated she cannot take chlorthalidone causes confusion.Advised I will send message to Dr.Croitoru for advice.

## 2016-12-01 NOTE — Telephone Encounter (Signed)
Returned call to patient no answer.LMTC. 

## 2016-12-01 NOTE — Telephone Encounter (Signed)
New Message     Please call at this number until 4pm, she is checking on her test results from Monday

## 2016-12-01 NOTE — Telephone Encounter (Signed)
OK. Stop chlorthalidone and try furosemide 20 mg every other day. MCr

## 2016-12-01 NOTE — Telephone Encounter (Signed)
Returned call to patient Dr.Croitoru's recommendations given.Advised to call back if needed.

## 2016-12-01 NOTE — Telephone Encounter (Signed)
new message   pt verbalized that she is calling for the rn to return her call  She is at lunch from 12-1pm

## 2016-12-20 ENCOUNTER — Ambulatory Visit: Payer: BC Managed Care – PPO | Admitting: Adult Health

## 2016-12-21 ENCOUNTER — Telehealth: Payer: Self-pay | Admitting: Cardiovascular Disease

## 2016-12-21 NOTE — Telephone Encounter (Signed)
New Message  Pt call stating she received a call. Pt states not VM. Please call back to discuss if needed.

## 2016-12-21 NOTE — Telephone Encounter (Signed)
I cannot locate recent encounter or recall notification. Please advise.

## 2017-01-02 ENCOUNTER — Telehealth: Payer: Self-pay | Admitting: Cardiovascular Disease

## 2017-01-02 NOTE — Telephone Encounter (Signed)
Left message

## 2017-01-02 NOTE — Telephone Encounter (Signed)
New Message     Pt c/o medication issue:  1. Name of Medication:  atorvastatin (LIPITOR) 10 MG tablet Take 1 tablet (10 mg total) by mouth daily at 6 PM.     2. How are you currently taking this medication (dosage and times per day)?  1/2 tablet 630p  3. Are you having a reaction (difficulty breathing--STAT)? no  4. What is your medication issue?  Pill is making her dizzy is there something else she can take in its place

## 2017-01-04 NOTE — Telephone Encounter (Signed)
Attempted to call the home number and was instructed that I should try her work number. There was not any answer on that number.

## 2017-01-05 NOTE — Telephone Encounter (Signed)
Spoke with pt, she noticed a confusion when took lipitor at bedtime. She admits to not eating a lot prior to taking. She eats her biggest meal at lunch. Advised patient she can take the lipitor at lunch if would be better for her. She reports she has had no more dizziness since she has been taking 1/2 tablet daily. She will call back with further questions.

## 2017-01-05 NOTE — Telephone Encounter (Signed)
Left message for pt to call.

## 2017-01-06 ENCOUNTER — Telehealth: Payer: Self-pay | Admitting: Cardiovascular Disease

## 2017-01-06 NOTE — Telephone Encounter (Signed)
Follow up     Pt is returning [place] call ,she will be there til 4pm

## 2017-01-06 NOTE — Telephone Encounter (Signed)
Returned call to patient:  Patient has a stent in renal artery When she was taking her whole fluid pill she was having symptoms (?) She takes a fluid pill - lasix 10mg  daily Yesterday she didn't eat as much - has been trying to cut back  - she had some fruit, biscuit, [employer] fish sandwich some FF, ate a Ritz cracker w/lasix pill When she left for work yesterday and got in the car, she had a "strange feeling like something shocks you" - this lasted for a few minutes - had a "stare" She is borderline diabetic - but sounds like it could be related to her blood sugar - which she was wondering as well.  She will contact her PCP for advice.   Of note, patient spoke w/Debra RN with similar(ish) complaints 3/15 - noted below: January 05, 2017  Cristopher Estimable, RN  9:02 AM  Note    Spoke with pt, she noticed a confusion when took lipitor at bedtime. She admits to not eating a lot prior to taking. She eats her biggest meal at lunch. Advised patient she can take the lipitor at lunch if would be better for her. She reports she has had no more dizziness since she has been taking 1/2 tablet daily. She will call back with further questions.

## 2017-01-06 NOTE — Telephone Encounter (Signed)
New Message    Per pt was instructed to call back regarding diuretic... Requesting call back from nurse

## 2017-01-06 NOTE — Telephone Encounter (Signed)
LMTCB

## 2017-01-11 NOTE — Telephone Encounter (Signed)
I called the patient, she was in the office with her primary care physician. She had an event at work where she was pulling a wrapper off of a sandwich, she began staring off, did not respond to people around her.  The patient did not fall down. She had problems with sleeping the night before, was sleep deprived.  Patient likely had a seizure event, she was only taking 50 mg twice daily of the [name], she cannot tolerate the 100 mg twice daily dose, she will go to 50 mg in the morning and 100 mg in the evening of the [name]. If she continues to have episodes, we will need to consider switching over to another medication.

## 2017-01-11 NOTE — Telephone Encounter (Signed)
Patient called office in reference to having a seizure this morning.  Patient states she is taking the [name] 100 half in the morning other half in the evening.  Please call

## 2017-01-11 NOTE — Telephone Encounter (Addendum)
Called patient back. She stated this am she woke up feeling slugglish. For breakfast, she ate half peanut butter sandwhich, half banana and activia yogurt. She also stopped at [facility] and ate half egg mcmuffin with sausage. She took BP pill, Vit B and Vit D. Took half vimpat (50mg ) and ASA. She is cutting 100mg  tablet vimpat in half and taking this twice a day. She states she cannot tolerate 100mg  twice daily. Makes her too sleepy.   She got to work, talking with co-worker. Co-workers stated she started coughing.  She started to "feel funny". She did not pass out. She could not describe any further. Co-worker stated she went into a stare spell.Dry cough started this am. Has occurred before. At least 2-3 times at work. Curtain fell at work, chair fell. Episode lasted about a min or so. She was not sure how long. She denies falling, hitting head or any injuries.   Denies missing doses of vimpat.   She checked BP after episode. It was: 188/105. This was around 8am.   Advised I will send message to CW,MD and we will call back to advise. She verbalized understanding.  She states she called from husband number. He is on DPR. Ok to speak with him. She was going to another doctor appt right now.

## 2017-01-15 ENCOUNTER — Encounter: Payer: Self-pay | Admitting: Neurology

## 2017-01-18 ENCOUNTER — Ambulatory Visit (INDEPENDENT_AMBULATORY_CARE_PROVIDER_SITE_OTHER): Payer: BC Managed Care – PPO | Admitting: Adult Health

## 2017-01-18 ENCOUNTER — Encounter: Payer: Self-pay | Admitting: Adult Health

## 2017-01-18 VITALS — BP 158/79 | HR 78 | Ht 62.0 in | Wt 221.2 lb

## 2017-01-18 DIAGNOSIS — Z9989 Dependence on other enabling machines and devices: Secondary | ICD-10-CM | POA: Diagnosis not present

## 2017-01-18 DIAGNOSIS — R569 Unspecified convulsions: Secondary | ICD-10-CM

## 2017-01-18 DIAGNOSIS — G4733 Obstructive sleep apnea (adult) (pediatric): Secondary | ICD-10-CM

## 2017-01-18 NOTE — Progress Notes (Signed)
PATIENT: Sara [name] DOB: 05-12-1956  REASON FOR VISIT: follow up- seizures, obstructive sleep apnea HISTORY FROM: patient  HISTORY OF PRESENT ILLNESS: Sara [name] is a 61 year old female with a history of obstructive sleep apnea and seizures. She returns today for follow-up. She had a seizure in December and her [name] was increased to 50 mg in the morning and 100 mg in the evening. Prior to this after Dr. Jannifer Franklin tried increase her [name] 200 mg twice a day however she was unable to tolerate this. She states that she has not had any additional seizure events since December. However the patient is quite frustrated that she does not understand what is causing her seizures. She reports that she has been operating a motor vehicle. She is under quite a bit of stress with her job and home life. She states that she doesn't always sleep well and sometimes does wake up frequently. Her CPAP download indicates that she used her CPAP nightly for the last 30 days. She is also used the machine greater than 4 hours each night. Her average usage is 6 hours and 55 minutes. Her residual AHI is 0.9 on 9 cm of water with EPR of 2. She does not have any significantly. She returns today for follow-up.    REVIEW OF SYSTEMS: Out of a complete 14 system review of symptoms, the patient complains only of the following symptoms, and all other reviewed systems are negative.  Epworth sleepiness score 4  ALLERGIES: Allergies  Allergen Reactions  . Sulfa Antibiotics Itching    HOME MEDICATIONS: Outpatient Medications Prior to Visit  Medication Sig Dispense Refill  . anagrelide ([name]) 1 MG capsule Take 1 capsule (1 mg total) by mouth daily. 30 capsule 5  . aspirin EC 81 MG tablet Take 81 mg by mouth daily.    Marland Kitchen atorvastatin (LIPITOR) 10 MG tablet Take 1 tablet (10 mg total) by mouth daily at 6 PM. 90 tablet 3  . B Complex Vitamins (B COMPLEX PO) Take 1 tablet by mouth daily.     . Cholecalciferol (VITAMIN  D3) 2000 UNITS capsule Take 2,000 Units by mouth daily.     . furosemide (LASIX) 20 MG tablet Take 20 mg every other day 30 tablet 6  . Lacosamide 100 MG TABS Take 1 tablet (100 mg total) by mouth 2 (two) times daily. (Patient taking differently: Take by mouth 2 (two) times daily. Taking 50mg  po AM and 100mg  po PM.) 180 tablet 3  . telmisartan (MICARDIS) 20 MG tablet Take 20 mg by mouth 2 (two) times daily.   0   No facility-administered medications prior to visit.     PAST MEDICAL HISTORY: Past Medical History:  Diagnosis Date  . Diabetes mellitus   . Gout   . Hyperlipemia   . Hypertension   . LVH (left ventricular hypertrophy)    12/21/09 Echo -mild asymmetric LVH,EF =>55%  . Morbid obesity ([place])   . OSA (obstructive sleep apnea)   . PAD (peripheral artery disease) ([place])   . Renal artery stenosis ([place]) 12/13/2010   PTCA and Stent - right  . Syncopal episodes   . Thrombocytosis ([place])     PAST SURGICAL HISTORY: Past Surgical History:  Procedure Laterality Date  . BREAST CYST EXCISION     S/P Benign Right Breast Cyst Removal.  . BREAST REDUCTION SURGERY  1995   S/P Bilateral breast reduction  . [place]   Times  Two.  Marland Kitchen EXTREMITY CYST  EXCISION  1993   left wrist  . PARTIAL HYSTERECTOMY  1994  . renal artery stent placement Right     FAMILY HISTORY: Family History  Problem Relation Age of Onset  . Heart failure Mother   . Diabetes Father   . Diabetes Sister   . Hypertension Sister   . Hypertension Sister   . Hypertension Sister   . Breast cancer Maternal Aunt     2 maternal aunts had breast cancer.  . Breast cancer Maternal Grandmother     SOCIAL HISTORY: Social History   Social History  . Marital status: Married    Spouse name: Sara [name]  . Number of children: 2  . Years of education: 16   Occupational History  . [employer]. [place] History Main Topics  . Smoking status: Never Smoker  . Smokeless  tobacco: Never Used  . Alcohol use No  . Drug use: No  . Sexual activity: Yes   Other Topics Concern  . Not on file   Social History Narrative   Patient lives at husband and her son, home with family.   Patient has two adult children.   Patient is not drinking any caffeine.   Patient is working full-time.   Patient has a college education.   Patient is right-handed.      PHYSICAL EXAM  Vitals:   01/18/17 0901  BP: (!) 158/79  Pulse: 78  Weight: 221 lb 3.2 oz (100.3 kg)  Height: 5\' 2"  (1.575 m)   Body mass index is 40.46 kg/m.  Generalized: Well developed, in no acute distress   Neurological examination  Mentation: Alert oriented to time, place, history taking. Follows all commands speech and language fluent Cranial nerve II-XII: Pupils were equal round reactive to light. Extraocular movements were full, visual field were full on confrontational test. Facial sensation and strength were normal. Uvula tongue midline. Head turning and shoulder shrug  were normal and symmetric. Motor: The motor testing reveals 5 over 5 strength of all 4 extremities. Good symmetric motor tone is noted throughout.  Sensory: Sensory testing is intact to soft touch on all 4 extremities. No evidence of extinction is noted.  Coordination: Cerebellar testing reveals good finger-nose-finger and heel-to-shin bilaterally.  Gait and station: Gait is normal.  Reflexes: Deep tendon reflexes are symmetric and normal bilaterally.   DIAGNOSTIC DATA (LABS, IMAGING, TESTING) - I reviewed patient records, labs, notes, testing and imaging myself where available.  Lab Results  Component Value Date   WBC 7.7 10/04/2016   HGB 14.2 10/04/2016   HCT 44.2 10/04/2016   MCV 98.8 10/04/2016   PLT 352 10/04/2016      Component Value Date/Time   NA 139 11/29/2016 1618   NA 143 10/04/2016 1318   K 3.6 11/29/2016 1618   K 3.8 10/04/2016 1318   CL 99 11/29/2016 1618   CL 102 03/19/2013 1535   CO2 31 11/29/2016  1618   CO2 28 10/04/2016 1318   GLUCOSE 100 (H) 11/29/2016 1618   GLUCOSE 123 10/04/2016 1318   GLUCOSE 117 (H) 03/19/2013 1535   BUN 20 11/29/2016 1618   BUN 17.5 10/04/2016 1318   CREATININE 1.15 (H) 11/29/2016 1618   CREATININE 1.1 10/04/2016 1318   CALCIUM 9.1 11/29/2016 1618   CALCIUM 9.4 10/04/2016 1318   PROT 7.3 10/04/2016 1318   ALBUMIN 3.4 (L) 10/04/2016 1318   AST 20 10/04/2016 1318   ALT 21 10/04/2016 1318   ALKPHOS 86 10/04/2016 1318  BILITOT 0.47 10/04/2016 1318   GFRNONAA 50 (L) 08/04/2016 1824   GFRAA 58 (L) 08/04/2016 1824        ASSESSMENT AND PLAN 61 y.o. year old female  has a past medical history of Diabetes mellitus; Gout; Hyperlipemia; Hypertension; LVH (left ventricular hypertrophy); Morbid obesity ([place]); OSA (obstructive sleep apnea); PAD (peripheral artery disease) ([place]); Renal artery stenosis ([place]) (12/13/2010); Syncopal episodes; and Thrombocytosis ([place]). here with :  1. Seizures 2. Obstructive sleep apnea on CPAP  The patient CPAP download shows excellent compliance. She is encouraged to continue using the CPAP nightly. The patient will remain on [name] 50 mg in the morning and 100 mg in the evening. She is advised that she should not miss or adjust her dosing without talking to our office. She should not operate a motor vehicle until she is seizure-free for 6 months. Despite me explaining the possible etiology of seizures she is still frustrated that she has seizure events. Advised that she should try to get adequate sleep and decrease her stress if possible. She will follow-up in 6 months with Dr. Jannifer Franklin.   Ward Givens, MSN, NP-C 01/18/2017, 9:16 AM [facility] [address], [place], [place] [address] [phone]

## 2017-01-18 NOTE — Patient Instructions (Signed)
Continue taking [name] 50 mg in the morning and 100 mg at bedtime No driving until seizure free for 6 months Please avoid triggers for seizures such as sleep deprivation  Continue using CPAP nightly If your symptoms worsen or you develop new symptoms please let us know.   Seizure, Adult When you have a seizure:  Parts of your body may move.  How aware or awake (conscious) you are may change.  You may shake (convulse). Some people have symptoms right before a seizure happens. These symptoms may include:  Fear.  Worry (anxiety).  Feeling like you are going to throw up (nausea).  Feeling like the room is spinning (vertigo).  Feeling like you saw or heard something before (deja vu).  Odd tastes or smells.  Changes in vision, such as seeing flashing lights or spots. Seizures usually last from 30 seconds to 2 minutes. Usually, they are not harmful unless they last a long time. Follow these instructions at home: Medicines   Take over-the-counter and prescription medicines only as told by your doctor.  Avoid anything that may keep your medicine from working, such as alcohol. Activity   Do not do any activities that would be dangerous if you had another seizure, like driving or swimming. Wait until your doctor approves.  If you live in the [place] ask your local DMV (department of motor vehicles) when you can drive.  Rest. Teaching others   Teach friends and family what to do when you have a seizure. They should:  Lay you on the ground.  Protect your head and body.  Loosen any tight clothing around your neck.  Turn you on your side.  Stay with you until you are better.  Not hold you down.  Not put anything in your mouth.  Know whether or not you need emergency care. General instructions   Contact your doctor each time you have a seizure.  Avoid anything that gives you seizures.  Keep a seizure diary. Write down:  What you think caused each seizure.  What  you remember about each seizure.  Keep all follow-up visits as told by your doctor. This is important. Contact a doctor if:  You have another seizure.  You have seizures more often.  There is any change in what happens during your seizures.  You continue to have seizures with treatment.  You have symptoms of being sick or having an infection. Get help right away if:  You have a seizure:  That lasts longer than 5 minutes.  That is different than seizures you had before.  That makes it harder to breathe.  After you hurt your head.  After a seizure, you cannot speak or use a part of your body.  After a seizure, you are confused or have a bad headache.  You have two or more seizures in a row.  You are having seizures more often.  You do not wake up right after a seizure.  You get hurt during a seizure. In an emergency:  These symptoms may be an emergency. Do not wait to see if the symptoms will go away. Get medical help right away. Call your local emergency services (911 in the [place] Do not drive yourself to the hospital. This information is not intended to replace advice given to you by your health care provider. Make sure you discuss any questions you have with your health care provider. Document Released: 03/28/2008 Document Revised: 06/22/2016 Document Reviewed: 06/22/2016 Elsevier Interactive Patient Education  2017 Elsevier  Inc.  

## 2017-01-18 NOTE — Progress Notes (Signed)
I have read the note, and I agree with the clinical assessment and plan.  Eyan Hagood KEITH   

## 2017-01-28 ENCOUNTER — Telehealth: Payer: Self-pay | Admitting: Hematology

## 2017-01-28 NOTE — Telephone Encounter (Signed)
Off day - moved 4/10 lab/fu to 4/19 per YF. Not able to reach patient or leave message will try to call again Monday.

## 2017-01-30 NOTE — Telephone Encounter (Signed)
Spoke with patient re new date/time for 4/19

## 2017-01-31 ENCOUNTER — Ambulatory Visit: Payer: BC Managed Care – PPO | Admitting: Hematology

## 2017-01-31 ENCOUNTER — Other Ambulatory Visit: Payer: BC Managed Care – PPO

## 2017-02-09 ENCOUNTER — Other Ambulatory Visit (HOSPITAL_BASED_OUTPATIENT_CLINIC_OR_DEPARTMENT_OTHER): Payer: BC Managed Care – PPO

## 2017-02-09 ENCOUNTER — Encounter: Payer: Self-pay | Admitting: Hematology

## 2017-02-09 ENCOUNTER — Ambulatory Visit (HOSPITAL_BASED_OUTPATIENT_CLINIC_OR_DEPARTMENT_OTHER): Payer: BC Managed Care – PPO | Admitting: Hematology

## 2017-02-09 ENCOUNTER — Telehealth: Payer: Self-pay | Admitting: Hematology

## 2017-02-09 ENCOUNTER — Telehealth: Payer: Self-pay | Admitting: Cardiovascular Disease

## 2017-02-09 VITALS — BP 144/64 | HR 95 | Temp 98.3°F | Resp 18 | Ht 62.0 in | Wt 226.7 lb

## 2017-02-09 DIAGNOSIS — R569 Unspecified convulsions: Secondary | ICD-10-CM | POA: Diagnosis not present

## 2017-02-09 DIAGNOSIS — N179 Acute kidney failure, unspecified: Secondary | ICD-10-CM

## 2017-02-09 DIAGNOSIS — R55 Syncope and collapse: Secondary | ICD-10-CM

## 2017-02-09 DIAGNOSIS — D473 Essential (hemorrhagic) thrombocythemia: Secondary | ICD-10-CM | POA: Diagnosis not present

## 2017-02-09 LAB — CBC WITH DIFFERENTIAL/PLATELET
BASO%: 0.9 % (ref 0.0–2.0)
Basophils Absolute: 0.1 10*3/uL (ref 0.0–0.1)
EOS%: 4.1 % (ref 0.0–7.0)
Eosinophils Absolute: 0.3 10*3/uL (ref 0.0–0.5)
HCT: 40.8 % (ref 34.8–46.6)
HGB: 13.7 g/dL (ref 11.6–15.9)
LYMPH%: 26.5 % (ref 14.0–49.7)
MCH: 29.8 pg (ref 25.1–34.0)
MCHC: 33.5 g/dL (ref 31.5–36.0)
MCV: 89 fL (ref 79.5–101.0)
MONO#: 0.5 10*3/uL (ref 0.1–0.9)
MONO%: 6.9 % (ref 0.0–14.0)
NEUT#: 4.6 10*3/uL (ref 1.5–6.5)
NEUT%: 61.6 % (ref 38.4–76.8)
PLATELETS: 422 10*3/uL — AB (ref 145–400)
RBC: 4.58 10*6/uL (ref 3.70–5.45)
RDW: 13.1 % (ref 11.2–14.5)
WBC: 7.5 10*3/uL (ref 3.9–10.3)
lymph#: 2 10*3/uL (ref 0.9–3.3)

## 2017-02-09 LAB — COMPREHENSIVE METABOLIC PANEL
ALT: 19 U/L (ref 0–55)
ANION GAP: 10 meq/L (ref 3–11)
AST: 17 U/L (ref 5–34)
Albumin: 3.3 g/dL — ABNORMAL LOW (ref 3.5–5.0)
Alkaline Phosphatase: 85 U/L (ref 40–150)
BUN: 19.1 mg/dL (ref 7.0–26.0)
CHLORIDE: 106 meq/L (ref 98–109)
CO2: 27 meq/L (ref 22–29)
CREATININE: 1.3 mg/dL — AB (ref 0.6–1.1)
Calcium: 9.1 mg/dL (ref 8.4–10.4)
EGFR: 53 mL/min/{1.73_m2} — ABNORMAL LOW (ref >90)
Glucose: 170 mg/dl — ABNORMAL HIGH (ref 70–140)
POTASSIUM: 3.7 meq/L (ref 3.5–5.1)
Sodium: 143 mEq/L (ref 136–145)
Total Bilirubin: 0.31 mg/dL (ref 0.20–1.20)
Total Protein: 6.8 g/dL (ref 6.4–8.3)

## 2017-02-09 NOTE — Progress Notes (Signed)
[facility] OFFICE PROGRESS NOTE   [name], MD [address] [place] [address]  DIAGNOSIS: Essential thrombocythemia [facility])  CC: follow up Essential Thrombocytosis  CURRENT THERAPY: Hydroxyurea 500 mg daily, started in 2010., increased to hydrea 1000mg  daily on Monday and Thursday, and 500mg  daily for the rest of week in 01/2015,  Aspirin 81 mg daily.   INTERVAL HISTORY:   Sara [name] 61 y.o. female with history of JAK positive Essential thrombocythemia presents for follow up. She is accompanied by her husband.   She reports she is doing well. She is still working on diagnosing recent seizure activity, and reports she has been told she cannot drive until June. She reports no one can tell her what caused her seizure. She is taking Anagrelide daily in the morning; she denies any issues with this medication. She reports her lower extremity edema has decreased recently.    MEDICAL HISTORY: Past Medical History:  Diagnosis Date  . Diabetes mellitus   . Gout   . Hyperlipemia   . Hypertension   . LVH (left ventricular hypertrophy)    12/21/09 Echo -mild asymmetric LVH,EF =>55%  . Morbid obesity ([place])   . OSA (obstructive sleep apnea)   . PAD (peripheral artery disease) ([place])   . Renal artery stenosis ([place]) 12/13/2010   PTCA and Stent - right  . Syncopal episodes   . Thrombocytosis ([facility])      ALLERGIES:  is allergic to sulfa antibiotics.  MEDICATIONS:  Allergies as of 02/09/2017      Reactions   Sulfa Antibiotics Itching      Medication List       Accurate as of 02/09/17  2:56 PM. Always use your most recent med list.          anagrelide 1 MG capsule Commonly known as:  [name] Take 1 capsule (1 mg total) by mouth daily.   aspirin EC 81 MG tablet Take 81 mg by mouth daily.   atorvastatin 10 MG tablet Commonly known as:  LIPITOR Take 1 tablet (10 mg total) by mouth daily at 6 PM.   B COMPLEX PO Take 1 tablet by  mouth daily.   furosemide 20 MG tablet Commonly known as:  LASIX Take 20 mg every other day   Lacosamide 100 MG Tabs Take 1 tablet (100 mg total) by mouth 2 (two) times daily.   OVER THE COUNTER MEDICATION Rem Fresh  Nightly for sleep   telmisartan 20 MG tablet Commonly known as:  MICARDIS Take 20 mg by mouth 2 (two) times daily.   Vitamin D3 2000 units capsule Take 2,000 Units by mouth daily.       SURGICAL HISTORY:  Past Surgical History:  Procedure Laterality Date  . BREAST CYST EXCISION     S/P Benign Right Breast Cyst Removal.  . BREAST REDUCTION SURGERY  1995   S/P Bilateral breast reduction  . [place]   Times  Two.  Marland Kitchen EXTREMITY CYST EXCISION  1993   left wrist  . PARTIAL HYSTERECTOMY  1994  . renal artery stent placement Right     REVIEW OF SYSTEMS:  Constitutional: Denies fevers, chills or abnormal weight loss Eyes: Denies blurriness of vision Ears, nose, mouth, throat, and face: Denies mucositis or sore throat Respiratory: Denies cough, dyspnea or wheezes Cardiovascular: Denies palpitation, chest discomfort or lower extremity swelling Gastrointestinal:  Denies nausea, heartburn. Skin: Denies abnormal skin rashes Lymphatics: Denies new lymphadenopathy or  easy bruising Neurological:Denies numbness, tingling or new weaknesses. (+) Intermittent syncope Behavioral/Psych: Mood is stable, no new changes  All other systems were reviewed with the patient and are negative.  PHYSICAL EXAMINATION: ECOG PERFORMANCE STATUS: 0  Blood pressure (!) 144/64, pulse 95, temperature 98.3 F (36.8 C), temperature source Oral, resp. rate 18, height 5\' 2"  (1.575 m), weight 226 lb 11.2 oz (102.8 kg), SpO2 97 %.  GENERAL:alert, no distress and comfortable; obese pleasant middle-age woman SKIN: skin color, texture, turgor are normal, no rashes or significant lesions EYES: normal, Conjunctiva are pink and non-injected, sclera clear OROPHARYNX:no exudate, no  erythema and lips, buccal mucosa, and tongue normal  NECK: supple, thyroid normal size, non-tender, without nodularity LYMPH:  no palpable lymphadenopathy in the cervical, axillary or inguinal LUNGS: clear to auscultation and percussion with normal breathing effort HEART: regular rate & rhythm and no murmurs and no lower extremity edema ABDOMEN:abdomen soft, non-tender,normal bowel sounds Musculoskeletal:no cyanosis of digits and no clubbing  NEURO: alert & oriented x 3 with fluent speech, no focal motor/sensory deficits   LABORATORY DATA:  CBC Latest Ref Rng & Units 02/09/2017 10/04/2016 09/20/2016  WBC 3.9 - 10.3 10e3/uL 7.5 7.7 6.9  Hemoglobin 11.6 - 15.9 g/dL 13.7 14.2 13.2  Hematocrit 34.8 - 46.6 % 40.8 44.2 39.7  Platelets 145 - 400 10e3/uL 422(H) 352 253    CMP Latest Ref Rng & Units 02/09/2017 11/29/2016 10/04/2016  Glucose 70 - 140 mg/dl 170(H) 100(H) 123  BUN 7.0 - 26.0 mg/dL 19.1 20 17.5  Creatinine 0.6 - 1.1 mg/dL 1.3(H) 1.15(H) 1.1  Sodium 136 - 145 mEq/L 143 139 143  Potassium 3.5 - 5.1 mEq/L 3.7 3.6 3.8  Chloride 98 - 110 mmol/L - 99 -  CO2 22 - 29 mEq/L 27 31 28   Calcium 8.4 - 10.4 mg/dL 9.1 9.1 9.4  Total Protein 6.4 - 8.3 g/dL 6.8 - 7.3  Total Bilirubin 0.20 - 1.20 mg/dL 0.31 - 0.47  Alkaline Phos 40 - 150 U/L 85 - 86  AST 5 - 34 U/L 17 - 20  ALT 0 - 55 U/L 19 - 21   Pathology report 01/20/2009 BONE MARROW, RIGHT POSTERIOR ILIAC CREST, BIOPSY AND ASPIRATION: - CELLULAR MARROW WITH TRILINEAGE HEMATOPOIESIS AND MATURATION. - NO EVIDENCE OF EXCESS BLASTS.   BONE MARROW ASPIRATE: The aspirate material is composed of a few somewhat cellular bone marrow particles displaying a mixture of cell types. Erythroid and granulocytic series show orderly and progressive maturation. Megakaryocytes are abundant with predominantly normal morphology.  BONE MARROW CORE BIOPSY: The bone marrow core biopsy has very prominent aspiration artifact. Sections show 40 to  60% cellularity with a mixture of cell types. The megakaryocytes are abundant with normal morphology. No megakaryocyte clustering is seen. The granulocytic series and erythroid series show orderly and progressive maturation. No excess blasts are seen.  Karyotype: 46,XX[20].nuc ish (ABL,BCR)x2  Interpretation: Cytogenetic Analysis:Normal: Cytogenetic analysis revealed the presence of normal female chromosomes with no observable clonal chromosomal abnormalities.  Molecular Cytogenetic Analysis FISH: Normal: The investigative technique of molecular cytogenetic analysis with DNA probes specific for the Major/Minor ABL (9q34) and BCR (22q11) genes revealed the lack of significant number of BCR/ABL fusion signals. These results are consistent with a normal finding and a lack of the BCR/ABL fusion associated with CML/ALL.  RADIOGRAPHIC STUDIES:  CT Abdomen without Contrast 12/30/15 IMPRESSION: 1. Midline supraumbilical ventral hernia contains unobstructed colon. 2. Two smaller fat containing periumbilical hernias.  No new scans   ASSESSMENT: Essential thrombocythemia.  Diagnosed in 2010. JAK2 mutation was present. The patient underwent a bone marrow aspirate and biopsy on 01/20/2009 which yielded a specimen that was suboptimal for evaluation. However, megakaryocytes were abundant with normal morphology. There was no clustering or excess blasts seen. Platelet count on 01/20/2009 was 881. The patient is being treated with aspirin and Anagrelide.   PLAN:  1. Essential Thrombocythemia. JAK2 (+)  --I previously reviewed her above laboratory results and history extensively. I explained to her this chronic disease, not curable but treatable. Small percentage patient will develop myelofibrosis or leukemia in late stage. The disease related risk is thrombosis, including stroke and heart attack. -Her platelet count has been well controlled, 422 today.  -Her previous syncope and seizure  likely activities are probably not related to hydrea, also I cannot rule out completely. -The patient was concerned about long-term side effects from Hydrea, especially risk of leukemia. -I have previously switched her Hydrea to anagrelide, she is tolerating very well, her platelet counts has been normal. Will continue anagrelide -We'll monitor her CBC in 2 months due to slightly elevated plt today, we'll titrate her anagrelide dose if needed. -Continue aspirin. - I encouraged the patient to maintain good hydration.  2. Seizure -continue meds, follow up with her neurologist.  3. Syncope episode -Unclear etiology. -I suspect the possibility of her syncope is probably not related to medications.  4. AKI -She has slightly elevated creatinine today, likely related to her diuretics, which was recommended by her cardiologist. -I encouraged patient to discuss with her cardiologist to see if needed she needs back of her diuretics. She has no clinical signs of edema or fluid overloaded.  PLAN: - Labs reviewed today with the patient and her husband. Slightly elevated creatinine.  - The patient will talk to her cardiologist about her recent lab results and her diuretic medication. - Labs in 2 months. - Return to clinic for follow up in 4 months.  All questions were answered. The patient knows to call the clinic with any problems, questions or concerns. We can certainly see the patient much sooner if necessary.  I spent 20 minutes counseling the patient face to face. The total time spent in the appointment was 15 minutes.  This document serves as a record of services personally performed by Truitt Merle, MD. It was created on her behalf by Maryla Morrow, a trained medical scribe. The creation of this record is based on the scribe's personal observations and the provider's statements to them. This document has been checked and approved by the attending provider.   Truitt Merle  02/09/2017

## 2017-02-09 NOTE — Telephone Encounter (Signed)
New Message    Per pt had doctor appt today, and had lab work done. Pt stated that she takes furosemide (LASIX) 20 MG tablet and they told her it could be affecting her kidneys. Requesting call back

## 2017-02-09 NOTE — Telephone Encounter (Signed)
Left message to call back, per DPR. 

## 2017-02-09 NOTE — Telephone Encounter (Signed)
Gave patient AVS and calender per 4/19 los.  

## 2017-02-10 NOTE — Telephone Encounter (Signed)
Left a message to call back, per DPR. 

## 2017-02-10 NOTE — Telephone Encounter (Signed)
Spoke to the patient. She stated that she saw her physician at the [facility] and had labs drawn. Her physician recommended that she speak to her cardiologist about her lasix since it was elevated. The patient was wondering if it should be reduced or discontinued. She denies any edema or shortness of breath.     Ref Range & Units 1d ago 32moago   Sodium 136 - 145 mEq/L 143  139R    Potassium 3.5 - 5.1 mEq/L 3.7  3.6R    Chloride 98 - 109 mEq/L 106  99R    CO2 22 - 29 mEq/L 27  31R    Glucose 70 - 140 mg/dl 170   100R    Comments: Glucose reference range is for nonfasting patients. Fasting glucose reference range is 70- 100.   BUN 7.0 - 26.0 mg/dL 19.1  20R    Creatinine 0.6 - 1.1 mg/dL 1.3   1.15R, CM     Total Bilirubin 0.20 - 1.20 mg/dL 0.31     Alkaline Phosphatase 40 - 150 U/L 85     AST 5 - 34 U/L 17     ALT 0 - 55 U/L 19     Total Protein 6.4 - 8.3 g/dL 6.8     Albumin 3.5 - 5.0 g/dL 3.3      Calcium 8.4 - 10.4 mg/dL 9.1  9.1R    Anion Gap 3 - 11 mEq/L 10     EGFR >90 ml/min/1.73 m2 53

## 2017-02-10 NOTE — Telephone Encounter (Signed)
Change furosemide to 20 mg daily, only as needed for leg edema or weight gain > 3 lb from today's weight

## 2017-02-10 NOTE — Telephone Encounter (Signed)
Spoke to the patient and informed her of the furosemide changes:  Change furosemide to 20 mg daily, only as needed for leg edema or weight gain > 3 lb from today's weight  She verbalized her understanding.

## 2017-02-24 ENCOUNTER — Emergency Department (HOSPITAL_COMMUNITY)
Admission: EM | Admit: 2017-02-24 | Discharge: 2017-02-24 | Disposition: A | Discharge status: Home / Self Care | Payer: BC Managed Care – PPO | Attending: Emergency Medicine | Admitting: Emergency Medicine

## 2017-02-24 ENCOUNTER — Telehealth: Payer: Self-pay | Admitting: Neurology

## 2017-02-24 ENCOUNTER — Encounter (HOSPITAL_COMMUNITY): Payer: Self-pay | Admitting: Emergency Medicine

## 2017-02-24 DIAGNOSIS — I10 Essential (primary) hypertension: Secondary | ICD-10-CM

## 2017-02-24 DIAGNOSIS — E119 Type 2 diabetes mellitus without complications: Secondary | ICD-10-CM | POA: Diagnosis not present

## 2017-02-24 DIAGNOSIS — Z7982 Long term (current) use of aspirin: Secondary | ICD-10-CM | POA: Diagnosis not present

## 2017-02-24 DIAGNOSIS — G40A09 Absence epileptic syndrome, not intractable, without status epilepticus: Secondary | ICD-10-CM

## 2017-02-24 DIAGNOSIS — I110 Hypertensive heart disease with heart failure: Secondary | ICD-10-CM | POA: Insufficient documentation

## 2017-02-24 DIAGNOSIS — Z79899 Other long term (current) drug therapy: Secondary | ICD-10-CM | POA: Diagnosis not present

## 2017-02-24 DIAGNOSIS — R55 Syncope and collapse: Secondary | ICD-10-CM | POA: Diagnosis present

## 2017-02-24 DIAGNOSIS — I5030 Unspecified diastolic (congestive) heart failure: Secondary | ICD-10-CM | POA: Insufficient documentation

## 2017-02-24 LAB — CBC
HEMATOCRIT: 42.6 % (ref 36.0–46.0)
HEMOGLOBIN: 13.6 g/dL (ref 12.0–15.0)
MCH: 29.4 pg (ref 26.0–34.0)
MCHC: 31.9 g/dL (ref 30.0–36.0)
MCV: 92.2 fL (ref 78.0–100.0)
Platelets: 459 10*3/uL — ABNORMAL HIGH (ref 150–400)
RBC: 4.62 MIL/uL (ref 3.87–5.11)
RDW: 12.9 % (ref 11.5–15.5)
WBC: 8.6 10*3/uL (ref 4.0–10.5)

## 2017-02-24 LAB — BASIC METABOLIC PANEL
ANION GAP: 7 (ref 5–15)
BUN: 20 mg/dL (ref 6–20)
CHLORIDE: 104 mmol/L (ref 101–111)
CO2: 30 mmol/L (ref 22–32)
Calcium: 9.2 mg/dL (ref 8.9–10.3)
Creatinine, Ser: 1.18 mg/dL — ABNORMAL HIGH (ref 0.44–1.00)
GFR calc Af Amer: 56 mL/min — ABNORMAL LOW (ref >60)
GFR, EST NON AFRICAN AMERICAN: 48 mL/min — AB (ref >60)
GLUCOSE: 138 mg/dL — AB (ref 65–99)
POTASSIUM: 3.6 mmol/L (ref 3.5–5.1)
Sodium: 141 mmol/L (ref 135–145)

## 2017-02-24 NOTE — Telephone Encounter (Signed)
Patient presented to the hospital with seizure-like activity, now returned back to baseline,  She is only on very low dose of [name] 50/100 mg, reported could not tolerate previous 100 mg in the morning due to sedation, I have advised her to increase [name] to 50/150 mg every day, she has a follow-up visit with Dr. Jannifer Franklin on July 21 2017,

## 2017-02-24 NOTE — Discharge Instructions (Signed)
Your seizure today was likely related to not sleeping well yesterday night. Make sure you're getting plenty of rest, stay well hydrated, and eat meals regularly. Increase your [name] night time dose to 150mg  every night, and continue using the 50mg  dose in the morning. DO NOT DRIVE UNTIL YOU'VE BEEN CLEARED BY YOUR NEUROLOGIST! Eat a low-salt diet and take your blood pressure medications as directed, and follow up with your regular doctor in 1 week for recheck and ongoing management of your blood pressure. Follow up with your regular neurologist in the next 1 week for recheck of symptoms and ongoing management of your seizures. Return to the ER for emergent changes or worsening symptoms.

## 2017-02-24 NOTE — ED Provider Notes (Signed)
[facility] DEPT Provider Note   CSN: [id number] Arrival date & time: 02/24/17  1608     History   Chief Complaint Chief Complaint  Patient presents with  . Loss of Consciousness    HPI Sara [name] is a 61 y.o. female with a PMHx of DM2, absence seizures on [name], HTN, HLD, OSA, LVH, dCHF, PAD, renal artery stenosis s/p stent placement, essential thrombocythemia, and other medical conditions listed below, who presents to the ED with complaints of possible seizure episode that occurred around lunchtime at approximately noon. Patient was in the car with her friend and per the friends report had a staring off episode on the way lunch, apparently friend was yelling at her tried to get her attention and the patient was continuing to stare off into space and not respond. She is unsure whether her eyes were open or not, her friend is not here to report whether patient actually had a full syncopal event or not. Patient states that she felt fine and does not feel like she ever passed out, doesn't know how long this staring episode lasted, and when she "came to" the ambulance and paramedics were around her. She states that initially she remembers that she couldn't tell them that the president was, but quite quickly became alert and oriented in no longer felt confused. She states this episode seems similar to the way that her other seizures have been reported to her before, although unfortunately patient can't recall anything that happened during that "unresponsive" period, nor does she know how long it lasted. She denies memory loss, however she can't tell me those details of timing/duration/etc, so it seems perhaps she had a brief memory lapse. She states that she felt fine afterwards, and has continued to feel fine, however she came in order to be evaluated. She initially refused EMS transport. She states that she is compliant with her [name] 50 mg every morning and 100 mg every night. She admits  that she didn't sleep well last night and thinks this may have precipitated today's event. Per chart review, when she was initially started on [name] back in October 2017, they wanted her on 100mg  BID; however pt didn't tolerate it, so in Dec 2017 they allowed her to go back down to 50mg  qAM and 100mg  QHS, but mentioned that if she continued to have these episodes, they would maybe need to consider alternative therapies. She has been previously on keppra which didn't work well for her. She last saw Dr. Jannifer Franklin' office 01/18/17 and had no changes made at that visit.   She denies lightheadedness, dizziness, syncope, incontinence, tongue biting, persisting confusion, fevers, chills, CP, SOB, abd pain, N/V/D/C, hematuria, dysuria, myalgias, arthralgias, numbness, tingling, focal weakness, HA, vision changes, or any other complaints at this time.    The history is provided by the patient and medical records. No language interpreter was used.  Seizures   This is a recurrent problem. The current episode started 6 to 12 hours ago. The problem has been resolved. There was 1 seizure. Duration: unsure. Pertinent negatives include no confusion (transient), no headaches, no visual disturbance, no chest pain, no nausea, no vomiting and no diarrhea. staring, not responding The episode was witnessed. The seizures did not continue in the ED. The seizure(s) had no focality. Possible causes include sleep deprivation. There has been no fever. There were no medications administered prior to arrival.    Past Medical History:  Diagnosis Date  . Diabetes mellitus   . Gout   .  Hyperlipemia   . Hypertension   . LVH (left ventricular hypertrophy)    12/21/09 Echo -mild asymmetric LVH,EF =>55%  . Morbid obesity ([place])   . OSA (obstructive sleep apnea)   . PAD (peripheral artery disease) ([place])   . Renal artery stenosis ([place]) 12/13/2010   PTCA and Stent - right  . Syncopal episodes   . Thrombocytosis [facility])     Patient  Active Problem List   Diagnosis Date Noted  . Morbid obesity ([place]) 11/02/2016  . Seizures ([place]) 01/19/2016  . Typical absence seizures 03/28/2014  . Sleep apnea with use of continuous positive airway pressure (CPAP) 01/30/2014  . Hyperlipidemia 01/04/2014  . OSA (obstructive sleep apnea) 10/28/2013  . Syncope 07/21/2013  . Type II or unspecified type diabetes mellitus without mention of complication, not stated as uncontrolled 07/20/2013  . Acute renal failure ([place]) 07/20/2013  . History of renal stent 07/20/2013  . Orthostatic hypotension 07/20/2013  . Diastolic CHF ([place]) 16/07/9603  . Essential thrombocythemia ([place]) 04/19/2012  . Hypertension 04/19/2012  . Renal artery stenosis (right) s/p stent 2012 12/13/2010    Past Surgical History:  Procedure Laterality Date  . BREAST CYST EXCISION     S/P Benign Right Breast Cyst Removal.  . BREAST REDUCTION SURGERY  1995   S/P Bilateral breast reduction  . [place]   Times  Two.  Marland Kitchen EXTREMITY CYST EXCISION  1993   left wrist  . PARTIAL HYSTERECTOMY  1994  . renal artery stent placement Right     OB History    No data available       Home Medications    Prior to Admission medications   Medication Sig Start Date End Date Taking? Authorizing Provider  anagrelide ([name]) 1 MG capsule Take 1 capsule (1 mg total) by mouth daily. 10/04/16   Truitt Merle, MD  aspirin EC 81 MG tablet Take 81 mg by mouth daily.    Historical Provider, MD  atorvastatin (LIPITOR) 10 MG tablet Take 1 tablet (10 mg total) by mouth daily at 6 PM. 11/02/16   Mihai Croitoru, MD  B Complex Vitamins (B COMPLEX PO) Take 1 tablet by mouth daily.     Historical Provider, MD  Cholecalciferol (VITAMIN D3) 2000 UNITS capsule Take 2,000 Units by mouth daily.     Historical Provider, MD  furosemide (LASIX) 20 MG tablet Take 20 mg by mouth as needed. Take only as needed for leg edema or weight gain greater than 3 pounds.    Historical Provider, MD    Lacosamide 100 MG TABS Take 1 tablet (100 mg total) by mouth 2 (two) times daily. Patient taking differently: Take by mouth 2 (two) times daily. Taking 50mg  po AM and 100mg  po PM. 08/19/16   [name], MD  OVER THE COUNTER MEDICATION Rem Fresh  Nightly for sleep    Historical Provider, MD  telmisartan (MICARDIS) 20 MG tablet Take 20 mg by mouth 2 (two) times daily.  05/19/15   Historical Provider, MD    Family History Family History  Problem Relation Age of Onset  . Heart failure Mother   . Diabetes Father   . Diabetes Sister   . Hypertension Sister   . Hypertension Sister   . Hypertension Sister   . Breast cancer Maternal Aunt     2 maternal aunts had breast cancer.  . Breast cancer Maternal Grandmother     Social History Social History  Substance Use Topics  . Smoking status:  Never Smoker  . Smokeless tobacco: Never Used  . Alcohol use No     Allergies   Sulfa antibiotics   Review of Systems Review of Systems  Constitutional: Negative for chills and fever.  HENT:       No tongue biting  Eyes: Negative for visual disturbance.  Respiratory: Negative for shortness of breath.   Cardiovascular: Positive for syncope. Negative for chest pain.  Gastrointestinal: Negative for abdominal pain, constipation, diarrhea, nausea and vomiting.  Genitourinary: Negative for difficulty urinating (no incontinence), dysuria and hematuria.  Musculoskeletal: Negative for arthralgias and myalgias.  Skin: Negative for color change.  Allergic/Immunologic: Positive for immunocompromised state (DM2).  Neurological: Positive for seizures. Negative for dizziness, syncope, weakness, light-headedness, numbness and headaches.  Psychiatric/Behavioral: Negative for confusion (transient).   All other systems reviewed and are negative for acute change except as noted in the HPI.    Physical Exam Updated Vital Signs BP (!) 171/91 (BP Location: Left Arm)   Pulse 73   Temp 97.9 F (36.6 C)  (Oral)   Resp 18   Ht 5\' 2"  (1.575 m)   Wt 102.1 kg   SpO2 98%   BMI 41.15 kg/m    Physical Exam  Constitutional: She is oriented to person, place, and time. Vital signs are normal. She appears well-developed and well-nourished.  Non-toxic appearance. No distress.  Afebrile, nontoxic, NAD, mildly hypertensive similar to prior outpatient visits; on my check, BP 165/78.  HENT:  Head: Normocephalic and atraumatic.  Mouth/Throat: Oropharynx is clear and moist and mucous membranes are normal.  Eyes: Conjunctivae and EOM are normal. Pupils are equal, round, and reactive to light. Right eye exhibits no discharge. Left eye exhibits no discharge.  PERRL, EOMI, no nystagmus, no visual field deficits   Neck: Normal range of motion. Neck supple. No spinous process tenderness and no muscular tenderness present. No neck rigidity. Normal range of motion present.  FROM intact without spinous process TTP, no bony stepoffs or deformities, no paraspinous muscle TTP or muscle spasms. No rigidity or meningeal signs. No bruising or swelling.   Cardiovascular: Normal rate, regular rhythm, normal heart sounds and intact distal pulses.  Exam reveals no gallop and no friction rub.   No murmur heard. Pulmonary/Chest: Effort normal and breath sounds normal. No respiratory distress. She has no decreased breath sounds. She has no wheezes. She has no rhonchi. She has no rales.  Abdominal: Soft. Normal appearance and bowel sounds are normal. She exhibits no distension. There is no tenderness. There is no rigidity, no rebound, no guarding, no CVA tenderness, no tenderness at McBurney's point and negative Murphy's sign.  Musculoskeletal: Normal range of motion.  MAE x4 Strength and sensation grossly intact in all extremities Distal pulses intact Gait steady  Neurological: She is alert and oriented to person, place, and time. She has normal strength. No cranial nerve deficit or sensory deficit. Coordination and gait normal.  GCS eye subscore is 4. GCS verbal subscore is 5. GCS motor subscore is 6.  CN 2-12 grossly intact A&O x4 GCS 15 Sensation and strength intact Gait nonataxic including with tandem walking Coordination with finger-to-nose WNL Neg pronator drift   Skin: Skin is warm, dry and intact. No rash noted.  Psychiatric: She has a normal mood and affect.  Nursing note and vitals reviewed.    ED Treatments / Results  Labs (all labs ordered are listed, but only abnormal results are displayed) Labs Reviewed  BASIC METABOLIC PANEL - Abnormal; Notable for the following:  Result Value   Glucose, Bld 138 (*)    Creatinine, Ser 1.18 (*)    GFR calc non Af Amer 48 (*)    GFR calc Af Amer 56 (*)    All other components within normal limits  CBC - Abnormal; Notable for the following:    Platelets 459 (*)    All other components within normal limits    EKG  EKG Interpretation  Date/Time:  Friday Feb 24 2017 16:36:36 EDT Ventricular Rate:  95 PR Interval:    QRS Duration: 78 QT Interval:  343 QTC Calculation: 432 R Axis:   11 Text Interpretation:  Sinus rhythm Probable left atrial enlargement Low voltage, precordial leads no STEMI. NO SIG CHANGE FROM OLD Confirmed by Johnney Killian, MD, Jeannie Done [phone]) on 02/24/2017 4:41:07 PM       Radiology No results found.  Procedures Procedures (including critical care time)  Medications Ordered in ED Medications - No data to display   Initial Impression / Assessment and Plan / ED Course  I have reviewed the triage vital signs and the nursing notes.  Pertinent labs & imaging results that were available during my care of the patient were reviewed by me and considered in my medical decision making (see chart for details).     61 y.o. female here with ?seizure episode earlier today on her way to lunch around noon. States it seems similar to how other seizures have gone, however her friend who was with her isn't here to tell us exactly what happened.  Per the report she got from her friend, there was a period of time when she wasn't really answering her, and was staring off into space. When she "came to" she was initially just slightly confused, but then was fine, felt fine, refused EMS transport. Then decided to get checked out so she came in. Admits she didn't sleep well last night, and this seems to always precipitate the seizures. No symptoms at present, feels fine, and never had any lightheadedness or prodromal symptoms. No LOC. On exam, no focal neuro deficits, BP slightly elevated 165/78 on my exam, similar to prior outpatient visits (asymptomatic, doubt need for emergent work up of this). Labs: CMP with baseline Cr, mildly elevated gluc 138. CBC with stable elevated plt 459, similar to prior. EKG unchanged from prior. Will consult neurology to see what may be the next best step, since the last time she was seen it was mentioned that if she had recurrence of episodes on the dose of vimpat she's on, then may need to reconsider different med option. Will reassess shortly. Discussed case with my attending Dr. Ellender Hose who agrees with plan.   9:24 PM Dr. Krista Blue of Neurology returning page, states that we should try increasing [name] to 50mg  qAM and 150mg  QHS, and follow up with them this week for further management. Advised staying hydrated, getting plenty of rest, eating regular meals, DASH diet and BP med compliance, and f/up with neurologist this week. Advised not to drive until cleared by neurology. I explained the diagnosis and have given explicit precautions to return to the ER including for any other new or worsening symptoms. The patient understands and accepts the medical plan as it's been dictated and I have answered their questions. Discharge instructions concerning home care and prescriptions have been given. The patient is STABLE and is discharged to home in good condition.    Final Clinical Impressions(s) / ED Diagnoses   Final diagnoses:    Typical absence seizure (  [facility])  Essential hypertension    New Prescriptions New Prescriptions   No medications on file     [address] PA-C 02/24/17 2144    Duffy Bruce, MD 02/25/17 1128

## 2017-02-24 NOTE — ED Triage Notes (Signed)
Pt had a syncopal episode right before lunch today. Was able to finish lunch afterwards. Pt reports she feels pretty good right now.

## 2017-02-27 ENCOUNTER — Telehealth: Payer: Self-pay | Admitting: Neurology

## 2017-02-27 NOTE — Telephone Encounter (Signed)
Scheduled patient for 03/01/17 at 2pm with CW,MD

## 2017-02-27 NOTE — Telephone Encounter (Signed)
Pt called because she was admitted to the hospital on Friday and sent home that night.  She was told to schedule an appointment within a week but there is nothing available on Friday.  Please call pt, she states  vimpat increased to 2 full tablets. Pt will be at work # until 4:00 and afterwards she will be avail on home#

## 2017-02-27 NOTE — Telephone Encounter (Signed)
Pt says yes she will check in at 1:30 for the 2:00 appointment

## 2017-02-27 NOTE — Telephone Encounter (Signed)
Called and LVM for pt to call back to schedule appt. If she calls, can offer 03/01/17 at 2pm, check in 130pm.

## 2017-03-01 ENCOUNTER — Ambulatory Visit (INDEPENDENT_AMBULATORY_CARE_PROVIDER_SITE_OTHER): Payer: BC Managed Care – PPO | Admitting: Neurology

## 2017-03-01 ENCOUNTER — Telehealth: Payer: Self-pay | Admitting: Cardiovascular Disease

## 2017-03-01 ENCOUNTER — Encounter: Payer: Self-pay | Admitting: Neurology

## 2017-03-01 ENCOUNTER — Other Ambulatory Visit: Payer: Self-pay | Admitting: Neurology

## 2017-03-01 VITALS — BP 157/87 | HR 95 | Ht 62.0 in | Wt 228.5 lb

## 2017-03-01 DIAGNOSIS — R569 Unspecified convulsions: Secondary | ICD-10-CM | POA: Diagnosis not present

## 2017-03-01 MED ORDER — LACOSAMIDE 100 MG PO TABS: ORAL_TABLET | ORAL | 3 refills | Status: DC

## 2017-03-01 NOTE — Telephone Encounter (Signed)
New message    Pt is calling because she was in the hospital last Friday. She said she abstinence seizure and was diagnosis with a heart murmur. She is asking if the heart murmur could have contributed to her seizure.

## 2017-03-01 NOTE — Telephone Encounter (Signed)
Spoke to patient. She's noted an uptick in her absence seizures. Also states that recently her BP has been running high, 140-180 on top and 90-100 on bottom.  She notes a recent med adjustment - her [name] was changed to 1/2 in AM and 1 1/2 in PM in response to the increase in her seizure activity.  She's asking if there is a correlation between the seizure activity and her BPs or if there is a contributory effect due to her heart murmur. I explained that I am unaware of such a relationship but would inquire as to whether the vascular issues have an effect. She does have a hx of renal artery stenting by Dr. Gwenlyn Found and states she was aware that following this her BPs were supposed to improve - she doesn't feel like this has been the case.  Will route to Dr. Sallyanne Kuster for any advice. Pt voiced appreciation for the call.

## 2017-03-01 NOTE — Telephone Encounter (Signed)
I agree, can't think of a connection between the blood pressure elevation (which is only moderate) and the seizures. Also no clear connection between the murmur and the seizures. Unfortunately renal artery stenting doesn't always leads to a substantial reduction in the blood pressure, but generally does make it easier for the medicines to control the blood pressure.

## 2017-03-01 NOTE — Progress Notes (Signed)
Reason for visit: Seizures  Sara [name] is an 61 y.o. female  History of present illness:  Sara [name] is a 61 year old right handed black female with a history of seizures with partial complex features. The patient went to the emergency room on 02/24/17 following a recurrent seizure. The patient was going to lunch with coworkers when she was noted to go into a staring episode, she was not responding. The patient required from this, but later she went to the emergency room. She is on [name], taking 50 mg in the morning and 100 mg in the evening. She was not able to tolerate higher doses secondary to drowsiness. She also was not able tolerate Keppra in the past because of drowsiness. The patient indicates that she did not sleep well the night prior to the seizure. The patient sustained no injury with the seizure. She comes to this office for an evaluation.  Past Medical History:  Diagnosis Date  . Diabetes mellitus   . Gout   . Hyperlipemia   . Hypertension   . LVH (left ventricular hypertrophy)    12/21/09 Echo -mild asymmetric LVH,EF =>55%  . Morbid obesity ([place])   . OSA (obstructive sleep apnea)   . PAD (peripheral artery disease) ([place])   . Renal artery stenosis ([place]) 12/13/2010   PTCA and Stent - right  . Syncopal episodes   . Thrombocytosis ([place])     Past Surgical History:  Procedure Laterality Date  . BREAST CYST EXCISION     S/P Benign Right Breast Cyst Removal.  . BREAST REDUCTION SURGERY  1995   S/P Bilateral breast reduction  . [place]   Times  Two.  Marland Kitchen EXTREMITY CYST EXCISION  1993   left wrist  . PARTIAL HYSTERECTOMY  1994  . renal artery stent placement Right     Family History  Problem Relation Age of Onset  . Heart failure Mother   . Diabetes Father   . Diabetes Sister   . Hypertension Sister   . Hypertension Sister   . Hypertension Sister   . Breast cancer Maternal Aunt     2 maternal aunts had breast cancer.  . Breast cancer  Maternal Grandmother     Social history:  reports that she has never smoked. She has never used smokeless tobacco. She reports that she does not drink alcohol or use drugs.    Allergies  Allergen Reactions  . Sulfa Antibiotics Itching    Medications:  Prior to Admission medications   Medication Sig Start Date End Date Taking? Authorizing Provider  anagrelide ([name]) 1 MG capsule Take 1 capsule (1 mg total) by mouth daily. 10/04/16  Yes Truitt Merle, MD  aspirin EC 81 MG tablet Take 81 mg by mouth daily.   Yes [provider]  atorvastatin (LIPITOR) 10 MG tablet Take 1 tablet (10 mg total) by mouth daily at 6 PM. 11/02/16  Yes Croitoru, Mihai, MD  B Complex Vitamins (B COMPLEX PO) Take 1 tablet by mouth daily.    Yes [provider]  Cholecalciferol (VITAMIN D3) 2000 UNITS capsule Take 2,000 Units by mouth daily.    Yes [provider]  Lacosamide 100 MG TABS Take 1 tablet (100 mg total) by mouth 2 (two) times daily. Patient taking differently: Take 50-100 mg by mouth 2 (two) times daily. Take 0.5 tablet (50 mg) in the am and Take 1.5 tablet (150mg ) in the evening. 08/19/16  Yes [name],  MD  telmisartan (MICARDIS) 20 MG tablet Take 20 mg by mouth 2 (two) times daily.  05/19/15  Yes [provider]  furosemide (LASIX) 20 MG tablet Take 20 mg by mouth as needed. Take only as needed for leg edema or weight gain greater than 3 pounds.    [provider]    ROS:  Out of a complete 14 system review of symptoms, the patient complains only of the following symptoms, and all other reviewed systems are negative.  Ringing in the ears Cough Seizure  Blood pressure (!) 157/87, pulse 95, height 5\' 2"  (1.575 m), weight 228 lb 8 oz (103.6 kg).  Physical Exam  General: The patient is alert and cooperative at the time of the examination. The patient is markedly obese.  Skin: No significant peripheral edema is noted.   Neurologic  Exam  Mental status: The patient is alert and oriented x 3 at the time of the examination. The patient has apparent normal recent and remote memory, with an apparently normal attention span and concentration ability.   Cranial nerves: Facial symmetry is present. Speech is normal, no aphasia or dysarthria is noted. Extraocular movements are full. Visual fields are full.  Motor: The patient has good strength in all 4 extremities.  Sensory examination: Soft touch sensation is symmetric on the face, arms, and legs.  Coordination: The patient has good finger-nose-finger and heel-to-shin bilaterally.  Gait and station: The patient has a normal gait. Tandem gait is normal. Romberg is negative. No drift is seen.  Reflexes: Deep tendon reflexes are symmetric.   Assessment/Plan:  1. Seizures with partial complex features  2. Sleep apnea on CPAP  The patient will be increased on the [name] taking 50 mg the morning and 150 mg in the evening. She will follow-up for her next scheduled revisit in September 2018, she will contact our office if more episodes occur. The patient may require another medication such as carbamazepine or Lamictal in the future. A prescription was written for the [name].  Jill Alexanders MD 03/01/2017 2:10 PM  [facility] [address] [place], [place] [address]  Phone [phone] Fax [phone]

## 2017-03-02 NOTE — Telephone Encounter (Signed)
Pt aware of Dr. Victorino December recommendations, voiced thanks for call.

## 2017-03-02 NOTE — Telephone Encounter (Signed)
Left msg to call.

## 2017-04-11 ENCOUNTER — Other Ambulatory Visit: Payer: Self-pay | Admitting: Hematology

## 2017-04-11 ENCOUNTER — Other Ambulatory Visit: Payer: Self-pay

## 2017-04-11 NOTE — Telephone Encounter (Signed)
S/w pt that she missed her lab appt. She will call tomorrow to reschedule. This RN did refill her anagrelide # 30, pt is out.   Pt was with sister in ER on Friday and missed her reminder call

## 2017-04-12 ENCOUNTER — Other Ambulatory Visit (HOSPITAL_BASED_OUTPATIENT_CLINIC_OR_DEPARTMENT_OTHER): Payer: BC Managed Care – PPO

## 2017-04-12 DIAGNOSIS — D473 Essential (hemorrhagic) thrombocythemia: Secondary | ICD-10-CM

## 2017-04-12 LAB — CBC WITH DIFFERENTIAL/PLATELET
BASO%: 0.9 % (ref 0.0–2.0)
Basophils Absolute: 0.1 10*3/uL (ref 0.0–0.1)
EOS%: 4.3 % (ref 0.0–7.0)
Eosinophils Absolute: 0.4 10*3/uL (ref 0.0–0.5)
HEMATOCRIT: 40.7 % (ref 34.8–46.6)
HEMOGLOBIN: 13.6 g/dL (ref 11.6–15.9)
LYMPH#: 2.1 10*3/uL (ref 0.9–3.3)
LYMPH%: 24.3 % (ref 14.0–49.7)
MCH: 29.4 pg (ref 25.1–34.0)
MCHC: 33.3 g/dL (ref 31.5–36.0)
MCV: 88.5 fL (ref 79.5–101.0)
MONO#: 0.7 10*3/uL (ref 0.1–0.9)
MONO%: 7.9 % (ref 0.0–14.0)
NEUT%: 62.6 % (ref 38.4–76.8)
NEUTROS ABS: 5.3 10*3/uL (ref 1.5–6.5)
Platelets: 400 10*3/uL (ref 145–400)
RBC: 4.6 10*6/uL (ref 3.70–5.45)
RDW: 13.5 % (ref 11.2–14.5)
WBC: 8.5 10*3/uL (ref 3.9–10.3)

## 2017-04-15 ENCOUNTER — Inpatient Hospital Stay (HOSPITAL_COMMUNITY)
Admission: EM | Admit: 2017-04-15 | Discharge: 2017-04-26 | DRG: 234 | Disposition: A | Discharge status: Home / Self Care | Payer: BC Managed Care – PPO | Attending: Surgery | Admitting: Surgery

## 2017-04-15 ENCOUNTER — Encounter (HOSPITAL_COMMUNITY): Payer: Self-pay | Admitting: Emergency Medicine

## 2017-04-15 ENCOUNTER — Emergency Department (HOSPITAL_COMMUNITY): Payer: BC Managed Care – PPO

## 2017-04-15 DIAGNOSIS — R079 Chest pain, unspecified: Secondary | ICD-10-CM | POA: Diagnosis present

## 2017-04-15 DIAGNOSIS — I214 Non-ST elevation (NSTEMI) myocardial infarction: Principal | ICD-10-CM | POA: Diagnosis present

## 2017-04-15 DIAGNOSIS — Z881 Allergy status to other antibiotic agents status: Secondary | ICD-10-CM

## 2017-04-15 DIAGNOSIS — E785 Hyperlipidemia, unspecified: Secondary | ICD-10-CM | POA: Diagnosis present

## 2017-04-15 DIAGNOSIS — I4891 Unspecified atrial fibrillation: Secondary | ICD-10-CM | POA: Diagnosis not present

## 2017-04-15 DIAGNOSIS — Z7982 Long term (current) use of aspirin: Secondary | ICD-10-CM | POA: Diagnosis not present

## 2017-04-15 DIAGNOSIS — I130 Hypertensive heart and chronic kidney disease with heart failure and stage 1 through stage 4 chronic kidney disease, or unspecified chronic kidney disease: Secondary | ICD-10-CM | POA: Diagnosis present

## 2017-04-15 DIAGNOSIS — Z79899 Other long term (current) drug therapy: Secondary | ICD-10-CM

## 2017-04-15 DIAGNOSIS — I25119 Atherosclerotic heart disease of native coronary artery with unspecified angina pectoris: Secondary | ICD-10-CM | POA: Diagnosis present

## 2017-04-15 DIAGNOSIS — Z9689 Presence of other specified functional implants: Secondary | ICD-10-CM | POA: Diagnosis present

## 2017-04-15 DIAGNOSIS — I10 Essential (primary) hypertension: Secondary | ICD-10-CM | POA: Diagnosis not present

## 2017-04-15 DIAGNOSIS — I252 Old myocardial infarction: Secondary | ICD-10-CM | POA: Diagnosis not present

## 2017-04-15 DIAGNOSIS — E1122 Type 2 diabetes mellitus with diabetic chronic kidney disease: Secondary | ICD-10-CM | POA: Diagnosis present

## 2017-04-15 DIAGNOSIS — D473 Essential (hemorrhagic) thrombocythemia: Secondary | ICD-10-CM | POA: Diagnosis present

## 2017-04-15 DIAGNOSIS — K5900 Constipation, unspecified: Secondary | ICD-10-CM | POA: Diagnosis present

## 2017-04-15 DIAGNOSIS — G4733 Obstructive sleep apnea (adult) (pediatric): Secondary | ICD-10-CM | POA: Diagnosis present

## 2017-04-15 DIAGNOSIS — E119 Type 2 diabetes mellitus without complications: Secondary | ICD-10-CM | POA: Diagnosis not present

## 2017-04-15 DIAGNOSIS — R55 Syncope and collapse: Secondary | ICD-10-CM | POA: Diagnosis present

## 2017-04-15 DIAGNOSIS — E782 Mixed hyperlipidemia: Secondary | ICD-10-CM | POA: Diagnosis not present

## 2017-04-15 DIAGNOSIS — R778 Other specified abnormalities of plasma proteins: Secondary | ICD-10-CM

## 2017-04-15 DIAGNOSIS — I249 Acute ischemic heart disease, unspecified: Secondary | ICD-10-CM | POA: Diagnosis not present

## 2017-04-15 DIAGNOSIS — Z833 Family history of diabetes mellitus: Secondary | ICD-10-CM

## 2017-04-15 DIAGNOSIS — I701 Atherosclerosis of renal artery: Secondary | ICD-10-CM | POA: Diagnosis present

## 2017-04-15 DIAGNOSIS — R7989 Other specified abnormal findings of blood chemistry: Secondary | ICD-10-CM | POA: Diagnosis not present

## 2017-04-15 DIAGNOSIS — Z6841 Body Mass Index (BMI) 40.0 and over, adult: Secondary | ICD-10-CM | POA: Diagnosis not present

## 2017-04-15 DIAGNOSIS — Z8249 Family history of ischemic heart disease and other diseases of the circulatory system: Secondary | ICD-10-CM | POA: Diagnosis not present

## 2017-04-15 DIAGNOSIS — I2510 Atherosclerotic heart disease of native coronary artery without angina pectoris: Secondary | ICD-10-CM | POA: Diagnosis not present

## 2017-04-15 DIAGNOSIS — E1151 Type 2 diabetes mellitus with diabetic peripheral angiopathy without gangrene: Secondary | ICD-10-CM | POA: Diagnosis present

## 2017-04-15 DIAGNOSIS — G40209 Localization-related (focal) (partial) symptomatic epilepsy and epileptic syndromes with complex partial seizures, not intractable, without status epilepticus: Secondary | ICD-10-CM | POA: Diagnosis not present

## 2017-04-15 DIAGNOSIS — E7800 Pure hypercholesterolemia, unspecified: Secondary | ICD-10-CM | POA: Diagnosis present

## 2017-04-15 DIAGNOSIS — R748 Abnormal levels of other serum enzymes: Secondary | ICD-10-CM | POA: Diagnosis present

## 2017-04-15 DIAGNOSIS — D62 Acute posthemorrhagic anemia: Secondary | ICD-10-CM | POA: Diagnosis not present

## 2017-04-15 DIAGNOSIS — Z01810 Encounter for preprocedural cardiovascular examination: Secondary | ICD-10-CM | POA: Diagnosis not present

## 2017-04-15 DIAGNOSIS — N183 Chronic kidney disease, stage 3 (moderate): Secondary | ICD-10-CM | POA: Diagnosis present

## 2017-04-15 DIAGNOSIS — E8809 Other disorders of plasma-protein metabolism, not elsewhere classified: Secondary | ICD-10-CM | POA: Diagnosis present

## 2017-04-15 DIAGNOSIS — Z90710 Acquired absence of both cervix and uterus: Secondary | ICD-10-CM | POA: Diagnosis not present

## 2017-04-15 DIAGNOSIS — M109 Gout, unspecified: Secondary | ICD-10-CM | POA: Diagnosis present

## 2017-04-15 DIAGNOSIS — Z951 Presence of aortocoronary bypass graft: Secondary | ICD-10-CM

## 2017-04-15 DIAGNOSIS — I25110 Atherosclerotic heart disease of native coronary artery with unstable angina pectoris: Secondary | ICD-10-CM | POA: Diagnosis not present

## 2017-04-15 DIAGNOSIS — Z713 Dietary counseling and surveillance: Secondary | ICD-10-CM

## 2017-04-15 HISTORY — DX: Unspecified convulsions: R56.9

## 2017-04-15 LAB — I-STAT TROPONIN, ED
TROPONIN I, POC: 1.03 ng/mL — AB (ref 0.00–0.08)
Troponin i, poc: 0.11 ng/mL (ref 0.00–0.08)

## 2017-04-15 LAB — PROTIME-INR
INR: 1.09
PROTHROMBIN TIME: 14.1 s (ref 11.4–15.2)

## 2017-04-15 LAB — BASIC METABOLIC PANEL
ANION GAP: 9 (ref 5–15)
BUN: 10 mg/dL (ref 6–20)
CALCIUM: 9.1 mg/dL (ref 8.9–10.3)
CHLORIDE: 108 mmol/L (ref 101–111)
CO2: 24 mmol/L (ref 22–32)
Creatinine, Ser: 1.15 mg/dL — ABNORMAL HIGH (ref 0.44–1.00)
GFR calc non Af Amer: 50 mL/min — ABNORMAL LOW (ref >60)
GFR, EST AFRICAN AMERICAN: 58 mL/min — AB (ref >60)
GLUCOSE: 88 mg/dL (ref 65–99)
POTASSIUM: 3.7 mmol/L (ref 3.5–5.1)
Sodium: 141 mmol/L (ref 135–145)

## 2017-04-15 LAB — CBC
HEMATOCRIT: 43.5 % (ref 36.0–46.0)
HEMOGLOBIN: 14.4 g/dL (ref 12.0–15.0)
MCH: 29.4 pg (ref 26.0–34.0)
MCHC: 33.1 g/dL (ref 30.0–36.0)
MCV: 89 fL (ref 78.0–100.0)
Platelets: 413 10*3/uL — ABNORMAL HIGH (ref 150–400)
RBC: 4.89 MIL/uL (ref 3.87–5.11)
RDW: 14.3 % (ref 11.5–15.5)
WBC: 7.9 10*3/uL (ref 4.0–10.5)

## 2017-04-15 LAB — HEPARIN LEVEL (UNFRACTIONATED): Heparin Unfractionated: 1.09 IU/mL — ABNORMAL HIGH (ref 0.30–0.70)

## 2017-04-15 LAB — APTT: aPTT: 139 seconds — ABNORMAL HIGH (ref 24–36)

## 2017-04-15 MED ORDER — PANTOPRAZOLE SODIUM 40 MG IV SOLR
40.0000 mg | Freq: Once | INTRAVENOUS | Status: AC
  Administered 2017-04-15: 40 mg via INTRAVENOUS
  Filled 2017-04-15: qty 40

## 2017-04-15 MED ORDER — ASPIRIN 81 MG PO CHEW
324.0000 mg | CHEWABLE_TABLET | Freq: Once | ORAL | Status: AC
  Administered 2017-04-15: 324 mg via ORAL
  Filled 2017-04-15: qty 4

## 2017-04-15 MED ORDER — METOPROLOL TARTRATE 5 MG/5ML IV SOLN
5.0000 mg | Freq: Once | INTRAVENOUS | Status: AC
  Administered 2017-04-15: 5 mg via INTRAVENOUS
  Filled 2017-04-15: qty 5

## 2017-04-15 MED ORDER — HEPARIN BOLUS VIA INFUSION
4000.0000 [IU] | Freq: Once | INTRAVENOUS | Status: AC
  Administered 2017-04-15: 4000 [IU] via INTRAVENOUS
  Filled 2017-04-15: qty 4000

## 2017-04-15 MED ORDER — HEPARIN (PORCINE) IN NACL 100-0.45 UNIT/ML-% IJ SOLN
1000.0000 [IU]/h | INTRAMUSCULAR | Status: DC
  Administered 2017-04-15 – 2017-04-16 (×2): 1000 [IU]/h via INTRAVENOUS
  Filled 2017-04-15 (×2): qty 250

## 2017-04-15 MED ORDER — NITROGLYCERIN 2 % TD OINT
1.0000 [in_us] | TOPICAL_OINTMENT | Freq: Four times a day (QID) | TRANSDERMAL | Status: DC
  Administered 2017-04-15 – 2017-04-20 (×16): 1 [in_us] via TOPICAL
  Filled 2017-04-15 (×12): qty 30
  Filled 2017-04-15: qty 1
  Filled 2017-04-15 (×7): qty 30
  Filled 2017-04-15: qty 1
  Filled 2017-04-15 (×2): qty 30

## 2017-04-15 NOTE — ED Notes (Signed)
Dr. Olevia Bowens notified of new troponin 1.03. He was also informed that pt still has mid-sternal chest pain 7/10. He stated he will call bed placement for pt to be moved to Stepdown at [facility].

## 2017-04-15 NOTE — ED Notes (Signed)
Attempted blood stick x 2 for Istat troponin. Asked phleb to draw blood.

## 2017-04-15 NOTE — ED Notes (Signed)
Attempted to call report to 2W at [facility]. Informed that room wasn't approved yet and must call back.

## 2017-04-15 NOTE — ED Notes (Signed)
Pt request to use restroom prior to medication administration.

## 2017-04-15 NOTE — H&P (Signed)
History and Physical    [name] UXN:[id number] DOB: [birth year] DOA: 04/15/2017  PCP: [name], MD   Patient coming from: Home.  I have personally briefly reviewed patient's old medical records in [place]  Chief Complaint: Home.  HPI: Sara [name] is a 61 y.o. female with medical history significant of type 2 diabetes, gout, hyperlipidemia, hypertension, LVH, morbid obesity, obstructive sleep apnea, peripheral artery disease, renal artery stenosis (with PTCA and stent on right side), syncopal episodes, thrombocytosis who is coming to the emergency department with complaints of central chest pain, pressure-like, radiated to her left arm, associated with dyspnea, but denies dizziness, diaphoresis, palpitations, recent PND, orthopnea or pitting edema of the lower extremities. She complains that he feels like indigestion and has had mild nausea and heartburn-like discomfort in her epigastric area, but denies emesis, diarrhea, constipation, melena or hematochezia. She denies dysuria, frequency or hematuria.  ED Course: Vital signs on arrival were 97.30F, pulse 87, respirations 18 blood pressure 134/102 3 mmHg and O2 sat 98%. Her initial troponin was 0.11, which rose several hours later to 1.03 ng/mm. Her EKG was normal sinus rhythm with old anterior infarct. WBC 7.9, hemoglobin 14.4 g/dL 7 platelets 413. Her sodium 141, potassium 3.7, chloride 108 and bicarbonate 24 mmol/L. BUN was 10, creatinine 1.15 and glucose 88 mg/dL. The chest radiograph did not show any acute cardiopulmonary pathology.  She was given aspirin, Nitropaste and started on heparin infusion in the emergency department. I gave her metoprolol 5 mg IVP for elevated blood pressure of 182/96 mmHg.  Review of Systems: As per HPI otherwise 10 point review of systems negative.    Past Medical History:  Diagnosis Date  . Diabetes mellitus   . Gout   . Hyperlipemia   . Hypertension   . LVH (left ventricular  hypertrophy)    12/21/09 Echo -mild asymmetric LVH,EF =>55%  . Morbid obesity ([place])   . OSA (obstructive sleep apnea)   . PAD (peripheral artery disease) ([place])   . Renal artery stenosis ([place]) 12/13/2010   PTCA and Stent - right  . Syncopal episodes   . Thrombocytosis ([place])     Past Surgical History:  Procedure Laterality Date  . BREAST CYST EXCISION     S/P Benign Right Breast Cyst Removal.  . BREAST REDUCTION SURGERY  1995   S/P Bilateral breast reduction  . [place]   Times  Two.  Marland Kitchen EXTREMITY CYST EXCISION  1993   left wrist  . PARTIAL HYSTERECTOMY  1994  . renal artery stent placement Right      reports that she has never smoked. She has never used smokeless tobacco. She reports that she does not drink alcohol or use drugs.  Allergies  Allergen Reactions  . Sulfa Antibiotics Itching    Family History  Problem Relation Age of Onset  . Heart failure Mother   . Diabetes Father   . Diabetes Sister   . Hypertension Sister   . Hypertension Sister   . Hypertension Sister   . Breast cancer Maternal Aunt        2 maternal aunts had breast cancer.  . Breast cancer Maternal Grandmother     Prior to Admission medications   Medication Sig Start Date End Date Taking? Authorizing Provider  anagrelide Arsenio Loader) 1 MG capsule take 1 capsule by mouth once daily 04/11/17  Yes Truitt Merle, MD  aspirin EC 81 MG tablet Take 81 mg by mouth daily.  Yes [provider]  atorvastatin (LIPITOR) 10 MG tablet Take 1 tablet (10 mg total) by mouth daily at 6 PM. 11/02/16  Yes Croitoru, Mihai, MD  B Complex Vitamins (B COMPLEX PO) Take 1 tablet by mouth daily.    Yes [provider]  Cholecalciferol (VITAMIN D3) 2000 UNITS capsule Take 2,000 Units by mouth daily.    Yes [provider]  furosemide (LASIX) 20 MG tablet Take 20 mg by mouth as needed. Take only as needed for leg edema or weight gain greater than 3 pounds.   Yes [provider]    lacosamide ([name]) 200 MG TABS tablet Take 100-150 mg by mouth 2 (two) times daily. 100 mg in the AM and 150 mg at bedtime   Yes [provider]  simethicone (MYLICON) 80 MG chewable tablet Chew 80 mg by mouth every 6 (six) hours as needed for flatulence.   Yes [provider]  telmisartan (MICARDIS) 20 MG tablet Take 20 mg by mouth 2 (two) times daily.  05/19/15  Yes [provider]  Lacosamide 100 MG TABS 1/2 tablet in the morning and 1.5 tablets in the evening Patient not taking: Reported on 04/15/2017 03/01/17   [name], MD    Physical Exam: Vitals:   04/15/17 2252 04/15/17 2300 04/15/17 2315 04/15/17 2330  BP: (!) 159/64 (!) 167/76  (!) 173/78  Pulse: 72 82 72 69  Resp: 16 19 (!) 21 19  Temp:      SpO2: 97% 95% 93% 94%  Weight:      Height:        Constitutional: NAD, calm, comfortable es: PERRL, lids and conjunctivae normal ENMT: Mucous membranes are moist. Posterior pharynx clear of any exudate or lesions. Neck: normal, supple, no masses, no thyromegaly Respiratory: clear to auscultation bilaterally, no wheezing, no crackles. Normal respiratory effort. No accessory muscle use.  Cardiovascular: Regular rate and rhythm, no murmurs / rubs / gallops. No extremity edema. 2+ pedal pulses. No carotid bruits.  Abdomen: Soft, no tenderness, no masses palpated. No hepatosplenomegaly. Bowel sounds positive.  Musculoskeletal: no clubbing / cyanosis.  Good ROM, no contractures. Normal muscle tone.  Skin: no rashes, lesions, ulcers on limited skin exam. Neurologic: CN 2-12 grossly intact. Sensation intact, DTR normal. Strength 5/5 in all 4.  Psychiatric: Normal judgment and insight. Alert and oriented x 3. Normal mood.    Labs on Admission: I have personally reviewed following labs and imaging studies  CBC:  Recent Labs Lab 04/12/17 1526 04/15/17 1506  WBC 8.5 7.9  NEUTROABS 5.3  --   HGB 13.6 14.4  HCT 40.7 43.5  MCV 88.5 89.0  PLT 400 413*    Basic Metabolic Panel:  Recent Labs Lab 04/15/17 1624  NA 141  K 3.7  CL 108  CO2 24  GLUCOSE 88  BUN 10  CREATININE 1.15*  CALCIUM 9.1   GFR: Estimated Creatinine Clearance: 56.1 mL/min (A) (by C-G formula based on SCr of 1.15 mg/dL (H)). Liver Function Tests: No results for input(s): AST, ALT, ALKPHOS, BILITOT, PROT, ALBUMIN in the last 168 hours. No results for input(s): LIPASE, AMYLASE in the last 168 hours. No results for input(s): AMMONIA in the last 168 hours. Coagulation Profile:  Recent Labs Lab 04/15/17 1900  INR 1.09   Cardiac Enzymes: No results for input(s): CKTOTAL, CKMB, CKMBINDEX, TROPONINI in the last 168 hours. BNP (last 3 results) No results for input(s): PROBNP in the last 8760 hours. HbA1C: No results for input(s):  HGBA1C in the last 72 hours. CBG: No results for input(s): GLUCAP in the last 168 hours. Lipid Profile: No results for input(s): CHOL, HDL, LDLCALC, TRIG, CHOLHDL, LDLDIRECT in the last 72 hours. Thyroid Function Tests: No results for input(s): TSH, T4TOTAL, FREET4, T3FREE, THYROIDAB in the last 72 hours. Anemia Panel: No results for input(s): VITAMINB12, FOLATE, FERRITIN, TIBC, IRON, RETICCTPCT in the last 72 hours. Urine analysis:    Component Value Date/Time   COLORURINE YELLOW 08/04/2016 1947   APPEARANCEUR CLEAR 08/04/2016 1947   LABSPEC 1.005 08/04/2016 1947   PHURINE 7.0 08/04/2016 1947   GLUCOSEU NEGATIVE 08/04/2016 1947   HGBUR NEGATIVE 08/04/2016 1947   BILIRUBINUR NEGATIVE 08/04/2016 1947   KETONESUR NEGATIVE 08/04/2016 1947   PROTEINUR NEGATIVE 08/04/2016 1947   UROBILINOGEN 0.2 04/14/2014 0119   NITRITE NEGATIVE 08/04/2016 1947   LEUKOCYTESUR NEGATIVE 08/04/2016 1947    Radiological Exams on Admission: Dg Chest 2 View  Result Date: 04/15/2017 CLINICAL DATA:  Chest pain. EXAM: CHEST  2 VIEW COMPARISON:  01/05/2014 FINDINGS: The heart size and pulmonary vascularity are normal and the lungs are clear. Prominent  left pericardial fat pad and/or scarring at the left base, stable. No infiltrates or effusions. No acute bone abnormality. IMPRESSION: No active cardiopulmonary disease. Electronically Signed   By: Lorriane Shire M.D.   On: 04/15/2017 15:40    EKG: Independently reviewed. Vent. rate 88 BPM PR interval * ms QRS duration 78 ms QT/QTc 385/466 ms P-R-T axes 71 26 91 Sinus rhythm Anterior infarct, old  Assessment/Plan Principal Problem:   ACS (acute coronary syndrome) (HCC)   Chest pain Admit to [facility]/stepdown. Continue supplemental oxygen. Continue heparin infusion. Continue aspirin 81 mg by mouth daily. Continue [name] 1 mg by mouth daily. Continue Nitropaste. Start oral metoprolol 12.5 mg by mouth twice a day. Continue atorvastatin 10 mg by mouth daily. Check echocardiogram in a.m. Cardiology will evaluate at [facility]. She will most likely need cardiac catheterization later during this admission.  Active Problems:   Essential thrombocythemia ([place]) Continue aspirin and [name]. She is currently on heparin. Monitor platelet levels.    Hypertension Continue Avapro 75 mg by mouth daily. Start metoprolol 12.5 mg by mouth daily.  Monitor blood pressure, renal function and electrolytes..    Type 2 diabetes mellitus (HCC) Carbohydrate modified diet. Check hemoglobin A1c. CBG monitoring with regular insulin sliding scale while in the hospital.    OSA (obstructive sleep apnea) Continue CPAP at bedtime.    Hyperlipidemia On atorvastatin 10 mg by mouth daily       DVT prophylaxis: Heparin infusion. Code Status: Full code. Family Communication:  Disposition Plan: Admit to [facility] hospital. Cardiology will consult. Consults called: Cardiology (Dr. Eula Fried) Admission status: Inpatient/SDU.   Reubin Milan MD [facility] Pager [phone].  If 7PM-7AM, please contact night-coverage [email or web address] Password [facility]  04/15/2017, 11:53 PM

## 2017-04-15 NOTE — ED Notes (Signed)
Recollect light green tube sent to main lab.

## 2017-04-15 NOTE — ED Provider Notes (Addendum)
[facility] DEPT Provider Note   CSN: [id number] Arrival date & time: 04/15/17  1442     History   Chief Complaint Chief Complaint  Patient presents with  . Chest Pain    HPI Sara [name] is a 61 y.o. female.  HPI Pt has been taking care of her sister who has been in the hospital.  Pt was having some discomfort in her chest over the last week but she was focused on her sister.  She thought it was indigestion.  She would take some antacids and it would resolve.  She had another few episodes or two over the week.  She feels a burning in the center of her chest.    The episodes come and go.  Sometimes it feels like it gets better after she drinks a coke.  This am the pain started again after having breakfast.   Today while she was here in the hospital at around 1430 the pain started again.    The pain resolved but it took around 45 minutes.  No SOB, some nausea.  Her left arm was hurting a little bit today with the pain. No history of heart disease.  Last stress test was almost 10 years ago.  Sometimes when walking fast she feels discomfort in her chest.  Past Medical History:  Diagnosis Date  . Diabetes mellitus   . Gout   . Hyperlipemia   . Hypertension   . LVH (left ventricular hypertrophy)    12/21/09 Echo -mild asymmetric LVH,EF =>55%  . Morbid obesity ([place])   . OSA (obstructive sleep apnea)   . PAD (peripheral artery disease) ([place])   . Renal artery stenosis ([place]) 12/13/2010   PTCA and Stent - right  . Syncopal episodes   . Thrombocytosis [facility])     Patient Active Problem List   Diagnosis Date Noted  . Morbid obesity ([place]) 11/02/2016  . Seizures ([place]) 01/19/2016  . Typical absence seizures 03/28/2014  . Sleep apnea with use of continuous positive airway pressure (CPAP) 01/30/2014  . Hyperlipidemia 01/04/2014  . OSA (obstructive sleep apnea) 10/28/2013  . Syncope 07/21/2013  . Type II or unspecified type diabetes mellitus without mention of complication, not  stated as uncontrolled 07/20/2013  . Acute renal failure ([place]) 07/20/2013  . History of renal stent 07/20/2013  . Orthostatic hypotension 07/20/2013  . Diastolic CHF ([place]) 78/24/2353  . Essential thrombocythemia ([place]) 04/19/2012  . Hypertension 04/19/2012  . Renal artery stenosis (right) s/p stent 2012 12/13/2010    Past Surgical History:  Procedure Laterality Date  . BREAST CYST EXCISION     S/P Benign Right Breast Cyst Removal.  . BREAST REDUCTION SURGERY  1995   S/P Bilateral breast reduction  . [place]   Times  Two.  Marland Kitchen EXTREMITY CYST EXCISION  1993   left wrist  . PARTIAL HYSTERECTOMY  1994  . renal artery stent placement Right     OB History    No data available       Home Medications    Prior to Admission medications   Medication Sig Start Date End Date Taking? Authorizing Provider  anagrelide Arsenio Loader) 1 MG capsule take 1 capsule by mouth once daily 04/11/17  Yes Truitt Merle, MD  aspirin EC 81 MG tablet Take 81 mg by mouth daily.   Yes [provider]  atorvastatin (LIPITOR) 10 MG tablet Take 1 tablet (10 mg total) by mouth daily at 6 PM. 11/02/16  Yes Croitoru, Mihai,  MD  B Complex Vitamins (B COMPLEX PO) Take 1 tablet by mouth daily.    Yes [provider]  Cholecalciferol (VITAMIN D3) 2000 UNITS capsule Take 2,000 Units by mouth daily.    Yes [provider]  furosemide (LASIX) 20 MG tablet Take 20 mg by mouth as needed. Take only as needed for leg edema or weight gain greater than 3 pounds.   Yes [provider]  lacosamide ([name]) 200 MG TABS tablet Take 100-150 mg by mouth 2 (two) times daily. 100 mg in the AM and 150 mg at bedtime   Yes [provider]  simethicone (MYLICON) 80 MG chewable tablet Chew 80 mg by mouth every 6 (six) hours as needed for flatulence.   Yes [provider]  telmisartan (MICARDIS) 20 MG tablet Take 20 mg by mouth 2 (two) times daily.  05/19/15  Yes [provider]  Lacosamide 100 MG TABS 1/2 tablet in the morning and 1.5 tablets in the evening Patient not taking: Reported on 04/15/2017 03/01/17   [name], MD    Family History Family History  Problem Relation Age of Onset  . Heart failure Mother   . Diabetes Father   . Diabetes Sister   . Hypertension Sister   . Hypertension Sister   . Hypertension Sister   . Breast cancer Maternal Aunt        2 maternal aunts had breast cancer.  . Breast cancer Maternal Grandmother     Social History Social History  Substance Use Topics  . Smoking status: Never Smoker  . Smokeless tobacco: Never Used  . Alcohol use No     Allergies   Sulfa antibiotics   Review of Systems Review of Systems  All other systems reviewed and are negative.    Physical Exam Updated Vital Signs BP (!) 163/120 (BP Location: Left Arm)   Pulse 91   Temp 97.8 F (36.6 C)   Resp 17   Ht 1.575 m (5\' 2" )   Wt 99.8 kg (220 lb)   SpO2 98%   BMI 40.24 kg/m   Physical Exam  Constitutional: She appears well-developed and well-nourished. No distress.  HENT:  Head: Normocephalic and atraumatic.  Right Ear: External ear normal.  Left Ear: External ear normal.  Eyes: Conjunctivae are normal. Right eye exhibits no discharge. Left eye exhibits no discharge. No scleral icterus.  Neck: Neck supple. No tracheal deviation present.  Cardiovascular: Normal rate, regular rhythm and intact distal pulses.   Pulmonary/Chest: Effort normal and breath sounds normal. No stridor. No respiratory distress. She has no wheezes. She has no rales.  Abdominal: Soft. Bowel sounds are normal. She exhibits no distension. There is no tenderness. There is no rebound and no guarding.  Musculoskeletal: She exhibits no edema or tenderness.  Neurological: She is alert. She has normal strength. No cranial nerve deficit (no facial droop, extraocular movements intact, no slurred speech) or sensory deficit. She exhibits normal muscle  tone. She displays no seizure activity. Coordination normal.  Skin: Skin is warm and dry. No rash noted.  Psychiatric: She has a normal mood and affect.  Nursing note and vitals reviewed.    ED Treatments / Results  Labs (all labs ordered are listed, but only abnormal results are displayed) Labs Reviewed  CBC - Abnormal; Notable for the following:       Result Value   Platelets 413 (*)    All other components within normal limits  I-STAT TROPOININ, ED -  Abnormal; Notable for the following:    Troponin i, poc 0.11 (*)    All other components within normal limits  BASIC METABOLIC PANEL  PROTIME-INR  APTT  HEPARIN LEVEL (UNFRACTIONATED)  CBC    EKG  EKG Interpretation  Date/Time:  Saturday April 15 2017 14:49:53 EDT Ventricular Rate:  88 PR Interval:    QRS Duration: 78 QT Interval:  385 QTC Calculation: 466 R Axis:   26 Text Interpretation:  Sinus rhythm anterior infarct, old t wave amplitude increased lateral leads Confirmed by Dorie Rank [phone]) on 04/15/2017 3:54:00 PM       Radiology Dg Chest 2 View  Result Date: 04/15/2017 CLINICAL DATA:  Chest pain. EXAM: CHEST  2 VIEW COMPARISON:  01/05/2014 FINDINGS: The heart size and pulmonary vascularity are normal and the lungs are clear. Prominent left pericardial fat pad and/or scarring at the left base, stable. No infiltrates or effusions. No acute bone abnormality. IMPRESSION: No active cardiopulmonary disease. Electronically Signed   By: Lorriane Shire M.D.   On: 04/15/2017 15:40    Procedures Procedures (including critical care time)  Medications Ordered in ED Medications  nitroGLYCERIN (NITROGLYN) 2 % ointment 1 inch (1 inch Topical Given 04/15/17 1639)  heparin ADULT infusion 100 units/mL (25000 units/256mL sodium chloride 0.45%) (1,000 Units/hr Intravenous New Bag/Given 04/15/17 1541)  aspirin chewable tablet 324 mg (324 mg Oral Given 04/15/17 1638)  heparin bolus via infusion 4,000 Units (4,000 Units Intravenous  Bolus from Bag 04/15/17 1639)     Initial Impression / Assessment and Plan / ED Course  I have reviewed the triage vital signs and the nursing notes.  Pertinent labs & imaging results that were available during my care of the patient were reviewed by me and considered in my medical decision making (see chart for details).  Clinical Course as of Apr 15 1730  Sat Apr 15, 2017  1728 Discussed case with Dr Alfredia Ferguson.  BMET is not back yet so not able to admit at this point.  ED will call back to hospitalist service once it results  [JK]  1730 Discussed with Dr Eula Fried, cardiology . Will consult.  Admit to [facility]  [JK]    Clinical Course User Index [JK] Dorie Rank, MD  patient presents to the emergency room for intermittent episodes of chest pain. She thought Her symptoms might have been related to gas. She was trying antacid medications and was drinking carbonated beverages. Symptoms would seem to improve after drinking a soda. She started having pain again today. She was in the hospital visiting a family member so she came to the emergency room. Patient does have several cardiac risk factors. Her troponin is elevated. When I asked her about any exercise induced symptoms patient admits that when she walks briskly she does get some discomfort in her chest.  The symptoms that she describes are somewhat atypical however I am concerned about the possibility of acute coronary syndrome causing her chest pain and  elevated troponin. I have given her an aspirin. I have ordered nitroglycerin to help with her blood pressure and hypertension. I will consult with medical service for admission and further evaluation.  Pt is pain free now.  Final Clinical Impressions(s) / ED Diagnoses   Final diagnoses:  Chest pain, unspecified type  Essential hypertension  Elevated troponin      Dorie Rank, MD 04/15/17 1639    Dorie Rank, MD 04/15/17 1731

## 2017-04-15 NOTE — ED Notes (Signed)
Lab called stating they need a blue tube collected

## 2017-04-15 NOTE — ED Notes (Signed)
ED Provider at bedside. Dr. Ortiz at bedside.  

## 2017-04-15 NOTE — Progress Notes (Signed)
[facility] for IV heparin Indication: chest pain/ACS  Allergies  Allergen Reactions  . Sulfa Antibiotics Itching    Patient Measurements: Height: 5\' 2"  (157.5 cm) Weight: 220 lb (99.8 kg) IBW/kg (Calculated) : 50.1 Heparin Dosing Weight: 74 kg  Vital Signs: Temp: 97.8 F (36.6 C) (06/23 1449) BP: 163/120 (06/23 1509) Pulse Rate: 91 (06/23 1509)  Labs:  Recent Labs  04/15/17 1506  HGB 14.4  HCT 43.5  PLT 413*    CrCl cannot be calculated (Patient's most recent lab result is older than the maximum 21 days allowed.).   Medical History: Past Medical History:  Diagnosis Date  . Diabetes mellitus   . Gout   . Hyperlipemia   . Hypertension   . LVH (left ventricular hypertrophy)    12/21/09 Echo -mild asymmetric LVH,EF =>55%  . Morbid obesity ([place])   . OSA (obstructive sleep apnea)   . PAD (peripheral artery disease) ([place])   . Renal artery stenosis ([place]) 12/13/2010   PTCA and Stent - right  . Syncopal episodes   . Thrombocytosis (HCC)     Medications:   (Not in a hospital admission)  Assessment: 75 yoF with Hx DM, HLD, HTN, OSA, PAD, gout, who presents with chest pain. Troponins elevated x1. ASA, NTG given in ED; pharmacy to dose heparin IV.   Baseline INR, aPTT: pending  Prior anticoagulation: none, on ASA/[name] PTA  Significant events:  Today, 04/15/2017:  CBC: wnl  No bleeding or infusion issues per nursing  CrCl: pending  Goal of Therapy: Heparin level 0.3-0.7 units/ml Monitor platelets by anticoagulation protocol: Yes  Plan:  Heparin 4000 units IV bolus x 1  Heparin 1000 units/hr IV infusion  Check heparin level 6 hrs after start  Daily CBC, daily heparin level once stable  Monitor for signs of bleeding or thrombosis   Reuel Boom, PharmD Pager: [phone] 04/15/2017, 5:15 PM

## 2017-04-15 NOTE — ED Notes (Addendum)
Harrie Foreman, RN attempt to draw blue tube per main lab request; unsuccessful. This Probation officer called main phlebotomy to request lab draw per Artelia Laroche, RN request related to pt difficult stick and has had multiple attempts. Spoke with Caryl Pina from main phlebotomy and confirms will come attempt.

## 2017-04-15 NOTE — ED Notes (Signed)
Notified Hospitalist,Ortiz,MD., pt. I-stat Troponin results 1.03.

## 2017-04-15 NOTE — ED Triage Notes (Signed)
Pt with chest pain in the center of chest for over a week. She thought it was gas bc she took a "gas pill" yesterday. Denies SOB,  N/V/D but reports weakness. She now reports a little bit of pain in the left arm. A/O at triage.

## 2017-04-16 ENCOUNTER — Inpatient Hospital Stay (HOSPITAL_COMMUNITY): Payer: BC Managed Care – PPO

## 2017-04-16 DIAGNOSIS — R7989 Other specified abnormal findings of blood chemistry: Secondary | ICD-10-CM

## 2017-04-16 DIAGNOSIS — E782 Mixed hyperlipidemia: Secondary | ICD-10-CM

## 2017-04-16 DIAGNOSIS — I249 Acute ischemic heart disease, unspecified: Secondary | ICD-10-CM

## 2017-04-16 DIAGNOSIS — I10 Essential (primary) hypertension: Secondary | ICD-10-CM

## 2017-04-16 DIAGNOSIS — D473 Essential (hemorrhagic) thrombocythemia: Secondary | ICD-10-CM

## 2017-04-16 DIAGNOSIS — I214 Non-ST elevation (NSTEMI) myocardial infarction: Secondary | ICD-10-CM

## 2017-04-16 LAB — COMPREHENSIVE METABOLIC PANEL
ALBUMIN: 3.3 g/dL — AB (ref 3.5–5.0)
ALT: 25 U/L (ref 14–54)
AST: 70 U/L — ABNORMAL HIGH (ref 15–41)
Alkaline Phosphatase: 72 U/L (ref 38–126)
Anion gap: 9 (ref 5–15)
BUN: 11 mg/dL (ref 6–20)
CALCIUM: 9.2 mg/dL (ref 8.9–10.3)
CHLORIDE: 105 mmol/L (ref 101–111)
CO2: 27 mmol/L (ref 22–32)
Creatinine, Ser: 1.22 mg/dL — ABNORMAL HIGH (ref 0.44–1.00)
GFR calc Af Amer: 54 mL/min — ABNORMAL LOW (ref >60)
GFR calc non Af Amer: 46 mL/min — ABNORMAL LOW (ref >60)
GLUCOSE: 107 mg/dL — AB (ref 65–99)
Potassium: 3.3 mmol/L — ABNORMAL LOW (ref 3.5–5.1)
SODIUM: 141 mmol/L (ref 135–145)
Total Bilirubin: 0.7 mg/dL (ref 0.3–1.2)
Total Protein: 7 g/dL (ref 6.5–8.1)

## 2017-04-16 LAB — CBC
HEMATOCRIT: 43.2 % (ref 36.0–46.0)
HEMOGLOBIN: 14.2 g/dL (ref 12.0–15.0)
MCH: 29 pg (ref 26.0–34.0)
MCHC: 32.9 g/dL (ref 30.0–36.0)
MCV: 88.2 fL (ref 78.0–100.0)
Platelets: 383 10*3/uL (ref 150–400)
RBC: 4.9 MIL/uL (ref 3.87–5.11)
RDW: 13.3 % (ref 11.5–15.5)
WBC: 8.1 10*3/uL (ref 4.0–10.5)

## 2017-04-16 LAB — ECHOCARDIOGRAM COMPLETE
HEIGHTINCHES: 62 in
Weight: 3520 oz

## 2017-04-16 LAB — GLUCOSE, CAPILLARY
GLUCOSE-CAPILLARY: 106 mg/dL — AB (ref 65–99)
Glucose-Capillary: 133 mg/dL — ABNORMAL HIGH (ref 65–99)
Glucose-Capillary: 149 mg/dL — ABNORMAL HIGH (ref 65–99)

## 2017-04-16 LAB — TROPONIN I
TROPONIN I: 3.83 ng/mL — AB (ref <0.03)
TROPONIN I: 21.64 ng/mL — AB (ref <0.03)
Troponin I: 5.57 ng/mL (ref <0.03)

## 2017-04-16 LAB — PHOSPHORUS: Phosphorus: 3.7 mg/dL (ref 2.5–4.6)

## 2017-04-16 LAB — HIV ANTIBODY (ROUTINE TESTING W REFLEX): HIV Screen 4th Generation wRfx: NONREACTIVE

## 2017-04-16 LAB — HEPARIN LEVEL (UNFRACTIONATED)
Heparin Unfractionated: 0.36 IU/mL (ref 0.30–0.70)
Heparin Unfractionated: 0.44 IU/mL (ref 0.30–0.70)

## 2017-04-16 LAB — MRSA PCR SCREENING: MRSA by PCR: NEGATIVE

## 2017-04-16 LAB — MAGNESIUM: Magnesium: 2.1 mg/dL (ref 1.7–2.4)

## 2017-04-16 MED ORDER — ACETAMINOPHEN 325 MG PO TABS: 650.0000 mg | ORAL_TABLET | Freq: Four times a day (QID) | ORAL | Status: DC | PRN

## 2017-04-16 MED ORDER — METOPROLOL TARTRATE 12.5 MG HALF TABLET: 12.5000 mg | ORAL_TABLET | Freq: Two times a day (BID) | ORAL | Status: DC

## 2017-04-16 MED ORDER — METOPROLOL TARTRATE 5 MG/5ML IV SOLN
5.0000 mg | Freq: Once | INTRAVENOUS | Status: AC
  Administered 2017-04-16: 5 mg via INTRAVENOUS
  Filled 2017-04-16: qty 5

## 2017-04-16 MED ORDER — SODIUM CHLORIDE 0.9 % IV SOLN: 250.0000 mL | INTRAVENOUS | Status: DC | PRN

## 2017-04-16 MED ORDER — SODIUM CHLORIDE 0.9 % WEIGHT BASED INFUSION
1.0000 mL/kg/h | INTRAVENOUS | Status: DC
  Administered 2017-04-17: 1 mL/kg/h via INTRAVENOUS

## 2017-04-16 MED ORDER — SODIUM CHLORIDE 0.9 % WEIGHT BASED INFUSION: 1.0000 mL/kg/h | INTRAVENOUS | Status: DC

## 2017-04-16 MED ORDER — VITAMIN D 1000 UNITS PO TABS
2000.0000 [IU] | ORAL_TABLET | Freq: Every day | ORAL | Status: DC
  Administered 2017-04-16 – 2017-04-20 (×5): 2000 [IU] via ORAL
  Filled 2017-04-16 (×5): qty 2

## 2017-04-16 MED ORDER — ASPIRIN 81 MG PO CHEW
81.0000 mg | CHEWABLE_TABLET | ORAL | Status: AC
  Administered 2017-04-17: 81 mg via ORAL
  Filled 2017-04-16: qty 1

## 2017-04-16 MED ORDER — POTASSIUM CHLORIDE CRYS ER 20 MEQ PO TBCR
40.0000 meq | EXTENDED_RELEASE_TABLET | Freq: Once | ORAL | Status: AC
  Administered 2017-04-16: 40 meq via ORAL
  Filled 2017-04-16: qty 2

## 2017-04-16 MED ORDER — SODIUM CHLORIDE 0.9% FLUSH
3.0000 mL | Freq: Two times a day (BID) | INTRAVENOUS | Status: DC
  Administered 2017-04-17: 3 mL via INTRAVENOUS

## 2017-04-16 MED ORDER — METOPROLOL TARTRATE 25 MG PO TABS
25.0000 mg | ORAL_TABLET | Freq: Two times a day (BID) | ORAL | Status: DC
  Administered 2017-04-16 – 2017-04-20 (×10): 25 mg via ORAL
  Filled 2017-04-16 (×11): qty 1

## 2017-04-16 MED ORDER — SODIUM CHLORIDE 0.9% FLUSH: 3.0000 mL | INTRAVENOUS | Status: DC | PRN

## 2017-04-16 MED ORDER — LACOSAMIDE 50 MG PO TABS
100.0000 mg | ORAL_TABLET | Freq: Every day | ORAL | Status: DC
  Administered 2017-04-16 – 2017-04-18 (×3): 100 mg via ORAL
  Administered 2017-04-19: 50 mg via ORAL
  Administered 2017-04-20: 100 mg via ORAL
  Filled 2017-04-16 (×5): qty 2

## 2017-04-16 MED ORDER — FUROSEMIDE 20 MG PO TABS: 20.0000 mg | ORAL_TABLET | Freq: Every day | ORAL | Status: DC | PRN

## 2017-04-16 MED ORDER — SODIUM CHLORIDE 0.9 % WEIGHT BASED INFUSION
3.0000 mL/kg/h | INTRAVENOUS | Status: DC
  Administered 2017-04-17: 3 mL/kg/h via INTRAVENOUS

## 2017-04-16 MED ORDER — MAGNESIUM SULFATE 2 GM/50ML IV SOLN
2.0000 g | Freq: Once | INTRAVENOUS | Status: AC
  Administered 2017-04-16: 2 g via INTRAVENOUS
  Filled 2017-04-16: qty 50

## 2017-04-16 MED ORDER — LACOSAMIDE 50 MG PO TABS
150.0000 mg | ORAL_TABLET | Freq: Every day | ORAL | Status: DC
  Administered 2017-04-16 – 2017-04-20 (×6): 150 mg via ORAL
  Filled 2017-04-16 (×6): qty 3

## 2017-04-16 MED ORDER — SIMETHICONE 80 MG PO CHEW: 80.0000 mg | CHEWABLE_TABLET | Freq: Four times a day (QID) | ORAL | Status: DC | PRN

## 2017-04-16 MED ORDER — SODIUM CHLORIDE 0.9% FLUSH
3.0000 mL | Freq: Two times a day (BID) | INTRAVENOUS | Status: DC
  Administered 2017-04-16 – 2017-04-17 (×2): 3 mL via INTRAVENOUS

## 2017-04-16 MED ORDER — SODIUM CHLORIDE 0.9 % WEIGHT BASED INFUSION: 3.0000 mL/kg/h | INTRAVENOUS | Status: DC

## 2017-04-16 MED ORDER — ASPIRIN EC 81 MG PO TBEC
81.0000 mg | DELAYED_RELEASE_TABLET | Freq: Every day | ORAL | Status: DC
  Administered 2017-04-16 – 2017-04-20 (×4): 81 mg via ORAL
  Filled 2017-04-16 (×4): qty 1

## 2017-04-16 MED ORDER — ALPRAZOLAM 0.5 MG PO TABS: 0.5000 mg | ORAL_TABLET | Freq: Every evening | ORAL | Status: DC | PRN

## 2017-04-16 MED ORDER — ASPIRIN 81 MG PO CHEW: 81.0000 mg | CHEWABLE_TABLET | ORAL | Status: AC

## 2017-04-16 MED ORDER — INSULIN ASPART 100 UNIT/ML ~~LOC~~ SOLN
0.0000 [IU] | Freq: Three times a day (TID) | SUBCUTANEOUS | Status: DC
  Administered 2017-04-16: 1 [IU] via SUBCUTANEOUS

## 2017-04-16 MED ORDER — IRBESARTAN 75 MG PO TABS
75.0000 mg | ORAL_TABLET | Freq: Every day | ORAL | Status: DC
  Administered 2017-04-16: 75 mg via ORAL
  Filled 2017-04-16: qty 1

## 2017-04-16 MED ORDER — ANAGRELIDE HCL 0.5 MG PO CAPS
1.0000 mg | ORAL_CAPSULE | Freq: Every day | ORAL | Status: DC
  Administered 2017-04-16 – 2017-04-20 (×5): 1 mg via ORAL
  Filled 2017-04-16 (×6): qty 2

## 2017-04-16 MED ORDER — ATORVASTATIN CALCIUM 10 MG PO TABS
10.0000 mg | ORAL_TABLET | Freq: Every day | ORAL | Status: DC
  Administered 2017-04-16 – 2017-04-20 (×5): 10 mg via ORAL
  Filled 2017-04-16 (×5): qty 1

## 2017-04-16 MED ORDER — LACOSAMIDE 50 MG PO TABS: 100.0000 mg | ORAL_TABLET | Freq: Two times a day (BID) | ORAL | Status: DC

## 2017-04-16 MED ORDER — ONDANSETRON HCL 4 MG/2ML IJ SOLN: 4.0000 mg | Freq: Four times a day (QID) | INTRAMUSCULAR | Status: DC | PRN

## 2017-04-16 NOTE — Consult Note (Addendum)
CARDIOLOGY INPATIENT CONSULTATION NOTE  Patient ID: Sara [name] MRN: [id number], DOB/AGE: January 12, 1956   Admit date: 04/15/2017   Primary Physician: [name], MD Primary Cardiologist: Albertine Patricia MD  Reason for Consult:   Chest pain   Requesting Physician: Tennis Must MD  HPI: This is a 61 y.o. female with no known history of CAD but has known history of DM, LVH, HTN, OSA 2/2 obesity, renal artery stenosis, thrombocytosis presented with substernal chest pain.  She describes the CP as pressure like, radiated to left arm, started yesterday morning. Had similar episodes in the last 1 week. She also has sometimes feeling like indigestion which does not get relieved with antacids. She had some mild nausea with it but no vomiting. She denied diarrhea, gastroenteritis symptoms, PE/DVT in the past, prolonged travel, hemoptysis, wheezing, SOB, pneumonia symptoms, bleeding issues.   Problem List: Past Medical History:  Diagnosis Date  . Diabetes mellitus   . Gout   . Hyperlipemia   . Hypertension   . LVH (left ventricular hypertrophy)    12/21/09 Echo -mild asymmetric LVH,EF =>55%  . Morbid obesity ([place])   . OSA (obstructive sleep apnea)   . PAD (peripheral artery disease) ([place])   . Renal artery stenosis ([place]) 12/13/2010   PTCA and Stent - right  . Syncopal episodes   . Thrombocytosis ([place])     Past Surgical History:  Procedure Laterality Date  . BREAST CYST EXCISION     S/P Benign Right Breast Cyst Removal.  . BREAST REDUCTION SURGERY  1995   S/P Bilateral breast reduction  . [place]   Times  Two.  Marland Kitchen EXTREMITY CYST EXCISION  1993   left wrist  . PARTIAL HYSTERECTOMY  1994  . renal artery stent placement Right      Allergies:  Allergies  Allergen Reactions  . Sulfa Antibiotics Itching     Home Medications Current Facility-Administered Medications  Medication Dose Route Frequency Provider Last Rate Last Dose  . acetaminophen  (TYLENOL) tablet 650 mg  650 mg Oral Q6H PRN Reubin Milan, MD      . ALPRAZolam Duanne Moron) tablet 0.5 mg  0.5 mg Oral QHS PRN Reubin Milan, MD      . anagrelide Arsenio Loader) capsule 1 mg  1 mg Oral Daily Reubin Milan, MD      . aspirin EC tablet 81 mg  81 mg Oral Daily Reubin Milan, MD      . atorvastatin (LIPITOR) tablet 10 mg  10 mg Oral q1800 Reubin Milan, MD      . cholecalciferol (VITAMIN D) tablet 2,000 Units  2,000 Units Oral Daily Reubin Milan, MD      . furosemide (LASIX) tablet 20 mg  20 mg Oral Daily PRN Reubin Milan, MD      . heparin ADULT infusion 100 units/mL (25000 units/235mL sodium chloride 0.45%)  1,000 Units/hr Intravenous Continuous Polly Cobia, RPH 10 mL/hr at 04/15/17 1541 1,000 Units/hr at 04/15/17 1541  . insulin aspart (novoLOG) injection 0-9 Units  0-9 Units Subcutaneous TID WC Reubin Milan, MD      . irbesartan [facility]) tablet 75 mg  75 mg Oral Daily Reubin Milan, MD      . lacosamide ([name]) tablet 100 mg  100 mg Oral Daily Reubin Milan, MD       And  . lacosamide ([name]) tablet 150 mg  150 mg Oral QHS Reubin Milan, MD  150 mg at 04/16/17 0300  . magnesium sulfate IVPB 2 g 50 mL  2 g Intravenous Once Reubin Milan, MD   Stopped at 04/16/17 [phone]  . metoprolol tartrate (LOPRESSOR) tablet 12.5 mg  12.5 mg Oral BID Reubin Milan, MD      . nitroGLYCERIN (NITROGLYN) 2 % ointment 1 inch  1 inch Topical Q6H Dorie Rank, MD   1 inch at 04/16/17 0000  . ondansetron (ZOFRAN) injection 4 mg  4 mg Intravenous Q6H PRN Reubin Milan, MD      . simethicone [facility]) chewable tablet 80 mg  80 mg Oral Q6H PRN Reubin Milan, MD         Family History  Problem Relation Age of Onset  . Heart failure Mother   . Diabetes Father   . Diabetes Sister   . Hypertension Sister   . Hypertension Sister   . Hypertension Sister   . Breast cancer Maternal Aunt        2 maternal aunts had  breast cancer.  . Breast cancer Maternal Grandmother      Social History   Social History  . Marital status: Married    Spouse name: Juanda Crumble  . Number of children: 2  . Years of education: 16   Occupational History  . [employer]. [place] History Main Topics  . Smoking status: Never Smoker  . Smokeless tobacco: Never Used  . Alcohol use No  . Drug use: No  . Sexual activity: Yes   Other Topics Concern  . Not on file   Social History Narrative   Patient lives at husband and her son, home with family.   Patient has two adult children.   Patient is not drinking any caffeine.   Patient is working full-time.   Patient has a college education.   Patient is right-handed.     Review of Systems: General: fatigue with chest pain  Cardiovascular: chest pain, syncope Dermatological: negative for rash Respiratory: negative for wheezing  Urologic: negative for hematuria Abdominal: nausea negative for vomiting, diarrhea, bright red blood per rectum, melena, or hematemesis Neurologic: negative for visual changes, syncope, or dizziness Hematology: mild anemia Psychiatry: non suicidal/homicidal  Musculoskeletal: back pain   Physical Exam: Vitals: BP (!) 153/106 (BP Location: Left Arm)   Pulse 83   Temp 98.2 F (36.8 C) (Oral)   Resp (!) 23   Ht 5\' 2"  (1.575 m)   Wt 99.8 kg (220 lb)   SpO2 98%   BMI 40.24 kg/m  General: not in acute distress Neck: JVP flat, neck supple Heart: regular rate and rhythm, S1, S2, no murmurs  Lungs: CTAB  GI: non tender, non distended, bowel sounds present Extremities: no edema Neuro: AAO x 3  Psych: normal affect, no anxiety   Labs:   Results for orders placed or performed during the hospital encounter of 04/15/17 (from the past 24 hour(s))  CBC     Status: Abnormal   Collection Time: 04/15/17  3:06 PM  Result Value Ref Range   WBC 7.9 4.0 - 10.5 K/uL   RBC 4.89 3.87 - 5.11 MIL/uL   Hemoglobin 14.4 12.0  - 15.0 g/dL   HCT 43.5 36.0 - 46.0 %   MCV 89.0 78.0 - 100.0 fL   MCH 29.4 26.0 - 34.0 pg   MCHC 33.1 30.0 - 36.0 g/dL   RDW 14.3 11.5 - 15.5 %   Platelets 413 (H) 150 - 400 K/uL  I-stat troponin, ED     Status: Abnormal   Collection Time: 04/15/17  3:17 PM  Result Value Ref Range   Troponin i, poc 0.11 (HH) 0.00 - 0.08 ng/mL   Comment NOTIFIED PHYSICIAN    Comment 3          Basic metabolic panel     Status: Abnormal   Collection Time: 04/15/17  4:24 PM  Result Value Ref Range   Sodium 141 135 - 145 mmol/L   Potassium 3.7 3.5 - 5.1 mmol/L   Chloride 108 101 - 111 mmol/L   CO2 24 22 - 32 mmol/L   Glucose, Bld 88 65 - 99 mg/dL   BUN 10 6 - 20 mg/dL   Creatinine, Ser 1.15 (H) 0.44 - 1.00 mg/dL   Calcium 9.1 8.9 - 10.3 mg/dL   GFR calc non Af Amer 50 (L) >60 mL/min   GFR calc Af Amer 58 (L) >60 mL/min   Anion gap 9 5 - 15  Heparin level (unfractionated)     Status: Abnormal   Collection Time: 04/15/17  7:00 PM  Result Value Ref Range   Heparin Unfractionated 1.09 (H) 0.30 - 0.70 IU/mL  Protime-INR     Status: None   Collection Time: 04/15/17  7:00 PM  Result Value Ref Range   Prothrombin Time 14.1 11.4 - 15.2 seconds   INR 1.09   APTT     Status: Abnormal   Collection Time: 04/15/17  7:00 PM  Result Value Ref Range   aPTT 139 (H) 24 - 36 seconds  I-stat troponin, ED     Status: Abnormal   Collection Time: 04/15/17 10:37 PM  Result Value Ref Range   Troponin i, poc 1.03 (HH) 0.00 - 0.08 ng/mL   Comment 3          MRSA PCR Screening     Status: None   Collection Time: 04/16/17  1:43 AM  Result Value Ref Range   MRSA by PCR NEGATIVE NEGATIVE  Troponin I     Status: Abnormal   Collection Time: 04/16/17  2:12 AM  Result Value Ref Range   Troponin I 3.83 (HH) <0.03 ng/mL  Comprehensive metabolic panel     Status: Abnormal   Collection Time: 04/16/17  2:12 AM  Result Value Ref Range   Sodium 141 135 - 145 mmol/L   Potassium 3.3 (L) 3.5 - 5.1 mmol/L   Chloride 105 101 -  111 mmol/L   CO2 27 22 - 32 mmol/L   Glucose, Bld 107 (H) 65 - 99 mg/dL   BUN 11 6 - 20 mg/dL   Creatinine, Ser 1.22 (H) 0.44 - 1.00 mg/dL   Calcium 9.2 8.9 - 10.3 mg/dL   Total Protein 7.0 6.5 - 8.1 g/dL   Albumin 3.3 (L) 3.5 - 5.0 g/dL   AST 70 (H) 15 - 41 U/L   ALT 25 14 - 54 U/L   Alkaline Phosphatase 72 38 - 126 U/L   Total Bilirubin 0.7 0.3 - 1.2 mg/dL   GFR calc non Af Amer 46 (L) >60 mL/min   GFR calc Af Amer 54 (L) >60 mL/min   Anion gap 9 5 - 15  CBC     Status: None   Collection Time: 04/16/17  2:12 AM  Result Value Ref Range   WBC 8.1 4.0 - 10.5 K/uL   RBC 4.90 3.87 - 5.11 MIL/uL   Hemoglobin 14.2 12.0 - 15.0 g/dL   HCT 43.2 36.0 - 46.0 %  MCV 88.2 78.0 - 100.0 fL   MCH 29.0 26.0 - 34.0 pg   MCHC 32.9 30.0 - 36.0 g/dL   RDW 13.3 11.5 - 15.5 %   Platelets 383 150 - 400 K/uL  Heparin level (unfractionated)     Status: None   Collection Time: 04/16/17  2:12 AM  Result Value Ref Range   Heparin Unfractionated 0.36 0.30 - 0.70 IU/mL     Radiology/Studies: Dg Chest 2 View  Result Date: 04/15/2017 CLINICAL DATA:  Chest pain. EXAM: CHEST  2 VIEW COMPARISON:  01/05/2014 FINDINGS: The heart size and pulmonary vascularity are normal and the lungs are clear. Prominent left pericardial fat pad and/or scarring at the left base, stable. No infiltrates or effusions. No acute bone abnormality. IMPRESSION: No active cardiopulmonary disease. Electronically Signed   By: Lorriane Shire M.D.   On: 04/15/2017 15:40    EKG: normal sinus rhythm, anteroseptal infarct with lateral non specific T wave inversion  Echo: 07/22/2013 Normal LVEF, LVH, no valvular dz  Cardiac cath: none  Medical decision making:  Discussed care with the patient Discussed care with the physician on the phone Reviewed labs and imaging personally Reviewed prior records  ASSESSMENT AND PLAN:  This is a 61 y.o. female with no known history of CAD but risk factors for CAD presented with typical chest pain and  elevated troponin. Currently CP free, will need to have invasive strategy to treat NSTEMI and control risk factors especially hypertension.    Principal Problem:   ACS (acute coronary syndrome) (HCC) Active Problems:   Essential thrombocythemia ([place])   Hypertension   Type 2 diabetes mellitus (HCC)   OSA (obstructive sleep apnea)   Hyperlipidemia   Chest pain  NSTEMI Cycle troponin, serial EKGs prn, lipid panel, TSH, HbA1c, echocardiogram in the AM IV heparin, aspirin, high dose statin (lipitor 80 mg daily), Consider cardiac catheterization on Monday, so keep patient NPO post midnight on Sunday Keep platelets <500   Hypertension, essential uncontrolled on arrival  Continue home blood pressure meds Goal <130/90  Hyperlipidemia unknown type - check lipid panel  Type 2 diabetes mellitus with peripheral arterial disease (renal artery stenosis) - per primary team  Renal artery stenosis s/p PTCA and BMS stent in the right renal artery - bp still uncontrolled, need appropriate management    Signed, Flossie Dibble, MD MS 04/16/2017, 6:23 AM   Attending Addendum:  History and all data above reviewed.  Patient examined.  I agree with the findings as above.   All available labs, radiology testing, previous records reviewed. Agree with documented assessment and plan. Sara [name] is a 49F with diabetes, hypertension, OSA and renal artery stenosis s/p stenting here with NSTEMI.  She reports several days of indigestion that she attributed to stress and gas.  Symptoms improved with drinking sodas.  She finally presented to the ED due to persistent symptoms and troponin I was elevated to 3.83.  Her symptoms have improved with the initiation of aspirin and heparin.  EKG shows anterior Q waves new since 07/2016.  Echocardiogram is pending.  She will go for [facility] tomorrow.  There is no evidence of heart failure on exam.     Etta Gassett C. Oval Linsey, MD, [facility]  04/16/2017 8:19 AM

## 2017-04-16 NOTE — Progress Notes (Signed)
[facility] Progress Note  Patient: Sara [name] PNT:[id number]   PCP: [name], MD DOB: [birth year]   DOA: 04/15/2017   DOS: 04/16/2017   Date of Service: the patient was seen and examined on 04/16/2017  Subjective: No further chest pain in the hospital. No nausea no vomiting. No shortness of breath no dizziness or lightheadedness.  Brief hospital course: Pt. with PMH of essential thrombocytosis, type II DM, gout, dyslipidemia, hypertension, OSA, PAD, renal artery stenosis with PTCA on right side; admitted on 04/15/2017, presented with complaint of chest pain, was found to have non-STEMI. Currently further plan is cardiac catheterization on Monday.  Assessment and Plan: 1. Non-STEMI. Admitted to hospitalist service on telemetry. Troponin consistently elevated. Cardiology consulted. Patient is asymptomatic and therefore will continue to closely monitor. EF 45% with define hypokinesis. Continue nitroglycerin, continue IV heparin. Aspirin 81 mg per cardiology. Monitor. Patient's [name] is associated with angina as well as cardiotoxicity. Unclear whether this is the reason but may need adjustment under treatment based on findings of cardiac cath.  2. Essential thrombocythemia. Continue aspirin, continue [name] [name] is associated with angina as well as cardiac toxicity. May require treatment alteration. Hypertension Continue Avapro 75 mg by mouth daily. Start metoprolol 12.5 mg by mouth daily.  Monitor blood pressure, renal function and electrolytes..    Type 2 diabetes mellitus (HCC) Carbohydrate modified diet. Check hemoglobin A1c. CBG monitoring with regular insulin sliding scale while in the hospital.    OSA (obstructive sleep apnea) Continue CPAP at bedtime.    Hyperlipidemia On atorvastatin 10 mg by mouth daily    Diet: cardiac die DVT Prophylaxis: heparin  Advance goals of care discussion: full code  Family Communication: no family was present at  bedside, at the time of interview.  Disposition:  Discharge to home.  Consultants: cardiology Procedures: Echocardiogram   Antibiotics: Anti-infectives    None       Objective: Physical Exam: Vitals:   04/16/17 0500 04/16/17 0755 04/16/17 1105 04/16/17 1652  BP: (!) 153/106 (!) 155/59 (!) 157/99 130/77  Pulse: 83 93 (!) 105 98  Resp: (!) 23 17 (!) 26 20  Temp: 98.2 F (36.8 C) 98.1 F (36.7 C) 98 F (36.7 C) 97.9 F (36.6 C)  TempSrc: Oral Oral Oral Oral  SpO2: 98% 98% 96% 96%  Weight:      Height:        Intake/Output Summary (Last 24 hours) at 04/16/17 1810 Last data filed at 04/16/17 0900  Gross per 24 hour  Intake           677.17 ml  Output                0 ml  Net           677.17 ml   Filed Weights   04/15/17 1450  Weight: 99.8 kg (220 lb)   General: Alert, Awake and Oriented to Time, Place and Person. Appear in mild distress, affect appropriate Eyes: PERRL, Conjunctiva normal ENT: Oral Mucosa clear moist. Neck: no JVD, no Abnormal Mass Or lumps Cardiovascular: S1 and S2 Present, no Murmur, Peripheral Pulses Present Respiratory: normal respiratory effort, Bilateral Air entry equal and Decreased, no use of accessory muscle, Clear to Auscultation, no Crackles, no wheezes Abdomen: Bowel Sound present, Soft and no tenderness, no hernia Skin: no redness, no Rash, no induration Extremities: no Pedal edema, no calf tenderness Neurologic: Grossly no focal neuro deficit. Bilaterally Equal motor strength  Data Reviewed: CBC:  Recent Labs Lab  04/12/17 1526 04/15/17 1506 04/16/17 0212  WBC 8.5 7.9 8.1  NEUTROABS 5.3  --   --   HGB 13.6 14.4 14.2  HCT 40.7 43.5 43.2  MCV 88.5 89.0 88.2  PLT 400 413* 161   Basic Metabolic Panel:  Recent Labs Lab 04/15/17 1624 04/16/17 0212 04/16/17 0600  NA 141 141  --   K 3.7 3.3*  --   CL 108 105  --   CO2 24 27  --   GLUCOSE 88 107*  --   BUN 10 11  --   CREATININE 1.15* 1.22*  --   CALCIUM 9.1 9.2  --     MG  --   --  2.1  PHOS  --   --  3.7    Liver Function Tests:  Recent Labs Lab 04/16/17 0212  AST 70*  ALT 25  ALKPHOS 72  BILITOT 0.7  PROT 7.0  ALBUMIN 3.3*   No results for input(s): LIPASE, AMYLASE in the last 168 hours. No results for input(s): AMMONIA in the last 168 hours. Coagulation Profile:  Recent Labs Lab 04/15/17 1900  INR 1.09   Cardiac Enzymes:  Recent Labs Lab 04/16/17 0212 04/16/17 0937 04/16/17 1604  TROPONINI 3.83* 5.57* 21.64*   BNP (last 3 results) No results for input(s): PROBNP in the last 8760 hours. CBG:  Recent Labs Lab 04/16/17 0840 04/16/17 1104 04/16/17 1655  GLUCAP 133* 149* 106*   Studies: No results found.  Scheduled Meds: . anagrelide  1 mg Oral Daily  . [START ON 04/17/2017] aspirin  81 mg Oral Pre-Cath  . aspirin EC  81 mg Oral Daily  . atorvastatin  10 mg Oral q1800  . cholecalciferol  2,000 Units Oral Daily  . insulin aspart  0-9 Units Subcutaneous TID WC  . irbesartan  75 mg Oral Daily  . lacosamide  100 mg Oral Daily   And  . lacosamide  150 mg Oral QHS  . metoprolol tartrate  25 mg Oral BID  . nitroGLYCERIN  1 inch Topical Q6H  . sodium chloride flush  3 mL Intravenous Q12H   Continuous Infusions: . sodium chloride    . [START ON 04/17/2017] sodium chloride     Followed by  . [START ON 04/17/2017] sodium chloride    . heparin 1,000 Units/hr (04/16/17 1028)   PRN Meds: sodium chloride, acetaminophen, ALPRAZolam, ondansetron (ZOFRAN) IV, simethicone, sodium chloride flush  Time spent: 35 minutes  Author: Berle Mull, MD [facility] Pager: [phone] 04/16/2017 6:10 PM  If 7PM-7AM, please contact night-coverage at [email or web address], password [facility]

## 2017-04-16 NOTE — Progress Notes (Signed)
Seen by Fellow this am SSCP with troponin 3.8 No chest pain this am ECG not acute On heparin Discussed cath and she is willing to proceed Add beta blocker for tachycardia Orders written placed on board  Jenkins Rouge

## 2017-04-16 NOTE — ED Notes (Signed)
Pt ambulatory to restroom

## 2017-04-16 NOTE — Progress Notes (Signed)
Notified MD of critical troponin 3.38

## 2017-04-16 NOTE — Progress Notes (Signed)
Received patient from [facility], assessment completed, VS documented, oriented patient to the room.  CHG bath completed, MRSA swab sent, Will continue to monitor.

## 2017-04-16 NOTE — Progress Notes (Signed)
Auto CPAP set up for pt with nasal mask. Pt unable to tolerate mask at this time. RT advised pt to have someone bring home mask if she would like to try CPAP another night. RT will continue to monitor.

## 2017-04-16 NOTE — Progress Notes (Signed)
ANTICOAGULATION CONSULT NOTE - Follow Up Consult  Pharmacy Consult for Heparin  Indication: chest pain/ACS  Allergies  Allergen Reactions  . Sulfa Antibiotics Itching    Patient Measurements: Height: 5\' 2"  (157.5 cm) Weight: 220 lb (99.8 kg) IBW/kg (Calculated) : 50.1  Vital Signs: Temp: 98.1 F (36.7 C) (06/24 0755) Temp Source: Oral (06/24 0755) BP: 155/59 (06/24 0755) Pulse Rate: 93 (06/24 0755)  Labs:  Recent Labs  04/15/17 1506 04/15/17 1624 04/15/17 1900 04/16/17 0212 04/16/17 0937  HGB 14.4  --   --  14.2  --   HCT 43.5  --   --  43.2  --   PLT 413*  --   --  383  --   APTT  --   --  139*  --   --   LABPROT  --   --  14.1  --   --   INR  --   --  1.09  --   --   HEPARINUNFRC  --   --  1.09* 0.36 0.44  CREATININE  --  1.15*  --  1.22*  --   TROPONINI  --   --   --  3.83* 5.57*    Estimated Creatinine Clearance: 52.8 mL/min (A) (by C-G formula based on SCr of 1.22 mg/dL (H)).   Assessment: Heparin for CP/elevated troponin. Not an anticoagulation PTA. Noted to be on anagrelide for thrombocythemia. Will need to this closely when used with anticoagulation/antiplatelets for increased risk of bleed.  Confirmatory heparin level therapeutic today at 0.44. Platelet count hemoglobin all within normal limits this morning without notes of bleeding.  Goal of Therapy:  Heparin level 0.3-0.7 units/ml Monitor platelets by anticoagulation protocol: Yes   Plan:  -Cont heparin 1000 units/hr -Daily CBC/heparin level -Plans for cath Irineo Axon, PharmD Acute Care Pharmacy Resident  Pager: [phone] 04/16/2017

## 2017-04-16 NOTE — Progress Notes (Signed)
ANTICOAGULATION CONSULT NOTE - Follow Up Consult  Pharmacy Consult for Heparin  Indication: chest pain/ACS  Allergies  Allergen Reactions  . Sulfa Antibiotics Itching    Patient Measurements: Height: 5\' 2"  (157.5 cm) Weight: 220 lb (99.8 kg) IBW/kg (Calculated) : 50.1  Vital Signs: Temp: 97.6 F (36.4 C) (06/24 0123) Temp Source: Oral (06/24 0123) BP: 166/98 (06/24 0123) Pulse Rate: 72 (06/24 0123)  Labs:  Recent Labs  04/15/17 1506 04/15/17 1624 04/15/17 1900 04/16/17 0212  HGB 14.4  --   --  14.2  HCT 43.5  --   --  43.2  PLT 413*  --   --  383  APTT  --   --  139*  --   LABPROT  --   --  14.1  --   INR  --   --  1.09  --   HEPARINUNFRC  --   --  1.09* 0.36  CREATININE  --  1.15*  --   --     Estimated Creatinine Clearance: 56.1 mL/min (A) (by C-G formula based on SCr of 1.15 mg/dL (H)).   Assessment: Heparin for CP/elevated troponin, heparin level therapeutic x 1  Goal of Therapy:  Heparin level 0.3-0.7 units/ml Monitor platelets by anticoagulation protocol: Yes   Plan:  -Cont heparin 1000 units/hr -1000 HL  Narda Bonds 04/16/2017,3:49 AM

## 2017-04-17 ENCOUNTER — Encounter (HOSPITAL_COMMUNITY): Payer: Self-pay | Admitting: Interventional Cardiology

## 2017-04-17 ENCOUNTER — Other Ambulatory Visit: Payer: Self-pay | Admitting: *Deleted

## 2017-04-17 ENCOUNTER — Encounter (HOSPITAL_COMMUNITY)
Admission: EM | Disposition: A | Discharge status: Home / Self Care | Payer: Self-pay | Source: Home / Self Care | Attending: Surgery

## 2017-04-17 DIAGNOSIS — R778 Other specified abnormalities of plasma proteins: Secondary | ICD-10-CM

## 2017-04-17 DIAGNOSIS — R7989 Other specified abnormal findings of blood chemistry: Secondary | ICD-10-CM

## 2017-04-17 DIAGNOSIS — I2510 Atherosclerotic heart disease of native coronary artery without angina pectoris: Secondary | ICD-10-CM

## 2017-04-17 HISTORY — PX: LEFT HEART CATH AND CORONARY ANGIOGRAPHY: CATH118249

## 2017-04-17 LAB — CBC
HEMATOCRIT: 41 % (ref 36.0–46.0)
HEMOGLOBIN: 13.6 g/dL (ref 12.0–15.0)
MCH: 29.1 pg (ref 26.0–34.0)
MCHC: 33.2 g/dL (ref 30.0–36.0)
MCV: 87.8 fL (ref 78.0–100.0)
Platelets: 326 10*3/uL (ref 150–400)
RBC: 4.67 MIL/uL (ref 3.87–5.11)
RDW: 13.6 % (ref 11.5–15.5)
WBC: 9.8 10*3/uL (ref 4.0–10.5)

## 2017-04-17 LAB — LIPID PANEL
CHOL/HDL RATIO: 2.7 ratio
CHOLESTEROL: 102 mg/dL (ref 0–200)
HDL: 38 mg/dL — ABNORMAL LOW (ref >40)
LDL CALC: 56 mg/dL (ref 0–99)
Triglycerides: 40 mg/dL (ref <150)
VLDL: 8 mg/dL (ref 0–40)

## 2017-04-17 LAB — HEPARIN LEVEL (UNFRACTIONATED)
HEPARIN UNFRACTIONATED: 0.25 [IU]/mL — AB (ref 0.30–0.70)
Heparin Unfractionated: 0.33 IU/mL (ref 0.30–0.70)

## 2017-04-17 LAB — GLUCOSE, CAPILLARY
GLUCOSE-CAPILLARY: 91 mg/dL (ref 65–99)
Glucose-Capillary: 91 mg/dL (ref 65–99)
Glucose-Capillary: 90 mg/dL (ref 65–99)
Glucose-Capillary: 112 mg/dL — ABNORMAL HIGH (ref 65–99)

## 2017-04-17 LAB — BASIC METABOLIC PANEL
Anion gap: 10 (ref 5–15)
BUN: 15 mg/dL (ref 6–20)
CALCIUM: 9 mg/dL (ref 8.9–10.3)
CO2: 23 mmol/L (ref 22–32)
Chloride: 105 mmol/L (ref 101–111)
Creatinine, Ser: 1.33 mg/dL — ABNORMAL HIGH (ref 0.44–1.00)
GFR, EST AFRICAN AMERICAN: 49 mL/min — AB (ref >60)
GFR, EST NON AFRICAN AMERICAN: 42 mL/min — AB (ref >60)
GLUCOSE: 96 mg/dL (ref 65–99)
POTASSIUM: 3.5 mmol/L (ref 3.5–5.1)
Sodium: 138 mmol/L (ref 135–145)

## 2017-04-17 LAB — PROTIME-INR
INR: 1.03
Prothrombin Time: 13.5 seconds (ref 11.4–15.2)

## 2017-04-17 LAB — HEMOGLOBIN A1C
Hgb A1c MFr Bld: 6.6 % — ABNORMAL HIGH (ref 4.8–5.6)
MEAN PLASMA GLUCOSE: 143 mg/dL

## 2017-04-17 LAB — MAGNESIUM: Magnesium: 2.1 mg/dL (ref 1.7–2.4)

## 2017-04-17 LAB — TROPONIN I: TROPONIN I: 15.84 ng/mL — AB (ref <0.03)

## 2017-04-17 SURGERY — LEFT HEART CATH AND CORONARY ANGIOGRAPHY
Anesthesia: LOCAL

## 2017-04-17 MED ORDER — IOPAMIDOL (ISOVUE-370) INJECTION 76%
INTRAVENOUS | Status: AC
  Filled 2017-04-17: qty 100

## 2017-04-17 MED ORDER — SODIUM CHLORIDE 0.9 % IV SOLN
INTRAVENOUS | Status: AC
  Administered 2017-04-17: 11:00:00 via INTRAVENOUS

## 2017-04-17 MED ORDER — LIDOCAINE HCL (PF) 1 % IJ SOLN
INTRAMUSCULAR | Status: DC | PRN
Start: 2017-04-17 — End: 2017-04-17
  Administered 2017-04-17: 2 mL via SUBCUTANEOUS

## 2017-04-17 MED ORDER — HEPARIN SODIUM (PORCINE) 1000 UNIT/ML IJ SOLN
INTRAMUSCULAR | Status: AC
  Filled 2017-04-17: qty 1

## 2017-04-17 MED ORDER — SODIUM CHLORIDE 0.9 % IV SOLN: 250.0000 mL | INTRAVENOUS | Status: DC | PRN

## 2017-04-17 MED ORDER — HEPARIN (PORCINE) IN NACL 2-0.9 UNIT/ML-% IJ SOLN
INTRAMUSCULAR | Status: AC | PRN
  Administered 2017-04-17: 1000 mL

## 2017-04-17 MED ORDER — VERAPAMIL HCL 2.5 MG/ML IV SOLN
INTRAVENOUS | Status: DC | PRN
  Administered 2017-04-17: 10 mL via INTRA_ARTERIAL

## 2017-04-17 MED ORDER — ONDANSETRON HCL 4 MG/2ML IJ SOLN: 4.0000 mg | Freq: Four times a day (QID) | INTRAMUSCULAR | Status: DC | PRN

## 2017-04-17 MED ORDER — MIDAZOLAM HCL 2 MG/2ML IJ SOLN
INTRAMUSCULAR | Status: DC | PRN
  Administered 2017-04-17: 2 mg via INTRAVENOUS

## 2017-04-17 MED ORDER — HEPARIN (PORCINE) IN NACL 2-0.9 UNIT/ML-% IJ SOLN
INTRAMUSCULAR | Status: AC
  Filled 2017-04-17: qty 500

## 2017-04-17 MED ORDER — MIDAZOLAM HCL 2 MG/2ML IJ SOLN
INTRAMUSCULAR | Status: AC
  Filled 2017-04-17: qty 2

## 2017-04-17 MED ORDER — VERAPAMIL HCL 2.5 MG/ML IV SOLN
INTRAVENOUS | Status: AC
  Filled 2017-04-17: qty 2

## 2017-04-17 MED ORDER — HEPARIN SODIUM (PORCINE) 1000 UNIT/ML IJ SOLN
INTRAMUSCULAR | Status: AC
  Filled 2017-04-17: qty 2

## 2017-04-17 MED ORDER — IOPAMIDOL (ISOVUE-370) INJECTION 76%
INTRAVENOUS | Status: DC | PRN
  Administered 2017-04-17: 55 mL via INTRA_ARTERIAL

## 2017-04-17 MED ORDER — FENTANYL CITRATE (PF) 100 MCG/2ML IJ SOLN
INTRAMUSCULAR | Status: DC | PRN
  Administered 2017-04-17: 25 ug via INTRAVENOUS

## 2017-04-17 MED ORDER — ACETAMINOPHEN 325 MG PO TABS: 650.0000 mg | ORAL_TABLET | ORAL | Status: DC | PRN

## 2017-04-17 MED ORDER — HEPARIN (PORCINE) IN NACL 100-0.45 UNIT/ML-% IJ SOLN
1150.0000 [IU]/h | INTRAMUSCULAR | Status: DC
  Administered 2017-04-17: 1000 [IU]/h via INTRAVENOUS
  Administered 2017-04-18 – 2017-04-20 (×2): 1150 [IU]/h via INTRAVENOUS
  Filled 2017-04-17 (×4): qty 250

## 2017-04-17 MED ORDER — SODIUM CHLORIDE 0.9% FLUSH
3.0000 mL | Freq: Two times a day (BID) | INTRAVENOUS | Status: DC
  Administered 2017-04-17 – 2017-04-20 (×7): 3 mL via INTRAVENOUS

## 2017-04-17 MED ORDER — FENTANYL CITRATE (PF) 100 MCG/2ML IJ SOLN
INTRAMUSCULAR | Status: AC
  Filled 2017-04-17: qty 2

## 2017-04-17 MED ORDER — LIDOCAINE HCL (PF) 1 % IJ SOLN
INTRAMUSCULAR | Status: AC
  Filled 2017-04-17: qty 30

## 2017-04-17 MED ORDER — HEPARIN SODIUM (PORCINE) 1000 UNIT/ML IJ SOLN
INTRAMUSCULAR | Status: DC | PRN
  Administered 2017-04-17: 5000 [IU] via INTRAVENOUS

## 2017-04-17 MED ORDER — SODIUM CHLORIDE 0.9% FLUSH: 3.0000 mL | INTRAVENOUS | Status: DC | PRN

## 2017-04-17 MED ORDER — LIDOCAINE HCL (CARDIAC) 20 MG/ML IV SOLN
INTRAVENOUS | Status: AC
  Filled 2017-04-17: qty 5

## 2017-04-17 SURGICAL SUPPLY — 9 items

## 2017-04-17 NOTE — Progress Notes (Signed)
[facility] Progress Note  Patient: Sara [name] HWK:[id number]   PCP: [name], MD DOB: August 30, 1956   DOA: 04/15/2017   DOS: 04/17/2017   Date of Service: the patient was seen and examined on 04/17/2017  Subjective: No chest pain nausea or vomiting. No abdominal pain. No shortness of breath.  Brief hospital course: Pt. with PMH of essential thrombocytosis, type II DM, gout, dyslipidemia, hypertension, OSA, PAD, renal artery stenosis with PTCA on right side; admitted on 04/15/2017, presented with complaint of chest pain, was found to have non-STEMI. Currently further plan is follow-up on recommendation from cardiothoracic surgery  Assessment and Plan: 1. Non-STEMI. Troponin peaked to 21, now trending downward. Cardiology consulted. EF 45% with hypokinesis. S/P cardiac catheterization, shows LAD as well as Didronel disease. Recommendation is for Age. Cardiac thoracic surgery consulted. Tentatively scheduled on 04/21/2017. Continue heparin at present. Continue 81 mg aspirin, nitroglycerin.  Patient's [name] is associated with angina as well as cardiotoxicity. Unclear whether this is the reason but may need adjustment under treatment based on findings of cardiac cath.  2. Essential thrombocythemia. Continue aspirin, continue [name] [name] is associated with angina as well as cardiac toxicity. May require treatment alteration.  3. Hypertension Hold Avapro 75 mg by mouth daily. Start metoprolol 12.5 mg by mouth daily.  Monitor blood pressure, renal function and electrolytes..  4. Type 2 diabetes mellitus (HCC) Carbohydrate modified diet. CBG monitoring with regular insulin sliding scale while in the hospital.  5.  OSA (obstructive sleep apnea) Continue CPAP at bedtime.    6. Hyperlipidemia On atorvastatin 10 mg by mouth daily    Diet: cardiac diet DVT Prophylaxis: heparin  Advance goals of care discussion: full code  Family Communication: no family was  present at bedside, at the time of interview.  Disposition:  Discharge to home.  Consultants: cardiology, cardiothoracic surgery Procedures: Echocardiogram , cardiac cath  Antibiotics: Anti-infectives    None       Objective: Physical Exam: Vitals:   04/17/17 1000 04/17/17 1015 04/17/17 1030 04/17/17 1445  BP: (!) 100/46 (!) 114/59 (!) 129/92   Pulse:    (!) 114  Resp: 16 12 19  (!) 32  Temp:      TempSrc:      SpO2:  97% 96% 96%  Weight:      Height:        Intake/Output Summary (Last 24 hours) at 04/17/17 1654 Last data filed at 04/17/17 1446  Gross per 24 hour  Intake           992.59 ml  Output                0 ml  Net           992.59 ml   Filed Weights   04/15/17 1450 04/17/17 0500  Weight: 99.8 kg (220 lb) 98.5 kg (217 lb 1.6 oz)   General: Alert, Awake and Oriented to Time, Place and Person. Appear in mild distress, affect appropriate Eyes: PERRL, Conjunctiva normal ENT: Oral Mucosa clear moist. Neck: no JVD, no Abnormal Mass Or lumps Cardiovascular: S1 and S2 Present, no Murmur, Peripheral Pulses Present Respiratory: normal respiratory effort, Bilateral Air entry equal and Decreased, no use of accessory muscle, Clear to Auscultation, no Crackles, no wheezes Abdomen: Bowel Sound present, Soft and no tenderness, no hernia Skin: no redness, no Rash, no induration Extremities: no Pedal edema, no calf tenderness Neurologic: Grossly no focal neuro deficit. Bilaterally Equal motor strength  Data Reviewed: CBC:  Recent  Labs Lab 04/12/17 1526 04/15/17 1506 04/16/17 0212 04/17/17 0450  WBC 8.5 7.9 8.1 9.8  NEUTROABS 5.3  --   --   --   HGB 13.6 14.4 14.2 13.6  HCT 40.7 43.5 43.2 41.0  MCV 88.5 89.0 88.2 87.8  PLT 400 413* 383 086   Basic Metabolic Panel:  Recent Labs Lab 04/15/17 1624 04/16/17 0212 04/16/17 0600 04/17/17 0450  NA 141 141  --  138  K 3.7 3.3*  --  3.5  CL 108 105  --  105  CO2 24 27  --  23  GLUCOSE 88 107*  --  96  BUN 10 11   --  15  CREATININE 1.15* 1.22*  --  1.33*  CALCIUM 9.1 9.2  --  9.0  MG  --   --  2.1 2.1  PHOS  --   --  3.7  --     Liver Function Tests:  Recent Labs Lab 04/16/17 0212  AST 70*  ALT 25  ALKPHOS 72  BILITOT 0.7  PROT 7.0  ALBUMIN 3.3*   No results for input(s): LIPASE, AMYLASE in the last 168 hours. No results for input(s): AMMONIA in the last 168 hours. Coagulation Profile:  Recent Labs Lab 04/15/17 1900 04/17/17 0450  INR 1.09 1.03   Cardiac Enzymes:  Recent Labs Lab 04/16/17 0212 04/16/17 0937 04/16/17 1604 04/17/17 0450  TROPONINI 3.83* 5.57* 21.64* 15.84*   BNP (last 3 results) No results for input(s): PROBNP in the last 8760 hours. CBG:  Recent Labs Lab 04/16/17 1104 04/16/17 1655 04/17/17 0802 04/17/17 1139 04/17/17 1637  GLUCAP 149* 106* 91 91 90   Studies: No results found.  Scheduled Meds: . anagrelide  1 mg Oral Daily  . aspirin EC  81 mg Oral Daily  . atorvastatin  10 mg Oral q1800  . cholecalciferol  2,000 Units Oral Daily  . insulin aspart  0-9 Units Subcutaneous TID WC  . lacosamide  100 mg Oral Daily   And  . lacosamide  150 mg Oral QHS  . metoprolol tartrate  25 mg Oral BID  . nitroGLYCERIN  1 inch Topical Q6H  . sodium chloride flush  3 mL Intravenous Q12H   Continuous Infusions: . sodium chloride    . heparin 1,000 Units/hr (04/17/17 1552)   PRN Meds: sodium chloride, acetaminophen, ALPRAZolam, ondansetron (ZOFRAN) IV, simethicone, sodium chloride flush  Time spent: 35 minutes  Author: Berle Mull, MD [facility] Pager: [phone] 04/17/2017 4:54 PM  If 7PM-7AM, please contact night-coverage at [email or web address], password [facility]

## 2017-04-17 NOTE — Care Management Note (Signed)
Case Management Note  Patient Details  Name: Sara [name] MRN: [id number] Date of Birth: 09-10-1956  Subjective/Objective:   Pt presented for Chest Pain-found to have Nstemi. Post cardiac cath-  cardiac surgery to consult for CABG. Pt is from home with husband and has support of daughter Holly Bodily that lives in [place]. PTA pt was working and independent. Pt works for the school system.                 Action/Plan: PT/OT consult once stable post CABG for disposition recommendations. CM will continue to monitor for additional home needs post procedure.   Expected Discharge Date:  04/18/17               Expected Discharge Plan:  [place]  In-House Referral:  NA  Discharge planning Services  CM Consult  Post Acute Care Choice:    Choice offered to:     DME Arranged:    DME Agency:     HH Arranged:    HH Agency:     Status of Service:  In process, will continue to follow  If discussed at Long Length of Stay Meetings, dates discussed:    Additional Comments:  Sara, Lackie, RN 04/17/2017, 10:35 AM

## 2017-04-17 NOTE — Plan of Care (Signed)
Problem: Safety: Goal: Ability to remain free from injury will improve Outcome: Progressing Patient appropriately calls for assistance.   Problem: Tissue Perfusion: Goal: Risk factors for ineffective tissue perfusion will decrease Outcome: Progressing Heparin continues to run at 10 cc/hr, will be shut off on call for cath. Nitro Ointment applied Q6 hr, no complaints of chest pain during shift.   Problem: Nutrition: Goal: Adequate nutrition will be maintained Outcome: Progressing NPO at midnight for cath

## 2017-04-17 NOTE — Progress Notes (Signed)
Progress Note  Patient Name: Sara [name] Date of Encounter: 04/17/2017  Primary Cardiologist: Croitoru  Subjective   No chest pain or shortness of breath today. Pt is s/p heart cath this am demonstrating severe LAD/diagonal disease with cardiac surgical consult for CABG recommended. Her family is at the bedside. 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  Inpatient Medications    Scheduled Meds: . anagrelide  1 mg Oral Daily  . aspirin EC  81 mg Oral Daily  . atorvastatin  10 mg Oral q1800  . cholecalciferol  2,000 Units Oral Daily  . insulin aspart  0-9 Units Subcutaneous TID WC  . lacosamide  100 mg Oral Daily   And  . lacosamide  150 mg Oral QHS  . metoprolol tartrate  25 mg Oral BID  . nitroGLYCERIN  1 inch Topical Q6H  . sodium chloride flush  3 mL Intravenous Q12H   Continuous Infusions: . sodium chloride    . heparin     PRN Meds: sodium chloride, acetaminophen, ALPRAZolam, ondansetron (ZOFRAN) IV, simethicone, sodium chloride flush   Vital Signs    Vitals:   04/17/17 1000 04/17/17 1015 04/17/17 1030 04/17/17 1445  BP: (!) 100/46 (!) 114/59 (!) 129/92   Pulse:    (!) 114  Resp: 16 12 19  (!) 32  Temp:      TempSrc:      SpO2:  97% 96% 96%  Weight:      Height:        Intake/Output Summary (Last 24 hours) at 04/17/17 1544 Last data filed at 04/17/17 1446  Gross per 24 hour  Intake           992.59 ml  Output                0 ml  Net           992.59 ml   Filed Weights   04/15/17 1450 04/17/17 0500  Weight: 220 lb (99.8 kg) 217 lb 1.6 oz (98.5 kg)    Telemetry    Normal sinus rhythm - Personally Reviewed  Physical Exam  Pleasant obese woman in no distress GEN: No acute distress.   Neck: No JVD Cardiac: RRR, no murmurs, rubs, or gallops.  Respiratory: Clear to auscultation bilaterally. GI: Soft, nontender, non-distended  MS: No edema; No deformity. Right radial site is clear Neuro:  Nonfocal  Psych:  Normal affect   Labs    Chemistry Recent Labs Lab 04/15/17 1624 04/16/17 0212 04/17/17 0450  NA 141 141 138  K 3.7 3.3* 3.5  CL 108 105 105  CO2 24 27 23   GLUCOSE 88 107* 96  BUN 10 11 15   CREATININE 1.15* 1.22* 1.33*  CALCIUM 9.1 9.2 9.0  PROT  --  7.0  --   ALBUMIN  --  3.3*  --   AST  --  70*  --   ALT  --  25  --   ALKPHOS  --  72  --   BILITOT  --  0.7  --   GFRNONAA 50* 46* 42*  GFRAA 58* 54* 49*  ANIONGAP 9 9 10      Hematology Recent Labs Lab 04/15/17 1506 04/16/17 0212 04/17/17 0450  WBC 7.9 8.1 9.8  RBC 4.89 4.90 4.67  HGB 14.4 14.2 13.6  HCT 43.5 43.2 41.0  MCV 89.0 88.2 87.8  MCH 29.4 29.0 29.1  MCHC 33.1 32.9 33.2  RDW 14.3 13.3 13.6  PLT 413* 383 326    Cardiac Enzymes Recent Labs Lab 04/16/17 0212 04/16/17 0937 04/16/17 1604 04/17/17 0450  TROPONINI 3.83* 5.57* 21.64* 15.84*    Recent Labs Lab 04/15/17 1517 04/15/17 2237  TROPIPOC 0.11* 1.03*     BNPNo results for input(s): BNP, PROBNP in the last 168 hours.   DDimer No results for input(s): DDIMER in the last 168 hours.   Radiology    No results found.  Cardiac Studies   2-D echocardiograms 04/16/2017: Impressions:  - Normal LV size with mild LV hypertrophy. Hypokinesis of the   anteroseptal wall, the apex, and the apical inferior wall. EF   45%. Normal RV size and systolic function. No significant   valvular abnormalities.  Patient Profile     61 y.o. female with diabetes, peripheral arterial disease, hypertension, and hyperlipidemia who presented with non-ST elevation infarction.  Assessment & Plan   1. Non-STEMI: The patient's troponin peaked at 21 mg/dL. She has now stable and chest pain-free. She has mild LV dysfunction with LVEF estimated at 45%. I have reviewed the patient's cardiac catheterizations films which demonstrate severe LAD/diagonal disease with critical stenosis at the LAD/first diagonal bifurcation and diffuse disease in the distal LAD as well as  severe diffuse disease in the diagonal. Considering the patient's diabetes, LV dysfunction, and complexity of disease, CABG seems reasonable as the most durable treatment option. A cardiac surgical consultation is called. The patient will continue on IV heparin. Reviewed plans at length with the patient and her family.  2. Type 2 diabetes: Blood glucoses are well controlled  3. Hypertension: Blood pressure controlled on current therapy  4. Stage III chronic kidney disease: ACE inhibitor on hold to reduce risk of acute kidney injury  Disposition: Resume IV heparin post PCI. Continue medical therapy with aspirin, beta blockade, and topical nitroglycerin. Request cardiac surgical consultation for CABG.  Deatra James, MD  04/17/2017, 3:44 PM

## 2017-04-17 NOTE — H&P (View-Only) (Signed)
Seen by Fellow this am SSCP with troponin 3.8 No chest pain this am ECG not acute On heparin Discussed cath and she is willing to proceed Add beta blocker for tachycardia Orders written placed on board  Jenkins Rouge

## 2017-04-17 NOTE — Interval H&P Note (Signed)
Cath Lab Visit (complete for each Cath Lab visit)  Clinical Evaluation Leading to the Procedure:   ACS: Yes.    Non-ACS:    Anginal Classification: CCS IV  Anti-ischemic medical therapy: Minimal Therapy (1 class of medications)  Non-Invasive Test Results: No non-invasive testing performed  Prior CABG: No previous CABG      History and Physical Interval Note:  04/17/2017 8:37 AM  [name]  has presented today for surgery, with the diagnosis of NSTEMI  The various methods of treatment have been discussed with the patient and family. After consideration of risks, benefits and other options for treatment, the patient has consented to  Procedure(s): Left Heart Cath and Coronary Angiography (N/A) as a surgical intervention .  The patient's history has been reviewed, patient examined, no change in status, stable for surgery.  I have reviewed the patient's chart and labs.  Questions were answered to the patient's satisfaction.     Larae Grooms

## 2017-04-17 NOTE — Progress Notes (Signed)
This note also relates to the following rows which could not be included: Pulse Rate - Cannot attach notes to unvalidated device data Resp - Cannot attach notes to unvalidated device data SpO2 - Cannot attach notes to unvalidated device data  Pt decline use.

## 2017-04-17 NOTE — Progress Notes (Signed)
ANTICOAGULATION CONSULT NOTE - Follow Up Consult  Pharmacy Consult for Heparin  Indication: chest pain/ACS  Allergies  Allergen Reactions  . Sulfa Antibiotics Itching    Patient Measurements: Height: 5\' 2"  (157.5 cm) Weight: 217 lb 1.6 oz (98.5 kg) IBW/kg (Calculated) : 50.1  Vital Signs: Temp: 98.5 F (36.9 C) (06/25 0804) Temp Source: Oral (06/25 0804) BP: 126/76 (06/25 0920) Pulse Rate: 83 (06/25 0920)  Labs:  Recent Labs  04/15/17 1506 04/15/17 1624  04/15/17 1900  04/16/17 0212 04/16/17 0937 04/16/17 1604 04/17/17 0450  HGB 14.4  --   --   --   --  14.2  --   --  13.6  HCT 43.5  --   --   --   --  43.2  --   --  41.0  PLT 413*  --   --   --   --  383  --   --  326  APTT  --   --   --  139*  --   --   --   --   --   LABPROT  --   --   --  14.1  --   --   --   --  13.5  INR  --   --   --  1.09  --   --   --   --  1.03  HEPARINUNFRC  --   --   < > 1.09*  --  0.36 0.44  --  0.33  CREATININE  --  1.15*  --   --   --  1.22*  --   --  1.33*  TROPONINI  --   --   --   --   < > 3.83* 5.57* 21.64* 15.84*  < > = values in this interval not displayed.  Estimated Creatinine Clearance: 48.1 mL/min (A) (by C-G formula based on SCr of 1.33 mg/dL (H)).   Assessment: Heparin for CP/elevated troponin. Not an anticoagulation PTA. Noted to be on anagrelide for thrombocythemia. Will need to this closely when used with anticoagulation/antiplatelets for increased risk of bleed.  Heparin to resume at 1530 pm, 6 hours post sheath removal Planning CABG consult  Goal of Therapy:  Heparin level 0.3-0.7 units/ml Monitor platelets by anticoagulation protocol: Yes   Plan:  Heparin at 1000 units / hr starting at 1530 pm 8 hour heparin level Daily labs  Thank you Anette Guarneri, PharmD [phone]  04/17/2017

## 2017-04-18 ENCOUNTER — Inpatient Hospital Stay (HOSPITAL_COMMUNITY): Payer: BC Managed Care – PPO

## 2017-04-18 DIAGNOSIS — I25110 Atherosclerotic heart disease of native coronary artery with unstable angina pectoris: Secondary | ICD-10-CM

## 2017-04-18 DIAGNOSIS — R748 Abnormal levels of other serum enzymes: Secondary | ICD-10-CM

## 2017-04-18 DIAGNOSIS — I214 Non-ST elevation (NSTEMI) myocardial infarction: Secondary | ICD-10-CM

## 2017-04-18 DIAGNOSIS — Z01810 Encounter for preprocedural cardiovascular examination: Secondary | ICD-10-CM

## 2017-04-18 LAB — BASIC METABOLIC PANEL
ANION GAP: 10 (ref 5–15)
BUN: 15 mg/dL (ref 6–20)
CALCIUM: 8.9 mg/dL (ref 8.9–10.3)
CO2: 22 mmol/L (ref 22–32)
CREATININE: 1.22 mg/dL — AB (ref 0.44–1.00)
Chloride: 108 mmol/L (ref 101–111)
GFR calc non Af Amer: 46 mL/min — ABNORMAL LOW (ref >60)
GFR, EST AFRICAN AMERICAN: 54 mL/min — AB (ref >60)
Glucose, Bld: 100 mg/dL — ABNORMAL HIGH (ref 65–99)
Potassium: 3.8 mmol/L (ref 3.5–5.1)
SODIUM: 140 mmol/L (ref 135–145)

## 2017-04-18 LAB — GLUCOSE, CAPILLARY
GLUCOSE-CAPILLARY: 103 mg/dL — AB (ref 65–99)
GLUCOSE-CAPILLARY: 100 mg/dL — AB (ref 65–99)
Glucose-Capillary: 94 mg/dL (ref 65–99)
Glucose-Capillary: 125 mg/dL — ABNORMAL HIGH (ref 65–99)

## 2017-04-18 LAB — VAS US DOPPLER PRE CABG
LEFT ECA DIAS: -8 cm/s
LEFT VERTEBRAL DIAS: 12 cm/s
LICAPDIAS: -20 cm/s
Left CCA dist dias: -19 cm/s
Left CCA dist sys: -62 cm/s
Left CCA prox dias: 23 cm/s
Left CCA prox sys: 68 cm/s
Left ICA dist dias: -49 cm/s
Left ICA dist sys: -109 cm/s
Left ICA prox sys: -60 cm/s
RIGHT ECA DIAS: -10 cm/s
RIGHT VERTEBRAL DIAS: 9 cm/s
Right CCA prox dias: 15 cm/s
Right CCA prox sys: 63 cm/s
Right cca dist sys: -74 cm/s

## 2017-04-18 LAB — CBC
HEMATOCRIT: 39.5 % (ref 36.0–46.0)
Hemoglobin: 12.8 g/dL (ref 12.0–15.0)
MCH: 28.6 pg (ref 26.0–34.0)
MCHC: 32.4 g/dL (ref 30.0–36.0)
MCV: 88.2 fL (ref 78.0–100.0)
Platelets: 316 10*3/uL (ref 150–400)
RBC: 4.48 MIL/uL (ref 3.87–5.11)
RDW: 13.6 % (ref 11.5–15.5)
WBC: 7.8 10*3/uL (ref 4.0–10.5)

## 2017-04-18 LAB — HEPARIN LEVEL (UNFRACTIONATED): Heparin Unfractionated: 0.36 IU/mL (ref 0.30–0.70)

## 2017-04-18 MED ORDER — ISOSORBIDE MONONITRATE ER 30 MG PO TB24
15.0000 mg | ORAL_TABLET | Freq: Every day | ORAL | Status: DC
  Administered 2017-04-18 – 2017-04-20 (×3): 15 mg via ORAL
  Filled 2017-04-18 (×3): qty 1

## 2017-04-18 MED ORDER — POLYETHYLENE GLYCOL 3350 17 G PO PACK
17.0000 g | PACK | Freq: Every day | ORAL | Status: DC
  Administered 2017-04-18 – 2017-04-19 (×2): 17 g via ORAL
  Filled 2017-04-18 (×3): qty 1

## 2017-04-18 MED ORDER — SENNOSIDES-DOCUSATE SODIUM 8.6-50 MG PO TABS
1.0000 | ORAL_TABLET | Freq: Two times a day (BID) | ORAL | Status: DC
  Administered 2017-04-18 – 2017-04-20 (×3): 1 via ORAL
  Filled 2017-04-18 (×4): qty 1

## 2017-04-18 MED FILL — Lidocaine HCl Local Preservative Free (PF) Inj 1%: INTRAMUSCULAR | Qty: 30 | Status: AC

## 2017-04-18 NOTE — Plan of Care (Signed)
Problem: Education: Goal: Knowledge of [facility] Education information/materials will improve Outcome: Progressing Spoke with patient and daughter regarding CABG. Physician to be in tomorrow to answer any questions the patient or family has regarding the procedure.   Problem: Pain Managment: Goal: General experience of comfort will improve Outcome: Progressing Patient has had no complaints of pain this shift.   Problem: Tissue Perfusion: Goal: Risk factors for ineffective tissue perfusion will decrease Outcome: Progressing Heparin increased from 10 cc/hr to 11.5 cc/hr.   Problem: Bowel/Gastric: Goal: Will not experience complications related to bowel motility Outcome: Progressing Patient has not had a bowel movement but requested not to have anything to aid in a BM so she could rest.

## 2017-04-18 NOTE — Procedures (Signed)
Patient refused CPAP for the night  

## 2017-04-18 NOTE — Progress Notes (Signed)
Progress Note  Patient Name: Sara [name] Date of Encounter: 04/18/2017  Primary Cardiologist: Croitoru  Subjective   She felt mild retrosternal short lasting chest pain while brushing her teeth. Now asymptomatic, no SOB.  Inpatient Medications    Scheduled Meds: . anagrelide  1 mg Oral Daily  . aspirin EC  81 mg Oral Daily  . atorvastatin  10 mg Oral q1800  . cholecalciferol  2,000 Units Oral Daily  . insulin aspart  0-9 Units Subcutaneous TID WC  . lacosamide  100 mg Oral Daily   And  . lacosamide  150 mg Oral QHS  . metoprolol tartrate  25 mg Oral BID  . nitroGLYCERIN  1 inch Topical Q6H  . sodium chloride flush  3 mL Intravenous Q12H   Continuous Infusions: . sodium chloride    . heparin 1,150 Units/hr (04/18/17 0039)   PRN Meds: sodium chloride, acetaminophen, ALPRAZolam, ondansetron (ZOFRAN) IV, simethicone, sodium chloride flush   Vital Signs    Vitals:   04/17/17 2200 04/18/17 0002 04/18/17 0418 04/18/17 0814  BP:  (!) 107/57 129/66 (!) 144/92  Pulse: 83 70 84 92  Resp: (!) 0 17 17 (!) 21  Temp:  98.1 F (36.7 C) 98.4 F (36.9 C) 98 F (36.7 C)  TempSrc:  Oral Oral Oral  SpO2: 94% 95% 96% 96%  Weight:   218 lb 3.2 oz (99 kg)   Height:        Intake/Output Summary (Last 24 hours) at 04/18/17 1035 Last data filed at 04/18/17 0858  Gross per 24 hour  Intake           1444.5 ml  Output              400 ml  Net           1044.5 ml   Filed Weights   04/15/17 1450 04/17/17 0500 04/18/17 0418  Weight: 220 lb (99.8 kg) 217 lb 1.6 oz (98.5 kg) 218 lb 3.2 oz (99 kg)    Telemetry    Normal sinus rhythm - Personally Reviewed  Physical Exam  Pleasant obese woman in no distress GEN: No acute distress.   Neck: No JVD Cardiac: RRR, no murmurs, rubs, or gallops.  Respiratory: Clear to auscultation bilaterally. GI: Soft, nontender, non-distended  MS: No edema; No deformity. Right radial site is clear Neuro:  Nonfocal  Psych: Normal affect    Labs    Chemistry  Recent Labs Lab 04/16/17 0212 04/17/17 0450 04/18/17 0459  NA 141 138 140  K 3.3* 3.5 3.8  CL 105 105 108  CO2 27 23 22   GLUCOSE 107* 96 100*  BUN 11 15 15   CREATININE 1.22* 1.33* 1.22*  CALCIUM 9.2 9.0 8.9  PROT 7.0  --   --   ALBUMIN 3.3*  --   --   AST 70*  --   --   ALT 25  --   --   ALKPHOS 72  --   --   BILITOT 0.7  --   --   GFRNONAA 46* 42* 46*  GFRAA 54* 49* 54*  ANIONGAP 9 10 10      Hematology  Recent Labs Lab 04/16/17 0212 04/17/17 0450 04/18/17 0459  WBC 8.1 9.8 7.8  RBC 4.90 4.67 4.48  HGB 14.2 13.6 12.8  HCT 43.2 41.0 39.5  MCV 88.2 87.8 88.2  MCH 29.0 29.1 28.6  MCHC 32.9 33.2 32.4  RDW 13.3 13.6 13.6  PLT 383 326 316  Cardiac Enzymes  Recent Labs Lab 04/16/17 0212 04/16/17 0937 04/16/17 1604 04/17/17 0450  TROPONINI 3.83* 5.57* 21.64* 15.84*     Recent Labs Lab 04/15/17 1517 04/15/17 2237  TROPIPOC 0.11* 1.03*     BNPNo results for input(s): BNP, PROBNP in the last 168 hours.   DDimer No results for input(s): DDIMER in the last 168 hours.   Radiology    No results found.  Cardiac Studies   2-D echocardiograms 04/16/2017: Impressions:  - Normal LV size with mild LV hypertrophy. Hypokinesis of the   anteroseptal wall, the apex, and the apical inferior wall. EF   45%. Normal RV size and systolic function. No significant   valvular abnormalities.  Patient Profile     61 y.o. female with diabetes, peripheral arterial disease, hypertension, and hyperlipidemia who presented with non-ST elevation infarction.  Assessment & Plan   1. Non-STEMI: The patient's troponin peaked at 21 mg/dL, now down to 15. She has now stable and had mild ches pain this am. She has mild LV dysfunction with LVEF estimated at 45%. Cardiac catheterizations films show severe LAD/diagonal disease with critical stenosis at the LAD/first diagonal bifurcation and diffuse disease in the distal LAD as well as severe diffuse disease  in the diagonal. Considering the patient's diabetes, LV dysfunction, and complexity of disease, CABG seems reasonable as the most durable treatment option. A cardiac surgical consultation was called. The patient will continue on IV heparin. Reviewed plans at length with the patient and her family. CABG tentatively scheduled for Friday 6/29 with Dr Cyndia Bent.   2. Type 2 diabetes: Blood glucoses are well controlled  3. Hypertension: Blood pressure elevated, I would add imdur 15 mg po daily, hold ACEI/ARB as she has CKD stage 3 and we want to reduce kidney injury prior to CABG.   4. Stage III chronic kidney disease: ACE inhibitor on hold to reduce risk of acute kidney injury  5. Thrombocytosis - followed by hematology, treated with anagrelide - antiplatelet agent and PDE 3 inhibitor  Possible side effects include: . Bleeding risk: Major hemorrhagic events have occurred when used concomitantly with aspirin. Monitor closely for bleeding, particularly when used concurrently with other agents known to increase bleeding risk (eg, anticoagulants, NSAIDs, antiplatelet agents, other phosphodiesterase 3 (PDE) inhibitors, and selective serotonin reuptake inhibitors). . Cardiovascular adverse events: Ventricular tachycardia and torsades de pointes have been reported. As with other PDE3 inhibitors, anagrelide may cause vasodilation, tachycardia, palpitations and heart failure. PDE3 inhibitors are associated with decreased survival (compared to placebo) in patients with class III or IV heart failure. In a scientific statement from the [employer], anagrelide has been determined to be an agent that may cause direct myocardial toxicity (magnitude: major) (AHA [Page 2016]). Dose-related increases in heart rate and mean QTc interval have been observed in a clinical trial. The maximum change in mean heart rate was ~8 beats per minute (bpm) at a dose of 0.5 mg and ~29 bpm with a 2.5 mg dose. The maximum mean  change in QTc I (individual subject correlation) from placebo was 7 ms and 13 ms with doses of 0.5 mg and 2.5 mg, respectively. Do not use in patients with hypokalemia, congenital long QT syndrome, a known history of acquired QTc prolongation, or when using concomitant therapy which may prolong the QTc interval. Hypotension accompanied by dizziness may occur, particularly with higher doses. Use with caution in patients with cardiovascular disease (eg, heart failure, bradyarrhythmias, electrolyte abnormalities); consider periodic ECGs; benefits should outweigh risks.  Pretreatment cardiovascular evaluation (including ECG) and careful monitoring during treatment is recommended.  We will hold for now, we will discuss with hematology.   Signed, Ena Dawley, MD  04/18/2017, 10:35 AM

## 2017-04-18 NOTE — Progress Notes (Signed)
Pre-op Cardiac Surgery  Carotid Findings:  Findings suggest 1-39% internal carotid artery stenosis bilaterally. Vertebral arteries are patent with antegrade flow.  Upper Extremity Right Left  Brachial Pressures 156-Triphasic 174-Triphasic  Radial Waveforms Triphasic Triphasic  Ulnar Waveforms Triphasic Triphasic  Palmar Arch (Allen's Test) Signal obliterates with radial compression, is unaffected with ulnar compression. Signal decreases <50% with radial compression, obliterates with ulnar compression.   Lower  Extremity Right Left  Dorsalis Pedis Triphasic Triphasic  Posterior Tibial Triphasic Triphasic    Findings:   Bilateral pedal artery waveforms are within normal limits at rest.  04/18/2017 11:54 AM Maudry Mayhew, BS, RVT, RDCS, RDMS

## 2017-04-18 NOTE — Progress Notes (Signed)
[place] for Heparin  Indication: chest pain/ACS  Allergies  Allergen Reactions  . Sulfa Antibiotics Itching    Patient Measurements: Height: 5\' 2"  (157.5 cm) Weight: 217 lb 1.6 oz (98.5 kg) IBW/kg (Calculated) : 50.1  Vital Signs: Temp: 97.6 F (36.4 C) (06/25 2117) Temp Source: Oral (06/25 2117) BP: 137/76 (06/25 2117) Pulse Rate: 83 (06/25 2200)  Labs:  Recent Labs  04/15/17 1506 04/15/17 1624  04/15/17 1900  04/16/17 0212 04/16/17 0937 04/16/17 1604 04/17/17 0450 04/17/17 2309  HGB 14.4  --   --   --   --  14.2  --   --  13.6  --   HCT 43.5  --   --   --   --  43.2  --   --  41.0  --   PLT 413*  --   --   --   --  383  --   --  326  --   APTT  --   --   --  139*  --   --   --   --   --   --   LABPROT  --   --   --  14.1  --   --   --   --  13.5  --   INR  --   --   --  1.09  --   --   --   --  1.03  --   HEPARINUNFRC  --   --   < > 1.09*  --  0.36 0.44  --  0.33 0.25*  CREATININE  --  1.15*  --   --   --  1.22*  --   --  1.33*  --   TROPONINI  --   --   --   --   < > 3.83* 5.57* 21.64* 15.84*  --   < > = values in this interval not displayed.  Estimated Creatinine Clearance: 48.1 mL/min (A) (by C-G formula based on SCr of 1.33 mg/dL (H)).   Assessment: 61 y.o. female with CAD awaiting possible CABG for heparin  Goal of Therapy:  Heparin level 0.3-0.7 units/ml Monitor platelets by anticoagulation protocol: Yes   Plan:  Increase Heparin 1150 units/hr Follow-up am labs.   Phillis Knack, PharmD, BCPS  04/18/2017

## 2017-04-18 NOTE — Consult Note (Signed)
[address]       [place] [address]             [phone]      Cardiothoracic Surgery Consultation  Reason for Consult: Severe single vessel coronary artery disease Referring Physician: Dr. Casandra Doffing  Sara [name] is an 61 y.o. female.  HPI:   The patient is a 61 year old woman with diabetes, hyperlipidemia, hypertension, OSA on CPAP, RAS s/p PTCA and stenting on the right, PAD, and thrombocytosis who presents with recent onset of pressure-like chest discomfort that she thought was probably indigestion. It has come and gone several times but did not improve with antacids. There has been some radiation to the left arm and neck. She ruled in for MI with an initial troponin poc of 0.11, peaking at 21.64 yesterday afternoon and 15.84 this am. Cardiac cath yesterday afternoon showed 90% proximal LAD at the origin of a diagonal that has 80% ostial stenosis and 75% proximal stenosis. The LAD has 75% mid stenosis and 40% distal stenosis at the apex. Her LVEF by echo is 45% with hypokinesis of the anteroseptal wall and apex and was normal in 2014. She had brief chest discomfort this am.  Past Medical History:  Diagnosis Date  . Diabetes mellitus   . Gout   . Hyperlipemia   . Hypertension   . LVH (left ventricular hypertrophy)    12/21/09 Echo -mild asymmetric LVH,EF =>55%  . Morbid obesity ([place])   . OSA (obstructive sleep apnea)   . PAD (peripheral artery disease) ([place])   . Renal artery stenosis ([place]) 12/13/2010   PTCA and Stent - right  . Syncopal episodes   . Thrombocytosis ([place])     Past Surgical History:  Procedure Laterality Date  . BREAST CYST EXCISION     S/P Benign Right Breast Cyst Removal.  . BREAST REDUCTION SURGERY  1995   S/P Bilateral breast reduction  . Intercourse   Times  Two.  Marland Kitchen EXTREMITY CYST EXCISION  1993   left wrist  . LEFT HEART CATH AND CORONARY ANGIOGRAPHY N/A 04/17/2017   Procedure: Left Heart Cath and  Coronary Angiography;  Surgeon: Jettie Booze, MD;  Location: [facility];  Service: Cardiovascular;  Laterality: N/A;  . PARTIAL HYSTERECTOMY  1994  . renal artery stent placement Right     Family History  Problem Relation Age of Onset  . Heart failure Mother   . Diabetes Father   . Diabetes Sister   . Hypertension Sister   . Hypertension Sister   . Hypertension Sister   . Breast cancer Maternal Aunt        2 maternal aunts had breast cancer.  . Breast cancer Maternal Grandmother     Social History:  reports that she has never smoked. She has never used smokeless tobacco. She reports that she does not drink alcohol or use drugs.  Allergies:  Allergies  Allergen Reactions  . Sulfa Antibiotics Itching    Medications:  I have reviewed the patient's current medications. Prior to Admission:  Prescriptions Prior to Admission  Medication Sig Dispense Refill Last Dose  . anagrelide ([name]) 1 MG capsule take 1 capsule by mouth once daily 30 capsule 0 04/14/2017 at Unknown time  . aspirin EC 81 MG tablet Take 81 mg by mouth daily.   04/15/2017 at Unknown time  . atorvastatin (LIPITOR) 10 MG tablet Take 1 tablet (10 mg total)  by mouth daily at 6 PM. 90 tablet 3 04/14/2017 at Unknown time  . B Complex Vitamins (B COMPLEX PO) Take 1 tablet by mouth daily.    04/15/2017 at Unknown time  . Cholecalciferol (VITAMIN D3) 2000 UNITS capsule Take 2,000 Units by mouth daily.    04/15/2017 at Unknown time  . furosemide (LASIX) 20 MG tablet Take 20 mg by mouth as needed. Take only as needed for leg edema or weight gain greater than 3 pounds.   unknown  . lacosamide ([name]) 200 MG TABS tablet Take 100-150 mg by mouth 2 (two) times daily. 100 mg in the AM and 150 mg at bedtime   04/15/2017 at Unknown time  . simethicone (MYLICON) 80 MG chewable tablet Chew 80 mg by mouth every 6 (six) hours as needed for flatulence.   04/14/2017 at Unknown time  . telmisartan (MICARDIS) 20 MG tablet Take 20  mg by mouth 2 (two) times daily.   0 04/15/2017 at Unknown time  . Lacosamide 100 MG TABS 1/2 tablet in the morning and 1.5 tablets in the evening (Patient not taking: Reported on 04/15/2017) 180 tablet 3 Not Taking at Unknown time   Scheduled: . anagrelide  1 mg Oral Daily  . aspirin EC  81 mg Oral Daily  . atorvastatin  10 mg Oral q1800  . cholecalciferol  2,000 Units Oral Daily  . insulin aspart  0-9 Units Subcutaneous TID WC  . isosorbide mononitrate  15 mg Oral Daily  . lacosamide  100 mg Oral Daily   And  . lacosamide  150 mg Oral QHS  . metoprolol tartrate  25 mg Oral BID  . nitroGLYCERIN  1 inch Topical Q6H  . sodium chloride flush  3 mL Intravenous Q12H   Continuous: . sodium chloride    . heparin 1,150 Units/hr (04/18/17 0039)   SEG:BTDVVO chloride, acetaminophen, ALPRAZolam, ondansetron (ZOFRAN) IV, simethicone, sodium chloride flush Anti-infectives    None      Results for orders placed or performed during the hospital encounter of 04/15/17 (from the past 48 hour(s))  CBC     Status: None   Collection Time: 04/17/17  4:50 AM  Result Value Ref Range   WBC 9.8 4.0 - 10.5 K/uL   RBC 4.67 3.87 - 5.11 MIL/uL   Hemoglobin 13.6 12.0 - 15.0 g/dL   HCT 41.0 36.0 - 46.0 %   MCV 87.8 78.0 - 100.0 fL   MCH 29.1 26.0 - 34.0 pg   MCHC 33.2 30.0 - 36.0 g/dL   RDW 13.6 11.5 - 15.5 %   Platelets 326 150 - 400 K/uL  Basic metabolic panel     Status: Abnormal   Collection Time: 04/17/17  4:50 AM  Result Value Ref Range   Sodium 138 135 - 145 mmol/L   Potassium 3.5 3.5 - 5.1 mmol/L   Chloride 105 101 - 111 mmol/L   CO2 23 22 - 32 mmol/L   Glucose, Bld 96 65 - 99 mg/dL   BUN 15 6 - 20 mg/dL   Creatinine, Ser 1.33 (H) 0.44 - 1.00 mg/dL   Calcium 9.0 8.9 - 10.3 mg/dL   GFR calc non Af Amer 42 (L) >60 mL/min   GFR calc Af Amer 49 (L) >60 mL/min    Comment: (NOTE) The eGFR has been calculated using the CKD EPI equation. This calculation has not been validated in all clinical  situations. eGFR's persistently <60 mL/min signify possible Chronic Kidney Disease.    Anion gap  10 5 - 15  Magnesium     Status: None   Collection Time: 04/17/17  4:50 AM  Result Value Ref Range   Magnesium 2.1 1.7 - 2.4 mg/dL  Protime-INR     Status: None   Collection Time: 04/17/17  4:50 AM  Result Value Ref Range   Prothrombin Time 13.5 11.4 - 15.2 seconds   INR 1.03   Troponin I     Status: Abnormal   Collection Time: 04/17/17  4:50 AM  Result Value Ref Range   Troponin I 15.84 (HH) <0.03 ng/mL    Comment: CRITICAL VALUE NOTED.  VALUE IS CONSISTENT WITH PREVIOUSLY REPORTED AND CALLED VALUE.  Heparin level (unfractionated)     Status: None   Collection Time: 04/17/17  4:50 AM  Result Value Ref Range   Heparin Unfractionated 0.33 0.30 - 0.70 IU/mL    Comment:        IF HEPARIN RESULTS ARE BELOW EXPECTED VALUES, AND PATIENT DOSAGE HAS BEEN CONFIRMED, SUGGEST FOLLOW UP TESTING OF ANTITHROMBIN III LEVELS.   Glucose, capillary     Status: None   Collection Time: 04/17/17  8:02 AM  Result Value Ref Range   Glucose-Capillary 91 65 - 99 mg/dL  Lipid panel     Status: Abnormal   Collection Time: 04/17/17 10:00 AM  Result Value Ref Range   Cholesterol 102 0 - 200 mg/dL   Triglycerides 40 <150 mg/dL   HDL 38 (L) >40 mg/dL   Total CHOL/HDL Ratio 2.7 RATIO   VLDL 8 0 - 40 mg/dL   LDL Cholesterol 56 0 - 99 mg/dL    Comment:        Total Cholesterol/HDL:CHD Risk Coronary Heart Disease Risk Table                     Men   Women  1/2 Average Risk   3.4   3.3  Average Risk       5.0   4.4  2 X Average Risk   9.6   7.1  3 X Average Risk  23.4   11.0        Use the calculated Patient Ratio above and the CHD Risk Table to determine the patient's CHD Risk.        ATP III CLASSIFICATION (LDL):  <100     mg/dL   Optimal  100-129  mg/dL   Near or Above                    Optimal  130-159  mg/dL   Borderline  160-189  mg/dL   High  >190     mg/dL   Very High   Glucose,  capillary     Status: None   Collection Time: 04/17/17 11:39 AM  Result Value Ref Range   Glucose-Capillary 91 65 - 99 mg/dL  Glucose, capillary     Status: None   Collection Time: 04/17/17  4:37 PM  Result Value Ref Range   Glucose-Capillary 90 65 - 99 mg/dL   Comment 1 Document in Chart   Glucose, capillary     Status: Abnormal   Collection Time: 04/17/17  9:16 PM  Result Value Ref Range   Glucose-Capillary 112 (H) 65 - 99 mg/dL  Heparin level (unfractionated)     Status: Abnormal   Collection Time: 04/17/17 11:09 PM  Result Value Ref Range   Heparin Unfractionated 0.25 (L) 0.30 - 0.70 IU/mL    Comment:  IF HEPARIN RESULTS ARE BELOW EXPECTED VALUES, AND PATIENT DOSAGE HAS BEEN CONFIRMED, SUGGEST FOLLOW UP TESTING OF ANTITHROMBIN III LEVELS.   CBC     Status: None   Collection Time: 04/18/17  4:59 AM  Result Value Ref Range   WBC 7.8 4.0 - 10.5 K/uL   RBC 4.48 3.87 - 5.11 MIL/uL   Hemoglobin 12.8 12.0 - 15.0 g/dL   HCT 39.5 36.0 - 46.0 %   MCV 88.2 78.0 - 100.0 fL   MCH 28.6 26.0 - 34.0 pg   MCHC 32.4 30.0 - 36.0 g/dL   RDW 13.6 11.5 - 15.5 %   Platelets 316 150 - 400 K/uL  Heparin level (unfractionated)     Status: None   Collection Time: 04/18/17  4:59 AM  Result Value Ref Range   Heparin Unfractionated 0.36 0.30 - 0.70 IU/mL    Comment:        IF HEPARIN RESULTS ARE BELOW EXPECTED VALUES, AND PATIENT DOSAGE HAS BEEN CONFIRMED, SUGGEST FOLLOW UP TESTING OF ANTITHROMBIN III LEVELS.   Basic metabolic panel     Status: Abnormal   Collection Time: 04/18/17  4:59 AM  Result Value Ref Range   Sodium 140 135 - 145 mmol/L   Potassium 3.8 3.5 - 5.1 mmol/L   Chloride 108 101 - 111 mmol/L   CO2 22 22 - 32 mmol/L   Glucose, Bld 100 (H) 65 - 99 mg/dL   BUN 15 6 - 20 mg/dL   Creatinine, Ser 1.22 (H) 0.44 - 1.00 mg/dL   Calcium 8.9 8.9 - 10.3 mg/dL   GFR calc non Af Amer 46 (L) >60 mL/min   GFR calc Af Amer 54 (L) >60 mL/min    Comment: (NOTE) The eGFR has been  calculated using the CKD EPI equation. This calculation has not been validated in all clinical situations. eGFR's persistently <60 mL/min signify possible Chronic Kidney Disease.    Anion gap 10 5 - 15  Glucose, capillary     Status: Abnormal   Collection Time: 04/18/17  8:13 AM  Result Value Ref Range   Glucose-Capillary 103 (H) 65 - 99 mg/dL  Glucose, capillary     Status: Abnormal   Collection Time: 04/18/17 11:46 AM  Result Value Ref Range   Glucose-Capillary 100 (H) 65 - 99 mg/dL  Glucose, capillary     Status: None   Collection Time: 04/18/17  4:14 PM  Result Value Ref Range   Glucose-Capillary 94 65 - 99 mg/dL    No results found.  Review of Systems  Constitutional: Negative for chills, fever, malaise/fatigue and weight loss.  HENT: Negative.   Eyes: Negative.   Respiratory: Negative for shortness of breath.   Cardiovascular: Positive for chest pain. Negative for orthopnea, leg swelling and PND.  Gastrointestinal: Positive for heartburn and nausea. Negative for abdominal pain, constipation, diarrhea and vomiting.  Genitourinary: Negative.   Musculoskeletal: Negative.   Skin: Negative.   Neurological: Negative.   Endo/Heme/Allergies: Negative.   Psychiatric/Behavioral: Negative.    Blood pressure 138/74, pulse 84, temperature 98 F (36.7 C), temperature source Oral, resp. rate (!) 25, height '5\' 2"'$  (1.575 m), weight 99 kg (218 lb 3.2 oz), SpO2 97 %. Physical Exam  Constitutional: She is oriented to person, place, and time.  Obese woman in no distress.  HENT:  Head: Normocephalic and atraumatic.  Mouth/Throat: Oropharynx is clear and moist.  Eyes: EOM are normal. Pupils are equal, round, and reactive to light.  Neck: Normal range of motion.  Neck supple. No JVD present. No thyromegaly present.  Cardiovascular: Normal rate, regular rhythm, normal heart sounds and intact distal pulses.   No murmur heard. Respiratory: Effort normal and breath sounds normal. No  respiratory distress.  GI: Soft. Bowel sounds are normal. She exhibits no distension. There is no tenderness.  Musculoskeletal: Normal range of motion. She exhibits no edema.  Lymphadenopathy:    She has no cervical adenopathy.  Neurological: She is alert and oriented to person, place, and time. She has normal strength. No cranial nerve deficit or sensory deficit.  Skin: Skin is warm and dry.  Psychiatric: She has a normal mood and affect.       Physicians   Panel Physicians Referring Physician Case Authorizing Physician  Jettie Booze, MD (Primary)    Procedures   Left Heart Cath and Coronary Angiography  Conclusion     1st Diag lesion, 75 %stenosed.  Prox LAD lesion, 90 %stenosed.  Ost 1st Diag to 1st Diag lesion, 80 %stenosed.  Dist LAD-2 lesion, 40 %stenosed.  Dist LAD-1 lesion, 75 %stenosed.  Ost 1st Mrg to 1st Mrg lesion, 25 %stenosed.  LV end diastolic pressure is normal.  There is no aortic valve stenosis.  No significant disease in the nondominant RCA or dominant circumflex.   Severe complex bifurcation disease in the proximal to mid LAD at the origin of the first diagonal.  Severe disease in the diagonal as well.  Disease further in the mid LAD as well.   Plan cardiac surgery consult for CABG, with grafts to LAD and diagonal.    Indications   Non-ST elevation (NSTEMI) myocardial infarction ([place]) [I21.4 (ICD-10-CM)]  Procedural Details/Technique   Technical Details The risks, benefits, and details of the procedure were explained to the patient. The patient verbalized understanding and wanted to proceed. Informed written consent was obtained.  PROCEDURE TECHNIQUE: After Xylocaine anesthesia a 75F slender sheath was placed in the right radial artery with a single anterior needle wall stick. IV Heparin was given. Left heart cath was done using a JR4 catheter. Right coronary angiography was done using a Judkins R4 guide catheter. Left coronary  angiography was done using a Judkins L3.5 guide catheter. A TR band was used for hemostasis.  Contrast: 55    Estimated blood loss <50 mL.  During this procedure the patient was administered the following to achieve and maintain moderate conscious sedation: Versed 2 mg, Fentanyl 25 mcg, while the patient's heart rate, blood pressure, and oxygen saturation were continuously monitored. The period of conscious sedation was 27 minutes, of which I was present face-to-face 100% of this time.    Complications   Complications documented before study signed (04/17/2017 9:39 AM EDT)    No complications were associated with this study.  Documented by Jettie Booze, MD - 04/17/2017 9:32 AM EDT    Coronary Findings   Dominance: Left  Left Anterior Descending  Prox LAD lesion, 90% stenosed.  Dist LAD-1 lesion, 75% stenosed.  Dist LAD-2 lesion, 40% stenosed.  First Diagonal Branch  Ost 1st Diag to 1st Diag lesion, 80% stenosed.  1st Diag lesion, 75% stenosed.  Left Circumflex  First Obtuse Marginal Branch  Ost 1st Mrg to 1st Mrg lesion, 25% stenosed.  Left Heart   Left Ventricle LV end diastolic pressure is normal.    Aortic Valve There is no aortic valve stenosis.    Coronary Diagrams   Diagnostic Diagram       Implants     No implant  documentation for this case.  PACS Images   Show images for Cardiac catheterization   Link to Procedure Log   Procedure Log    Hemo Data    Most Recent Value  AO Systolic Pressure 601 mmHg  AO Diastolic Pressure 71 mmHg  AO Mean 98 mmHg  LV Systolic Pressure 093 mmHg  LV Diastolic Pressure 9 mmHg  LV EDP 21 mmHg  Arterial Occlusion Pressure Extended Systolic Pressure 235 mmHg  Arterial Occlusion Pressure Extended Diastolic Pressure 76 mmHg  Arterial Occlusion Pressure Extended Mean Pressure 100 mmHg  Left Ventricular Apex Extended Systolic Pressure 573 mmHg  Left Ventricular Apex Extended Diastolic Pressure 4 mmHg  Left  Ventricular Apex Extended EDP Pressure 16 mmHg         *[place]*                   *[facility]*                         [address], [place] [address]                            [phone]  ------------------------------------------------------------------- Transthoracic Echocardiography  Patient:    Kesa, Birky MR #:       [id number] Study Date: 04/16/2017 Gender:     F Age:        70 Height:     157.5 cm Weight:     99.8 kg BSA:        2.14 m^2 Pt. Status: Room:       [id number]   PERFORMING   [facility], Inpatient  ATTENDING    [place], Md  SONOGRAPHER  Hyman Bower, RCS  ADMITTING    Reubin Milan  ORDERING     [place], [place]    Reubin Milan  cc:  ------------------------------------------------------------------- LV EF: 45%  ------------------------------------------------------------------- Indications:      Elevated troponin.  ------------------------------------------------------------------- History:   Risk factors:  Hypertension. Diabetes mellitus. Dyslipidemia.  ------------------------------------------------------------------- Study Conclusions  - Left ventricle: The cavity size was normal. Wall thickness was   increased in a pattern of mild LVH. Hypokinesis of the   anteroseptal wall, the apex, and the apical inferior wall. The   estimated ejection fraction was 45%. Doppler parameters are   consistent with abnormal left ventricular relaxation (grade 1   diastolic dysfunction). - Aortic valve: There was no stenosis. - Mitral valve: There was no significant regurgitation. - Right ventricle: The cavity size was normal. Systolic function   was normal. - Tricuspid valve: Peak RV-RA gradient (S): 25 mm Hg. - Pulmonary arteries: PA peak pressure: 28 mm Hg (S). - Inferior vena cava: The vessel was normal in size. The   respirophasic diameter changes were in the normal  range (>= 50%),   consistent with normal central venous pressure.  Impressions:  - Normal LV size with mild LV hypertrophy. Hypokinesis of the   anteroseptal wall, the apex, and the apical inferior wall. EF   45%. Normal RV size and systolic function. No significant   valvular abnormalities.  ------------------------------------------------------------------- Study data:  No prior study was available for comparison.  Study status:  Routine.  Procedure:  Transthoracic echocardiography. Image quality was adequate.  Study completion:  There were no complications.          Transthoracic echocardiography.  M-mode, complete 2D, spectral Doppler, and color Doppler.  Birthdate: Patient birthdate: 02-Aug-1956.  Age:  Patient is 61 yr old.  Sex: Gender: female.    BMI: 40.2 kg/m^2.  Blood pressure:     155/59 Patient status:  Inpatient.  Study date:  Study date: 04/16/2017. Study time: 11:54 AM.  Location:  Echo laboratory.  -------------------------------------------------------------------  ------------------------------------------------------------------- Left ventricle:  The cavity size was normal. Wall thickness was increased in a pattern of mild LVH. Hypokinesis of the anteroseptal wall, the apex, and the apical inferior wall. The estimated ejection fraction was 45%. Doppler parameters are consistent with abnormal left ventricular relaxation (grade 1 diastolic dysfunction).  ------------------------------------------------------------------- Aortic valve:   Trileaflet.  Doppler:   There was no stenosis. There was no regurgitation.    VTI ratio of LVOT to aortic valve: 0.81. Valve area (VTI): 2.29 cm^2. Indexed valve area (VTI): 1.07 cm^2/m^2. Peak velocity ratio of LVOT to aortic valve: 0.83. Valve area (Vmax): 2.36 cm^2. Indexed valve area (Vmax): 1.1 cm^2/m^2. Mean velocity ratio of LVOT to aortic valve: 0.87. Valve area (Vmean): 2.46 cm^2. Indexed valve area (Vmean): 1.15  cm^2/m^2. Mean gradient (S): 6 mm Hg. Peak gradient (S): 10 mm Hg.  ------------------------------------------------------------------- Aorta:  Aortic root: The aortic root was normal in size. Ascending aorta: The ascending aorta was normal in size.  ------------------------------------------------------------------- Mitral valve:   Normal thickness leaflets .  Doppler:   There was no evidence for stenosis.   There was no significant regurgitation.    Valve area by pressure half-time: 2.42 cm^2. Indexed valve area by pressure half-time: 1.13 cm^2/m^2. Indexed valve area by continuity equation (using LVOT flow): 1.19 cm^2/m^2.    Mean gradient (D): 3 mm Hg.  ------------------------------------------------------------------- Left atrium:  The atrium was normal in size.  ------------------------------------------------------------------- Right ventricle:  The cavity size was normal. Systolic function was normal.  ------------------------------------------------------------------- Pulmonic valve:    Structurally normal valve.   Cusp separation was normal.  Doppler:  Transvalvular velocity was within the normal range. There was no regurgitation.  ------------------------------------------------------------------- Tricuspid valve:   Doppler:  There was trivial regurgitation.   ------------------------------------------------------------------- Right atrium:  The atrium was normal in size.  ------------------------------------------------------------------- Pericardium:  There was no pericardial effusion.  ------------------------------------------------------------------- Systemic veins: Inferior vena cava: The vessel was normal in size. The respirophasic diameter changes were in the normal range (>= 50%), consistent with normal central venous pressure.  ------------------------------------------------------------------- Measurements   Left ventricle                            Value          Reference  LV ID, ED, PLAX chordal          (L)     40    mm       43 - 52  LV ID, ES, PLAX chordal                  27    mm       23 - 38  LV fx shortening, PLAX chordal           33    %        >=29  LV PW thickness, ED                      12  mm       ----------  IVS/LV PW ratio, ED                      1              <=1.3  Stroke volume, 2D                        57    ml       ----------  Stroke volume/bsa, 2D                    27    ml/m^2   ----------  LV ejection fraction, 1-p A4C            50    %        ----------  LV end-diastolic volume, 2-p             44    ml       ----------  LV end-systolic volume, 2-p              21    ml       ----------  LV ejection fraction, 2-p                53    %        ----------  Stroke volume, 2-p                       23    ml       ----------  LV end-diastolic volume/bsa, 2-p         21    ml/m^2   ----------  LV end-systolic volume/bsa, 2-p          10    ml/m^2   ----------  Stroke volume/bsa, 2-p                   10.9  ml/m^2   ----------  LV e&', lateral                           5.33  cm/s     ----------  LV E/e&', lateral                         12.01          ----------  LV e&', medial                            4.03  cm/s     ----------  LV E/e&', medial                          15.88          ----------  LV e&', average                           4.68  cm/s     ----------  LV E/e&', average                         13.68          ----------    Ventricular septum  Value          Reference  IVS thickness, ED                        12    mm       ----------    LVOT                                     Value          Reference  LVOT ID, S                               19    mm       ----------  LVOT area                                2.84  cm^2     ----------  LVOT peak velocity, S                    134   cm/s     ----------  LVOT mean velocity, S                    98.7  cm/s      ----------  LVOT VTI, S                              20.1  cm       ----------  LVOT peak gradient, S                    7     mm Hg    ----------    Aortic valve                             Value          Reference  Aortic valve peak velocity, S            161   cm/s     ----------  Aortic valve mean velocity, S            114   cm/s     ----------  Aortic valve VTI, S                      24.9  cm       ----------  Aortic mean gradient, S                  6     mm Hg    ----------  Aortic peak gradient, S                  10    mm Hg    ----------  VTI ratio, LVOT/AV                       0.81           ----------  Aortic valve area, VTI                   2.29  cm^2     ----------  Aortic valve area/bsa, VTI               1.07  cm^2/m^2 ----------  Velocity ratio, peak, LVOT/AV            0.83           ----------  Aortic valve area, peak velocity         2.36  cm^2     ----------  Aortic valve area/bsa, peak              1.1   cm^2/m^2 ----------  velocity  Velocity ratio, mean, LVOT/AV            0.87           ----------  Aortic valve area, mean velocity         2.46  cm^2     ----------  Aortic valve area/bsa, mean              1.15  cm^2/m^2 ----------  velocity    Aorta                                    Value          Reference  Aortic root ID, ED                       29    mm       ----------    Left atrium                              Value          Reference  LA ID, A-P, ES                           29    mm       ----------  LA ID/bsa, A-P                           1.35  cm/m^2   <=2.2  LA volume, S                             23.3  ml       ----------  LA volume/bsa, S                         10.9  ml/m^2   ----------  LA volume, ES, 1-p A4C                   23.5  ml       ----------  LA volume/bsa, ES, 1-p A4C               11    ml/m^2   ----------  LA volume, ES, 1-p A2C                   22.1  ml       ----------  LA volume/bsa, ES, 1-p A2C               10.3   ml/m^2   ----------    Mitral valve  Value          Reference  Mitral E-wave peak velocity              64    cm/s     ----------  Mitral A-wave peak velocity              125   cm/s     ----------  Mitral mean velocity, D                  73.6  cm/s     ----------  Mitral deceleration time                 197   ms       150 - 230  Mitral pressure half-time                58    ms       ----------  Mitral mean gradient, D                  3     mm Hg    ----------  Mitral E/A ratio, peak                   0.5            ----------  Mitral valve area, PHT, DP               2.42  cm^2     ----------  Mitral valve area/bsa, PHT, DP           1.13  cm^2/m^2 ----------  Mitral valve area/bsa, LVOT              1.19  cm^2/m^2 ----------  continuity  Mitral annulus VTI, D                    22.4  cm       ----------    Pulmonary arteries                       Value          Reference  PA pressure, S, DP                       28    mm Hg    <=30    Tricuspid valve                          Value          Reference  Tricuspid regurg peak velocity           251   cm/s     ----------  Tricuspid peak RV-RA gradient            25    mm Hg    ----------    Right atrium                             Value          Reference  RA ID, S-I, ES, A4C                      41.2  mm       34 - 49  RA area, ES, A4C                 (  L)     7.96  cm^2     8.3 - 19.5  RA volume, ES, A/L                       12.3  ml       ----------  RA volume/bsa, ES, A/L                   5.7   ml/m^2   ----------    Right ventricle                          Value          Reference  RV ID, ED, PLAX                          32    mm       19 - 38  TAPSE                                    18.4  mm       ----------  RV s&', lateral, S                        13.8  cm/s     ----------    Pulmonic valve                           Value          Reference  Pulmonic valve peak velocity, S          168   cm/s      ----------  Legend: (L)  and  (H)  mark values outside specified reference range.  ------------------------------------------------------------------- Prepared and Electronically Authenticated by  Loralie Champagne, M.D. 2018-06-24T14:59:26   Assessment/Plan:  I have personally reviewed and interpreted her echo and cath studies. This 61 year old woman with multiple cardiac risk factors presents with recent onset of anginal symptoms and a non-STEMI due to high grade LAD/diagonal stenosis seen on catheterization. Her LVEF has decreased to 45% from normal in 2014. I agree that the best treatment option for her is CABG to the LAD and diagonal. I discussed the operative procedure with the patient and her husband including alternatives, benefits and risks; including but not limited to bleeding, blood transfusion, infection, stroke, myocardial infarction, graft failure, heart block requiring a permanent pacemaker, organ dysfunction, and death.  Sara [name] understands and agrees to proceed.  We will schedule surgery for Friday.   I spent 60 minutes performing this consultation and > 50% of this time was spent face to face counseling and coordinating the care of this patient's coronary artery disease.  [name] 04/18/2017, 6:16 PM

## 2017-04-18 NOTE — Progress Notes (Signed)
[facility] Progress Note  Patient: Sara [name] TWS:[id number]   PCP: [name], MD DOB: 1956-05-11   DOA: 04/15/2017   DOS: 04/18/2017   Date of Service: the patient was seen and examined on 04/18/2017  Subjective: Complains about constipation. No other complaint. No chest pain or shortness of breath and nausea and vomiting.  Brief hospital course: Pt. with PMH of essential thrombocytosis, type II DM, gout, dyslipidemia, hypertension, OSA, PAD, renal artery stenosis with PTCA on right side; admitted on 04/15/2017, presented with complaint of chest pain, was found to have non-STEMI. Currently further plan is follow-up on recommendation from cardiothoracic surgery  Assessment and Plan: 1. Non-STEMI. Troponin peaked to 21, now trending downward. Cardiology consulted. EF 45% with hypokinesis. S/P cardiac catheterization, shows LAD as well as Didronel disease. Recommendation is for Age. Cardiac thoracic surgery consulted. Tentatively scheduled on 04/21/2017. Continue heparin at present. Continue 81 mg aspirin, nitroglycerin.  Patient's [name] is associated with angina as well as cardiotoxicity. Unclear whether this is the reason but may need adjustment under treatment  2. Essential thrombocythemia. Continue aspirin, continue [name] [name] is associated with angina as well as cardiac toxicity. May require treatment alteration.  3. Hypertension Hold Avapro 75 mg by mouth daily. Start metoprolol 12.5 mg by mouth daily.  Monitor blood pressure, renal function and electrolytes..  4. Type 2 diabetes mellitus (HCC) Carbohydrate modified diet. CBG monitoring with insulin sliding scale while in the hospital.  5.  OSA (obstructive sleep apnea) Continue CPAP at bedtime.    6. Hyperlipidemia On atorvastatin 10 mg by mouth daily    7. Constipation. Bowel regimen initiated.  8. History of seizures. Patient follows up with neurology as an outpatient. Continue home  regimen.  Diet: cardiac diet DVT Prophylaxis: heparin  Advance goals of care discussion: full code  Family Communication: no family was present at bedside, at the time of interview.  Disposition:  Discharge to home.  Consultants: cardiology, cardiothoracic surgery Procedures: Echocardiogram , cardiac cath  Antibiotics: Anti-infectives    None       Objective: Physical Exam: Vitals:   04/18/17 0418 04/18/17 0814 04/18/17 1149 04/18/17 1608  BP: 129/66 (!) 144/92 126/76 138/74  Pulse: 84 92 84 84  Resp: 17 (!) 21 18 (!) 25  Temp: 98.4 F (36.9 C) 98 F (36.7 C) 98.4 F (36.9 C) 98 F (36.7 C)  TempSrc: Oral Oral Oral Oral  SpO2: 96% 96% 98% 97%  Weight: 99 kg (218 lb 3.2 oz)     Height:        Intake/Output Summary (Last 24 hours) at 04/18/17 1904 Last data filed at 04/18/17 1349  Gross per 24 hour  Intake           915.53 ml  Output              800 ml  Net           115.53 ml   Filed Weights   04/15/17 1450 04/17/17 0500 04/18/17 0418  Weight: 99.8 kg (220 lb) 98.5 kg (217 lb 1.6 oz) 99 kg (218 lb 3.2 oz)   General: Appear in mild distress, no Rash; Oral Mucosa moist. no Abnormal Mass Or lumps Cardiovascular: S1 and S2 Present, no Murmur, Respiratory: normal respiratory effort, Bilateral Air entry present and Clear to Auscultation, no Crackles, no wheezes Abdomen: Bowel Sound present, Soft and no tenderness, no hernia Extremities: no Pedal edema, no calf tenderness Neurology: Alert, Awake and Oriented to Time, Place and Person.  affect appropriate. Grossly no focal neuro deficit.   Data Reviewed: CBC:  Recent Labs Lab 04/12/17 1526 04/15/17 1506 04/16/17 0212 04/17/17 0450 04/18/17 0459  WBC 8.5 7.9 8.1 9.8 7.8  NEUTROABS 5.3  --   --   --   --   HGB 13.6 14.4 14.2 13.6 12.8  HCT 40.7 43.5 43.2 41.0 39.5  MCV 88.5 89.0 88.2 87.8 88.2  PLT 400 413* 383 326 459   Basic Metabolic Panel:  Recent Labs Lab 04/15/17 1624 04/16/17 0212  04/16/17 0600 04/17/17 0450 04/18/17 0459  NA 141 141  --  138 140  K 3.7 3.3*  --  3.5 3.8  CL 108 105  --  105 108  CO2 24 27  --  23 22  GLUCOSE 88 107*  --  96 100*  BUN 10 11  --  15 15  CREATININE 1.15* 1.22*  --  1.33* 1.22*  CALCIUM 9.1 9.2  --  9.0 8.9  MG  --   --  2.1 2.1  --   PHOS  --   --  3.7  --   --     Liver Function Tests:  Recent Labs Lab 04/16/17 0212  AST 70*  ALT 25  ALKPHOS 72  BILITOT 0.7  PROT 7.0  ALBUMIN 3.3*   No results for input(s): LIPASE, AMYLASE in the last 168 hours. No results for input(s): AMMONIA in the last 168 hours. Coagulation Profile:  Recent Labs Lab 04/15/17 1900 04/17/17 0450  INR 1.09 1.03   Cardiac Enzymes:  Recent Labs Lab 04/16/17 0212 04/16/17 0937 04/16/17 1604 04/17/17 0450  TROPONINI 3.83* 5.57* 21.64* 15.84*   BNP (last 3 results) No results for input(s): PROBNP in the last 8760 hours. CBG:  Recent Labs Lab 04/17/17 1637 04/17/17 2116 04/18/17 0813 04/18/17 1146 04/18/17 1614  GLUCAP 90 112* 103* 100* 94   Studies: No results found.  Scheduled Meds: . anagrelide  1 mg Oral Daily  . aspirin EC  81 mg Oral Daily  . atorvastatin  10 mg Oral q1800  . cholecalciferol  2,000 Units Oral Daily  . insulin aspart  0-9 Units Subcutaneous TID WC  . isosorbide mononitrate  15 mg Oral Daily  . lacosamide  100 mg Oral Daily   And  . lacosamide  150 mg Oral QHS  . metoprolol tartrate  25 mg Oral BID  . nitroGLYCERIN  1 inch Topical Q6H  . polyethylene glycol  17 g Oral Daily  . senna-docusate  1 tablet Oral BID  . sodium chloride flush  3 mL Intravenous Q12H   Continuous Infusions: . sodium chloride    . heparin 1,150 Units/hr (04/18/17 0039)   PRN Meds: sodium chloride, acetaminophen, ALPRAZolam, ondansetron (ZOFRAN) IV, simethicone, sodium chloride flush  Time spent: 35 minutes  Author: Berle Mull, MD [facility] Pager: [phone] 04/18/2017 7:04 PM  If 7PM-7AM, please  contact night-coverage at [email or web address], password [facility]

## 2017-04-18 NOTE — Progress Notes (Signed)
ANTICOAGULATION CONSULT NOTE - Follow Up Consult  Pharmacy Consult for Heparin  Indication: chest pain/ACS  Allergies  Allergen Reactions  . Sulfa Antibiotics Itching    Patient Measurements: Height: 5\' 2"  (157.5 cm) Weight: 218 lb 3.2 oz (99 kg) IBW/kg (Calculated) : 50.1  Vital Signs: Temp: 98 F (36.7 C) (06/26 0814) Temp Source: Oral (06/26 0814) BP: 144/92 (06/26 0814) Pulse Rate: 92 (06/26 0814)  Labs:  Recent Labs  04/15/17 1900  04/16/17 9021 04/16/17 1155 04/16/17 1604 04/17/17 0450 04/17/17 2309 04/18/17 0459  HGB  --   --  14.2  --   --  13.6  --  12.8  HCT  --   --  43.2  --   --  41.0  --  39.5  PLT  --   --  383  --   --  326  --  316  APTT 139*  --   --   --   --   --   --   --   LABPROT 14.1  --   --   --   --  13.5  --   --   INR 1.09  --   --   --   --  1.03  --   --   HEPARINUNFRC 1.09*  --  0.36 0.44  --  0.33 0.25* 0.36  CREATININE  --   --  1.22*  --   --  1.33*  --  1.22*  TROPONINI  --   < > 3.83* 5.57* 21.64* 15.84*  --   --   < > = values in this interval not displayed.  Estimated Creatinine Clearance: 52.6 mL/min (A) (by C-G formula based on SCr of 1.22 mg/dL (H)).   Assessment: Heparin for CP/elevated troponin. Not an anticoagulation PTA. Noted to be on anagrelide for thrombocythemia. Will need to this closely when used with anticoagulation/antiplatelets for increased risk  Heparin level therapeutic this AM, CBC stable  CABG planned  Goal of Therapy:  Heparin level 0.3-0.7 units/ml Monitor platelets by anticoagulation protocol: Yes   Plan:  Continue heparin at 1150 units / hr Daily labs  Thank you Anette Guarneri, PharmD [phone]  04/18/2017

## 2017-04-18 NOTE — Progress Notes (Signed)
[id number] Came to see pt to begin pre op ed. Dr. Cyndia Bent in to see pt. Did not walk with pt as she had some neck discomfort going to bathroom this morning. Please advise if we are to walk prior to surgery. Gave [facility] care guide as pt had [facility] booklet already. Wrote down how to view pre op video. Discussed sternal precautions, and importance of mobility and IS. Helped pt to use IS correctly. Husband will be available after discharge 24/7. Demonstrated getting up and down without use of arms. Graylon Good RN BSN 04/18/2017 3:06 PM

## 2017-04-18 NOTE — Progress Notes (Signed)
Responded to [facility] Consult to assist with creating AD.  Notary not available at time of completion.  Patient would like done on tomorrow 04/19/17 .  Patient having surgery on Friday. Patient in good spirit and husband at bedside. Chaplain available as needed.    04/18/17 1519  Clinical Encounter Type  Visited With Patient and family together;Health care provider  Visit Type Initial;Spiritual support  Referral From Nurse  Spiritual Encounters  Spiritual Needs Literature;Emotional  Stress Factors  Patient Stress Factors None identified  Family Stress Factors None identified  Advance Directives (For Healthcare)  Does Patient Have a Medical Advance Directive? No  Would patient like information on creating a medical advance directive? Yes (Inpatient - patient requests chaplain consult to create a medical advance directive)    Cristopher Peru, [facility], Pager [phone]

## 2017-04-19 DIAGNOSIS — E119 Type 2 diabetes mellitus without complications: Secondary | ICD-10-CM

## 2017-04-19 LAB — GLUCOSE, CAPILLARY
Glucose-Capillary: 100 mg/dL — ABNORMAL HIGH (ref 65–99)
Glucose-Capillary: 93 mg/dL (ref 65–99)
Glucose-Capillary: 91 mg/dL (ref 65–99)
Glucose-Capillary: 110 mg/dL — ABNORMAL HIGH (ref 65–99)

## 2017-04-19 LAB — URINALYSIS, ROUTINE W REFLEX MICROSCOPIC
Bilirubin Urine: NEGATIVE
Glucose, UA: NEGATIVE mg/dL
HGB URINE DIPSTICK: NEGATIVE
Ketones, ur: NEGATIVE mg/dL
Leukocytes, UA: NEGATIVE
Nitrite: NEGATIVE
PH: 6 (ref 5.0–8.0)
Protein, ur: NEGATIVE mg/dL
SPECIFIC GRAVITY, URINE: 1.006 (ref 1.005–1.030)

## 2017-04-19 LAB — BASIC METABOLIC PANEL
Anion gap: 9 (ref 5–15)
BUN: 16 mg/dL (ref 6–20)
CO2: 23 mmol/L (ref 22–32)
Calcium: 9 mg/dL (ref 8.9–10.3)
Chloride: 109 mmol/L (ref 101–111)
Creatinine, Ser: 1.2 mg/dL — ABNORMAL HIGH (ref 0.44–1.00)
GFR calc Af Amer: 55 mL/min — ABNORMAL LOW (ref >60)
GFR, EST NON AFRICAN AMERICAN: 47 mL/min — AB (ref >60)
Glucose, Bld: 93 mg/dL (ref 65–99)
Potassium: 4.2 mmol/L (ref 3.5–5.1)
Sodium: 141 mmol/L (ref 135–145)

## 2017-04-19 LAB — CBC
HCT: 38.9 % (ref 36.0–46.0)
HEMOGLOBIN: 12.5 g/dL (ref 12.0–15.0)
MCH: 28.5 pg (ref 26.0–34.0)
MCHC: 32.1 g/dL (ref 30.0–36.0)
MCV: 88.8 fL (ref 78.0–100.0)
Platelets: 313 10*3/uL (ref 150–400)
RBC: 4.38 MIL/uL (ref 3.87–5.11)
RDW: 13.6 % (ref 11.5–15.5)
WBC: 7.5 10*3/uL (ref 4.0–10.5)

## 2017-04-19 LAB — SURGICAL PCR SCREEN
MRSA, PCR: NEGATIVE
STAPHYLOCOCCUS AUREUS: NEGATIVE

## 2017-04-19 LAB — HEPARIN LEVEL (UNFRACTIONATED): Heparin Unfractionated: 0.52 IU/mL (ref 0.30–0.70)

## 2017-04-19 NOTE — Progress Notes (Signed)
[facility] PROGRESS NOTE  Sara [name] ZSW:[id number] DOB: 07/05/1956 DOA: 04/15/2017  PCP: [name], MD  Brief History/Interval Summary: 61 year old African-American female with a past medical history of essential thrombocytosis, history of Diabetes, gout, dyslipidemia, hypertension, sleep apnea, peripheral artery disease, presented with chest pain. She was found to have non-STEMI. Patient found to have severe bifurcation disease in the LAD and diagonal. Cardiothoracic surgery consultation was recommended for CABG.  Reason for Visit: Non-STEMI  Consultants: Cardiology. Cardiothoracic surgery.  Procedures:  Cardiac catheterization 6/25 Conclusion     1st Diag lesion, 75 %stenosed.  Prox LAD lesion, 90 %stenosed.  Ost 1st Diag to 1st Diag lesion, 80 %stenosed.  Dist LAD-2 lesion, 40 %stenosed.  Dist LAD-1 lesion, 75 %stenosed.  Ost 1st Mrg to 1st Mrg lesion, 25 %stenosed.  LV end diastolic pressure is normal.  There is no aortic valve stenosis.  No significant disease in the nondominant RCA or dominant circumflex.   Severe complex bifurcation disease in the proximal to mid LAD at the origin of the first diagonal.  Severe disease in the diagonal as well.  Disease further in the mid LAD as well.    Transthoracic echocardiogram Study Conclusions  - Left ventricle: The cavity size was normal. Wall thickness was   increased in a pattern of mild LVH. Hypokinesis of the   anteroseptal wall, the apex, and the apical inferior wall. The   estimated ejection fraction was 45%. Doppler parameters are   consistent with abnormal left ventricular relaxation (grade 1   diastolic dysfunction). - Aortic valve: There was no stenosis. - Mitral valve: There was no significant regurgitation. - Right ventricle: The cavity size was normal. Systolic function   was normal. - Tricuspid valve: Peak RV-RA gradient (S): 25 mm Hg. - Pulmonary arteries: PA peak pressure: 28  mm Hg (S). - Inferior vena cava: The vessel was normal in size. The   respirophasic diameter changes were in the normal range (>= 50%),   consistent with normal central venous pressure.  Impressions:  - Normal LV size with mild LV hypertrophy. Hypokinesis of the   anteroseptal wall, the apex, and the apical inferior wall. EF   45%. Normal RV size and systolic function. No significant   valvular abnormalities.   Antibiotics: None  Subjective/Interval History: Patient feels well this morning. Denies any chest pain or shortness of breath. No nausea or vomiting  ROS: No headaches  Objective:  Vital Signs  Vitals:   04/18/17 1608 04/18/17 1941 04/19/17 0632 04/19/17 1035  BP: 138/74 135/65 (!) 159/88 (!) 153/76  Pulse: 84 92 84 90  Resp: (!) 25 18 (!) 26 (!) 22  Temp: 98 F (36.7 C) 98.7 F (37.1 C) 97.5 F (36.4 C)   TempSrc: Oral Oral Oral   SpO2: 97% 95% 95% 98%  Weight:   99.5 kg (219 lb 4.8 oz)   Height:        Intake/Output Summary (Last 24 hours) at 04/19/17 1142 Last data filed at 04/19/17 0600  Gross per 24 hour  Intake            948.5 ml  Output              400 ml  Net            548.5 ml   Filed Weights   04/17/17 0500 04/18/17 0418 04/19/17 0632  Weight: 98.5 kg (217 lb 1.6 oz) 99 kg (218 lb 3.2 oz) 99.5 kg (219 lb 4.8  oz)    General appearance: alert, cooperative, appears stated age and no distress Resp: clear to auscultation bilaterally Cardio: S1, S2 is normal, regular. Systolic murmur appreciated over the aortic area. History is from. No murmurs or bruits. GI: soft, non-tender; bowel sounds normal; no masses,  no organomegaly Extremities: extremities normal, atraumatic, no cyanosis or edema Neurologic: No focal deficit  Lab Results:  Data Reviewed: I have personally reviewed following labs and imaging studies  CBC:  Recent Labs Lab 04/12/17 1526 04/15/17 1506 04/16/17 0212 04/17/17 0450 04/18/17 0459 04/19/17 0456  WBC 8.5 7.9  8.1 9.8 7.8 7.5  NEUTROABS 5.3  --   --   --   --   --   HGB 13.6 14.4 14.2 13.6 12.8 12.5  HCT 40.7 43.5 43.2 41.0 39.5 38.9  MCV 88.5 89.0 88.2 87.8 88.2 88.8  PLT 400 413* 383 326 316 381    Basic Metabolic Panel:  Recent Labs Lab 04/15/17 1624 04/16/17 0212 04/16/17 0600 04/17/17 0450 04/18/17 0459 04/19/17 0456  NA 141 141  --  138 140 141  K 3.7 3.3*  --  3.5 3.8 4.2  CL 108 105  --  105 108 109  CO2 24 27  --  23 22 23   GLUCOSE 88 107*  --  96 100* 93  BUN 10 11  --  15 15 16   CREATININE 1.15* 1.22*  --  1.33* 1.22* 1.20*  CALCIUM 9.1 9.2  --  9.0 8.9 9.0  MG  --   --  2.1 2.1  --   --   PHOS  --   --  3.7  --   --   --     GFR: Estimated Creatinine Clearance: 53.6 mL/min (A) (by C-G formula based on SCr of 1.2 mg/dL (H)).  Liver Function Tests:  Recent Labs Lab 04/16/17 0212  AST 70*  ALT 25  ALKPHOS 72  BILITOT 0.7  PROT 7.0  ALBUMIN 3.3*    Coagulation Profile:  Recent Labs Lab 04/15/17 1900 04/17/17 0450  INR 1.09 1.03    Cardiac Enzymes:  Recent Labs Lab 04/16/17 0212 04/16/17 0937 04/16/17 1604 04/17/17 0450  TROPONINI 3.83* 5.57* 21.64* 15.84*    CBG:  Recent Labs Lab 04/18/17 1146 04/18/17 1614 04/18/17 2107 04/19/17 0752 04/19/17 1137  GLUCAP 100* 94 125* 100* 93    Lipid Profile:  Recent Labs  04/17/17 1000  CHOL 102  HDL 38*  LDLCALC 56  TRIG 40  CHOLHDL 2.7     Recent Results (from the past 240 hour(s))  MRSA PCR Screening     Status: None   Collection Time: 04/16/17  1:43 AM  Result Value Ref Range Status   MRSA by PCR NEGATIVE NEGATIVE Final    Comment:        The GeneXpert MRSA Assay (FDA approved for NASAL specimens only), is one component of a comprehensive MRSA colonization surveillance program. It is not intended to diagnose MRSA infection nor to guide or monitor treatment for MRSA infections.       Radiology Studies: No results found.   Medications:  Scheduled: . anagrelide   1 mg Oral Daily  . aspirin EC  81 mg Oral Daily  . atorvastatin  10 mg Oral q1800  . cholecalciferol  2,000 Units Oral Daily  . insulin aspart  0-9 Units Subcutaneous TID WC  . isosorbide mononitrate  15 mg Oral Daily  . lacosamide  100 mg Oral Daily   And  .  lacosamide  150 mg Oral QHS  . metoprolol tartrate  25 mg Oral BID  . nitroGLYCERIN  1 inch Topical Q6H  . polyethylene glycol  17 g Oral Daily  . senna-docusate  1 tablet Oral BID  . sodium chloride flush  3 mL Intravenous Q12H   Continuous: . sodium chloride    . heparin 1,150 Units/hr (04/18/17 1942)   WSF:KCLEXN chloride, acetaminophen, ALPRAZolam, ondansetron (ZOFRAN) IV, simethicone, sodium chloride flush  Assessment/Plan:  Principal Problem:   ACS (acute coronary syndrome) (HCC) Active Problems:   Essential thrombocythemia (HCC)   Hypertension   Type 2 diabetes mellitus (HCC)   OSA (obstructive sleep apnea)   Hyperlipidemia   Chest pain   NSTEMI (non-ST elevated myocardial infarction) (HCC)   Elevated troponin    NSTEMI/Coronary artery disease. Patient underwent cardiac catheterization. Plan is for CABG. Cardiothoracic surgery has been consulted. Patient is currently on heparin, aspirin and nitrates. Patient is on beta blockers. Surgery is planned for this Friday. Patient takes [name] for history of essential thrombocytosis. This apparently can cause cardiac toxicity. Cardiology to discuss with hematology.  History of essential thrombocythemia Continue aspirin. Patient is on [name].  History of essential hypertension.  Holding Avapro. Patient is on beta blocker. Monitor blood pressures closely.  History of type 2 diabetes. Patient is on sliding scale coverage. HbA1c 6.6.  History of obstructive sleep apnea. Continue CPAP at bedtime.  History of hyperlipidemia. Patient is on atorvastatin.  History of seizures Followed by neurology. It appears that patient is on [name] at home. Continued.  DVT  Prophylaxis: On IV heparin    Code Status: Full code  Family Communication: Discussed with the patient  Disposition Plan: Await CABG    LOS: 4 days   [facility] Pager [phone] 04/19/2017, 11:42 AM  If 7PM-7AM, please contact night-coverage at [email or web address], password [facility]

## 2017-04-19 NOTE — Progress Notes (Signed)
ANTICOAGULATION CONSULT NOTE - Follow Up Consult  Pharmacy Consult for Heparin  Indication: chest pain/ACS  Allergies  Allergen Reactions  . Sulfa Antibiotics Itching    Patient Measurements: Height: 5\' 2"  (157.5 cm) Weight: 219 lb 4.8 oz (99.5 kg) IBW/kg (Calculated) : 50.1  Vital Signs: Temp: 97.5 F (36.4 C) (06/27 0632) Temp Source: Oral (06/27 9450) BP: 159/88 (06/27 3888) Pulse Rate: 84 (06/27 0632)  Labs:  Recent Labs  04/16/17 1604  04/17/17 0450 04/17/17 2309 04/18/17 0459 04/19/17 0456  HGB  --   < > 13.6  --  12.8 12.5  HCT  --   --  41.0  --  39.5 38.9  PLT  --   --  326  --  316 313  LABPROT  --   --  13.5  --   --   --   INR  --   --  1.03  --   --   --   HEPARINUNFRC  --   < > 0.33 0.25* 0.36 0.52  CREATININE  --   --  1.33*  --  1.22* 1.20*  TROPONINI 21.64*  --  15.84*  --   --   --   < > = values in this interval not displayed.  Estimated Creatinine Clearance: 53.6 mL/min (A) (by C-G formula based on SCr of 1.2 mg/dL (H)).   Assessment: Heparin for CP/elevated troponin. Not an anticoagulation PTA. Noted to be on anagrelide for thrombocythemia. Will need to this closely when used with anticoagulation/antiplatelets for increased risk  Heparin level therapeutic this AM, CBC stable  CABG planned for 6/29  Goal of Therapy:  Heparin level 0.3-0.7 units/ml Monitor platelets by anticoagulation protocol: Yes   Plan:  Continue heparin at 1150 units / hr Daily labs  Thank you Anette Guarneri, PharmD [phone]  04/19/2017

## 2017-04-19 NOTE — Progress Notes (Signed)
CPAP available for pt use at bedside.  Pt has declined use.

## 2017-04-19 NOTE — Progress Notes (Signed)
Progress Note  Patient Name: Sara [name] Date of Encounter: 04/19/2017  Primary Cardiologist: Croitoru  Subjective   Feeling well. No chest pain or shortness of breath. Sitting up at the bedside.  Inpatient Medications    Scheduled Meds: . anagrelide  1 mg Oral Daily  . aspirin EC  81 mg Oral Daily  . atorvastatin  10 mg Oral q1800  . cholecalciferol  2,000 Units Oral Daily  . insulin aspart  0-9 Units Subcutaneous TID WC  . isosorbide mononitrate  15 mg Oral Daily  . lacosamide  100 mg Oral Daily   And  . lacosamide  150 mg Oral QHS  . metoprolol tartrate  25 mg Oral BID  . nitroGLYCERIN  1 inch Topical Q6H  . polyethylene glycol  17 g Oral Daily  . senna-docusate  1 tablet Oral BID  . sodium chloride flush  3 mL Intravenous Q12H   Continuous Infusions: . sodium chloride    . heparin 1,150 Units/hr (04/18/17 1942)   PRN Meds: sodium chloride, acetaminophen, ALPRAZolam, ondansetron (ZOFRAN) IV, simethicone, sodium chloride flush   Vital Signs    Vitals:   04/18/17 1608 04/18/17 1941 04/19/17 0632 04/19/17 1035  BP: 138/74 135/65 (!) 159/88 (!) 153/76  Pulse: 84 92 84 90  Resp: (!) 25 18 (!) 26 (!) 22  Temp: 98 F (36.7 C) 98.7 F (37.1 C) 97.5 F (36.4 C)   TempSrc: Oral Oral Oral   SpO2: 97% 95% 95% 98%  Weight:   219 lb 4.8 oz (99.5 kg)   Height:        Intake/Output Summary (Last 24 hours) at 04/19/17 1328 Last data filed at 04/19/17 0600  Gross per 24 hour  Intake            948.5 ml  Output              400 ml  Net            548.5 ml   Filed Weights   04/17/17 0500 04/18/17 0418 04/19/17 3545  Weight: 217 lb 1.6 oz (98.5 kg) 218 lb 3.2 oz (99 kg) 219 lb 4.8 oz (99.5 kg)    Telemetry    Normal sinus rhythm - Personally Reviewed   Physical Exam  Alert and oriented, pleasant obese woman in no distress GEN: No acute distress.   Neck: No JVD Cardiac: RRR, grade 2/6 systolic ejection murmur at the right upper sternal border    Respiratory: Clear to auscultation bilaterally. GI: Soft, nontender, non-distended  MS: No edema; No deformity. Neuro:  Nonfocal  Psych: Normal affect   Labs    Chemistry Recent Labs Lab 04/16/17 0212 04/17/17 0450 04/18/17 0459 04/19/17 0456  NA 141 138 140 141  K 3.3* 3.5 3.8 4.2  CL 105 105 108 109  CO2 27 23 22 23   GLUCOSE 107* 96 100* 93  BUN 11 15 15 16   CREATININE 1.22* 1.33* 1.22* 1.20*  CALCIUM 9.2 9.0 8.9 9.0  PROT 7.0  --   --   --   ALBUMIN 3.3*  --   --   --   AST 70*  --   --   --   ALT 25  --   --   --   ALKPHOS 72  --   --   --   BILITOT 0.7  --   --   --   GFRNONAA 46* 42* 46* 47*  GFRAA 54* 49* 54* 55*  ANIONGAP  9 10 10 9      Hematology Recent Labs Lab 04/17/17 0450 04/18/17 0459 04/19/17 0456  WBC 9.8 7.8 7.5  RBC 4.67 4.48 4.38  HGB 13.6 12.8 12.5  HCT 41.0 39.5 38.9  MCV 87.8 88.2 88.8  MCH 29.1 28.6 28.5  MCHC 33.2 32.4 32.1  RDW 13.6 13.6 13.6  PLT 326 316 313    Cardiac Enzymes Recent Labs Lab 04/16/17 0212 04/16/17 0937 04/16/17 1604 04/17/17 0450  TROPONINI 3.83* 5.57* 21.64* 15.84*    Recent Labs Lab 04/15/17 1517 04/15/17 2237  TROPIPOC 0.11* 1.03*     BNPNo results for input(s): BNP, PROBNP in the last 168 hours.   DDimer No results for input(s): DDIMER in the last 168 hours.   Radiology    No results found.  Cardiac Studies   2-D echocardiogram: Study Conclusions  - Left ventricle: The cavity size was normal. Wall thickness was   increased in a pattern of mild LVH. Hypokinesis of the   anteroseptal wall, the apex, and the apical inferior wall. The   estimated ejection fraction was 45%. Doppler parameters are   consistent with abnormal left ventricular relaxation (grade 1   diastolic dysfunction). - Aortic valve: There was no stenosis. - Mitral valve: There was no significant regurgitation. - Right ventricle: The cavity size was normal. Systolic function   was normal. - Tricuspid valve: Peak  RV-RA gradient (S): 25 mm Hg. - Pulmonary arteries: PA peak pressure: 28 mm Hg (S). - Inferior vena cava: The vessel was normal in size. The   respirophasic diameter changes were in the normal range (>= 50%),   consistent with normal central venous pressure.  Impressions:  - Normal LV size with mild LV hypertrophy. Hypokinesis of the   anteroseptal wall, the apex, and the apical inferior wall. EF   45%. Normal RV size and systolic function. No significant   valvular abnormalities.  Cardiac catheterization: Conclusion     1st Diag lesion, 75 %stenosed.  Prox LAD lesion, 90 %stenosed.  Ost 1st Diag to 1st Diag lesion, 80 %stenosed.  Dist LAD-2 lesion, 40 %stenosed.  Dist LAD-1 lesion, 75 %stenosed.  Ost 1st Mrg to 1st Mrg lesion, 25 %stenosed.  LV end diastolic pressure is normal.  There is no aortic valve stenosis.  No significant disease in the nondominant RCA or dominant circumflex.   Severe complex bifurcation disease in the proximal to mid LAD at the origin of the first diagonal.  Severe disease in the diagonal as well.  Disease further in the mid LAD as well.   Plan cardiac surgery consult for CABG, with grafts to LAD and diagonal.       Patient Profile     61 y.o. female with diabetes, peripheral arterial disease, hypertension, and hyperlipidemia who presented with non-ST elevation infarction.  Assessment & Plan    1. Non-ST elevation infarction: Patient found to have severe diffuse LAD/diagonal stenosis with associated mild segmental LV dysfunction. Considering her diabetes and complex bifurcational disease, CABG is recommended. She remained stable on aspirin and IV heparin. Medical program is reviewed. Plans for coronary bypass surgery this Friday.  2. Type 2 diabetes: Blood glucoses remain well controlled on sliding scale insulin.  3. Essential thrombocytosis: Discussed her case with hematology. She is treated with anagrelide. Felt best to continue  this as it is not likely to be associated with her current cardiac condition.   4. CKD 3: stable creatinine.   Deatra James, MD  04/19/2017, 1:28 PM

## 2017-04-19 NOTE — Plan of Care (Signed)
Problem: Education: Goal: Knowledge of [facility] Education information/materials will improve Outcome: Progressing Worked with patient on proper usage of incentive spirometer.   Problem: Safety: Goal: Ability to remain free from injury will improve Outcome: Progressing Patient calls for assistance appropriately. Standby assist with monitor/IV pole.  Problem: Pain Managment: Goal: General experience of comfort will improve Outcome: Progressing Patient has had no complaints of pain.   Problem: Physical Regulation: Goal: Ability to maintain clinical measurements within normal limits will improve Outcome: Progressing Heparin gtts continues to run at 11.5. Plan is for CABG on Friday 6/29.   Problem: Bowel/Gastric: Goal: Will not experience complications related to bowel motility Outcome: Not Progressing Miralax and Senokot given this evening.

## 2017-04-19 NOTE — Progress Notes (Signed)
Pt back in Afib; HR=90-110.  BP stable.  Pt states she feels well, only slightly SOB.    2L Oxygen applied and EKG obtained; will continue to monitor.  Rosaria Ferries, PA aware.

## 2017-04-20 ENCOUNTER — Inpatient Hospital Stay (HOSPITAL_COMMUNITY): Payer: BC Managed Care – PPO

## 2017-04-20 DIAGNOSIS — Z0271 Encounter for disability determination: Secondary | ICD-10-CM

## 2017-04-20 DIAGNOSIS — G4733 Obstructive sleep apnea (adult) (pediatric): Secondary | ICD-10-CM

## 2017-04-20 LAB — PULMONARY FUNCTION TEST
DL/VA % pred: 123 %
DL/VA: 5.62 ml/min/mmHg/L
DLCO cor % pred: 92 %
DLCO cor: 20 ml/min/mmHg
DLCO unc % pred: 92 %
DLCO unc: 19.88 ml/min/mmHg
FEF 25-75 Post: 2.85 L/sec
FEF 25-75 Pre: 2.65 L/sec
FEF2575-%Change-Post: 7 %
FEF2575-%Pred-Post: 155 %
FEF2575-%Pred-Pre: 144 %
FEV1-%Change-Post: 2 %
FEV1-%Pred-Post: 110 %
FEV1-%Pred-Pre: 107 %
FEV1-Post: 2.04 L
FEV1-Pre: 2 L
FEV1FVC-%Change-Post: 0 %
FEV1FVC-%Pred-Pre: 109 %
FEV6-%Change-Post: 2 %
FEV6-%Pred-Post: 104 %
FEV6-%Pred-Pre: 101 %
FEV6-Post: 2.37 L
FEV6-Pre: 2.31 L
FEV6FVC-%Pred-Post: 104 %
FEV6FVC-%Pred-Pre: 104 %
FVC-%Change-Post: 2 %
FVC-%Pred-Post: 100 %
FVC-%Pred-Pre: 97 %
FVC-Post: 2.37 L
FVC-Pre: 2.31 L
Post FEV1/FVC ratio: 86 %
Post FEV6/FVC ratio: 100 %
Pre FEV1/FVC ratio: 86 %
Pre FEV6/FVC Ratio: 100 %
RV % pred: 133 %
RV: 2.59 L
TLC % pred: 106 %
TLC: 5.06 L

## 2017-04-20 LAB — CBC
HEMATOCRIT: 40.9 % (ref 36.0–46.0)
HEMOGLOBIN: 13.2 g/dL (ref 12.0–15.0)
MCH: 28.4 pg (ref 26.0–34.0)
MCHC: 32.3 g/dL (ref 30.0–36.0)
MCV: 88.1 fL (ref 78.0–100.0)
Platelets: 323 10*3/uL (ref 150–400)
RBC: 4.64 MIL/uL (ref 3.87–5.11)
RDW: 13.6 % (ref 11.5–15.5)
WBC: 7.3 10*3/uL (ref 4.0–10.5)

## 2017-04-20 LAB — BASIC METABOLIC PANEL
Anion gap: 8 (ref 5–15)
BUN: 16 mg/dL (ref 6–20)
CHLORIDE: 108 mmol/L (ref 101–111)
CO2: 25 mmol/L (ref 22–32)
Calcium: 9.4 mg/dL (ref 8.9–10.3)
Creatinine, Ser: 1.24 mg/dL — ABNORMAL HIGH (ref 0.44–1.00)
GFR calc Af Amer: 53 mL/min — ABNORMAL LOW (ref >60)
GFR calc non Af Amer: 46 mL/min — ABNORMAL LOW (ref >60)
GLUCOSE: 99 mg/dL (ref 65–99)
POTASSIUM: 4.3 mmol/L (ref 3.5–5.1)
Sodium: 141 mmol/L (ref 135–145)

## 2017-04-20 LAB — GLUCOSE, CAPILLARY
GLUCOSE-CAPILLARY: 106 mg/dL — AB (ref 65–99)
GLUCOSE-CAPILLARY: 91 mg/dL (ref 65–99)
Glucose-Capillary: 113 mg/dL — ABNORMAL HIGH (ref 65–99)
Glucose-Capillary: 115 mg/dL — ABNORMAL HIGH (ref 65–99)

## 2017-04-20 LAB — ABO/RH: ABO/RH(D): B POS

## 2017-04-20 LAB — TYPE AND SCREEN
ABO/RH(D): B POS
Antibody Screen: NEGATIVE

## 2017-04-20 LAB — HEPARIN LEVEL (UNFRACTIONATED): Heparin Unfractionated: 0.66 IU/mL (ref 0.30–0.70)

## 2017-04-20 MED ORDER — SODIUM CHLORIDE 0.9 % IV SOLN
INTRAVENOUS | Status: AC
  Administered 2017-04-21: .8 [IU]/h via INTRAVENOUS
  Filled 2017-04-20: qty 1

## 2017-04-20 MED ORDER — PAPAVERINE HCL 30 MG/ML IJ SOLN
INTRAMUSCULAR | Status: AC
  Administered 2017-04-21: 09:00:00
  Filled 2017-04-20: qty 2.5

## 2017-04-20 MED ORDER — TRANEXAMIC ACID 1000 MG/10ML IV SOLN
1.5000 mg/kg/h | INTRAVENOUS | Status: AC
  Administered 2017-04-21: 1.5 mg/kg/h via INTRAVENOUS
  Filled 2017-04-20: qty 25

## 2017-04-20 MED ORDER — VANCOMYCIN HCL 10 G IV SOLR
1500.0000 mg | INTRAVENOUS | Status: AC
  Administered 2017-04-21: 1500 mg via INTRAVENOUS
  Filled 2017-04-20: qty 1500

## 2017-04-20 MED ORDER — DIAZEPAM 5 MG PO TABS
5.0000 mg | ORAL_TABLET | Freq: Once | ORAL | Status: AC
  Administered 2017-04-21: 5 mg via ORAL
  Filled 2017-04-20: qty 1

## 2017-04-20 MED ORDER — CHLORHEXIDINE GLUCONATE CLOTH 2 % EX PADS
6.0000 | MEDICATED_PAD | Freq: Once | CUTANEOUS | Status: AC
  Administered 2017-04-21: 6 via TOPICAL

## 2017-04-20 MED ORDER — POTASSIUM CHLORIDE 2 MEQ/ML IV SOLN
80.0000 meq | INTRAVENOUS | Status: DC
  Filled 2017-04-20: qty 40

## 2017-04-20 MED ORDER — TEMAZEPAM 15 MG PO CAPS
15.0000 mg | ORAL_CAPSULE | Freq: Once | ORAL | Status: AC | PRN
  Administered 2017-04-20: 15 mg via ORAL
  Filled 2017-04-20: qty 1

## 2017-04-20 MED ORDER — CEFUROXIME SODIUM 750 MG IJ SOLR
750.0000 mg | INTRAMUSCULAR | Status: DC
  Filled 2017-04-20: qty 750

## 2017-04-20 MED ORDER — METOPROLOL TARTRATE 12.5 MG HALF TABLET
12.5000 mg | ORAL_TABLET | Freq: Once | ORAL | Status: AC
  Administered 2017-04-21: 12.5 mg via ORAL
  Filled 2017-04-20: qty 1

## 2017-04-20 MED ORDER — ALBUTEROL SULFATE (2.5 MG/3ML) 0.083% IN NEBU
2.5000 mg | INHALATION_SOLUTION | Freq: Once | RESPIRATORY_TRACT | Status: AC
  Administered 2017-04-20: 2.5 mg via RESPIRATORY_TRACT

## 2017-04-20 MED ORDER — CEFUROXIME SODIUM 1.5 G IV SOLR
1.5000 g | INTRAVENOUS | Status: AC
  Administered 2017-04-21: .75 g via INTRAVENOUS
  Administered 2017-04-21: 1.5 g via INTRAVENOUS
  Filled 2017-04-20: qty 1.5

## 2017-04-20 MED ORDER — CHLORHEXIDINE GLUCONATE CLOTH 2 % EX PADS
6.0000 | MEDICATED_PAD | Freq: Once | CUTANEOUS | Status: AC
  Administered 2017-04-20: 6 via TOPICAL

## 2017-04-20 MED ORDER — SODIUM CHLORIDE 0.9 % IV SOLN
30.0000 ug/min | INTRAVENOUS | Status: AC
  Administered 2017-04-21: 5 ug/min via INTRAVENOUS
  Filled 2017-04-20: qty 2

## 2017-04-20 MED ORDER — DEXMEDETOMIDINE HCL IN NACL 400 MCG/100ML IV SOLN
0.1000 ug/kg/h | INTRAVENOUS | Status: AC
  Administered 2017-04-21: .5 ug/kg/h via INTRAVENOUS
  Filled 2017-04-20: qty 100

## 2017-04-20 MED ORDER — NITROGLYCERIN IN D5W 200-5 MCG/ML-% IV SOLN
2.0000 ug/min | INTRAVENOUS | Status: DC
  Filled 2017-04-20: qty 250

## 2017-04-20 MED ORDER — CHLORHEXIDINE GLUCONATE 0.12 % MT SOLN
15.0000 mL | Freq: Once | OROMUCOSAL | Status: AC
  Administered 2017-04-21: 15 mL via OROMUCOSAL
  Filled 2017-04-20: qty 15

## 2017-04-20 MED ORDER — EPINEPHRINE PF 1 MG/ML IJ SOLN
0.0000 ug/min | INTRAVENOUS | Status: DC
  Filled 2017-04-20: qty 4

## 2017-04-20 MED ORDER — BISACODYL 5 MG PO TBEC
5.0000 mg | DELAYED_RELEASE_TABLET | Freq: Once | ORAL | Status: DC
  Filled 2017-04-20: qty 1

## 2017-04-20 MED ORDER — TRANEXAMIC ACID (OHS) BOLUS VIA INFUSION
15.0000 mg/kg | INTRAVENOUS | Status: AC
  Administered 2017-04-21: 1491 mg via INTRAVENOUS
  Filled 2017-04-20: qty 1491

## 2017-04-20 MED ORDER — TRANEXAMIC ACID (OHS) PUMP PRIME SOLUTION
2.0000 mg/kg | INTRAVENOUS | Status: DC
Start: 2017-04-21 — End: 2017-04-21
  Filled 2017-04-20: qty 1.99

## 2017-04-20 MED ORDER — DOPAMINE-DEXTROSE 3.2-5 MG/ML-% IV SOLN
0.0000 ug/kg/min | INTRAVENOUS | Status: DC
Start: 2017-04-21 — End: 2017-04-21
  Filled 2017-04-20: qty 250

## 2017-04-20 MED ORDER — SODIUM CHLORIDE 0.9 % IV SOLN
INTRAVENOUS | Status: DC
  Filled 2017-04-20: qty 30

## 2017-04-20 MED ORDER — MAGNESIUM SULFATE 50 % IJ SOLN
40.0000 meq | INTRAMUSCULAR | Status: DC
  Filled 2017-04-20: qty 10

## 2017-04-20 NOTE — Procedures (Signed)
Patient has declined CPAP for tonight.

## 2017-04-20 NOTE — Progress Notes (Signed)
[facility] PROGRESS NOTE  Sara [name] WUJ:[id number] DOB: 1956/03/16 DOA: 04/15/2017  PCP: [name], MD  Brief History/Interval Summary: 61 year old African-American female with a past medical history of essential thrombocytosis, history of Diabetes, gout, dyslipidemia, hypertension, sleep apnea, peripheral artery disease, presented with chest pain. She was found to have non-STEMI. Patient found to have severe bifurcation disease in the LAD and diagonal. Cardiothoracic surgery consultation was recommended for CABG.  Reason for Visit: Non-STEMI  Consultants: Cardiology. Cardiothoracic surgery.  Procedures:  Cardiac catheterization 6/25 Conclusion     1st Diag lesion, 75 %stenosed.  Prox LAD lesion, 90 %stenosed.  Ost 1st Diag to 1st Diag lesion, 80 %stenosed.  Dist LAD-2 lesion, 40 %stenosed.  Dist LAD-1 lesion, 75 %stenosed.  Ost 1st Mrg to 1st Mrg lesion, 25 %stenosed.  LV end diastolic pressure is normal.  There is no aortic valve stenosis.  No significant disease in the nondominant RCA or dominant circumflex.   Severe complex bifurcation disease in the proximal to mid LAD at the origin of the first diagonal.  Severe disease in the diagonal as well.  Disease further in the mid LAD as well.    Transthoracic echocardiogram Study Conclusions  - Left ventricle: The cavity size was normal. Wall thickness was   increased in a pattern of mild LVH. Hypokinesis of the   anteroseptal wall, the apex, and the apical inferior wall. The   estimated ejection fraction was 45%. Doppler parameters are   consistent with abnormal left ventricular relaxation (grade 1   diastolic dysfunction). - Aortic valve: There was no stenosis. - Mitral valve: There was no significant regurgitation. - Right ventricle: The cavity size was normal. Systolic function   was normal. - Tricuspid valve: Peak RV-RA gradient (S): 25 mm Hg. - Pulmonary arteries: PA peak pressure: 28  mm Hg (S). - Inferior vena cava: The vessel was normal in size. The   respirophasic diameter changes were in the normal range (>= 50%),   consistent with normal central venous pressure.  Impressions:  - Normal LV size with mild LV hypertrophy. Hypokinesis of the   anteroseptal wall, the apex, and the apical inferior wall. EF   45%. Normal RV size and systolic function. No significant   valvular abnormalities.   Antibiotics: None  Subjective/Interval History: Patient feels well this morning. Denies any chest pain, shortness of breath, nausea or vomiting.   ROS: Denies any headaches.  Objective:  Vital Signs  Vitals:   04/19/17 1035 04/19/17 1531 04/19/17 2209 04/20/17 0438  BP: (!) 153/76 126/74 (!) 161/54 (!) 146/80  Pulse: 90 82 74 83  Resp: (!) 22   13  Temp:  97.5 F (36.4 C) 97.8 F (36.6 C) 98.1 F (36.7 C)  TempSrc:  Oral Oral Oral  SpO2: 98% 99% 98% 96%  Weight:    99.4 kg (219 lb 3.2 oz)  Height:        Intake/Output Summary (Last 24 hours) at 04/20/17 0814 Last data filed at 04/20/17 0201  Gross per 24 hour  Intake           1045.5 ml  Output              600 ml  Net            445.5 ml   Filed Weights   04/18/17 0418 04/19/17 0632 04/20/17 0438  Weight: 99 kg (218 lb 3.2 oz) 99.5 kg (219 lb 4.8 oz) 99.4 kg (219 lb 3.2 oz)  General appearance: Awake, alert. No distress. Resp: Clear to auscultation bilaterally Cardio: S1, S2 is normal, regular. No S3, S4 or systolic murmur appreciated over the aortic area. No rubs, or bruit GI: Abdomen is soft, nontender, nondistended. Bowels are present. No masses, organomegaly Extremities: No edema Neurologic: No focal deficits  Lab Results:  Data Reviewed: I have personally reviewed following labs and imaging studies  CBC:  Recent Labs Lab 04/16/17 0212 04/17/17 0450 04/18/17 0459 04/19/17 0456 04/20/17 0500  WBC 8.1 9.8 7.8 7.5 7.3  HGB 14.2 13.6 12.8 12.5 13.2  HCT 43.2 41.0 39.5 38.9 40.9    MCV 88.2 87.8 88.2 88.8 88.1  PLT 383 326 316 313 361    Basic Metabolic Panel:  Recent Labs Lab 04/16/17 0212 04/16/17 0600 04/17/17 0450 04/18/17 0459 04/19/17 0456 04/20/17 0500  NA 141  --  138 140 141 141  K 3.3*  --  3.5 3.8 4.2 4.3  CL 105  --  105 108 109 108  CO2 27  --  23 22 23 25   GLUCOSE 107*  --  96 100* 93 99  BUN 11  --  15 15 16 16   CREATININE 1.22*  --  1.33* 1.22* 1.20* 1.24*  CALCIUM 9.2  --  9.0 8.9 9.0 9.4  MG  --  2.1 2.1  --   --   --   PHOS  --  3.7  --   --   --   --     GFR: Estimated Creatinine Clearance: 51.8 mL/min (A) (by C-G formula based on SCr of 1.24 mg/dL (H)).  Liver Function Tests:  Recent Labs Lab 04/16/17 0212  AST 70*  ALT 25  ALKPHOS 72  BILITOT 0.7  PROT 7.0  ALBUMIN 3.3*    Coagulation Profile:  Recent Labs Lab 04/15/17 1900 04/17/17 0450  INR 1.09 1.03    Cardiac Enzymes:  Recent Labs Lab 04/16/17 0212 04/16/17 0937 04/16/17 1604 04/17/17 0450  TROPONINI 3.83* 5.57* 21.64* 15.84*    CBG:  Recent Labs Lab 04/19/17 0752 04/19/17 1137 04/19/17 1648 04/19/17 2152 04/20/17 0732  GLUCAP 100* 93 91 110* 113*    Lipid Profile:  Recent Labs  04/17/17 1000  CHOL 102  HDL 38*  LDLCALC 56  TRIG 40  CHOLHDL 2.7     Recent Results (from the past 240 hour(s))  MRSA PCR Screening     Status: None   Collection Time: 04/16/17  1:43 AM  Result Value Ref Range Status   MRSA by PCR NEGATIVE NEGATIVE Final    Comment:        The GeneXpert MRSA Assay (FDA approved for NASAL specimens only), is one component of a comprehensive MRSA colonization surveillance program. It is not intended to diagnose MRSA infection nor to guide or monitor treatment for MRSA infections.   Surgical pcr screen     Status: None   Collection Time: 04/19/17  4:46 PM  Result Value Ref Range Status   MRSA, PCR NEGATIVE NEGATIVE Final   Staphylococcus aureus NEGATIVE NEGATIVE Final    Comment:        The Xpert SA  Assay (FDA approved for NASAL specimens in patients over 14 years of age), is one component of a comprehensive surveillance program.  Test performance has been validated by [facility] for patients greater than or equal to 39 year old. It is not intended to diagnose infection nor to guide or monitor treatment.       Radiology Studies:  No results found.   Medications:  Scheduled: . anagrelide  1 mg Oral Daily  . aspirin EC  81 mg Oral Daily  . atorvastatin  10 mg Oral q1800  . cholecalciferol  2,000 Units Oral Daily  . insulin aspart  0-9 Units Subcutaneous TID WC  . isosorbide mononitrate  15 mg Oral Daily  . lacosamide  100 mg Oral Daily   And  . lacosamide  150 mg Oral QHS  . metoprolol tartrate  25 mg Oral BID  . nitroGLYCERIN  1 inch Topical Q6H  . polyethylene glycol  17 g Oral Daily  . senna-docusate  1 tablet Oral BID  . sodium chloride flush  3 mL Intravenous Q12H   Continuous: . sodium chloride    . heparin 1,150 Units/hr (04/18/17 1942)   EYC:XKGYJE chloride, acetaminophen, ALPRAZolam, ondansetron (ZOFRAN) IV, simethicone, sodium chloride flush  Assessment/Plan:  Principal Problem:   ACS (acute coronary syndrome) (HCC) Active Problems:   Essential thrombocythemia (HCC)   Hypertension   Type 2 diabetes mellitus (HCC)   OSA (obstructive sleep apnea)   Hyperlipidemia   Chest pain   NSTEMI (non-ST elevated myocardial infarction) (HCC)   Elevated troponin    NSTEMI/Coronary artery disease/?Afib Patient underwent cardiac catheterization. Plan is for CABG. Cardiothoracic surgery has been consulted. Patient is currently on heparin, aspirin and nitrates. Patient is on beta blockers. Surgery is planned for this Friday. Patient takes [name] for history of essential thrombocytosis. This apparently can cause cardiac toxicity. Cardiology to discuss with hematology. It appears that patient had a brief run of atrial fibrillation yesterday evening. Cardiology is  aware.  History of essential thrombocythemia Continue aspirin. Patient is on [name].  History of essential hypertension.  Holding Avapro. Patient is on beta blocker. Monitor blood pressures closely.  History of type 2 diabetes. Continue CBGs. Continue sliding scale coverage. HbA1c 6.6  History of obstructive sleep apnea. Continue CPAP at bedtime.  History of hyperlipidemia. Patient is on atorvastatin.  History of seizures Followed by neurology. It appears that patient is on [name] at home. Continued.  DVT Prophylaxis: On IV heparin    Code Status: Full code  Family Communication: Discussed with the patient  Disposition Plan: Await CABG    LOS: 5 days   [facility] Pager [phone] 04/20/2017, 8:14 AM  If 7PM-7AM, please contact night-coverage at [email or web address], password [facility]

## 2017-04-20 NOTE — Progress Notes (Signed)
Pt remains stable with no chest pain for CABG tomorrow. I reviewed her tele and don't see any evidence of atrial fibrillation. She has been in sinus rhythm. All questions answered. Will FU post-op.  Sherren Mocha 04/20/2017 2:03 PM

## 2017-04-20 NOTE — Progress Notes (Signed)
Off unit to respiratory department for PFT. Left unit in wheelchair, no s/sx of anxiety or distress.

## 2017-04-20 NOTE — Progress Notes (Signed)
3 Days Post-Op Procedure(s) (LRB): Left Heart Cath and Coronary Angiography (N/A) Subjective: No chest pain or shortness of breath  Objective: Vital signs in last 24 hours: Temp:  [97.8 F (36.6 C)-98.1 F (36.7 C)] 98.1 F (36.7 C) (06/28 1450) Pulse Rate:  [74-83] 80 (06/28 1450) Cardiac Rhythm: Normal sinus rhythm;Heart block (06/28 0700) Resp:  [12-18] 18 (06/28 1700) BP: (112-161)/(52-80) 112/52 (06/28 1450) SpO2:  [96 %-98 %] 98 % (06/28 1450) Weight:  [99.4 kg (219 lb 3.2 oz)] 99.4 kg (219 lb 3.2 oz) (06/28 0438)  Hemodynamic parameters for last 24 hours:    Intake/Output from previous day: 06/27 0701 - 06/28 0700 In: 1045.5 [P.O.:850; I.V.:195.5] Out: 600 [Urine:600] Intake/Output this shift: No intake/output data recorded.    Lab Results:  Recent Labs  04/19/17 0456 04/20/17 0500  WBC 7.5 7.3  HGB 12.5 13.2  HCT 38.9 40.9  PLT 313 323   BMET:  Recent Labs  04/19/17 0456 04/20/17 0500  NA 141 141  K 4.2 4.3  CL 109 108  CO2 23 25  GLUCOSE 93 99  BUN 16 16  CREATININE 1.20* 1.24*  CALCIUM 9.0 9.4    PT/INR: No results for input(s): LABPROT, INR in the last 72 hours. ABG    Component Value Date/Time   TCO2 28 07/20/2013 1609   CBG (last 3)   Recent Labs  04/20/17 0732 04/20/17 1117 04/20/17 1656  GLUCAP 113* 106* 91    Assessment/Plan:  High grade LAD/D stenosis. Plan CABG in am. Pt has no further questions.  LOS: 5 days    [name] 04/20/2017

## 2017-04-20 NOTE — Progress Notes (Signed)
ANTICOAGULATION CONSULT NOTE - Follow Up Consult  Pharmacy Consult for Heparin  Indication: chest pain/ACS  Allergies  Allergen Reactions  . Sulfa Antibiotics Itching    Patient Measurements: Height: 5\' 2"  (157.5 cm) Weight: 219 lb 3.2 oz (99.4 kg) IBW/kg (Calculated) : 50.1  Vital Signs: Temp: 98.1 F (36.7 C) (06/28 0438) Temp Source: Oral (06/28 0438) BP: 146/80 (06/28 0438) Pulse Rate: 83 (06/28 0438)  Labs:  Recent Labs  04/18/17 0459 04/19/17 0456 04/20/17 0500  HGB 12.8 12.5 13.2  HCT 39.5 38.9 40.9  PLT 316 313 323  HEPARINUNFRC 0.36 0.52 0.66  CREATININE 1.22* 1.20* 1.24*    Estimated Creatinine Clearance: 51.8 mL/min (A) (by C-G formula based on SCr of 1.24 mg/dL (H)).   Assessment: Heparin for CP/elevated troponin. Not an anticoagulation PTA. Noted to be on anagrelide for thrombocythemia. Will need to this closely when used with anticoagulation/antiplatelets for increased risk  Heparin level therapeutic this AM, CBC stable  CABG planned for 6/29  Goal of Therapy:  Heparin level 0.3-0.7 units/ml Monitor platelets by anticoagulation protocol: Yes   Plan:  Continue heparin at 1150 units / hr Daily labs  Thank you Anette Guarneri, PharmD [phone]  04/20/2017

## 2017-04-21 ENCOUNTER — Encounter (HOSPITAL_COMMUNITY)
Admission: EM | Disposition: A | Discharge status: Home / Self Care | Payer: Self-pay | Source: Home / Self Care | Attending: Surgery

## 2017-04-21 ENCOUNTER — Inpatient Hospital Stay (HOSPITAL_COMMUNITY): Payer: BC Managed Care – PPO | Admitting: Anesthesiology

## 2017-04-21 ENCOUNTER — Inpatient Hospital Stay (HOSPITAL_COMMUNITY): Payer: BC Managed Care – PPO

## 2017-04-21 ENCOUNTER — Inpatient Hospital Stay (HOSPITAL_COMMUNITY)
Admit: 2017-04-21 | Discharge: 2017-04-21 | Disposition: A | Discharge status: Home / Self Care | Payer: BC Managed Care – PPO | Attending: Surgery | Admitting: Surgery

## 2017-04-21 DIAGNOSIS — I25110 Atherosclerotic heart disease of native coronary artery with unstable angina pectoris: Secondary | ICD-10-CM

## 2017-04-21 DIAGNOSIS — Z951 Presence of aortocoronary bypass graft: Secondary | ICD-10-CM

## 2017-04-21 HISTORY — PX: TEE WITHOUT CARDIOVERSION: SHX5443

## 2017-04-21 HISTORY — PX: CORONARY ARTERY BYPASS GRAFT: SHX141

## 2017-04-21 LAB — POCT I-STAT 4, (NA,K, GLUC, HGB,HCT)
Glucose, Bld: 122 mg/dL — ABNORMAL HIGH (ref 65–99)
HCT: 29 % — ABNORMAL LOW (ref 36.0–46.0)
Hemoglobin: 9.9 g/dL — ABNORMAL LOW (ref 12.0–15.0)
Potassium: 4.1 mmol/L (ref 3.5–5.1)
SODIUM: 142 mmol/L (ref 135–145)

## 2017-04-21 LAB — HEMOGLOBIN AND HEMATOCRIT, BLOOD
HEMATOCRIT: 26.3 % — AB (ref 36.0–46.0)
HEMOGLOBIN: 8.6 g/dL — AB (ref 12.0–15.0)

## 2017-04-21 LAB — POCT I-STAT 3, ART BLOOD GAS (G3+)
Acid-Base Excess: 3 mmol/L — ABNORMAL HIGH (ref 0.0–2.0)
Acid-base deficit: 3 mmol/L — ABNORMAL HIGH (ref 0.0–2.0)
Acid-base deficit: 3 mmol/L — ABNORMAL HIGH (ref 0.0–2.0)
Acid-base deficit: 3 mmol/L — ABNORMAL HIGH (ref 0.0–2.0)
BICARBONATE: 23.4 mmol/L (ref 20.0–28.0)
Bicarbonate: 27.3 mmol/L (ref 20.0–28.0)
Bicarbonate: 22.8 mmol/L (ref 20.0–28.0)
Bicarbonate: 23.9 mmol/L (ref 20.0–28.0)
O2 Saturation: 100 %
O2 Saturation: 97 %
O2 Saturation: 97 %
O2 Saturation: 97 %
PCO2 ART: 37.8 mmHg (ref 32.0–48.0)
PCO2 ART: 38.7 mmHg (ref 32.0–48.0)
PCO2 ART: 47.3 mmHg (ref 32.0–48.0)
PCO2 ART: 45.3 mmHg (ref 32.0–48.0)
PH ART: 7.467 — AB (ref 7.350–7.450)
PO2 ART: 86 mmHg (ref 83.0–108.0)
PO2 ART: 93 mmHg (ref 83.0–108.0)
PO2 ART: 97 mmHg (ref 83.0–108.0)
Patient temperature: 35.5
Patient temperature: 36.6
TCO2: 28 mmol/L (ref 0–100)
TCO2: 24 mmol/L (ref 0–100)
TCO2: 25 mmol/L (ref 0–100)
TCO2: 25 mmol/L (ref 0–100)
pH, Arterial: 7.371 (ref 7.350–7.450)
pH, Arterial: 7.31 — ABNORMAL LOW (ref 7.350–7.450)
pH, Arterial: 7.319 — ABNORMAL LOW (ref 7.350–7.450)
pO2, Arterial: 299 mmHg — ABNORMAL HIGH (ref 83.0–108.0)

## 2017-04-21 LAB — POCT I-STAT, CHEM 8
BUN: 18 mg/dL (ref 6–20)
BUN: 18 mg/dL (ref 6–20)
BUN: 16 mg/dL (ref 6–20)
BUN: 16 mg/dL (ref 6–20)
BUN: 15 mg/dL (ref 6–20)
BUN: 15 mg/dL (ref 6–20)
CALCIUM ION: 1.31 mmol/L (ref 1.15–1.40)
CALCIUM ION: 1.28 mmol/L (ref 1.15–1.40)
CHLORIDE: 101 mmol/L (ref 101–111)
CHLORIDE: 102 mmol/L (ref 101–111)
CHLORIDE: 101 mmol/L (ref 101–111)
CHLORIDE: 105 mmol/L (ref 101–111)
CREATININE: 0.9 mg/dL (ref 0.44–1.00)
CREATININE: 1 mg/dL (ref 0.44–1.00)
CREATININE: 1.1 mg/dL — AB (ref 0.44–1.00)
Calcium, Ion: 1.09 mmol/L — ABNORMAL LOW (ref 1.15–1.40)
Calcium, Ion: 1.16 mmol/L (ref 1.15–1.40)
Calcium, Ion: 1.15 mmol/L (ref 1.15–1.40)
Calcium, Ion: 1.19 mmol/L (ref 1.15–1.40)
Chloride: 106 mmol/L (ref 101–111)
Chloride: 105 mmol/L (ref 101–111)
Creatinine, Ser: 0.8 mg/dL (ref 0.44–1.00)
Creatinine, Ser: 0.9 mg/dL (ref 0.44–1.00)
Creatinine, Ser: 1 mg/dL (ref 0.44–1.00)
GLUCOSE: 103 mg/dL — AB (ref 65–99)
GLUCOSE: 103 mg/dL — AB (ref 65–99)
GLUCOSE: 98 mg/dL (ref 65–99)
GLUCOSE: 113 mg/dL — AB (ref 65–99)
Glucose, Bld: 101 mg/dL — ABNORMAL HIGH (ref 65–99)
Glucose, Bld: 127 mg/dL — ABNORMAL HIGH (ref 65–99)
HCT: 32 % — ABNORMAL LOW (ref 36.0–46.0)
HCT: 25 % — ABNORMAL LOW (ref 36.0–46.0)
HCT: 26 % — ABNORMAL LOW (ref 36.0–46.0)
HEMATOCRIT: 35 % — AB (ref 36.0–46.0)
HEMATOCRIT: 26 % — AB (ref 36.0–46.0)
HEMATOCRIT: 33 % — AB (ref 36.0–46.0)
HEMOGLOBIN: 11.9 g/dL — AB (ref 12.0–15.0)
HEMOGLOBIN: 10.9 g/dL — AB (ref 12.0–15.0)
HEMOGLOBIN: 8.5 g/dL — AB (ref 12.0–15.0)
HEMOGLOBIN: 11.2 g/dL — AB (ref 12.0–15.0)
Hemoglobin: 8.8 g/dL — ABNORMAL LOW (ref 12.0–15.0)
Hemoglobin: 8.8 g/dL — ABNORMAL LOW (ref 12.0–15.0)
POTASSIUM: 3.9 mmol/L (ref 3.5–5.1)
POTASSIUM: 4.9 mmol/L (ref 3.5–5.1)
POTASSIUM: 4.6 mmol/L (ref 3.5–5.1)
Potassium: 4 mmol/L (ref 3.5–5.1)
Potassium: 5.1 mmol/L (ref 3.5–5.1)
Potassium: 4.5 mmol/L (ref 3.5–5.1)
SODIUM: 136 mmol/L (ref 135–145)
SODIUM: 138 mmol/L (ref 135–145)
SODIUM: 138 mmol/L (ref 135–145)
Sodium: 141 mmol/L (ref 135–145)
Sodium: 142 mmol/L (ref 135–145)
Sodium: 141 mmol/L (ref 135–145)
TCO2: 28 mmol/L (ref 0–100)
TCO2: 26 mmol/L (ref 0–100)
TCO2: 25 mmol/L (ref 0–100)
TCO2: 26 mmol/L (ref 0–100)
TCO2: 23 mmol/L (ref 0–100)
TCO2: 24 mmol/L (ref 0–100)

## 2017-04-21 LAB — CBC
HCT: 43.2 % (ref 36.0–46.0)
HCT: 29.4 % — ABNORMAL LOW (ref 36.0–46.0)
HEMATOCRIT: 33.2 % — AB (ref 36.0–46.0)
HEMOGLOBIN: 9.6 g/dL — AB (ref 12.0–15.0)
Hemoglobin: 13.6 g/dL (ref 12.0–15.0)
Hemoglobin: 10.7 g/dL — ABNORMAL LOW (ref 12.0–15.0)
MCH: 28.3 pg (ref 26.0–34.0)
MCH: 28.5 pg (ref 26.0–34.0)
MCH: 28.5 pg (ref 26.0–34.0)
MCHC: 31.5 g/dL (ref 30.0–36.0)
MCHC: 32.7 g/dL (ref 30.0–36.0)
MCHC: 32.2 g/dL (ref 30.0–36.0)
MCV: 89.8 fL (ref 78.0–100.0)
MCV: 87.2 fL (ref 78.0–100.0)
MCV: 88.3 fL (ref 78.0–100.0)
Platelets: 367 10*3/uL (ref 150–400)
Platelets: 180 10*3/uL (ref 150–400)
Platelets: 241 10*3/uL (ref 150–400)
RBC: 4.81 MIL/uL (ref 3.87–5.11)
RBC: 3.37 MIL/uL — AB (ref 3.87–5.11)
RBC: 3.76 MIL/uL — AB (ref 3.87–5.11)
RDW: 13.8 % (ref 11.5–15.5)
RDW: 13.3 % (ref 11.5–15.5)
RDW: 13.7 % (ref 11.5–15.5)
WBC: 6.9 10*3/uL (ref 4.0–10.5)
WBC: 8 10*3/uL (ref 4.0–10.5)
WBC: 9.3 10*3/uL (ref 4.0–10.5)

## 2017-04-21 LAB — GLUCOSE, CAPILLARY
GLUCOSE-CAPILLARY: 101 mg/dL — AB (ref 65–99)
GLUCOSE-CAPILLARY: 110 mg/dL — AB (ref 65–99)
GLUCOSE-CAPILLARY: 114 mg/dL — AB (ref 65–99)
GLUCOSE-CAPILLARY: 159 mg/dL — AB (ref 65–99)
GLUCOSE-CAPILLARY: 128 mg/dL — AB (ref 65–99)
Glucose-Capillary: 122 mg/dL — ABNORMAL HIGH (ref 65–99)
Glucose-Capillary: 108 mg/dL — ABNORMAL HIGH (ref 65–99)
Glucose-Capillary: 105 mg/dL — ABNORMAL HIGH (ref 65–99)
Glucose-Capillary: 96 mg/dL (ref 65–99)
Glucose-Capillary: 112 mg/dL — ABNORMAL HIGH (ref 65–99)
Glucose-Capillary: 105 mg/dL — ABNORMAL HIGH (ref 65–99)

## 2017-04-21 LAB — PROTIME-INR
INR: 1.29
Prothrombin Time: 16.1 seconds — ABNORMAL HIGH (ref 11.4–15.2)

## 2017-04-21 LAB — BASIC METABOLIC PANEL
ANION GAP: 9 (ref 5–15)
BUN: 16 mg/dL (ref 6–20)
CHLORIDE: 105 mmol/L (ref 101–111)
CO2: 26 mmol/L (ref 22–32)
Calcium: 9.5 mg/dL (ref 8.9–10.3)
Creatinine, Ser: 1.21 mg/dL — ABNORMAL HIGH (ref 0.44–1.00)
GFR calc non Af Amer: 47 mL/min — ABNORMAL LOW (ref >60)
GFR, EST AFRICAN AMERICAN: 54 mL/min — AB (ref >60)
Glucose, Bld: 104 mg/dL — ABNORMAL HIGH (ref 65–99)
POTASSIUM: 3.9 mmol/L (ref 3.5–5.1)
SODIUM: 140 mmol/L (ref 135–145)

## 2017-04-21 LAB — CREATININE, SERUM
Creatinine, Ser: 1.19 mg/dL — ABNORMAL HIGH (ref 0.44–1.00)
GFR, EST AFRICAN AMERICAN: 56 mL/min — AB (ref >60)
GFR, EST NON AFRICAN AMERICAN: 48 mL/min — AB (ref >60)

## 2017-04-21 LAB — HEPARIN LEVEL (UNFRACTIONATED): Heparin Unfractionated: 0.72 IU/mL — ABNORMAL HIGH (ref 0.30–0.70)

## 2017-04-21 LAB — MAGNESIUM: Magnesium: 3.3 mg/dL — ABNORMAL HIGH (ref 1.7–2.4)

## 2017-04-21 LAB — PLATELET COUNT: PLATELETS: 223 10*3/uL (ref 150–400)

## 2017-04-21 LAB — APTT: aPTT: 37 seconds — ABNORMAL HIGH (ref 24–36)

## 2017-04-21 SURGERY — CORONARY ARTERY BYPASS GRAFTING (CABG)
Anesthesia: General | Site: Chest

## 2017-04-21 MED ORDER — SODIUM CHLORIDE 0.9 % IV SOLN
250.0000 mL | INTRAVENOUS | Status: DC
  Administered 2017-04-22: 250 mL via INTRAVENOUS

## 2017-04-21 MED ORDER — PROTAMINE SULFATE 10 MG/ML IV SOLN
INTRAVENOUS | Status: AC
  Filled 2017-04-21: qty 30

## 2017-04-21 MED ORDER — LACTATED RINGERS IV SOLN
INTRAVENOUS | Status: DC | PRN
  Administered 2017-04-21 (×2): via INTRAVENOUS

## 2017-04-21 MED ORDER — ACETAMINOPHEN 500 MG PO TABS
1000.0000 mg | ORAL_TABLET | Freq: Four times a day (QID) | ORAL | Status: DC
  Administered 2017-04-22 – 2017-04-24 (×3): 1000 mg via ORAL
  Filled 2017-04-21 (×3): qty 2

## 2017-04-21 MED ORDER — FENTANYL CITRATE (PF) 250 MCG/5ML IJ SOLN
INTRAMUSCULAR | Status: AC
  Filled 2017-04-21: qty 5

## 2017-04-21 MED ORDER — NITROGLYCERIN IN D5W 200-5 MCG/ML-% IV SOLN: 0.0000 ug/min | INTRAVENOUS | Status: DC

## 2017-04-21 MED ORDER — CHLORHEXIDINE GLUCONATE 0.12% ORAL RINSE (MEDLINE KIT): 15.0000 mL | Freq: Two times a day (BID) | OROMUCOSAL | Status: DC

## 2017-04-21 MED ORDER — 0.9 % SODIUM CHLORIDE (POUR BTL) OPTIME
TOPICAL | Status: DC | PRN
Start: 2017-04-21 — End: 2017-04-21
  Administered 2017-04-21: 5000 mL

## 2017-04-21 MED ORDER — THROMBIN 20000 UNITS EX SOLR
CUTANEOUS | Status: DC | PRN
  Administered 2017-04-21: 20000 [IU] via TOPICAL

## 2017-04-21 MED ORDER — MORPHINE SULFATE (PF) 4 MG/ML IV SOLN: 1.0000 mg | INTRAVENOUS | Status: AC | PRN

## 2017-04-21 MED ORDER — ACETAMINOPHEN 160 MG/5ML PO SOLN: 650.0000 mg | Freq: Once | ORAL | Status: AC

## 2017-04-21 MED ORDER — SODIUM CHLORIDE 0.9 % IV SOLN
INTRAVENOUS | Status: DC
  Filled 2017-04-21: qty 1

## 2017-04-21 MED ORDER — SODIUM CHLORIDE 0.9 % IV SOLN
0.0000 ug/min | INTRAVENOUS | Status: DC
  Filled 2017-04-21: qty 2

## 2017-04-21 MED ORDER — HEPARIN SODIUM (PORCINE) 1000 UNIT/ML IJ SOLN
INTRAMUSCULAR | Status: DC | PRN
  Administered 2017-04-21: 5000 [IU] via INTRAVENOUS
  Administered 2017-04-21: 34000 [IU] via INTRAVENOUS

## 2017-04-21 MED ORDER — VANCOMYCIN HCL IN DEXTROSE 1-5 GM/200ML-% IV SOLN
1000.0000 mg | Freq: Once | INTRAVENOUS | Status: AC
  Administered 2017-04-21: 1000 mg via INTRAVENOUS
  Filled 2017-04-21: qty 200

## 2017-04-21 MED ORDER — SODIUM CHLORIDE 0.9 % IJ SOLN
INTRAMUSCULAR | Status: AC
  Filled 2017-04-21: qty 10

## 2017-04-21 MED ORDER — CHLORHEXIDINE GLUCONATE CLOTH 2 % EX PADS
6.0000 | MEDICATED_PAD | Freq: Every day | CUTANEOUS | Status: DC
  Administered 2017-04-21 – 2017-04-23 (×3): 6 via TOPICAL

## 2017-04-21 MED ORDER — SODIUM CHLORIDE 0.9% FLUSH
3.0000 mL | Freq: Two times a day (BID) | INTRAVENOUS | Status: DC
  Administered 2017-04-22: 10 mL via INTRAVENOUS
  Administered 2017-04-23 (×2): 3 mL via INTRAVENOUS
  Administered 2017-04-23: 10 mL via INTRAVENOUS

## 2017-04-21 MED ORDER — METOPROLOL TARTRATE 25 MG/10 ML ORAL SUSPENSION: 12.5000 mg | Freq: Two times a day (BID) | ORAL | Status: DC

## 2017-04-21 MED ORDER — ASPIRIN EC 325 MG PO TBEC
325.0000 mg | DELAYED_RELEASE_TABLET | Freq: Every day | ORAL | Status: DC
  Administered 2017-04-22: 325 mg via ORAL
  Filled 2017-04-21: qty 1

## 2017-04-21 MED ORDER — THROMBIN 20000 UNITS EX SOLR: CUTANEOUS | Status: DC | PRN

## 2017-04-21 MED ORDER — SODIUM CHLORIDE 0.9% FLUSH: 10.0000 mL | INTRAVENOUS | Status: DC | PRN

## 2017-04-21 MED ORDER — LACTATED RINGERS IV SOLN: INTRAVENOUS | Status: DC

## 2017-04-21 MED ORDER — ORAL CARE MOUTH RINSE
15.0000 mL | OROMUCOSAL | Status: DC
  Administered 2017-04-21: 15 mL via OROMUCOSAL

## 2017-04-21 MED ORDER — TRAMADOL HCL 50 MG PO TABS
50.0000 mg | ORAL_TABLET | ORAL | Status: DC | PRN
  Administered 2017-04-21: 100 mg via ORAL
  Administered 2017-04-23: 50 mg via ORAL
  Filled 2017-04-21 (×2): qty 2
  Filled 2017-04-21: qty 1

## 2017-04-21 MED ORDER — ACETAMINOPHEN 160 MG/5ML PO SOLN
1000.0000 mg | Freq: Four times a day (QID) | ORAL | Status: DC
  Administered 2017-04-22 – 2017-04-23 (×4): 1000 mg
  Filled 2017-04-21 (×6): qty 40.6

## 2017-04-21 MED ORDER — MIDAZOLAM HCL 10 MG/2ML IJ SOLN
INTRAMUSCULAR | Status: AC
  Filled 2017-04-21: qty 2

## 2017-04-21 MED ORDER — BISACODYL 5 MG PO TBEC
10.0000 mg | DELAYED_RELEASE_TABLET | Freq: Every day | ORAL | Status: DC
  Administered 2017-04-22 – 2017-04-23 (×2): 10 mg via ORAL
  Filled 2017-04-21 (×2): qty 2

## 2017-04-21 MED ORDER — ONDANSETRON HCL 4 MG/2ML IJ SOLN
4.0000 mg | Freq: Four times a day (QID) | INTRAMUSCULAR | Status: DC | PRN
  Administered 2017-04-22: 4 mg via INTRAVENOUS
  Filled 2017-04-21: qty 2

## 2017-04-21 MED ORDER — THROMBIN 20000 UNITS EX SOLR
OROMUCOSAL | Status: DC | PRN
  Administered 2017-04-21 (×3): via TOPICAL

## 2017-04-21 MED ORDER — DOCUSATE SODIUM 100 MG PO CAPS
200.0000 mg | ORAL_CAPSULE | Freq: Every day | ORAL | Status: DC
  Administered 2017-04-22 – 2017-04-23 (×2): 200 mg via ORAL
  Filled 2017-04-21 (×2): qty 2

## 2017-04-21 MED ORDER — CHLORHEXIDINE GLUCONATE 0.12 % MT SOLN
15.0000 mL | Freq: Two times a day (BID) | OROMUCOSAL | Status: DC
  Administered 2017-04-21: 15 mL via OROMUCOSAL

## 2017-04-21 MED ORDER — POTASSIUM CHLORIDE 10 MEQ/50ML IV SOLN: 10.0000 meq | INTRAVENOUS | Status: AC

## 2017-04-21 MED ORDER — MORPHINE SULFATE (PF) 2 MG/ML IV SOLN: 1.0000 mg | INTRAVENOUS | Status: DC | PRN

## 2017-04-21 MED ORDER — SODIUM CHLORIDE 0.9% FLUSH
10.0000 mL | Freq: Two times a day (BID) | INTRAVENOUS | Status: DC
  Administered 2017-04-21: 20 mL
  Administered 2017-04-22: 10 mL

## 2017-04-21 MED ORDER — BISACODYL 10 MG RE SUPP: 10.0000 mg | Freq: Every day | RECTAL | Status: DC

## 2017-04-21 MED ORDER — MIDAZOLAM HCL 5 MG/5ML IJ SOLN
INTRAMUSCULAR | Status: DC | PRN
  Administered 2017-04-21: 3 mg via INTRAVENOUS
  Administered 2017-04-21: 2 mg via INTRAVENOUS
  Administered 2017-04-21: 5 mg via INTRAVENOUS

## 2017-04-21 MED ORDER — FAMOTIDINE IN NACL 20-0.9 MG/50ML-% IV SOLN
20.0000 mg | Freq: Two times a day (BID) | INTRAVENOUS | Status: DC
  Administered 2017-04-21: 20 mg via INTRAVENOUS

## 2017-04-21 MED ORDER — PROTAMINE SULFATE 10 MG/ML IV SOLN
INTRAVENOUS | Status: AC
  Filled 2017-04-21: qty 5

## 2017-04-21 MED ORDER — PROPOFOL 10 MG/ML IV BOLUS
INTRAVENOUS | Status: AC
  Filled 2017-04-21: qty 20

## 2017-04-21 MED ORDER — MORPHINE SULFATE (PF) 4 MG/ML IV SOLN
2.0000 mg | INTRAVENOUS | Status: DC | PRN
  Administered 2017-04-21 – 2017-04-22 (×2): 2 mg via INTRAVENOUS
  Filled 2017-04-21 (×2): qty 1

## 2017-04-21 MED ORDER — INSULIN REGULAR BOLUS VIA INFUSION
0.0000 [IU] | Freq: Three times a day (TID) | INTRAVENOUS | Status: DC
  Filled 2017-04-21: qty 10

## 2017-04-21 MED ORDER — METOPROLOL TARTRATE 12.5 MG HALF TABLET
12.5000 mg | ORAL_TABLET | Freq: Two times a day (BID) | ORAL | Status: DC
  Administered 2017-04-21 – 2017-04-23 (×5): 12.5 mg via ORAL
  Filled 2017-04-21 (×5): qty 1

## 2017-04-21 MED ORDER — SODIUM CHLORIDE 0.9% FLUSH: 3.0000 mL | INTRAVENOUS | Status: DC | PRN

## 2017-04-21 MED ORDER — OXYCODONE HCL 5 MG PO TABS
5.0000 mg | ORAL_TABLET | ORAL | Status: DC | PRN
  Administered 2017-04-22: 5 mg via ORAL
  Administered 2017-04-22: 10 mg via ORAL
  Administered 2017-04-22 – 2017-04-23 (×3): 5 mg via ORAL
  Filled 2017-04-21 (×4): qty 1
  Filled 2017-04-21: qty 2

## 2017-04-21 MED ORDER — THROMBIN 20000 UNITS EX SOLR
CUTANEOUS | Status: AC
  Filled 2017-04-21: qty 20000

## 2017-04-21 MED ORDER — LACTATED RINGERS IV SOLN
INTRAVENOUS | Status: DC | PRN
  Administered 2017-04-21 (×2): via INTRAVENOUS

## 2017-04-21 MED ORDER — LACTATED RINGERS IV SOLN: 500.0000 mL | Freq: Once | INTRAVENOUS | Status: DC | PRN

## 2017-04-21 MED ORDER — CHLORHEXIDINE GLUCONATE 0.12 % MT SOLN
15.0000 mL | OROMUCOSAL | Status: AC
  Administered 2017-04-21: 15 mL via OROMUCOSAL

## 2017-04-21 MED ORDER — HEPARIN SODIUM (PORCINE) 1000 UNIT/ML IJ SOLN
INTRAMUSCULAR | Status: AC
  Filled 2017-04-21: qty 1

## 2017-04-21 MED ORDER — ACETAMINOPHEN 650 MG RE SUPP
650.0000 mg | Freq: Once | RECTAL | Status: AC
  Administered 2017-04-21: 650 mg via RECTAL

## 2017-04-21 MED ORDER — ALPRAZOLAM 0.5 MG PO TABS
0.5000 mg | ORAL_TABLET | Freq: Every evening | ORAL | Status: DC | PRN
  Administered 2017-04-22: 0.5 mg via ORAL
  Filled 2017-04-21: qty 1

## 2017-04-21 MED ORDER — PANTOPRAZOLE SODIUM 40 MG PO TBEC
40.0000 mg | DELAYED_RELEASE_TABLET | Freq: Every day | ORAL | Status: DC
  Administered 2017-04-23: 40 mg via ORAL
  Filled 2017-04-21: qty 1

## 2017-04-21 MED ORDER — METOPROLOL TARTRATE 5 MG/5ML IV SOLN: 2.5000 mg | INTRAVENOUS | Status: DC | PRN

## 2017-04-21 MED ORDER — ROCURONIUM BROMIDE 10 MG/ML (PF) SYRINGE
PREFILLED_SYRINGE | INTRAVENOUS | Status: AC
  Filled 2017-04-21: qty 10

## 2017-04-21 MED ORDER — LIDOCAINE 2% (20 MG/ML) 5 ML SYRINGE
INTRAMUSCULAR | Status: DC | PRN
  Administered 2017-04-21: 100 mg via INTRAVENOUS

## 2017-04-21 MED ORDER — ROCURONIUM BROMIDE 100 MG/10ML IV SOLN
INTRAVENOUS | Status: DC | PRN
  Administered 2017-04-21: 50 mg via INTRAVENOUS
  Administered 2017-04-21: 60 mg via INTRAVENOUS
  Administered 2017-04-21: 40 mg via INTRAVENOUS

## 2017-04-21 MED ORDER — ASPIRIN 81 MG PO CHEW
324.0000 mg | CHEWABLE_TABLET | Freq: Every day | ORAL | Status: DC
  Administered 2017-04-23: 324 mg
  Filled 2017-04-21: qty 4

## 2017-04-21 MED ORDER — MIDAZOLAM HCL 2 MG/2ML IJ SOLN: 2.0000 mg | INTRAMUSCULAR | Status: DC | PRN

## 2017-04-21 MED ORDER — DEXTROSE 5 % IV SOLN
1.5000 g | Freq: Two times a day (BID) | INTRAVENOUS | Status: AC
  Administered 2017-04-21 – 2017-04-23 (×4): 1.5 g via INTRAVENOUS
  Filled 2017-04-21 (×4): qty 1.5

## 2017-04-21 MED ORDER — ORAL CARE MOUTH RINSE: 15.0000 mL | Freq: Two times a day (BID) | OROMUCOSAL | Status: DC

## 2017-04-21 MED ORDER — MAGNESIUM SULFATE 4 GM/100ML IV SOLN
4.0000 g | Freq: Once | INTRAVENOUS | Status: AC
  Administered 2017-04-21: 4 g via INTRAVENOUS
  Filled 2017-04-21: qty 100

## 2017-04-21 MED ORDER — SODIUM CHLORIDE 0.9 % IV SOLN
0.0000 ug/kg/h | INTRAVENOUS | Status: DC
  Administered 2017-04-21: 0.5 ug/kg/h via INTRAVENOUS
  Filled 2017-04-21 (×3): qty 2

## 2017-04-21 MED ORDER — FENTANYL CITRATE (PF) 250 MCG/5ML IJ SOLN
INTRAMUSCULAR | Status: AC
  Filled 2017-04-21: qty 30

## 2017-04-21 MED ORDER — LACOSAMIDE 50 MG PO TABS
100.0000 mg | ORAL_TABLET | Freq: Every day | ORAL | Status: DC
  Administered 2017-04-22: 100 mg via ORAL
  Administered 2017-04-23: 50 mg via ORAL
  Filled 2017-04-21 (×2): qty 2

## 2017-04-21 MED ORDER — ATORVASTATIN CALCIUM 10 MG PO TABS
10.0000 mg | ORAL_TABLET | Freq: Every day | ORAL | Status: DC
  Administered 2017-04-22 – 2017-04-25 (×4): 10 mg via ORAL
  Filled 2017-04-21 (×4): qty 1

## 2017-04-21 MED ORDER — LACOSAMIDE 50 MG PO TABS
150.0000 mg | ORAL_TABLET | Freq: Every day | ORAL | Status: DC
  Administered 2017-04-22: 150 mg via ORAL
  Filled 2017-04-21: qty 3

## 2017-04-21 MED ORDER — ALBUMIN HUMAN 5 % IV SOLN
250.0000 mL | INTRAVENOUS | Status: AC | PRN
  Administered 2017-04-21 (×2): 250 mL via INTRAVENOUS

## 2017-04-21 MED ORDER — SODIUM CHLORIDE 0.9 % IV SOLN
INTRAVENOUS | Status: DC
  Administered 2017-04-21: 12:00:00 via INTRAVENOUS

## 2017-04-21 MED ORDER — SODIUM CHLORIDE 0.45 % IV SOLN: INTRAVENOUS | Status: DC | PRN

## 2017-04-21 MED ORDER — LACTATED RINGERS IV SOLN
INTRAVENOUS | Status: DC | PRN
  Administered 2017-04-21: 06:00:00 via INTRAVENOUS

## 2017-04-21 MED ORDER — PROPOFOL 10 MG/ML IV BOLUS
INTRAVENOUS | Status: DC | PRN
  Administered 2017-04-21: 100 mg via INTRAVENOUS

## 2017-04-21 MED ORDER — HEMOSTATIC AGENTS (NO CHARGE) OPTIME
TOPICAL | Status: DC | PRN
  Administered 2017-04-21: 1 via TOPICAL

## 2017-04-21 MED ORDER — FENTANYL CITRATE (PF) 250 MCG/5ML IJ SOLN
INTRAMUSCULAR | Status: DC | PRN
  Administered 2017-04-21: 50 ug via INTRAVENOUS
  Administered 2017-04-21: 100 ug via INTRAVENOUS
  Administered 2017-04-21 (×2): 250 ug via INTRAVENOUS
  Administered 2017-04-21: 100 ug via INTRAVENOUS
  Administered 2017-04-21: 150 ug via INTRAVENOUS
  Administered 2017-04-21: 250 ug via INTRAVENOUS
  Administered 2017-04-21: 100 ug via INTRAVENOUS
  Administered 2017-04-21 (×3): 250 ug via INTRAVENOUS

## 2017-04-21 MED ORDER — MORPHINE SULFATE (PF) 2 MG/ML IV SOLN: 2.0000 mg | INTRAVENOUS | Status: DC | PRN

## 2017-04-21 MED ORDER — ALBUMIN HUMAN 5 % IV SOLN
INTRAVENOUS | Status: DC | PRN
  Administered 2017-04-21: 11:00:00 via INTRAVENOUS

## 2017-04-21 MED ORDER — DIPHENHYDRAMINE HCL 50 MG/ML IJ SOLN
INTRAMUSCULAR | Status: DC | PRN
  Administered 2017-04-21: 50 mg via INTRAVENOUS

## 2017-04-21 MED FILL — Mannitol IV Soln 20%: INTRAVENOUS | Qty: 500 | Status: AC

## 2017-04-21 MED FILL — Lidocaine HCl IV Inj 20 MG/ML: INTRAVENOUS | Qty: 5 | Status: AC

## 2017-04-21 MED FILL — Potassium Chloride Inj 2 mEq/ML: INTRAVENOUS | Qty: 10 | Status: AC

## 2017-04-21 MED FILL — Magnesium Sulfate Inj 50%: INTRAMUSCULAR | Qty: 2 | Status: AC

## 2017-04-21 MED FILL — Sodium Bicarbonate IV Soln 8.4%: INTRAVENOUS | Qty: 50 | Status: AC

## 2017-04-21 MED FILL — Electrolyte-R (PH 7.4) Solution: INTRAVENOUS | Qty: 3000 | Status: AC

## 2017-04-21 MED FILL — Heparin Sodium (Porcine) Inj 1000 Unit/ML: INTRAMUSCULAR | Qty: 10 | Status: AC

## 2017-04-21 MED FILL — Heparin Sodium (Porcine) Inj 1000 Unit/ML: INTRAMUSCULAR | Qty: 30 | Status: AC

## 2017-04-21 MED FILL — Sodium Chloride IV Soln 0.9%: INTRAVENOUS | Qty: 2000 | Status: AC

## 2017-04-21 SURGICAL SUPPLY — 85 items
BAG DECANTER FOR FLEXI CONT (MISCELLANEOUS) ×3 IMPLANT
BANDAGE ACE 4X5 VEL STRL LF (GAUZE/BANDAGES/DRESSINGS) ×3 IMPLANT
BANDAGE ACE 6X5 VEL STRL LF (GAUZE/BANDAGES/DRESSINGS) ×3 IMPLANT
BASKET HEART (ORDER IN 25'S) (MISCELLANEOUS) ×1
BASKET HEART (ORDER IN 25S) (MISCELLANEOUS) ×2 IMPLANT
BLADE CLIPPER SURG (BLADE) ×1 IMPLANT
BLADE STERNUM SYSTEM 6 (BLADE) ×3 IMPLANT
BLADE SURG 11 STRL SS (BLADE) ×1 IMPLANT
BNDG GAUZE ELAST 4 BULKY (GAUZE/BANDAGES/DRESSINGS) ×3 IMPLANT
CANISTER SUCT 3000ML PPV (MISCELLANEOUS) ×3 IMPLANT
CATH ROBINSON RED A/P 18FR (CATHETERS) ×6 IMPLANT
CATH THORACIC 28FR (CATHETERS) ×3 IMPLANT
CATH THORACIC 36FR (CATHETERS) ×3 IMPLANT
CATH THORACIC 36FR RT ANG (CATHETERS) ×3 IMPLANT
CLIP TI MEDIUM 24 (CLIP) IMPLANT
CLIP TI WIDE RED SMALL 24 (CLIP) ×1 IMPLANT
CRADLE DONUT ADULT HEAD (MISCELLANEOUS) ×3 IMPLANT
DRAPE CARDIOVASCULAR INCISE (DRAPES) ×3
DRAPE SLUSH/WARMER DISC (DRAPES) ×3 IMPLANT
DRAPE SRG 135X102X78XABS (DRAPES) ×2 IMPLANT
DRSG COVADERM 4X14 (GAUZE/BANDAGES/DRESSINGS) ×3 IMPLANT
ELECT CAUTERY BLADE 6.4 (BLADE) ×3 IMPLANT
ELECT REM PT RETURN 9FT ADLT (ELECTROSURGICAL) ×6
ELECTRODE REM PT RTRN 9FT ADLT (ELECTROSURGICAL) ×4 IMPLANT
FELT TEFLON 1X6 (MISCELLANEOUS) ×6 IMPLANT
GAUZE SPONGE 4X4 12PLY STRL (GAUZE/BANDAGES/DRESSINGS) ×6 IMPLANT
GLOVE BIO SURGEON STRL SZ 6.5 (GLOVE) ×4 IMPLANT
GLOVE BIO SURGEON STRL SZ7 (GLOVE) ×2 IMPLANT
GLOVE BIO SURGEON STRL SZ7.5 (GLOVE) ×4 IMPLANT
GLOVE BIOGEL PI IND STRL 6 (GLOVE) IMPLANT
GLOVE BIOGEL PI IND STRL 6.5 (GLOVE) IMPLANT
GLOVE BIOGEL PI IND STRL 7.0 (GLOVE) IMPLANT
GLOVE BIOGEL PI IND STRL 8.5 (GLOVE) IMPLANT
GLOVE BIOGEL PI INDICATOR 6 (GLOVE) ×4
GLOVE BIOGEL PI INDICATOR 6.5 (GLOVE) ×4
GLOVE BIOGEL PI INDICATOR 7.0 (GLOVE) ×4
GLOVE BIOGEL PI INDICATOR 8.5 (GLOVE) ×1
GLOVE EUDERMIC 7 POWDERFREE (GLOVE) ×6 IMPLANT
GLOVE ORTHO TXT STRL SZ7.5 (GLOVE) IMPLANT
GOWN STRL REUS W/ TWL LRG LVL3 (GOWN DISPOSABLE) ×8 IMPLANT
GOWN STRL REUS W/ TWL XL LVL3 (GOWN DISPOSABLE) ×2 IMPLANT
GOWN STRL REUS W/TWL LRG LVL3 (GOWN DISPOSABLE) ×30
GOWN STRL REUS W/TWL XL LVL3 (GOWN DISPOSABLE) ×3
HEMOSTAT POWDER SURGIFOAM 1G (HEMOSTASIS) ×9 IMPLANT
HEMOSTAT SURGICEL 2X14 (HEMOSTASIS) ×3 IMPLANT
INSERT FOGARTY 61MM (MISCELLANEOUS) IMPLANT
INSERT FOGARTY XLG (MISCELLANEOUS) IMPLANT
KIT BASIN OR (CUSTOM PROCEDURE TRAY) ×3 IMPLANT
KIT CATH CPB BARTLE (MISCELLANEOUS) ×3 IMPLANT
KIT ROOM TURNOVER OR (KITS) ×3 IMPLANT
KIT SUCTION CATH 14FR (SUCTIONS) ×3 IMPLANT
KIT VASOVIEW HEMOPRO VH 3000 (KITS) ×3 IMPLANT
NS IRRIG 1000ML POUR BTL (IV SOLUTION) ×15 IMPLANT
PACK OPEN HEART (CUSTOM PROCEDURE TRAY) ×3 IMPLANT
PAD ARMBOARD 7.5X6 YLW CONV (MISCELLANEOUS) ×6 IMPLANT
PAD ELECT DEFIB RADIOL ZOLL (MISCELLANEOUS) ×3 IMPLANT
PENCIL BUTTON HOLSTER BLD 10FT (ELECTRODE) ×3 IMPLANT
PUNCH AORTIC ROTATE 4.0MM (MISCELLANEOUS) IMPLANT
PUNCH AORTIC ROTATE 4.5MM 8IN (MISCELLANEOUS) ×3 IMPLANT
PUNCH AORTIC ROTATE 5MM 8IN (MISCELLANEOUS) IMPLANT
SET CARDIOPLEGIA MPS 5001102 (MISCELLANEOUS) ×1 IMPLANT
SOLUTION ANTI FOG 6CC (MISCELLANEOUS) ×1 IMPLANT
SPONGE LAP 18X18 X RAY DECT (DISPOSABLE) IMPLANT
SPONGE LAP 4X18 X RAY DECT (DISPOSABLE) ×3 IMPLANT
SUT BONE WAX W31G (SUTURE) ×3 IMPLANT
SUT MNCRL AB 4-0 PS2 18 (SUTURE) ×2 IMPLANT
SUT PROLENE 3 0 SH1 36 (SUTURE) ×3 IMPLANT
SUT PROLENE 4 0 RB 1 (SUTURE) ×3
SUT PROLENE 4-0 RB1 .5 CRCL 36 (SUTURE) IMPLANT
SUT PROLENE 6 0 C 1 30 (SUTURE) ×3 IMPLANT
SUT PROLENE 7 0 BV1 MDA (SUTURE) ×3 IMPLANT
SUT STEEL SZ 6 DBL 3X14 BALL (SUTURE) ×3 IMPLANT
SUT VIC AB 1 CTX 36 (SUTURE) ×9
SUT VIC AB 1 CTX36XBRD ANBCTR (SUTURE) ×4 IMPLANT
SUT VIC AB 2-0 CT1 27 (SUTURE) ×3
SUT VIC AB 2-0 CT1 TAPERPNT 27 (SUTURE) IMPLANT
SUTURE E-PAK OPEN HEART (SUTURE) ×3 IMPLANT
SYSTEM SAHARA CHEST DRAIN ATS (WOUND CARE) ×3 IMPLANT
TAPE CLOTH SURG 4X10 WHT LF (GAUZE/BANDAGES/DRESSINGS) ×1 IMPLANT
TAPE PAPER 2X10 WHT MICROPORE (GAUZE/BANDAGES/DRESSINGS) ×1 IMPLANT
TOWEL GREEN STERILE (TOWEL DISPOSABLE) ×9 IMPLANT
TRAY FOLEY SILVER 16FR TEMP (SET/KITS/TRAYS/PACK) ×3 IMPLANT
TUBING INSUFFLATION (TUBING) ×3 IMPLANT
UNDERPAD 30X30 (UNDERPADS AND DIAPERS) ×3 IMPLANT
WATER STERILE IRR 1000ML POUR (IV SOLUTION) ×6 IMPLANT

## 2017-04-21 NOTE — Anesthesia Postprocedure Evaluation (Signed)
Anesthesia Post Note  Patient: Sara [name]  Procedure(s) Performed: Procedure(s) (LRB): CORONARY ARTERY BYPASS GRAFTING (CABG) x 2, USING LEFT MAMMARY ARTERY AND RIGHT GREATER SAPHENOUS VEIN HARVESTED ENDOSCOPICALLY (N/A) TRANSESOPHAGEAL ECHOCARDIOGRAM (TEE) (N/A)     Patient location during evaluation: SICU Anesthesia Type: General Level of consciousness: patient remains intubated per anesthesia plan Pain management: pain level controlled Vital Signs Assessment: post-procedure vital signs reviewed and stable Respiratory status: patient remains intubated per anesthesia plan Cardiovascular status: stable Anesthetic complications: no    Last Vitals:  Vitals:   04/21/17 1445 04/21/17 1500  BP: 91/65 95/66  Pulse: 77 77  Resp: 12 12  Temp: 36.2 C 36.3 C    Last Pain:  Vitals:   04/21/17 1200  TempSrc: Core (Comment)  PainSc:                  Kareen Jefferys

## 2017-04-21 NOTE — Anesthesia Procedure Notes (Signed)
Performed by: Keyron Pokorski       

## 2017-04-21 NOTE — Anesthesia Procedure Notes (Signed)
Central Venous Catheter Insertion Performed by: Lillia Abed, anesthesiologist Start/End6/29/2018 6:40 AM, 04/21/2017 6:55 AM Patient location: OR. Preanesthetic checklist: patient identified, IV checked, risks and benefits discussed, surgical consent, monitors and equipment checked, pre-op evaluation, timeout performed and anesthesia consent Position: Trendelenburg Patient sedated Hand hygiene performed , maximum sterile barriers used  and Seldinger technique used PA cath and Central line was placed.MAC introducer Procedure performed using ultrasound guided technique. Ultrasound Notes:anatomy identified, needle tip was noted to be adjacent to the nerve/plexus identified, no ultrasound evidence of intravascular and/or intraneural injection and image(s) printed for medical record Attempts: 1 Following insertion, line sutured and dressing applied. Post procedure assessment: blood return through all ports, free fluid flow and no air  Patient tolerated the procedure well with no immediate complications.

## 2017-04-21 NOTE — Anesthesia Preprocedure Evaluation (Addendum)
Anesthesia Evaluation  Patient identified by MRN, date of birth, ID band Patient awake    Reviewed: Allergy & Precautions, NPO status , Patient's Chart, lab work & pertinent test results  History of Anesthesia Complications (+) PSEUDOCHOLINESTERASE DEFICIENCY  Airway Mallampati: II  TM Distance: >3 FB     Dental   Pulmonary sleep apnea ,    breath sounds clear to auscultation       Cardiovascular hypertension, + Past MI, + Peripheral Vascular Disease and +CHF   Rhythm:Regular Rate:Normal     Neuro/Psych Seizures -,     GI/Hepatic negative GI ROS, Neg liver ROS,   Endo/Other  diabetes  Renal/GU Renal disease     Musculoskeletal   Abdominal   Peds  Hematology   Anesthesia Other Findings   Reproductive/Obstetrics                             Anesthesia Physical Anesthesia Plan  ASA: IV  Anesthesia Plan: General   Post-op Pain Management:    Induction: Intravenous  PONV Risk Score and Plan: 3 and Ondansetron, Dexamethasone, Propofol, Midazolam and Treatment may vary due to age or medical condition  Airway Management Planned: Oral ETT  Additional Equipment: TEE, Arterial line and CVP  Intra-op Plan:   Post-operative Plan: Post-operative intubation/ventilation  Informed Consent:   Plan Discussed with: CRNA  Anesthesia Plan Comments:        Anesthesia Quick Evaluation

## 2017-04-21 NOTE — Anesthesia Procedure Notes (Signed)
Arterial Line Insertion Start/End6/29/2018 7:00 AM, 04/21/2017 7:15 AM Performed by: Verdie Drown, CRNA  Patient location: Pre-op. Preanesthetic checklist: patient identified, site marked, surgical consent, pre-op evaluation and timeout performed Lidocaine 1% used for infiltration and patient sedated Left, radial was placed Catheter size: 20 G Hand hygiene performed  and maximum sterile barriers used   Attempts: 1 Procedure performed without using ultrasound guided technique. Following insertion, Biopatch and dressing applied. Post procedure assessment: normal  Patient tolerated the procedure well with no immediate complications.

## 2017-04-21 NOTE — Transfer of Care (Signed)
Immediate Anesthesia Transfer of Care Note  Patient: Sara [name]  Procedure(s) Performed: Procedure(s): CORONARY ARTERY BYPASS GRAFTING (CABG) x 2, USING LEFT MAMMARY ARTERY AND RIGHT GREATER SAPHENOUS VEIN HARVESTED ENDOSCOPICALLY (N/A) TRANSESOPHAGEAL ECHOCARDIOGRAM (TEE) (N/A)  Patient Location: SICU  Anesthesia Type:General  Level of Consciousness: Patient remains intubated per anesthesia plan  Airway & Oxygen Therapy: Patient remains intubated per anesthesia plan and Patient placed on Ventilator (see vital sign flow sheet for setting)  Post-op Assessment: Report given to RN and Post -op Vital signs reviewed and stable  Post vital signs: Reviewed and stable  Last Vitals:  Vitals:   04/21/17 0549 04/21/17 1205  BP: 130/81 112/69  Pulse: 85   Resp: 19 12  Temp: 36.5 C     Last Pain:  Vitals:   04/21/17 0549  TempSrc: Oral  PainSc:          Complications: No apparent anesthesia complications

## 2017-04-21 NOTE — Op Note (Signed)
CARDIOVASCULAR SURGERY OPERATIVE NOTE  04/21/2017  Surgeon:  [name], MD  First Assistant: Nicholes Rough,  PA-C   Preoperative Diagnosis:  Severe single-vessel coronary artery disease   Postoperative Diagnosis:  Same   Procedure:  1. Median Sternotomy 2. Extracorporeal circulation 3.   Coronary artery bypass grafting x 2   Left internal mammary graft to the LAD  SVG to diagonal   4.   Endoscopic vein harvest from the right leg   Anesthesia:  General Endotracheal   Clinical History/Surgical Indication:  The patient is a 61 year old woman with diabetes, hyperlipidemia, hypertension, OSA on CPAP, RAS s/p PTCA and stenting on the right, PAD, and thrombocytosis who presents with recent onset of pressure-like chest discomfort that she thought was probably indigestion. It has come and gone several times but did not improve with antacids. There has been some radiation to the left arm and neck. She ruled in for MI with an initial troponin poc of 0.11, peaking at 21.64 yesterday afternoon and 15.84 this am. Cardiac cath yesterday afternoon showed 90% proximal LAD at the origin of a diagonal that has 80% ostial stenosis and 75% proximal stenosis. The LAD has 75% mid stenosis and 40% distal stenosis at the apex. Her LVEF by echo is 45% with hypokinesis of the anteroseptal wall and apex and was normal in 2014.  This 61 year old woman with multiple cardiac risk factors presents with recent onset of anginal symptoms and a non-STEMI due to high grade LAD/diagonal stenosis seen on catheterization. Her LVEF has decreased to 45% from normal in 2014. I agree that the best treatment option for her is CABG to the LAD and diagonal. I discussed the operative procedure with the patient and her husband including alternatives, benefits and risks; including but not limited to bleeding, blood transfusion,  infection, stroke, myocardial infarction, graft failure, heart block requiring a permanent pacemaker, organ dysfunction, and death.  [name] understands and agrees to proceed.   Preparation:  The patient was seen in the preoperative holding area and the correct patient, correct operation were confirmed with the patient after reviewing the medical record and catheterization. The consent was signed by me. Preoperative antibiotics were given. A pulmonary arterial line and radial arterial line were placed by the anesthesia team. The patient was taken back to the operating room and positioned supine on the operating room table. After being placed under general endotracheal anesthesia by the anesthesia team a foley catheter was placed. The neck, chest, abdomen, and both legs were prepped with betadine soap and solution and draped in the usual sterile manner. A surgical time-out was taken and the correct patient and operative procedure were confirmed with the nursing and anesthesia staff.   Cardiopulmonary Bypass:  A median sternotomy was performed. The pericardium was opened in the midline. Right ventricular function appeared normal. The ascending aorta was of normal size and had no palpable plaque. There were no contraindications to aortic cannulation or cross-clamping. The patient was fully systemically heparinized and the ACT was maintained > 400 sec. The proximal aortic arch was cannulated with a 20 F aortic cannula for arterial inflow. Venous cannulation was performed via the right atrial appendage using a two-staged venous cannula. An antegrade cardioplegia/vent cannula was inserted into the mid-ascending aorta. Aortic occlusion was performed with a single cross-clamp. Systemic cooling to 32 degrees Centigrade and topical cooling of the heart with iced saline were used. Hyperkalemic antegrade cold blood cardioplegia was used to induce diastolic arrest  and was then given at about 20 minute intervals  throughout the period of arrest to maintain myocardial temperature at or below 10 degrees centigrade. A temperature probe was inserted into the interventricular septum and an insulating pad was placed in the pericardium.   Left internal mammary harvest:  The left side of the sternum was retracted using the Rultract retractor. The left internal mammary artery was harvested as a pedicle graft. All side branches were clipped. It was a medium-sized vessel of good quality with excellent blood flow. It was ligated distally and divided. It was sprayed with topical papaverine solution to prevent vasospasm.   Endoscopic vein harvest:  The right greater saphenous vein was harvested endoscopically through a 2 cm incision medial to the right knee. It was harvested from the thigh  It was a medium-sized vein of good quality. The side branches were all ligated with 4-0 silk ties.    Coronary arteries:  The coronary arteries were examined.   LAD:  Segmental plaque throughout. Diagonal is a moderate caliber vessel with proximal disease but no distal disease.  LCX:  No disease  RCA:  Small, non-dominant   Grafts:  1. LIMA to the LAD: 2.5 mm. It was sewn end to side using 8-0 prolene continuous suture. 2. SVG to diagonal:  1.75 mm. It was sewn end to side using 7-0 prolene continuous suture.   The proximal vein graft anastomoses were performed to the mid-ascending aorta using continuous 6-0 prolene suture. A graft marker was  placed around the proximal anastomosis.   Completion:  The patient was rewarmed to 37 degrees Centigrade. The clamp was removed from the LIMA pedicle and there was rapid warming of the septum and return of ventricular fibrillation. The crossclamp was removed with a time of 44 minutes. There was spontaneous return of sinus rhythm. The distal and proximal anastomoses were checked for hemostasis. The position of the grafts was satisfactory. Two temporary epicardial pacing wires  were placed on the right atrium and two on the right ventricle. The patient was weaned from CPB without difficulty on no inotropes. CPB time was 60 minutes. Cardiac output was 5 LPM. TEE showed normal LV function. Heparin was fully reversed with protamine and the aortic and venous cannulas removed. Hemostasis was achieved. Mediastinal and left pleural drainage tubes were placed. The sternum was closed with double #6 stainless steel wires. The fascia was closed with continuous # 1 vicryl suture. The subcutaneous tissue was closed with 2-0 vicryl continuous suture. The skin was closed with 3-0 vicryl subcuticular suture. All sponge, needle, and instrument counts were reported correct at the end of the case. Dry sterile dressings were placed over the incisions and around the chest tubes which were connected to pleurevac suction. The patient was then transported to the surgical intensive care unit in critical but stable condition.

## 2017-04-21 NOTE — Procedures (Signed)
Extubation Procedure Note  Patient Details:   Name: Sara [name] DOB: 1956/01/22 MRN: [id number]   Airway Documentation:     Evaluation  O2 sats: stable throughout Complications: No apparent complications Patient did tolerate procedure well. Bilateral Breath Sounds: Clear   Yes  Cordella Register 04/21/2017, 5:18 PM

## 2017-04-21 NOTE — Brief Op Note (Signed)
04/15/2017 - 04/21/2017  10:32 AM  PATIENT:  Sara [name]  61 y.o. female  PRE-OPERATIVE DIAGNOSIS:  CAD  POST-OPERATIVE DIAGNOSIS:  CAD  PROCEDURE:  Procedure(s): CORONARY ARTERY BYPASS GRAFTING (CABG) x 2, USING LEFT MAMMARY ARTERY AND RIGHT GREATER SAPHENOUS VEIN HARVESTED ENDOSCOPICALLY (N/A) TRANSESOPHAGEAL ECHOCARDIOGRAM (TEE) (N/A)  SURGEON:  Surgeon(s) and Role:    * Bartle, Fernande Boyden, MD - Primary  PHYSICIAN ASSISTANT:  Nicholes Rough, PA-C   ANESTHESIA:   general  EBL:  Total I/O In: 1700 [I.V.:1700] Out: 500 [Urine:400; Blood:100]  BLOOD ADMINISTERED:none  DRAINS: ROUTINE   LOCAL MEDICATIONS USED:  NONE  SPECIMEN:  No Specimen  DISPOSITION OF SPECIMEN:  N/A  COUNTS:  YES  TOURNIQUET:  * No tourniquets in log *  DICTATION: .Dragon Dictation  PLAN OF CARE: Admit to inpatient   PATIENT DISPOSITION:  ICU - intubated and hemodynamically stable.   Delay start of Pharmacological VTE agent (>24hrs) due to surgical blood loss or risk of bleeding: yes

## 2017-04-21 NOTE — Anesthesia Procedure Notes (Signed)
Procedure Name: Intubation Date/Time: 04/21/2017 7:48 AM Performed by: Izora Gala Pre-anesthesia Checklist: Patient identified, Emergency Drugs available, Suction available and Patient being monitored Patient Re-evaluated:Patient Re-evaluated prior to inductionOxygen Delivery Method: Circle system utilized Preoxygenation: Pre-oxygenation with 100% oxygen Intubation Type: IV induction Ventilation: Mask ventilation without difficulty and Oral airway inserted - appropriate to patient size Laryngoscope Size: Miller and 3 Grade View: Grade II Tube type: Oral Tube size: 7.5 mm Number of attempts: 1 Airway Equipment and Method: Stylet Placement Confirmation: ETT inserted through vocal cords under direct vision,  positive ETCO2 and breath sounds checked- equal and bilateral Secured at: 22 cm Dental Injury: Teeth and Oropharynx as per pre-operative assessment

## 2017-04-21 NOTE — Progress Notes (Signed)
  Echocardiogram 2D Echocardiogram has been performed.  Laurita Peron T Dearl Rudden 04/21/2017, 10:33 AM

## 2017-04-21 NOTE — Progress Notes (Signed)
Patient transported to OR. Daughter and husband present at bedside.

## 2017-04-22 ENCOUNTER — Encounter (HOSPITAL_COMMUNITY): Payer: Self-pay | Admitting: Cardiology

## 2017-04-22 ENCOUNTER — Inpatient Hospital Stay (HOSPITAL_COMMUNITY): Payer: BC Managed Care – PPO

## 2017-04-22 LAB — GLUCOSE, CAPILLARY
GLUCOSE-CAPILLARY: 103 mg/dL — AB (ref 65–99)
GLUCOSE-CAPILLARY: 109 mg/dL — AB (ref 65–99)
GLUCOSE-CAPILLARY: 99 mg/dL (ref 65–99)
GLUCOSE-CAPILLARY: 109 mg/dL — AB (ref 65–99)
GLUCOSE-CAPILLARY: 105 mg/dL — AB (ref 65–99)
GLUCOSE-CAPILLARY: 110 mg/dL — AB (ref 65–99)
Glucose-Capillary: 101 mg/dL — ABNORMAL HIGH (ref 65–99)
Glucose-Capillary: 116 mg/dL — ABNORMAL HIGH (ref 65–99)
Glucose-Capillary: 104 mg/dL — ABNORMAL HIGH (ref 65–99)
Glucose-Capillary: 102 mg/dL — ABNORMAL HIGH (ref 65–99)
Glucose-Capillary: 111 mg/dL — ABNORMAL HIGH (ref 65–99)
Glucose-Capillary: 124 mg/dL — ABNORMAL HIGH (ref 65–99)
Glucose-Capillary: 97 mg/dL (ref 65–99)

## 2017-04-22 LAB — CBC
HCT: 31.7 % — ABNORMAL LOW (ref 36.0–46.0)
HCT: 33.6 % — ABNORMAL LOW (ref 36.0–46.0)
Hemoglobin: 10.2 g/dL — ABNORMAL LOW (ref 12.0–15.0)
Hemoglobin: 10.8 g/dL — ABNORMAL LOW (ref 12.0–15.0)
MCH: 28.5 pg (ref 26.0–34.0)
MCH: 29 pg (ref 26.0–34.0)
MCHC: 32.2 g/dL (ref 30.0–36.0)
MCHC: 32.1 g/dL (ref 30.0–36.0)
MCV: 88.5 fL (ref 78.0–100.0)
MCV: 90.1 fL (ref 78.0–100.0)
PLATELETS: 225 10*3/uL (ref 150–400)
PLATELETS: 237 10*3/uL (ref 150–400)
RBC: 3.58 MIL/uL — ABNORMAL LOW (ref 3.87–5.11)
RBC: 3.73 MIL/uL — AB (ref 3.87–5.11)
RDW: 13.7 % (ref 11.5–15.5)
RDW: 14.2 % (ref 11.5–15.5)
WBC: 10.2 10*3/uL (ref 4.0–10.5)
WBC: 9.2 10*3/uL (ref 4.0–10.5)

## 2017-04-22 LAB — POCT I-STAT, CHEM 8
BUN: 15 mg/dL (ref 6–20)
CREATININE: 1.5 mg/dL — AB (ref 0.44–1.00)
Calcium, Ion: 1.19 mmol/L (ref 1.15–1.40)
Chloride: 98 mmol/L — ABNORMAL LOW (ref 101–111)
GLUCOSE: 143 mg/dL — AB (ref 65–99)
HEMATOCRIT: 34 % — AB (ref 36.0–46.0)
Hemoglobin: 11.6 g/dL — ABNORMAL LOW (ref 12.0–15.0)
POTASSIUM: 3.9 mmol/L (ref 3.5–5.1)
Sodium: 138 mmol/L (ref 135–145)
TCO2: 25 mmol/L (ref 0–100)

## 2017-04-22 LAB — CREATININE, SERUM
CREATININE: 1.49 mg/dL — AB (ref 0.44–1.00)
GFR calc Af Amer: 42 mL/min — ABNORMAL LOW (ref >60)
GFR calc non Af Amer: 36 mL/min — ABNORMAL LOW (ref >60)

## 2017-04-22 LAB — MAGNESIUM
MAGNESIUM: 2.4 mg/dL (ref 1.7–2.4)
Magnesium: 2.3 mg/dL (ref 1.7–2.4)

## 2017-04-22 LAB — BASIC METABOLIC PANEL
ANION GAP: 5 (ref 5–15)
BUN: 13 mg/dL (ref 6–20)
CO2: 24 mmol/L (ref 22–32)
Calcium: 8.4 mg/dL — ABNORMAL LOW (ref 8.9–10.3)
Chloride: 109 mmol/L (ref 101–111)
Creatinine, Ser: 1.22 mg/dL — ABNORMAL HIGH (ref 0.44–1.00)
GFR, EST AFRICAN AMERICAN: 54 mL/min — AB (ref >60)
GFR, EST NON AFRICAN AMERICAN: 46 mL/min — AB (ref >60)
Glucose, Bld: 106 mg/dL — ABNORMAL HIGH (ref 65–99)
POTASSIUM: 4.4 mmol/L (ref 3.5–5.1)
SODIUM: 138 mmol/L (ref 135–145)

## 2017-04-22 MED ORDER — INSULIN ASPART 100 UNIT/ML ~~LOC~~ SOLN
0.0000 [IU] | SUBCUTANEOUS | Status: DC
Start: 2017-04-22 — End: 2017-04-23
  Administered 2017-04-22 – 2017-04-23 (×3): 2 [IU] via SUBCUTANEOUS

## 2017-04-22 MED ORDER — INSULIN DETEMIR 100 UNIT/ML ~~LOC~~ SOLN
15.0000 [IU] | Freq: Every day | SUBCUTANEOUS | Status: DC
  Administered 2017-04-22 – 2017-04-23 (×2): 15 [IU] via SUBCUTANEOUS
  Filled 2017-04-22 (×3): qty 0.15

## 2017-04-22 MED ORDER — ENOXAPARIN SODIUM 40 MG/0.4ML ~~LOC~~ SOLN
40.0000 mg | Freq: Every day | SUBCUTANEOUS | Status: DC
  Administered 2017-04-22 – 2017-04-25 (×4): 40 mg via SUBCUTANEOUS
  Filled 2017-04-22 (×4): qty 0.4

## 2017-04-22 MED ORDER — FUROSEMIDE 10 MG/ML IJ SOLN
40.0000 mg | Freq: Once | INTRAMUSCULAR | Status: AC
  Administered 2017-04-22: 40 mg via INTRAVENOUS
  Filled 2017-04-22: qty 4

## 2017-04-22 NOTE — Progress Notes (Signed)
1 Day Post-Op Procedure(s) (LRB): CORONARY ARTERY BYPASS GRAFTING (CABG) x 2, USING LEFT MAMMARY ARTERY AND RIGHT GREATER SAPHENOUS VEIN HARVESTED ENDOSCOPICALLY (N/A) TRANSESOPHAGEAL ECHOCARDIOGRAM (TEE) (N/A) Subjective:  No complaints  Objective: Vital signs in last 24 hours: Temp:  [95.7 F (35.4 C)-99 F (37.2 C)] 98.4 F (36.9 C) (06/30 0700) Pulse Rate:  [73-111] 89 (06/30 0837) Cardiac Rhythm: Normal sinus rhythm (06/30 0600) Resp:  [11-32] 13 (06/30 0700) BP: (80-131)/(41-84) 114/62 (06/30 0837) SpO2:  [92 %-100 %] 97 % (06/30 0700) Arterial Line BP: (84-165)/(48-99) 102/56 (06/30 0700) FiO2 (%):  [40 %-50 %] 40 % (06/29 1700) Weight:  [104.6 kg (230 lb 9.6 oz)] 104.6 kg (230 lb 9.6 oz) (06/30 0530)  Hemodynamic parameters for last 24 hours: PAP: (27-55)/(14-36) 31/24 CO:  [1.6 L/min-4.5 L/min] 3.9 L/min CI:  [1.3 L/min/m2-2.3 L/min/m2] 2 L/min/m2  Intake/Output from previous day: 06/29 0701 - 06/30 0700 In: 4535.9 [I.V.:2765.9; Blood:320; IV Piggyback:1450] Out: 2545 [Urine:1955; Blood:100; Chest Tube:490] Intake/Output this shift: No intake/output data recorded.  General appearance: alert and cooperative Neurologic: intact Heart: regular rate and rhythm, S1, S2 normal, no murmur, click, rub or gallop Lungs: clear to auscultation bilaterally Extremities: edema mild Wound: dressings dry  Lab Results:  Recent Labs  04/21/17 1814 04/21/17 1819 04/22/17 0306  WBC 9.3  --  10.2  HGB 10.7* 11.2* 10.2*  HCT 33.2* 33.0* 31.7*  PLT 241  --  225   BMET:  Recent Labs  04/21/17 0526  04/21/17 1819 04/22/17 0306  NA 140  < > 141 138  K 3.9  < > 4.6 4.4  CL 105  < > 105 109  CO2 26  --   --  24  GLUCOSE 104*  < > 113* 106*  BUN 16  < > 15 13  CREATININE 1.21*  < > 1.10* 1.22*  CALCIUM 9.5  --   --  8.4*  < > = values in this interval not displayed.  PT/INR:  Recent Labs  04/21/17 1202  LABPROT 16.1*  INR 1.29   ABG    Component Value Date/Time    PHART 7.319 (L) 04/21/2017 1825   HCO3 23.4 04/21/2017 1825   TCO2 25 04/21/2017 1825   ACIDBASEDEF 3.0 (H) 04/21/2017 1825   O2SAT 97.0 04/21/2017 1825   CBG (last 3)   Recent Labs  04/22/17 0522 04/22/17 0605 04/22/17 0733  GLUCAP 109* 105* 110*   CXR: ok  ECG: NSR, no acute changes  Assessment/Plan: S/P Procedure(s) (LRB): CORONARY ARTERY BYPASS GRAFTING (CABG) x 2, USING LEFT MAMMARY ARTERY AND RIGHT GREATER SAPHENOUS VEIN HARVESTED ENDOSCOPICALLY (N/A) TRANSESOPHAGEAL ECHOCARDIOGRAM (TEE) (N/A)  Hemodynamically stable in sinus rhythm Mobilize Diuresis Diabetes control: preop Hgb A1c 6.6. Start Levemir and SSI. Needs dietary education, exercise and weight loss. d/c tubes/lines Continue foley due to diuresing patient and patient in ICU See progression orders   LOS: 7 days    Sara [name] 04/22/2017

## 2017-04-22 NOTE — Progress Notes (Signed)
Pt sitting in chair speaking, suddenly silent, pt with lip smacking, upward gazing of eyes and unresponsive for approximately 2 minutes.  O2 applied, notified Dr. Herbie Drape at bedside--activity stopped on its own.  Pt postictal for 20 minutes with return to baseline A&Ox4.

## 2017-04-23 ENCOUNTER — Inpatient Hospital Stay (HOSPITAL_COMMUNITY): Payer: BC Managed Care – PPO

## 2017-04-23 LAB — BASIC METABOLIC PANEL
ANION GAP: 6 (ref 5–15)
BUN: 13 mg/dL (ref 6–20)
CHLORIDE: 101 mmol/L (ref 101–111)
CO2: 26 mmol/L (ref 22–32)
Calcium: 8.5 mg/dL — ABNORMAL LOW (ref 8.9–10.3)
Creatinine, Ser: 1.34 mg/dL — ABNORMAL HIGH (ref 0.44–1.00)
GFR calc non Af Amer: 41 mL/min — ABNORMAL LOW (ref >60)
GFR, EST AFRICAN AMERICAN: 48 mL/min — AB (ref >60)
Glucose, Bld: 151 mg/dL — ABNORMAL HIGH (ref 65–99)
POTASSIUM: 3.8 mmol/L (ref 3.5–5.1)
SODIUM: 133 mmol/L — AB (ref 135–145)

## 2017-04-23 LAB — CBC
HEMATOCRIT: 31.8 % — AB (ref 36.0–46.0)
HEMOGLOBIN: 10 g/dL — AB (ref 12.0–15.0)
MCH: 28.2 pg (ref 26.0–34.0)
MCHC: 31.4 g/dL (ref 30.0–36.0)
MCV: 89.8 fL (ref 78.0–100.0)
Platelets: 218 10*3/uL (ref 150–400)
RBC: 3.54 MIL/uL — AB (ref 3.87–5.11)
RDW: 14.1 % (ref 11.5–15.5)
WBC: 8.7 10*3/uL (ref 4.0–10.5)

## 2017-04-23 LAB — GLUCOSE, CAPILLARY
GLUCOSE-CAPILLARY: 144 mg/dL — AB (ref 65–99)
GLUCOSE-CAPILLARY: 81 mg/dL (ref 65–99)
Glucose-Capillary: 72 mg/dL (ref 65–99)
Glucose-Capillary: 154 mg/dL — ABNORMAL HIGH (ref 65–99)
Glucose-Capillary: 135 mg/dL — ABNORMAL HIGH (ref 65–99)
Glucose-Capillary: 98 mg/dL (ref 65–99)

## 2017-04-23 MED ORDER — LACOSAMIDE 50 MG PO TABS
150.0000 mg | ORAL_TABLET | Freq: Every day | ORAL | Status: DC
  Administered 2017-04-23: 150 mg via ORAL
  Filled 2017-04-23: qty 3

## 2017-04-23 MED ORDER — INSULIN ASPART 100 UNIT/ML ~~LOC~~ SOLN: 0.0000 [IU] | Freq: Three times a day (TID) | SUBCUTANEOUS | Status: DC

## 2017-04-23 MED ORDER — POTASSIUM CHLORIDE CRYS ER 20 MEQ PO TBCR
20.0000 meq | EXTENDED_RELEASE_TABLET | Freq: Three times a day (TID) | ORAL | Status: DC
  Administered 2017-04-23 (×3): 20 meq via ORAL
  Filled 2017-04-23 (×3): qty 1

## 2017-04-23 MED ORDER — FUROSEMIDE 10 MG/ML IJ SOLN
INTRAMUSCULAR | Status: AC
  Administered 2017-04-23: 40 mg
  Filled 2017-04-23: qty 4

## 2017-04-23 MED ORDER — LACOSAMIDE 50 MG PO TABS
50.0000 mg | ORAL_TABLET | Freq: Every day | ORAL | Status: DC
  Administered 2017-04-24 – 2017-04-26 (×3): 50 mg via ORAL
  Filled 2017-04-23 (×3): qty 1

## 2017-04-23 MED ORDER — FUROSEMIDE 10 MG/ML IJ SOLN
40.0000 mg | Freq: Once | INTRAMUSCULAR | Status: AC
  Administered 2017-04-23: 40 mg via INTRAVENOUS
  Filled 2017-04-23: qty 4

## 2017-04-23 NOTE — Plan of Care (Signed)
Problem: Activity: Goal: Risk for activity intolerance will decrease Outcome: Progressing Pt ambulated today with little assistance  Problem: Bowel/Gastric: Goal: Gastrointestinal status for postoperative course will improve Outcome: Progressing Passing flatus, no BM yet  Problem: Nutritional: Goal: Risk for body nutrition deficit will decrease Outcome: Progressing Pt ate well today

## 2017-04-23 NOTE — Progress Notes (Signed)
2 Days Post-Op Procedure(s) (LRB): CORONARY ARTERY BYPASS GRAFTING (CABG) x 2, USING LEFT MAMMARY ARTERY AND RIGHT GREATER SAPHENOUS VEIN HARVESTED ENDOSCOPICALLY (N/A) TRANSESOPHAGEAL ECHOCARDIOGRAM (TEE) (N/A) Subjective:  No complaints.   She had a seizure yesterday that I witnessed. Became unresponsive for a few minutes but otherwise ok and was confused when she came out of it but back to normal fairly quickly.   Objective: Vital signs in last 24 hours: Temp:  [98.4 F (36.9 C)-98.6 F (37 C)] 98.6 F (37 C) (07/01 0300) Pulse Rate:  [82-110] 110 (07/01 0917) Cardiac Rhythm: Normal sinus rhythm (06/30 2000) Resp:  [11-25] 13 (07/01 0700) BP: (101-144)/(55-86) 103/63 (07/01 0917) SpO2:  [90 %-100 %] 94 % (07/01 0700) Weight:  [104.7 kg (230 lb 12.8 oz)] 104.7 kg (230 lb 12.8 oz) (07/01 0315)  Hemodynamic parameters for last 24 hours:    Intake/Output from previous day: 06/30 0701 - 07/01 0700 In: 731 [P.O.:390; I.V.:241; IV Piggyback:100] Out: 2870 [Urine:2870] Intake/Output this shift: No intake/output data recorded.  General appearance: alert and cooperative Neurologic: intact Heart: regular rate and rhythm, S1, S2 normal, no murmur, click, rub or gallop Lungs: clear to auscultation bilaterally Extremities: edema mild Wound: dressing dry  Lab Results:  Recent Labs  04/22/17 1715 04/23/17 0418  WBC 9.2 8.7  HGB 10.8* 10.0*  HCT 33.6* 31.8*  PLT 237 218   BMET:  Recent Labs  04/22/17 0306 04/22/17 1712 04/22/17 1715 04/23/17 0418  NA 138 138  --  133*  K 4.4 3.9  --  3.8  CL 109 98*  --  101  CO2 24  --   --  26  GLUCOSE 106* 143*  --  151*  BUN 13 15  --  13  CREATININE 1.22* 1.50* 1.49* 1.34*  CALCIUM 8.4*  --   --  8.5*    PT/INR:  Recent Labs  04/21/17 1202  LABPROT 16.1*  INR 1.29   ABG    Component Value Date/Time   PHART 7.319 (L) 04/21/2017 1825   HCO3 23.4 04/21/2017 1825   TCO2 25 04/22/2017 1712   ACIDBASEDEF 3.0 (H)  04/21/2017 1825   O2SAT 97.0 04/21/2017 1825   CBG (last 3)   Recent Labs  04/23/17 0309 04/23/17 0428 04/23/17 0736  GLUCAP 72 154* 144*   CXR: ok  Assessment/Plan: S/P Procedure(s) (LRB): CORONARY ARTERY BYPASS GRAFTING (CABG) x 2, USING LEFT MAMMARY ARTERY AND RIGHT GREATER SAPHENOUS VEIN HARVESTED ENDOSCOPICALLY (N/A) TRANSESOPHAGEAL ECHOCARDIOGRAM (TEE) (N/A)  She is hemodynamically stable in sinus rhythm  Volume excess: continue diuresis  DM: glucose under control  Seizure disorder: complex partial seizures. Continue [name] at home dose.  Continue IS and ambulation  DC foley and sleeve.  Will keep in ICU today to mobilize and observe with seizure yesterday.   LOS: 8 days    Sara [name] 04/23/2017

## 2017-04-23 NOTE — Plan of Care (Signed)
Problem: Bowel/Gastric: Goal: Gastrointestinal status for postoperative course will improve Outcome: Progressing No BM yet but passing flatus and belly more comfortable tonight  Problem: Respiratory: Goal: Respiratory status will improve Outcome: Progressing Doing well with IS and C, DB--making progress

## 2017-04-24 ENCOUNTER — Encounter (HOSPITAL_COMMUNITY): Payer: Self-pay | Admitting: Surgery

## 2017-04-24 LAB — BASIC METABOLIC PANEL
ANION GAP: 7 (ref 5–15)
BUN: 12 mg/dL (ref 6–20)
CHLORIDE: 107 mmol/L (ref 101–111)
CO2: 25 mmol/L (ref 22–32)
Calcium: 8.6 mg/dL — ABNORMAL LOW (ref 8.9–10.3)
Creatinine, Ser: 1.13 mg/dL — ABNORMAL HIGH (ref 0.44–1.00)
GFR calc non Af Amer: 51 mL/min — ABNORMAL LOW (ref >60)
GFR, EST AFRICAN AMERICAN: 59 mL/min — AB (ref >60)
GLUCOSE: 85 mg/dL (ref 65–99)
Potassium: 4.4 mmol/L (ref 3.5–5.1)
Sodium: 139 mmol/L (ref 135–145)

## 2017-04-24 LAB — GLUCOSE, CAPILLARY
GLUCOSE-CAPILLARY: 86 mg/dL (ref 65–99)
Glucose-Capillary: 118 mg/dL — ABNORMAL HIGH (ref 65–99)
Glucose-Capillary: 118 mg/dL — ABNORMAL HIGH (ref 65–99)
Glucose-Capillary: 122 mg/dL — ABNORMAL HIGH (ref 65–99)

## 2017-04-24 MED ORDER — MOVING RIGHT ALONG BOOK
Freq: Once | Status: AC
  Administered 2017-04-24: 09:00:00
  Filled 2017-04-24: qty 1

## 2017-04-24 MED ORDER — OXYCODONE HCL 5 MG PO TABS: 5.0000 mg | ORAL_TABLET | ORAL | Status: DC | PRN

## 2017-04-24 MED ORDER — FUROSEMIDE 40 MG PO TABS
40.0000 mg | ORAL_TABLET | Freq: Every day | ORAL | Status: AC
  Administered 2017-04-24 – 2017-04-25 (×2): 40 mg via ORAL
  Filled 2017-04-24 (×2): qty 1

## 2017-04-24 MED ORDER — PANTOPRAZOLE SODIUM 40 MG PO TBEC
40.0000 mg | DELAYED_RELEASE_TABLET | Freq: Every day | ORAL | Status: DC
  Administered 2017-04-24 – 2017-04-26 (×3): 40 mg via ORAL
  Filled 2017-04-24 (×3): qty 1

## 2017-04-24 MED ORDER — ACETAMINOPHEN 325 MG PO TABS: 650.0000 mg | ORAL_TABLET | Freq: Four times a day (QID) | ORAL | Status: DC | PRN

## 2017-04-24 MED ORDER — METOPROLOL TARTRATE 5 MG/5ML IV SOLN
5.0000 mg | Freq: Once | INTRAVENOUS | Status: AC
  Administered 2017-04-24: 5 mg via INTRAVENOUS
  Filled 2017-04-24: qty 5

## 2017-04-24 MED ORDER — BISACODYL 5 MG PO TBEC: 10.0000 mg | DELAYED_RELEASE_TABLET | Freq: Every day | ORAL | Status: DC | PRN

## 2017-04-24 MED ORDER — INSULIN ASPART 100 UNIT/ML ~~LOC~~ SOLN: 0.0000 [IU] | Freq: Three times a day (TID) | SUBCUTANEOUS | Status: DC

## 2017-04-24 MED ORDER — LACOSAMIDE 50 MG PO TABS
150.0000 mg | ORAL_TABLET | Freq: Every day | ORAL | Status: DC
  Administered 2017-04-24 – 2017-04-25 (×2): 150 mg via ORAL
  Filled 2017-04-24 (×2): qty 3

## 2017-04-24 MED ORDER — SODIUM CHLORIDE 0.9 % IV SOLN: 250.0000 mL | INTRAVENOUS | Status: DC | PRN

## 2017-04-24 MED ORDER — METOPROLOL TARTRATE 25 MG PO TABS
25.0000 mg | ORAL_TABLET | Freq: Two times a day (BID) | ORAL | Status: DC
  Administered 2017-04-24 (×2): 25 mg via ORAL
  Filled 2017-04-24 (×2): qty 1

## 2017-04-24 MED ORDER — ONDANSETRON HCL 4 MG/2ML IJ SOLN: 4.0000 mg | Freq: Four times a day (QID) | INTRAMUSCULAR | Status: DC | PRN

## 2017-04-24 MED ORDER — SODIUM CHLORIDE 0.9% FLUSH
3.0000 mL | Freq: Two times a day (BID) | INTRAVENOUS | Status: DC
  Administered 2017-04-24 – 2017-04-26 (×3): 3 mL via INTRAVENOUS

## 2017-04-24 MED ORDER — DOCUSATE SODIUM 100 MG PO CAPS
200.0000 mg | ORAL_CAPSULE | Freq: Every day | ORAL | Status: DC
  Administered 2017-04-24 – 2017-04-26 (×3): 200 mg via ORAL
  Filled 2017-04-24 (×3): qty 2

## 2017-04-24 MED ORDER — POTASSIUM CHLORIDE CRYS ER 20 MEQ PO TBCR
20.0000 meq | EXTENDED_RELEASE_TABLET | Freq: Two times a day (BID) | ORAL | Status: AC
  Administered 2017-04-24 – 2017-04-25 (×4): 20 meq via ORAL
  Filled 2017-04-24 (×4): qty 1

## 2017-04-24 MED ORDER — TRAMADOL HCL 50 MG PO TABS
50.0000 mg | ORAL_TABLET | ORAL | Status: DC | PRN
  Administered 2017-04-24: 50 mg via ORAL
  Filled 2017-04-24: qty 1

## 2017-04-24 MED ORDER — BISACODYL 10 MG RE SUPP: 10.0000 mg | Freq: Every day | RECTAL | Status: DC | PRN

## 2017-04-24 MED ORDER — METOPROLOL TARTRATE 5 MG/5ML IV SOLN: 5.0000 mg | Freq: Four times a day (QID) | INTRAVENOUS | Status: DC | PRN

## 2017-04-24 MED ORDER — SODIUM CHLORIDE 0.9% FLUSH
3.0000 mL | INTRAVENOUS | Status: DC | PRN
Start: 2017-04-24 — End: 2017-04-26

## 2017-04-24 MED ORDER — LIVING WELL WITH DIABETES BOOK
Freq: Once | Status: AC
  Administered 2017-04-24: 11:00:00
  Filled 2017-04-24: qty 1

## 2017-04-24 MED ORDER — ONDANSETRON HCL 4 MG PO TABS: 4.0000 mg | ORAL_TABLET | Freq: Four times a day (QID) | ORAL | Status: DC | PRN

## 2017-04-24 MED ORDER — ASPIRIN EC 325 MG PO TBEC
325.0000 mg | DELAYED_RELEASE_TABLET | Freq: Every day | ORAL | Status: DC
  Administered 2017-04-24 – 2017-04-26 (×3): 325 mg via ORAL
  Filled 2017-04-24 (×3): qty 1

## 2017-04-24 MED FILL — Gelatin Absorbable MT Powder: OROMUCOSAL | Qty: 3 | Status: AC

## 2017-04-24 NOTE — Plan of Care (Signed)
Attempted to call report to 2W 

## 2017-04-24 NOTE — Plan of Care (Signed)
Transferred to 2W16 via [place] and monitor, placed in chair RN to receive in room SCD's with pt

## 2017-04-24 NOTE — Plan of Care (Signed)
Report to 2 W RN 

## 2017-04-24 NOTE — Progress Notes (Signed)
Attempted to call report to 2W 

## 2017-04-24 NOTE — Progress Notes (Addendum)
3 Days Post-Op Procedure(s) (LRB): CORONARY ARTERY BYPASS GRAFTING (CABG) x 2, USING LEFT MAMMARY ARTERY AND RIGHT GREATER SAPHENOUS VEIN HARVESTED ENDOSCOPICALLY (N/A) TRANSESOPHAGEAL ECHOCARDIOGRAM (TEE) (N/A) Subjective: No complaints.  No further seizures  Objective: Vital signs in last 24 hours: Temp:  [97.7 F (36.5 C)-98.4 F (36.9 C)] 97.7 F (36.5 C) (07/02 0500) Pulse Rate:  [97-117] 105 (07/02 0800) Cardiac Rhythm: Normal sinus rhythm (07/01 1930) Resp:  [14-27] 17 (07/02 0800) BP: (87-158)/(38-90) 136/73 (07/02 0800) SpO2:  [90 %-96 %] 95 % (07/02 0800) Weight:  [102.6 kg (226 lb 1.6 oz)] 102.6 kg (226 lb 1.6 oz) (07/02 0500)  Hemodynamic parameters for last 24 hours:    Intake/Output from previous day: 07/01 0701 - 07/02 0700 In: 1020 [P.O.:1020] Out: 3025 [Urine:2125; Stool:900] Intake/Output this shift: No intake/output data recorded.  General appearance: alert and cooperative Neurologic: intact Heart: regular rate and rhythm, S1, S2 normal, no murmur, click, rub or gallop Lungs: clear to auscultation bilaterally Extremities: edema mild Wound: dressings dry  Lab Results:  Recent Labs  04/22/17 1715 04/23/17 0418  WBC 9.2 8.7  HGB 10.8* 10.0*  HCT 33.6* 31.8*  PLT 237 218   BMET:  Recent Labs  04/23/17 0418 04/24/17 0552  NA 133* 139  K 3.8 4.4  CL 101 107  CO2 26 25  GLUCOSE 151* 85  BUN 13 12  CREATININE 1.34* 1.13*  CALCIUM 8.5* 8.6*    PT/INR:  Recent Labs  04/21/17 1202  LABPROT 16.1*  INR 1.29   ABG    Component Value Date/Time   PHART 7.319 (L) 04/21/2017 1825   HCO3 23.4 04/21/2017 1825   TCO2 25 04/22/2017 1712   ACIDBASEDEF 3.0 (H) 04/21/2017 1825   O2SAT 97.0 04/21/2017 1825   CBG (last 3)   Recent Labs  04/23/17 1237 04/23/17 1656 04/23/17 2155  GLUCAP 81 135* 98    Assessment/Plan: S/P Procedure(s) (LRB): CORONARY ARTERY BYPASS GRAFTING (CABG) x 2, USING LEFT MAMMARY ARTERY AND RIGHT GREATER  SAPHENOUS VEIN HARVESTED ENDOSCOPICALLY (N/A) TRANSESOPHAGEAL ECHOCARDIOGRAM (TEE) (N/A)  She is hemodynamically stable in sinus rhythm. Mild sinus tachycardia. Will increase Lopressor.  Mild volume excess: continue diuretic  Transfer to 2W and continue mobilization  DM: her glucose has been under fairly good control although her Hgb A1c was 6.6 preop. This may have been due to her CAD. I think this can be followed up as outpt.  Probably home Wednesday    LOS: 9 days    [name] 04/24/2017

## 2017-04-24 NOTE — Progress Notes (Signed)
Briefly spoke to patient regarding A1C.  She states that she does have history of diabetes but has not been on medications and has been able to keep A1C in the 6% range.  Blood sugars well controlled in the hospital.  Ordered Living well with diabetes booklet for patient.  Will follow.  Thanks, Adah Perl, RN, BC-ADM Inpatient Diabetes Coordinator Pager [phone] (8a-5p)

## 2017-04-24 NOTE — Progress Notes (Signed)
CARDIAC REHAB PHASE I   PRE:  Rate/Rhythm: 105 ST  BP:  Supine:   Sitting: 124/80  Standing:    SaO2: 96%RA  MODE:  Ambulation: 550 ft   POST:  Rate/Rhythm: 127 ST  BP:  Supine:   Sitting: 140/80  Standing:    SaO2: 94%RA 1345-1408 Pt walked 550 ft with hand held asst with steady gait. Tolerated well. Can walk with staff or family. In very good mood. To recliner after walk.    Graylon Good, RN BSN  04/24/2017 2:04 PM

## 2017-04-25 LAB — GLUCOSE, CAPILLARY
GLUCOSE-CAPILLARY: 119 mg/dL — AB (ref 65–99)
GLUCOSE-CAPILLARY: 123 mg/dL — AB (ref 65–99)
Glucose-Capillary: 96 mg/dL (ref 65–99)
Glucose-Capillary: 95 mg/dL (ref 65–99)

## 2017-04-25 MED ORDER — METOPROLOL TARTRATE 25 MG PO TABS
37.5000 mg | ORAL_TABLET | Freq: Two times a day (BID) | ORAL | Status: DC
  Administered 2017-04-25 (×2): 37.5 mg via ORAL
  Filled 2017-04-25 (×3): qty 1

## 2017-04-25 NOTE — Discharge Instructions (Signed)

## 2017-04-25 NOTE — Progress Notes (Signed)
Progress Note  Patient Name: Sara [name] Date of Encounter: 04/25/2017  Primary Cardiologist: Croitoru  Subjective   Feeling well. Expected incisional soreness. No other complaints.   Inpatient Medications    Scheduled Meds: . aspirin EC  325 mg Oral Daily  . atorvastatin  10 mg Oral q1800  . docusate sodium  200 mg Oral Daily  . enoxaparin (LOVENOX) injection  40 mg Subcutaneous QHS  . insulin aspart  0-24 Units Subcutaneous TID AC & HS  . lacosamide  150 mg Oral QHS  . lacosamide  50 mg Oral QPC breakfast  . metoprolol tartrate  37.5 mg Oral BID  . pantoprazole  40 mg Oral QAC breakfast  . potassium chloride  20 mEq Oral BID  . sodium chloride flush  3 mL Intravenous Q12H   Continuous Infusions: . sodium chloride     PRN Meds: sodium chloride, acetaminophen, ALPRAZolam, bisacodyl **OR** bisacodyl, metoprolol tartrate, ondansetron **OR** ondansetron (ZOFRAN) IV, oxyCODONE, sodium chloride flush, traMADol   Vital Signs    Vitals:   04/24/17 1200 04/24/17 1300 04/24/17 2000 04/25/17 0430  BP: 116/72 126/82 (!) 111/54 134/69  Pulse: (!) 105 (!) 102 (!) 104 (!) 114  Resp: 18 20 20 18   Temp:  98.3 F (36.8 C) 98 F (36.7 C) 97.7 F (36.5 C)  TempSrc:  Oral Oral Oral  SpO2: 95% 100% 97% 96%  Weight:    224 lb 1.6 oz (101.7 kg)  Height:        Intake/Output Summary (Last 24 hours) at 04/25/17 1136 Last data filed at 04/25/17 0830  Gross per 24 hour  Intake             1200 ml  Output             1500 ml  Net             -300 ml   Filed Weights   04/23/17 0315 04/24/17 0500 04/25/17 0430  Weight: 230 lb 12.8 oz (104.7 kg) 226 lb 1.6 oz (102.6 kg) 224 lb 1.6 oz (101.7 kg)    Telemetry    Sinus tachycardia - Personally Reviewed  Physical Exam  Alert, oriented GEN: No acute distress.   Neck: No JVD Cardiac: tachy and regular, no murmurs, rubs, or gallops.  Respiratory: Clear to auscultation bilaterally. GI: Soft, nontender, non-distended  MS: No  edema; No deformity. Neuro:  Nonfocal  Psych: Normal affect   Labs    Chemistry Recent Labs Lab 04/22/17 0306 04/22/17 1712 04/22/17 1715 04/23/17 0418 04/24/17 0552  NA 138 138  --  133* 139  K 4.4 3.9  --  3.8 4.4  CL 109 98*  --  101 107  CO2 24  --   --  26 25  GLUCOSE 106* 143*  --  151* 85  BUN 13 15  --  13 12  CREATININE 1.22* 1.50* 1.49* 1.34* 1.13*  CALCIUM 8.4*  --   --  8.5* 8.6*  GFRNONAA 46*  --  36* 41* 51*  GFRAA 54*  --  42* 48* 59*  ANIONGAP 5  --   --  6 7     Hematology Recent Labs Lab 04/22/17 0306 04/22/17 1712 04/22/17 1715 04/23/17 0418  WBC 10.2  --  9.2 8.7  RBC 3.58*  --  3.73* 3.54*  HGB 10.2* 11.6* 10.8* 10.0*  HCT 31.7* 34.0* 33.6* 31.8*  MCV 88.5  --  90.1 89.8  MCH 28.5  --  29.0  28.2  MCHC 32.2  --  32.1 31.4  RDW 13.7  --  14.2 14.1  PLT 225  --  237 218    Cardiac EnzymesNo results for input(s): TROPONINI in the last 168 hours. No results for input(s): TROPIPOC in the last 168 hours.   BNPNo results for input(s): BNP, PROBNP in the last 168 hours.   DDimer No results for input(s): DDIMER in the last 168 hours.   Radiology    No results found.   Patient Profile     61 y.o. female with NSTEMI, diabetes, severe multivessel disease at cath now s/p CABG 6/29  Assessment & Plan    1. NSTEMI: progressing well s/p CABG 2. Rhythm: stable, sinus rhythm. Metoprolol increased today. 3. Anemia, post-op, expected. Stable 4. Type II DM: glucose well-controlled.  Progressing well. Will arrange post-hospital cardiology FU with Dr Sallyanne Kuster or his PA/NP.   Deatra James, MD  04/25/2017, 11:36 AM

## 2017-04-25 NOTE — Progress Notes (Signed)
CARDIAC REHAB PHASE I   PRE:  Rate/Rhythm: 106 ST  BP:  Sitting: 136/77        SaO2: 100 RA  MODE:  Ambulation: 690 ft   POST:  Rate/Rhythm: 128 ST  BP:  Sitting: 153/96         SaO2: 100 RA  Pt ambulated 690 ft on RA, independent, steady gait, tolerated well with no complaints. Cardiac surgery discharge education completed. Reviewed risk factors, IS, sternal precautions, activity progression, exercise, heart healthy and diabetes diet handouts, sodium restrictions, s/s heart failure, daily weights and phase 2 cardiac rehab. Pt verbalized understanding, receptive to education. Pt agrees to phase 2 cardiac rehab referral, will send to [facility]. Pt to recliner after walk, call bell within reach.    [phone] Lenna Sciara, RN, BSN 04/25/2017 11:32 AM

## 2017-04-25 NOTE — Progress Notes (Addendum)
      [address]       [place] [address]             [phone]      4 Days Post-Op Procedure(s) (LRB): CORONARY ARTERY BYPASS GRAFTING (CABG) x 2, USING LEFT MAMMARY ARTERY AND RIGHT GREATER SAPHENOUS VEIN HARVESTED ENDOSCOPICALLY (N/A) TRANSESOPHAGEAL ECHOCARDIOGRAM (TEE) (N/A) Subjective: Feels good. Has been moving around and feeling better  Objective: Vital signs in last 24 hours: Temp:  [97.7 F (36.5 C)-98.6 F (37 C)] 97.7 F (36.5 C) (07/03 0430) Pulse Rate:  [102-117] 114 (07/03 0430) Cardiac Rhythm: Sinus tachycardia (07/03 0741) Resp:  [18-35] 18 (07/03 0430) BP: (111-134)/(54-82) 134/69 (07/03 0430) SpO2:  [93 %-100 %] 96 % (07/03 0430) Weight:  [101.7 kg (224 lb 1.6 oz)] 101.7 kg (224 lb 1.6 oz) (07/03 0430)     Intake/Output from previous day: 07/02 0701 - 07/03 0700 In: 1080 [P.O.:1080] Out: 1700 [Urine:1700] Intake/Output this shift: No intake/output data recorded.  General appearance: alert, cooperative and no distress Heart: sinus tachycardia Lungs: clear to auscultation bilaterally Abdomen: soft, non-tender; bowel sounds normal; no masses,  no organomegaly Extremities: extremities normal, atraumatic, no cyanosis or edema Wound: clean and dry  Lab Results:  Recent Labs  04/22/17 1715 04/23/17 0418  WBC 9.2 8.7  HGB 10.8* 10.0*  HCT 33.6* 31.8*  PLT 237 218   BMET:  Recent Labs  04/23/17 0418 04/24/17 0552  NA 133* 139  K 3.8 4.4  CL 101 107  CO2 26 25  GLUCOSE 151* 85  BUN 13 12  CREATININE 1.34* 1.13*  CALCIUM 8.5* 8.6*    PT/INR: No results for input(s): LABPROT, INR in the last 72 hours. ABG    Component Value Date/Time   PHART 7.319 (L) 04/21/2017 1825   HCO3 23.4 04/21/2017 1825   TCO2 25 04/22/2017 1712   ACIDBASEDEF 3.0 (H) 04/21/2017 1825   O2SAT 97.0 04/21/2017 1825   CBG (last 3)   Recent Labs  04/24/17 1754 04/24/17 2132 04/25/17 0621  GLUCAP 118* 122* 96    Assessment/Plan: S/P  Procedure(s) (LRB): CORONARY ARTERY BYPASS GRAFTING (CABG) x 2, USING LEFT MAMMARY ARTERY AND RIGHT GREATER SAPHENOUS VEIN HARVESTED ENDOSCOPICALLY (N/A) TRANSESOPHAGEAL ECHOCARDIOGRAM (TEE) (N/A)  CV-sinus tachycardia rate 100s-110s, BP stable. Increase Lopressor. Will remove pacing wires today Pulm-stable on room air, continue incentive spirometry Renal-creatinine trending down. Weight is trending down. Making good urine. Continue Lasix Endo-blood glucose well controlled. Preop A1C is 6.6 H and H stable On Lovenox for DVT proph Seizures-on [name], followed by Neurology outpatient  Plan: EPW out today. Likely home tomorrow. Encourage incentive spirometry.      LOS: 10 days    Sara [name] 04/25/2017   Chart reviewed, patient examined, agree with above. She feels well, ambulating well, bowels working. Plan home in am.

## 2017-04-25 NOTE — Discharge Summary (Signed)
[address]       [place] [address]             [phone]      Physician Discharge Summary  Patient ID: Sara [name] MRN: [id number] DOB/AGE: 24-Mar-1956 61 y.o.  Admit date: 04/15/2017 Discharge date: 04/26/2017  Admission Diagnoses: 1. ACS (acute coronary syndrome) ([place]) 2. NSTEMI (non-ST elevated myocardial infarction) ([place]) 3. Multivessel CAD  Active Diagnoses:  1. Hypertension 2. Hyperlipidemia 3. Essential thrombocythemia ([place]) 4. Type 2 diabetes mellitus ([place]) 5. OSA (obstructive sleep apnea) 6. Gout 7. Renal artery stenosis ([place]) 8. PAD (peripheral artery disease) ([place]) 9. Syncopal episodes 10. Thrombocytosis ([place]) 11. LVH (left ventricular hypertrophy) 12. ABL anemia      Discharged Condition: Stable and discharged to home.  HPI:  The patient is a 61 year old woman with diabetes, hyperlipidemia, hypertension, OSA on CPAP, RAS s/p PTCA and stenting on the right, PAD, and thrombocytosis who presents with recent onset of pressure-like chest discomfort that she thought was probably indigestion. It has come and gone several times but did not improve with antacids. There has been some radiation to the left arm and neck. She ruled in for MI with an initial troponin poc of 0.11, peaking at 21.64 yesterday afternoon and 15.84 this am. Cardiac cath yesterday afternoon showed 90% proximal LAD at the origin of a diagonal that has 80% ostial stenosis and 75% proximal stenosis. The LAD has 75% mid stenosis and 40% distal stenosis at the apex. Her LVEF by echo is 45% with hypokinesis of the anteroseptal wall and apex and was normal in 2014. She had brief chest discomfort this am.   Hospital Course:  On 04/21/2017 Ms. Sara [name] underwent a coronary bypass grafting 2 by Dr. Cyndia Bent. She tolerated the procedure well and was transferred to the ICU. She was extubated in a timely manner. Postop day 1 we were able to discontinue her chest tubes and  lines. We initiated a diuretic regimen for fluid overload. We started Levemir and sliding scale insulin for diabetes control. She was started on her [name] for her seizures the night of postop day 1. She did have a seizure that day in which she became unresponsive for a few minutes and had some confusion. She did recover fairly quickly. We continued her [name] at her home dose. Postop day 3 she continued to progress. We increased her Lopressor for better blood pressure and heart rate control. Her blood glucose level was fairly well-controlled on her current insulin regimen. We continued a diuretic regimen for fluid overload. And she was transferred to the stepdown unit for continued care. Postop day 4 she is doing quite well. She continues to mobilize around her room and around the unit without issue. She is on room air. We continued her Lasix at this time and she will require several days after discharge. We discontinued her epicardial pacing wires since her rhythm has been stable. We again increased her Lopressor for better heart rate control and she is currently on Lopressor 50 mg bid. She is ambulating  room air with limited assistance, her incisions are healing well, and she has had a bowel movement. She is felt surgically stable for discharge today.  Consults: None  Significant Diagnostic Studies:  CLINICAL DATA:  Postop from CABG.  EXAM: PORTABLE CHEST 1 VIEW  COMPARISON:  04/22/2017  FINDINGS: Left chest tube, mediastinal drain, and Swan-Ganz catheter have been removed since previous study. Right jugular Cordis  remains in place.  No evidence of pneumothorax. Mild subsegmental atelectasis again noted in left lung base. No evidence of pulmonary edema or consolidation. Prior CABG.  IMPRESSION: Mild left lower lung atelectasis. No evidence of pneumothorax or other acute findings.   Electronically Signed   By: Earle Gell M.D.   On: 04/23/2017 10:03  Treatments:    CARDIOVASCULAR SURGERY OPERATIVE NOTE  04/21/2017  Surgeon:  [name], MD  First Assistant: Nicholes Rough,  PA-C   Preoperative Diagnosis:  Severe single-vessel coronary artery disease   Postoperative Diagnosis:  Same   Procedure:  1. Median Sternotomy 2. Extracorporeal circulation 3.   Coronary artery bypass grafting x 2   Left internal mammary graft to the LAD  SVG to diagonal   4.   Endoscopic vein harvest from the right leg  Discharge Exam: Blood pressure 132/72, pulse (!) 107, temperature 97.9 F (36.6 C), temperature source Oral, resp. rate 20, height 5\' 2"  (1.575 m), weight 100.3 kg (221 lb 3.2 oz), SpO2 97 %.   Cardiovascular: Tachycardic Pulmonary: Clear to auscultation bilaterally Abdomen: Soft, non tender, bowel sounds present. Extremities: Mild bilateral lower extremity edema. Wounds: Clean and dry.  No erythema or signs of infection.  Disposition: 01-Home or Self Care  Discharge Instructions    Amb Referral to Cardiac Rehabilitation    Complete by:  As directed    Diagnosis:   CABG NSTEMI     CABG X ___:  2     Allergies as of 04/26/2017      Reactions   Sulfa Antibiotics Itching      Medication List    STOP taking these medications   telmisartan 20 MG tablet Commonly known as:  MICARDIS     TAKE these medications   acetaminophen 325 MG tablet Commonly known as:  TYLENOL Take 2 tablets (650 mg total) by mouth every 6 (six) hours as needed for mild pain.   anagrelide 1 MG capsule Commonly known as:  [name] take 1 capsule by mouth once daily   aspirin 325 MG EC tablet Take 1 tablet (325 mg total) by mouth daily. Start taking on:  04/27/2017 What changed:  medication strength  how much to take   atorvastatin 10 MG tablet Commonly known as:  LIPITOR Take 1 tablet (10 mg total) by mouth daily at 6 PM.   B COMPLEX PO Take 1 tablet by mouth daily.   furosemide 40 MG tablet Commonly known as:  LASIX Take 1  tablet (40 mg total) by mouth daily. For 5 days;then take only as needed for leg edema or weight gain greater than 3 pounds. What changed:  medication strength  how much to take  when to take this  reasons to take this  additional instructions   lacosamide 200 MG Tabs tablet Commonly known as:  [name] Take 0.5-1 tablets (100-200 mg total) by mouth 2 (two) times daily. 500 mg in the AM and 150 mg at bedtime What changed:  how much to take  additional instructions  Another medication with the same name was removed. Continue taking this medication, and follow the directions you see here.   metoprolol tartrate 50 MG tablet Commonly known as:  LOPRESSOR Take 1 tablet (50 mg total) by mouth 2 (two) times daily.   potassium chloride 10 MEQ tablet Commonly known as:  K-DUR,KLOR-CON Take 1 tablet (10 mEq total) by mouth daily. For 5 days then stop. Start taking on:  04/27/2017   simethicone 80 MG chewable  tablet Commonly known as:  MYLICON Chew 80 mg by mouth every 6 (six) hours as needed for flatulence.   traMADol 50 MG tablet Commonly known as:  ULTRAM Take 50 mg by mouth every 4-6 hours for severe pain.   Vitamin D3 2000 units capsule Take 2,000 Units by mouth daily.      Follow-up Information    [name], MD. Call today.   Specialty:  Internal Medicine Why:  Call for an appointment for further surveillance of HGA1C 6.6 and whether or not to start medication for diabetes. Contact information: [address] [place] [address] [phone]        [name], MD Follow up.   Specialty:  Cardiothoracic Surgery Why:  Your apopintment is on August 8th 2018 at 9:30am. Please arrive at [facility] for a chest xray at [facility] located on the first floor.  Contact information: [place] [address] [phone]        Nurse Follow up.   Why:  Your chest tube suture removal appointment is on 05/03/2017 at  10:00am.  Contact information: [address] [place] [address]       Barrett, Evelene Croon, PA-C Follow up.   Specialties:  Cardiology, Radiology Why:  Rosaria Ferries, PA-C 7/30 @9am  ([facility])  Contact information: [address] [address] [phone]          The patient has been discharged on:   1.Beta Blocker:  Yes [ x  ]                              No   [   ]                              If No, reason:  2.Ace Inhibitor/ARB: Yes [   ]                                     No  [  x  ]                                     If No, reason: Labile BP;hope tor restart after discharge  3.Statin:   Yes [ x  ]                  No  [   ]                  If No, reason:  4.Ecasa:  Yes  [ x  ]                  No   [   ]                  If No, reason:    Signed: ZIMMERMAN,DONIELLE M PA-C 04/26/2017, 11:15 AM

## 2017-04-26 LAB — GLUCOSE, CAPILLARY
GLUCOSE-CAPILLARY: 111 mg/dL — AB (ref 65–99)
GLUCOSE-CAPILLARY: 124 mg/dL — AB (ref 65–99)

## 2017-04-26 MED ORDER — POTASSIUM CHLORIDE CRYS ER 10 MEQ PO TBCR
10.0000 meq | EXTENDED_RELEASE_TABLET | Freq: Every day | ORAL | Status: DC
  Administered 2017-04-26: 10 meq via ORAL
  Filled 2017-04-26: qty 1

## 2017-04-26 MED ORDER — FUROSEMIDE 40 MG PO TABS: 40.0000 mg | ORAL_TABLET | Freq: Every day | ORAL | 0 refills | Status: DC

## 2017-04-26 MED ORDER — TRAMADOL HCL 50 MG PO TABS: ORAL_TABLET | ORAL | 0 refills | Status: DC

## 2017-04-26 MED ORDER — LACOSAMIDE 200 MG PO TABS: 100.0000 mg | ORAL_TABLET | Freq: Two times a day (BID) | ORAL | Status: DC

## 2017-04-26 MED ORDER — METOPROLOL TARTRATE 50 MG PO TABS
50.0000 mg | ORAL_TABLET | Freq: Two times a day (BID) | ORAL | Status: DC
  Administered 2017-04-26: 50 mg via ORAL
  Filled 2017-04-26: qty 1

## 2017-04-26 MED ORDER — IRBESARTAN 75 MG PO TABS: 37.5000 mg | ORAL_TABLET | Freq: Every day | ORAL | Status: DC

## 2017-04-26 MED ORDER — POTASSIUM CHLORIDE CRYS ER 10 MEQ PO TBCR: 10.0000 meq | EXTENDED_RELEASE_TABLET | Freq: Every day | ORAL | 0 refills | Status: DC

## 2017-04-26 MED ORDER — METOPROLOL TARTRATE 50 MG PO TABS: 50.0000 mg | ORAL_TABLET | Freq: Two times a day (BID) | ORAL | 1 refills | Status: DC

## 2017-04-26 MED ORDER — ASPIRIN 325 MG PO TBEC: 325.0000 mg | DELAYED_RELEASE_TABLET | Freq: Every day | ORAL | 0 refills | Status: DC

## 2017-04-26 MED ORDER — METOPROLOL TARTRATE 50 MG PO TABS: 50.0000 mg | ORAL_TABLET | Freq: Two times a day (BID) | ORAL | Status: DC

## 2017-04-26 MED ORDER — FUROSEMIDE 40 MG PO TABS
40.0000 mg | ORAL_TABLET | Freq: Every day | ORAL | Status: DC
  Administered 2017-04-26: 40 mg via ORAL
  Filled 2017-04-26: qty 1

## 2017-04-26 MED ORDER — ACETAMINOPHEN 325 MG PO TABS: 650.0000 mg | ORAL_TABLET | Freq: Four times a day (QID) | ORAL | Status: DC | PRN

## 2017-04-26 NOTE — Care Management Note (Signed)
Case Management Note Previous CM note initiated by Bethena Roys, RN--04/17/2017, 10:35 AM   Patient Details  Name: Sara [name] MRN: [id number] Date of Birth: [birth year]  Subjective/Objective:   Pt presented for Chest Pain-found to have Nstemi. Post cardiac cath-  cardiac surgery to consult for CABG. Pt is from home with husband and has support of daughter Holly Bodily that lives in [place]. PTA pt was working and independent. Pt works for the school system.                 Action/Plan: PT/OT consult once stable post CABG for disposition recommendations. CM will continue to monitor for additional home needs post procedure.   Expected Discharge Date:  04/26/17               Expected Discharge Plan:  [facility]  In-House Referral:  NA  Discharge planning Services  CM Consult  Post Acute Care Choice:  NA Choice offered to:  NA  DME Arranged:    DME Agency:  NA  HH Arranged:  NA HH Agency:  NA  Status of Service:  Completed, signed off  If discussed at [facility] of Stay Meetings, dates discussed:  7/3  Discharge Disposition: home/self care   Additional Comments:  04/26/17- 1230- Virginio Isidore RN, CM- pt for d/c home today- s/p CABG- no CM need noted for discharge  Dawayne Patricia, RN 04/26/2017, 12:39 PM [phone]

## 2017-04-26 NOTE — Assessment & Plan Note (Signed)
   Left internal mammary graft to the LAD  SVG to diagonal

## 2017-04-26 NOTE — Progress Notes (Addendum)
      [address]       [place] [address]             [phone]        5 Days Post-Op Procedure(s) (LRB): CORONARY ARTERY BYPASS GRAFTING (CABG) x 2, USING LEFT MAMMARY ARTERY AND RIGHT GREATER SAPHENOUS VEIN HARVESTED ENDOSCOPICALLY (N/A) TRANSESOPHAGEAL ECHOCARDIOGRAM (TEE) (N/A)  Subjective: Patient without complaints. She hopes to go home.  Objective: Vital signs in last 24 hours: Temp:  [97.9 F (36.6 C)-98.4 F (36.9 C)] 97.9 F (36.6 C) (07/04 0616) Pulse Rate:  [93-108] 107 (07/04 0616) Cardiac Rhythm: Sinus tachycardia (07/04 0700) Resp:  [17-20] 20 (07/04 0616) BP: (117-132)/(60-72) 132/72 (07/04 0616) SpO2:  [96 %-98 %] 97 % (07/04 0616) Weight:  [100.3 kg (221 lb 3.2 oz)] 100.3 kg (221 lb 3.2 oz) (07/04 0616)  Pre op weight 99.3 kg Current Weight  04/26/17 100.3 kg (221 lb 3.2 oz)       Intake/Output from previous day: 07/03 0701 - 07/04 0700 In: 1560 [P.O.:1560] Out: -    Physical Exam:  Cardiovascular: Tachycardic Pulmonary: Clear to auscultation bilaterally Abdomen: Soft, non tender, bowel sounds present. Extremities: Mild bilateral lower extremity edema. Wounds: Clean and dry.  No erythema or signs of infection.  Lab Results: CBC:No results for input(s): WBC, HGB, HCT, PLT in the last 72 hours. BMET:  Recent Labs  04/24/17 0552  NA 139  K 4.4  CL 107  CO2 25  GLUCOSE 85  BUN 12  CREATININE 1.13*  CALCIUM 8.6*    PT/INR:  Lab Results  Component Value Date   INR 1.29 04/21/2017   INR 1.03 04/17/2017   INR 1.09 04/15/2017   ABG:  INR: Will add last result for INR, ABG once components are confirmed Will add last 4 CBG results once components are confirmed  Assessment/Plan:  1. CV - S/p NSTEMI. Has had intermittent ST with HR in the mid100's.. On Lopressor 37.5 mg bid but will increase to 50 mg bid for better HR control. 2.  Pulmonary - On room air. Encourage incentive spirometer. 3. History of seizure  disorder-continue Lacosamide. 4.  Acute blood loss anemia - Last H and H stable at 10.8 and 33.6 5. DM-CBGs 95/123/1110Pre op HGA1C 6.6. Per Dr. Cyndia Bent, will need to follow up with medical doctor after discharge.  6. Chest tube sutures to remain-will be removed in the office after discharge. 7. Mild volume overload-will give several more days of Lasix then stop. 8. Will discharge later today.  Kinzley Savell MPA-C 04/26/2017,8:30 AM

## 2017-04-26 NOTE — Progress Notes (Signed)
Discussed with the patient and all questioned fully answered. She will call me if any problems arise.  IV removed. Telemetry removed. Sutures intact per order - pt verbalized understanding of follow up suture removal.   Fritz Pickerel, RN

## 2017-04-28 ENCOUNTER — Telehealth (HOSPITAL_COMMUNITY): Payer: Self-pay

## 2017-04-28 NOTE — Telephone Encounter (Signed)
Patient insurance is active and benefits verified. Patient has BCBS - no co-payment, deductible $1250/$1250 has been met, out of pocket $4350/$2648.57 has been met, no co-insurance, no pre-authorization and no limit on visit. Passport/reference [phone].  Patient will be contacted and scheduled after their follow up appointment with the cardiologist office on 05/22/17 and surgeon on 05/817, upon review by [facility] Rn navigator.

## 2017-05-03 ENCOUNTER — Ambulatory Visit (INDEPENDENT_AMBULATORY_CARE_PROVIDER_SITE_OTHER): Payer: Self-pay

## 2017-05-03 ENCOUNTER — Encounter (HOSPITAL_COMMUNITY): Payer: Self-pay

## 2017-05-03 DIAGNOSIS — Z4802 Encounter for removal of sutures: Secondary | ICD-10-CM

## 2017-05-03 NOTE — Progress Notes (Signed)
Removed 3 sutures from chest tube site with no signs of infection. Patient tolerated well.

## 2017-05-13 ENCOUNTER — Other Ambulatory Visit: Payer: Self-pay | Admitting: Physician Assistant

## 2017-05-22 ENCOUNTER — Encounter: Payer: Self-pay | Admitting: Physician Assistant

## 2017-05-22 ENCOUNTER — Ambulatory Visit (INDEPENDENT_AMBULATORY_CARE_PROVIDER_SITE_OTHER): Payer: BC Managed Care – PPO | Admitting: Physician Assistant

## 2017-05-22 ENCOUNTER — Other Ambulatory Visit: Payer: Self-pay | Admitting: Hematology

## 2017-05-22 VITALS — BP 160/76 | HR 79 | Ht 62.0 in | Wt 213.0 lb

## 2017-05-22 DIAGNOSIS — I214 Non-ST elevation (NSTEMI) myocardial infarction: Secondary | ICD-10-CM

## 2017-05-22 DIAGNOSIS — I10 Essential (primary) hypertension: Secondary | ICD-10-CM | POA: Diagnosis not present

## 2017-05-22 DIAGNOSIS — E785 Hyperlipidemia, unspecified: Secondary | ICD-10-CM | POA: Diagnosis not present

## 2017-05-22 DIAGNOSIS — E1169 Type 2 diabetes mellitus with other specified complication: Secondary | ICD-10-CM | POA: Diagnosis not present

## 2017-05-22 MED ORDER — ATORVASTATIN CALCIUM 40 MG PO TABS: 40.0000 mg | ORAL_TABLET | Freq: Every day | ORAL | 11 refills | Status: DC

## 2017-05-22 MED ORDER — METOPROLOL TARTRATE 75 MG PO TABS: 75.0000 mg | ORAL_TABLET | Freq: Two times a day (BID) | ORAL | 11 refills | Status: DC

## 2017-05-22 MED ORDER — TELMISARTAN 20 MG PO TABS: 20.0000 mg | ORAL_TABLET | Freq: Every day | ORAL | 11 refills | Status: DC

## 2017-05-22 NOTE — Progress Notes (Signed)
Thanks MCr 

## 2017-05-22 NOTE — Progress Notes (Addendum)
Cardiology Office Note   Date:  05/22/2017   ID:  Sara [name], DOB 09-18-1956, MRN [id number]  PCP:  [name], MD  Cardiologist:  Dr Sallyanne Kuster TAVR: Dr Nolene Bernheim, PA-C   Chief Complaint  Patient presents with  . Follow-up    s/p CABG    History of Present Illness: Sara [name] is a 61 y.o. female with a history of HTN, HLD, diabetes, OSA, PAD, R-RAS s/p stent, Sz, gout, Thrombocytosis  Admit  NSTEMI 06/23-07/01/2017, severe multivessel disease at cath s/p CABG 6/29 w/ LIMA-LAD, SVG-Diag, EF 45-50%  Sara [name] presents for cardiology follow up.  She is walking every day. She is not having chest pain or SOB with exertion. She does get some chest discomfort when she lies down. It is in the area of her incision, and there is also some discomfort to the right at times. She is not having LE edema. She is not having problems going up the stairs.   She cares for her son, who is a paranoid schizophrenic.   She was on telmisartan 20 mg bid prior to admission. This was held for the cath, not restarted. The metoprolol is new. She is aware that her blood pressures running higher than normal, wonders if we should restart the telmisartan.   Past Medical History:  Diagnosis Date  . Diabetes mellitus   . Gout   . Hyperlipemia   . Hypertension   . LVH (left ventricular hypertrophy)    12/21/09 Echo -mild asymmetric LVH,EF =>55%  . Morbid obesity ([place])   . OSA (obstructive sleep apnea)   . PAD (peripheral artery disease) ([place])   . Renal artery stenosis ([place]) 12/13/2010   PTCA and Stent - right  . Seizures ([place])   . Syncopal episodes   . Thrombocytosis ([place])     Past Surgical History:  Procedure Laterality Date  . BREAST CYST EXCISION     S/P Benign Right Breast Cyst Removal.  . BREAST REDUCTION SURGERY  1995   S/P Bilateral breast reduction  . [place]   Times  Two.  . CORONARY ARTERY BYPASS GRAFT N/A 04/21/2017   Procedure: CORONARY ARTERY BYPASS GRAFTING (CABG) x 2, USING LEFT MAMMARY ARTERY AND RIGHT GREATER SAPHENOUS VEIN HARVESTED ENDOSCOPICALLY;  Surgeon: [name], MD;  Location: [facility];  Service: Open Heart Surgery;  Laterality: N/A;  . EXTREMITY CYST EXCISION  1993   left wrist  . LEFT HEART CATH AND CORONARY ANGIOGRAPHY N/A 04/17/2017   Procedure: Left Heart Cath and Coronary Angiography;  Surgeon: Jettie Booze, MD;  Location: [facility];  Service: Cardiovascular;  Laterality: N/A;  . PARTIAL HYSTERECTOMY  1994  . renal artery stent placement Right   . TEE WITHOUT CARDIOVERSION N/A 04/21/2017   Procedure: TRANSESOPHAGEAL ECHOCARDIOGRAM (TEE);  Surgeon: [name], MD;  Location: [facility];  Service: Open Heart Surgery;  Laterality: N/A;    Current Outpatient Prescriptions  Medication Sig Dispense Refill  . acetaminophen (TYLENOL) 325 MG tablet Take 2 tablets (650 mg total) by mouth every 6 (six) hours as needed for mild pain.    Marland Kitchen anagrelide ([name]) 1 MG capsule take 1 capsule by mouth once daily 30 capsule 0  . aspirin EC 325 MG EC tablet Take 1 tablet (325 mg total) by mouth daily. 30 tablet 0  . atorvastatin (LIPITOR) 10 MG tablet Take 1 tablet (10 mg total) by mouth daily at 6 PM. 90  tablet 3  . B Complex Vitamins (B COMPLEX PO) Take 1 tablet by mouth daily.     . Cholecalciferol (VITAMIN D3) 2000 UNITS capsule Take 2,000 Units by mouth daily.     Marland Kitchen lacosamide ([name]) 200 MG TABS tablet Take 0.5-1 tablets (100-200 mg total) by mouth 2 (two) times daily. 500 mg in the AM and 150 mg at bedtime 60 tablet   . metoprolol tartrate (LOPRESSOR) 50 MG tablet Take 1 tablet (50 mg total) by mouth 2 (two) times daily. 60 tablet 1  . traMADol (ULTRAM) 50 MG tablet Take 50 mg by mouth every 4-6 hours for severe pain. 30 tablet 0   No current facility-administered medications for this visit.     Allergies:   Sulfa antibiotics    Social History:  The patient  reports that she  has never smoked. She has never used smokeless tobacco. She reports that she does not drink alcohol or use drugs.   Family History:  The patient's family history includes Breast cancer in her maternal aunt and maternal grandmother; Diabetes in her father and sister; Heart failure in her mother; Hypertension in her sister, sister, and sister.    ROS:  Please see the history of present illness. All other systems are reviewed and negative.    PHYSICAL EXAM: VS:  BP (!) 160/76   Pulse 79   Ht 5\' 2"  (1.575 m)   Wt 213 lb (96.6 kg)   BMI 38.96 kg/m  , BMI Body mass index is 38.96 kg/m. GEN: Well nourished, well developed, female in no acute distress  HEENT: normal for age  Neck: no JVD, no carotid bruit, no masses Cardiac: RRR; soft murmur, no rubs, or gallops Respiratory:  clear to auscultation bilaterally, normal work of breathing. Pt tends not to take deep breaths. GI: soft, nontender, nondistended, + BS MS: no deformity or atrophy; no edema; distal pulses are 2+ in all 4 extremities   Skin: warm and dry, no rash Neuro:  Strength and sensation are intact Psych: euthymic mood, full affect   EKG:  EKG is ordered today. The ekg ordered today demonstrates SR, HR 79 w/ anterior T wave changes are new from 04/22/2017  ECHO: TEE 04/21/2017 Result status: Final result   Left ventricle: LV systolic function is mildly reduced with an EF of 45-50%.  Septum: No Patent Foramen Ovale present.  Left atrium: Patent foramen ovale not present.  Aortic valve: The valve is trileaflet. No regurgitation.  Mitral valve: Trace regurgitation.      Recent Labs: 04/16/2017: ALT 25 04/22/2017: Magnesium 2.3 04/23/2017: Hemoglobin 10.0; Platelets 218 04/24/2017: BUN 12; Creatinine, Ser 1.13; Potassium 4.4; Sodium 139    Lipid Panel    Component Value Date/Time   CHOL 102 04/17/2017 1000   TRIG 40 04/17/2017 1000   HDL 38 (L) 04/17/2017 1000   CHOLHDL 2.7 04/17/2017 1000   VLDL 8 04/17/2017 1000    LDLCALC 56 04/17/2017 1000     Wt Readings from Last 3 Encounters:  05/22/17 213 lb (96.6 kg)  04/26/17 221 lb 3.2 oz (100.3 kg)  03/01/17 228 lb 8 oz (103.6 kg)     Other studies Reviewed: Additional studies/ records that were reviewed today include: hospital records and testing.  ASSESSMENT AND PLAN:  1.  Non-STEMI: She is on full strength aspirin because of bypass surgery, a beta blocker, and Lipitor. We will increase her Lipitor to 40 mg daily because of her MI. We will add an ARB.  2.  Hypertension: Her blood pressure is elevated and has been consistently. She was on telmisartan prior to admission and tolerated this well. Her creatinine prior to surgery was 1.15. The telmisartan was held. After the surgery, her creatinine went to 1.5. He was 1.131 on 04/24/2017. We'll restart the telmisartan at 20 mg daily instead of 20 mg twice a day. We will check a BMET in 2 weeks. We will increase the metoprolol to 75 mg twice a day.  3. Dyslipidemia: She has been on low-dose Lipitor and her LDL is well controlled. However, her HDL was below target. We will increase his statin to post MI dosing and follow her profile in 3 months.  Current medicines are reviewed at length with the patient today.  The patient does not have concerns regarding medicines.  The following changes have been made:  Increase metoprolol, restart telmisartan, increase Lipitor  Labs/ tests ordered today include:   Orders Placed This Encounter  Procedures  . Basic metabolic panel  . Lipid panel  . Comprehensive metabolic panel  . AMB referral to cardiac rehabilitation  . EKG 12-Lead     Disposition:   FU with Dr Sallyanne Kuster   Signed, Rosaria Ferries, PA-C  05/22/2017 2:21 PM    [place] Phone: [phone]; Fax: [phone]  This note was written with the assistance of speech recognition software. Please excuse any transcriptional errors.

## 2017-05-22 NOTE — Patient Instructions (Signed)
Medication Instructions:  INCREASE LIPITOR TO 40MG  INCREASE METOPROLOL TO 75MG   RESTART TELMISARTAN 20MG   If you need a refill on your cardiac medications before your next appointment, please call your pharmacy.  Labwork: BMET IN 2 WEEKS ` 06-05-17 HERE IN OUR OFFICE AT [facility] CMET AND LIPID THE WEEK BEFORE DR Sallyanne Kuster APPT   Follow-Up: Your physician wants you to follow-up in: [place].  Special Instructions: REFERRAL TO CARDIAC REHAB-[place]   Thank you for choosing [facility] at [facility]!!

## 2017-05-24 ENCOUNTER — Other Ambulatory Visit: Payer: Self-pay | Admitting: *Deleted

## 2017-05-24 DIAGNOSIS — D473 Essential (hemorrhagic) thrombocythemia: Secondary | ICD-10-CM

## 2017-05-24 MED ORDER — ANAGRELIDE HCL 1 MG PO CAPS: 1.0000 mg | ORAL_CAPSULE | Freq: Every day | ORAL | 0 refills | Status: DC

## 2017-05-26 ENCOUNTER — Other Ambulatory Visit: Payer: Self-pay | Admitting: Surgery

## 2017-05-26 DIAGNOSIS — Z951 Presence of aortocoronary bypass graft: Secondary | ICD-10-CM

## 2017-05-29 ENCOUNTER — Ambulatory Visit (INDEPENDENT_AMBULATORY_CARE_PROVIDER_SITE_OTHER): Payer: Self-pay | Admitting: Physician Assistant

## 2017-05-29 ENCOUNTER — Ambulatory Visit
Admission: RE | Admit: 2017-05-29 | Discharge: 2017-05-29 | Disposition: A | Discharge status: Home / Self Care | Payer: BC Managed Care – PPO | Source: Ambulatory Visit | Attending: Surgery | Admitting: Surgery

## 2017-05-29 VITALS — BP 163/78 | HR 76 | Resp 16 | Ht 62.0 in | Wt 213.2 lb

## 2017-05-29 DIAGNOSIS — I2510 Atherosclerotic heart disease of native coronary artery without angina pectoris: Secondary | ICD-10-CM

## 2017-05-29 DIAGNOSIS — Z951 Presence of aortocoronary bypass graft: Secondary | ICD-10-CM

## 2017-05-29 NOTE — Patient Instructions (Addendum)
You may return to driving an automobile as long as you are no longer requiring oral narcotic pain relievers during the daytime.  It would be wise to start driving only short distances during the daylight and gradually increase from there as you feel comfortable.  Make every effort to keep your diabetes under very tight control.  Follow up closely with your primary care physician or endocrinologist and strive to keep their hemoglobin A1c levels as low as possible, preferably near or below 6.0.  The long term benefits of strict control of diabetes are far reaching and critically important for your overall health and survival.  Make every effort to stay physically active, get some type of exercise on a regular basis, and stick to a "heart healthy diet".  The long term benefits for regular exercise and a healthy diet are critically important to your overall health and wellbeing.  Follow-up with cardiology in 1 week.

## 2017-05-29 NOTE — Progress Notes (Signed)
Sara [name] is a 61 y.o. female patient s/p CABG x 2 on June 29th.  1. CAD, multiple vessel   2. S/P CABG x 2    Past Medical History:  Diagnosis Date  . Diabetes mellitus   . Gout   . Hyperlipemia   . Hypertension   . LVH (left ventricular hypertrophy)    12/21/09 Echo -mild asymmetric LVH,EF =>55%  . Morbid obesity ([place])   . OSA (obstructive sleep apnea)   . PAD (peripheral artery disease) ([place])   . Renal artery stenosis ([place]) 12/13/2010   PTCA and Stent - right  . Seizures ([place])   . Syncopal episodes   . Thrombocytosis ([facility])    No past surgical history pertinent negatives on file. Scheduled Meds: Current Outpatient Prescriptions on File Prior to Visit  Medication Sig Dispense Refill  . acetaminophen (TYLENOL) 325 MG tablet Take 2 tablets (650 mg total) by mouth every 6 (six) hours as needed for mild pain.    Marland Kitchen anagrelide ([name]) 1 MG capsule Take 1 capsule (1 mg total) by mouth daily. 30 capsule 0  . aspirin EC 325 MG EC tablet Take 1 tablet (325 mg total) by mouth daily. 30 tablet 0  . atorvastatin (LIPITOR) 40 MG tablet Take 1 tablet (40 mg total) by mouth daily at 6 PM. 30 tablet 11  . B Complex Vitamins (B COMPLEX PO) Take 1 tablet by mouth daily.     . Cholecalciferol (VITAMIN D3) 2000 UNITS capsule Take 2,000 Units by mouth daily.     Marland Kitchen lacosamide ([name]) 200 MG TABS tablet Take 0.5-1 tablets (100-200 mg total) by mouth 2 (two) times daily. 500 mg in the AM and 150 mg at bedtime 60 tablet   . metoprolol tartrate 75 MG TABS Take 75 mg by mouth 2 (two) times daily. 30 tablet 11  . telmisartan (MICARDIS) 20 MG tablet Take 1 tablet (20 mg total) by mouth daily. 30 tablet 11  . traMADol (ULTRAM) 50 MG tablet Take 50 mg by mouth every 4-6 hours for severe pain. (Patient not taking: Reported on 05/29/2017) 30 tablet 0   No current facility-administered medications on file prior to visit.     Allergies  Allergen Reactions  . Sulfa Antibiotics Itching   Active  Problems:   * No active hospital problems. *  Blood pressure (!) 163/78, pulse 76, resp. rate 16, height 5\' 2"  (1.575 m), weight 96.7 kg (213 lb 3.2 oz), SpO2 97 %.  Subjective : Overall Sara [name] feels quite well. She has had a limited amount of incisional pain. She does still take 2 Tylenol at night. She is no longer taking narcotic pain medication. She recently saw the cardiologist last week and they increased her hypertensive medication.  Objective  Cor: RRR, no murmur Pulm: CTA bilaterally.  Abd: soft, non-tender Wound: sternal incision is c/d/i, EVH sites are c/d/i Ext: no edema   CLINICAL DATA:  61 year old female post CABG June 2018. No complaints. Nonsmoker. Diabetes and hypertension. Subsequent encounter.  EXAM: CHEST  2 VIEW  COMPARISON:  04/23/2017, 04/15/2017 in 12/2013 2015 chest x-ray.  FINDINGS: Post CABG.  No infiltrate, congestive heart failure or pneumothorax.  No plain film evidence of pulmonary malignancy.  No acute osseous abnormality  IMPRESSION: Post CABG.  No acute abnormality.   Electronically Signed   By: Genia Del M.D.   On: 05/29/2017 13:30  Assessment & Plan  Sara [name] is status post coronary bypass grafting 2 by Dr. Cyndia Bent on  04/21/2017. She is progressing quite well. She has had no issues getting around. She has not had no shortness of breath. She works with the [employer] and is asking when she can return to work. I explained that we wait at least 8 weeks to return to a job that requires no heavy lifting. She does have a very active work today that involves stress which is not something that she needs to be getting back into right away. I suggested early September for returning to work. She has arranged for other individuals to help until then and has saved enough sick time to cover her recovery time. Her blood pressure was slightly elevated on exam today. She has been working with her cardiologist to titrate her  medications. She has a follow-up appointment next week. I will keep her medications the same this visit. Now that her incisions are well-healed I did clear her for using scar topical medication such as mederma. Since she is no longer taking narcotic pain medicine she is able to drive a motor vehicle. I provided her with a referral to cardiac rehabilitation today which she plans to attend here [facility]. She plans to lose another 15-20 pounds and believes that having an exercise program 3 times a week will assist her with that. I reviewed her chest x-ray with her today which showed no acute abnormalities. Today, I discharged her from the practice and she may follow-up as needed. She is always welcome to call our office if questions or concerns arise. She is to follow up with her cardiologist next week and her primary care provider when able.  Sara [name] 05/29/2017

## 2017-05-31 ENCOUNTER — Ambulatory Visit: Payer: Self-pay | Admitting: Surgery

## 2017-06-02 NOTE — Addendum Note (Signed)
Addended by: Elnita Maxwell R on: 06/02/2017 10:10 AM   Modules accepted: Orders

## 2017-06-05 LAB — COMPREHENSIVE METABOLIC PANEL
ALK PHOS: 80 IU/L (ref 39–117)
ALT: 15 IU/L (ref 0–32)
AST: 15 IU/L (ref 0–40)
Albumin/Globulin Ratio: 1.6 (ref 1.2–2.2)
Albumin: 4 g/dL (ref 3.6–4.8)
BILIRUBIN TOTAL: 0.2 mg/dL (ref 0.0–1.2)
BUN / CREAT RATIO: 13 (ref 12–28)
BUN: 15 mg/dL (ref 8–27)
CO2: 23 mmol/L (ref 20–29)
CREATININE: 1.18 mg/dL — AB (ref 0.57–1.00)
Calcium: 9.4 mg/dL (ref 8.7–10.3)
Chloride: 107 mmol/L — ABNORMAL HIGH (ref 96–106)
GFR calc Af Amer: 57 mL/min/{1.73_m2} — ABNORMAL LOW (ref >59)
GFR calc non Af Amer: 50 mL/min/{1.73_m2} — ABNORMAL LOW (ref >59)
GLOBULIN, TOTAL: 2.5 g/dL (ref 1.5–4.5)
GLUCOSE: 131 mg/dL — AB (ref 65–99)
Potassium: 4.8 mmol/L (ref 3.5–5.2)
SODIUM: 144 mmol/L (ref 134–144)
TOTAL PROTEIN: 6.5 g/dL (ref 6.0–8.5)

## 2017-06-05 LAB — LIPID PANEL
CHOL/HDL RATIO: 2.3 ratio (ref 0.0–4.4)
Cholesterol, Total: 86 mg/dL — ABNORMAL LOW (ref 100–199)
HDL: 37 mg/dL — ABNORMAL LOW (ref >39)
LDL Calculated: 39 mg/dL (ref 0–99)
TRIGLYCERIDES: 48 mg/dL (ref 0–149)
VLDL Cholesterol Cal: 10 mg/dL (ref 5–40)

## 2017-06-07 NOTE — Progress Notes (Signed)
[place] OFFICE PROGRESS NOTE   [name], [address] [place] [address]  DIAGNOSIS: Essential thrombocythemia [facility]) - Plan: anagrelide ([name]) 1 MG capsule  CC: follow up Essential Thrombocytosis  CURRENT THERAPY: Hydroxyurea 500 mg daily, started in 2010., increased to hydrea 1000mg  daily on Monday and Thursday, and 500mg  daily for the rest of week in 01/2015,  Aspirin 81 mg daily.  Switched Hydrea to anagrelide 1 mg daily 07/2016  INTERVAL HISTORY:   Sara [name] 61 y.o. female with history of JAK positive Essential thrombocythemia presents for follow up. She had a bypass in June 2018 after having a heart attack. She initially thought the chest pain was indigestion or gas versus cardiac issue. She is seeing a nutritionist at church. She has intentionally lost about 17 lbs. She regularly monitors her sugar level and has been trying to reduce sugar in her diet to maintain diabetes. She takes a baby aspirin daily. She denies any issues or concerns with current medication. She plans to return to work soon and feels she is strong enough.    MEDICAL HISTORY: Past Medical History:  Diagnosis Date  . Diabetes mellitus   . Gout   . Hyperlipemia   . Hypertension   . LVH (left ventricular hypertrophy)    12/21/09 Echo -mild asymmetric LVH,EF =>55%  . Morbid obesity ([place])   . OSA (obstructive sleep apnea)   . PAD (peripheral artery disease) ([place])   . Renal artery stenosis ([place]) 12/13/2010   PTCA and Stent - right  . Seizures ([place])   . Syncopal episodes   . Thrombocytosis (H[facility])      ALLERGIES:  is allergic to sulfa antibiotics.  MEDICATIONS:  Allergies as of 06/12/2017      Reactions   Sulfa Antibiotics Itching      Medication List       Accurate as of 06/12/17 11:42 PM. Always use your most recent med list.          acetaminophen 325 MG tablet Commonly known as:  TYLENOL Take 2 tablets (650 mg total) by mouth every 6  (six) hours as needed for mild pain.   anagrelide 1 MG capsule Commonly known as:  [name] Take 1 capsule (1 mg total) by mouth daily.   aspirin 325 MG EC tablet Take 1 tablet (325 mg total) by mouth daily.   atorvastatin 40 MG tablet Commonly known as:  LIPITOR Take 1 tablet (40 mg total) by mouth daily at 6 PM.   B COMPLEX PO Take 1 tablet by mouth daily.   lacosamide 200 MG Tabs tablet Commonly known as:  [name] Take 0.5-1 tablets (100-200 mg total) by mouth 2 (two) times daily. 500 mg in the AM and 150 mg at bedtime   Metoprolol Tartrate 75 MG Tabs Take 75 mg by mouth 2 (two) times daily.   telmisartan 20 MG tablet Commonly known as:  MICARDIS Take 1 tablet (20 mg total) by mouth daily.   traMADol 50 MG tablet Commonly known as:  ULTRAM Take 50 mg by mouth every 4-6 hours for severe pain.   Vitamin D3 2000 units capsule Take 2,000 Units by mouth daily.       SURGICAL HISTORY:  Past Surgical History:  Procedure Laterality Date  . BREAST CYST EXCISION     S/P Benign Right Breast Cyst Removal.  . BREAST REDUCTION SURGERY  1995   S/P Bilateral breast reduction  . [place]  [facility].  . CORONARY ARTERY BYPASS GRAFT N/A 04/21/2017   Procedure: CORONARY ARTERY BYPASS GRAFTING (CABG) x 2, USING LEFT MAMMARY ARTERY AND RIGHT GREATER SAPHENOUS VEIN HARVESTED ENDOSCOPICALLY;  Surgeon: [name], MD;  Location: [facility];  Service: Open Heart Surgery;  Laterality: N/A;  . EXTREMITY CYST EXCISION  1993   left wrist  . LEFT HEART CATH AND CORONARY ANGIOGRAPHY N/A 04/17/2017   Procedure: Left Heart Cath and Coronary Angiography;  Surgeon: Jettie Booze, MD;  Location: [facility];  Service: Cardiovascular;  Laterality: N/A;  . PARTIAL HYSTERECTOMY  1994  . renal artery stent placement Right   . TEE WITHOUT CARDIOVERSION N/A 04/21/2017   Procedure: TRANSESOPHAGEAL ECHOCARDIOGRAM (TEE);  Surgeon: [name], MD;  Location: [facility];   Service: Open Heart Surgery;  Laterality: N/A;    REVIEW OF SYSTEMS:  Constitutional: Denies fevers, chills or abnormal weight loss Eyes: Denies blurriness of vision Ears, nose, mouth, throat, and face: Denies mucositis or sore throat Respiratory: Denies cough, dyspnea or wheezes Cardiovascular: Denies palpitation, chest discomfort or lower extremity swelling Gastrointestinal:  Denies nausea, heartburn. Skin: Denies abnormal skin rashes Lymphatics: Denies new lymphadenopathy or easy bruising Neurological:Denies numbness, tingling or new weaknesses. (+) Intermittent syncope Behavioral/Psych: Mood is stable, no new changes  All other systems were reviewed with the patient and are negative.  PHYSICAL EXAMINATION: ECOG PERFORMANCE STATUS: 0  Blood pressure (!) 158/63, pulse 68, temperature 98.3 F (36.8 C), temperature source Oral, resp. rate 17, height 5\' 2"  (1.575 m), weight 208 lb 6.4 oz (94.5 kg), SpO2 97 %.  GENERAL:alert, no distress and comfortable; obese pleasant middle-age woman SKIN: skin color, texture, turgor are normal, no rashes or significant lesions EYES: normal, Conjunctiva are pink and non-injected, sclera clear OROPHARYNX:no exudate, no erythema and lips, buccal mucosa, and tongue normal  NECK: supple, thyroid normal size, non-tender, without nodularity LYMPH:  no palpable lymphadenopathy in the cervical, axillary or inguinal LUNGS: clear to auscultation and percussion with normal breathing effort HEART: regular rate & rhythm and no murmurs and no lower extremity edema ABDOMEN:abdomen soft, non-tender,normal bowel sounds Musculoskeletal:no cyanosis of digits and no clubbing  NEURO: alert & oriented x 3 with fluent speech, no focal motor/sensory deficits   LABORATORY DATA:  CBC Latest Ref Rng & Units 06/12/2017 04/23/2017 04/22/2017  WBC 3.9 - 10.3 10e3/uL 5.9 8.7 9.2  Hemoglobin 11.6 - 15.9 g/dL 13.2 10.0(L) 10.8(L)  Hematocrit 34.8 - 46.6 % 40.2 31.8(L) 33.6(L)   Platelets 145 - 400 10e3/uL 351 218 237    CMP Latest Ref Rng & Units 06/05/2017 04/24/2017 04/23/2017  Glucose 65 - 99 mg/dL 131(H) 85 151(H)  BUN 8 - 27 mg/dL 15 12 13   Creatinine 0.57 - 1.00 mg/dL 1.18(H) 1.13(H) 1.34(H)  Sodium 134 - 144 mmol/L 144 139 133(L)  Potassium 3.5 - 5.2 mmol/L 4.8 4.4 3.8  Chloride 96 - 106 mmol/L 107(H) 107 101  CO2 20 - 29 mmol/L 23 25 26   Calcium 8.7 - 10.3 mg/dL 9.4 8.6(L) 8.5(L)  Total Protein 6.0 - 8.5 g/dL 6.5 - -  Total Bilirubin 0.0 - 1.2 mg/dL 0.2 - -  Alkaline Phos 39 - 117 IU/L 80 - -  AST 0 - 40 IU/L 15 - -  ALT 0 - 32 IU/L 15 - -   Pathology report 01/20/2009 BONE MARROW, RIGHT POSTERIOR ILIAC CREST, BIOPSY AND ASPIRATION: - CELLULAR MARROW WITH TRILINEAGE HEMATOPOIESIS AND MATURATION. - NO EVIDENCE OF EXCESS BLASTS.   BONE  MARROW ASPIRATE: The aspirate material is composed of a few somewhat cellular bone marrow particles displaying a mixture of cell types. Erythroid and granulocytic series show orderly and progressive maturation. Megakaryocytes are abundant with predominantly normal morphology.  BONE MARROW CORE BIOPSY: The bone marrow core biopsy has very prominent aspiration artifact. Sections show 40 to 60% cellularity with a mixture of cell types. The megakaryocytes are abundant with normal morphology. No megakaryocyte clustering is seen. The granulocytic series and erythroid series show orderly and progressive maturation. No excess blasts are seen.  Karyotype: 46,XX[20].nuc ish (ABL,BCR)x2  Interpretation: Cytogenetic Analysis:Normal: Cytogenetic analysis revealed the presence of normal female chromosomes with no observable clonal chromosomal abnormalities.  Molecular Cytogenetic Analysis FISH: Normal: The investigative technique of molecular cytogenetic analysis with DNA probes specific for the Major/Minor ABL (9q34) and BCR (22q11) genes revealed the lack of significant number of BCR/ABL fusion  signals. These results are consistent with a normal finding and a lack of the BCR/ABL fusion associated with CML/ALL.  RADIOGRAPHIC STUDIES:  CT Abdomen without Contrast 12/30/15 IMPRESSION: 1. Midline supraumbilical ventral hernia contains unobstructed colon. 2. Two smaller fat containing periumbilical hernias.  ASSESSMENT: Essential thrombocythemia. Diagnosed in 2010. JAK2 mutation was present. The patient underwent a bone marrow aspirate and biopsy on 01/20/2009 which yielded a specimen that was suboptimal for evaluation. However, megakaryocytes were abundant with normal morphology. There was no clustering or excess blasts seen. Platelet count on 01/20/2009 was 881. The patient is being treated with aspirin and Anagrelide.   PLAN:  1. Essential Thrombocythemia. JAK2 (+)  --I previously reviewed her above laboratory results and history extensively. I explained to her this chronic disease, not curable but treatable. Small percentage patient will develop myelofibrosis or leukemia in late stage. The disease related risk is thrombosis, including stroke and heart attack. -Her platelet count has been well controlled  -Her previous syncope and seizure likely activities are probably not related to hydrea, also I cannot rule out completely. -The patient was concerned about long-term side effects from [facility], especially risk of leukemia. -I have previously switched her Hydrea to anagrelide (October 2017), she is tolerating very well, her platelet counts has been normal. Will continue anagrelide -We'll monitor her CBC in 2 months. Platelets stable at 351 K TODAY.  -Continue aspirin. - I encouraged the patient to maintain good hydration.  2. Seizure -continue meds, follow up with her neurologist.  3. Syncope episode -Unclear etiology. -I suspect the possibility of her syncope is probably not related to medications.  4. AKI -She has slightly elevated creatinine today, likely related to her  diuretics, which was recommended by her cardiologist. -I encouraged patient to discuss with her cardiologist to see if needed she needs back of her diuretics. She has no clinical signs of edema or fluid overloaded.  5. Diabetes -She has been seeing a nutritionist at church. -She monitors her sugar level regularly -She has intentionally lost about 17 lbs.  6. Coronary artery disease -Coronary artery disease status post bypass surgery grafting x 2 by Dr. Cyndia Bent in June 2018. -She is currently in cardiac rehab  PLAN: - Labs reviewed today with the patient - I refilled her anagrelide today - Labs in 2 months. - Return to clinic for follow up in 4 months.   All questions were answered. The patient knows to call the clinic with any problems, questions or concerns. We can certainly see the patient much sooner if necessary.  I spent 20 minutes counseling the patient face to face. The  total time spent in the appointment was 15 minutes.  This document serves as a record of services personally performed by Truitt Merle, MD. It was created on her behalf by Arlyce Harman, a trained medical scribe. The creation of this record is based on the scribe's personal observations and the provider's statements to them. This document has been checked and approved by the attending provider.   Truitt Merle  06/12/2017

## 2017-06-08 ENCOUNTER — Telehealth (HOSPITAL_COMMUNITY): Payer: Self-pay

## 2017-06-08 ENCOUNTER — Telehealth: Payer: Self-pay | Admitting: Physician Assistant

## 2017-06-08 NOTE — Telephone Encounter (Signed)
New message ° °Pt call requesting to speak with RN about lab results. Please call back to discuss  °

## 2017-06-08 NOTE — Telephone Encounter (Signed)
Returned the call back to the patient with lab results:  Notes recorded by Almyra Deforest, PA on 06/06/2017 at 1:13 PM EDT Kidney function and electrolyte stable on current medication. Liver function normal. Cholesterol improved except for good cholesterol which is still low. Continue exercise   She verbalized her understanding.

## 2017-06-08 NOTE — Telephone Encounter (Signed)
I called patient to discuss scheduling for cardiac rehab. Patient was not at home, I left my contact information with family member for patient to return my call.

## 2017-06-09 ENCOUNTER — Telehealth (HOSPITAL_COMMUNITY): Payer: Self-pay

## 2017-06-12 ENCOUNTER — Telehealth: Payer: Self-pay | Admitting: Hematology

## 2017-06-12 ENCOUNTER — Encounter: Payer: Self-pay | Admitting: Hematology

## 2017-06-12 ENCOUNTER — Other Ambulatory Visit (HOSPITAL_BASED_OUTPATIENT_CLINIC_OR_DEPARTMENT_OTHER): Payer: BC Managed Care – PPO

## 2017-06-12 ENCOUNTER — Ambulatory Visit (HOSPITAL_BASED_OUTPATIENT_CLINIC_OR_DEPARTMENT_OTHER): Payer: BC Managed Care – PPO | Admitting: Hematology

## 2017-06-12 VITALS — BP 158/63 | HR 68 | Temp 98.3°F | Resp 17 | Ht 62.0 in | Wt 208.4 lb

## 2017-06-12 DIAGNOSIS — D473 Essential (hemorrhagic) thrombocythemia: Secondary | ICD-10-CM

## 2017-06-12 DIAGNOSIS — N179 Acute kidney failure, unspecified: Secondary | ICD-10-CM

## 2017-06-12 DIAGNOSIS — E119 Type 2 diabetes mellitus without complications: Secondary | ICD-10-CM

## 2017-06-12 DIAGNOSIS — R55 Syncope and collapse: Secondary | ICD-10-CM

## 2017-06-12 DIAGNOSIS — R569 Unspecified convulsions: Secondary | ICD-10-CM

## 2017-06-12 DIAGNOSIS — I25810 Atherosclerosis of coronary artery bypass graft(s) without angina pectoris: Secondary | ICD-10-CM

## 2017-06-12 LAB — CBC WITH DIFFERENTIAL/PLATELET
BASO%: 0.7 % (ref 0.0–2.0)
Basophils Absolute: 0 10*3/uL (ref 0.0–0.1)
EOS ABS: 0.6 10*3/uL — AB (ref 0.0–0.5)
EOS%: 9.5 % — ABNORMAL HIGH (ref 0.0–7.0)
HEMATOCRIT: 40.2 % (ref 34.8–46.6)
HEMOGLOBIN: 13.2 g/dL (ref 11.6–15.9)
LYMPH%: 25.5 % (ref 14.0–49.7)
MCH: 28.1 pg (ref 25.1–34.0)
MCHC: 32.8 g/dL (ref 31.5–36.0)
MCV: 85.5 fL (ref 79.5–101.0)
MONO#: 0.4 10*3/uL (ref 0.1–0.9)
MONO%: 6.4 % (ref 0.0–14.0)
NEUT%: 57.9 % (ref 38.4–76.8)
NEUTROS ABS: 3.4 10*3/uL (ref 1.5–6.5)
PLATELETS: 351 10*3/uL (ref 145–400)
RBC: 4.71 10*6/uL (ref 3.70–5.45)
RDW: 14.4 % (ref 11.2–14.5)
WBC: 5.9 10*3/uL (ref 3.9–10.3)
lymph#: 1.5 10*3/uL (ref 0.9–3.3)

## 2017-06-12 MED ORDER — ANAGRELIDE HCL 1 MG PO CAPS: 1.0000 mg | ORAL_CAPSULE | Freq: Every day | ORAL | 5 refills | Status: DC

## 2017-06-12 NOTE — Telephone Encounter (Signed)
Scheduled appt per 8/20 los - Gave patient AVS and calender per los.  

## 2017-06-13 NOTE — Telephone Encounter (Signed)
Cardiac Rehab Medication Review by a Pharmacist   Does the patient feel that his/her medications are working for him/her?  yes  Has the patient been experiencing any side effects to the medications prescribed?  no  Does the patient measure his/her own blood pressure or blood glucose at home?  no -  has one at home but feels like it's not in sync. I told her to bring in her meter and we can see if it's consistent with what blood pressure readings we get in clinic.   Does the patient have any problems obtaining medications due to transportation or finances?   no - but states [name] is most expensive.   Understanding of regimen: good Understanding of indications: good Potential of compliance: good  Pharmacist comments: Very pleasant and understanding of her medicines.   Diana L. Kyung Rudd, PharmD, [place] PGY1 Pharmacy Resident Pager: [phone]

## 2017-06-14 ENCOUNTER — Telehealth (HOSPITAL_COMMUNITY): Payer: Self-pay

## 2017-06-14 NOTE — Telephone Encounter (Signed)
*  Updated insurance benefits* [employer] - no co-payment, deductible $1250/$1250 has been met, out of pocket $4350/$4350 has been met, no co-insurance and no pre-authorization. Passport/reference [id number].

## 2017-06-15 ENCOUNTER — Encounter (HOSPITAL_COMMUNITY)
Admission: RE | Admit: 2017-06-15 | Discharge: 2017-06-15 | Disposition: A | Discharge status: Home / Self Care | Payer: BC Managed Care – PPO | Source: Ambulatory Visit | Attending: Cardiovascular Disease | Admitting: Cardiovascular Disease

## 2017-06-15 ENCOUNTER — Encounter (HOSPITAL_COMMUNITY): Payer: Self-pay

## 2017-06-15 VITALS — BP 128/80 | HR 73 | Ht 62.0 in | Wt 211.0 lb

## 2017-06-15 DIAGNOSIS — Z951 Presence of aortocoronary bypass graft: Secondary | ICD-10-CM | POA: Diagnosis present

## 2017-06-15 DIAGNOSIS — I214 Non-ST elevation (NSTEMI) myocardial infarction: Secondary | ICD-10-CM | POA: Insufficient documentation

## 2017-06-15 DIAGNOSIS — Z48812 Encounter for surgical aftercare following surgery on the circulatory system: Secondary | ICD-10-CM | POA: Diagnosis present

## 2017-06-15 NOTE — Progress Notes (Signed)
Cardiac Individual Treatment Plan  Patient Details  Name: Sara [name] MRN: [id number] Date of Birth: 28-Jun-1956 Referring Provider:     [facility] from 06/15/2017 in [place]  Referring Provider  Croitoru,      Initial Encounter Date:    CARDIAC REHAB PHASE II ORIENTATION from 06/15/2017 in [place]  Date  06/15/17  Referring Provider  Croitoru,      Visit Diagnosis: 04/15/17 NSTEMI (non-ST elevated myocardial infarction) ([facility])  04/21/17 S/P CABG x 2  Patient's Home Medications on Admission:  Current Outpatient Prescriptions:  .  acetaminophen (TYLENOL) 325 MG tablet, Take 2 tablets (650 mg total) by mouth every 6 (six) hours as needed for mild pain., Disp: , Rfl:  .  anagrelide ([name]) 1 MG capsule, Take 1 capsule (1 mg total) by mouth daily., Disp: 30 capsule, Rfl: 5 .  aspirin EC 325 MG EC tablet, Take 1 tablet (325 mg total) by mouth daily., Disp: 30 tablet, Rfl: 0 .  atorvastatin (LIPITOR) 40 MG tablet, Take 1 tablet (40 mg total) by mouth daily at 6 PM., Disp: 30 tablet, Rfl: 11 .  B Complex Vitamins (B COMPLEX PO), Take 1 tablet by mouth daily. , Disp: , Rfl:  .  Cholecalciferol (VITAMIN D3) 2000 UNITS capsule, Take 2,000 Units by mouth daily. , Disp: , Rfl:  .  lacosamide ([name]) 200 MG TABS tablet, Take 0.5-1 tablets (100-200 mg total) by mouth 2 (two) times daily. 500 mg in the AM and 150 mg at bedtime, Disp: 60 tablet, Rfl:  .  metoprolol tartrate 75 MG TABS, Take 75 mg by mouth 2 (two) times daily., Disp: 30 tablet, Rfl: 11 .  telmisartan (MICARDIS) 20 MG tablet, Take 1 tablet (20 mg total) by mouth daily., Disp: 30 tablet, Rfl: 11 .  traMADol (ULTRAM) 50 MG tablet, Take 50 mg by mouth every 4-6 hours for severe pain. (Patient not taking: Reported on 06/13/2017), Disp: 30 tablet, Rfl: 0  Past Medical History: Past Medical History:  Diagnosis Date  . Diabetes mellitus   .  Gout   . Hyperlipemia   . Hypertension   . LVH (left ventricular hypertrophy)    12/21/09 Echo -mild asymmetric LVH,EF =>55%  . Morbid obesity ([place])   . OSA (obstructive sleep apnea)   . PAD (peripheral artery disease) ([place])   . Renal artery stenosis ([place]) 12/13/2010   PTCA and Stent - right  . Seizures ([place])   . Syncopal episodes   . Thrombocytosis ([facility])     Tobacco Use: History  Smoking Status  . Never Smoker  Smokeless Tobacco  . Never Used    Labs: Recent Review Flowsheet Data    Labs for ITP Cardiac and Pulmonary Rehab Latest Ref Rng & Units 04/21/2017 04/21/2017 04/21/2017 04/22/2017 06/05/2017   Cholestrol 100 - 199 mg/dL - - - - 86(L)   LDLCALC 0 - 99 mg/dL - - - - 39   HDL >39 mg/dL - - - - 37(L)   Trlycerides 0 - 149 mg/dL - - - - 48   Hemoglobin A1c 4.8 - 5.6 % - - - - -   PHART 7.350 - 7.450 7.310(L) - 7.319(L) - -   PCO2ART 32.0 - 48.0 mmHg 47.3 - 45.3 - -   HCO3 20.0 - 28.0 mmol/L 23.9 - 23.4 - -   TCO2 0 - 100 mmol/L 25 24 25 25  -   ACIDBASEDEF 0.0 - 2.0  mmol/L 3.0(H) - 3.0(H) - -   O2SAT % 97.0 - 97.0 - -      Capillary Blood Glucose: Lab Results  Component Value Date   GLUCAP 124 (H) 04/26/2017   GLUCAP 111 (H) 04/26/2017   GLUCAP 123 (H) 04/25/2017   GLUCAP 95 04/25/2017   GLUCAP 119 (H) 04/25/2017     Exercise Target Goals: Date: 06/15/17  Exercise Program Goal: Individual exercise prescription set with THRR, safety & activity barriers. Participant demonstrates ability to understand and report RPE using BORG scale, to self-measure pulse accurately, and to acknowledge the importance of the exercise prescription.  Exercise Prescription Goal: Starting with aerobic activity 30 plus minutes a day, 3 days per week for initial exercise prescription. Provide home exercise prescription and guidelines that participant acknowledges understanding prior to discharge.  Activity Barriers & Risk Stratification:     Activity Barriers & Cardiac Risk  Stratification - 06/15/17 1223      Activity Barriers & Cardiac Risk Stratification   Activity Barriers Deconditioning;Muscular Weakness      6 Minute Walk:     6 Minute Walk    Row Name 06/15/17 1222         6 Minute Walk   Phase Initial     Distance 1237 feet     Walk Time 6 minutes     # of Rest Breaks 0     MPH 2.34     METS 2.8     RPE 9     VO2 Peak 9.83     Symptoms No     Resting HR 73 bpm     Resting BP 128/80     Max Ex. HR 100 bpm     Max Ex. BP 162/80     2 Minute Post BP 144/82        Oxygen Initial Assessment:   Oxygen Re-Evaluation:   Oxygen Discharge (Final Oxygen Re-Evaluation):   Initial Exercise Prescription:     Initial Exercise Prescription - 06/15/17 1200      Date of Initial Exercise RX and Referring Provider   Date 06/15/17   Referring Provider Croitoru,     Treadmill   MPH 2.3   Grade 0   Minutes 10   METs 2.76     Recumbant Bike   Level 2   Minutes 10   METs 2     NuStep   Level 2   Minutes 10   METs 2     Prescription Details   Frequency (times per week) 3   Duration Progress to 30 minutes of continuous aerobic without signs/symptoms of physical distress     Intensity   THRR 40-80% of Max Heartrate 63-126   Ratings of Perceived Exertion 11-13   Perceived Dyspnea 0-4     Progression   Progression Continue to progress workloads to maintain intensity without signs/symptoms of physical distress.     Resistance Training   Training Prescription Yes   Weight 2lbs   Reps 10-15      Perform Capillary Blood Glucose checks as needed.  Exercise Prescription Changes:   Exercise Comments:   Exercise Goals and Review:     Exercise Goals    Row Name 06/15/17 0933             Exercise Goals   Increase Physical Activity Yes       Intervention Provide advice, education, support and counseling about physical activity/exercise needs.;Develop an individualized exercise prescription for aerobic and resistive  training based on initial evaluation findings, risk stratification, comorbidities and participant's personal goals.       Expected Outcomes Achievement of increased cardiorespiratory fitness and enhanced flexibility, muscular endurance and strength shown through measurements of functional capacity and personal statement of participant.       Increase Strength and Stamina Yes       Intervention Provide advice, education, support and counseling about physical activity/exercise needs.;Develop an individualized exercise prescription for aerobic and resistive training based on initial evaluation findings, risk stratification, comorbidities and participant's personal goals.       Expected Outcomes Achievement of increased cardiorespiratory fitness and enhanced flexibility, muscular endurance and strength shown through measurements of functional capacity and personal statement of participant.          Exercise Goals Re-Evaluation :    Discharge Exercise Prescription (Final Exercise Prescription Changes):   Nutrition:  Target Goals: Understanding of nutrition guidelines, daily intake of sodium 1500mg , cholesterol 200mg , calories 30% from fat and 7% or less from saturated fats, daily to have 5 or more servings of fruits and vegetables.  Biometrics:     Pre Biometrics - 06/15/17 1239      Pre Biometrics   Waist Circumference 41 inches   Hip Circumference 48 inches   Waist to Hip Ratio 0.85 %   Triceps Skinfold 40 mm   % Body Fat 29.5 %   Grip Strength 27 kg   Flexibility 9 in   Single Leg Stand 1.56 seconds       Nutrition Therapy Plan and Nutrition Goals:     Nutrition Therapy & Goals - 06/15/17 1232      Nutrition Therapy   Diet Carb Modified, Therapeutic Lifestyle Changes     Personal Nutrition Goals   Nutrition Goal Pt to identify food quantities necessary to achieve weight loss of 6-24 lb (2.7-10.9 kg) at graduation from cardiac rehab. Goal wt of < 200 lb desired.       Intervention Plan   Intervention Prescribe, educate and counsel regarding individualized specific dietary modifications aiming towards targeted core components such as weight, hypertension, lipid management, diabetes, heart failure and other comorbidities.   Expected Outcomes Short Term Goal: Understand basic principles of dietary content, such as calories, fat, sodium, cholesterol and nutrients.;Long Term Goal: Adherence to prescribed nutrition plan.      Nutrition Discharge: Nutrition Scores:     Nutrition Assessments - 06/15/17 1227      MEDFICTS Scores   Pre Score 18      Nutrition Goals Re-Evaluation:   Nutrition Goals Re-Evaluation:   Nutrition Goals Discharge (Final Nutrition Goals Re-Evaluation):   Psychosocial: Target Goals: Acknowledge presence or absence of significant depression and/or stress, maximize coping skills, provide positive support system. Participant is able to verbalize types and ability to use techniques and skills needed for reducing stress and depression.  Initial Review & Psychosocial Screening:     Initial Psych Review & Screening - 06/15/17 1320      Initial Review   Current issues with None Identified     Family Dynamics   Good Support System? Yes     Barriers   Psychosocial barriers to participate in program There are no identifiable barriers or psychosocial needs.     Screening Interventions   Interventions Encouraged to exercise      Quality of Life Scores:     Quality of Life - 06/15/17 1239      Quality of Life Scores   Health/Function Pre 22.87 %  Socioeconomic Pre 22.25 %   Psych/Spiritual Pre 27.21 %   Family Pre 24 %   GLOBAL Pre 24.19 %      PHQ-9: Recent Review Flowsheet Data    There is no flowsheet data to display.     Interpretation of Total Score  Total Score Depression Severity:  1-4 = Minimal depression, 5-9 = Mild depression, 10-14 = Moderate depression, 15-19 = Moderately severe depression, 20-27 =  Severe depression   Psychosocial Evaluation and Intervention:   Psychosocial Re-Evaluation:   Psychosocial Discharge (Final Psychosocial Re-Evaluation):   Vocational Rehabilitation: Provide vocational rehab assistance to qualifying candidates.   Vocational Rehab Evaluation & Intervention:   Education: Education Goals: Education classes will be provided on a weekly basis, covering required topics. Participant will state understanding/return demonstration of topics presented.  Learning Barriers/Preferences:     Learning Barriers/Preferences - 06/15/17 7564      Learning Barriers/Preferences   Learning Barriers Sight   Learning Preferences Skilled Demonstration      Education Topics: Count Your Pulse:  -Group instruction provided by verbal instruction, demonstration, patient participation and written materials to support subject.  Instructors address importance of being able to find your pulse and how to count your pulse when at home without a heart monitor.  Patients get hands on experience counting their pulse with staff help and individually.   Heart Attack, Angina, and Risk Factor Modification:  -Group instruction provided by verbal instruction, video, and written materials to support subject.  Instructors address signs and symptoms of angina and heart attacks.    Also discuss risk factors for heart disease and how to make changes to improve heart health risk factors.   Functional Fitness:  -Group instruction provided by verbal instruction, demonstration, patient participation, and written materials to support subject.  Instructors address safety measures for doing things around the house.  Discuss how to get up and down off the floor, how to pick things up properly, how to safely get out of a chair without assistance, and balance training.   Meditation and Mindfulness:  -Group instruction provided by verbal instruction, patient participation, and written materials to  support subject.  Instructor addresses importance of mindfulness and meditation practice to help reduce stress and improve awareness.  Instructor also leads participants through a meditation exercise.    Stretching for Flexibility and Mobility:  -Group instruction provided by verbal instruction, patient participation, and written materials to support subject.  Instructors lead participants through series of stretches that are designed to increase flexibility thus improving mobility.  These stretches are additional exercise for major muscle groups that are typically performed during regular warm up and cool down.   Hands Only CPR:  -Group verbal, video, and participation provides a basic overview of AHA guidelines for community CPR. Role-play of emergencies allow participants the opportunity to practice calling for help and chest compression technique with discussion of AED use.   Hypertension: -Group verbal and written instruction that provides a basic overview of hypertension including the most recent diagnostic guidelines, risk factor reduction with self-care instructions and medication management.    Nutrition I class: Heart Healthy Eating:  -Group instruction provided by PowerPoint slides, verbal discussion, and written materials to support subject matter. The instructor gives an explanation and review of the Therapeutic Lifestyle Changes diet recommendations, which includes a discussion on lipid goals, dietary fat, sodium, fiber, plant stanol/sterol esters, sugar, and the components of a well-balanced, healthy diet.   Nutrition II class: Lifestyle Skills:  -Group instruction provided  by PowerPoint slides, verbal discussion, and written materials to support subject matter. The instructor gives an explanation and review of label reading, grocery shopping for heart health, heart healthy recipe modifications, and ways to make healthier choices when eating out.   Diabetes Question & Answer:   -Group instruction provided by PowerPoint slides, verbal discussion, and written materials to support subject matter. The instructor gives an explanation and review of diabetes co-morbidities, pre- and post-prandial blood glucose goals, pre-exercise blood glucose goals, signs, symptoms, and treatment of hypoglycemia and hyperglycemia, and foot care basics.   Diabetes Blitz:  -Group instruction provided by PowerPoint slides, verbal discussion, and written materials to support subject matter. The instructor gives an explanation and review of the physiology behind type 1 and type 2 diabetes, diabetes medications and rational behind using different medications, pre- and post-prandial blood glucose recommendations and Hemoglobin A1c goals, diabetes diet, and exercise including blood glucose guidelines for exercising safely.    Portion Distortion:  -Group instruction provided by PowerPoint slides, verbal discussion, written materials, and food models to support subject matter. The instructor gives an explanation of serving size versus portion size, changes in portions sizes over the last 20 years, and what consists of a serving from each food group.   Stress Management:  -Group instruction provided by verbal instruction, video, and written materials to support subject matter.  Instructors review role of stress in heart disease and how to cope with stress positively.     Exercising on Your Own:  -Group instruction provided by verbal instruction, power point, and written materials to support subject.  Instructors discuss benefits of exercise, components of exercise, frequency and intensity of exercise, and end points for exercise.  Also discuss use of nitroglycerin and activating EMS.  Review options of places to exercise outside of rehab.  Review guidelines for sex with heart disease.   Cardiac Drugs I:  -Group instruction provided by verbal instruction and written materials to support subject.   Instructor reviews cardiac drug classes: antiplatelets, anticoagulants, beta blockers, and statins.  Instructor discusses reasons, side effects, and lifestyle considerations for each drug class.   Cardiac Drugs II:  -Group instruction provided by verbal instruction and written materials to support subject.  Instructor reviews cardiac drug classes: angiotensin converting enzyme inhibitors (ACE-I), angiotensin II receptor blockers (ARBs), nitrates, and calcium channel blockers.  Instructor discusses reasons, side effects, and lifestyle considerations for each drug class.   Anatomy and Physiology of the Circulatory System:  Group verbal and written instruction and models provide basic cardiac anatomy and physiology, with the coronary electrical and arterial systems. Review of: AMI, Angina, Valve disease, Heart Failure, Peripheral Artery Disease, Cardiac Arrhythmia, Pacemakers, and the ICD.   Other Education:  -Group or individual verbal, written, or video instructions that support the educational goals of the cardiac rehab program.   Knowledge Questionnaire Score:     Knowledge Questionnaire Score - 06/15/17 1222      Knowledge Questionnaire Score   Pre Score 18/24      Core Components/Risk Factors/Patient Goals at Admission:     Personal Goals and Risk Factors at Admission - 06/15/17 1245      Core Components/Risk Factors/Patient Goals on Admission   Hypertension Yes   Intervention Provide education on lifestyle modifcations including regular physical activity/exercise, weight management, moderate sodium restriction and increased consumption of fresh fruit, vegetables, and low fat dairy, alcohol moderation, and smoking cessation.;Monitor prescription use compliance.   Expected Outcomes Short Term: Continued assessment and intervention until BP  is < 140/73mm HG in hypertensive participants. < 130/57mm HG in hypertensive participants with diabetes, heart failure or chronic kidney  disease.;Long Term: Maintenance of blood pressure at goal levels.   Lipids Yes   Intervention Provide education and support for participant on nutrition & aerobic/resistive exercise along with prescribed medications to achieve LDL 70mg , HDL >40mg .   Expected Outcomes Short Term: Participant states understanding of desired cholesterol values and is compliant with medications prescribed. Participant is following exercise prescription and nutrition guidelines.;Long Term: Cholesterol controlled with medications as prescribed, with individualized exercise RX and with personalized nutrition plan. Value goals: LDL < 70mg , HDL > 40 mg.   Stress Yes   Intervention Offer individual and/or small group education and counseling on adjustment to heart disease, stress management and health-related lifestyle change. Teach and support self-help strategies.;Refer participants experiencing significant psychosocial distress to appropriate mental health specialists for further evaluation and treatment. When possible, include family members and significant others in education/counseling sessions.   Expected Outcomes Long Term: Emotional wellbeing is indicated by absence of clinically significant psychosocial distress or social isolation.;Short Term: Participant demonstrates changes in health-related behavior, relaxation and other stress management skills, ability to obtain effective social support, and compliance with psychotropic medications if prescribed.   Intervention Provide exercise programming to assist with improving confidence and cardiovascular fitness. Provide nutrition couseling on HH diet and for weight loss/weight management      Core Components/Risk Factors/Patient Goals Review:    Core Components/Risk Factors/Patient Goals at Discharge (Final Review):    ITP Comments:     ITP Comments    Row Name 06/15/17 0905           ITP Comments Medical Director, Dr. Fransico Him          Comments:   Patient attended orientation from [facility] to 1030 to review rules and guidelines for program. Completed 6 minute walk test, Intitial ITP, and exercise prescription.  VSS. Telemetry-SR no noted ectopy.  Asymptomatic. Brief Psychosocial Assessment reveal no immediate barriers to participating in cardiac rehab.  Pt husband accompanied her to this appt.  Pt is looking forward to participating in the 1:15 exercise class. Cherre Huger, BSN Cardiac and Training and development officer

## 2017-06-15 NOTE — Progress Notes (Signed)
Sara [name] 61 y.o. female DOB: 05-Sep-1956 MRN: [id number]      Nutrition Note  1. 04/15/17 NSTEMI (non-ST elevated myocardial infarction) ([facility])   2. 04/21/17 S/P CABG x 2    Past Medical History:  Diagnosis Date  . Diabetes mellitus   . Gout   . Hyperlipemia   . Hypertension   . LVH (left ventricular hypertrophy)    12/21/09 Echo -mild asymmetric LVH,EF =>55%  . Morbid obesity ([place])   . OSA (obstructive sleep apnea)   . PAD (peripheral artery disease) ([place])   . Renal artery stenosis ([place]) 12/13/2010   PTCA and Stent - right  . Seizures ([place])   . Syncopal episodes   . Thrombocytosis ([place])    Meds reviewed.  HT: Ht Readings from Last 1 Encounters:  06/15/17 5\' 2"  (1.575 m)    WT: Wt Readings from Last 3 Encounters:  06/15/17 210 lb 15.7 oz (95.7 kg)  06/12/17 208 lb 6.4 oz (94.5 kg)  05/29/17 213 lb 3.2 oz (96.7 kg)     BMI 38.2   Current tobacco use? No  Labs:  Lipid Panel     Component Value Date/Time   CHOL 86 (L) 06/05/2017 0912   TRIG 48 06/05/2017 0912   HDL 37 (L) 06/05/2017 0912   CHOLHDL 2.3 06/05/2017 0912   CHOLHDL 2.7 04/17/2017 1000   VLDL 8 04/17/2017 1000   LDLCALC 39 06/05/2017 0912    Lab Results  Component Value Date   HGBA1C 6.6 (H) 04/16/2017   CBG (last 3)  No results for input(s): GLUCAP in the last 72 hours.  Nutrition Note Spoke with pt. Nutrition plan and goals reviewed with pt. Pt is following Step 2 of the Therapeutic Lifestyle Changes diet. Pt wants to lose wt. Pt has been trying to lose wt by decreasing portion sizes and increasing activity. Pt has lost 17 lb over the past 10 months. Wt loss tips reviewed. Pt is a diet-controlled diabetic. Last A1c indicates blood glucose well-controlled. Pt expressed understanding of the information reviewed. Pt aware of nutrition education classes offered and plans on attending nutrition classes.  Nutrition Diagnosis ? Food-and nutrition-related knowledge deficit related to lack of  exposure to information as related to diagnosis of: ? CVD ? DM ? Obesity related to excessive energy intake as evidenced by a BMI of 38.6  Nutrition Intervention ? Pt's individual nutrition plan and goals reviewed with pt. ? Pt given handouts for: ? Nutrition I class ? Nutrition II class  ? Diabetes Blitz Class ? Diabetes Q & A class  ? Consistent vit K diet ? low sodium ? DM ? pre-diabetes  Nutrition Goal(s):  ? Pt to identify food quantities necessary to achieve weight loss of 6-24 lb (2.7-10.9 kg) at graduation from cardiac rehab. Goal wt of < 200 lb desired.   Plan:  Pt to attend nutrition classes ? Nutrition I ? Nutrition II ? Portion Distortion ? Diabetes Blitz ? Diabetes Q & A Will provide client-centered nutrition education as part of interdisciplinary care.   Monitor and evaluate progress toward nutrition goal with team.  Derek Mound, M.Ed, RD, LDN, CDE 06/15/2017 12:27 PM

## 2017-06-17 ENCOUNTER — Encounter: Payer: Self-pay | Admitting: Hematology

## 2017-06-19 ENCOUNTER — Encounter (HOSPITAL_COMMUNITY): Payer: BC Managed Care – PPO

## 2017-06-19 ENCOUNTER — Encounter (HOSPITAL_COMMUNITY): Payer: Self-pay

## 2017-06-21 ENCOUNTER — Encounter (HOSPITAL_COMMUNITY): Payer: Self-pay

## 2017-06-21 ENCOUNTER — Encounter (HOSPITAL_COMMUNITY)
Admission: RE | Admit: 2017-06-21 | Discharge: 2017-06-21 | Disposition: A | Discharge status: Home / Self Care | Payer: BC Managed Care – PPO | Source: Ambulatory Visit | Attending: Cardiovascular Disease | Admitting: Cardiovascular Disease

## 2017-06-21 DIAGNOSIS — Z48812 Encounter for surgical aftercare following surgery on the circulatory system: Secondary | ICD-10-CM | POA: Diagnosis not present

## 2017-06-21 DIAGNOSIS — I214 Non-ST elevation (NSTEMI) myocardial infarction: Secondary | ICD-10-CM

## 2017-06-21 DIAGNOSIS — Z951 Presence of aortocoronary bypass graft: Secondary | ICD-10-CM

## 2017-06-21 NOTE — Progress Notes (Signed)
Daily Session Note  Patient Details  Name: Sara [name] MRN: [id number] Date of Birth: Aug 20, 1956 Referring Provider:     CARDIAC REHAB PHASE II ORIENTATION from 06/15/2017 in [place]  Referring Provider  Croitoru, Dani Gobble, MD      Encounter Date: 06/21/2017  Check In:     Session Check In - 06/21/17 1427      Check-In   Location [facility]   Staff Present Luetta Nutting Fair, MS, ACSM RCEP, Exercise Physiologist;Bresha Hosack, RN, Marga Melnick, RN, BSN   Supervising physician immediately available to respond to emergencies [facility] immediately available   Physician(s) Dr Allyson Sabal   Medication changes reported     No   Fall or balance concerns reported    No   Tobacco Cessation No Change   Warm-up and Cool-down Performed as group-led instruction   Resistance Training Performed Yes   VAD Patient? No     Pain Assessment   Currently in Pain? No/denies      Capillary Blood Glucose: No results found for this or any previous visit (from the past 24 hour(s)).      Exercise Prescription Changes - 06/21/17 1600      Response to Exercise   Blood Pressure (Admit) 118/84   Blood Pressure (Exercise) 140/80   Blood Pressure (Exit) 148/82   Heart Rate (Admit) 98 bpm   Heart Rate (Exercise) 113 bpm   Heart Rate (Exit) 74 bpm   Rating of Perceived Exertion (Exercise) 11   Symptoms none   Comments pt was oriented to exercise equipment today   Duration Continue with 30 min of aerobic exercise without signs/symptoms of physical distress.   Intensity THRR unchanged     Progression   Progression Continue to progress workloads to maintain intensity without signs/symptoms of physical distress.     Resistance Training   Training Prescription No     Treadmill   MPH 2.3   Grade 0   Minutes 10   METs 2.76     NuStep   Level 2   Minutes 10      History  Smoking Status  . Never Smoker  Smokeless Tobacco  . Never Used     Goals Met:  Exercise tolerated well  Goals Unmet:  Not Applicable  Comments: Pt started cardiac rehab today.  Pt tolerated light exercise without difficulty. VSS, telemetry-sinus rhythm,  asymptomatic.  Medication list reconciled. Pt denies barriers to medicaiton compliance.  PSYCHOSOCIAL ASSESSMENT:  PHQ-0.   However pt reports caregiver strain and fatigue from caring for adult son in her home with paranoid schizophrenia.  Psychiatric care is provided by [facility].  Pt also lives with her husband and has adult daughter who lives in [place].   Pt enjoys walking, listening to jazz music, spending time with friends and church.  Pt goals for cardiac rehab are to lose weight, learn lifestyle modifications to facilitate weight loss, lower blood pressure and cholesterol.  Pt also interested in meal planning.  Pt encouraged to participate in education offerings especially nutrition classes. Pt will return to work 07/16/2017 therefore will need to exit program early.   Pt oriented to exercise equipment and routine.    Understanding verbalized.   Dr. Fransico Him is Medical Director for Cardiac Rehab at [facility].

## 2017-06-23 ENCOUNTER — Encounter (HOSPITAL_COMMUNITY)
Admission: RE | Admit: 2017-06-23 | Discharge: 2017-06-23 | Disposition: A | Discharge status: Home / Self Care | Payer: BC Managed Care – PPO | Source: Ambulatory Visit | Attending: Cardiovascular Disease | Admitting: Cardiovascular Disease

## 2017-06-23 DIAGNOSIS — I214 Non-ST elevation (NSTEMI) myocardial infarction: Secondary | ICD-10-CM

## 2017-06-23 DIAGNOSIS — Z951 Presence of aortocoronary bypass graft: Secondary | ICD-10-CM

## 2017-06-23 DIAGNOSIS — Z48812 Encounter for surgical aftercare following surgery on the circulatory system: Secondary | ICD-10-CM | POA: Diagnosis not present

## 2017-06-28 ENCOUNTER — Encounter (HOSPITAL_COMMUNITY)
Admission: RE | Admit: 2017-06-28 | Discharge: 2017-06-28 | Disposition: A | Discharge status: Home / Self Care | Payer: BC Managed Care – PPO | Source: Ambulatory Visit | Attending: Cardiovascular Disease | Admitting: Cardiovascular Disease

## 2017-06-28 DIAGNOSIS — Z48812 Encounter for surgical aftercare following surgery on the circulatory system: Secondary | ICD-10-CM | POA: Insufficient documentation

## 2017-06-28 DIAGNOSIS — Z951 Presence of aortocoronary bypass graft: Secondary | ICD-10-CM

## 2017-06-28 DIAGNOSIS — I214 Non-ST elevation (NSTEMI) myocardial infarction: Secondary | ICD-10-CM | POA: Diagnosis present

## 2017-06-30 ENCOUNTER — Encounter (HOSPITAL_COMMUNITY): Payer: BC Managed Care – PPO

## 2017-07-03 ENCOUNTER — Encounter (HOSPITAL_COMMUNITY)
Admission: RE | Admit: 2017-07-03 | Discharge: 2017-07-03 | Disposition: A | Discharge status: Home / Self Care | Payer: BC Managed Care – PPO | Source: Ambulatory Visit | Attending: Cardiovascular Disease | Admitting: Cardiovascular Disease

## 2017-07-03 DIAGNOSIS — Z48812 Encounter for surgical aftercare following surgery on the circulatory system: Secondary | ICD-10-CM | POA: Diagnosis not present

## 2017-07-03 DIAGNOSIS — I214 Non-ST elevation (NSTEMI) myocardial infarction: Secondary | ICD-10-CM

## 2017-07-03 DIAGNOSIS — Z951 Presence of aortocoronary bypass graft: Secondary | ICD-10-CM

## 2017-07-04 ENCOUNTER — Telehealth: Payer: Self-pay | Admitting: Cardiovascular Disease

## 2017-07-04 NOTE — Telephone Encounter (Signed)
New message      Patient is calling to ask when can she return to work?  Please call

## 2017-07-04 NOTE — Telephone Encounter (Signed)
Returned call, spoke to patient. She was calling to get advise on a return to work clearance. Notes she was seen by Dr. Cyndia Bent for open heart surgery, also had follow up w APP at our office - wanted to make sure she got in touch with whoever needed to clear her for return to work.  Informed her that clearance would probably come from [facility] - there is a letter in [employer] prepared for this purpose - will route to [facility] staff for f/u.  Pt aware and will follow up w [employer].

## 2017-07-05 ENCOUNTER — Encounter (HOSPITAL_COMMUNITY)
Admission: RE | Admit: 2017-07-05 | Discharge: 2017-07-05 | Disposition: A | Discharge status: Home / Self Care | Payer: BC Managed Care – PPO | Source: Ambulatory Visit | Attending: Cardiovascular Disease | Admitting: Cardiovascular Disease

## 2017-07-05 DIAGNOSIS — Z48812 Encounter for surgical aftercare following surgery on the circulatory system: Secondary | ICD-10-CM | POA: Diagnosis not present

## 2017-07-05 DIAGNOSIS — Z951 Presence of aortocoronary bypass graft: Secondary | ICD-10-CM

## 2017-07-05 DIAGNOSIS — I214 Non-ST elevation (NSTEMI) myocardial infarction: Secondary | ICD-10-CM

## 2017-07-05 NOTE — Progress Notes (Signed)
Cardiac Individual Treatment Plan  Patient Details  Name: MOMO BRAUN MRN: [id number] Date of Birth: 1956-07-11 Referring Provider:     [facility] from 06/15/2017 in [facility]  Referring Provider  Croitoru, Dani Gobble, MD      Initial Encounter Date:    [facility] from 06/15/2017 in [facility]  Date  06/15/17  Referring Provider  Croitoru, Dani Gobble, MD      Visit Diagnosis: 04/15/17 NSTEMI (non-ST elevated myocardial infarction) ([place])  04/21/17 S/P CABG x 2  Patient's Home Medications on Admission:  Current Outpatient Prescriptions:  .  acetaminophen (TYLENOL) 325 MG tablet, Take 2 tablets (650 mg total) by mouth every 6 (six) hours as needed for mild pain., Disp: , Rfl:  .  anagrelide ([name]) 1 MG capsule, Take 1 capsule (1 mg total) by mouth daily., Disp: 30 capsule, Rfl: 5 .  aspirin EC 325 MG EC tablet, Take 1 tablet (325 mg total) by mouth daily., Disp: 30 tablet, Rfl: 0 .  atorvastatin (LIPITOR) 40 MG tablet, Take 1 tablet (40 mg total) by mouth daily at 6 PM., Disp: 30 tablet, Rfl: 11 .  B Complex Vitamins (B COMPLEX PO), Take 1 tablet by mouth daily. , Disp: , Rfl:  .  Cholecalciferol (VITAMIN D3) 2000 UNITS capsule, Take 2,000 Units by mouth daily. , Disp: , Rfl:  .  lacosamide ([name]) 200 MG TABS tablet, Take 0.5-1 tablets (100-200 mg total) by mouth 2 (two) times daily. 500 mg in the AM and 150 mg at bedtime, Disp: 60 tablet, Rfl:  .  metoprolol tartrate 75 MG TABS, Take 75 mg by mouth 2 (two) times daily., Disp: 30 tablet, Rfl: 11 .  telmisartan (MICARDIS) 20 MG tablet, Take 1 tablet (20 mg total) by mouth daily., Disp: 30 tablet, Rfl: 11 .  traMADol (ULTRAM) 50 MG tablet, Take 50 mg by mouth every 4-6 hours for severe pain., Disp: 30 tablet, Rfl: 0  Past Medical History: Past Medical History:  Diagnosis Date  . Diabetes mellitus   . Gout   . Hyperlipemia    . Hypertension   . LVH (left ventricular hypertrophy)    12/21/09 Echo -mild asymmetric LVH,EF =>55%  . Morbid obesity ([place])   . OSA (obstructive sleep apnea)   . PAD (peripheral artery disease) ([place])   . Renal artery stenosis ([place]) 12/13/2010   PTCA and Stent - right  . Seizures ([place])   . Syncopal episodes   . Thrombocytosis ([facility])     Tobacco Use: History  Smoking Status  . Never Smoker  Smokeless Tobacco  . Never Used    Labs: Recent Review Flowsheet Data    Labs for ITP Cardiac and Pulmonary Rehab Latest Ref Rng & Units 04/21/2017 04/21/2017 04/21/2017 04/22/2017 06/05/2017   Cholestrol 100 - 199 mg/dL - - - - 86(L)   LDLCALC 0 - 99 mg/dL - - - - 39   HDL >39 mg/dL - - - - 37(L)   Trlycerides 0 - 149 mg/dL - - - - 48   Hemoglobin A1c 4.8 - 5.6 % - - - - -   PHART 7.350 - 7.450 7.310(L) - 7.319(L) - -   PCO2ART 32.0 - 48.0 mmHg 47.3 - 45.3 - -   HCO3 20.0 - 28.0 mmol/L 23.9 - 23.4 - -   TCO2 0 - 100 mmol/L _0 -   ACIDBASEDEF 0.0 - 2.0 mmol/L 3.0(H) -  3.0(H) - -   O2SAT % 97.0 - 97.0 - -      Capillary Blood Glucose: Lab Results  Component Value Date   GLUCAP 124 (H) 04/26/2017   GLUCAP 111 (H) 04/26/2017   GLUCAP 123 (H) 04/25/2017   GLUCAP 95 04/25/2017   GLUCAP 119 (H) 04/25/2017     Exercise Target Goals:    Exercise Program Goal: Individual exercise prescription set with THRR, safety & activity barriers. Participant demonstrates ability to understand and report RPE using BORG scale, to self-measure pulse accurately, and to acknowledge the importance of the exercise prescription.  Exercise Prescription Goal: Starting with aerobic activity 30 plus minutes a day, 3 days per week for initial exercise prescription. Provide home exercise prescription and guidelines that participant acknowledges understanding prior to discharge.  Activity Barriers & Risk Stratification:     Activity Barriers & Cardiac Risk Stratification - 06/15/17 1223      Activity  Barriers & Cardiac Risk Stratification   Activity Barriers Deconditioning;Muscular Weakness      6 Minute Walk:     6 Minute Walk    Row Name 06/15/17 1222         6 Minute Walk   Phase Initial     Distance 1237 feet     Walk Time 6 minutes     # of Rest Breaks 0     MPH 2.34     METS 2.8     RPE 9     VO2 Peak 9.83     Symptoms No     Resting HR 73 bpm     Resting BP 128/80     Max Ex. HR 100 bpm     Max Ex. BP 162/80     2 Minute Post BP 144/82        Oxygen Initial Assessment:   Oxygen Re-Evaluation:   Oxygen Discharge (Final Oxygen Re-Evaluation):   Initial Exercise Prescription:     Initial Exercise Prescription - 06/15/17 1200      Date of Initial Exercise RX and Referring Provider   Date 06/15/17   Referring Provider Croitoru, Mihai, MD     Treadmill   MPH 2.3   Grade 0   Minutes 10   METs 2.76     Recumbant Bike   Level 2   Minutes 10   METs 2     NuStep   Level 2   Minutes 10   METs 2     Prescription Details   Frequency (times per week) 3   Duration Progress to 30 minutes of continuous aerobic without signs/symptoms of physical distress     Intensity   THRR 40-80% of Max Heartrate 63-126   Ratings of Perceived Exertion 11-13   Perceived Dyspnea 0-4     Progression   Progression Continue to progress workloads to maintain intensity without signs/symptoms of physical distress.     Resistance Training   Training Prescription Yes   Weight 2lbs   Reps 10-15      Perform Capillary Blood Glucose checks as needed.  Exercise Prescription Changes:     Exercise Prescription Changes    Row Name 06/21/17 1600 07/03/17 1700           Response to Exercise   Blood Pressure (Admit) 118/84 138/80      Blood Pressure (Exercise) 140/80 134/70      Blood Pressure (Exit) 148/82 128/78      Heart Rate (Admit) 98 bpm 87 bpm  Heart Rate (Exercise) 113 bpm 111 bpm      Heart Rate (Exit) 74 bpm 82 bpm      Rating of Perceived  Exertion (Exercise) 11 11      Symptoms none none      Comments pt was oriented to exercise equipment today  -      Duration Continue with 30 min of aerobic exercise without signs/symptoms of physical distress. Continue with 30 min of aerobic exercise without signs/symptoms of physical distress.      Intensity THRR unchanged THRR unchanged        Progression   Progression Continue to progress workloads to maintain intensity without signs/symptoms of physical distress. Continue to progress workloads to maintain intensity without signs/symptoms of physical distress.      Average METs  - 2.6        Resistance Training   Training Prescription No Yes      Weight  - 2lbs      Reps  - 10-15      Time  - 10 Minutes        Treadmill   MPH 2.3  -      Grade 0  -      Minutes 10  -      METs 2.76  -        Recumbant Bike   Level  - 2      Minutes  - 15      METs  - 2.2        NuStep   Level 2 2      Minutes 10 15      METs  - 3         Exercise Comments:     Exercise Comments    Row Name 06/21/17 1640           Exercise Comments Pt was oriented to exercise equipment today. Pt responded well to exercise session; will continue to monitor pt's progress/activity levels.          Exercise Goals and Review:     Exercise Goals    Row Name 06/15/17 0933             Exercise Goals   Increase Physical Activity Yes       Intervention Provide advice, education, support and counseling about physical activity/exercise needs.;Develop an individualized exercise prescription for aerobic and resistive training based on initial evaluation findings, risk stratification, comorbidities and participant's personal goals.       Expected Outcomes Achievement of increased cardiorespiratory fitness and enhanced flexibility, muscular endurance and strength shown through measurements of functional capacity and personal statement of participant.       Increase Strength and Stamina Yes        Intervention Provide advice, education, support and counseling about physical activity/exercise needs.;Develop an individualized exercise prescription for aerobic and resistive training based on initial evaluation findings, risk stratification, comorbidities and participant's personal goals.       Expected Outcomes Achievement of increased cardiorespiratory fitness and enhanced flexibility, muscular endurance and strength shown through measurements of functional capacity and personal statement of participant.          Exercise Goals Re-Evaluation :     Exercise Goals Re-Evaluation    [place] Name 07/04/17 1441             Exercise Goal Re-Evaluation   Exercise Goals Review Increase Physical Activity;Able to understand and use rate of perceived exertion (RPE) scale;Knowledge and understanding  of Target Heart Rate Range (THRR);Understanding of Exercise Prescription;Increase Strength and Stamina       Comments Pt is progressing well in cardiac rehab. Pt has increased MET levels on Nustep machine.       Expected Outcomes Pt will continue to exercise for 30 minutes continously and improve in cardiorespiratory fitness.           Discharge Exercise Prescription (Final Exercise Prescription Changes):     Exercise Prescription Changes - 07/03/17 1700      Response to Exercise   Blood Pressure (Admit) 138/80   Blood Pressure (Exercise) 134/70   Blood Pressure (Exit) 128/78   Heart Rate (Admit) 87 bpm   Heart Rate (Exercise) 111 bpm   Heart Rate (Exit) 82 bpm   Rating of Perceived Exertion (Exercise) 11   Symptoms none   Duration Continue with 30 min of aerobic exercise without signs/symptoms of physical distress.   Intensity THRR unchanged     Progression   Progression Continue to progress workloads to maintain intensity without signs/symptoms of physical distress.   Average METs 2.6     Resistance Training   Training Prescription Yes   Weight 2lbs   Reps 10-15   Time 10 Minutes      Recumbant Bike   Level 2   Minutes 15   METs 2.2     NuStep   Level 2   Minutes 15   METs 3      Nutrition:  Target Goals: Understanding of nutrition guidelines, daily intake of sodium <1523m, cholesterol <2050m calories 30% from fat and 7% or less from saturated fats, daily to have 5 or more servings of fruits and vegetables.  Biometrics:     Pre Biometrics - 06/15/17 1239      Pre Biometrics   Waist Circumference 41 inches   Hip Circumference 48 inches   Waist to Hip Ratio 0.85 %   Triceps Skinfold 40 mm   % Body Fat 29.5 %   Grip Strength 27 kg   Flexibility 9 in   Single Leg Stand 1.56 seconds       Nutrition Therapy Plan and Nutrition Goals:     Nutrition Therapy & Goals - 06/15/17 1232      Nutrition Therapy   Diet Carb Modified, Therapeutic Lifestyle Changes     Personal Nutrition Goals   Nutrition Goal Pt to identify food quantities necessary to achieve weight loss of 6-24 lb (2.7-10.9 kg) at graduation from cardiac rehab. Goal wt of < 200 lb desired.      Intervention Plan   Intervention Prescribe, educate and counsel regarding individualized specific dietary modifications aiming towards targeted core components such as weight, hypertension, lipid management, diabetes, heart failure and other comorbidities.   Expected Outcomes Short Term Goal: Understand basic principles of dietary content, such as calories, fat, sodium, cholesterol and nutrients.;Long Term Goal: Adherence to prescribed nutrition plan.      Nutrition Discharge: Nutrition Scores:     Nutrition Assessments - 06/15/17 1227      MEDFICTS Scores   Pre Score 18      Nutrition Goals Re-Evaluation:   Nutrition Goals Re-Evaluation:   Nutrition Goals Discharge (Final Nutrition Goals Re-Evaluation):   Psychosocial: Target Goals: Acknowledge presence or absence of significant depression and/or stress, maximize coping skills, provide positive support system. Participant is able  to verbalize types and ability to use techniques and skills needed for reducing stress and depression.  Initial Review & Psychosocial Screening:  Initial Psych Review & Screening - 06/21/17 1705      Family Dynamics   Comments pt adult son diagnosed with paranoid schizophrenia, symptoms not well managed.  son does live in her home with psychiatric care provided by [facility] team.  pt exhibits caregiver strain and fatigue.       Quality of Life Scores:     Quality of Life - 06/15/17 1239      Quality of Life Scores   Health/Function Pre 22.87 %   Socioeconomic Pre 22.25 %   Psych/Spiritual Pre 27.21 %   Family Pre 24 %   GLOBAL Pre 24.19 %      PHQ-9: Recent Review Flowsheet Data    Depression screen [facility] 2/9 06/21/2017   Decreased Interest 0   Down, Depressed, Hopeless 0   PHQ - 2 Score 0     Interpretation of Total Score  Total Score Depression Severity:  1-4 = Minimal depression, 5-9 = Mild depression, 10-14 = Moderate depression, 15-19 = Moderately severe depression, 20-27 = Severe depression   Psychosocial Evaluation and Intervention:     Psychosocial Evaluation - 06/21/17 1707      Psychosocial Evaluation & Interventions   Interventions Encouraged to exercise with the program and follow exercise prescription;Stress management education;Relaxation education   Comments pt exhibits caregiver strain and fatigue. pt encouraged to participate in stress management and relaxation activities.    Expected Outcomes pt will exhibit improved outlook with good coping skills.     Continue Psychosocial Services  Follow up required by staff      Psychosocial Re-Evaluation:     Psychosocial Re-Evaluation    [place] Name 07/05/17 1702             Psychosocial Re-Evaluation   Current issues with None Identified       Comments no psychosocial needs identified, no interventions necessary        Expected Outcomes pt will exhibit positive outlook with good coping skills.         Interventions Encouraged to attend Cardiac Rehabilitation for the exercise;Stress management education;Relaxation education       Continue Psychosocial Services  No Follow up required          Psychosocial Discharge (Final Psychosocial Re-Evaluation):     Psychosocial Re-Evaluation - 07/05/17 1702      Psychosocial Re-Evaluation   Current issues with None Identified   Comments no psychosocial needs identified, no interventions necessary    Expected Outcomes pt will exhibit positive outlook with good coping skills.    Interventions Encouraged to attend Cardiac Rehabilitation for the exercise;Stress management education;Relaxation education   Continue Psychosocial Services  No Follow up required      Vocational Rehabilitation: Provide vocational rehab assistance to qualifying candidates.   Vocational Rehab Evaluation & Intervention:     Vocational Rehab - 06/21/17 1704      Initial Vocational Rehab Evaluation & Intervention   Assessment shows need for Vocational Rehabilitation No      Education: Education Goals: Education classes will be provided on a weekly basis, covering required topics. Participant will state understanding/return demonstration of topics presented.  Learning Barriers/Preferences:     Learning Barriers/Preferences - 06/15/17 8588      Learning Barriers/Preferences   Learning Barriers Sight   Learning Preferences Skilled Demonstration      Education Topics: Count Your Pulse:  -Group instruction provided by verbal instruction, demonstration, patient participation and written materials to support subject.  Instructors address importance of being able  to find your pulse and how to count your pulse when at home without a heart monitor.  Patients get hands on experience counting their pulse with staff help and individually.   Heart Attack, Angina, and Risk Factor Modification:  -Group instruction provided by verbal instruction, video, and written  materials to support subject.  Instructors address signs and symptoms of angina and heart attacks.    Also discuss risk factors for heart disease and how to make changes to improve heart health risk factors.   Functional Fitness:  -Group instruction provided by verbal instruction, demonstration, patient participation, and written materials to support subject.  Instructors address safety measures for doing things around the house.  Discuss how to get up and down off the floor, how to pick things up properly, how to safely get out of a chair without assistance, and balance training.   Meditation and Mindfulness:  -Group instruction provided by verbal instruction, patient participation, and written materials to support subject.  Instructor addresses importance of mindfulness and meditation practice to help reduce stress and improve awareness.  Instructor also leads participants through a meditation exercise.    Stretching for Flexibility and Mobility:  -Group instruction provided by verbal instruction, patient participation, and written materials to support subject.  Instructors lead participants through series of stretches that are designed to increase flexibility thus improving mobility.  These stretches are additional exercise for major muscle groups that are typically performed during regular warm up and cool down.   Hands Only CPR:  -Group verbal, video, and participation provides a basic overview of AHA guidelines for community CPR. Role-play of emergencies allow participants the opportunity to practice calling for help and chest compression technique with discussion of AED use.   Hypertension: -Group verbal and written instruction that provides a basic overview of hypertension including the most recent diagnostic guidelines, risk factor reduction with self-care instructions and medication management.    Nutrition I class: Heart Healthy Eating:  -Group instruction provided by PowerPoint  slides, verbal discussion, and written materials to support subject matter. The instructor gives an explanation and review of the Therapeutic Lifestyle Changes diet recommendations, which includes a discussion on lipid goals, dietary fat, sodium, fiber, plant stanol/sterol esters, sugar, and the components of a well-balanced, healthy diet.   Nutrition II class: Lifestyle Skills:  -Group instruction provided by PowerPoint slides, verbal discussion, and written materials to support subject matter. The instructor gives an explanation and review of label reading, grocery shopping for heart health, heart healthy recipe modifications, and ways to make healthier choices when eating out.   Diabetes Question & Answer:  -Group instruction provided by PowerPoint slides, verbal discussion, and written materials to support subject matter. The instructor gives an explanation and review of diabetes co-morbidities, pre- and post-prandial blood glucose goals, pre-exercise blood glucose goals, signs, symptoms, and treatment of hypoglycemia and hyperglycemia, and foot care basics.   Diabetes Blitz:  -Group instruction provided by PowerPoint slides, verbal discussion, and written materials to support subject matter. The instructor gives an explanation and review of the physiology behind type 1 and type 2 diabetes, diabetes medications and rational behind using different medications, pre- and post-prandial blood glucose recommendations and Hemoglobin A1c goals, diabetes diet, and exercise including blood glucose guidelines for exercising safely.    Portion Distortion:  -Group instruction provided by PowerPoint slides, verbal discussion, written materials, and food models to support subject matter. The instructor gives an explanation of serving size versus portion size, changes in portions sizes over the  last 20 years, and what consists of a serving from each food group.   Stress Management:  -Group instruction  provided by verbal instruction, video, and written materials to support subject matter.  Instructors review role of stress in heart disease and how to cope with stress positively.     Exercising on Your Own:  -Group instruction provided by verbal instruction, power point, and written materials to support subject.  Instructors discuss benefits of exercise, components of exercise, frequency and intensity of exercise, and end points for exercise.  Also discuss use of nitroglycerin and activating EMS.  Review options of places to exercise outside of rehab.  Review guidelines for sex with heart disease.   Cardiac Drugs I:  -Group instruction provided by verbal instruction and written materials to support subject.  Instructor reviews cardiac drug classes: antiplatelets, anticoagulants, beta blockers, and statins.  Instructor discusses reasons, side effects, and lifestyle considerations for each drug class.   Cardiac Drugs II:  -Group instruction provided by verbal instruction and written materials to support subject.  Instructor reviews cardiac drug classes: angiotensin converting enzyme inhibitors (ACE-I), angiotensin II receptor blockers (ARBs), nitrates, and calcium channel blockers.  Instructor discusses reasons, side effects, and lifestyle considerations for each drug class.   Anatomy and Physiology of the Circulatory System:  Group verbal and written instruction and models provide basic cardiac anatomy and physiology, with the coronary electrical and arterial systems. Review of: AMI, Angina, Valve disease, Heart Failure, Peripheral Artery Disease, Cardiac Arrhythmia, Pacemakers, and the ICD.   Other Education:  -Group or individual verbal, written, or video instructions that support the educational goals of the cardiac rehab program.   Knowledge Questionnaire Score:     Knowledge Questionnaire Score - 06/15/17 1222      Knowledge Questionnaire Score   Pre Score 18/24      Core  Components/Risk Factors/Patient Goals at Admission:     Personal Goals and Risk Factors at Admission - 06/15/17 1245      Core Components/Risk Factors/Patient Goals on Admission   Hypertension Yes   Intervention Provide education on lifestyle modifcations including regular physical activity/exercise, weight management, moderate sodium restriction and increased consumption of fresh fruit, vegetables, and low fat dairy, alcohol moderation, and smoking cessation.;Monitor prescription use compliance.   Expected Outcomes Short Term: Continued assessment and intervention until BP is < 140/69m HG in hypertensive participants. < 130/822mHG in hypertensive participants with diabetes, heart failure or chronic kidney disease.;Long Term: Maintenance of blood pressure at goal levels.   Lipids Yes   Intervention Provide education and support for participant on nutrition & aerobic/resistive exercise along with prescribed medications to achieve LDL <7032mHDL >32m90m Expected Outcomes Short Term: Participant states understanding of desired cholesterol values and is compliant with medications prescribed. Participant is following exercise prescription and nutrition guidelines.;Long Term: Cholesterol controlled with medications as prescribed, with individualized exercise RX and with personalized nutrition plan. Value goals: LDL < 70mg58mL > 40 mg.   Stress Yes   Intervention Offer individual and/or small group education and counseling on adjustment to heart disease, stress management and health-related lifestyle change. Teach and support self-help strategies.;Refer participants experiencing significant psychosocial distress to appropriate mental health specialists for further evaluation and treatment. When possible, include family members and significant others in education/counseling sessions.   Expected Outcomes Long Term: Emotional wellbeing is indicated by absence of clinically significant psychosocial distress  or social isolation.;Short Term: Participant demonstrates changes in health-related behavior, relaxation and other stress management skills,  ability to obtain effective social support, and compliance with psychotropic medications if prescribed.   Intervention Provide exercise programming to assist with improving confidence and cardiovascular fitness. Provide nutrition couseling on HH diet and for weight loss/weight management      Core Components/Risk Factors/Patient Goals Review:      Goals and Risk Factor Review    Row Name 07/05/17 1700             Core Components/Risk Factors/Patient Goals Review   Personal Goals Review Weight Management/Obesity;Hypertension;Lipids;Stress;Other       Review pt with multiple CAD RF demonstrates eagerness to participate in CR exercise, nutrition and education opportunities.        Expected Outcomes pt will participate in CR exercise, nutrition and lifestyle modiification to decrease overall RF.           Core Components/Risk Factors/Patient Goals at Discharge (Final Review):      Goals and Risk Factor Review - 07/05/17 1700      Core Components/Risk Factors/Patient Goals Review   Personal Goals Review Weight Management/Obesity;Hypertension;Lipids;Stress;Other   Review pt with multiple CAD RF demonstrates eagerness to participate in CR exercise, nutrition and education opportunities.    Expected Outcomes pt will participate in CR exercise, nutrition and lifestyle modiification to decrease overall RF.       ITP Comments:     ITP Comments    Row Name 06/15/17 0905 07/05/17 1659         ITP Comments Medical Director, Dr. Fransico Him Medical Director, Dr. Fransico Him         Comments: Pt is making expected progress toward personal goals after completing 4 sessions. Recommend continued exercise and life style modification education including  stress management and relaxation techniques to decrease cardiac risk profile.

## 2017-07-07 ENCOUNTER — Encounter (HOSPITAL_COMMUNITY): Payer: BC Managed Care – PPO

## 2017-07-10 ENCOUNTER — Encounter (HOSPITAL_COMMUNITY)
Admission: RE | Admit: 2017-07-10 | Discharge: 2017-07-10 | Disposition: A | Discharge status: Home / Self Care | Payer: BC Managed Care – PPO | Source: Ambulatory Visit | Attending: Cardiovascular Disease | Admitting: Cardiovascular Disease

## 2017-07-10 DIAGNOSIS — Z951 Presence of aortocoronary bypass graft: Secondary | ICD-10-CM

## 2017-07-10 DIAGNOSIS — Z48812 Encounter for surgical aftercare following surgery on the circulatory system: Secondary | ICD-10-CM | POA: Diagnosis not present

## 2017-07-10 DIAGNOSIS — I214 Non-ST elevation (NSTEMI) myocardial infarction: Secondary | ICD-10-CM

## 2017-07-12 ENCOUNTER — Encounter (HOSPITAL_COMMUNITY)
Admission: RE | Admit: 2017-07-12 | Discharge: 2017-07-12 | Disposition: A | Discharge status: Home / Self Care | Payer: BC Managed Care – PPO | Source: Ambulatory Visit | Attending: Cardiovascular Disease | Admitting: Cardiovascular Disease

## 2017-07-12 NOTE — Progress Notes (Signed)
Reviewed home exercise with pt today.  Pt plans to walk for exercise.  Reviewed THR, pulse, RPE, sign and symptoms, NTG use, and when to call 911 or MD.  Also discussed weather considerations and indoor options.  Pt voiced understanding.    Shruthi Northrup,MS,ACSM RCEP 

## 2017-07-14 ENCOUNTER — Encounter (HOSPITAL_COMMUNITY)
Admission: RE | Admit: 2017-07-14 | Discharge: 2017-07-14 | Disposition: A | Discharge status: Home / Self Care | Payer: BC Managed Care – PPO | Source: Ambulatory Visit | Attending: Cardiovascular Disease | Admitting: Cardiovascular Disease

## 2017-07-14 DIAGNOSIS — Z951 Presence of aortocoronary bypass graft: Secondary | ICD-10-CM

## 2017-07-14 DIAGNOSIS — I214 Non-ST elevation (NSTEMI) myocardial infarction: Secondary | ICD-10-CM

## 2017-07-14 DIAGNOSIS — Z48812 Encounter for surgical aftercare following surgery on the circulatory system: Secondary | ICD-10-CM | POA: Diagnosis not present

## 2017-07-17 ENCOUNTER — Encounter (HOSPITAL_COMMUNITY)
Admission: RE | Admit: 2017-07-17 | Discharge: 2017-07-17 | Disposition: A | Discharge status: Home / Self Care | Payer: BC Managed Care – PPO | Source: Ambulatory Visit | Attending: Cardiovascular Disease | Admitting: Cardiovascular Disease

## 2017-07-17 DIAGNOSIS — Z951 Presence of aortocoronary bypass graft: Secondary | ICD-10-CM

## 2017-07-17 DIAGNOSIS — Z48812 Encounter for surgical aftercare following surgery on the circulatory system: Secondary | ICD-10-CM | POA: Diagnosis not present

## 2017-07-17 DIAGNOSIS — I214 Non-ST elevation (NSTEMI) myocardial infarction: Secondary | ICD-10-CM

## 2017-07-19 ENCOUNTER — Encounter (HOSPITAL_COMMUNITY)
Admission: RE | Admit: 2017-07-19 | Discharge: 2017-07-19 | Disposition: A | Discharge status: Home / Self Care | Payer: BC Managed Care – PPO | Source: Ambulatory Visit | Attending: Cardiovascular Disease | Admitting: Cardiovascular Disease

## 2017-07-19 DIAGNOSIS — I214 Non-ST elevation (NSTEMI) myocardial infarction: Secondary | ICD-10-CM

## 2017-07-19 DIAGNOSIS — Z48812 Encounter for surgical aftercare following surgery on the circulatory system: Secondary | ICD-10-CM | POA: Diagnosis not present

## 2017-07-19 DIAGNOSIS — Z951 Presence of aortocoronary bypass graft: Secondary | ICD-10-CM

## 2017-07-20 NOTE — Progress Notes (Signed)
Sara [name] 61 y.o. female DOB: Jul 25, 1956 MRN: [id number]      Nutrition Note  1. 04/15/17 NSTEMI (non-ST elevated myocardial infarction) ([place])   2. 04/21/17 S/P CABG x 2    Wt Readings from Last 3 Encounters:  06/15/17 210 lb 15.7 oz (95.7 kg)  06/12/17 208 lb 6.4 oz (94.5 kg)  05/29/17 213 lb 3.2 oz (96.7 kg)    Note Spoke with pt. Nutrition plan and survey reviewed with pt. Pt is following Step 2 of the Therapeutic Lifestyle Changes diet. Pt wants to lose wt. Pt has been trying to lose. Pt c/o hitting a wt loss plateau. Ways to overcome a wt loss plateau discussed.  Pt is a diet-controlled diabetic. Pt expressed understanding of the information reviewed. Pt aware of nutrition education classes offered.  Nutrition Diagnosis ? Food-and nutrition-related knowledge deficit related to lack of exposure to information as related to diagnosis of: ? CVD ? DM ? Obesity related to excessive energy intake as evidenced by a BMI of 38.6  Nutrition Intervention ? Pt's individual nutrition plan and goals reviewed with pt. ? Benefits of adopting Therapeutic Lifestyle Changes discussed when Medficts reviewed.   ? Pt given handouts for: ? Nutrition I class ? Nutrition II class   Nutrition Goal(s):  ? Pt to identify food quantities necessary to achieve weight loss of 6-24 lb (2.7-10.9 kg) at graduation from cardiac rehab. Goal wt of < 200 lb desired.   Plan:  Pt to attend nutrition classes ? Nutrition I ? Nutrition II ? Portion Distortion ? Diabetes Blitz ? Diabetes Q & A Will provide client-centered nutrition education as part of interdisciplinary care.   Monitor and evaluate progress toward nutrition goal with team.  Derek Mound, M.Ed, RD, LDN, CDE 07/20/2017 11:25 AM

## 2017-07-21 ENCOUNTER — Encounter (HOSPITAL_COMMUNITY): Payer: BC Managed Care – PPO

## 2017-07-21 ENCOUNTER — Encounter: Payer: Self-pay | Admitting: Neurology

## 2017-07-21 ENCOUNTER — Ambulatory Visit (INDEPENDENT_AMBULATORY_CARE_PROVIDER_SITE_OTHER): Payer: BC Managed Care – PPO | Admitting: Neurology

## 2017-07-21 VITALS — BP 169/73 | HR 76 | Ht 62.0 in | Wt 208.5 lb

## 2017-07-21 DIAGNOSIS — R569 Unspecified convulsions: Secondary | ICD-10-CM | POA: Diagnosis not present

## 2017-07-21 MED ORDER — VIMPAT 100 MG PO TABS: ORAL_TABLET | ORAL | 3 refills | Status: DC

## 2017-07-21 NOTE — Progress Notes (Signed)
Reason for visit: Seizures  Sara [name] is an 61 y.o. female  History of present illness:  Sara [name] is a 61 year old right-handed black female with a history of partial complex type seizures. The patient had a seizure around 02/24/2017, she went into the hospital towards the end of June 2018 with chest pain and was found to have coronary artery disease and sustained a myocardial infarction. The patient underwent a two-vessel bypass procedure and was discharged around 04/25/2017. The patient has been in cardiac rehabilitation and she has done well with this. She had a seizure while in the hospital because her [name] dose was not started. The patient has not had any seizures since that time. She is tolerating [name] taking 50 mg in the morning and 150 mg in the evening. The patient has lost about 20 pounds of weight since May of 2018. She has noted that her blood sugars have been much better controlled recently. She returns for an evaluation. She will be returning to work next week.  Past Medical History:  Diagnosis Date  . Diabetes mellitus   . Gout   . Hyperlipemia   . Hypertension   . LVH (left ventricular hypertrophy)    12/21/09 Echo -mild asymmetric LVH,EF =>55%  . Morbid obesity ([place])   . OSA (obstructive sleep apnea)   . PAD (peripheral artery disease) ([place])   . Renal artery stenosis ([place]) 12/13/2010   PTCA and Stent - right  . Seizures ([place])   . Syncopal episodes   . Thrombocytosis ([place])     Past Surgical History:  Procedure Laterality Date  . BREAST CYST EXCISION     S/P Benign Right Breast Cyst Removal.  . BREAST REDUCTION SURGERY  1995   S/P Bilateral breast reduction  . [place]   Times  Two.  . CORONARY ARTERY BYPASS GRAFT N/A 04/21/2017   Procedure: CORONARY ARTERY BYPASS GRAFTING (CABG) x 2, USING LEFT MAMMARY ARTERY AND RIGHT GREATER SAPHENOUS VEIN HARVESTED ENDOSCOPICALLY;  Surgeon: [name], MD;  Location: [facility];  Service: Open  Heart Surgery;  Laterality: N/A;  . EXTREMITY CYST EXCISION  1993   left wrist  . LEFT HEART CATH AND CORONARY ANGIOGRAPHY N/A 04/17/2017   Procedure: Left Heart Cath and Coronary Angiography;  Surgeon: Jettie Booze, MD;  Location: [facility];  Service: Cardiovascular;  Laterality: N/A;  . PARTIAL HYSTERECTOMY  1994  . renal artery stent placement Right   . TEE WITHOUT CARDIOVERSION N/A 04/21/2017   Procedure: TRANSESOPHAGEAL ECHOCARDIOGRAM (TEE);  Surgeon: [name], MD;  Location: [facility];  Service: Open Heart Surgery;  Laterality: N/A;    Family History  Problem Relation Age of Onset  . Heart failure Mother   . Diabetes Father   . Diabetes Sister   . Hypertension Sister   . Hypertension Sister   . Hypertension Sister   . Breast cancer Maternal Aunt        2 maternal aunts had breast cancer.  . Breast cancer Maternal Grandmother     Social history:  reports that she has never smoked. She has never used smokeless tobacco. She reports that she does not drink alcohol or use drugs.    Allergies  Allergen Reactions  . Sulfa Antibiotics Itching    Medications:  Prior to Admission medications   Medication Sig Start Date End Date Taking? Authorizing Provider  acetaminophen (TYLENOL) 325 MG tablet Take 2 tablets (650 mg total) by mouth  every 6 (six) hours as needed for mild pain. 04/26/17  Yes Lars Pinks M, PA-C  amoxicillin (AMOXIL) 500 MG tablet Take 500 mg by mouth 2 (two) times daily.   Yes [provider]  anagrelide ([name]) 1 MG capsule Take 1 capsule (1 mg total) by mouth daily. 06/12/17  Yes Truitt Merle, MD  aspirin EC 325 MG EC tablet Take 1 tablet (325 mg total) by mouth daily. 04/27/17  Yes Lars Pinks M, PA-C  atorvastatin (LIPITOR) 40 MG tablet Take 1 tablet (40 mg total) by mouth daily at 6 PM. 05/22/17  Yes Barrett, Rhonda G, PA-C  B Complex Vitamins (B COMPLEX PO) Take 1 tablet by mouth daily.    Yes [provider]    Cholecalciferol (VITAMIN D3) 2000 UNITS capsule Take 2,000 Units by mouth daily.    Yes [provider]  lacosamide ([name]) 200 MG TABS tablet Take 0.5-1 tablets (100-200 mg total) by mouth 2 (two) times daily. 500 mg in the AM and 150 mg at bedtime 04/26/17  Yes Lars Pinks M, PA-C  metoprolol tartrate (LOPRESSOR) 50 MG tablet Take 50 mg by mouth 2 (two) times daily.  07/20/17  Yes [provider]  metoprolol tartrate 75 MG TABS Take 75 mg by mouth 2 (two) times daily. 05/22/17  Yes Barrett, Evelene Croon, PA-C  telmisartan (MICARDIS) 20 MG tablet Take 1 tablet (20 mg total) by mouth daily. 05/22/17  Yes Barrett, Evelene Croon, PA-C  traMADol (ULTRAM) 50 MG tablet Take 50 mg by mouth every 4-6 hours for severe pain. 04/26/17  Yes Lars Pinks M, PA-C  [name] 100 MG TABS  07/07/17  Yes [provider]    ROS:  Out of a complete 14 system review of symptoms, the patient complains only of the following symptoms, and all other reviewed systems are negative.  Seizures  Blood pressure (!) 169/73, pulse 76, height 5\' 2"  (1.575 m), weight 208 lb 8 oz (94.6 kg).  Physical Exam  General: The patient is alert and cooperative at the time of the examination. The patient is moderately obese.  Skin: No significant peripheral edema is noted.   Neurologic Exam  Mental status: The patient is alert and oriented x 3 at the time of the examination. The patient has apparent normal recent and remote memory, with an apparently normal attention span and concentration ability.   Cranial nerves: Facial symmetry is present. Speech is normal, no aphasia or dysarthria is noted. Extraocular movements are full. Visual fields are full.  Motor: The patient has good strength in all 4 extremities.  Sensory examination: Soft touch sensation is symmetric on the face, arms, and legs.  Coordination: The patient has good finger-nose-finger and heel-to-shin bilaterally.  Gait and station: The  patient has a normal gait. Tandem gait is normal. Romberg is negative. No drift is seen.  Reflexes: Deep tendon reflexes are symmetric.   Assessment/Plan:  1. Partial complex seizures  2. Recent myocardial infarction  The patient has done well on [name], I have asked her not to operate a motor vehicle until November 2018. She has not had a recurrent seizure while on [name] at the current dose. The patient was given a prescription for the [name], she will follow-up through this office in 6 months, sooner if needed.  Jill Alexanders MD 07/21/2017 10:04 AM  [facility] [address] [place], [place] [address]  Phone [phone] Fax [phone]

## 2017-07-24 ENCOUNTER — Encounter (HOSPITAL_COMMUNITY)
Admission: RE | Admit: 2017-07-24 | Discharge: 2017-07-24 | Disposition: A | Discharge status: Home / Self Care | Payer: BC Managed Care – PPO | Source: Ambulatory Visit | Attending: Cardiovascular Disease | Admitting: Cardiovascular Disease

## 2017-07-24 DIAGNOSIS — Z951 Presence of aortocoronary bypass graft: Secondary | ICD-10-CM | POA: Insufficient documentation

## 2017-07-24 DIAGNOSIS — Z48812 Encounter for surgical aftercare following surgery on the circulatory system: Secondary | ICD-10-CM | POA: Diagnosis present

## 2017-07-24 DIAGNOSIS — I214 Non-ST elevation (NSTEMI) myocardial infarction: Secondary | ICD-10-CM | POA: Insufficient documentation

## 2017-07-26 ENCOUNTER — Encounter (HOSPITAL_COMMUNITY)
Admission: RE | Admit: 2017-07-26 | Discharge: 2017-07-26 | Disposition: A | Discharge status: Home / Self Care | Payer: BC Managed Care – PPO | Source: Ambulatory Visit | Attending: Cardiovascular Disease | Admitting: Cardiovascular Disease

## 2017-07-26 DIAGNOSIS — I214 Non-ST elevation (NSTEMI) myocardial infarction: Secondary | ICD-10-CM

## 2017-07-26 DIAGNOSIS — Z48812 Encounter for surgical aftercare following surgery on the circulatory system: Secondary | ICD-10-CM | POA: Diagnosis not present

## 2017-07-26 DIAGNOSIS — Z951 Presence of aortocoronary bypass graft: Secondary | ICD-10-CM

## 2017-07-28 ENCOUNTER — Telehealth: Payer: Self-pay | Admitting: Cardiovascular Disease

## 2017-07-28 ENCOUNTER — Encounter (HOSPITAL_COMMUNITY): Admission: RE | Admit: 2017-07-28 | Payer: BC Managed Care – PPO | Source: Ambulatory Visit

## 2017-07-28 NOTE — Telephone Encounter (Signed)
Received signed Attending Physicians Statement (disability) back from Dr Sallyanne Kuster.  Forms sent to [facility] @ [employer] to distribute forms.  Sent via [place] 07/28/17. lp

## 2017-07-31 ENCOUNTER — Encounter (HOSPITAL_COMMUNITY)
Admission: RE | Admit: 2017-07-31 | Discharge: 2017-07-31 | Disposition: A | Discharge status: Home / Self Care | Payer: BC Managed Care – PPO | Source: Ambulatory Visit | Attending: Cardiovascular Disease | Admitting: Cardiovascular Disease

## 2017-07-31 VITALS — Ht 62.0 in | Wt 210.5 lb

## 2017-07-31 DIAGNOSIS — Z951 Presence of aortocoronary bypass graft: Secondary | ICD-10-CM

## 2017-07-31 DIAGNOSIS — Z48812 Encounter for surgical aftercare following surgery on the circulatory system: Secondary | ICD-10-CM | POA: Diagnosis not present

## 2017-07-31 DIAGNOSIS — I214 Non-ST elevation (NSTEMI) myocardial infarction: Secondary | ICD-10-CM

## 2017-07-31 NOTE — Addendum Note (Signed)
Encounter addended by: Dorna Bloom D on: 07/31/2017  4:24 PM<BR>    Actions taken: Visit Navigator Flowsheet section accepted

## 2017-08-02 ENCOUNTER — Encounter (HOSPITAL_COMMUNITY): Payer: Self-pay

## 2017-08-02 ENCOUNTER — Encounter (HOSPITAL_COMMUNITY)
Admission: RE | Admit: 2017-08-02 | Discharge: 2017-08-02 | Disposition: A | Discharge status: Home / Self Care | Payer: BC Managed Care – PPO | Source: Ambulatory Visit | Attending: Cardiovascular Disease | Admitting: Cardiovascular Disease

## 2017-08-02 DIAGNOSIS — Z48812 Encounter for surgical aftercare following surgery on the circulatory system: Secondary | ICD-10-CM | POA: Diagnosis not present

## 2017-08-02 DIAGNOSIS — Z951 Presence of aortocoronary bypass graft: Secondary | ICD-10-CM

## 2017-08-02 DIAGNOSIS — I214 Non-ST elevation (NSTEMI) myocardial infarction: Secondary | ICD-10-CM

## 2017-08-02 NOTE — Progress Notes (Signed)
Cardiac Individual Treatment Plan  Patient Details  Name: Sara [name] MRN: [id number] Date of Birth: [birth year] Referring Provider:     [place] from 06/15/2017 in [place]  Referring Provider  Croitoru, Dani Gobble, MD      Initial Encounter Date:    CARDIAC REHAB PHASE II ORIENTATION from 06/15/2017 in [place]  Date  06/15/17  Referring Provider  Croitoru, Mihai, MD      Visit Diagnosis: 04/21/17 S/P CABG x 2  04/15/17 NSTEMI (non-ST elevated myocardial infarction) [facility])  Patient's Home Medications on Admission:  Current Outpatient Prescriptions:  .  acetaminophen (TYLENOL) 325 MG tablet, Take 2 tablets (650 mg total) by mouth every 6 (six) hours as needed for mild pain., Disp: , Rfl:  .  anagrelide ([name]) 1 MG capsule, Take 1 capsule (1 mg total) by mouth daily., Disp: 30 capsule, Rfl: 5 .  aspirin EC 325 MG EC tablet, Take 1 tablet (325 mg total) by mouth daily., Disp: 30 tablet, Rfl: 0 .  atorvastatin (LIPITOR) 40 MG tablet, Take 1 tablet (40 mg total) by mouth daily at 6 PM., Disp: 30 tablet, Rfl: 11 .  B Complex Vitamins (B COMPLEX PO), Take 1 tablet by mouth daily. , Disp: , Rfl:  .  Cholecalciferol (VITAMIN D3) 2000 UNITS capsule, Take 2,000 Units by mouth daily. , Disp: , Rfl:  .  metoprolol tartrate 75 MG TABS, Take 75 mg by mouth 2 (two) times daily., Disp: 30 tablet, Rfl: 11 .  telmisartan (MICARDIS) 20 MG tablet, Take 1 tablet (20 mg total) by mouth daily., Disp: 30 tablet, Rfl: 11 .  traMADol (ULTRAM) 50 MG tablet, Take 50 mg by mouth every 4-6 hours for severe pain., Disp: 30 tablet, Rfl: 0 .  [name] 100 MG TABS, 1/2 tablet in the morning and 1.5 tablets in the evening, Disp: 180 tablet, Rfl: 3  Past Medical History: Past Medical History:  Diagnosis Date  . Diabetes mellitus   . Gout   . Hyperlipemia   . Hypertension   . LVH (left ventricular hypertrophy)    12/21/09  Echo -mild asymmetric LVH,EF =>55%  . Morbid obesity ([place])   . OSA (obstructive sleep apnea)   . PAD (peripheral artery disease) ([place])   . Renal artery stenosis ([place]) 12/13/2010   PTCA and Stent - right  . Seizures ([place])   . Syncopal episodes   . Thrombocytosis ([facility])     Tobacco Use: History  Smoking Status  . Never Smoker  Smokeless Tobacco  . Never Used    Labs: Recent Review Flowsheet Data    Labs for ITP Cardiac and Pulmonary Rehab Latest Ref Rng & Units 04/21/2017 04/21/2017 04/21/2017 04/22/2017 06/05/2017   Cholestrol 100 - 199 mg/dL - - - - 86(L)   LDLCALC 0 - 99 mg/dL - - - - 39   HDL >39 mg/dL - - - - 37(L)   Trlycerides 0 - 149 mg/dL - - - - 48   Hemoglobin A1c 4.8 - 5.6 % - - - - -   PHART 7.350 - 7.450 7.310(L) - 7.319(L) - -   PCO2ART 32.0 - 48.0 mmHg 47.3 - 45.3 - -   HCO3 20.0 - 28.0 mmol/L 23.9 - 23.4 - -   TCO2 0 - 100 mmol/L '25 24 25 25 '$ -   ACIDBASEDEF 0.0 - 2.0 mmol/L 3.0(H) - 3.0(H) - -   O2SAT % 97.0 - 97.0 - -  Capillary Blood Glucose: Lab Results  Component Value Date   GLUCAP 124 (H) 04/26/2017   GLUCAP 111 (H) 04/26/2017   GLUCAP 123 (H) 04/25/2017   GLUCAP 95 04/25/2017   GLUCAP 119 (H) 04/25/2017     Exercise Target Goals:    Exercise Program Goal: Individual exercise prescription set with THRR, safety & activity barriers. Participant demonstrates ability to understand and report RPE using BORG scale, to self-measure pulse accurately, and to acknowledge the importance of the exercise prescription.  Exercise Prescription Goal: Starting with aerobic activity 30 plus minutes a day, 3 days per week for initial exercise prescription. Provide home exercise prescription and guidelines that participant acknowledges understanding prior to discharge.  Activity Barriers & Risk Stratification:     Activity Barriers & Cardiac Risk Stratification - 06/15/17 1223      Activity Barriers & Cardiac Risk Stratification   Activity Barriers  Deconditioning;Muscular Weakness      6 Minute Walk:     6 Minute Walk    Row Name 06/15/17 1222 07/31/17 1614       6 Minute Walk   Phase Initial Discharge    Distance 1237 feet 1492 feet    Distance % Change  - 20.61 %    Distance Feet Change  - 255 ft    Walk Time 6 minutes 6 minutes    # of Rest Breaks 0 0    MPH 2.34 2.83    METS 2.8 3.17    RPE 9 10    VO2 Peak 9.83 11.11    Symptoms No  -    Resting HR 73 bpm 80 bpm    Resting BP 128/80 134/80    Max Ex. HR 100 bpm 111 bpm    Max Ex. BP 162/80 136/84    2 Minute Post BP 144/82 130/80       Oxygen Initial Assessment:   Oxygen Re-Evaluation:   Oxygen Discharge (Final Oxygen Re-Evaluation):   Initial Exercise Prescription:     Initial Exercise Prescription - 06/15/17 1200      Date of Initial Exercise RX and Referring Provider   Date 06/15/17   Referring Provider Croitoru, Mihai, MD     Treadmill   MPH 2.3   Grade 0   Minutes 10   METs 2.76     Recumbant Bike   Level 2   Minutes 10   METs 2     NuStep   Level 2   Minutes 10   METs 2     Prescription Details   Frequency (times per week) 3   Duration Progress to 30 minutes of continuous aerobic without signs/symptoms of physical distress     Intensity   THRR 40-80% of Max Heartrate 63-126   Ratings of Perceived Exertion 11-13   Perceived Dyspnea 0-4     Progression   Progression Continue to progress workloads to maintain intensity without signs/symptoms of physical distress.     Resistance Training   Training Prescription Yes   Weight 2lbs   Reps 10-15      Perform Capillary Blood Glucose checks as needed.  Exercise Prescription Changes:     Exercise Prescription Changes    Row Name 06/21/17 1600 07/03/17 1700 07/14/17 1701 08/01/17 1600 08/01/17 1700     Response to Exercise   Blood Pressure (Admit) 118/84 138/80 110/60 134/80  -   Blood Pressure (Exercise) 140/80 134/70 132/74 136/80  -   Blood Pressure (Exit) 148/82  128/78 114/80 130/80  -  Heart Rate (Admit) 98 bpm 87 bpm 92 bpm 80 bpm 87 bpm   Heart Rate (Exercise) 113 bpm 111 bpm 116 bpm 115 bpm  -   Heart Rate (Exit) 74 bpm 82 bpm 91 bpm 78 bpm  -   Rating of Perceived Exertion (Exercise) '11 11 12 11  '$ -   Symptoms none none none none  -   Comments pt was oriented to exercise equipment today  -  -  -  -   Duration Continue with 30 min of aerobic exercise without signs/symptoms of physical distress. Continue with 30 min of aerobic exercise without signs/symptoms of physical distress. Continue with 30 min of aerobic exercise without signs/symptoms of physical distress. Continue with 30 min of aerobic exercise without signs/symptoms of physical distress.  -   Intensity THRR unchanged THRR unchanged THRR unchanged THRR unchanged  -     Progression   Progression Continue to progress workloads to maintain intensity without signs/symptoms of physical distress. Continue to progress workloads to maintain intensity without signs/symptoms of physical distress. Continue to progress workloads to maintain intensity without signs/symptoms of physical distress. Continue to progress workloads to maintain intensity without signs/symptoms of physical distress.  -   Average METs  - 2.6 2.9 2.6  -     Resistance Training   Training Prescription No Yes Yes Yes  -   Weight  - 2lbs 2lbs 2lbs  -   Reps  - 10-15 10-15 10-15  -   Time  - 10 Minutes 10 Minutes 10 Minutes  -     Treadmill   MPH 2.3  - 2.5  -  -   Grade 0  - 2  -  -   Minutes 10  - 15  -  -   METs 2.76  - 3.6  -  -     Recumbant Bike   Level  - 2 2.5 2.5  -   Minutes  - '15 15 15  '$ -   METs  - 2.2 2.3 2.3  -     NuStep   Level 2 2  - 4  -   Minutes 10 15  - 15  -   METs  - 3  - 2.9  -     Home Exercise Plan   Plans to continue exercise at  -  - Home (comment)  walking Home (comment)  walking  -   Frequency  -  - Add 2 additional days to program exercise sessions. Add 2 additional days to program  exercise sessions.  -   Initial Home Exercises Provided  -  - 07/10/17 07/10/17  -      Exercise Comments:     Exercise Comments    Row Name 06/21/17 1640 08/01/17 1703         Exercise Comments Pt was oriented to exercise equipment today. Pt responded well to exercise session; will continue to monitor pt's progress/activity levels. Rreviewed METs and goals. Pt is making great progress in cardiac rehab. Pt plans to graduate on 08/04/17 due to returning to work full time.          Exercise Goals and Review:     Exercise Goals    Row Name 06/15/17 0933             Exercise Goals   Increase Physical Activity Yes       Intervention Provide advice, education, support and counseling about physical activity/exercise needs.;Develop an individualized exercise prescription  for aerobic and resistive training based on initial evaluation findings, risk stratification, comorbidities and participant's personal goals.       Expected Outcomes Achievement of increased cardiorespiratory fitness and enhanced flexibility, muscular endurance and strength shown through measurements of functional capacity and personal statement of participant.       Increase Strength and Stamina Yes       Intervention Provide advice, education, support and counseling about physical activity/exercise needs.;Develop an individualized exercise prescription for aerobic and resistive training based on initial evaluation findings, risk stratification, comorbidities and participant's personal goals.       Expected Outcomes Achievement of increased cardiorespiratory fitness and enhanced flexibility, muscular endurance and strength shown through measurements of functional capacity and personal statement of participant.          Exercise Goals Re-Evaluation :     Exercise Goals Re-Evaluation    Row Name 07/04/17 1441 07/12/17 0828 08/01/17 1704         Exercise Goal Re-Evaluation   Exercise Goals Review Increase Physical  Activity;Able to understand and use rate of perceived exertion (RPE) scale;Knowledge and understanding of Target Heart Rate Range (THRR);Understanding of Exercise Prescription;Increase Strength and Stamina Increase Physical Activity;Knowledge and understanding of Target Heart Rate Range (THRR);Able to understand and use rate of perceived exertion (RPE) scale;Understanding of Exercise Prescription;Increase Strength and Stamina;Able to check pulse independently Increase Physical Activity;Knowledge and understanding of Target Heart Rate Range (THRR);Able to understand and use rate of perceived exertion (RPE) scale;Understanding of Exercise Prescription;Increase Strength and Stamina;Able to check pulse independently     Comments Pt is progressing well in cardiac rehab. Pt has increased MET levels on Nustep machine. Reviewed home exercise with pt today.  Pt plans to walk for exercise.  Reviewed THR, pulse, RPE, sign and symptoms, NTG use, and when to call 911 or MD.  Also discussed weather considerations and indoor options.  Pt voiced understanding. Pt is walking for 30 minutes at home without difficulty or signs/symptoms of physical distress.     Expected Outcomes Pt will continue to exercise for 30 minutes continously and improve in cardiorespiratory fitness. Pt will be compliant with HEP and improve in cardiorespiratory fitness Pt will be compliant with HEP and improve in cardiorespiratory fitness         Discharge Exercise Prescription (Final Exercise Prescription Changes):     Exercise Prescription Changes - 08/01/17 1700      Response to Exercise   Heart Rate (Admit) 87 bpm      Nutrition:  Target Goals: Understanding of nutrition guidelines, daily intake of sodium '1500mg'$ , cholesterol '200mg'$ , calories 30% from fat and 7% or less from saturated fats, daily to have 5 or more servings of fruits and vegetables.  Biometrics:     Pre Biometrics - 07/31/17 1624      Pre Biometrics   % Body Fat  48.9 %         Post Biometrics - 07/31/17 1624       Post  Biometrics   % Body Fat 47.7 %      Nutrition Therapy Plan and Nutrition Goals:     Nutrition Therapy & Goals - 06/15/17 1232      Nutrition Therapy   Diet Carb Modified, Therapeutic Lifestyle Changes     Personal Nutrition Goals   Nutrition Goal Pt to identify food quantities necessary to achieve weight loss of 6-24 lb (2.7-10.9 kg) at graduation from cardiac rehab. Goal wt of < 200 lb desired.  Intervention Plan   Intervention Prescribe, educate and counsel regarding individualized specific dietary modifications aiming towards targeted core components such as weight, hypertension, lipid management, diabetes, heart failure and other comorbidities.   Expected Outcomes Short Term Goal: Understand basic principles of dietary content, such as calories, fat, sodium, cholesterol and nutrients.;Long Term Goal: Adherence to prescribed nutrition plan.      Nutrition Discharge: Nutrition Scores:     Nutrition Assessments - 06/15/17 1227      MEDFICTS Scores   Pre Score 18      Nutrition Goals Re-Evaluation:   Nutrition Goals Re-Evaluation:   Nutrition Goals Discharge (Final Nutrition Goals Re-Evaluation):   Psychosocial: Target Goals: Acknowledge presence or absence of significant depression and/or stress, maximize coping skills, provide positive support system. Participant is able to verbalize types and ability to use techniques and skills needed for reducing stress and depression.  Initial Review & Psychosocial Screening:     Initial Psych Review & Screening - 06/21/17 1705      Family Dynamics   Comments pt adult son diagnosed with paranoid schizophrenia, symptoms not well managed.  son does live in her home with psychiatric care provided by ACT team.  pt exhibits caregiver strain and fatigue.       Quality of Life Scores:     Quality of Life - 06/15/17 1239      Quality of Life Scores    Health/Function Pre 22.87 %   Socioeconomic Pre 22.25 %   Psych/Spiritual Pre 27.21 %   Family Pre 24 %   GLOBAL Pre 24.19 %      PHQ-9: Recent Review Flowsheet Data    Depression screen [facility] 2/9 08/02/2017 06/21/2017   Decreased Interest 0 0   Down, Depressed, Hopeless 0 0   PHQ - 2 Score 0 0     Interpretation of Total Score  Total Score Depression Severity:  1-4 = Minimal depression, 5-9 = Mild depression, 10-14 = Moderate depression, 15-19 = Moderately severe depression, 20-27 = Severe depression   Psychosocial Evaluation and Intervention:     Psychosocial Evaluation - 08/01/17 1354      Discharge Psychosocial Assessment & Intervention   Comments pt family situational stress improving.  pt demonstrates improved outlook with good coping skills. pt has returned to work without difficulty.      Psychosocial Re-Evaluation:     Psychosocial Re-Evaluation    Row Name 07/05/17 1702 08/02/17 1617           Psychosocial Re-Evaluation   Current issues with None Identified None Identified      Comments no psychosocial needs identified, no interventions necessary  no psychosocial needs identified, no interventions necessary       Expected Outcomes pt will exhibit positive outlook with good coping skills.  pt will exhibit positive outlook with good coping skills.       Interventions Encouraged to attend Cardiac Rehabilitation for the exercise;Stress management education;Relaxation education Encouraged to attend Cardiac Rehabilitation for the exercise;Stress management education;Relaxation education      Continue Psychosocial Services  No Follow up required No Follow up required         Psychosocial Discharge (Final Psychosocial Re-Evaluation):     Psychosocial Re-Evaluation - 08/02/17 1617      Psychosocial Re-Evaluation   Current issues with None Identified   Comments no psychosocial needs identified, no interventions necessary    Expected Outcomes pt will exhibit  positive outlook with good coping skills.    Interventions Encouraged to attend  Cardiac Rehabilitation for the exercise;Stress management education;Relaxation education   Continue Psychosocial Services  No Follow up required      Vocational Rehabilitation: Provide vocational rehab assistance to qualifying candidates.   Vocational Rehab Evaluation & Intervention:     Vocational Rehab - 06/21/17 1704      Initial Vocational Rehab Evaluation & Intervention   Assessment shows need for Vocational Rehabilitation No      Education: Education Goals: Education classes will be provided on a weekly basis, covering required topics. Participant will state understanding/return demonstration of topics presented.  Learning Barriers/Preferences:     Learning Barriers/Preferences - 06/15/17 9147      Learning Barriers/Preferences   Learning Barriers Sight   Learning Preferences Skilled Demonstration      Education Topics: Count Your Pulse:  -Group instruction provided by verbal instruction, demonstration, patient participation and written materials to support subject.  Instructors address importance of being able to find your pulse and how to count your pulse when at home without a heart monitor.  Patients get hands on experience counting their pulse with staff help and individually.   Heart Attack, Angina, and Risk Factor Modification:  -Group instruction provided by verbal instruction, video, and written materials to support subject.  Instructors address signs and symptoms of angina and heart attacks.    Also discuss risk factors for heart disease and how to make changes to improve heart health risk factors.   Functional Fitness:  -Group instruction provided by verbal instruction, demonstration, patient participation, and written materials to support subject.  Instructors address safety measures for doing things around the house.  Discuss how to get up and down off the floor, how to pick  things up properly, how to safely get out of a chair without assistance, and balance training.   Meditation and Mindfulness:  -Group instruction provided by verbal instruction, patient participation, and written materials to support subject.  Instructor addresses importance of mindfulness and meditation practice to help reduce stress and improve awareness.  Instructor also leads participants through a meditation exercise.    Stretching for Flexibility and Mobility:  -Group instruction provided by verbal instruction, patient participation, and written materials to support subject.  Instructors lead participants through series of stretches that are designed to increase flexibility thus improving mobility.  These stretches are additional exercise for major muscle groups that are typically performed during regular warm up and cool down.   Hands Only CPR:  -Group verbal, video, and participation provides a basic overview of AHA guidelines for community CPR. Role-play of emergencies allow participants the opportunity to practice calling for help and chest compression technique with discussion of AED use.   Hypertension: -Group verbal and written instruction that provides a basic overview of hypertension including the most recent diagnostic guidelines, risk factor reduction with self-care instructions and medication management.    Nutrition I class: Heart Healthy Eating:  -Group instruction provided by PowerPoint slides, verbal discussion, and written materials to support subject matter. The instructor gives an explanation and review of the Therapeutic Lifestyle Changes diet recommendations, which includes a discussion on lipid goals, dietary fat, sodium, fiber, plant stanol/sterol esters, sugar, and the components of a well-balanced, healthy diet.   CARDIAC REHAB PHASE II EXERCISE from 07/19/2017 in [place]  Date  07/19/17  Educator  RD  Instruction Review Code   Not applicable      Nutrition II class: Lifestyle Skills:  -Group instruction provided by PowerPoint slides, verbal discussion, and written  materials to support subject matter. The instructor gives an explanation and review of label reading, grocery shopping for heart health, heart healthy recipe modifications, and ways to make healthier choices when eating out.   CARDIAC REHAB PHASE II EXERCISE from 07/19/2017 in [place]  Date  07/19/17  Educator  RD  Instruction Review Code  Not applicable      Diabetes Question & Answer:  -Group instruction provided by PowerPoint slides, verbal discussion, and written materials to support subject matter. The instructor gives an explanation and review of diabetes co-morbidities, pre- and post-prandial blood glucose goals, pre-exercise blood glucose goals, signs, symptoms, and treatment of hypoglycemia and hyperglycemia, and foot care basics.   Diabetes Blitz:  -Group instruction provided by PowerPoint slides, verbal discussion, and written materials to support subject matter. The instructor gives an explanation and review of the physiology behind type 1 and type 2 diabetes, diabetes medications and rational behind using different medications, pre- and post-prandial blood glucose recommendations and Hemoglobin A1c goals, diabetes diet, and exercise including blood glucose guidelines for exercising safely.    Portion Distortion:  -Group instruction provided by PowerPoint slides, verbal discussion, written materials, and food models to support subject matter. The instructor gives an explanation of serving size versus portion size, changes in portions sizes over the last 20 years, and what consists of a serving from each food group.   Stress Management:  -Group instruction provided by verbal instruction, video, and written materials to support subject matter.  Instructors review role of stress in heart disease and how to cope  with stress positively.     Exercising on Your Own:  -Group instruction provided by verbal instruction, power point, and written materials to support subject.  Instructors discuss benefits of exercise, components of exercise, frequency and intensity of exercise, and end points for exercise.  Also discuss use of nitroglycerin and activating EMS.  Review options of places to exercise outside of rehab.  Review guidelines for sex with heart disease.   Cardiac Drugs I:  -Group instruction provided by verbal instruction and written materials to support subject.  Instructor reviews cardiac drug classes: antiplatelets, anticoagulants, beta blockers, and statins.  Instructor discusses reasons, side effects, and lifestyle considerations for each drug class.   Cardiac Drugs II:  -Group instruction provided by verbal instruction and written materials to support subject.  Instructor reviews cardiac drug classes: angiotensin converting enzyme inhibitors (ACE-I), angiotensin II receptor blockers (ARBs), nitrates, and calcium channel blockers.  Instructor discusses reasons, side effects, and lifestyle considerations for each drug class.   Anatomy and Physiology of the Circulatory System:  Group verbal and written instruction and models provide basic cardiac anatomy and physiology, with the coronary electrical and arterial systems. Review of: AMI, Angina, Valve disease, Heart Failure, Peripheral Artery Disease, Cardiac Arrhythmia, Pacemakers, and the ICD.   Other Education:  -Group or individual verbal, written, or video instructions that support the educational goals of the cardiac rehab program.   Knowledge Questionnaire Score:     Knowledge Questionnaire Score - 06/15/17 1222      Knowledge Questionnaire Score   Pre Score 18/24      Core Components/Risk Factors/Patient Goals at Admission:     Personal Goals and Risk Factors at Admission - 06/15/17 1245      Core Components/Risk  Factors/Patient Goals on Admission   Hypertension Yes   Intervention Provide education on lifestyle modifcations including regular physical activity/exercise, weight management, moderate sodium restriction and increased consumption of  fresh fruit, vegetables, and low fat dairy, alcohol moderation, and smoking cessation.;Monitor prescription use compliance.   Expected Outcomes Short Term: Continued assessment and intervention until BP is < 140/19m HG in hypertensive participants. < 130/859mHG in hypertensive participants with diabetes, heart failure or chronic kidney disease.;Long Term: Maintenance of blood pressure at goal levels.   Lipids Yes   Intervention Provide education and support for participant on nutrition & aerobic/resistive exercise along with prescribed medications to achieve LDL '70mg'$ , HDL >'40mg'$ .   Expected Outcomes Short Term: Participant states understanding of desired cholesterol values and is compliant with medications prescribed. Participant is following exercise prescription and nutrition guidelines.;Long Term: Cholesterol controlled with medications as prescribed, with individualized exercise RX and with personalized nutrition plan. Value goals: LDL < '70mg'$ , HDL > 40 mg.   Stress Yes   Intervention Offer individual and/or small group education and counseling on adjustment to heart disease, stress management and health-related lifestyle change. Teach and support self-help strategies.;Refer participants experiencing significant psychosocial distress to appropriate mental health specialists for further evaluation and treatment. When possible, include family members and significant others in education/counseling sessions.   Expected Outcomes Long Term: Emotional wellbeing is indicated by absence of clinically significant psychosocial distress or social isolation.;Short Term: Participant demonstrates changes in health-related behavior, relaxation and other stress management skills, ability  to obtain effective social support, and compliance with psychotropic medications if prescribed.   Intervention Provide exercise programming to assist with improving confidence and cardiovascular fitness. Provide nutrition couseling on HH diet and for weight loss/weight management      Core Components/Risk Factors/Patient Goals Review:      Goals and Risk Factor Review    Row Name 07/05/17 1700 08/01/17 1354           Core Components/Risk Factors/Patient Goals Review   Personal Goals Review Weight Management/Obesity;Hypertension;Lipids;Stress;Other Weight Management/Obesity;Hypertension;Lipids;Stress;Other      Review pt with multiple CAD RF demonstrates eagerness to participate in CR exercise, nutrition and education opportunities.  pt with multiple CAD RF demonstrates eagerness to participate in CR exercise, nutrition and education opportunities.       Expected Outcomes pt will participate in CR exercise, nutrition and lifestyle modiification to decrease overall RF.  pt will participate in CR exercise, nutrition and lifestyle modiification to decrease overall RF.          Core Components/Risk Factors/Patient Goals at Discharge (Final Review):      Goals and Risk Factor Review - 08/01/17 1354      Core Components/Risk Factors/Patient Goals Review   Personal Goals Review Weight Management/Obesity;Hypertension;Lipids;Stress;Other   Review pt with multiple CAD RF demonstrates eagerness to participate in CR exercise, nutrition and education opportunities.    Expected Outcomes pt will participate in CR exercise, nutrition and lifestyle modiification to decrease overall RF.       ITP Comments:     ITP Comments    Row Name 06/15/17 [id number]/12/[id number] 08/02/17 1533 08/02/17 1616     ITP Comments Medical Director, Dr. TrFransico Himedical Director, Dr. TrFransico Him 30 day ITP review. pt with good attendance and participation. pt plans to exit program early due to returning to work.         Comments:

## 2017-08-04 ENCOUNTER — Telehealth (HOSPITAL_COMMUNITY): Payer: Self-pay | Admitting: Internal Medicine

## 2017-08-04 ENCOUNTER — Encounter (HOSPITAL_COMMUNITY): Payer: BC Managed Care – PPO

## 2017-08-07 ENCOUNTER — Encounter (HOSPITAL_COMMUNITY): Admission: RE | Admit: 2017-08-07 | Payer: BC Managed Care – PPO | Source: Ambulatory Visit

## 2017-08-07 NOTE — Telephone Encounter (Signed)
Close Encounter 

## 2017-08-09 ENCOUNTER — Encounter (HOSPITAL_COMMUNITY): Payer: BC Managed Care – PPO

## 2017-08-11 ENCOUNTER — Other Ambulatory Visit (HOSPITAL_BASED_OUTPATIENT_CLINIC_OR_DEPARTMENT_OTHER): Payer: BC Managed Care – PPO

## 2017-08-11 ENCOUNTER — Encounter (HOSPITAL_COMMUNITY): Payer: BC Managed Care – PPO

## 2017-08-11 DIAGNOSIS — D473 Essential (hemorrhagic) thrombocythemia: Secondary | ICD-10-CM

## 2017-08-11 LAB — COMPREHENSIVE METABOLIC PANEL
ALT: 26 U/L (ref 0–55)
ANION GAP: 9 meq/L (ref 3–11)
AST: 27 U/L (ref 5–34)
Albumin: 3.6 g/dL (ref 3.5–5.0)
Alkaline Phosphatase: 88 U/L (ref 40–150)
BILIRUBIN TOTAL: 0.37 mg/dL (ref 0.20–1.20)
BUN: 15 mg/dL (ref 7.0–26.0)
CHLORIDE: 103 meq/L (ref 98–109)
CO2: 27 meq/L (ref 22–29)
CREATININE: 1.1 mg/dL (ref 0.6–1.1)
Calcium: 9.2 mg/dL (ref 8.4–10.4)
EGFR: >60 mL/min/{1.73_m2} (ref >60)
Glucose: 93 mg/dl (ref 70–140)
Potassium: 4.2 mEq/L (ref 3.5–5.1)
Sodium: 139 mEq/L (ref 136–145)
TOTAL PROTEIN: 7 g/dL (ref 6.4–8.3)

## 2017-08-11 LAB — CBC WITH DIFFERENTIAL/PLATELET
BASO%: 0.9 % (ref 0.0–2.0)
Basophils Absolute: 0.1 10*3/uL (ref 0.0–0.1)
EOS%: 5.2 % (ref 0.0–7.0)
Eosinophils Absolute: 0.4 10*3/uL (ref 0.0–0.5)
HCT: 40 % (ref 34.8–46.6)
HGB: 13 g/dL (ref 11.6–15.9)
LYMPH#: 1.9 10*3/uL (ref 0.9–3.3)
LYMPH%: 27.4 % (ref 14.0–49.7)
MCH: 27.8 pg (ref 25.1–34.0)
MCHC: 32.6 g/dL (ref 31.5–36.0)
MCV: 85.4 fL (ref 79.5–101.0)
MONO#: 0.5 10*3/uL (ref 0.1–0.9)
MONO%: 8 % (ref 0.0–14.0)
NEUT%: 58.5 % (ref 38.4–76.8)
NEUTROS ABS: 4 10*3/uL (ref 1.5–6.5)
PLATELETS: 369 10*3/uL (ref 145–400)
RBC: 4.68 10*6/uL (ref 3.70–5.45)
RDW: 15.7 % — ABNORMAL HIGH (ref 11.2–14.5)
WBC: 6.9 10*3/uL (ref 3.9–10.3)

## 2017-08-14 ENCOUNTER — Telehealth: Payer: Self-pay | Admitting: Nurse Practitioner

## 2017-08-14 ENCOUNTER — Encounter (HOSPITAL_COMMUNITY): Payer: BC Managed Care – PPO

## 2017-08-14 NOTE — Addendum Note (Signed)
Encounter addended by: Lowell Guitar, RN on: 08/14/2017  3:40 PM<BR>    Actions taken: Sign clinical note, Episode resolved

## 2017-08-14 NOTE — Progress Notes (Signed)
Discharge Progress Report  Patient Details  Name: Sara [name] MRN: [id number] Date of Birth: [birth year] Referring Provider:     CARDIAC REHAB PHASE II ORIENTATION from 06/15/2017 in [place]  Referring Provider  Croitoru, Dani Gobble, MD       Number of Visits: 14   Reason for Discharge:  Early Exit:  Back to work.  Pt plans to continue exercising on her own walking.   Smoking History:  History  Smoking Status  . Never Smoker  Smokeless Tobacco  . Never Used    Diagnosis:  04/21/17 S/P CABG x 2  04/15/17 NSTEMI (non-ST elevated myocardial infarction) [facility])  ADL UCSD:   Initial Exercise Prescription:     Initial Exercise Prescription - 06/15/17 1200      Date of Initial Exercise RX and Referring Provider   Date 06/15/17   Referring Provider Croitoru, Mihai, MD     Treadmill   MPH 2.3   Grade 0   Minutes 10   METs 2.76     Recumbant Bike   Level 2   Minutes 10   METs 2     NuStep   Level 2   Minutes 10   METs 2     Prescription Details   Frequency (times per week) 3   Duration Progress to 30 minutes of continuous aerobic without signs/symptoms of physical distress     Intensity   THRR 40-80% of Max Heartrate 63-126   Ratings of Perceived Exertion 11-13   Perceived Dyspnea 0-4     Progression   Progression Continue to progress workloads to maintain intensity without signs/symptoms of physical distress.     Resistance Training   Training Prescription Yes   Weight 2lbs   Reps 10-15      Discharge Exercise Prescription (Final Exercise Prescription Changes):     Exercise Prescription Changes - 08/02/17 1438      Response to Exercise   Blood Pressure (Admit) 102/72   Blood Pressure (Exercise) 138/78   Blood Pressure (Exit) 132/82   Heart Rate (Admit) 94 bpm   Heart Rate (Exercise) 123 bpm   Heart Rate (Exit) 94 bpm   Rating of Perceived Exertion (Exercise) 11   Symptoms none   Duration Continue with 30  min of aerobic exercise without signs/symptoms of physical distress.   Intensity THRR unchanged     Progression   Progression Continue to progress workloads to maintain intensity without signs/symptoms of physical distress.   Average METs 3.2     Resistance Training   Training Prescription No     Treadmill   MPH 2.5   Grade 2   Minutes 15   METs 3.6     Recumbant Bike   Level 2.5   Minutes 15   METs 2.8     Home Exercise Plan   Plans to continue exercise at Home (comment)  walking   Frequency Add 2 additional days to program exercise sessions.   Initial Home Exercises Provided 07/10/17      Functional Capacity:     6 Minute Walk    Row Name 06/15/17 1222 07/31/17 1614       6 Minute Walk   Phase Initial Discharge    Distance 1237 feet 1492 feet    Distance % Change  - 20.61 %    Distance Feet Change  - 255 ft    Walk Time 6 minutes 6 minutes    # of Rest Breaks 0 0  MPH 2.34 2.83    METS 2.8 3.17    RPE 9 10    VO2 Peak 9.83 11.11    Symptoms No  -    Resting HR 73 bpm 80 bpm    Resting BP 128/80 134/80    Max Ex. HR 100 bpm 111 bpm    Max Ex. BP 162/80 136/84    2 Minute Post BP 144/82 130/80       Psychological, QOL, Others - Outcomes: PHQ 2/9: Depression screen [facility] 2/9 08/02/2017 06/21/2017  Decreased Interest 0 0  Down, Depressed, Hopeless 0 0  PHQ - 2 Score 0 0  Some recent data might be hidden    Quality of Life:     Quality of Life - 08/03/17 1424      Quality of Life Scores   Health/Function Pre 22.87 %   Health/Function Post 21.83 %   Health/Function % Change -4.55 %   Socioeconomic Pre 22.25 %   Socioeconomic Post 20.75 %   Socioeconomic % Change  -6.74 %   Psych/Spiritual Pre 27.21 %   Psych/Spiritual Post 19.5 %   Psych/Spiritual % Change -28.34 %   Family Pre 24 %   Family Post 22.8 %   Family % Change -5 %   GLOBAL Pre 24.19 %   GLOBAL Post 21.26 %   GLOBAL % Change -12.11 %      Personal Goals: Goals established  at orientation with interventions provided to work toward goal.     Personal Goals and Risk Factors at Admission - 06/15/17 1245      Core Components/Risk Factors/Patient Goals on Admission   Hypertension Yes   Intervention Provide education on lifestyle modifcations including regular physical activity/exercise, weight management, moderate sodium restriction and increased consumption of fresh fruit, vegetables, and low fat dairy, alcohol moderation, and smoking cessation.;Monitor prescription use compliance.   Expected Outcomes Short Term: Continued assessment and intervention until BP is < 140/70mm HG in hypertensive participants. < 130/69mm HG in hypertensive participants with diabetes, heart failure or chronic kidney disease.;Long Term: Maintenance of blood pressure at goal levels.   Lipids Yes   Intervention Provide education and support for participant on nutrition & aerobic/resistive exercise along with prescribed medications to achieve LDL 70mg , HDL >40mg .   Expected Outcomes Short Term: Participant states understanding of desired cholesterol values and is compliant with medications prescribed. Participant is following exercise prescription and nutrition guidelines.;Long Term: Cholesterol controlled with medications as prescribed, with individualized exercise RX and with personalized nutrition plan. Value goals: LDL < 70mg , HDL > 40 mg.   Stress Yes   Intervention Offer individual and/or small group education and counseling on adjustment to heart disease, stress management and health-related lifestyle change. Teach and support self-help strategies.;Refer participants experiencing significant psychosocial distress to appropriate mental health specialists for further evaluation and treatment. When possible, include family members and significant others in education/counseling sessions.   Expected Outcomes Long Term: Emotional wellbeing is indicated by absence of clinically significant  psychosocial distress or social isolation.;Short Term: Participant demonstrates changes in health-related behavior, relaxation and other stress management skills, ability to obtain effective social support, and compliance with psychotropic medications if prescribed.   Intervention Provide exercise programming to assist with improving confidence and cardiovascular fitness. Provide nutrition couseling on [place] and for weight loss/weight management       Personal Goals Discharge:     Goals and Risk Factor Review    Row Name 07/05/17 1700 08/01/17 1354 08/02/17 1744  Core Components/Risk Factors/Patient Goals Review   Personal Goals Review Weight Management/Obesity;Hypertension;Lipids;Stress;Other Weight Management/Obesity;Hypertension;Lipids;Stress;Other Weight Management/Obesity;Hypertension;Lipids;Stress;Other     Review pt with multiple CAD RF demonstrates eagerness to participate in CR exercise, nutrition and education opportunities.  pt with multiple CAD RF demonstrates eagerness to participate in CR exercise, nutrition and education opportunities.  pt feels she has increased strength/stamina, maintained weight with losing inches.  pt plans to continue exercising on her own walking. pt given information about [facility].     Expected Outcomes pt will participate in CR exercise, nutrition and lifestyle modiification to decrease overall RF.  pt will participate in CR exercise, nutrition and lifestyle modiification to decrease overall RF.  pt will continue exercise, nutrition and lifestyle modification activities to decrease overall RF.        Exercise Goals and Review:     Exercise Goals    Row Name 06/15/17 0933             Exercise Goals   Increase Physical Activity Yes       Intervention Provide advice, education, support and counseling about physical activity/exercise needs.;Develop an individualized exercise prescription for aerobic and resistive training based on  initial evaluation findings, risk stratification, comorbidities and participant's personal goals.       Expected Outcomes Achievement of increased cardiorespiratory fitness and enhanced flexibility, muscular endurance and strength shown through measurements of functional capacity and personal statement of participant.       Increase Strength and Stamina Yes       Intervention Provide advice, education, support and counseling about physical activity/exercise needs.;Develop an individualized exercise prescription for aerobic and resistive training based on initial evaluation findings, risk stratification, comorbidities and participant's personal goals.       Expected Outcomes Achievement of increased cardiorespiratory fitness and enhanced flexibility, muscular endurance and strength shown through measurements of functional capacity and personal statement of participant.          Nutrition & Weight - Outcomes:     Pre Biometrics - 07/31/17 1624      Pre Biometrics   % Body Fat 48.9 %         Post Biometrics - 07/31/17 1624       Post  Biometrics   % Body Fat 47.7 %      Nutrition:     Nutrition Therapy & Goals - 06/15/17 1232      Nutrition Therapy   Diet Carb Modified, Therapeutic Lifestyle Changes     Personal Nutrition Goals   Nutrition Goal Pt to identify food quantities necessary to achieve weight loss of 6-24 lb (2.7-10.9 kg) at graduation from cardiac rehab. Goal wt of < 200 lb desired.      Intervention Plan   Intervention Prescribe, educate and counsel regarding individualized specific dietary modifications aiming towards targeted core components such as weight, hypertension, lipid management, diabetes, heart failure and other comorbidities.   Expected Outcomes Short Term Goal: Understand basic principles of dietary content, such as calories, fat, sodium, cholesterol and nutrients.;Long Term Goal: Adherence to prescribed nutrition plan.      Nutrition Discharge:      Nutrition Assessments - 08/11/17 0942      MEDFICTS Scores   Pre Score 18   Post Score 74   Score Difference 56      Education Questionnaire Score:     Knowledge Questionnaire Score - 08/03/17 1424      Knowledge Questionnaire Score   Post Score 21/24  Goals reviewed with patient; copy given to patient.

## 2017-08-14 NOTE — Telephone Encounter (Signed)
Informed patient labs are stable and to continue medication regimen as prescribed. She will return for lab and MD f/u 10/11/17, reviewed times and date with her. She verbalizes understanding and thanks for the call.

## 2017-08-16 ENCOUNTER — Encounter (HOSPITAL_COMMUNITY): Payer: BC Managed Care – PPO

## 2017-08-18 ENCOUNTER — Encounter (HOSPITAL_COMMUNITY): Payer: BC Managed Care – PPO

## 2017-08-21 ENCOUNTER — Encounter: Payer: Self-pay | Admitting: Cardiovascular Disease

## 2017-08-21 ENCOUNTER — Ambulatory Visit (INDEPENDENT_AMBULATORY_CARE_PROVIDER_SITE_OTHER): Payer: BC Managed Care – PPO | Admitting: Cardiovascular Disease

## 2017-08-21 ENCOUNTER — Encounter (HOSPITAL_COMMUNITY): Payer: BC Managed Care – PPO

## 2017-08-21 VITALS — BP 159/84 | HR 62 | Ht 62.0 in | Wt 206.0 lb

## 2017-08-21 DIAGNOSIS — I5032 Chronic diastolic (congestive) heart failure: Secondary | ICD-10-CM | POA: Diagnosis not present

## 2017-08-21 DIAGNOSIS — E7800 Pure hypercholesterolemia, unspecified: Secondary | ICD-10-CM | POA: Diagnosis not present

## 2017-08-21 DIAGNOSIS — G4733 Obstructive sleep apnea (adult) (pediatric): Secondary | ICD-10-CM | POA: Diagnosis not present

## 2017-08-21 DIAGNOSIS — I701 Atherosclerosis of renal artery: Secondary | ICD-10-CM

## 2017-08-21 DIAGNOSIS — I10 Essential (primary) hypertension: Secondary | ICD-10-CM | POA: Diagnosis not present

## 2017-08-21 NOTE — Progress Notes (Signed)
Cardiology Office Note    Date:  08/22/2017   ID:  Sara [name], DOB [birth year], MRN [id number]  PCP:  [name], MD  Cardiologist:   Sanda Klein, MD   Chief Complaint  Patient presents with  . Follow-up    History of Present Illness:  Sara [name] is a 61 y.o. female with history of artery disease (NSTEMI April 15, 2017, LIMA to LAD and SVG to diagonal bypass surgery on April 21, 2017).  Mild ischemic cardiomyopathy (EF 45-50%), peripheral arterial disease (renal artery stenosis with stent 2012), morbid obesity, diabetes mellitus, hyperlipidemia, hypertension, obstructive sleep apnea on CPAP, essential thrombocythemia and absence seizures, returning for routine follow-up.  He is back at work and generally feeling great.  She misses participation in cardiac rehab and is interested in going to phase 4, if she can reconcile this with her work schedule.  She has some stinging and itching at the sternal scar which has some keloid formation.  Denies angina, dyspnea, leg edema, palpitations, syncope, claudication, hypersomnolence.  She is compliant with CPAP and has good glycemic control.  Her blood pressure is a little high.  She remains moderate to severely obese.    Past Medical History:  Diagnosis Date  . Diabetes mellitus   . Gout   . Hyperlipemia   . Hypertension   . LVH (left ventricular hypertrophy)    12/21/09 Echo -mild asymmetric LVH,EF =>55%  . Morbid obesity ([place])   . OSA (obstructive sleep apnea)   . PAD (peripheral artery disease) ([place])   . Renal artery stenosis ([place]) 12/13/2010   PTCA and Stent - right  . Seizures ([place])   . Syncopal episodes   . Thrombocytosis ([place])     Past Surgical History:  Procedure Laterality Date  . BREAST CYST EXCISION     S/P Benign Right Breast Cyst Removal.  . BREAST REDUCTION SURGERY  1995   S/P Bilateral breast reduction  . [place]   Times  Two.  . CORONARY ARTERY BYPASS GRAFT N/A 04/21/2017   Procedure: CORONARY ARTERY BYPASS GRAFTING (CABG) x 2, USING LEFT MAMMARY ARTERY AND RIGHT GREATER SAPHENOUS VEIN HARVESTED ENDOSCOPICALLY;  Surgeon: [name], MD;  Location: [place];  Service: Open Heart Surgery;  Laterality: N/A;  . EXTREMITY CYST EXCISION  1993   left wrist  . LEFT HEART CATH AND CORONARY ANGIOGRAPHY N/A 04/17/2017   Procedure: Left Heart Cath and Coronary Angiography;  Surgeon: Jettie Booze, MD;  Location: [place] [facility];  Service: Cardiovascular;  Laterality: N/A;  . PARTIAL HYSTERECTOMY  1994  . renal artery stent placement Right   . TEE WITHOUT CARDIOVERSION N/A 04/21/2017   Procedure: TRANSESOPHAGEAL ECHOCARDIOGRAM (TEE);  Surgeon: [name], MD;  Location: [place];  Service: Open Heart Surgery;  Laterality: N/A;    Current Medications: Outpatient Medications Prior to Visit  Medication Sig Dispense Refill  . acetaminophen (TYLENOL) 325 MG tablet Take 2 tablets (650 mg total) by mouth every 6 (six) hours as needed for mild pain.    Marland Kitchen anagrelide ([name]) 1 MG capsule Take 1 capsule (1 mg total) by mouth daily. 30 capsule 5  . aspirin EC 325 MG EC tablet Take 1 tablet (325 mg total) by mouth daily. 30 tablet 0  . atorvastatin (LIPITOR) 40 MG tablet Take 1 tablet (40 mg total) by mouth daily at 6 PM. 30 tablet 11  . B Complex Vitamins (B COMPLEX PO) Take 1 tablet by mouth  daily.     . Cholecalciferol (VITAMIN D3) 2000 UNITS capsule Take 2,000 Units by mouth daily.     . metoprolol tartrate 75 MG TABS Take 75 mg by mouth 2 (two) times daily. 30 tablet 11  . telmisartan (MICARDIS) 20 MG tablet Take 1 tablet (20 mg total) by mouth daily. 30 tablet 11  . [name] 100 MG TABS 1/2 tablet in the morning and 1.5 tablets in the evening 180 tablet 3  . traMADol (ULTRAM) 50 MG tablet Take 50 mg by mouth every 4-6 hours for severe pain. (Patient not taking: Reported on 08/21/2017) 30 tablet 0   No facility-administered medications prior to visit.      Allergies:    Sulfa antibiotics   Social History   Social History  . Marital status: Married    Spouse name: Juanda Crumble  . Number of children: 2  . Years of education: 16   Occupational History  . [employer]. [place] History Main Topics  . Smoking status: Never Smoker  . Smokeless tobacco: Never Used  . Alcohol use No  . Drug use: No  . Sexual activity: Yes   Other Topics Concern  . None   Social History Narrative   Patient lives at husband and her son, home with family.   Patient has two adult children.   Patient is not drinking any caffeine.   Patient is working full-time.   Patient has a college education.   Patient is right-handed.     Family History:  The patient's family history includes Breast cancer in her maternal aunt and maternal grandmother; Diabetes in her father and sister; Heart failure in her mother; Hypertension in her sister, sister, and sister.   ROS:   Please see the history of present illness.    ROS All other systems reviewed and are negative.   PHYSICAL EXAM:   VS:  BP (!) 159/84   Pulse 62   Ht 5\' 2"  (1.575 m)   Wt 206 lb (93.4 kg)   BMI 37.68 kg/m     General: Alert, oriented x3, no distress, severely obese Head: no evidence of trauma, PERRL, EOMI, no exophtalmos or lid lag, no myxedema, no xanthelasma; normal ears, nose and oropharynx Neck: normal jugular venous pulsations and no hepatojugular reflux; brisk carotid pulses without delay and no carotid bruits Chest: clear to auscultation, no signs of consolidation by percussion or palpation, normal fremitus, symmetrical and full respiratory excursions.  Keloid scar formation at the sternotomy site Cardiovascular: normal position and quality of the apical impulse, regular rhythm, normal first and second heart sounds, no murmurs, rubs or gallops Abdomen: no tenderness or distention, no masses by palpation, no abnormal pulsatility or arterial bruits, normal bowel sounds, no  hepatosplenomegaly Extremities: no clubbing, cyanosis or edema; 2+ radial, ulnar and brachial pulses bilaterally; 2+ right femoral, posterior tibial and dorsalis pedis pulses; 2+ left femoral, posterior tibial and dorsalis pedis pulses; no subclavian or femoral bruits Neurological: grossly nonfocal Psych: Normal mood and affect   Wt Readings from Last 3 Encounters:  08/21/17 206 lb (93.4 kg)  07/31/17 210 lb 8.6 oz (95.5 kg)  07/21/17 208 lb 8 oz (94.6 kg)      Studies/Labs Reviewed:   EKG:  EKG is not ordered today.  The ekg ordered 08/05/16 demonstrates sinus rhythm and is a normal tracing  Recent Labs: 04/22/2017: Magnesium 2.3 08/11/2017: ALT 26; BUN 15.0; Creatinine 1.1; HGB 13.0; Platelets 369; Potassium 4.2; Sodium 139  Lipids performed August 2017 total cholesterol 125, HDL 46, LDL 66, triglycerides 67, A1c 5.9% TSH 2.64   ASSESSMENT:    1. Chronic diastolic congestive heart failure ([place])   2. Essential hypertension   3. OSA (obstructive sleep apnea)   4. Renal artery stenosis (right) s/p stent 2012   5. Hypercholesterolemia   6. Severe obesity with body mass index (BMI) of 35.0 to 39.9 with comorbidity ([place])      PLAN:  In order of problems listed above:  1. CHF: NYHA functional class I, clinically euvolemic 2. HTN: Blood pressure was little high today, increase telmisartan to 40 mg once daily. 3. OSA: she is compliant with CPAP and reports clinical benefit 4. HLP: Excellent lipid profile 5. Obesity: weight loss recommended.  Reengage in her physical activity in the rehab program would help this.  She can also get dietary advice during the rehab. 6. History of renal artery stenosis/stent: To reevaluate if her blood pressure is difficult to control    Medication Adjustments/Labs and Tests Ordered: Current medicines are reviewed at length with the patient today.  Concerns regarding medicines are outlined above.  Medication changes, Labs and Tests ordered today  are listed in the Patient Instructions below. Patient Instructions  Dr Sallyanne Kuster recommends that you schedule a follow-up appointment in JUNE 2019. You will receive a reminder letter in the mail two months in advance. If you don't receive a letter, please call our office to schedule the follow-up appointment.  If you need a refill on your cardiac medications before your next appointment, please call your pharmacy.    Signed, Sanda Klein, MD  08/22/2017 3:00 PM    [address], [place], [place]  [address] Phone: [phone]; Fax: [phone]

## 2017-08-21 NOTE — Patient Instructions (Signed)
Dr Sallyanne Kuster recommends that you schedule a follow-up appointment in JUNE 2019. You will receive a reminder letter in the mail two months in advance. If you don't receive a letter, please call our office to schedule the follow-up appointment.  If you need a refill on your cardiac medications before your next appointment, please call your pharmacy.

## 2017-08-23 ENCOUNTER — Encounter (HOSPITAL_COMMUNITY): Payer: BC Managed Care – PPO

## 2017-08-25 ENCOUNTER — Encounter (HOSPITAL_COMMUNITY): Payer: BC Managed Care – PPO

## 2017-08-28 ENCOUNTER — Encounter (HOSPITAL_COMMUNITY): Payer: BC Managed Care – PPO

## 2017-08-30 ENCOUNTER — Encounter (HOSPITAL_COMMUNITY): Payer: BC Managed Care – PPO

## 2017-09-01 ENCOUNTER — Encounter (HOSPITAL_COMMUNITY): Payer: BC Managed Care – PPO

## 2017-09-04 ENCOUNTER — Encounter (HOSPITAL_COMMUNITY): Payer: BC Managed Care – PPO

## 2017-09-06 ENCOUNTER — Encounter (HOSPITAL_COMMUNITY): Payer: BC Managed Care – PPO

## 2017-09-08 ENCOUNTER — Encounter (HOSPITAL_COMMUNITY): Payer: BC Managed Care – PPO

## 2017-09-11 ENCOUNTER — Encounter (HOSPITAL_COMMUNITY): Payer: BC Managed Care – PPO

## 2017-09-11 ENCOUNTER — Telehealth: Payer: Self-pay | Admitting: Cardiovascular Disease

## 2017-09-11 NOTE — Telephone Encounter (Signed)
Left a message to call back.

## 2017-09-11 NOTE — Telephone Encounter (Signed)
New message    Patient calling with her medical condition would it be safe for her to drive to [place] this week.

## 2017-09-11 NOTE — Telephone Encounter (Signed)
Returned the call to the patient to inform her that it was okay for her to travel to [place]. She verbalized her understanding.

## 2017-09-13 ENCOUNTER — Encounter (HOSPITAL_COMMUNITY): Payer: BC Managed Care – PPO

## 2017-09-13 ENCOUNTER — Other Ambulatory Visit: Payer: Self-pay | Admitting: *Deleted

## 2017-09-13 ENCOUNTER — Other Ambulatory Visit: Payer: Self-pay

## 2017-09-13 MED ORDER — TELMISARTAN 40 MG PO TABS: 40.0000 mg | ORAL_TABLET | Freq: Every day | ORAL | 1 refills | Status: DC

## 2017-09-13 NOTE — Telephone Encounter (Signed)
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  States that she needs a new RX for her telmisartan increase from 20mg  to 40mg , It was increased 08/21/17.  Still has fills left at [facility] pharmacy.  Please advise

## 2017-09-13 NOTE — Telephone Encounter (Signed)
Per MD note 08/21/17: HTN: Blood pressure was little high today, increase telmisartan to 40 mg once daily.  Med list updated and Rx sent to pharmacy

## 2017-09-15 ENCOUNTER — Encounter (HOSPITAL_COMMUNITY): Payer: BC Managed Care – PPO

## 2017-09-18 ENCOUNTER — Encounter (HOSPITAL_COMMUNITY): Payer: BC Managed Care – PPO

## 2017-09-20 ENCOUNTER — Encounter (HOSPITAL_COMMUNITY): Payer: BC Managed Care – PPO

## 2017-10-10 NOTE — Progress Notes (Signed)
[place] OFFICE PROGRESS NOTE   [name], [place] [address]  DIAGNOSIS: Essential thrombocythemia [facility])  CC: follow up Essential Thrombocytosis  CURRENT THERAPY:  -Hydroxyurea 500 mg daily, started in 2010., increased to hydrea 1000mg  daily on Monday and Thursday, and 500mg  daily for the rest of week in 01/2015,  Aspirin 81 mg daily.  -Switched Hydrea to anagrelide 1 mg daily 07/2016  INTERVAL HISTORY:   Sara [name] 61 y.o. female with history of JAK positive Essential thrombocythemia is here for follow up. She presents to the clinic today accompanied by her husband.  She notes she is done with cardiac rehab after 6 weeks. She has returned to working full time so her rehab was cut short. She plans to conitnue with walking and exercising.  She notes her BP medication was increased due to her uncontrolled HTN. She takes 2 tabs of tylenol 325mg  at night. She wonders what caused her seizure as it was not her heart condition or her weight. Overall she has been well.     MEDICAL HISTORY: Past Medical History:  Diagnosis Date  . Diabetes mellitus   . Gout   . Hyperlipemia   . Hypertension   . LVH (left ventricular hypertrophy)    12/21/09 Echo -mild asymmetric LVH,EF =>55%  . Morbid obesity ([place])   . OSA (obstructive sleep apnea)   . PAD (peripheral artery disease) ([place])   . Renal artery stenosis ([place]) 12/13/2010   PTCA and Stent - right  . Seizures ([place])   . Syncopal episodes   . Thrombocytosis ([facility])      ALLERGIES:  is allergic to sulfa antibiotics.  MEDICATIONS:  Allergies as of 10/11/2017      Reactions   Sulfa Antibiotics Itching      Medication List        Accurate as of 10/11/17  5:16 PM. Always use your most recent med list.          acetaminophen 325 MG tablet Commonly known as:  TYLENOL Take 2 tablets (650 mg total) by mouth every 6 (six) hours as needed for mild pain.   anagrelide 1 MG  capsule Commonly known as:  [name] Take 1 capsule (1 mg total) by mouth daily.   aspirin 325 MG EC tablet Take 1 tablet (325 mg total) by mouth daily.   atorvastatin 40 MG tablet Commonly known as:  LIPITOR Take 1 tablet (40 mg total) by mouth daily at 6 PM.   B COMPLEX PO Take 1 tablet by mouth daily.   Metoprolol Tartrate 75 MG Tabs Take 75 mg by mouth 2 (two) times daily.   telmisartan 40 MG tablet Commonly known as:  MICARDIS Take 1 tablet (40 mg total) by mouth daily.   [name] 100 MG Tabs Generic drug:  Lacosamide 1/2 tablet in the morning and 1.5 tablets in the evening   Vitamin D3 2000 units capsule Take 2,000 Units by mouth daily.       SURGICAL HISTORY:  Past Surgical History:  Procedure Laterality Date  . BREAST CYST EXCISION     S/P Benign Right Breast Cyst Removal.  . BREAST REDUCTION SURGERY  1995   S/P Bilateral breast reduction  . [place]   Times  Two.  . CORONARY ARTERY BYPASS GRAFT N/A 04/21/2017   Procedure: CORONARY ARTERY BYPASS GRAFTING (CABG) x 2, USING LEFT MAMMARY ARTERY AND RIGHT GREATER SAPHENOUS VEIN HARVESTED ENDOSCOPICALLY;  Surgeon: Cyndia Bent,  Fernande Boyden, MD;  Location: [facility];  Service: Open Heart Surgery;  Laterality: N/A;  . EXTREMITY CYST EXCISION  1993   left wrist  . LEFT HEART CATH AND CORONARY ANGIOGRAPHY N/A 04/17/2017   Procedure: Left Heart Cath and Coronary Angiography;  Surgeon: Jettie Booze, MD;  Location: [place] CV LAB;  Service: Cardiovascular;  Laterality: N/A;  . PARTIAL HYSTERECTOMY  1994  . renal artery stent placement Right   . TEE WITHOUT CARDIOVERSION N/A 04/21/2017   Procedure: TRANSESOPHAGEAL ECHOCARDIOGRAM (TEE);  Surgeon: [name], MD;  Location: [facility];  Service: Open Heart Surgery;  Laterality: N/A;    REVIEW OF SYSTEMS:  Constitutional: Denies fevers, chills or abnormal weight loss Eyes: Denies blurriness of vision Ears, nose, mouth, throat, and face: Denies mucositis or  sore throat Respiratory: Denies cough, dyspnea or wheezes Cardiovascular: Denies palpitation, chest discomfort or lower extremity swelling Gastrointestinal:  Denies nausea, heartburn. Skin: Denies abnormal skin rashes Lymphatics: Denies new lymphadenopathy or easy bruising Neurological:Denies numbness, tingling or new weaknesses.  Behavioral/Psych: Mood is stable, no new changes  All other systems were reviewed with the patient and are negative.  PHYSICAL EXAMINATION: ECOG PERFORMANCE STATUS: 0  Blood pressure (!) 181/76, pulse (!) 59, temperature 97.7 F (36.5 C), temperature source Oral, resp. rate 20, height 5\' 2"  (1.575 m), weight 209 lb 1.6 oz (94.8 kg), SpO2 99 %.  GENERAL:alert, no distress and comfortable; obese pleasant middle-age woman SKIN: skin color, texture, turgor are normal, no rashes or significant lesions EYES: normal, Conjunctiva are pink and non-injected, sclera clear OROPHARYNX:no exudate, no erythema and lips, buccal mucosa, and tongue normal  NECK: supple, thyroid normal size, non-tender, without nodularity LYMPH:  no palpable lymphadenopathy in the cervical, axillary or inguinal LUNGS: clear to auscultation and percussion with normal breathing effort HEART: regular rate & rhythm and no murmurs and no lower extremity edema ABDOMEN:abdomen soft, non-tender,normal bowel sounds Musculoskeletal:no cyanosis of digits and no clubbing  NEURO: alert & oriented x 3 with fluent speech, no focal motor/sensory deficits   LABORATORY DATA:  CBC Latest Ref Rng & Units 10/11/2017 08/11/2017 06/12/2017  WBC 3.9 - 10.3 10e3/uL 5.9 6.9 5.9  Hemoglobin 11.6 - 15.9 g/dL 13.9 13.0 13.2  Hematocrit 34.8 - 46.6 % 42.0 40.0 40.2  Platelets 145 - 400 10e3/uL 373 369 351    CMP Latest Ref Rng & Units 08/11/2017 06/05/2017 04/24/2017  Glucose 70 - 140 mg/dl 93 131(H) 85  BUN 7.0 - 26.0 mg/dL 15.0 15 12  Creatinine 0.6 - 1.1 mg/dL 1.1 1.18(H) 1.13(H)  Sodium 136 - 145 mEq/L 139 144  139  Potassium 3.5 - 5.1 mEq/L 4.2 4.8 4.4  Chloride 96 - 106 mmol/L - 107(H) 107  CO2 22 - 29 mEq/L 27 23 25   Calcium 8.4 - 10.4 mg/dL 9.2 9.4 8.6(L)  Total Protein 6.4 - 8.3 g/dL 7.0 6.5 -  Total Bilirubin 0.20 - 1.20 mg/dL 0.37 0.2 -  Alkaline Phos 40 - 150 U/L 88 80 -  AST 5 - 34 U/L 27 15 -  ALT 0 - 55 U/L 26 15 -   Pathology report 01/20/2009 BONE MARROW, RIGHT POSTERIOR ILIAC CREST, BIOPSY AND ASPIRATION: - CELLULAR MARROW WITH TRILINEAGE HEMATOPOIESIS AND MATURATION. - NO EVIDENCE OF EXCESS BLASTS.   BONE MARROW ASPIRATE: The aspirate material is composed of a few somewhat cellular bone marrow particles displaying a mixture of cell types. Erythroid and granulocytic series show orderly and progressive maturation. Megakaryocytes are abundant with predominantly normal  morphology.  BONE MARROW CORE BIOPSY: The bone marrow core biopsy has very prominent aspiration artifact. Sections show 40 to 60% cellularity with a mixture of cell types. The megakaryocytes are abundant with normal morphology. No megakaryocyte clustering is seen. The granulocytic series and erythroid series show orderly and progressive maturation. No excess blasts are seen.  Karyotype: 46,XX[20].nuc ish (ABL,BCR)x2  Interpretation: Cytogenetic Analysis:Normal: Cytogenetic analysis revealed the presence of normal female chromosomes with no observable clonal chromosomal abnormalities.  Molecular Cytogenetic Analysis FISH: Normal: The investigative technique of molecular cytogenetic analysis with DNA probes specific for the Major/Minor ABL (9q34) and BCR (22q11) genes revealed the lack of significant number of BCR/ABL fusion signals. These results are consistent with a normal finding and a lack of the BCR/ABL fusion associated with CML/ALL.  RADIOGRAPHIC STUDIES:  CT Abdomen without Contrast 12/30/15 IMPRESSION: 1. Midline supraumbilical ventral hernia contains unobstructed  colon. 2. Two smaller fat containing periumbilical hernias.  ASSESSMENT:  Essential thrombocythemia. Diagnosed in 2010. JAK2 mutation was present. The patient underwent a bone marrow aspirate and biopsy on 01/20/2009 which yielded a specimen that was suboptimal for evaluation. However, megakaryocytes were abundant with normal morphology. There was no clustering or excess blasts seen. Platelet count on 01/20/2009 was 881. The patient is being treated with aspirin and Anagrelide.   PLAN:  1. Essential Thrombocythemia. JAK2 (+)  --I previously reviewed her above laboratory results and history extensively. I explained to her this chronic disease, not curable but treatable. Small percentage patient will develop myelofibrosis or leukemia in late stage. The disease related risk is thrombosis, including stroke and heart attack. -Her platelet count has been well controlled  -Her previous syncope and seizure likely activities are probably not related to hydrea, also I cannot rule out completely. -The patient was concerned about long-term side effects from [facility], especially risk of leukemia. -as such, I previously switched her Hydrea to anagrelide (October 2017), she is tolerating very well, her platelet counts has been normal. Will continue anagrelide -Labs reviewed today, her CBC overall within normal limits.  -I discussed anagrelide can increase her risk of cardiac issues, although I don't know how much it contributed to her heart attack in 03/2017. She does have HTN, obesity etc other significant cardiovascular disease risk factors. If she has further active carciac issues, will probably switch her back to [facility]  -Given her stable normal blood counts we will follow up in 4 months    2. Seizure -continue meds, follow up with her neurologist.  3. Syncope episode -Unclear etiology. -I suspect the possibility of her syncope is probably not related to medications.  4. AKI -She has slightly elevated  creatinine on 06/12/17, likely related to her diuretics, which was recommended by her cardiologist. -I encouraged patient to discuss with her cardiologist to see if needed she needs back off her diuretics. She has no clinical signs of edema or fluid overloaded.  5. Diabetes -She has been seeing a nutritionist at church. -She monitors her sugar level regularly -She has intentionally lost about 17 lbs.  6. Coronary artery disease -Coronary artery disease status post bypass surgery grafting x 2 by Dr. Cyndia Bent in June 2018. -She completed cardiac rehab  PLAN: -Lab and f/u in 4 months  -Continue anagrelide    All questions were answered. The patient knows to call the clinic with any problems, questions or concerns. We can certainly see the patient much sooner if necessary.  I spent 15 minutes counseling the patient face to face. The total time  spent in the appointment was 20 minutes.  This document serves as a record of services personally performed by Truitt Merle, MD. It was created on her behalf by Joslyn Devon, a trained medical scribe. The creation of this record is based on the scribe's personal observations and the provider's statements to them.    I have reviewed the above documentation for accuracy and completeness, and I agree with the above.    Truitt Merle  10/11/2017

## 2017-10-11 ENCOUNTER — Ambulatory Visit (HOSPITAL_BASED_OUTPATIENT_CLINIC_OR_DEPARTMENT_OTHER): Payer: BC Managed Care – PPO | Admitting: Hematology

## 2017-10-11 ENCOUNTER — Encounter: Payer: Self-pay | Admitting: Hematology

## 2017-10-11 ENCOUNTER — Other Ambulatory Visit (HOSPITAL_BASED_OUTPATIENT_CLINIC_OR_DEPARTMENT_OTHER): Payer: BC Managed Care – PPO

## 2017-10-11 ENCOUNTER — Telehealth: Payer: Self-pay | Admitting: Hematology

## 2017-10-11 VITALS — BP 181/76 | HR 59 | Temp 97.7°F | Resp 20 | Ht 62.0 in | Wt 209.1 lb

## 2017-10-11 DIAGNOSIS — N179 Acute kidney failure, unspecified: Secondary | ICD-10-CM | POA: Diagnosis not present

## 2017-10-11 DIAGNOSIS — D473 Essential (hemorrhagic) thrombocythemia: Secondary | ICD-10-CM

## 2017-10-11 DIAGNOSIS — I2510 Atherosclerotic heart disease of native coronary artery without angina pectoris: Secondary | ICD-10-CM

## 2017-10-11 DIAGNOSIS — G4089 Other seizures: Secondary | ICD-10-CM | POA: Diagnosis not present

## 2017-10-11 DIAGNOSIS — E119 Type 2 diabetes mellitus without complications: Secondary | ICD-10-CM | POA: Diagnosis not present

## 2017-10-11 DIAGNOSIS — R55 Syncope and collapse: Secondary | ICD-10-CM

## 2017-10-11 LAB — CBC WITH DIFFERENTIAL/PLATELET
BASO%: 0.7 % (ref 0.0–2.0)
BASOS ABS: 0 10*3/uL (ref 0.0–0.1)
EOS ABS: 0.3 10*3/uL (ref 0.0–0.5)
EOS%: 5.7 % (ref 0.0–7.0)
HEMATOCRIT: 42 % (ref 34.8–46.6)
HEMOGLOBIN: 13.9 g/dL (ref 11.6–15.9)
LYMPH%: 31 % (ref 14.0–49.7)
MCH: 28.3 pg (ref 25.1–34.0)
MCHC: 33.1 g/dL (ref 31.5–36.0)
MCV: 85.4 fL (ref 79.5–101.0)
MONO#: 0.4 10*3/uL (ref 0.1–0.9)
MONO%: 6.6 % (ref 0.0–14.0)
NEUT#: 3.3 10*3/uL (ref 1.5–6.5)
NEUT%: 56 % (ref 38.4–76.8)
NRBC: 0 % (ref 0–0)
PLATELETS: 373 10*3/uL (ref 145–400)
RBC: 4.92 10*6/uL (ref 3.70–5.45)
RDW: 15.6 % — AB (ref 11.2–14.5)
WBC: 5.9 10*3/uL (ref 3.9–10.3)
lymph#: 1.8 10*3/uL (ref 0.9–3.3)

## 2017-10-11 MED ORDER — ANAGRELIDE HCL 1 MG PO CAPS: 1.0000 mg | ORAL_CAPSULE | Freq: Every day | ORAL | 5 refills | Status: DC

## 2017-10-11 NOTE — Telephone Encounter (Signed)
Scheduled appt per 12/19 los - Gave patient AVS and calender per los. Lab and f/u in 4 months.

## 2017-11-20 ENCOUNTER — Other Ambulatory Visit: Payer: Self-pay

## 2017-11-20 MED ORDER — METOPROLOL TARTRATE 75 MG PO TABS: 75.0000 mg | ORAL_TABLET | Freq: Two times a day (BID) | ORAL | 6 refills | Status: DC

## 2018-01-18 ENCOUNTER — Ambulatory Visit: Payer: BC Managed Care – PPO | Admitting: Neurology

## 2018-01-18 ENCOUNTER — Encounter: Payer: Self-pay | Admitting: Neurology

## 2018-01-18 VITALS — BP 197/92 | HR 69 | Ht 62.0 in | Wt 211.5 lb

## 2018-01-18 DIAGNOSIS — R569 Unspecified convulsions: Secondary | ICD-10-CM

## 2018-01-18 NOTE — Progress Notes (Signed)
Reason for visit: Seizures  Sara [name] is an 62 y.o. female  History of present illness:  Sara [name] is a 62 year old right-handed black female with a history of seizures as well controlled currently on [name].  The patient takes 50 mg in the morning and 150 mg in the evening and she is tolerating the medication quite well.  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  She indicates that she will go to bed around 9 PM but she will wake up around 2 AM and have difficulty getting back to sleep for a couple hours, she gets up at 6:00 in the morning to go to work.  She feels rested during the day, she does not feel that she is low on energy.   Past Medical History:  Diagnosis Date  . Diabetes mellitus   . Gout   . Hyperlipemia   . Hypertension   . LVH (left ventricular hypertrophy)    12/21/09 Echo -mild asymmetric LVH,EF =>55%  . Morbid obesity ([place])   . OSA (obstructive sleep apnea)   . PAD (peripheral artery disease) ([place])   . Renal artery stenosis ([place]) 12/13/2010   PTCA and Stent - right  . Seizures ([place])   . Syncopal episodes   . Thrombocytosis ([place])     Past Surgical History:  Procedure Laterality Date  . BREAST CYST EXCISION     S/P Benign Right Breast Cyst Removal.  . BREAST REDUCTION SURGERY  1995   S/P Bilateral breast reduction  . [place]   Times  Two.  . CORONARY ARTERY BYPASS GRAFT N/A 04/21/2017   Procedure: CORONARY ARTERY BYPASS GRAFTING (CABG) x 2, USING LEFT MAMMARY ARTERY AND RIGHT GREATER SAPHENOUS VEIN HARVESTED ENDOSCOPICALLY;  Surgeon: [name], MD;  Location: [facility];  Service: Open Heart Surgery;  Laterality: N/A;  . EXTREMITY CYST EXCISION  1993   left wrist  . LEFT HEART CATH AND CORONARY ANGIOGRAPHY N/A 04/17/2017   Procedure: Left Heart Cath and Coronary Angiography;  Surgeon: Jettie Booze, MD;  Location: [place] CV LAB;  Service: Cardiovascular;  Laterality: N/A;  .  PARTIAL HYSTERECTOMY  1994  . renal artery stent placement Right   . TEE WITHOUT CARDIOVERSION N/A 04/21/2017   Procedure: TRANSESOPHAGEAL ECHOCARDIOGRAM (TEE);  Surgeon: [name], MD;  Location: [place];  Service: Open Heart Surgery;  Laterality: N/A;    Family History  Problem Relation Age of Onset  . Heart failure Mother   . Diabetes Father   . Diabetes Sister   . Hypertension Sister   . Hypertension Sister   . Hypertension Sister   . Breast cancer Maternal Aunt        2 maternal aunts had breast cancer.  . Breast cancer Maternal Grandmother     Social history:  reports that she has never smoked. She has never used smokeless tobacco. She reports that she does not drink alcohol or use drugs.    Allergies  Allergen Reactions  . Sulfa Antibiotics Itching    Medications:  Prior to Admission medications   Medication Sig Start Date End Date Taking? Authorizing Provider  acetaminophen (TYLENOL) 325 MG tablet Take 2 tablets (650 mg total) by mouth every 6 (six) hours as needed for mild pain. 04/26/17  Yes Lars Pinks M, PA-C  anagrelide ([name]) 1 MG capsule Take 1 capsule (1 mg total) by mouth daily. 10/11/17  Yes Truitt Merle, MD  aspirin EC 325 MG EC tablet Take 1 tablet (325 mg total) by mouth daily. 04/27/17  Yes Lars Pinks M, PA-C  atorvastatin (LIPITOR) 40 MG tablet Take 1 tablet (40 mg total) by mouth daily at 6 PM. 05/22/17  Yes Barrett, Rhonda G, PA-C  B Complex Vitamins (B COMPLEX PO) Take 1 tablet by mouth daily.    Yes [provider]  Cholecalciferol (VITAMIN D3) 2000 UNITS capsule Take 2,000 Units by mouth daily.    Yes [provider]  Metoprolol Tartrate 75 MG TABS Take 75 mg by mouth 2 (two) times daily. 11/20/17  Yes Croitoru, Mihai, MD  telmisartan (MICARDIS) 40 MG tablet Take 1 tablet (40 mg total) by mouth daily. 09/13/17  Yes Croitoru, Mihai, MD  [name] 100 MG TABS 1/2 tablet in the morning and 1.5 tablets in the evening 07/21/17   Yes [name], MD    ROS:  Out of a complete 14 system review of symptoms, the patient complains only of the following symptoms, and all other reviewed systems are negative. The patient is markedly obese.   Blood pressure (!) 197/92, pulse 69, height 5\' 2"  (1.575 m), weight 211 lb 8 oz (95.9 kg).  Physical Exam  General: The patient is alert and cooperative at the time of the examination.  Skin: No significant peripheral edema is noted.   Neurologic Exam  Mental status: The patient is alert and oriented x 3 at the time of the examination. The patient has apparent normal recent and remote memory, with an apparently normal attention span and concentration ability.   Cranial nerves: Facial symmetry is present. Speech is normal, no aphasia or dysarthria is noted. Extraocular movements are full. Visual fields are full.  Motor: The patient has good strength in all 4 extremities.  Sensory examination: Soft touch sensation is symmetric on the face, arms, and legs.  Coordination: The patient has good finger-nose-finger and heel-to-shin bilaterally.  Gait and station: The patient has a normal gait. Tandem gait is normal. Romberg is negative. No drift is seen.  Reflexes: Deep tendon reflexes are symmetric.   Assessment/Plan:  1.  History of seizures  The patient will continue the [name] for now, she seems to be doing well with this.  She will follow-up in 6 months, if she continues to be well controlled, we can follow her on an annual basis at that point. Blood pressure today was significantly elevated.  Jill Alexanders MD 01/18/2018 8:01 AM  [facility] [address] [place], [place] [address]  Phone [phone] Fax [phone]

## 2018-02-06 ENCOUNTER — Telehealth: Payer: Self-pay | Admitting: Hematology

## 2018-02-06 NOTE — Telephone Encounter (Signed)
Patient called to verify appointment

## 2018-02-08 NOTE — Progress Notes (Signed)
[facility] OFFICE PROGRESS NOTE  [name], [address] [employer] [place] [address]  DIAGNOSIS: Essential thrombocythemia [facility])  CC: follow up Essential Thrombocytosis  CURRENT THERAPY:  -Hydroxyurea 500 mg daily, started in 2010., increased to hydrea 1000mg  daily on Monday and Thursday, and 500mg  daily for the rest of week in 01/2015,  Aspirin 81 mg daily.  -Switched Hydrea to anagrelide 1 mg daily 07/2016  INTERVAL HISTORY:   Sara [name] 62 y.o. female with history of JAK positive Essential thrombocythemia is here for follow up. She presents to the clinic today accompanied by her husband. She reports she is doing well overall. She is compliant with 1 mg Anagrelide daily. She reports no recent seizure activity or new change in her cardiac problems. She states her glucose has been good lately.   On review of systems, pt denies any other complaints at this time. Pertinent positives are listed and detailed within the above HPI.   MEDICAL HISTORY: Past Medical History:  Diagnosis Date  . Diabetes mellitus   . Gout   . Hyperlipemia   . Hypertension   . LVH (left ventricular hypertrophy)    12/21/09 Echo -mild asymmetric LVH,EF =>55%  . Morbid obesity ([place])   . OSA (obstructive sleep apnea)   . PAD (peripheral artery disease) ([place])   . Renal artery stenosis ([place]) 12/13/2010   PTCA and Stent - right  . Seizures ([place])   . Syncopal episodes   . Thrombocytosis ([facility])      ALLERGIES:  is allergic to sulfa antibiotics.  MEDICATIONS:  Allergies as of 02/09/2018      Reactions   Sulfa Antibiotics Itching      Medication List        Accurate as of 02/09/18  4:49 PM. Always use your most recent med list.          acetaminophen 325 MG tablet Commonly known as:  TYLENOL Take 2 tablets (650 mg total) by mouth every 6 (six) hours as needed for mild pain.   anagrelide 1 MG capsule Commonly known as:  [name] Take 1 capsule (1 mg total)  by mouth daily.   aspirin 325 MG EC tablet Take 1 tablet (325 mg total) by mouth daily.   atorvastatin 40 MG tablet Commonly known as:  LIPITOR Take 1 tablet (40 mg total) by mouth daily at 6 PM.   B COMPLEX PO Take 1 tablet by mouth daily.   Metoprolol Tartrate 75 MG Tabs Take 75 mg by mouth 2 (two) times daily.   telmisartan 40 MG tablet Commonly known as:  MICARDIS Take 1 tablet (40 mg total) by mouth daily.   [name] 100 MG Tabs Generic drug:  Lacosamide 1/2 tablet in the morning and 1.5 tablets in the evening   Vitamin D3 2000 units capsule Take 2,000 Units by mouth daily.       SURGICAL HISTORY:  Past Surgical History:  Procedure Laterality Date  . BREAST CYST EXCISION     S/P Benign Right Breast Cyst Removal.  . BREAST REDUCTION SURGERY  1995   S/P Bilateral breast reduction  . [place]   Times  Two.  . CORONARY ARTERY BYPASS GRAFT N/A 04/21/2017   Procedure: CORONARY ARTERY BYPASS GRAFTING (CABG) x 2, USING LEFT MAMMARY ARTERY AND RIGHT GREATER SAPHENOUS VEIN HARVESTED ENDOSCOPICALLY;  Surgeon: [name], MD;  Location: [facility];  Service: Open Heart Surgery;  Laterality: N/A;  . EXTREMITY CYST  EXCISION  1993   left wrist  . LEFT HEART CATH AND CORONARY ANGIOGRAPHY N/A 04/17/2017   Procedure: Left Heart Cath and Coronary Angiography;  Surgeon: Jettie Booze, MD;  Location: [facility];  Service: Cardiovascular;  Laterality: N/A;  . PARTIAL HYSTERECTOMY  1994  . renal artery stent placement Right   . TEE WITHOUT CARDIOVERSION N/A 04/21/2017   Procedure: TRANSESOPHAGEAL ECHOCARDIOGRAM (TEE);  Surgeon: [name], MD;  Location: [facility];  Service: Open Heart Surgery;  Laterality: N/A;    REVIEW OF SYSTEMS:   Constitutional: Denies fevers, chills or abnormal weight loss Eyes: Denies blurriness of vision Ears, nose, mouth, throat, and face: Denies mucositis or sore throat Respiratory: Denies cough, dyspnea or  wheezes Cardiovascular: Denies palpitation, chest discomfort or lower extremity swelling Gastrointestinal:  Denies nausea, heartburn. Skin: Denies abnormal skin rashes Lymphatics: Denies new lymphadenopathy or easy bruising Neurological:Denies numbness, tingling or new weaknesses.  Behavioral/Psych: Mood is stable, no new changes  All other systems were reviewed with the patient and are negative.  PHYSICAL EXAMINATION: ECOG PERFORMANCE STATUS: 0  Blood pressure (!) 176/66, pulse 62, temperature 98.1 F (36.7 C), temperature source Oral, resp. rate 18, height 5\' 2"  (1.575 m), weight 213 lb 6.4 oz (96.8 kg), SpO2 97 %.  GENERAL:alert, no distress and comfortable; obese pleasant middle-age woman SKIN: skin color, texture, turgor are normal, no rashes or significant lesions EYES: normal, Conjunctiva are pink and non-injected, sclera clear OROPHARYNX:no exudate, no erythema and lips, buccal mucosa, and tongue normal  NECK: supple, thyroid normal size, non-tender, without nodularity LYMPH:  no palpable lymphadenopathy in the cervical, axillary or inguinal LUNGS: clear to auscultation and percussion with normal breathing effort HEART: regular rate & rhythm and no murmurs and no lower extremity edema ABDOMEN:abdomen soft, non-tender,normal bowel sounds. (+) abdominal wall protruding above the umbilical area. No splenomegaly Musculoskeletal:no cyanosis of digits and no clubbing  NEURO: alert & oriented x 3 with fluent speech, no focal motor/sensory deficits   LABORATORY DATA:  CBC Latest Ref Rng & Units 02/09/2018 10/11/2017 08/11/2017  WBC 3.9 - 10.3 K/uL 6.0 5.9 6.9  Hemoglobin 11.6 - 15.9 g/dL 13.3 13.9 13.0  Hematocrit 34.8 - 46.6 % 41.0 42.0 40.0  Platelets 145 - 400 K/uL 427(H) 373 369    CMP Latest Ref Rng & Units 02/09/2018 08/11/2017 06/05/2017  Glucose 70 - 140 mg/dL 77 93 131(H)  BUN 7 - 26 mg/dL 17 15.0 15  Creatinine 0.60 - 1.10 mg/dL 1.08 1.1 1.18(H)  Sodium 136 - 145  mmol/L 142 139 144  Potassium 3.5 - 5.1 mmol/L 4.3 4.2 4.8  Chloride 98 - 109 mmol/L 107 - 107(H)  CO2 22 - 29 mmol/L 29 27 23   Calcium 8.4 - 10.4 mg/dL 9.3 9.2 9.4  Total Protein 6.4 - 8.3 g/dL 6.8 7.0 6.5  Total Bilirubin 0.2 - 1.2 mg/dL 0.4 0.37 0.2  Alkaline Phos 40 - 150 U/L 85 88 80  AST 5 - 34 U/L 20 27 15   ALT 0 - 55 U/L 23 26 15    Pathology report 01/20/2009 BONE MARROW, RIGHT POSTERIOR ILIAC CREST, BIOPSY AND ASPIRATION: - CELLULAR MARROW WITH TRILINEAGE HEMATOPOIESIS AND MATURATION. - NO EVIDENCE OF EXCESS BLASTS.   BONE MARROW ASPIRATE: The aspirate material is composed of a few somewhat cellular bone marrow particles displaying a mixture of cell types. Erythroid and granulocytic series show orderly and progressive maturation. Megakaryocytes are abundant with predominantly normal morphology.  BONE MARROW CORE BIOPSY: The bone marrow  core biopsy has very prominent aspiration artifact. Sections show 40 to 60% cellularity with a mixture of cell types. The megakaryocytes are abundant with normal morphology. No megakaryocyte clustering is seen. The granulocytic series and erythroid series show orderly and progressive maturation. No excess blasts are seen.  Karyotype: 46,XX[20].nuc ish (ABL,BCR)x2  Interpretation: Cytogenetic Analysis:Normal: Cytogenetic analysis revealed the presence of normal female chromosomes with no observable clonal chromosomal abnormalities.  Molecular Cytogenetic Analysis FISH: Normal: The investigative technique of molecular cytogenetic analysis with DNA probes specific for the Major/Minor ABL (9q34) and BCR (22q11) genes revealed the lack of significant number of BCR/ABL fusion signals. These results are consistent with a normal finding and a lack of the BCR/ABL fusion associated with CML/ALL.  RADIOGRAPHIC STUDIES:  CT Abdomen without Contrast 12/30/15 IMPRESSION: 1. Midline supraumbilical ventral hernia contains  unobstructed colon. 2. Two smaller fat containing periumbilical hernias.  ASSESSMENT:  Essential thrombocythemia. Diagnosed in 2010. JAK2 mutation was present. The patient underwent a bone marrow aspirate and biopsy on 01/20/2009 which yielded a specimen that was suboptimal for evaluation. However, megakaryocytes were abundant with normal morphology. There was no clustering or excess blasts seen. Platelet count on 01/20/2009 was 881. The patient is being treated with aspirin and Anagrelide.   PLAN:  1. Essential Thrombocythemia. JAK2 (+)  -I previously reviewed her above laboratory results and history extensively. I explained to her this chronic disease, not curable but treatable. Small percentage patient will develop myelofibrosis or leukemia in late stage. The disease related risk is thrombosis, including stroke and heart attack. -Her platelet count has been well controlled  -Her previous syncope and seizure likely activities are probably not related to hydrea, also I cannot rule out completely. -The patient was concerned about long-term side effects from [facility], especially risk of leukemia. -as such, I previously switched her Hydrea to anagrelide (October 2017), she is tolerating very well, her platelet counts have been normal. Will continue anagrelide -I previously discussed anagrelide can increase her risk of cardiac issues, although I don't know how much it contributed to her heart attack in 03/2017. She does have HTN, obesity etc other significant cardiovascular disease risk factors. If she has further active carciac issues, will probably switch her back to [facility]  -Labs reviewed today, her CBC overall within normal limits except her platelets are 427K. This is mildly elevated. She will return for lab in 2 months. Will titrate her anagrelide dose if needed - follow up in 6 months   2. Seizure -continue meds, follow up with her neurologist.  3. Syncope episode -Unclear etiology. -I suspected  the possibility of her syncope is probably not related to medications.  4. AKI -She had slightly elevated creatinine on 06/12/17, likely related to her diuretics, which was recommended by her cardiologist. -I previously encouraged patient to discuss with her cardiologist to see if needed she needs back off her diuretics. She has no clinical signs of edema or fluid overloaded. -normal Cr today (02/09/18)  5. Diabetes -She has been seeing a nutritionist at church. -She monitors her sugar level regularly -She has intentionally lost about 17 lbs.  6. Coronary artery disease -Coronary artery disease status post bypass surgery grafting x 2 by Dr. Cyndia Bent in June 2018. -She completed cardiac rehab  7. Health Maintenance -She is due for a mammogram, she agrees to get it done this year  8. Abdominal wall hernia -She has a hernia in the abdominal wall just above the umbilical area. I advised her to avoid heavy lifting  or strenuous coughing.   PLAN: -Lab  In 2 months and maybe in 4 months if she needs - f/u in 6 months  -Continue anagrelide 1 mg daily, will titrate it if she has worsening thrombocytosis    All questions were answered. The patient knows to call the clinic with any problems, questions or concerns. We can certainly see the patient much sooner if necessary.  I spent 15 minutes counseling the patient face to face. The total time spent in the appointment was 20 minutes.  This document serves as a record of services personally performed by Truitt Merle, MD. It was created on her behalf by Theresia Bough, a trained medical scribe. The creation of this record is based on the scribe's personal observations and the provider's statements to them.   I have reviewed the above documentation for accuracy and completeness, and I agree with the above.   Truitt Merle  02/09/2018 4:49 PM

## 2018-02-09 ENCOUNTER — Inpatient Hospital Stay: Payer: BC Managed Care – PPO | Attending: Hematology | Admitting: Hematology

## 2018-02-09 ENCOUNTER — Telehealth: Payer: Self-pay | Admitting: Hematology

## 2018-02-09 ENCOUNTER — Inpatient Hospital Stay: Payer: BC Managed Care – PPO

## 2018-02-09 ENCOUNTER — Encounter: Payer: Self-pay | Admitting: Hematology

## 2018-02-09 VITALS — BP 176/66 | HR 62 | Temp 98.1°F | Resp 18 | Ht 62.0 in | Wt 213.4 lb

## 2018-02-09 DIAGNOSIS — G4089 Other seizures: Secondary | ICD-10-CM | POA: Insufficient documentation

## 2018-02-09 DIAGNOSIS — Z79899 Other long term (current) drug therapy: Secondary | ICD-10-CM | POA: Diagnosis not present

## 2018-02-09 DIAGNOSIS — D473 Essential (hemorrhagic) thrombocythemia: Secondary | ICD-10-CM | POA: Diagnosis present

## 2018-02-09 DIAGNOSIS — R55 Syncope and collapse: Secondary | ICD-10-CM | POA: Diagnosis not present

## 2018-02-09 DIAGNOSIS — I10 Essential (primary) hypertension: Secondary | ICD-10-CM | POA: Diagnosis not present

## 2018-02-09 DIAGNOSIS — K469 Unspecified abdominal hernia without obstruction or gangrene: Secondary | ICD-10-CM | POA: Diagnosis not present

## 2018-02-09 DIAGNOSIS — E119 Type 2 diabetes mellitus without complications: Secondary | ICD-10-CM

## 2018-02-09 LAB — COMPREHENSIVE METABOLIC PANEL
ALT: 23 U/L (ref 0–55)
AST: 20 U/L (ref 5–34)
Albumin: 3.6 g/dL (ref 3.5–5.0)
Alkaline Phosphatase: 85 U/L (ref 40–150)
Anion gap: 6 (ref 3–11)
BUN: 17 mg/dL (ref 7–26)
CHLORIDE: 107 mmol/L (ref 98–109)
CO2: 29 mmol/L (ref 22–29)
Calcium: 9.3 mg/dL (ref 8.4–10.4)
Creatinine, Ser: 1.08 mg/dL (ref 0.60–1.10)
GFR calc Af Amer: >60 mL/min (ref >60)
GFR, EST NON AFRICAN AMERICAN: 53 mL/min — AB (ref >60)
GLUCOSE: 77 mg/dL (ref 70–140)
POTASSIUM: 4.3 mmol/L (ref 3.5–5.1)
Sodium: 142 mmol/L (ref 136–145)
Total Bilirubin: 0.4 mg/dL (ref 0.2–1.2)
Total Protein: 6.8 g/dL (ref 6.4–8.3)

## 2018-02-09 LAB — CBC WITH DIFFERENTIAL/PLATELET
Basophils Absolute: 0 10*3/uL (ref 0.0–0.1)
Basophils Relative: 1 %
EOS ABS: 0.3 10*3/uL (ref 0.0–0.5)
EOS PCT: 5 %
HCT: 41 % (ref 34.8–46.6)
Hemoglobin: 13.3 g/dL (ref 11.6–15.9)
LYMPHS ABS: 2 10*3/uL (ref 0.9–3.3)
LYMPHS PCT: 33 %
MCH: 29 pg (ref 25.1–34.0)
MCHC: 32.4 g/dL (ref 31.5–36.0)
MCV: 89.5 fL (ref 79.5–101.0)
MONO ABS: 0.4 10*3/uL (ref 0.1–0.9)
MONOS PCT: 7 %
Neutro Abs: 3.3 10*3/uL (ref 1.5–6.5)
Neutrophils Relative %: 54 %
PLATELETS: 427 10*3/uL — AB (ref 145–400)
RBC: 4.58 MIL/uL (ref 3.70–5.45)
RDW: 14.3 % (ref 11.2–14.5)
WBC: 6 10*3/uL (ref 3.9–10.3)

## 2018-02-09 NOTE — Telephone Encounter (Signed)
Scheduled appt per 4/19 los - Gave patient aVS and calender per los .

## 2018-02-28 ENCOUNTER — Other Ambulatory Visit: Payer: Self-pay | Admitting: Cardiovascular Disease

## 2018-02-28 NOTE — Telephone Encounter (Signed)
Rx(s) sent to pharmacy electronically.  

## 2018-04-11 ENCOUNTER — Inpatient Hospital Stay: Payer: BC Managed Care – PPO | Attending: Hematology

## 2018-04-19 ENCOUNTER — Other Ambulatory Visit: Payer: Self-pay | Admitting: Hematology

## 2018-04-19 DIAGNOSIS — D473 Essential (hemorrhagic) thrombocythemia: Secondary | ICD-10-CM

## 2018-05-16 DIAGNOSIS — I2510 Atherosclerotic heart disease of native coronary artery without angina pectoris: Secondary | ICD-10-CM | POA: Insufficient documentation

## 2018-05-16 DIAGNOSIS — Z8679 Personal history of other diseases of the circulatory system: Secondary | ICD-10-CM | POA: Insufficient documentation

## 2018-05-16 NOTE — Progress Notes (Signed)
Cardiology Office Note    Date:  05/17/2018   ID:  Sara [name], DOB Aug 14, 1956, MRN [id number]  PCP:  [name], MD  Cardiologist:   Sanda Klein, MD   Chief Complaint  Patient presents with  . Follow-up    CAD, CHF    History of Present Illness:  Sara [name] is a 62 y.o. female with history of artery disease (NSTEMI April 15, 2017, LIMA to LAD and SVG to diagonal bypass surgery on April 21, 2017).  Mild ischemic cardiomyopathy (EF 45-50%), peripheral arterial disease (renal artery stenosis with stent 2012), morbid obesity, diabetes mellitus, hyperlipidemia, hypertension, obstructive sleep apnea on CPAP, essential thrombocythemia and absence seizures, returning for routine follow-up.  She is doing quite well in the years since her bypass surgery.  She is back to work full-time.  She is actually working 10-hour days over the summer months.  She does not really have time to exercise.  She is actually gained 4 pounds over the last 6 months and is now almost at the threshold of morbid obesity.  She is thinking of retiring, since her work schedule prevents her from exercising. She is compliant with CPAP and has good glycemic control.  Her blood pressure remains high, even though Dr. Baird Cancer increased her telmisartan dose to 80 mg daily about a month ago.  Dr. Inocente Salles is also recommended starting an injectable drug for diabetes which might help with weight loss, I presume she is talking about [place].  Last hemoglobin A1c was 6.3%.  She is on treatment for essential thrombocythemia, followed by Dr. Burr Medico  The patient specifically denies any chest pain at rest exertion, dyspnea at rest or with exertion, orthopnea, paroxysmal nocturnal dyspnea, syncope, palpitations, focal neurological deficits, intermittent claudication, lower extremity edema, unexplained weight gain, cough, hemoptysis or wheezing.  She denies daytime hypersomnolence.   Past Medical History:  Diagnosis Date  .  Diabetes mellitus   . Gout   . Hyperlipemia   . Hypertension   . LVH (left ventricular hypertrophy)    12/21/09 Echo -mild asymmetric LVH,EF =>55%  . Morbid obesity ([place])   . OSA (obstructive sleep apnea)   . PAD (peripheral artery disease) ([place])   . Renal artery stenosis ([place]) 12/13/2010   PTCA and Stent - right  . Seizures ([place])   . Syncopal episodes   . Thrombocytosis ([place])     Past Surgical History:  Procedure Laterality Date  . BREAST CYST EXCISION     S/P Benign Right Breast Cyst Removal.  . BREAST REDUCTION SURGERY  1995   S/P Bilateral breast reduction  . [place]   Times  Two.  . CORONARY ARTERY BYPASS GRAFT N/A 04/21/2017   Procedure: CORONARY ARTERY BYPASS GRAFTING (CABG) x 2, USING LEFT MAMMARY ARTERY AND RIGHT GREATER SAPHENOUS VEIN HARVESTED ENDOSCOPICALLY;  Surgeon: [name], MD;  Location: [facility];  Service: Open Heart Surgery;  Laterality: N/A;  . EXTREMITY CYST EXCISION  1993   left wrist  . LEFT HEART CATH AND CORONARY ANGIOGRAPHY N/A 04/17/2017   Procedure: Left Heart Cath and Coronary Angiography;  Surgeon: Jettie Booze, MD;  Location: [facility];  Service: Cardiovascular;  Laterality: N/A;  . PARTIAL HYSTERECTOMY  1994  . renal artery stent placement Right   . TEE WITHOUT CARDIOVERSION N/A 04/21/2017   Procedure: TRANSESOPHAGEAL ECHOCARDIOGRAM (TEE);  Surgeon: [name], MD;  Location: [facility];  Service: Open Heart Surgery;  Laterality: N/A;  Current Medications: Outpatient Medications Prior to Visit  Medication Sig Dispense Refill  . acetaminophen (TYLENOL) 325 MG tablet Take 2 tablets (650 mg total) by mouth every 6 (six) hours as needed for mild pain.    Marland Kitchen anagrelide ([name]) 1 MG capsule TAKE 1 CAPSULE BY MOUTH ONCE DAILY 30 capsule 0  . aspirin EC 325 MG EC tablet Take 1 tablet (325 mg total) by mouth daily. 30 tablet 0  . atorvastatin (LIPITOR) 40 MG tablet Take 1 tablet (40 mg total) by mouth daily at 6  PM. 30 tablet 11  . B Complex Vitamins (B COMPLEX PO) Take 1 tablet by mouth daily.     . Cholecalciferol (VITAMIN D3) 2000 UNITS capsule Take 2,000 Units by mouth daily.     . Metoprolol Tartrate 75 MG TABS Take 75 mg by mouth 2 (two) times daily. 30 tablet 6  . telmisartan (MICARDIS) 80 MG tablet Take 80 mg by mouth daily.    Marland Kitchen [name] 100 MG TABS 1/2 tablet in the morning and 1.5 tablets in the evening 180 tablet 3  . telmisartan (MICARDIS) 40 MG tablet TAKE 1 TABLET BY MOUTH ONCE DAILY 90 tablet 1   No facility-administered medications prior to visit.      Allergies:   Sulfa antibiotics   Social History   Socioeconomic History  . Marital status: Married    Spouse name: Juanda Crumble  . Number of children: 2  . Years of education: 14  . Highest education level: Not on file  Occupational History  . Occupation: TRANSPORTATION COOR.    Employer: [employer]  Social Needs  . Financial resource strain: Not on file  . Food insecurity:    Worry: Not on file    Inability: Not on file  . Transportation needs:    Medical: Not on file    Non-medical: Not on file  Tobacco Use  . Smoking status: Never Smoker  . Smokeless tobacco: Never Used  Substance and Sexual Activity  . Alcohol use: No    Alcohol/week: 0.0 oz  . Drug use: No  . Sexual activity: Yes  Lifestyle  . Physical activity:    Days per week: Not on file    Minutes per session: Not on file  . Stress: Not on file  Relationships  . Social connections:    Talks on phone: Not on file    Gets together: Not on file    Attends religious service: Not on file    Active member of club or organization: Not on file    Attends meetings of clubs or organizations: Not on file    Relationship status: Not on file  Other Topics Concern  . Not on file  Social History Narrative   Patient lives at husband and her son, home with family.   Patient has two adult children.   Patient is not drinking any caffeine.   Patient is  working full-time.   Patient has a college education.   Patient is right-handed.     Family History:  The patient's family history includes Breast cancer in her maternal aunt and maternal grandmother; Diabetes in her father and sister; Heart failure in her mother; Hypertension in her sister, sister, and sister.   ROS:   Please see the history of present illness.     All other systems are reviewed and are negative   PHYSICAL EXAM:   VS:  BP (!) 150/90   Pulse 66   Ht 5\' 2"  (1.575  m)   Wt 217 lb (98.4 kg)   BMI 39.69 kg/m     Recheck blood pressure was still high at 148/86 mmHg General: Alert, oriented x3, no distress, severely obese Head: no evidence of trauma, PERRL, EOMI, no exophtalmos or lid lag, no myxedema, no xanthelasma; normal ears, nose and oropharynx Neck: normal jugular venous pulsations and no hepatojugular reflux; brisk carotid pulses without delay and no carotid bruits Chest: clear to auscultation, no signs of consolidation by percussion or palpation, normal fremitus, symmetrical and full respiratory excursions.Keloid scar formation at the sternotomy site Cardiovascular: normal position and quality of the apical impulse, regular rhythm, normal first and second heart sounds, no murmurs, rubs or gallops Abdomen: no tenderness or distention, no masses by palpation, no abnormal pulsatility or arterial bruits, normal bowel sounds, no hepatosplenomegaly Extremities: no clubbing, cyanosis or edema; 2+ radial, ulnar and brachial pulses bilaterally; 2+ right femoral, posterior tibial and dorsalis pedis pulses; 2+ left femoral, posterior tibial and dorsalis pedis pulses; no subclavian or femoral bruits Neurological: grossly nonfocal Psych: Normal mood and affect    Wt Readings from Last 3 Encounters:  05/17/18 217 lb (98.4 kg)  02/09/18 213 lb 6.4 oz (96.8 kg)  01/18/18 211 lb 8 oz (95.9 kg)      Studies/Labs Reviewed:   EKG:  EKG is ordered today.  It shows sinus rhythm,  QS pattern in leads V1-V2, normal repolarization pattern  Recent Labs: 02/09/2018: ALT 23; BUN 17; Creatinine, Ser 1.08; Hemoglobin 13.3; Platelets 427; Potassium 4.3; Sodium 142  Hemoglobin A1c 6.3% on April 23 TSH 2.83  Lipids performed April 2019 total cholesterol 110, HDL 45, LDL 56, triglycerides 47   ASSESSMENT:    1. Chronic diastolic congestive heart failure ([place])   2. Coronary artery disease involving native coronary artery of native heart without angina pectoris   3. Essential hypertension   4. OSA (obstructive sleep apnea)   5. Hypercholesterolemia   6. Morbid obesity ([place])   7. History of renal artery stenosis   8. Essential thrombocythemia ([place])      PLAN:  In order of problems listed above:  1. CHF: NYHA functional class I, clinically euvolemic without the need for loop diuretics. 2. CAD: 1 year status post two-vessel bypass surgery with LIMA to LAD and SVG to diagonal, asymptomatic. 3. HTN: Blood pressure continues to be difficult to control despite maximum dose telmisartan as well as beta-blocker.  We will add a low-dose of thiazide diuretic.  She has a follow-up appoint with Dr. Baird Cancer.  Target blood pressure less than 130/80 4. OSA: she is compliant with CPAP and reports clinical benefit 5. HLP: Excellent lipid profile.  Last LDL cholesterol 56, HDL 45. 6. Obesity: She is at the borderline morbid obesity.  She has multiple health problems related to this including diabetes, sleep apnea and vascular problems.  Strongly recommend regular physical exercise as a critical part of her attempts at weight loss.  Discussed healthy dietary changes. 7. History of renal artery stenosis/stent: Plan duplex ultrasound of her renal arteries if we cannot control her blood pressure with addition of a diuretic. 8. Essential thrombocythemia: On anagrelide and full dose aspirin.  No bleeding problems requiring events.    Medication Adjustments/Labs and Tests Ordered: Current  medicines are reviewed at length with the patient today.  Concerns regarding medicines are outlined above.  Medication changes, Labs and Tests ordered today are listed in the Patient Instructions below. Patient Instructions  Medication Instructions:  Your physician has recommended  you make the following change in your medication:  1) START Hydrochlorothiazide 12.5mg  tablet by mouth ONCE daily   Labwork: none  Testing/Procedures: none  Follow-Up: Your physician wants you to follow-up in: 12 months with Dr. Sallyanne Kuster. You will receive a reminder letter in the mail two months in advance. If you don't receive a letter, please call our office to schedule the follow-up appointment.   Any Other Special Instructions Will Be Listed Below (If Applicable).     If you need a refill on your cardiac medications before your next appointment, please call your pharmacy.      Signed, Sanda Klein, MD  05/17/2018 9:22 AM    [facility] [place], [place], [place]  [address] Phone: [phone]; Fax: [phone]

## 2018-05-17 ENCOUNTER — Other Ambulatory Visit: Payer: Self-pay | Admitting: Hematology

## 2018-05-17 ENCOUNTER — Ambulatory Visit: Payer: BC Managed Care – PPO | Admitting: Cardiovascular Disease

## 2018-05-17 ENCOUNTER — Encounter: Payer: Self-pay | Admitting: Cardiovascular Disease

## 2018-05-17 VITALS — BP 150/90 | HR 66 | Ht 62.0 in | Wt 217.0 lb

## 2018-05-17 DIAGNOSIS — G4733 Obstructive sleep apnea (adult) (pediatric): Secondary | ICD-10-CM | POA: Diagnosis not present

## 2018-05-17 DIAGNOSIS — I10 Essential (primary) hypertension: Secondary | ICD-10-CM | POA: Diagnosis not present

## 2018-05-17 DIAGNOSIS — D473 Essential (hemorrhagic) thrombocythemia: Secondary | ICD-10-CM

## 2018-05-17 DIAGNOSIS — I5032 Chronic diastolic (congestive) heart failure: Secondary | ICD-10-CM

## 2018-05-17 DIAGNOSIS — Z8679 Personal history of other diseases of the circulatory system: Secondary | ICD-10-CM

## 2018-05-17 DIAGNOSIS — E7800 Pure hypercholesterolemia, unspecified: Secondary | ICD-10-CM | POA: Diagnosis not present

## 2018-05-17 DIAGNOSIS — I2510 Atherosclerotic heart disease of native coronary artery without angina pectoris: Secondary | ICD-10-CM | POA: Diagnosis not present

## 2018-05-17 MED ORDER — HYDROCHLOROTHIAZIDE 12.5 MG PO CAPS: 12.5000 mg | ORAL_CAPSULE | Freq: Every day | ORAL | 2 refills | Status: DC

## 2018-05-17 NOTE — Patient Instructions (Signed)
Medication Instructions:  Your physician has recommended you make the following change in your medication:  1) START Hydrochlorothiazide 12.5mg  tablet by mouth ONCE daily   Labwork: none  Testing/Procedures: none  Follow-Up: Your physician wants you to follow-up in: 12 months with Dr. Sallyanne Kuster. You will receive a reminder letter in the mail two months in advance. If you don't receive a letter, please call our office to schedule the follow-up appointment.   Any Other Special Instructions Will Be Listed Below (If Applicable).     If you need a refill on your cardiac medications before your next appointment, please call your pharmacy.

## 2018-05-18 ENCOUNTER — Other Ambulatory Visit: Payer: Self-pay | Admitting: Physician Assistant

## 2018-05-18 NOTE — Telephone Encounter (Signed)
Rx request sent to pharmacy.  

## 2018-05-18 NOTE — Telephone Encounter (Signed)
This is Dr. Croitoru's pt 

## 2018-06-15 ENCOUNTER — Other Ambulatory Visit: Payer: Self-pay | Admitting: Emergency Medicine

## 2018-06-15 ENCOUNTER — Other Ambulatory Visit: Payer: Self-pay | Admitting: Nurse Practitioner

## 2018-06-15 ENCOUNTER — Other Ambulatory Visit: Payer: Self-pay | Admitting: Cardiovascular Disease

## 2018-06-15 DIAGNOSIS — D473 Essential (hemorrhagic) thrombocythemia: Secondary | ICD-10-CM

## 2018-06-15 MED ORDER — ANAGRELIDE HCL 1 MG PO CAPS: 1.0000 mg | ORAL_CAPSULE | Freq: Every day | ORAL | 2 refills | Status: DC

## 2018-06-21 LAB — BASIC METABOLIC PANEL
BUN: 14 (ref 4–21)
CREATININE: 1.2 — AB (ref 0.5–1.1)
Glucose: 94
Potassium: 4.2 (ref 3.4–5.3)
SODIUM: 139 (ref 137–147)

## 2018-06-21 LAB — HEMOGLOBIN A1C: HEMOGLOBIN A1C: 6.4 — AB (ref 4.0–6.0)

## 2018-07-14 ENCOUNTER — Other Ambulatory Visit: Payer: Self-pay | Admitting: Cardiovascular Disease

## 2018-07-15 ENCOUNTER — Encounter: Payer: Self-pay | Admitting: Adult Health

## 2018-07-17 ENCOUNTER — Ambulatory Visit: Payer: BC Managed Care – PPO | Admitting: Adult Health

## 2018-07-17 ENCOUNTER — Encounter: Payer: Self-pay | Admitting: Adult Health

## 2018-07-17 VITALS — BP 148/84 | HR 79 | Ht 65.0 in | Wt 216.0 lb

## 2018-07-17 DIAGNOSIS — Z9989 Dependence on other enabling machines and devices: Secondary | ICD-10-CM

## 2018-07-17 DIAGNOSIS — G4733 Obstructive sleep apnea (adult) (pediatric): Secondary | ICD-10-CM | POA: Diagnosis not present

## 2018-07-17 DIAGNOSIS — R569 Unspecified convulsions: Secondary | ICD-10-CM

## 2018-07-17 MED ORDER — VIMPAT 100 MG PO TABS: ORAL_TABLET | ORAL | 3 refills | Status: DC

## 2018-07-17 NOTE — Progress Notes (Signed)
PATIENT: Sara [name] DOB: 24-Jul-1956  REASON FOR VISIT: follow up HISTORY FROM: patient  HISTORY OF PRESENT ILLNESS: Today 07/17/18 [name] is a 62 year old female with a history of seizures and obstructive sleep apnea on CPAP.  The patient CPAP download indicates that she use her machine every night for compliance of 100%.  Each night she use her machine greater than 4 hours.  On average she uses her machine 8 hours and 37 minutes.  Her residual AHI is 1.7 on 9 cm of water with EPR of 2.  She reports that she has not had any seizure events.  She continues taking [name] 10 mg in the morning and 150 mg at bedtime.  He operates a Teacher, music without difficulty.  She continues to work full-time but is considering retiring.  He returns today for evaluation.  HISTORY Ms. Tusing is a 62 year old right-handed black female with a history of seizures as well controlled currently on [name].  The patient takes 50 mg in the morning and 150 mg in the evening and she is tolerating the medication quite well.  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  She indicates that she will go to bed around 9 PM but she will wake up around 2 AM and have difficulty getting back to sleep for a couple hours, she gets up at 6:00 in the morning to go to work.  She feels rested during the day, she does not feel that she is low on energy.   REVIEW OF SYSTEMS: Out of a complete 14 system review of symptoms, the patient complains only of the following symptoms, and all other reviewed systems are negative.  Epworth sleepiness score 2  ALLERGIES: Allergies  Allergen Reactions  . Sulfa Antibiotics Itching    HOME MEDICATIONS: Outpatient Medications Prior to Visit  Medication Sig Dispense Refill  . acetaminophen (TYLENOL) 325 MG tablet Take 2 tablets (650 mg total) by mouth every 6 (six) hours as needed for mild pain.    Marland Kitchen anagrelide ([name]) 1 MG capsule Take 1 capsule (1 mg  total) by mouth daily. 30 capsule 2  . aspirin EC 325 MG EC tablet Take 1 tablet (325 mg total) by mouth daily. 30 tablet 0  . atorvastatin (LIPITOR) 40 MG tablet TAKE 1 TABLET BY MOUTH DAILY AT 6 PM 30 tablet 0  . B Complex Vitamins (B COMPLEX PO) Take 1 tablet by mouth daily.     . Cholecalciferol (VITAMIN D3) 2000 UNITS capsule Take 2,000 Units by mouth daily.     . hydrochlorothiazide (MICROZIDE) 12.5 MG capsule Take 1 capsule (12.5 mg total) by mouth daily. 30 capsule 2  . metoprolol tartrate (LOPRESSOR) 50 MG tablet TAKE 1 AND 1/2 TABLETS BY MOUTH TWICE A DAY 90 tablet 5  . OZEMPIC, 0.25 OR 0.5 MG/DOSE, 2 MG/1.5ML SOPN Inject 0.25 mg into the skin once a week.  1  . telmisartan (MICARDIS) 80 MG tablet Take 80 mg by mouth daily.    Marland Kitchen [name] 100 MG TABS 1/2 tablet in the morning and 1.5 tablets in the evening 180 tablet 3  . Metoprolol Tartrate 75 MG TABS Take 75 mg by mouth 2 (two) times daily. 30 tablet 6   No facility-administered medications prior to visit.     PAST MEDICAL HISTORY: Past Medical History:  Diagnosis Date  . Diabetes mellitus   . Gout   . Hyperlipemia   . Hypertension   . LVH (  left ventricular hypertrophy)    12/21/09 Echo -mild asymmetric LVH,EF =>55%  . Morbid obesity ([place])   . OSA (obstructive sleep apnea)   . PAD (peripheral artery disease) ([place])   . Renal artery stenosis ([place]) 12/13/2010   PTCA and Stent - right  . Seizures ([place])   . Syncopal episodes   . Thrombocytosis ([place])     PAST SURGICAL HISTORY: Past Surgical History:  Procedure Laterality Date  . BREAST CYST EXCISION     S/P Benign Right Breast Cyst Removal.  . BREAST REDUCTION SURGERY  1995   S/P Bilateral breast reduction  . [place]   Times  Two.  . CORONARY ARTERY BYPASS GRAFT N/A 04/21/2017   Procedure: CORONARY ARTERY BYPASS GRAFTING (CABG) x 2, USING LEFT MAMMARY ARTERY AND RIGHT GREATER SAPHENOUS VEIN HARVESTED ENDOSCOPICALLY;  Surgeon: [name], MD;   Location: [facility];  Service: Open Heart Surgery;  Laterality: N/A;  . EXTREMITY CYST EXCISION  1993   left wrist  . LEFT HEART CATH AND CORONARY ANGIOGRAPHY N/A 04/17/2017   Procedure: Left Heart Cath and Coronary Angiography;  Surgeon: Jettie Booze, MD;  Location: [facility];  Service: Cardiovascular;  Laterality: N/A;  . PARTIAL HYSTERECTOMY  1994  . renal artery stent placement Right   . TEE WITHOUT CARDIOVERSION N/A 04/21/2017   Procedure: TRANSESOPHAGEAL ECHOCARDIOGRAM (TEE);  Surgeon: [name], MD;  Location: [facility];  Service: Open Heart Surgery;  Laterality: N/A;    FAMILY HISTORY: Family History  Problem Relation Age of Onset  . Heart failure Mother   . Diabetes Father   . Diabetes Sister   . Hypertension Sister   . Hypertension Sister   . Hypertension Sister   . Breast cancer Maternal Aunt        2 maternal aunts had breast cancer.  . Breast cancer Maternal Grandmother     SOCIAL HISTORY: Social History   Socioeconomic History  . Marital status: Married    Spouse name: Juanda Crumble  . Number of children: 2  . Years of education: 40  . Highest education level: Not on file  Occupational History  . Occupation: TRANSPORTATION COOR.    Employer: [employer]  Social Needs  . Financial resource strain: Not on file  . Food insecurity:    Worry: Not on file    Inability: Not on file  . Transportation needs:    Medical: Not on file    Non-medical: Not on file  Tobacco Use  . Smoking status: Never Smoker  . Smokeless tobacco: Never Used  Substance and Sexual Activity  . Alcohol use: No    Alcohol/week: 0.0 standard drinks  . Drug use: No  . Sexual activity: Yes  Lifestyle  . Physical activity:    Days per week: Not on file    Minutes per session: Not on file  . Stress: Not on file  Relationships  . Social connections:    Talks on phone: Not on file    Gets together: Not on file    Attends religious service: Not on file    Active member  of club or organization: Not on file    Attends meetings of clubs or organizations: Not on file    Relationship status: Not on file  . Intimate partner violence:    Fear of current or ex partner: Not on file    Emotionally abused: Not on file    Physically abused: Not on file  Forced sexual activity: Not on file  Other Topics Concern  . Not on file  Social History Narrative   Patient lives at husband and her son, home with family.   Patient has two adult children.   Patient is not drinking any caffeine.   Patient is working full-time.   Patient has a college education.   Patient is right-handed.      PHYSICAL EXAM  Vitals:   07/17/18 0731  BP: (!) 148/84  Pulse: 79  Weight: 216 lb (98 kg)  Height: 5\' 5"  (1.651 m)   Body mass index is 35.94 kg/m.  Generalized: Well developed, in no acute distress   Neurological examination  Mentation: Alert oriented to time, place, history taking. Follows all commands speech and language fluent Cranial nerve II-XII: Pupils were equal round reactive to light. Extraocular movements were full, visual field were full on confrontational test. Facial sensation and strength were normal. Uvula tongue midline. Head turning and shoulder shrug  were normal and symmetric. Motor: The motor testing reveals 5 over 5 strength of all 4 extremities. Good symmetric motor tone is noted throughout.  Sensory: Sensory testing is intact to soft touch on all 4 extremities. No evidence of extinction is noted.  Coordination: Cerebellar testing reveals good finger-nose-finger and heel-to-shin bilaterally.  Gait and station: Gait is normal. Tandem gait is normal. Romberg is negative. No drift is seen.  Reflexes: Deep tendon reflexes are symmetric and normal bilaterally.   DIAGNOSTIC DATA (LABS, IMAGING, TESTING) - I reviewed patient records, labs, notes, testing and imaging myself where available.  Lab Results  Component Value Date   WBC 6.0 02/09/2018   HGB  13.3 02/09/2018   HCT 41.0 02/09/2018   MCV 89.5 02/09/2018   PLT 427 (H) 02/09/2018      Component Value Date/Time   NA 142 02/09/2018 1455   NA 139 08/11/2017 1528   K 4.3 02/09/2018 1455   K 4.2 08/11/2017 1528   CL 107 02/09/2018 1455   CL 102 03/19/2013 1535   CO2 29 02/09/2018 1455   CO2 27 08/11/2017 1528   GLUCOSE 77 02/09/2018 1455   GLUCOSE 93 08/11/2017 1528   GLUCOSE 117 (H) 03/19/2013 1535   BUN 17 02/09/2018 1455   BUN 15.0 08/11/2017 1528   CREATININE 1.08 02/09/2018 1455   CREATININE 1.1 08/11/2017 1528   CALCIUM 9.3 02/09/2018 1455   CALCIUM 9.2 08/11/2017 1528   PROT 6.8 02/09/2018 1455   PROT 7.0 08/11/2017 1528   ALBUMIN 3.6 02/09/2018 1455   ALBUMIN 3.6 08/11/2017 1528   AST 20 02/09/2018 1455   AST 27 08/11/2017 1528   ALT 23 02/09/2018 1455   ALT 26 08/11/2017 1528   ALKPHOS 85 02/09/2018 1455   ALKPHOS 88 08/11/2017 1528   BILITOT 0.4 02/09/2018 1455   BILITOT 0.37 08/11/2017 1528   GFRNONAA 53 (L) 02/09/2018 1455   GFRAA >60 02/09/2018 1455   Lab Results  Component Value Date   CHOL 86 (L) 06/05/2017   HDL 37 (L) 06/05/2017   LDLCALC 39 06/05/2017   TRIG 48 06/05/2017   CHOLHDL 2.3 06/05/2017   Lab Results  Component Value Date   HGBA1C 6.6 (H) 04/16/2017   No results found for: [id number] Lab Results  Component Value Date   TSH 2.839 07/21/2013      ASSESSMENT AND PLAN 62 y.o. year old female  has a past medical history of Diabetes mellitus, Gout, Hyperlipemia, Hypertension, LVH (left ventricular hypertrophy), Morbid obesity ([place]), OSA (obstructive  sleep apnea), PAD (peripheral artery disease) ([place]), Renal artery stenosis ([place]) (12/13/2010), Seizures ([place]), Syncopal episodes, and Thrombocytosis ([place]). here with:  1.  Seizures 2.  Obstructive sleep apnea on CPAP  The patient CPAP download shows excellent compliance and good treatment of her apnea.  She is encouraged to continue using the CPAP nightly.  She will remain on [name]  50 mg in the morning and 150 mg at bedtime.  She is advised that if her symptoms worsen or she develops new symptoms she should let us know.  She will follow-up in 1 year or sooner if needed.     Ward Givens, MSN, NP-C 07/17/2018, 7:39 AM [facility] [address] [place], [place] [address] [phone]

## 2018-07-17 NOTE — Progress Notes (Signed)
I have read the note, and I agree with the clinical assessment and plan.  Sara [name]   

## 2018-07-17 NOTE — Patient Instructions (Signed)
Your Plan:  Continue using CPAP nightly  Continue [name]  If your symptoms worsen or you develop new symptoms please let us know.   Thank you for coming to see [place] at [facility]. I hope we have been able to provide you high quality care today.  You may receive a patient satisfaction survey over the next few weeks. We would appreciate your feedback and comments so that we may continue to improve ourselves and the health of our patients.

## 2018-07-18 ENCOUNTER — Other Ambulatory Visit: Payer: Self-pay | Admitting: Cardiovascular Disease

## 2018-07-25 ENCOUNTER — Other Ambulatory Visit: Payer: Self-pay | Admitting: Internal Medicine

## 2018-07-28 ENCOUNTER — Encounter: Payer: Self-pay | Admitting: Internal Medicine

## 2018-07-28 DIAGNOSIS — N182 Chronic kidney disease, stage 2 (mild): Secondary | ICD-10-CM | POA: Insufficient documentation

## 2018-07-28 DIAGNOSIS — N289 Disorder of kidney and ureter, unspecified: Secondary | ICD-10-CM

## 2018-08-08 ENCOUNTER — Telehealth: Payer: Self-pay | Admitting: Hematology

## 2018-08-08 NOTE — Telephone Encounter (Signed)
Apps r/s per patient new date/time ok per 10/16 sch msg

## 2018-08-09 ENCOUNTER — Other Ambulatory Visit: Payer: Self-pay

## 2018-08-09 ENCOUNTER — Ambulatory Visit: Payer: Self-pay | Admitting: Hematology

## 2018-08-14 ENCOUNTER — Other Ambulatory Visit: Payer: Self-pay | Admitting: Cardiovascular Disease

## 2018-08-17 NOTE — Progress Notes (Signed)
[facility] OFFICE PROGRESS NOTE  [name], [address] [place] [id number]  DIAGNOSIS: Essential thrombocythemia [facility]) - Plan: anagrelide ([name]) 1 MG capsule  CC: follow up Essential Thrombocytosis  CURRENT THERAPY:  -Hydroxyurea 500 mg daily, started in 2010., increased to hydrea 1000mg  daily on Monday and Thursday, and 500mg  daily for the rest of week in 01/2015,  Aspirin 81 mg daily.  -Switched Hydrea to anagrelide 1 mg daily 07/2016  INTERVAL HISTORY:   Sara [name] 62 y.o. female with history of JAK positive Essential thrombocythemia is here for follow up. She was last seen by me 6 months ago. She has been following up with cardiology, neurology, and gerontology in the interim. Today, she is here with her husband. She is doing well and she states that she was started on a new diabetic medication and states that she is tolerating well. She takes one Tylenol pill and says that her pain has improved. She denies lower limb edema. She denies dizziness or falling.   MEDICAL HISTORY: Past Medical History:  Diagnosis Date  . Diabetes mellitus   . Gout   . Hyperlipemia   . Hypertension   . LVH (left ventricular hypertrophy)    12/21/09 Echo -mild asymmetric LVH,EF =>55%  . Morbid obesity ([place])   . OSA (obstructive sleep apnea)   . PAD (peripheral artery disease) ([place])   . Renal artery stenosis ([place]) 12/13/2010   PTCA and Stent - right  . Seizures ([place])   . Syncopal episodes   . Thrombocytosis ([facility])      ALLERGIES:  is allergic to sulfa antibiotics.  MEDICATIONS:  Allergies as of 08/21/2018      Reactions   Sulfa Antibiotics Itching      Medication List        Accurate as of 08/21/18 10:18 PM. Always use your most recent med list.          acetaminophen 325 MG tablet Commonly known as:  TYLENOL Take 2 tablets (650 mg total) by mouth every 6 (six) hours as needed for mild pain.   anagrelide 1 MG capsule Commonly  known as:  [name] Take 1 capsule (1 mg total) by mouth daily.   aspirin 325 MG EC tablet Take 1 tablet (325 mg total) by mouth daily.   atorvastatin 40 MG tablet Commonly known as:  LIPITOR TAKE 1 TABLET BY MOUTH DAILY AT 6 PM   B COMPLEX PO Take 1 tablet by mouth daily.   hydrochlorothiazide 12.5 MG capsule Commonly known as:  MICROZIDE TAKE 1 CAPSULE(12.5 MG) BY MOUTH DAILY   metoprolol tartrate 50 MG tablet Commonly known as:  LOPRESSOR TAKE 1 AND 1/2 TABLETS BY MOUTH TWICE A DAY   OZEMPIC (0.25 OR 0.5 MG/DOSE) 2 MG/1.5ML Sopn Generic drug:  Semaglutide(0.25 or 0.5MG /DOS) Inject 0.25 mg into the skin once a week.   telmisartan 80 MG tablet Commonly known as:  MICARDIS TAKE 1 TABLET BY MOUTH EVERY DAY   [name] 100 MG Tabs Generic drug:  Lacosamide 1/2 tablet in the morning and 1.5 tablets in the evening   Vitamin D3 2000 units capsule Take 2,000 Units by mouth daily.       SURGICAL HISTORY:  Past Surgical History:  Procedure Laterality Date  . BREAST CYST EXCISION     S/P Benign Right Breast Cyst Removal.  . BREAST REDUCTION SURGERY  1995   S/P Bilateral breast reduction  . [place]  Times  Two.  . CORONARY ARTERY BYPASS GRAFT N/A 04/21/2017   Procedure: CORONARY ARTERY BYPASS GRAFTING (CABG) x 2, USING LEFT MAMMARY ARTERY AND RIGHT GREATER SAPHENOUS VEIN HARVESTED ENDOSCOPICALLY;  Surgeon: [name], MD;  Location: [facility];  Service: Open Heart Surgery;  Laterality: N/A;  . EXTREMITY CYST EXCISION  1993   left wrist  . LEFT HEART CATH AND CORONARY ANGIOGRAPHY N/A 04/17/2017   Procedure: Left Heart Cath and Coronary Angiography;  Surgeon: Jettie Booze, MD;  Location: [facility];  Service: Cardiovascular;  Laterality: N/A;  . PARTIAL HYSTERECTOMY  1994  . renal artery stent placement Right   . TEE WITHOUT CARDIOVERSION N/A 04/21/2017   Procedure: TRANSESOPHAGEAL ECHOCARDIOGRAM (TEE);  Surgeon: [name], MD;   Location: [facility];  Service: Open Heart Surgery;  Laterality: N/A;    REVIEW OF SYSTEMS:   Constitutional: Denies fevers, chills or abnormal weight loss Eyes: Denies blurriness of vision Ears, nose, mouth, throat, and face: Denies mucositis or sore throat Respiratory: Denies cough, dyspnea or wheezes Cardiovascular: Denies palpitation, chest discomfort or lower extremity swelling Gastrointestinal:  Denies nausea, heartburn or changes in BMs Skin: Denies abnormal skin rashes Lymphatics: Denies new lymphadenopathy or easy bruising Neurological:Denies numbness, tingling or new weaknesses.  Behavioral/Psych: Mood is stable, no new changes  All other systems were reviewed with the patient and are negative.  PHYSICAL EXAMINATION: ECOG PERFORMANCE STATUS: 0  Blood pressure (!) 143/75, pulse 78, temperature 98.6 F (37 C), temperature source Oral, resp. rate 18, height 5' 1.5" (1.562 m), weight 211 lb 6.4 oz (95.9 kg), SpO2 98 %.  GENERAL:alert, no distress and comfortable; obese pleasant middle-age woman SKIN: skin color, texture, turgor are normal, no rashes or significant lesions EYES: normal, Conjunctiva are pink and non-injected, sclera clear OROPHARYNX:no exudate, no erythema and lips, buccal mucosa, and tongue normal  NECK: supple, thyroid normal size, non-tender, without nodularity LYMPH:  no palpable lymphadenopathy in the cervical, axillary or inguinal LUNGS: clear to auscultation and percussion with normal breathing effort HEART: regular rate & rhythm and no murmurs and no lower extremity edema ABDOMEN:abdomen soft, non-tender,normal bowel sounds. (+) abdominal wall protruding above the umbilical area. No splenomegaly Musculoskeletal:no cyanosis of digits and no clubbing  NEURO: alert & oriented x 3 with fluent speech, no focal motor/sensory deficits   LABORATORY DATA:  CBC Latest Ref Rng & Units 08/21/2018 02/09/2018 10/11/2017  WBC 4.0 - 10.5 K/uL 7.7 6.0 5.9  Hemoglobin 12.0  - 15.0 g/dL 13.9 13.3 13.9  Hematocrit 36.0 - 46.0 % 42.2 41.0 42.0  Platelets 150 - 400 K/uL 369 427(H) 373    CMP Latest Ref Rng & Units 08/21/2018 06/21/2018 02/09/2018  Glucose 70 - 99 mg/dL 87 - 77  BUN 8 - 23 mg/dL 16 14 17   Creatinine 0.44 - 1.00 mg/dL 1.26(H) 1.2(A) 1.08  Sodium 135 - 145 mmol/L 141 139 142  Potassium 3.5 - 5.1 mmol/L 3.8 4.2 4.3  Chloride 98 - 111 mmol/L 103 - 107  CO2 22 - 32 mmol/L 30 - 29  Calcium 8.9 - 10.3 mg/dL 9.6 - 9.3  Total Protein 6.5 - 8.1 g/dL 7.0 - 6.8  Total Bilirubin 0.3 - 1.2 mg/dL 0.6 - 0.4  Alkaline Phos 38 - 126 U/L 75 - 85  AST 15 - 41 U/L 24 - 20  ALT 0 - 44 U/L 26 - 23   Pathology report 01/20/2009 BONE MARROW, RIGHT POSTERIOR ILIAC CREST, BIOPSY AND ASPIRATION: - CELLULAR MARROW WITH  TRILINEAGE HEMATOPOIESIS AND MATURATION. - NO EVIDENCE OF EXCESS BLASTS.   BONE MARROW ASPIRATE: The aspirate material is composed of a few somewhat cellular bone marrow particles displaying a mixture of cell types. Erythroid and granulocytic series show orderly and progressive maturation. Megakaryocytes are abundant with predominantly normal morphology.  BONE MARROW CORE BIOPSY: The bone marrow core biopsy has very prominent aspiration artifact. Sections show 40 to 60% cellularity with a mixture of cell types. The megakaryocytes are abundant with normal morphology. No megakaryocyte clustering is seen. The granulocytic series and erythroid series show orderly and progressive maturation. No excess blasts are seen.  Karyotype: 46,XX[20].nuc ish (ABL,BCR)x2  Interpretation: Cytogenetic Analysis:Normal: Cytogenetic analysis revealed the presence of normal female chromosomes with no observable clonal chromosomal abnormalities.  Molecular Cytogenetic Analysis FISH: Normal: The investigative technique of molecular cytogenetic analysis with DNA probes specific for the Major/Minor ABL (9q34) and BCR (22q11) genes revealed the  lack of significant number of BCR/ABL fusion signals. These results are consistent with a normal finding and a lack of the BCR/ABL fusion associated with CML/ALL.  RADIOGRAPHIC STUDIES:  CT Abdomen without Contrast 12/30/15 IMPRESSION: 1. Midline supraumbilical ventral hernia contains unobstructed colon. 2. Two smaller fat containing periumbilical hernias.  ASSESSMENT:  Essential thrombocythemia. Diagnosed in 2010. JAK2 mutation was present. The patient underwent a bone marrow aspirate and biopsy on 01/20/2009 which yielded a specimen that was suboptimal for evaluation. However, megakaryocytes were abundant with normal morphology. There was no clustering or excess blasts seen. Platelet count on 01/20/2009 was 881. The patient is being treated with aspirin and Anagrelide.   PLAN:  1. Essential Thrombocythemia. JAK2 (+)  -I previously reviewed her above laboratory results and history extensively. I explained to her this chronic disease, not curable but treatable. Small percentage patient will develop myelofibrosis or leukemia in late stage. The disease related risk is thrombosis, including stroke and heart attack. -Her platelet count has been well controlled  -Her previous syncope and seizure likely activities are probably not related to hydrea, also I cannot rule out completely. -The patient was concerned about long-term side effects from [facility], especially risk of leukemia. -as such, I previously switched her Hydrea to anagrelide (October 2017), she is tolerating very well, her platelet counts have been normal. Will continue anagrelide -I previously discussed anagrelide can increase her risk of cardiac issues, although I don't know how much it contributed to her heart attack in 03/2017. She does have HTN, DM, obesity etc other significant cardiovascular disease risk factors. If she has further active carciac issues, will probably switch her back to [facility]  -Labs reviewed today, her CBC is WNLs.  CMP pending.  - follow up in 6 months   2. Seizure -continue meds, follow up with her neurologist.  3. Syncope episode -Unclear etiology. -I suspected the possibility of her syncope is probably not related to medications. -She denied dizziness or syncope on today's visit.   4. Diabetes -She has been seeing a nutritionist at church. -She monitors her sugar level regularly -She has intentionally lost about 17 lbs lately   5. HTN -continue meds and f/u   6. Coronary artery disease -Coronary artery disease status post bypass surgery grafting x 2 by Dr. Cyndia Bent in June 2018. -She completed cardiac rehab  7. Health Maintenance -She is due for a mammogram, she agrees to get it done this year  8. Abdominal wall hernia -She has a hernia in the abdominal wall just above the umbilical area. I advised her to avoid heavy  lifting or strenuous coughing.   PLAN: -labs in 3 months -f/u in 6 months -Continue anagrelide 1 mg daily   All questions were answered. The patient knows to call the clinic with any problems, questions or concerns. We can certainly see the patient much sooner if necessary.  I spent 15 minutes counseling the patient face to face. The total time spent in the appointment was 20 minutes.  Dierdre Searles Dweik am acting as scribe for Dr. Truitt Merle.  I have reviewed the above documentation for accuracy and completeness, and I agree with the above.    Truitt Merle  08/21/2018

## 2018-08-20 ENCOUNTER — Other Ambulatory Visit: Payer: Self-pay

## 2018-08-20 ENCOUNTER — Ambulatory Visit: Payer: Self-pay | Admitting: Hematology

## 2018-08-21 ENCOUNTER — Inpatient Hospital Stay (HOSPITAL_BASED_OUTPATIENT_CLINIC_OR_DEPARTMENT_OTHER): Payer: BC Managed Care – PPO | Admitting: Hematology

## 2018-08-21 ENCOUNTER — Telehealth: Payer: Self-pay | Admitting: Hematology

## 2018-08-21 ENCOUNTER — Encounter: Payer: Self-pay | Admitting: Hematology

## 2018-08-21 ENCOUNTER — Inpatient Hospital Stay: Payer: BC Managed Care – PPO | Attending: Hematology

## 2018-08-21 VITALS — BP 143/75 | HR 78 | Temp 98.6°F | Resp 18 | Ht 61.5 in | Wt 211.4 lb

## 2018-08-21 DIAGNOSIS — D473 Essential (hemorrhagic) thrombocythemia: Secondary | ICD-10-CM

## 2018-08-21 DIAGNOSIS — I10 Essential (primary) hypertension: Secondary | ICD-10-CM | POA: Insufficient documentation

## 2018-08-21 DIAGNOSIS — E669 Obesity, unspecified: Secondary | ICD-10-CM

## 2018-08-21 DIAGNOSIS — I2510 Atherosclerotic heart disease of native coronary artery without angina pectoris: Secondary | ICD-10-CM

## 2018-08-21 DIAGNOSIS — E119 Type 2 diabetes mellitus without complications: Secondary | ICD-10-CM | POA: Diagnosis not present

## 2018-08-21 DIAGNOSIS — Z79899 Other long term (current) drug therapy: Secondary | ICD-10-CM | POA: Diagnosis not present

## 2018-08-21 DIAGNOSIS — G4089 Other seizures: Secondary | ICD-10-CM

## 2018-08-21 LAB — CBC WITH DIFFERENTIAL/PLATELET
Abs Immature Granulocytes: 0.04 10*3/uL (ref 0.00–0.07)
BASOS PCT: 1 %
Basophils Absolute: 0.1 10*3/uL (ref 0.0–0.1)
EOS ABS: 0.4 10*3/uL (ref 0.0–0.5)
Eosinophils Relative: 5 %
HCT: 42.2 % (ref 36.0–46.0)
Hemoglobin: 13.9 g/dL (ref 12.0–15.0)
Immature Granulocytes: 1 %
Lymphocytes Relative: 24 %
Lymphs Abs: 1.9 10*3/uL (ref 0.7–4.0)
MCH: 29.6 pg (ref 26.0–34.0)
MCHC: 32.9 g/dL (ref 30.0–36.0)
MCV: 89.8 fL (ref 80.0–100.0)
MONO ABS: 0.5 10*3/uL (ref 0.1–1.0)
MONOS PCT: 7 %
NEUTROS PCT: 62 %
Neutro Abs: 4.9 10*3/uL (ref 1.7–7.7)
PLATELETS: 369 10*3/uL (ref 150–400)
RBC: 4.7 MIL/uL (ref 3.87–5.11)
RDW: 13.5 % (ref 11.5–15.5)
WBC: 7.7 10*3/uL (ref 4.0–10.5)
nRBC: 0 % (ref 0.0–0.2)

## 2018-08-21 LAB — COMPREHENSIVE METABOLIC PANEL
ALBUMIN: 3.6 g/dL (ref 3.5–5.0)
ALK PHOS: 75 U/L (ref 38–126)
ALT: 26 U/L (ref 0–44)
ANION GAP: 8 (ref 5–15)
AST: 24 U/L (ref 15–41)
BILIRUBIN TOTAL: 0.6 mg/dL (ref 0.3–1.2)
BUN: 16 mg/dL (ref 8–23)
CALCIUM: 9.6 mg/dL (ref 8.9–10.3)
CO2: 30 mmol/L (ref 22–32)
Chloride: 103 mmol/L (ref 98–111)
Creatinine, Ser: 1.26 mg/dL — ABNORMAL HIGH (ref 0.44–1.00)
GFR calc Af Amer: 51 mL/min — ABNORMAL LOW (ref >60)
GFR, EST NON AFRICAN AMERICAN: 44 mL/min — AB (ref >60)
GLUCOSE: 87 mg/dL (ref 70–99)
POTASSIUM: 3.8 mmol/L (ref 3.5–5.1)
Sodium: 141 mmol/L (ref 135–145)
TOTAL PROTEIN: 7 g/dL (ref 6.5–8.1)

## 2018-08-21 MED ORDER — ANAGRELIDE HCL 1 MG PO CAPS: 1.0000 mg | ORAL_CAPSULE | Freq: Every day | ORAL | 5 refills | Status: DC

## 2018-08-21 NOTE — Telephone Encounter (Signed)
Gave pt avs and calendar  °

## 2018-08-22 ENCOUNTER — Ambulatory Visit: Payer: BC Managed Care – PPO | Admitting: Internal Medicine

## 2018-08-22 ENCOUNTER — Other Ambulatory Visit: Payer: Self-pay | Admitting: Internal Medicine

## 2018-09-21 ENCOUNTER — Other Ambulatory Visit: Payer: Self-pay | Admitting: Internal Medicine

## 2018-09-25 ENCOUNTER — Other Ambulatory Visit: Payer: Self-pay | Admitting: Internal Medicine

## 2018-10-08 IMAGING — CR DG CHEST 2V
2 series · 2 of 2 positions shown · non-contrast
Comparison: 01/05/2014

CLINICAL DATA: Chest pain.

EXAM:
CHEST  2 VIEW

[w chest pa]
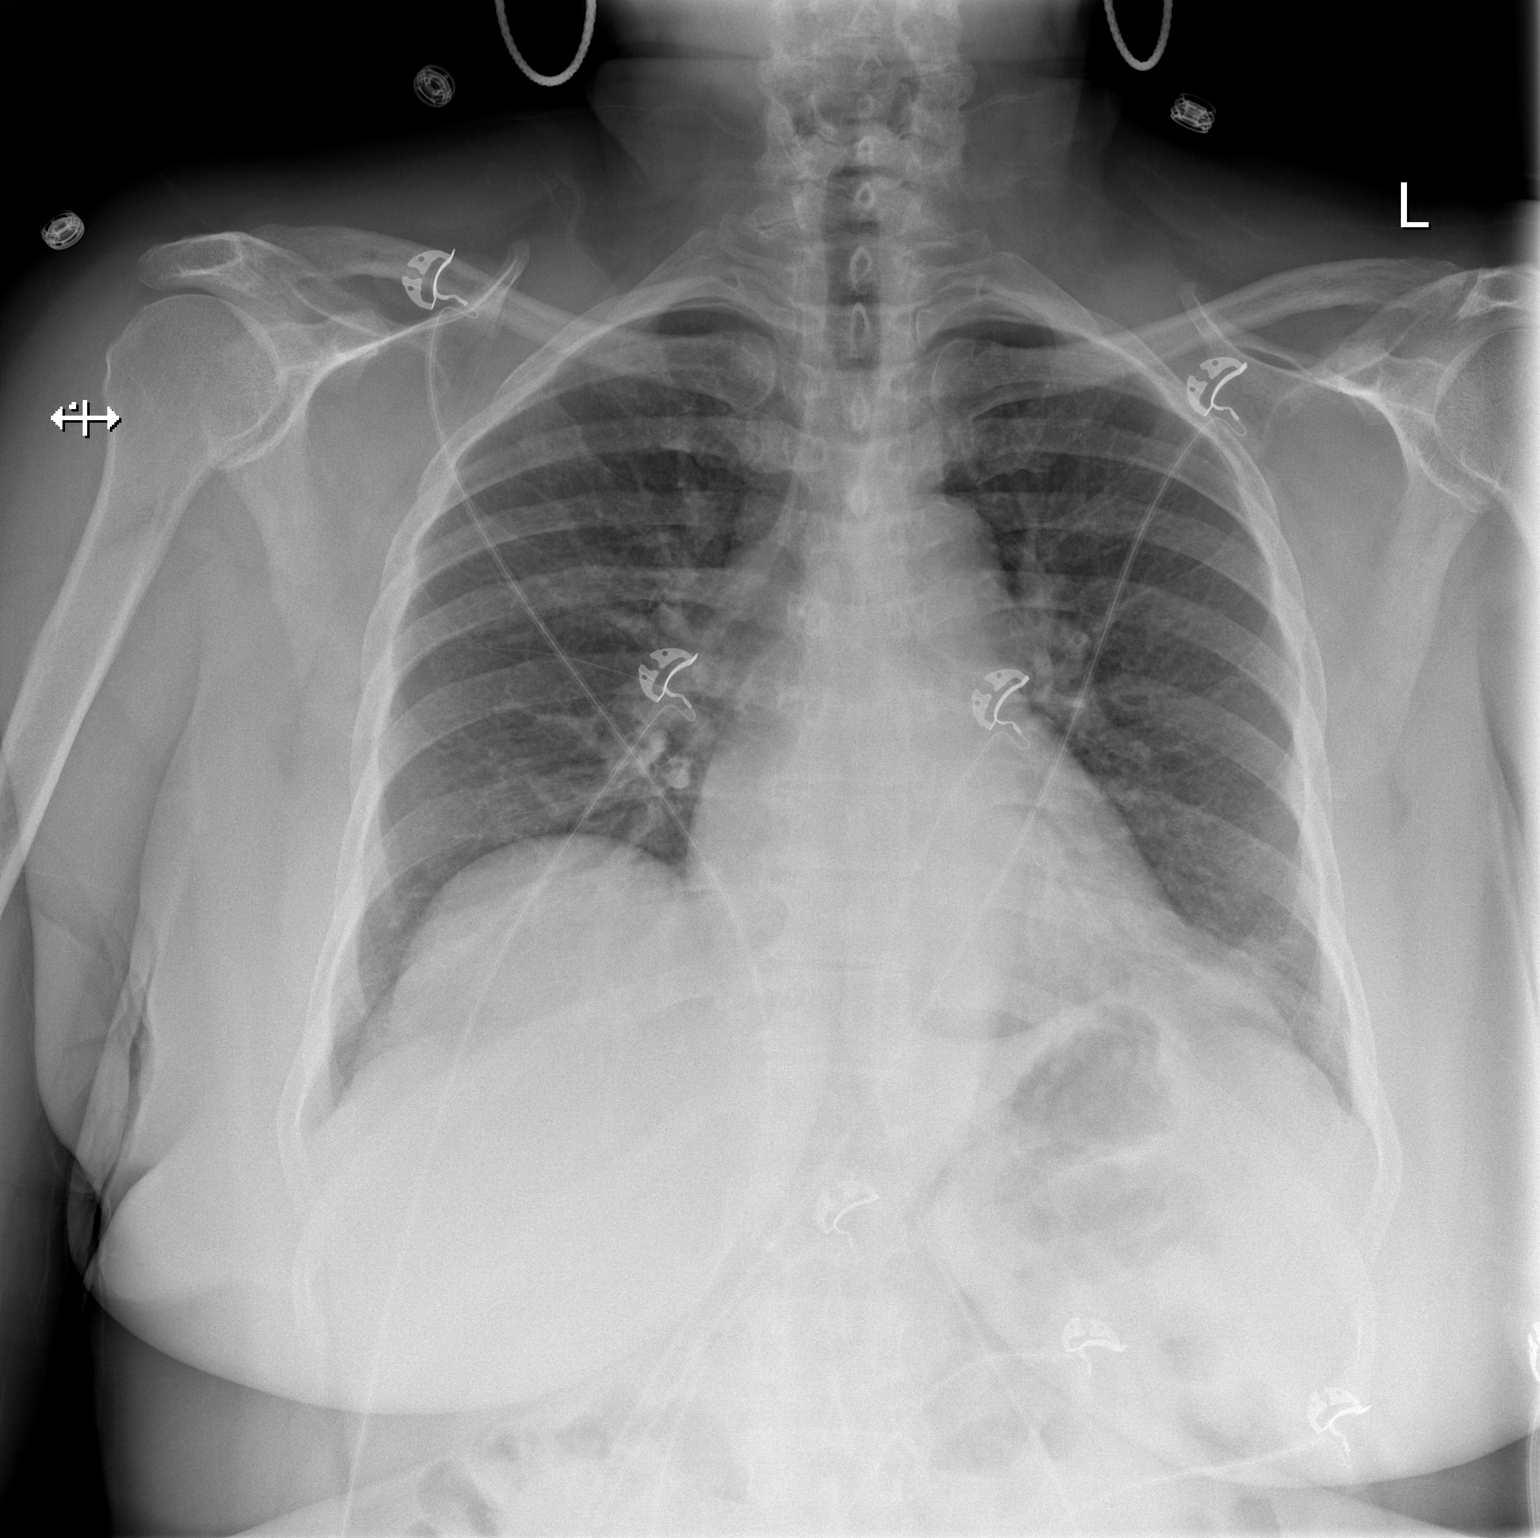

[w chest lat]
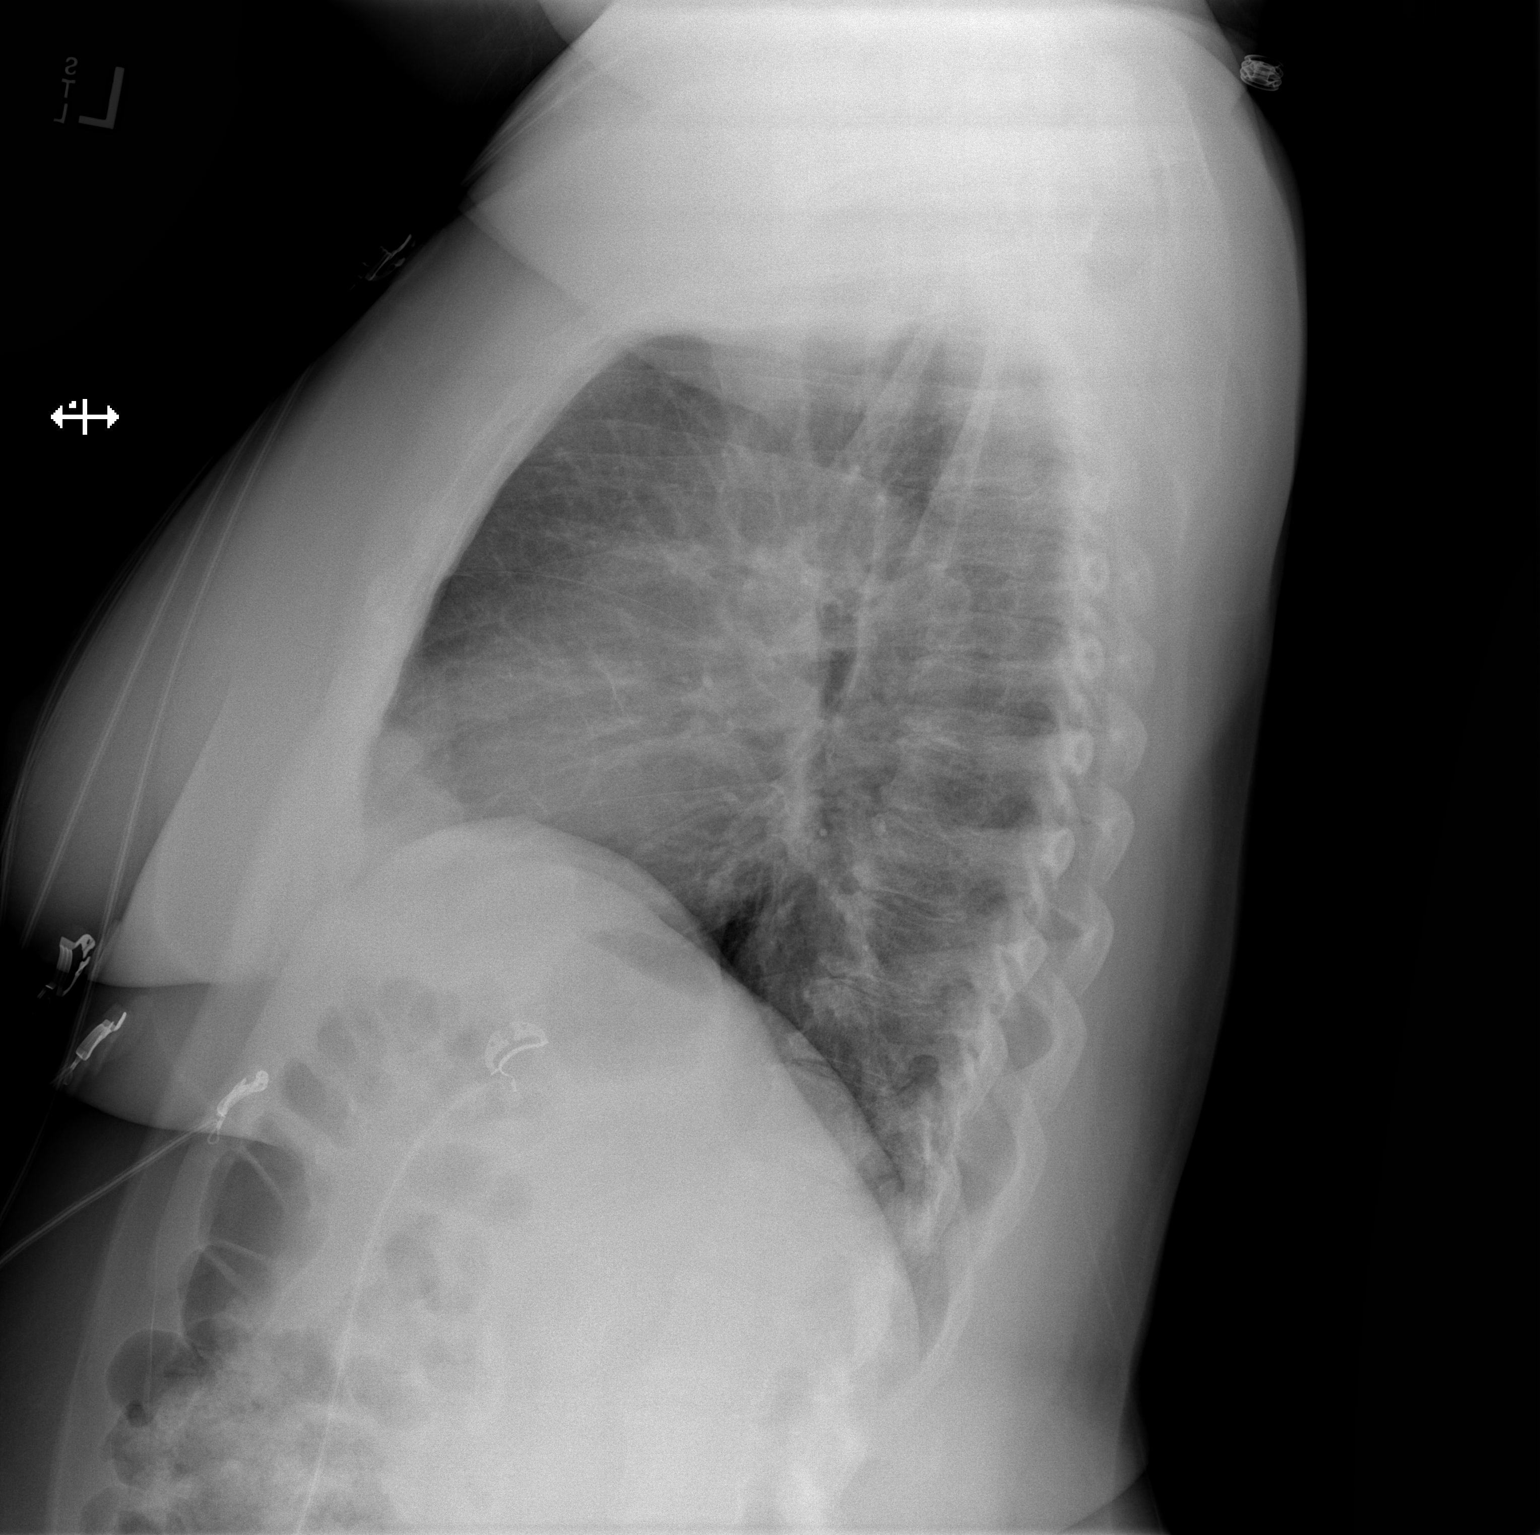

[2 of 2 positions shown; findings below may reference images not displayed]

FINDINGS: The heart size and pulmonary vascularity are normal and the lungs
are clear. Prominent left pericardial fat pad and/or scarring at the
left base, stable. No infiltrates or effusions. No acute bone
abnormality.
IMPRESSION: No active cardiopulmonary disease.

## 2018-10-14 ENCOUNTER — Other Ambulatory Visit: Payer: Self-pay | Admitting: Internal Medicine

## 2018-10-16 IMAGING — DX DG CHEST 1V PORT
1 series · 1 of 1 positions shown · non-contrast
Comparison: 04/22/2017

CLINICAL DATA: Postop from CABG.

EXAM:
PORTABLE CHEST 1 VIEW

[chest ap]
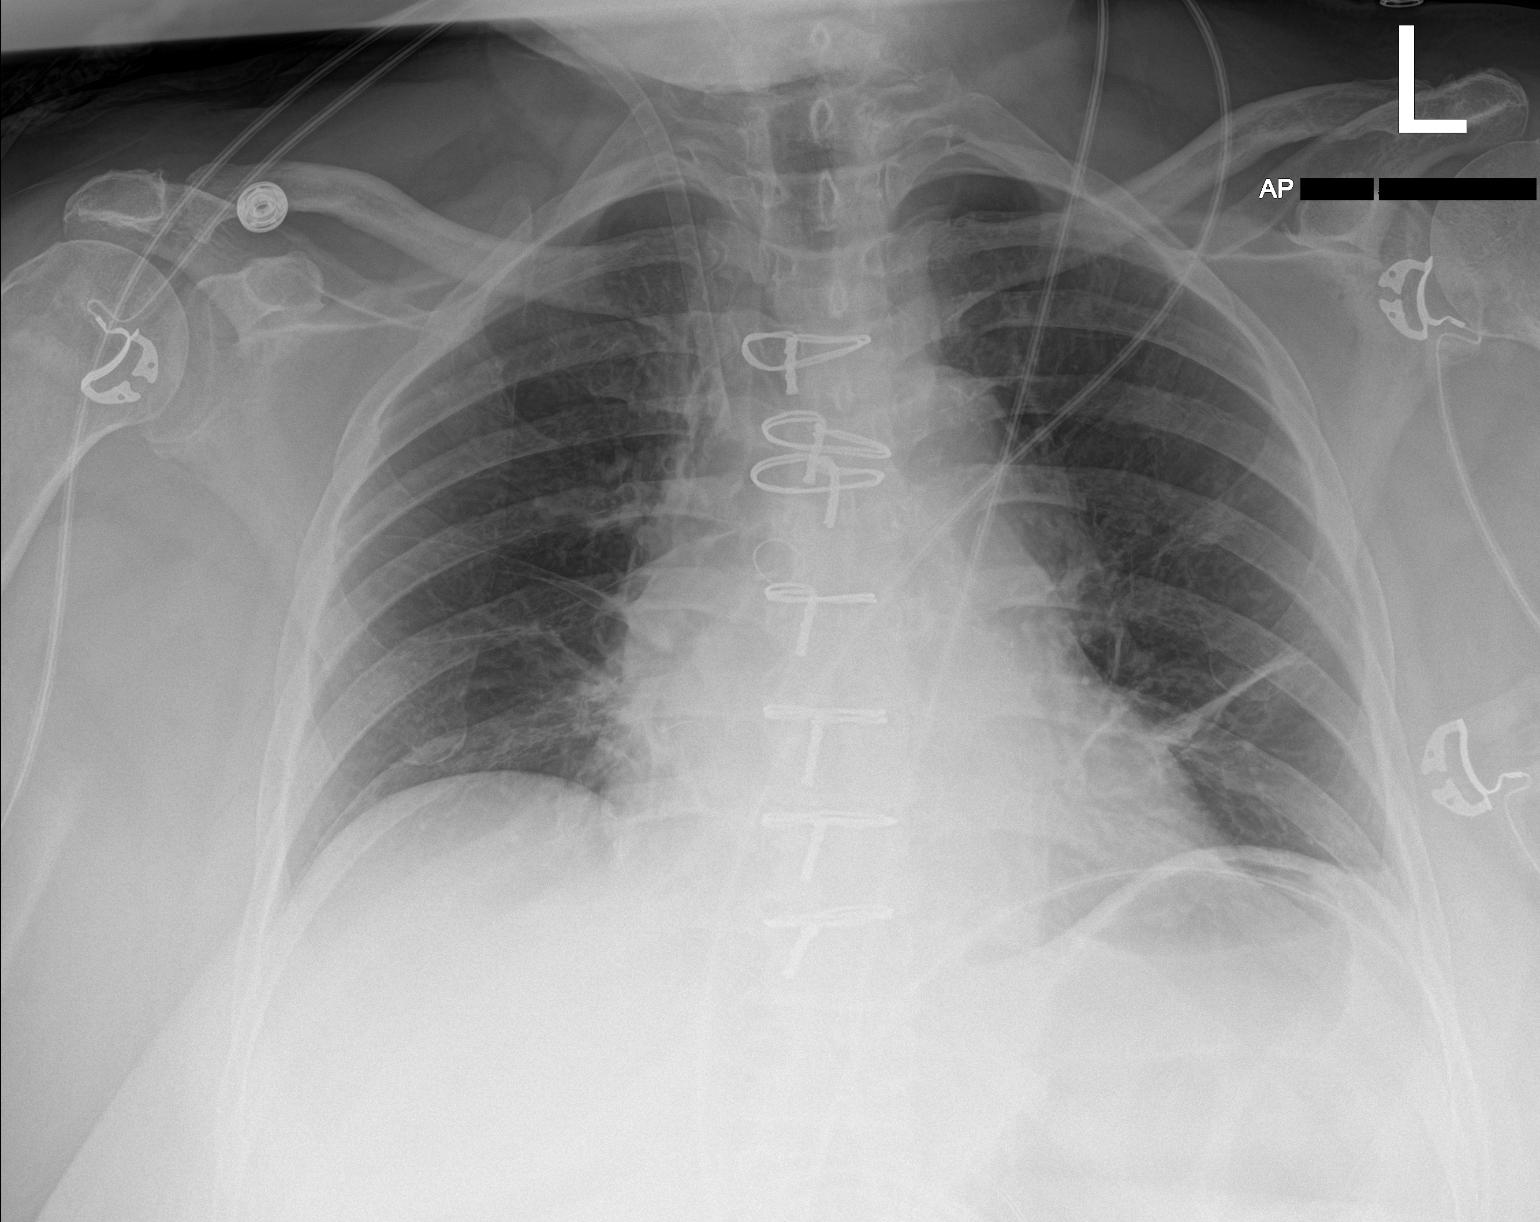

[1 of 1 positions shown; findings below may reference images not displayed]

FINDINGS: Left chest tube, mediastinal drain, and Swan-Ganz catheter have been
removed since previous study. Right jugular Cordis remains in place.

No evidence of pneumothorax. Mild subsegmental atelectasis again
noted in left lung base. No evidence of pulmonary edema or
consolidation. Prior CABG.
IMPRESSION: Mild left lower lung atelectasis. No evidence of pneumothorax or
other acute findings.

## 2018-11-13 ENCOUNTER — Other Ambulatory Visit: Payer: Self-pay | Admitting: Internal Medicine

## 2018-11-19 ENCOUNTER — Other Ambulatory Visit: Payer: Self-pay | Admitting: Internal Medicine

## 2018-11-20 ENCOUNTER — Inpatient Hospital Stay: Payer: BC Managed Care – PPO | Attending: Internal Medicine

## 2018-11-22 ENCOUNTER — Encounter: Payer: Self-pay | Admitting: Internal Medicine

## 2018-11-22 ENCOUNTER — Ambulatory Visit: Payer: Self-pay | Admitting: Internal Medicine

## 2018-11-22 ENCOUNTER — Other Ambulatory Visit: Payer: Self-pay

## 2018-11-22 ENCOUNTER — Ambulatory Visit: Payer: BC Managed Care – PPO | Admitting: Internal Medicine

## 2018-11-22 VITALS — BP 132/82 | HR 77 | Temp 98.4°F | Ht 61.2 in | Wt 213.6 lb

## 2018-11-22 DIAGNOSIS — E7800 Pure hypercholesterolemia, unspecified: Secondary | ICD-10-CM

## 2018-11-22 DIAGNOSIS — I10 Essential (primary) hypertension: Secondary | ICD-10-CM | POA: Diagnosis not present

## 2018-11-22 DIAGNOSIS — E1121 Type 2 diabetes mellitus with diabetic nephropathy: Secondary | ICD-10-CM

## 2018-11-22 DIAGNOSIS — K5903 Drug induced constipation: Secondary | ICD-10-CM | POA: Diagnosis not present

## 2018-11-22 MED ORDER — SEMAGLUTIDE(0.25 OR 0.5MG/DOS) 2 MG/1.5ML ~~LOC~~ SOPN: 0.5000 mg | PEN_INJECTOR | Freq: Every day | SUBCUTANEOUS | 0 refills | Status: DC

## 2018-11-22 MED ORDER — BLOOD PRESSURE CUFF MISC: 0 refills | Status: DC

## 2018-11-22 NOTE — Progress Notes (Addendum)
Subjective:     Patient ID: Sara [name] , female    DOB: [birth year] , 63 y.o.   MRN: [id number]   Chief Complaint  Patient presents with  . Hypertension    HPI Pt is here for FU HTN, her cardiologist added HCTZ 12.5 mg to her current medication.  She does not check her BP at home. She does not check her BP since she does not have a cuff.   Past Medical History:  Diagnosis Date  . Diabetes mellitus   . Gout   . Hyperlipemia   . Hypertension   . LVH (left ventricular hypertrophy)    12/21/09 Echo -mild asymmetric LVH,EF =>55%  . Morbid obesity ([place])   . OSA (obstructive sleep apnea)   . PAD (peripheral artery disease) ([place])   . Renal artery stenosis ([place]) 12/13/2010   PTCA and Stent - right  . Seizures ([place])   . Syncopal episodes   . Thrombocytosis (HCC)      Family History  Problem Relation Age of Onset  . Heart failure Mother   . Diabetes Father   . Diabetes Sister   . Hypertension Sister   . Hypertension Sister   . Hypertension Sister   . Breast cancer Maternal Aunt        2 maternal aunts had breast cancer.  . Breast cancer Maternal Grandmother      Current Outpatient Medications:  .  anagrelide ([name]) 1 MG capsule, Take 1 capsule (1 mg total) by mouth daily., Disp: 30 capsule, Rfl: 5 .  aspirin EC 325 MG EC tablet, Take 1 tablet (325 mg total) by mouth daily., Disp: 30 tablet, Rfl: 0 .  atorvastatin (LIPITOR) 40 MG tablet, TAKE 1 TABLET BY MOUTH DAILY AT 6 PM, Disp: 90 tablet, Rfl: 2 .  B Complex Vitamins (B COMPLEX PO), Take 1 tablet by mouth daily. , Disp: , Rfl:  .  Cholecalciferol (VITAMIN D3) 2000 UNITS capsule, Take 2,000 Units by mouth daily. , Disp: , Rfl:  .  hydrochlorothiazide (MICROZIDE) 12.5 MG capsule, TAKE 1 CAPSULE(12.5 MG) BY MOUTH DAILY, Disp: 30 capsule, Rfl: 8 .  metoprolol tartrate (LOPRESSOR) 50 MG tablet, TAKE 1 AND 1/2 TABLETS BY MOUTH TWICE A DAY, Disp: 90 tablet, Rfl: 5 .  OZEMPIC, 0.25 OR 0.5 MG/DOSE, 2 MG/1.5ML SOPN, INJECT  0.5MG  SUBCUTANEOUS ON THE SAME DAYS OF EACH WEEK IN THE ABDOMEN, THIGHS, OR UPPER ARM(ROTATING INJECTION SITES), Disp: 3 pen, Rfl: 1 .  telmisartan (MICARDIS) 80 MG tablet, TAKE 1 TABLET BY MOUTH EVERY DAY, Disp: 30 tablet, Rfl: 0 .  [name] 100 MG TABS, 1/2 tablet in the morning and 1.5 tablets in the evening, Disp: 180 tablet, Rfl: 3 .  acetaminophen (TYLENOL) 325 MG tablet, Take 2 tablets (650 mg total) by mouth every 6 (six) hours as needed for mild pain. (Patient taking differently: Take 325 mg by mouth at bedtime as needed for mild pain. ), Disp: , Rfl:    Allergies  Allergen Reactions  . Sulfa Antibiotics Itching     Review of Systems  Constitutional: Negative for appetite change, chills, diaphoresis, fatigue, fever and unexpected weight change.  HENT: Positive for tinnitus. Negative for congestion, ear discharge, ear pain, postnasal drip and rhinorrhea.        Ringing in ears off and on x 2 weeks and had some mild nose congestion around when this started.   Eyes: Negative for visual disturbance.  Respiratory: Negative for cough, chest tightness and shortness of breath.  Cardiovascular: Negative for chest pain, palpitations and leg swelling.  Gastrointestinal: Positive for constipation. Negative for abdominal pain, diarrhea, nausea and vomiting.       Occasional  Genitourinary: Negative for difficulty urinating and dysuria.  Skin: Negative for wound.  Neurological: Negative for numbness and headaches.  Hematological: Negative for adenopathy.     Today's Vitals   11/22/18 1618  BP: 132/82  Pulse: 77  Temp: 98.4 F (36.9 C)  TempSrc: Oral  SpO2: 97%  Weight: 213 lb 9.6 oz (96.9 kg)  Height: 5' 1.2" (1.554 m)   Body mass index is 40.1 kg/m.   Objective:  Physical Exam   Constitutional: She is oriented to person, place, and time. She appears well-developed and well-nourished. No distress.  HENT:  Head: Normocephalic and atraumatic.  Right Ear: External ear normal.   Left Ear: External ear normal.  Nose: Nose normal.  Eyes: Conjunctivae are normal. Right eye exhibits no discharge. Left eye exhibits no discharge. No scleral icterus.  Neck: Neck supple. No thyromegaly present.  No carotid bruits bilaterally  Cardiovascular: Normal rate and regular rhythm.  No murmur heard. Pulmonary/Chest: Effort normal and breath sounds normal. No respiratory distress.  Musculoskeletal: Normal range of motion. She exhibits no edema.  Lymphadenopathy:    She has no cervical adenopathy.  Neurological: She is alert and oriented to person, place, and time.  Skin: Skin is warm and dry. Capillary refill takes less than 2 seconds. No rash noted. She is not diaphoretic.  Psychiatric: She has a normal mood and affect. Her behavior is normal. Judgment and thought content normal.  Nursing note reviewed. REPEATED BP- 138/82  Assessment And Plan:    1. Essential hypertension- with improved control. She will do BP diaries and keep recordings til she comes back to see Dr Baird Cancer, but if higher than goal, will call us.  - CMP14 + Anion Gap - CBC no Diff FU in 3 months.  2. Controlled type 2 diabetes mellitus with diabetic nephropathy, without long-term current use of insulin (HCC) - Semaglutide,0.25 or 0.5MG /DOS, (OZEMPIC, 0.25 OR 0.5 MG/DOSE,) 2 MG/1.5ML SOPN; Inject 0.5 mg into the skin daily.  Dispense: 1 pen; Refill: 0 Will stay on current med.  3. Pure hypercholesterolemia- CHRONIC. May continue same med. - Lipid Profile - TSH - T4, Free - T3, free  4. Drug-induced constipation- from Ozempic. She was advised to try Colace on the days she gets constipated and continue pushing high fiber and hydrate.     Karen Huhta RODRIGUEZ-SOUTHWORTH, PA-C

## 2018-11-22 NOTE — Patient Instructions (Addendum)
INCREASE FIBER INTAKE AND WATER SPECIALLY ON THE DAYS YOU ARE CONSTIPATED, AND CAN HAVE AN EXTRA CUP OF GINGER TEA.   TRY COLACE 100 MG A DAY TO HELP HAVE SOFT STOOLS, THIS IS OVER THE COUNTER.   PLEASE DO BLOOD PRESSURE DIARIES AT LEAST TWICE A WEEK AND RECORD THEM TO BRING TO DR Baird Cancer. BUT IF THEY STAY ABOVE 130/80, CALL HER TO ASK IF SHE RECOMMENDS DOING ANYTHING ELSE SINCE YOUR CARDIOLOGIST SAID YOUR GOAL SHOULD BE 130/80 OR LESS.  AS YOU LOOSE WEIGHT, YOUR BLOOD PRESSURE SHOULD IMPROVE.

## 2018-11-22 NOTE — Assessment & Plan Note (Signed)
GOAL PER CARDIOLOGIST 130/80

## 2018-11-23 LAB — CMP14 + ANION GAP
ALBUMIN: 4.2 g/dL (ref 3.8–4.8)
ALK PHOS: 85 IU/L (ref 39–117)
ALT: 36 IU/L — ABNORMAL HIGH (ref 0–32)
AST: 24 IU/L (ref 0–40)
Albumin/Globulin Ratio: 1.6 (ref 1.2–2.2)
Anion Gap: 16 mmol/L (ref 10.0–18.0)
BUN / CREAT RATIO: 15 (ref 12–28)
BUN: 16 mg/dL (ref 8–27)
Bilirubin Total: 0.4 mg/dL (ref 0.0–1.2)
CALCIUM: 9.6 mg/dL (ref 8.7–10.3)
CO2: 26 mmol/L (ref 20–29)
CREATININE: 1.06 mg/dL — AB (ref 0.57–1.00)
Chloride: 101 mmol/L (ref 96–106)
GFR calc Af Amer: 64 mL/min/{1.73_m2} (ref >59)
GFR, EST NON AFRICAN AMERICAN: 56 mL/min/{1.73_m2} — AB (ref >59)
GLOBULIN, TOTAL: 2.6 g/dL (ref 1.5–4.5)
Glucose: 87 mg/dL (ref 65–99)
POTASSIUM: 4 mmol/L (ref 3.5–5.2)
SODIUM: 143 mmol/L (ref 134–144)
Total Protein: 6.8 g/dL (ref 6.0–8.5)

## 2018-11-23 LAB — CBC
HEMATOCRIT: 40.5 % (ref 34.0–46.6)
Hemoglobin: 13.8 g/dL (ref 11.1–15.9)
MCH: 29.9 pg (ref 26.6–33.0)
MCHC: 34.1 g/dL (ref 31.5–35.7)
MCV: 88 fL (ref 79–97)
Platelets: 409 10*3/uL (ref 150–450)
RBC: 4.61 x10E6/uL (ref 3.77–5.28)
RDW: 13.1 % (ref 11.7–15.4)
WBC: 8.3 10*3/uL (ref 3.4–10.8)

## 2018-11-23 LAB — LIPID PANEL
Chol/HDL Ratio: 2.3 ratio (ref 0.0–4.4)
Cholesterol, Total: 99 mg/dL — ABNORMAL LOW (ref 100–199)
HDL: 44 mg/dL (ref >39)
LDL Calculated: 47 mg/dL (ref 0–99)
Triglycerides: 42 mg/dL (ref 0–149)
VLDL Cholesterol Cal: 8 mg/dL (ref 5–40)

## 2018-11-23 LAB — T3, FREE: T3 FREE: 2.7 pg/mL (ref 2.0–4.4)

## 2018-11-23 LAB — TSH: TSH: 2.06 u[IU]/mL (ref 0.450–4.500)

## 2018-11-23 LAB — T4, FREE: Free T4: 1.24 ng/dL (ref 0.82–1.77)

## 2018-12-03 ENCOUNTER — Ambulatory Visit: Payer: BC Managed Care – PPO | Admitting: Internal Medicine

## 2018-12-04 ENCOUNTER — Ambulatory Visit: Payer: BC Managed Care – PPO | Admitting: Internal Medicine

## 2018-12-04 ENCOUNTER — Encounter: Payer: Self-pay | Admitting: Internal Medicine

## 2018-12-04 VITALS — BP 112/80 | HR 84 | Temp 98.2°F | Ht 61.4 in | Wt 215.6 lb

## 2018-12-04 DIAGNOSIS — J069 Acute upper respiratory infection, unspecified: Secondary | ICD-10-CM | POA: Diagnosis not present

## 2018-12-04 NOTE — Patient Instructions (Addendum)
Use Natie Pot saline and do them twice a day for 5-7 days.  Upper Respiratory Infection, Adult An upper respiratory infection (URI) affects the nose, throat, and upper air passages. URIs are caused by germs (viruses). The most common type of URI is often called "the common cold." Medicines cannot cure URIs, but you can do things at home to relieve your symptoms. URIs usually get better within 7-10 days. Follow these instructions at home: Activity  Rest as needed.  If you have a fever, stay home from work or school until your fever is gone, or until your doctor says you may return to work or school. ? You should stay home until you cannot spread the infection anymore (you are not contagious). ? Your doctor may have you wear a face mask so you have less risk of spreading the infection. Relieving symptoms  Gargle with a salt-water mixture 3-4 times a day or as needed. To make a salt-water mixture, completely dissolve -1 tsp of salt in 1 cup of warm water.  Use a cool-mist humidifier to add moisture to the air. This can help you breathe more easily. Eating and drinking   Drink enough fluid to keep your pee (urine) pale yellow.  Eat soups and other clear broths. General instructions   Take over-the-counter and prescription medicines only as told by your doctor. These include cold medicines, fever reducers, and cough suppressants.  Do not use any products that contain nicotine or tobacco. These include cigarettes and e-cigarettes. If you need help quitting, ask your doctor.  Avoid being where people are smoking (avoid secondhand smoke).  Make sure you get regular shots and get the flu shot every year.  Keep all follow-up visits as told by your doctor. This is important. How to avoid spreading infection to others   Wash your hands often with soap and water. If you do not have soap and water, use hand sanitizer.  Avoid touching your mouth, face, eyes, or nose.  Cough or sneeze into  a tissue or your sleeve or elbow. Do not cough or sneeze into your hand or into the air. Contact a doctor if:  You are getting worse, not better.  You have any of these: ? A fever. ? Chills. ? Brown or red mucus in your nose. ? Yellow or brown fluid (discharge)coming from your nose. ? Pain in your face, especially when you bend forward. ? Swollen neck glands. ? Pain with swallowing. ? White areas in the back of your throat. Get help right away if:  You have shortness of breath that gets worse.  You have very bad or constant: ? Headache. ? Ear pain. ? Pain in your forehead, behind your eyes, and over your cheekbones (sinus pain). ? Chest pain.  You have long-lasting (chronic) lung disease along with any of these: ? Wheezing. ? Long-lasting cough. ? Coughing up blood. ? A change in your usual mucus.  You have a stiff neck.  You have changes in your: ? Vision. ? Hearing. ? Thinking. ? Mood. Summary  An upper respiratory infection (URI) is caused by a germ called a virus. The most common type of URI is often called "the common cold."  URIs usually get better within 7-10 days.  Take over-the-counter and prescription medicines only as told by your doctor. This information is not intended to replace advice given to you by your health care provider. Make sure you discuss any questions you have with your health care provider. Document Released: 03/28/2008  Document Revised: 06/02/2017 Document Reviewed: 06/02/2017 Elsevier Interactive Patient Education  [employer].

## 2018-12-04 NOTE — Progress Notes (Signed)
Subjective:     Patient ID: Sara [name] , female    DOB: 1956/03/18 , 63 y.o.   MRN: [id number]   Chief Complaint  Patient presents with  . URI    Patient states she has a cough for the past week.     HPI  Onset of cough 1 week ago, and noes congestion, scratchy throat and rhinitis a few days later. She denies fever, chills, sweats, body aches or HA.  Has been using Zyrtec and noticed she does not have as much rhinitis today and her cough is not as bad.   Past Medical History:  Diagnosis Date  . Diabetes mellitus   . Gout   . Hyperlipemia   . Hypertension   . LVH (left ventricular hypertrophy)    12/21/09 Echo -mild asymmetric LVH,EF =>55%  . Morbid obesity ([place])   . OSA (obstructive sleep apnea)   . PAD (peripheral artery disease) ([place])   . Renal artery stenosis ([place]) 12/13/2010   PTCA and Stent - right  . Seizures ([place])   . Syncopal episodes   . Thrombocytosis (HCC)      Family History  Problem Relation Age of Onset  . Heart failure Mother   . Diabetes Father   . Diabetes Sister   . Hypertension Sister   . Hypertension Sister   . Hypertension Sister   . Breast cancer Maternal Aunt        2 maternal aunts had breast cancer.  . Breast cancer Maternal Grandmother      Current Outpatient Medications:  .  acetaminophen (TYLENOL) 325 MG tablet, Take 2 tablets (650 mg total) by mouth every 6 (six) hours as needed for mild pain. (Patient taking differently: Take 325 mg by mouth at bedtime as needed for mild pain. ), Disp: , Rfl:  .  anagrelide ([name]) 1 MG capsule, Take 1 capsule (1 mg total) by mouth daily., Disp: 30 capsule, Rfl: 5 .  aspirin EC 325 MG EC tablet, Take 1 tablet (325 mg total) by mouth daily., Disp: 30 tablet, Rfl: 0 .  atorvastatin (LIPITOR) 40 MG tablet, TAKE 1 TABLET BY MOUTH DAILY AT 6 PM, Disp: 90 tablet, Rfl: 2 .  B Complex Vitamins (B COMPLEX PO), Take 1 tablet by mouth daily. , Disp: , Rfl:  .  Blood Pressure Monitoring (BLOOD PRESSURE  CUFF) MISC, Check blood pressure 1-2 times a day 3 h after taking BP medication.  Dx- CAD        HTN., Disp: 1 each, Rfl: 0 .  Cholecalciferol (VITAMIN D3) 2000 UNITS capsule, Take 2,000 Units by mouth daily. , Disp: , Rfl:  .  hydrochlorothiazide (MICROZIDE) 12.5 MG capsule, TAKE 1 CAPSULE(12.5 MG) BY MOUTH DAILY, Disp: 30 capsule, Rfl: 8 .  metoprolol tartrate (LOPRESSOR) 50 MG tablet, TAKE 1 AND 1/2 TABLETS BY MOUTH TWICE A DAY, Disp: 90 tablet, Rfl: 5 .  Semaglutide,0.25 or 0.5MG /DOS, (OZEMPIC, 0.25 OR 0.5 MG/DOSE,) 2 MG/1.5ML SOPN, Inject 0.5 mg into the skin daily., Disp: 1 pen, Rfl: 0 .  telmisartan (MICARDIS) 80 MG tablet, TAKE 1 TABLET BY MOUTH EVERY DAY, Disp: 30 tablet, Rfl: 0 .  [name] 100 MG TABS, 1/2 tablet in the morning and 1.5 tablets in the evening, Disp: 180 tablet, Rfl: 3   Allergies  Allergen Reactions  . Sulfa Antibiotics Itching     Review of Systems  Constitutional: Negative for appetite change, chills, diaphoresis and fever.  HENT: Positive for congestion, postnasal drip, rhinorrhea and sore  throat. Negative for ear discharge, ear pain, sinus pressure, sinus pain, trouble swallowing and voice change.   Eyes: Negative for discharge.  Respiratory: Positive for cough. Negative for shortness of breath and wheezing.   Cardiovascular: Negative for chest pain.  Gastrointestinal: Negative for nausea and vomiting.       Was vomiting when she coughed hard initially.   Musculoskeletal: Negative for myalgias.  Skin: Negative for rash.  Allergic/Immunologic: Positive for environmental allergies.  Neurological: Negative for dizziness and headaches.  Hematological: Negative for adenopathy.     Today's Vitals   12/04/18 1611  BP: 112/80  Pulse: 84  Temp: 98.2 F (36.8 C)  TempSrc: Oral  SpO2: 95%  Weight: 215 lb 9.6 oz (97.8 kg)  Height: 5' 1.4" (1.56 m)  PainSc: 0-No pain   Body mass index is 40.21 kg/m.   Objective:  Physical Exam Constitutional:      General:  She is not in acute distress.    Appearance: Normal appearance. She is not ill-appearing, toxic-appearing or diaphoretic.  HENT:     Head: Normocephalic.     Right Ear: Tympanic membrane, ear canal and external ear normal.     Left Ear: Tympanic membrane, ear canal and external ear normal.     Nose: Rhinorrhea present. No congestion.     Mouth/Throat:     Mouth: Mucous membranes are moist.     Pharynx: Oropharynx is clear. No oropharyngeal exudate or posterior oropharyngeal erythema.  Eyes:     General: No scleral icterus.       Right eye: No discharge.        Left eye: No discharge.     Conjunctiva/sclera: Conjunctivae normal.     Pupils: Pupils are equal, round, and reactive to light.  Neck:     Musculoskeletal: Neck supple. No neck rigidity or muscular tenderness.  Cardiovascular:     Rate and Rhythm: Normal rate and regular rhythm.     Heart sounds: No murmur.  Pulmonary:     Effort: Pulmonary effort is normal.     Breath sounds: Normal breath sounds. No wheezing, rhonchi or rales.  Musculoskeletal: Normal range of motion.  Lymphadenopathy:     Cervical: No cervical adenopathy.  Skin:    General: Skin is warm and dry.     Coloration: Skin is not jaundiced.     Findings: No rash.  Neurological:     Mental Status: She is alert and oriented to person, place, and time.  Psychiatric:        Mood and Affect: Mood normal.        Behavior: Behavior normal.        Thought Content: Thought content normal.        Judgment: Judgment normal.     Assessment And Plan:   1. Upper respiratory infection, viral- I reassured her she does not have a bacterial infection. I educated her how to do saline nose rinses and showed her a video demonstrating this. Advised to do this bid x 5-7 days. FU if she gets worse.      Sara Dlugosz RODRIGUEZ-SOUTHWORTH, PA-C

## 2018-12-13 ENCOUNTER — Other Ambulatory Visit: Payer: Self-pay

## 2018-12-13 ENCOUNTER — Telehealth: Payer: Self-pay

## 2018-12-13 DIAGNOSIS — D473 Essential (hemorrhagic) thrombocythemia: Secondary | ICD-10-CM

## 2018-12-13 MED ORDER — ANAGRELIDE HCL 0.5 MG PO CAPS: 0.5000 mg | ORAL_CAPSULE | Freq: Two times a day (BID) | ORAL | 1 refills | Status: DC

## 2018-12-13 MED FILL — ANAGRELIDE HCL 0.5 MG CAP: 0.5 | 30 days supply | Qty: 60 | Fill #0

## 2018-12-13 NOTE — Telephone Encounter (Signed)
Patient calls stating her pharmacy does not have Anagrelide 1 mg and don't know when they will.  Sent in script for 0.5 mg Anagrelide to [facility] pharmacy as they have them in stock.  Patient will take 0.5 mg 2 a day.  Called patient, she verbalized an understanding.

## 2018-12-13 NOTE — Progress Notes (Signed)
ana

## 2018-12-17 ENCOUNTER — Other Ambulatory Visit: Payer: Self-pay | Admitting: Internal Medicine

## 2018-12-24 ENCOUNTER — Encounter: Payer: Self-pay | Admitting: Internal Medicine

## 2018-12-24 ENCOUNTER — Ambulatory Visit: Payer: BC Managed Care – PPO | Admitting: Internal Medicine

## 2018-12-24 VITALS — BP 132/96 | HR 78 | Temp 98.0°F | Ht 60.6 in | Wt 213.0 lb

## 2018-12-24 DIAGNOSIS — E1122 Type 2 diabetes mellitus with diabetic chronic kidney disease: Secondary | ICD-10-CM

## 2018-12-24 DIAGNOSIS — I129 Hypertensive chronic kidney disease with stage 1 through stage 4 chronic kidney disease, or unspecified chronic kidney disease: Secondary | ICD-10-CM

## 2018-12-24 DIAGNOSIS — F4322 Adjustment disorder with anxiety: Secondary | ICD-10-CM

## 2018-12-24 DIAGNOSIS — G4089 Other seizures: Secondary | ICD-10-CM

## 2018-12-24 DIAGNOSIS — F5102 Adjustment insomnia: Secondary | ICD-10-CM

## 2018-12-24 DIAGNOSIS — D473 Essential (hemorrhagic) thrombocythemia: Secondary | ICD-10-CM

## 2018-12-24 DIAGNOSIS — N182 Chronic kidney disease, stage 2 (mild): Secondary | ICD-10-CM

## 2018-12-24 NOTE — Progress Notes (Signed)
Subjective:     Patient ID: Sara [name] , female    DOB: May 05, 1956 , 63 y.o.   MRN: [id number]   Chief Complaint  Patient presents with  . stress    HPI  Anxiety  Presents for initial visit. The problem has been gradually worsening. Symptoms include insomnia and nervous/anxious behavior. The symptoms are aggravated by work stress.       Past Medical History:  Diagnosis Date  . Diabetes mellitus   . Gout   . Hyperlipemia   . Hypertension   . LVH (left ventricular hypertrophy)    12/21/09 Echo -mild asymmetric LVH,EF =>55%  . Morbid obesity ([place])   . OSA (obstructive sleep apnea)   . PAD (peripheral artery disease) ([place])   . Renal artery stenosis ([place]) 12/13/2010   PTCA and Stent - right  . Seizures ([place])   . Syncopal episodes   . Thrombocytosis (HCC)      Family History  Problem Relation Age of Onset  . Heart failure Mother   . Diabetes Father   . Diabetes Sister   . Hypertension Sister   . Hypertension Sister   . Hypertension Sister   . Breast cancer Maternal Aunt        2 maternal aunts had breast cancer.  . Breast cancer Maternal Grandmother      Current Outpatient Medications:  .  acetaminophen (TYLENOL) 325 MG tablet, Take 2 tablets (650 mg total) by mouth every 6 (six) hours as needed for mild pain. (Patient taking differently: Take 325 mg by mouth at bedtime as needed for mild pain. ), Disp: , Rfl:  .  anagrelide ([name]) 0.5 MG capsule, Take 1 capsule (0.5 mg total) by mouth 2 (two) times daily., Disp: 60 capsule, Rfl: 1 .  aspirin EC 325 MG EC tablet, Take 1 tablet (325 mg total) by mouth daily., Disp: 30 tablet, Rfl: 0 .  atorvastatin (LIPITOR) 40 MG tablet, TAKE 1 TABLET BY MOUTH DAILY AT 6 PM, Disp: 90 tablet, Rfl: 2 .  B Complex Vitamins (B COMPLEX PO), Take 1 tablet by mouth daily. , Disp: , Rfl:  .  Blood Pressure Monitoring (BLOOD PRESSURE CUFF) MISC, Check blood pressure 1-2 times a day 3 h after taking BP medication.  Dx- CAD        HTN.,  Disp: 1 each, Rfl: 0 .  Cholecalciferol (VITAMIN D3) 2000 UNITS capsule, Take 2,000 Units by mouth daily. , Disp: , Rfl:  .  hydrochlorothiazide (MICROZIDE) 12.5 MG capsule, TAKE 1 CAPSULE(12.5 MG) BY MOUTH DAILY, Disp: 30 capsule, Rfl: 8 .  metoprolol tartrate (LOPRESSOR) 50 MG tablet, TAKE 1 AND 1/2 TABLETS BY MOUTH TWICE A DAY, Disp: 90 tablet, Rfl: 5 .  Semaglutide,0.25 or 0.5MG /DOS, (OZEMPIC, 0.25 OR 0.5 MG/DOSE,) 2 MG/1.5ML SOPN, Inject 0.5 mg into the skin daily., Disp: 1 pen, Rfl: 0 .  telmisartan (MICARDIS) 80 MG tablet, TAKE 1 TABLET BY MOUTH EVERY DAY, Disp: 30 tablet, Rfl: 0 .  [name] 100 MG TABS, 1/2 tablet in the morning and 1.5 tablets in the evening, Disp: 180 tablet, Rfl: 3   Allergies  Allergen Reactions  . Sulfa Antibiotics Itching     Review of Systems  Constitutional: Negative.   Respiratory: Negative.   Cardiovascular: Negative.   Gastrointestinal: Negative.   Neurological: Negative.   Psychiatric/Behavioral: Positive for sleep disturbance. The patient is nervous/anxious and has insomnia.      Today's Vitals   12/24/18 1526  BP: (!) 132/96  Pulse:  78  Temp: 98 F (36.7 C)  TempSrc: Oral  Weight: 213 lb (96.6 kg)  Height: 5' 0.6" (1.539 m)   Body mass index is 40.78 kg/m.   Objective:  Physical Exam Vitals signs and nursing note reviewed.  Constitutional:      Appearance: Normal appearance.  HENT:     Head: Normocephalic and atraumatic.  Cardiovascular:     Rate and Rhythm: Normal rate and regular rhythm.     Heart sounds: Normal heart sounds.  Pulmonary:     Effort: Pulmonary effort is normal.     Breath sounds: Normal breath sounds.  Skin:    General: Skin is warm.  Neurological:     General: No focal deficit present.     Mental Status: She is alert.  Psychiatric:        Mood and Affect: Mood normal.        Behavior: Behavior normal.         Assessment And Plan:     1. Adjustment disorder with anxious mood  She agrees to go to  therapy. I will take her out of work for four weeks, through 3/30. I anticipate that she will be able to return to work on 3/31.   2. Adjustment insomnia  She does not want to start rx meds to help her sleep. She is advised to consider use of magnesium nightly. If this is not effective, I will consider adding melatonin nightly.   3. Parenchymal renal hypertension, stage 1 through stage 4 or unspecified chronic kidney disease  Fair control. She will continue with current meds for now.  She is aware optimal bp is less than 130/70.  She is encouraged to incorporate more exercise into her daily routine.   4. Type 2 diabetes mellitus with stage 2 chronic kidney disease, without long-term current use of insulin ([place])  I did review her most recent labwork. Her last a1c was six months ago. Importance of dietary, medication and exercise compliance was discussed with the patient. She is also encouraged to keep regular office visits.    - Hemoglobin A1c  5. Other seizures ([name])  Chronic. As per neurology.   6. Essential thrombocythemia ([place])  Chronic. She is also followed by Hematology.   [name], MD

## 2018-12-24 NOTE — Patient Instructions (Signed)
Adjustment Disorder, Adult Adjustment disorder is a group of symptoms that can develop after a stressful life event, such as the loss of a job or serious physical illness. The symptoms can affect how you feel, think, and act. They may interfere with your relationships. Adjustment disorder increases your risk of suicide and substance abuse. If this disorder is not managed early, it can develop into a more serious condition, such as major depressive disorder or post-traumatic stress disorder. What are the causes? This condition happens when you have trouble recovering from or coping with a stressful life event. What increases the risk? You are more likely to develop this condition if:  You have had depression or anxiety.  You are being treated for a long-term (chronic) illness.  You are being treated for an illness that cannot be cured (terminal illness).  You have a family history of mental illness. What are the signs or symptoms? Symptoms of this condition include:  Extreme trouble doing daily tasks, such as going to work.  Sadness, depression, or crying spells.  Worrying a lot.  Loss of enjoyment.  Change in appetite or weight.  Feelings of loss or hopelessness.  Thoughts of suicide.  Anxiety, worry, or nervousness.  Trouble sleeping.  Avoiding family and friends.  Fighting or vandalism.  Complaining of feeling sick without being ill.  Feeling dazed or disconnected.  Nightmares.  Trouble sleeping.  Irritability.  Reckless driving.  Poor work performance.  Ignoring bills. Symptoms of this condition start within three months of the stressful event. They do not last more than six months, unless the stressful circumstances last longer. Normal grieving after the death of a loved one is not a symptom of this condition. How is this diagnosed? To diagnose this condition, your health care provider will ask about what has happened in your life and how it has affected  you. He or she may also ask about your medical history and your use of medicines, alcohol, and other substances. Your health care provider may do a physical exam and order lab tests or other studies. You may be referred to a mental health specialist. How is this treated? Treatment options for this condition include:  Counseling or talk therapy. Talk therapy is usually provided by mental health specialists.  Medicines. Certain medicines may help with depression, anxiety, and sleep.  Support groups. These offer emotional support, advice, and guidance. They are made up of people who have had similar experiences.  Observation and time. This is sometimes called "watchful waiting." In this treatment, health care providers monitor your health and behavior without other treatment. Adjustment disorder sometimes gets better on its own with time. Follow these instructions at home:  Take over-the-counter and prescription medicines only as told by your health care provider.  Keep all follow-up visits as told by your health care provider. This is important. Contact a health care provider if:  Your symptoms do not improve in six months.  Your symptoms get worse. Get help right away if:  You have serious thoughts about hurting yourself or someone else. If you ever feel like you may hurt yourself or others, or have thoughts about taking your own life, get help right away. You can go to your nearest emergency department or call:  Your local emergency services (911 in the [place]  A suicide crisis helpline, such as the National Suicide Prevention Lifeline at 1-800-273-8255. This is open 24 hours a day. Summary  Adjustment disorder is a group of symptoms that can develop   to your nearest emergency department or call:  · Your local emergency services (911 in the [place]  · A suicide crisis helpline, such as the National Suicide Prevention Lifeline at 1-800-273-8255. This is open 24 hours a day.  Summary  · Adjustment disorder is a group of symptoms that can develop after a stressful life event, such as the loss of a job or serious physical illness. The symptoms can affect how you feel, think, and act. They may interfere with your relationships.  · Symptoms of this condition start within three months  of the stressful event. They do not last more than six months, unless the stressful circumstances last longer.  · Treatment may include talk therapy, medicines, participation in a support group, or observation to see if symptoms improve.  · Contact your health care provider if your symptoms get worse or do not improve in six months.  · If you ever feel like you may hurt yourself or others, or have thoughts about taking your own life, get help right away.  This information is not intended to replace advice given to you by your health care provider. Make sure you discuss any questions you have with your health care provider.  Document Released: 06/14/2006 Document Revised: 12/09/2016 Document Reviewed: 12/09/2016  Elsevier Interactive Patient Education © 2019 [employer]

## 2018-12-25 LAB — HEMOGLOBIN A1C
Est. average glucose Bld gHb Est-mCnc: 123 mg/dL
Hgb A1c MFr Bld: 5.9 % — ABNORMAL HIGH (ref 4.8–5.6)

## 2019-01-07 ENCOUNTER — Other Ambulatory Visit: Payer: Self-pay | Admitting: Cardiovascular Disease

## 2019-01-11 ENCOUNTER — Telehealth: Payer: Self-pay

## 2019-01-11 ENCOUNTER — Telehealth: Payer: Self-pay | Admitting: Hematology

## 2019-01-11 ENCOUNTER — Other Ambulatory Visit: Payer: Self-pay | Admitting: Hematology

## 2019-01-11 MED ORDER — HYDROXYUREA 500 MG PO CAPS: ORAL_CAPSULE | ORAL | 5 refills | Status: DC

## 2019-01-11 NOTE — Telephone Encounter (Signed)
Patients calls stating Anagrelide on back order.  She wants to know what she can take in the interim?  [phone]

## 2019-01-11 NOTE — Telephone Encounter (Signed)
I called patient back.  Due to the lack of supply of anagrelide, I will switch her anagrelide to [facility], which she was on previously and tolerating well.  Patient agrees.  I called in Hydrea 500 mg daily, except 1000 mg daily on Mondays and Thursdays.  Truitt Merle  01/11/2019

## 2019-01-14 ENCOUNTER — Other Ambulatory Visit: Payer: Self-pay | Admitting: Internal Medicine

## 2019-01-22 ENCOUNTER — Encounter: Payer: Self-pay | Admitting: Internal Medicine

## 2019-01-22 ENCOUNTER — Ambulatory Visit: Payer: BC Managed Care – PPO | Admitting: Internal Medicine

## 2019-01-22 ENCOUNTER — Other Ambulatory Visit: Payer: Self-pay

## 2019-01-22 VITALS — BP 120/94 | HR 71 | Temp 98.2°F | Ht 60.4 in | Wt 208.6 lb

## 2019-01-22 DIAGNOSIS — I129 Hypertensive chronic kidney disease with stage 1 through stage 4 chronic kidney disease, or unspecified chronic kidney disease: Secondary | ICD-10-CM

## 2019-01-22 DIAGNOSIS — F4322 Adjustment disorder with anxiety: Secondary | ICD-10-CM | POA: Diagnosis not present

## 2019-01-22 DIAGNOSIS — N182 Chronic kidney disease, stage 2 (mild): Secondary | ICD-10-CM

## 2019-01-22 DIAGNOSIS — F419 Anxiety disorder, unspecified: Secondary | ICD-10-CM | POA: Diagnosis not present

## 2019-01-22 DIAGNOSIS — Z6841 Body Mass Index (BMI) 40.0 and over, adult: Secondary | ICD-10-CM

## 2019-01-22 NOTE — Progress Notes (Signed)
Subjective:     Patient ID: Sara [name] , female    DOB: 06-Sep-1956 , 63 y.o.   MRN: [id number]   Chief Complaint  Patient presents with  . paperwork    HPI  She is here today to discuss paperwork that needs to be completed for her job.  At her last visit, she reported developing anxiety/stress due to new job description. She already works in a position where she does not have assistance. She was then given new responsibilities with a recent management re-organization. She has angst regarding returning to work later this week.     Past Medical History:  Diagnosis Date  . Diabetes mellitus   . Gout   . Hyperlipemia   . Hypertension   . LVH (left ventricular hypertrophy)    12/21/09 Echo -mild asymmetric LVH,EF =>55%  . Morbid obesity ([place])   . OSA (obstructive sleep apnea)   . PAD (peripheral artery disease) ([place])   . Renal artery stenosis ([place]) 12/13/2010   PTCA and Stent - right  . Seizures ([place])   . Syncopal episodes   . Thrombocytosis ([facility])      Family History  Problem Relation Age of Onset  . Heart failure Mother   . Diabetes Father   . Diabetes Sister   . Hypertension Sister   . Hypertension Sister   . Hypertension Sister   . Breast cancer Maternal Aunt        2 maternal aunts had breast cancer.  . Breast cancer Maternal Grandmother      Current Outpatient Medications:  .  acetaminophen (TYLENOL) 325 MG tablet, Take 2 tablets (650 mg total) by mouth every 6 (six) hours as needed for mild pain. (Patient taking differently: Take 325 mg by mouth at bedtime as needed for mild pain. ), Disp: , Rfl:  .  aspirin EC 325 MG EC tablet, Take 1 tablet (325 mg total) by mouth daily., Disp: 30 tablet, Rfl: 0 .  atorvastatin (LIPITOR) 40 MG tablet, TAKE 1 TABLET BY MOUTH DAILY AT 6 PM, Disp: 90 tablet, Rfl: 2 .  B Complex Vitamins (B COMPLEX PO), Take 1 tablet by mouth daily. , Disp: , Rfl:  .  Blood Pressure Monitoring (BLOOD PRESSURE CUFF) MISC, Check blood pressure  1-2 times a day 3 h after taking BP medication.  Dx- CAD        HTN., Disp: 1 each, Rfl: 0 .  Cholecalciferol (VITAMIN D3) 2000 UNITS capsule, Take 2,000 Units by mouth daily. , Disp: , Rfl:  .  hydrochlorothiazide (MICROZIDE) 12.5 MG capsule, TAKE 1 CAPSULE(12.5 MG) BY MOUTH DAILY, Disp: 30 capsule, Rfl: 8 .  hydroxyurea (HYDREA) 500 MG capsule, Take 1 capsule daily except 2 capsules daily on Mondays and Thursdays. May take with food to minimize GI side effects., Disp: 40 capsule, Rfl: 5 .  metoprolol tartrate (LOPRESSOR) 50 MG tablet, TAKE 1 AND 1/2 TABLETS BY MOUTH TWICE A DAY, Disp: 90 tablet, Rfl: 5 .  Semaglutide,0.25 or 0.5MG /DOS, (OZEMPIC, 0.25 OR 0.5 MG/DOSE,) 2 MG/1.5ML SOPN, Inject 0.5 mg into the skin daily., Disp: 1 pen, Rfl: 0 .  telmisartan (MICARDIS) 80 MG tablet, TAKE 1 TABLET BY MOUTH EVERY DAY, Disp: 90 tablet, Rfl: 1 .  [name] 100 MG TABS, 1/2 tablet in the morning and 1.5 tablets in the evening, Disp: 180 tablet, Rfl: 3   Allergies  Allergen Reactions  . Sulfa Antibiotics Itching     Review of Systems  Constitutional: Negative.   Respiratory:  Negative.   Cardiovascular: Negative.   Gastrointestinal: Negative.   Neurological: Negative.   Psychiatric/Behavioral: Positive for sleep disturbance. The patient is nervous/anxious.    she is fidgety on exam  Today's Vitals   01/22/19 1508  BP: (!) 120/94  Pulse: 71  Temp: 98.2 F (36.8 C)  TempSrc: Oral  Weight: 208 lb 9.6 oz (94.6 kg)  Height: 5' 0.4" (1.534 m)   Body mass index is 40.2 kg/m.   Objective:  Physical Exam Vitals signs and nursing note reviewed.  Constitutional:      Appearance: Normal appearance.  HENT:     Head: Normocephalic and atraumatic.  Cardiovascular:     Rate and Rhythm: Normal rate and regular rhythm.     Heart sounds: Normal heart sounds.  Pulmonary:     Effort: Pulmonary effort is normal.     Breath sounds: Normal breath sounds.  Skin:    General: Skin is warm.  Neurological:      General: No focal deficit present.     Mental Status: She is alert.  Psychiatric:        Mood and Affect: Mood normal.        Behavior: Behavior normal.         Assessment And Plan:     1. Anxiety  Persistent. She did complete Ham-D Anxiety Scale. Her score was /14. She does agree to referral for psychiatric eval/therapy.  She will send me FMLA paperwork to complete. She will consider magnesium supplementation.   2. Adjustment disorder with anxious mood  She is having difficulty adjusting to new work load. 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   3. Parenchymal renal hypertension, stage 1 through stage 4 or unspecified chronic kidney disease  Fair control. She will continue with current meds. Importance of regular exercise was discussed with the patient.   4. Chronic renal disease, stage II  Chronic. I will check a GFR. Cr at her next visit.   5. Class 3 severe obesity due to excess calories with serious comorbidity and body mass index (BMI) of 40.0 to 44.9 in adult [facility])  Importance of achieving optimal weight to decrease risk of cardiovascular disease and cancers was discussed with the patient in full detail. She is encouraged to start slowly - start with 10 minutes twice daily at least three to four days per week and to gradually build to 30 minutes five days weekly. She was given tips to incorporate more activity into her daily routine - take stairs when possible, park farther away from her job, grocery stores, etc.     SHE IS ENCOURAGED TO PRACTICE SOCIAL DISTANCING.    [name], MD

## 2019-01-22 NOTE — Patient Instructions (Signed)

## 2019-01-24 ENCOUNTER — Ambulatory Visit: Payer: BC Managed Care – PPO | Admitting: Internal Medicine

## 2019-02-06 ENCOUNTER — Telehealth: Payer: Self-pay

## 2019-02-06 NOTE — Telephone Encounter (Signed)
Patient calls concerned maybe having side effects from Hydrea.  She does state that she is taking a couple of medications (Ozempic and HCTZ) now that she was not taking when she took Hydrea before.  She complains of intermittent stiffness in her neck and some ear pain, also intermittent what she describes is deep "bone" pain in her legs.  The pain in both areas comes and goes.  Advised her I would discuss with Dr. Burr Medico and call her back.   Note:  Message placed on Dr. Ernestina Penna desk for review.

## 2019-02-11 ENCOUNTER — Telehealth: Payer: Self-pay

## 2019-02-11 NOTE — Telephone Encounter (Signed)
Spoke with patient concerning the side effects she is experiencing from hydrea.  Per Dr. Burr Medico she consulted with our pharmacy and there are no drug interactions with Ozempic and HCTZ.  Dr. Burr Medico is suggesting she stop taking it for 3 to 5 days then restart it.  Patient verbalized an understanding.

## 2019-02-15 ENCOUNTER — Other Ambulatory Visit: Payer: Self-pay | Admitting: Hematology

## 2019-02-17 ENCOUNTER — Encounter: Payer: Self-pay | Admitting: Internal Medicine

## 2019-02-18 ENCOUNTER — Telehealth: Payer: Self-pay

## 2019-02-18 NOTE — Telephone Encounter (Signed)
No answer  I called the pt to see if she is looking for recent paperwork that was dropped off.

## 2019-02-19 ENCOUNTER — Other Ambulatory Visit: Payer: Self-pay

## 2019-02-19 ENCOUNTER — Telehealth: Payer: Self-pay | Admitting: *Deleted

## 2019-02-19 ENCOUNTER — Inpatient Hospital Stay: Payer: BC Managed Care – PPO | Attending: Hematology

## 2019-02-19 DIAGNOSIS — D473 Essential (hemorrhagic) thrombocythemia: Secondary | ICD-10-CM | POA: Diagnosis present

## 2019-02-19 LAB — COMPREHENSIVE METABOLIC PANEL
ALT: 26 U/L (ref 0–44)
AST: 21 U/L (ref 15–41)
Albumin: 3.5 g/dL (ref 3.5–5.0)
Alkaline Phosphatase: 77 U/L (ref 38–126)
Anion gap: 10 (ref 5–15)
BUN: 20 mg/dL (ref 8–23)
CO2: 30 mmol/L (ref 22–32)
Calcium: 9.2 mg/dL (ref 8.9–10.3)
Chloride: 102 mmol/L (ref 98–111)
Creatinine, Ser: 1.28 mg/dL — ABNORMAL HIGH (ref 0.44–1.00)
GFR calc Af Amer: 51 mL/min — ABNORMAL LOW (ref >60)
GFR calc non Af Amer: 44 mL/min — ABNORMAL LOW (ref >60)
Glucose, Bld: 108 mg/dL — ABNORMAL HIGH (ref 70–99)
Potassium: 3.8 mmol/L (ref 3.5–5.1)
Sodium: 142 mmol/L (ref 135–145)
Total Bilirubin: 0.5 mg/dL (ref 0.3–1.2)
Total Protein: 7 g/dL (ref 6.5–8.1)

## 2019-02-19 LAB — CBC WITH DIFFERENTIAL/PLATELET
Abs Immature Granulocytes: 0.02 10*3/uL (ref 0.00–0.07)
Basophils Absolute: 0.1 10*3/uL (ref 0.0–0.1)
Basophils Relative: 1 %
Eosinophils Absolute: 0.2 10*3/uL (ref 0.0–0.5)
Eosinophils Relative: 4 %
HCT: 42.5 % (ref 36.0–46.0)
Hemoglobin: 14 g/dL (ref 12.0–15.0)
Immature Granulocytes: 0 %
Lymphocytes Relative: 26 %
Lymphs Abs: 1.4 10*3/uL (ref 0.7–4.0)
MCH: 30.8 pg (ref 26.0–34.0)
MCHC: 32.9 g/dL (ref 30.0–36.0)
MCV: 93.6 fL (ref 80.0–100.0)
Monocytes Absolute: 0.4 10*3/uL (ref 0.1–1.0)
Monocytes Relative: 7 %
Neutro Abs: 3.4 10*3/uL (ref 1.7–7.7)
Neutrophils Relative %: 62 %
Platelets: 451 10*3/uL — ABNORMAL HIGH (ref 150–400)
RBC: 4.54 MIL/uL (ref 3.87–5.11)
RDW: 16 % — ABNORMAL HIGH (ref 11.5–15.5)
WBC: 5.4 10*3/uL (ref 4.0–10.5)
nRBC: 0 % (ref 0.0–0.2)

## 2019-02-19 NOTE — Telephone Encounter (Signed)
Attempted call to patient. She is not available but can return this call in the morning regarding recent lab results.

## 2019-02-19 NOTE — Telephone Encounter (Signed)
-----   Message from Truitt Merle, MD sent at 02/19/2019  3:36 PM EDT ----- Please let pt know the lab results, no concerns, continue current dose Hydrea. I will schedule her next lab and f/u in 6 weeks  Thanks  Truitt Merle  02/19/2019

## 2019-02-20 ENCOUNTER — Telehealth: Payer: Self-pay | Admitting: *Deleted

## 2019-02-20 ENCOUNTER — Telehealth: Payer: Self-pay | Admitting: Hematology

## 2019-02-20 NOTE — Telephone Encounter (Signed)
-----   Message from Otila Kluver, RN sent at 02/20/2019  9:15 AM EDT ----- [place] to call yesterday ----- Message ----- From: Truitt Merle, MD Sent: 02/19/2019   3:36 PM EDT To: Otila Kluver, RN  Please let pt know the lab results, no concerns, continue current dose Hydrea. I will schedule her next lab and f/u in 6 weeks  Thanks  Truitt Merle  02/19/2019

## 2019-02-20 NOTE — Telephone Encounter (Signed)
Notified of message below

## 2019-02-20 NOTE — Telephone Encounter (Signed)
Scheduled f/u appt per sch msg. Called patient. No answer. Couldn't leave msg. Will mail printout.

## 2019-02-21 ENCOUNTER — Telehealth: Payer: Self-pay

## 2019-02-21 NOTE — Telephone Encounter (Signed)
Her FMLA forms have been done. They were in your stack earlier this week or last week. Can you see if they are up front? Or if they have been faxed?   RS

## 2019-02-21 NOTE — Telephone Encounter (Signed)
I haven't seen her [employer] forms and I asked Tianna and Curlene Dolphin did they take them out of my stack of work and they said no.  The forms haven't been faxed because Sara [name] said that her job hasn't received them and I checked up front and the forms aren't up there.

## 2019-02-21 NOTE — Telephone Encounter (Signed)
The pt called and said that she is checking on her [employer] forms.  I told the pt that I don't see the forms and that I see the coversheet that said the pt paid for the forms and it says that the papers are complete and that I will check with Dr. Baird Cancer on Monday which co-worker the forms were given to.

## 2019-02-21 NOTE — Telephone Encounter (Signed)
We will have to discuss Monday. You may put in the book

## 2019-03-02 ENCOUNTER — Other Ambulatory Visit: Payer: Self-pay | Admitting: Internal Medicine

## 2019-03-02 DIAGNOSIS — E1121 Type 2 diabetes mellitus with diabetic nephropathy: Secondary | ICD-10-CM

## 2019-03-07 ENCOUNTER — Other Ambulatory Visit: Payer: Self-pay

## 2019-03-07 ENCOUNTER — Telehealth: Payer: Self-pay

## 2019-03-07 DIAGNOSIS — D473 Essential (hemorrhagic) thrombocythemia: Secondary | ICD-10-CM

## 2019-03-07 MED ORDER — ANAGRELIDE HCL 0.5 MG PO CAPS: 0.5000 mg | ORAL_CAPSULE | Freq: Two times a day (BID) | ORAL | 1 refills | Status: DC

## 2019-03-07 MED FILL — ANAGRELIDE HCL 0.5 MG CAP: 0.5 | 30 days supply | Qty: 60 | Fill #0

## 2019-03-07 NOTE — Telephone Encounter (Signed)
Spoke with patient to let her know that [facility] has Anagrelide 0.5 mg capsules.  I have sent in a script for her to take one capsule twice daily.  She verbalized an understanding.

## 2019-03-13 ENCOUNTER — Encounter: Payer: Self-pay | Admitting: Internal Medicine

## 2019-03-13 ENCOUNTER — Other Ambulatory Visit: Payer: Self-pay

## 2019-03-13 ENCOUNTER — Ambulatory Visit: Payer: BC Managed Care – PPO | Admitting: Internal Medicine

## 2019-03-13 VITALS — BP 146/82 | HR 87 | Temp 98.4°F | Ht 61.4 in | Wt 212.0 lb

## 2019-03-13 DIAGNOSIS — Z23 Encounter for immunization: Secondary | ICD-10-CM

## 2019-03-13 DIAGNOSIS — I129 Hypertensive chronic kidney disease with stage 1 through stage 4 chronic kidney disease, or unspecified chronic kidney disease: Secondary | ICD-10-CM

## 2019-03-13 DIAGNOSIS — N182 Chronic kidney disease, stage 2 (mild): Secondary | ICD-10-CM | POA: Diagnosis not present

## 2019-03-13 DIAGNOSIS — F4322 Adjustment disorder with anxiety: Secondary | ICD-10-CM

## 2019-03-13 DIAGNOSIS — R1905 Periumbilic swelling, mass or lump: Secondary | ICD-10-CM

## 2019-03-13 DIAGNOSIS — Z79899 Other long term (current) drug therapy: Secondary | ICD-10-CM

## 2019-03-13 DIAGNOSIS — Z0000 Encounter for general adult medical examination without abnormal findings: Secondary | ICD-10-CM | POA: Diagnosis not present

## 2019-03-13 DIAGNOSIS — E1122 Type 2 diabetes mellitus with diabetic chronic kidney disease: Secondary | ICD-10-CM | POA: Diagnosis not present

## 2019-03-13 LAB — POCT URINALYSIS DIPSTICK
Bilirubin, UA: NEGATIVE
Glucose, UA: NEGATIVE
Ketones, UA: NEGATIVE
Leukocytes, UA: NEGATIVE
Nitrite, UA: NEGATIVE
Protein, UA: POSITIVE — AB
Spec Grav, UA: 1.025 (ref 1.010–1.025)
Urobilinogen, UA: 0.2 E.U./dL
pH, UA: 6 (ref 5.0–8.0)

## 2019-03-13 LAB — POCT UA - MICROALBUMIN
Albumin/Creatinine Ratio, Urine, POC: 300
Creatinine, POC: 300 mg/dL
Microalbumin Ur, POC: 80 mg/L

## 2019-03-13 MED ORDER — TETANUS-DIPHTH-ACELL PERTUSSIS 5-2.5-18.5 LF-MCG/0.5 IM SUSP
0.5000 mL | Freq: Once | INTRAMUSCULAR | Status: AC
  Administered 2019-03-13: 0.5 mL via INTRAMUSCULAR

## 2019-03-13 NOTE — Patient Instructions (Signed)

## 2019-03-14 ENCOUNTER — Encounter: Payer: Self-pay | Admitting: Internal Medicine

## 2019-03-14 LAB — VITAMIN B12: Vitamin B-12: 924 pg/mL (ref 232–1245)

## 2019-03-16 NOTE — Progress Notes (Signed)
Subjective:     Patient ID: Sara [name] , female    DOB: [birth year] , 63 y.o.   MRN: [id number]   Chief Complaint  Patient presents with  . Annual Exam  . Diabetes  . Hypertension    HPI  She presents today for a full physical exam. She is followed by Dr. Garwin Brothers for her GYN care.   Diabetes  She presents for her follow-up diabetic visit. She has type 2 diabetes mellitus. There are no hypoglycemic associated symptoms. Pertinent negatives for diabetes include no blurred vision and no chest pain. There are no hypoglycemic complications. Diabetic complications include heart disease. Risk factors for coronary artery disease include dyslipidemia, diabetes mellitus, hypertension, obesity, post-menopausal and sedentary lifestyle. She is compliant with treatment most of the time. She is following a diabetic diet. She never participates in exercise. An ACE inhibitor/angiotensin II receptor blocker is being taken. Eye exam is not current.  Hypertension  This is a chronic problem. The current episode started more than 1 year ago. The problem has been gradually improving since onset. The problem is uncontrolled. Pertinent negatives include no blurred vision, chest pain, palpitations or shortness of breath. The current treatment provides moderate improvement. Compliance problems include exercise.  Hypertensive end-organ damage includes CAD/MI.     Past Medical History:  Diagnosis Date  . Diabetes mellitus   . Gout   . Hyperlipemia   . Hypertension   . LVH (left ventricular hypertrophy)    12/21/09 Echo -mild asymmetric LVH,EF =>55%  . Morbid obesity ([place])   . OSA (obstructive sleep apnea)   . PAD (peripheral artery disease) ([place])   . Renal artery stenosis ([place]) 12/13/2010   PTCA and Stent - right  . Seizures ([place])   . Syncopal episodes   . Thrombocytosis (H[facility])      Family History  Problem Relation Age of Onset  . Heart failure Mother   . Diabetes Father   . Diabetes Sister   .  Hypertension Sister   . Hypertension Sister   . Hypertension Sister   . Breast cancer Maternal Aunt        2 maternal aunts had breast cancer.  . Breast cancer Maternal Grandmother      Current Outpatient Medications:  .  acetaminophen (TYLENOL) 325 MG tablet, Take 2 tablets (650 mg total) by mouth every 6 (six) hours as needed for mild pain. (Patient taking differently: Take 325 mg by mouth at bedtime as needed for mild pain. ), Disp: , Rfl:  .  atorvastatin (LIPITOR) 40 MG tablet, TAKE 1 TABLET BY MOUTH DAILY AT 6 PM, Disp: 90 tablet, Rfl: 2 .  B Complex Vitamins (B COMPLEX PO), Take 1 tablet by mouth daily. , Disp: , Rfl:  .  Cholecalciferol (VITAMIN D3) 2000 UNITS capsule, Take 2,000 Units by mouth daily. , Disp: , Rfl:  .  hydrochlorothiazide (MICROZIDE) 12.5 MG capsule, TAKE 1 CAPSULE(12.5 MG) BY MOUTH DAILY, Disp: 30 capsule, Rfl: 8 .  hydroxyurea (HYDREA) 500 MG capsule, Take 1 capsule daily except 2 capsules daily on Mondays and Thursdays. May take with food to minimize GI side effects., Disp: 40 capsule, Rfl: 5 .  metoprolol tartrate (LOPRESSOR) 50 MG tablet, TAKE 1 AND 1/2 TABLETS BY MOUTH TWICE A DAY, Disp: 90 tablet, Rfl: 5 .  OZEMPIC, 0.25 OR 0.5 MG/DOSE, 2 MG/1.5ML SOPN, INJECT 0.5MG  UNDER THE SKIN ON THE SAME DAY OF EACH WEEK IN THE ABDOMEN, THIGH, OR UPPER ARM( MAKE SURE TO ROTATE INJECTION  SITES), Disp: 4.5 mL, Rfl: 1 .  telmisartan (MICARDIS) 80 MG tablet, TAKE 1 TABLET BY MOUTH EVERY DAY, Disp: 90 tablet, Rfl: 1 .  [name] 100 MG TABS, 1/2 tablet in the morning and 1.5 tablets in the evening, Disp: 180 tablet, Rfl: 3 .  anagrelide ([name]) 0.5 MG capsule, Take 1 capsule (0.5 mg total) by mouth 2 (two) times daily. (Patient not taking: Reported on 03/13/2019), Disp: 60 capsule, Rfl: 1 .  Blood Pressure Monitoring (BLOOD PRESSURE CUFF) MISC, Check blood pressure 1-2 times a day 3 h after taking BP medication.  Dx- CAD        HTN., Disp: 1 each, Rfl: 0   Allergies  Allergen  Reactions  . Sulfa Antibiotics Itching     The patient states she uses post menopausal status for birth control. Last LMP was No LMP recorded. Patient has had a hysterectomy.. Negative for Dysmenorrhea Negative for: breast discharge, breast lump(s), breast pain and breast self exam. Associated symptoms include abnormal vaginal bleeding. Pertinent negatives include abnormal bleeding (hematology), anxiety, decreased libido, depression, difficulty falling sleep, dyspareunia, history of infertility, nocturia, sexual dysfunction, sleep disturbances, urinary incontinence, urinary urgency, vaginal discharge and vaginal itching. Diet regular.The patient states her exercise level is  minimal.   . The patient's tobacco use is:  Social History   Tobacco Use  Smoking Status Never Smoker  Smokeless Tobacco Never Used  . She has been exposed to passive smoke. The patient's alcohol use is:  Social History   Substance and Sexual Activity  Alcohol Use No  . Alcohol/week: 0.0 standard drinks  . Frequency: Never   Review of Systems  Constitutional: Negative.   HENT: Negative.   Eyes: Negative.  Negative for blurred vision.  Respiratory: Negative.  Negative for shortness of breath.   Cardiovascular: Negative for chest pain and palpitations.  Gastrointestinal: Negative.        She reports feeling lump in her belly. Does not hurt. Thinks she may have noticed it within the past month. NO change in bm.   Endocrine: Negative.   Genitourinary: Negative.   Musculoskeletal: Negative.   Skin: Negative.   Allergic/Immunologic: Negative.   Neurological: Negative.   Hematological: Negative.   Psychiatric/Behavioral: Negative.      Today's Vitals   03/13/19 1038  BP: (!) 146/82  Pulse: 87  Temp: 98.4 F (36.9 C)  TempSrc: Oral  Weight: 212 lb (96.2 kg)  Height: 5' 1.4" (1.56 m)   Body mass index is 39.54 kg/m.   Objective:  Physical Exam Vitals signs and nursing note reviewed.  Constitutional:       Appearance: Normal appearance.  HENT:     Head: Normocephalic and atraumatic.     Right Ear: Tympanic membrane, ear canal and external ear normal.     Left Ear: Tympanic membrane, ear canal and external ear normal.     Nose: Nose normal.     Mouth/Throat:     Mouth: Mucous membranes are moist.     Pharynx: Oropharynx is clear.  Eyes:     Extraocular Movements: Extraocular movements intact.     Conjunctiva/sclera: Conjunctivae normal.     Pupils: Pupils are equal, round, and reactive to light.  Neck:     Musculoskeletal: Normal range of motion and neck supple.  Cardiovascular:     Rate and Rhythm: Normal rate and regular rhythm.     Pulses: Normal pulses.          Dorsalis pedis pulses are 2+ on  the right side and 2+ on the left side.       Posterior tibial pulses are 2+ on the right side and 2+ on the left side.     Heart sounds: Normal heart sounds.  Pulmonary:     Effort: Pulmonary effort is normal.     Breath sounds: Normal breath sounds.  Chest:     Breasts:        Right: Normal. No swelling, bleeding, inverted nipple, mass, nipple discharge or skin change.        Left: Normal. No swelling, bleeding, inverted nipple, mass, nipple discharge or skin change.     Comments: Healed surgical sternal scar Abdominal:     General: Bowel sounds are normal.     Palpations: Abdomen is soft. There is mass.     Hernia: A hernia is present.     Comments: nontender mass located beneath umbilicus.  Supra-umbilical hernia, easily reducible  Genitourinary:    Comments: deferred Musculoskeletal:     Right foot: Normal range of motion. No deformity.     Left foot: Normal range of motion. No deformity.  Feet:     Right foot:     Protective Sensation: 5 sites tested. 5 sites sensed.     Skin integrity: Skin integrity normal.     Toenail Condition: Right toenails are normal.     Left foot:     Protective Sensation: 5 sites tested. 5 sites sensed.     Skin integrity: Skin integrity  normal.     Toenail Condition: Left toenails are normal.  Skin:    General: Skin is warm and dry.  Neurological:     General: No focal deficit present.     Mental Status: She is alert and oriented to person, place, and time.  Psychiatric:        Mood and Affect: Mood normal.        Behavior: Behavior normal.         Assessment And Plan:     1. Routine general medical examination at health care facility  A full exam was performed. Importance of monthly self breast exams was discussed with the patient. PATIENT HAS BEEN ADVISED TO GET 30-45 MINUTES REGULAR EXERCISE NO LESS THAN FOUR TO FIVE DAYS PER WEEK - BOTH WEIGHTBEARING EXERCISES AND AEROBIC ARE RECOMMENDED.  SHE IS ADVISED TO FOLLOW A HEALTHY DIET WITH AT LEAST SIX FRUITS/VEGGIES PER DAY, DECREASE INTAKE OF RED MEAT, AND TO INCREASE FISH INTAKE TO TWO DAYS PER WEEK.  MEATS/FISH SHOULD NOT BE FRIED, BAKED OR BROILED IS PREFERABLE.  I SUGGEST WEARING SPF 50 SUNSCREEN ON EXPOSED PARTS AND ESPECIALLY WHEN IN THE DIRECT SUNLIGHT FOR AN EXTENDED PERIOD OF TIME.  PLEASE AVOID FAST FOOD RESTAURANTS AND INCREASE YOUR WATER INTAKE.  - CBC with Differential  2. Type 2 diabetes mellitus with stage 2 chronic kidney disease, without long-term current use of insulin ([place])  Diabetic foot exam was performed. I DISCUSSED WITH THE PATIENT AT LENGTH REGARDING THE GOALS OF GLYCEMIC CONTROL AND POSSIBLE LONG-TERM COMPLICATIONS.  I  ALSO STRESSED THE IMPORTANCE OF COMPLIANCE WITH HOME GLUCOSE MONITORING, DIETARY RESTRICTIONS INCLUDING AVOIDANCE OF SUGARY DRINKS/PROCESSED FOODS,  ALONG WITH REGULAR EXERCISE.  I  ALSO STRESSED THE IMPORTANCE OF ANNUAL EYE EXAMS, SELF FOOT CARE AND COMPLIANCE WITH OFFICE VISITS.  - POCT Urinalysis Dipstick (81002) - POCT UA - Microalbumin - Ambulatory referral to Ophthalmology  3. Parenchymal renal hypertension, stage 1 through stage 4 or unspecified chronic kidney disease  Fair  control. She will continue with current meds.   She is encouraged to avoid adding salt to her foods. EKG performed, no new changes.   - EKG 12-Lead  4. Adjustment disorder with anxious mood  She has yet to hear back from Dr. Hayden Pedro office. I will have referral coordinator look into this.   5. Need for vaccination  - Tdap (BOOSTRIX) injection 0.5 mL  6. Periumbilical mass  I will refer her for u/s to further evaluate this lesion.   - US Abdomen Complete; Future  7. Drug therapy  - Vitamin B12        [name], MD    THE PATIENT IS ENCOURAGED TO PRACTICE SOCIAL DISTANCING DUE TO THE COVID-19 PANDEMIC.

## 2019-03-20 ENCOUNTER — Encounter: Payer: Self-pay | Admitting: Internal Medicine

## 2019-03-21 ENCOUNTER — Encounter: Payer: Self-pay | Admitting: Internal Medicine

## 2019-03-25 ENCOUNTER — Telehealth: Payer: Self-pay

## 2019-03-25 LAB — HM DIABETES EYE EXAM

## 2019-03-25 NOTE — Telephone Encounter (Signed)
Returned call to pt about note for returning to work. Left message for pt to return call

## 2019-03-26 ENCOUNTER — Telehealth: Payer: Self-pay

## 2019-03-26 NOTE — Telephone Encounter (Signed)
Left detailed message for her to call the pysch dr about her leave

## 2019-04-01 NOTE — Progress Notes (Signed)
[facility]   [phone] [phone]) [phone]   Clinic Follow up Note   Patient Care Team: [name], MD as PCP - General (Internal Medicine)  Date of Service:  04/04/2019  CHIEF COMPLAINT: follow up Essential Thrombocytosis  CURRENT THERAPY:  -Hydroxyurea 500 mg daily, started in 2010., increased to hydrea 1000mg  daily on Monday and Thursday, and 500mg  daily for the rest of week in 01/2015,  Aspirin 81 mg daily.  -Switched Hydrea to anagrelide 1 mg daily 07/2016. Held and was on hydrea from 01/2019-02/2019 due to Anagrelide on back order.   INTERVAL HISTORY:  Sara [name] is here for a follow up of ET. She presents to the clinic alone. She notes she has been doing well. She has been out of work since [place]. He tried Hydrea when out of Anagrelide. She has restarted Anagrelide late last month. She does not plans to retire this year. She would like a letter to continue pain medical leave.     REVIEW OF SYSTEMS:   Constitutional: Denies fevers, chills or abnormal weight loss Eyes: Denies blurriness of vision Ears, nose, mouth, throat, and face: Denies mucositis or sore throat Respiratory: Denies cough, dyspnea or wheezes Cardiovascular: Denies palpitation, chest discomfort or lower extremity swelling Gastrointestinal:  Denies nausea, heartburn or change in bowel habits Skin: Denies abnormal skin rashes Lymphatics: Denies new lymphadenopathy or easy bruising Neurological:Denies numbness, tingling or new weaknesses Behavioral/Psych: Mood is stable, no new changes  All other systems were reviewed with the patient and are negative.  MEDICAL HISTORY:  Past Medical History:  Diagnosis Date  . Diabetes mellitus   . Gout   . Hyperlipemia   . Hypertension   . LVH (left ventricular hypertrophy)    12/21/09 Echo -mild asymmetric LVH,EF =>55%  . Morbid obesity ([place])   . OSA (obstructive sleep apnea)   . PAD (peripheral artery disease) ([place])   . Renal  artery stenosis ([place]) 12/13/2010   PTCA and Stent - right  . Seizures ([place])   . Syncopal episodes   . Thrombocytosis ([place])     SURGICAL HISTORY: Past Surgical History:  Procedure Laterality Date  . BREAST CYST EXCISION     S/P Benign Right Breast Cyst Removal.  . BREAST REDUCTION SURGERY  1995   S/P Bilateral breast reduction  . [place]   Times  Two.  . CORONARY ARTERY BYPASS GRAFT N/A 04/21/2017   Procedure: CORONARY ARTERY BYPASS GRAFTING (CABG) x 2, USING LEFT MAMMARY ARTERY AND RIGHT GREATER SAPHENOUS VEIN HARVESTED ENDOSCOPICALLY;  Surgeon: [name], MD;  Location: [facility];  Service: Open Heart Surgery;  Laterality: N/A;  . EXTREMITY CYST EXCISION  1993   left wrist  . LEFT HEART CATH AND CORONARY ANGIOGRAPHY N/A 04/17/2017   Procedure: Left Heart Cath and Coronary Angiography;  Surgeon: Jettie Booze, MD;  Location: [facility];  Service: Cardiovascular;  Laterality: N/A;  . PARTIAL HYSTERECTOMY  1994  . renal artery stent placement Right   . TEE WITHOUT CARDIOVERSION N/A 04/21/2017   Procedure: TRANSESOPHAGEAL ECHOCARDIOGRAM (TEE);  Surgeon: [name], MD;  Location: [facility];  Service: Open Heart Surgery;  Laterality: N/A;    I have reviewed the social history and family history with the patient and they are unchanged from previous note.  ALLERGIES:  is allergic to sulfa antibiotics.  MEDICATIONS:  Current Outpatient Medications  Medication Sig Dispense Refill  . acetaminophen (TYLENOL) 325 MG tablet Take 2 tablets (  650 mg total) by mouth every 6 (six) hours as needed for mild pain. (Patient taking differently: Take 325 mg by mouth at bedtime as needed for mild pain. )    . anagrelide ([name]) 0.5 MG capsule Take 1 capsule (0.5 mg total) by mouth 2 (two) times daily. (Patient not taking: Reported on 03/13/2019) 60 capsule 1  . atorvastatin (LIPITOR) 40 MG tablet TAKE 1 TABLET BY MOUTH DAILY AT 6 PM 90 tablet 2  . B Complex Vitamins  (B COMPLEX PO) Take 1 tablet by mouth daily.     . Blood Pressure Monitoring (BLOOD PRESSURE CUFF) MISC Check blood pressure 1-2 times a day 3 h after taking BP medication.  Dx- CAD        HTN. 1 each 0  . Cholecalciferol (VITAMIN D3) 2000 UNITS capsule Take 2,000 Units by mouth daily.     . hydrochlorothiazide (MICROZIDE) 12.5 MG capsule TAKE 1 CAPSULE(12.5 MG) BY MOUTH DAILY 30 capsule 8  . hydroxyurea (HYDREA) 500 MG capsule Take 1 capsule daily except 2 capsules daily on Mondays and Thursdays. May take with food to minimize GI side effects. 40 capsule 5  . metoprolol tartrate (LOPRESSOR) 50 MG tablet TAKE 1 AND 1/2 TABLETS BY MOUTH TWICE A DAY 90 tablet 5  . OZEMPIC, 0.25 OR 0.5 MG/DOSE, 2 MG/1.5ML SOPN INJECT 0.5MG  UNDER THE SKIN ON THE SAME DAY OF EACH WEEK IN THE ABDOMEN, THIGH, OR UPPER ARM( MAKE SURE TO ROTATE INJECTION SITES) 4.5 mL 1  . telmisartan (MICARDIS) 80 MG tablet TAKE 1 TABLET BY MOUTH EVERY DAY 90 tablet 1  . [name] 100 MG TABS 1/2 tablet in the morning and 1.5 tablets in the evening 180 tablet 3   No current facility-administered medications for this visit.     PHYSICAL EXAMINATION: ECOG PERFORMANCE STATUS: 1 - Symptomatic but completely ambulatory  Vitals:   04/04/19 0948  BP: (!) 146/79  Pulse: 82  Resp: 18  Temp: 99.1 F (37.3 C)  SpO2: 98%   Filed Weights   04/04/19 0948  Weight: 209 lb 14.4 oz (95.2 kg)    GENERAL:alert, no distress and comfortable SKIN: skin color, texture, turgor are normal, no rashes or significant lesions EYES: normal, Conjunctiva are pink and non-injected, sclera clear  NECK: supple, thyroid normal size, non-tender, without nodularity LYMPH:  no palpable lymphadenopathy in the cervical, axillary  LUNGS: clear to auscultation and percussion with normal breathing effort HEART: regular rate & rhythm and no murmurs and no lower extremity edema ABDOMEN:abdomen soft, non-tender and normal bowel sounds Musculoskeletal:no cyanosis of  digits and no clubbing  NEURO: alert & oriented x 3 with fluent speech, no focal motor/sensory deficits  LABORATORY DATA:  I have reviewed the data as listed CBC Latest Ref Rng & Units 04/04/2019 02/19/2019 11/22/2018  WBC 4.0 - 10.5 K/uL 6.9 5.4 8.3  Hemoglobin 12.0 - 15.0 g/dL 13.2 14.0 13.8  Hematocrit 36.0 - 46.0 % 40.5 42.5 40.5  Platelets 150 - 400 K/uL 233 451(H) 409     CMP Latest Ref Rng & Units 02/19/2019 11/22/2018 08/21/2018  Glucose 70 - 99 mg/dL 108(H) 87 87  BUN 8 - 23 mg/dL 20 16 16   Creatinine 0.44 - 1.00 mg/dL 1.28(H) 1.06(H) 1.26(H)  Sodium 135 - 145 mmol/L 142 143 141  Potassium 3.5 - 5.1 mmol/L 3.8 4.0 3.8  Chloride 98 - 111 mmol/L 102 101 103  CO2 22 - 32 mmol/L 30 26 30   Calcium 8.9 - 10.3 mg/dL 9.2 9.6 9.6  Total Protein 6.5 - 8.1 g/dL 7.0 6.8 7.0  Total Bilirubin 0.3 - 1.2 mg/dL 0.5 0.4 0.6  Alkaline Phos 38 - 126 U/L 77 85 75  AST 15 - 41 U/L 21 24 24   ALT 0 - 44 U/L 26 36(H) 26      RADIOGRAPHIC STUDIES: I have personally reviewed the radiological images as listed and agreed with the findings in the report. No results found.   ASSESSMENT & PLAN:  SERAYA JOBST is a 63 y.o. female with   1. Essential Thrombocythemia. JAK2 (+)  -Diagnosed in 2010. JAK2 mutation was present. T -he patient underwent a bone marrow aspirate and biopsy on 01/20/2009 which yielded a specimen that was suboptimal for evaluation. However, megakaryocytes were abundant with normal morphology. There was no clustering or excess blasts seen. Platelet count on 01/20/2009 was 881.  -She did not tolerate hydrea well.  -The patient is being treated with aspirin and Anagrelide. Tolerating well.  -During Anagrelide backorder she was on Hydrea from 01/2019-02/2019. She restarted anagrelide 1mg  daily 2 weeks ago. Tolerating well.  -Labs revewied, CBC WNL. ET well controlled -Continue Anagrelide, I will refill 1mg  tablets, she will take once daily   -F/u in 6 months    2. Seizure  -continue meds, follow up with her neurologist.  3. Syncope episode -Unclear etiology. -I suspected the possibility of her syncope is probably not related to medications. -She denied dizziness or syncope on today's visit.   4. Diabetes -She has been seeing a nutritionist at church. -She monitors her sugar level regularly -She has intentionally lost about 17 lbs lately   5. HTN -continue meds and f/u  -BP at 146/79 today (04/04/19)  6. Coronary artery disease -Coronary artery disease status post bypass surgery grafting x 2 by Dr. Cyndia Bent in June 2018. -She completed cardiac rehab  7. Health Maintenance -She is due for a mammogram, she agrees to get it done this year  8. Abdominal wall hernia -She has a hernia in the abdominal wall just above the umbilical area. I advised her to avoid heavy lifting or strenuous coughing.   PLAN: -She plans to retire this year, I provided her with a letter to continue medical leave until then.  -Continue anagrelide 1 mg daily, I refilled today  -lab in 3 months  -Lab and f/u in 6 months    No problem-specific Assessment & Plan notes found for this encounter.   No orders of the defined types were placed in this encounter.  All questions were answered. The patient knows to call the clinic with any problems, questions or concerns. No barriers to learning was detected. I spent 15 minutes counseling the patient face to face. The total time spent in the appointment was 25 minutes and more than 50% was on counseling and review of test results     Truitt Merle, MD 04/04/2019   I, Joslyn Devon, am acting as scribe for Truitt Merle, MD.   I have reviewed the above documentation for accuracy and completeness, and I agree with the above.

## 2019-04-03 ENCOUNTER — Encounter: Payer: Self-pay | Admitting: Internal Medicine

## 2019-04-04 ENCOUNTER — Other Ambulatory Visit: Payer: Self-pay

## 2019-04-04 ENCOUNTER — Inpatient Hospital Stay: Payer: BC Managed Care – PPO | Attending: Hematology | Admitting: Hematology

## 2019-04-04 ENCOUNTER — Inpatient Hospital Stay: Payer: BC Managed Care – PPO

## 2019-04-04 ENCOUNTER — Encounter: Payer: Self-pay | Admitting: Hematology

## 2019-04-04 VITALS — BP 146/79 | HR 82 | Temp 99.1°F | Resp 18 | Ht 61.0 in | Wt 209.9 lb

## 2019-04-04 DIAGNOSIS — I10 Essential (primary) hypertension: Secondary | ICD-10-CM

## 2019-04-04 DIAGNOSIS — R569 Unspecified convulsions: Secondary | ICD-10-CM | POA: Diagnosis not present

## 2019-04-04 DIAGNOSIS — I2510 Atherosclerotic heart disease of native coronary artery without angina pectoris: Secondary | ICD-10-CM | POA: Diagnosis not present

## 2019-04-04 DIAGNOSIS — E119 Type 2 diabetes mellitus without complications: Secondary | ICD-10-CM | POA: Diagnosis not present

## 2019-04-04 DIAGNOSIS — D473 Essential (hemorrhagic) thrombocythemia: Secondary | ICD-10-CM

## 2019-04-04 DIAGNOSIS — Z0000 Encounter for general adult medical examination without abnormal findings: Secondary | ICD-10-CM

## 2019-04-04 DIAGNOSIS — K439 Ventral hernia without obstruction or gangrene: Secondary | ICD-10-CM | POA: Diagnosis not present

## 2019-04-04 DIAGNOSIS — R55 Syncope and collapse: Secondary | ICD-10-CM

## 2019-04-04 DIAGNOSIS — Z882 Allergy status to sulfonamides status: Secondary | ICD-10-CM | POA: Diagnosis not present

## 2019-04-04 DIAGNOSIS — Z79899 Other long term (current) drug therapy: Secondary | ICD-10-CM | POA: Diagnosis not present

## 2019-04-04 DIAGNOSIS — Z7982 Long term (current) use of aspirin: Secondary | ICD-10-CM

## 2019-04-04 LAB — CBC WITH DIFFERENTIAL/PLATELET
Abs Immature Granulocytes: 0.01 10*3/uL (ref 0.00–0.07)
Basophils Absolute: 0.1 10*3/uL (ref 0.0–0.1)
Basophils Relative: 1 %
Eosinophils Absolute: 0.2 10*3/uL (ref 0.0–0.5)
Eosinophils Relative: 2 %
HCT: 40.5 % (ref 36.0–46.0)
Hemoglobin: 13.2 g/dL (ref 12.0–15.0)
Immature Granulocytes: 0 %
Lymphocytes Relative: 23 %
Lymphs Abs: 1.6 10*3/uL (ref 0.7–4.0)
MCH: 31.6 pg (ref 26.0–34.0)
MCHC: 32.6 g/dL (ref 30.0–36.0)
MCV: 96.9 fL (ref 80.0–100.0)
Monocytes Absolute: 0.4 10*3/uL (ref 0.1–1.0)
Monocytes Relative: 5 %
Neutro Abs: 4.7 10*3/uL (ref 1.7–7.7)
Neutrophils Relative %: 69 %
Platelets: 233 10*3/uL (ref 150–400)
RBC: 4.18 MIL/uL (ref 3.87–5.11)
RDW: 15.8 % — ABNORMAL HIGH (ref 11.5–15.5)
WBC: 6.9 10*3/uL (ref 4.0–10.5)
nRBC: 0 % (ref 0.0–0.2)

## 2019-04-05 ENCOUNTER — Telehealth: Payer: Self-pay | Admitting: Hematology

## 2019-04-05 NOTE — Telephone Encounter (Signed)
Scheduled appt per 6/11 los. A calendar will be mailed out. °

## 2019-04-06 ENCOUNTER — Other Ambulatory Visit: Payer: Self-pay | Admitting: Cardiovascular Disease

## 2019-04-14 ENCOUNTER — Encounter: Payer: Self-pay | Admitting: Internal Medicine

## 2019-04-14 MED FILL — ANAGRELIDE HCL 0.5 MG CAP: 0.5 | 30 days supply | Qty: 60 | Fill #1

## 2019-04-19 LAB — HM MAMMOGRAPHY: HM Mammogram: NORMAL (ref 0–4)

## 2019-04-30 ENCOUNTER — Encounter: Payer: Self-pay | Admitting: Internal Medicine

## 2019-05-06 ENCOUNTER — Encounter: Payer: Self-pay | Admitting: Internal Medicine

## 2019-05-08 ENCOUNTER — Encounter: Payer: Self-pay | Admitting: Internal Medicine

## 2019-05-08 ENCOUNTER — Other Ambulatory Visit: Payer: Self-pay | Admitting: Cardiovascular Disease

## 2019-05-15 ENCOUNTER — Other Ambulatory Visit: Payer: Self-pay | Admitting: Hematology

## 2019-05-15 DIAGNOSIS — D473 Essential (hemorrhagic) thrombocythemia: Secondary | ICD-10-CM

## 2019-05-15 MED FILL — ANAGRELIDE HCL 0.5 MG CAP: 0.5 | 30 days supply | Qty: 60 | Fill #1

## 2019-05-16 ENCOUNTER — Other Ambulatory Visit: Payer: Self-pay

## 2019-05-16 ENCOUNTER — Telehealth: Payer: Self-pay | Admitting: Cardiovascular Disease

## 2019-05-16 DIAGNOSIS — D473 Essential (hemorrhagic) thrombocythemia: Secondary | ICD-10-CM

## 2019-05-16 MED ORDER — ANAGRELIDE HCL 0.5 MG PO CAPS: ORAL_CAPSULE | ORAL | 1 refills | Status: DC

## 2019-05-16 NOTE — Telephone Encounter (Signed)
Spoke to pt. Informed that message with consent has been sent to her MyChart. Asked pt to review this prior to her appointment with Dr. Sallyanne Kuster tomorrow at 8 AM.   Pt stated she does have a smartphone, but would like to do telephone visit this time. Pt would like to be called on her home number. Pt stated she does not have a blood pressure cuff at home, but does have a weight scale and will have her weight ready when she is called in the morning.  Pt verbalized thanks for the call.

## 2019-05-16 NOTE — Telephone Encounter (Signed)
LVM for pt to call and give verbal consent for Vitrual Visit on 05-17-19.

## 2019-05-17 ENCOUNTER — Telehealth (INDEPENDENT_AMBULATORY_CARE_PROVIDER_SITE_OTHER): Payer: BC Managed Care – PPO | Admitting: Cardiovascular Disease

## 2019-05-17 ENCOUNTER — Encounter: Payer: Self-pay | Admitting: Cardiovascular Disease

## 2019-05-17 VITALS — Ht 61.0 in | Wt 209.0 lb

## 2019-05-17 DIAGNOSIS — I10 Essential (primary) hypertension: Secondary | ICD-10-CM | POA: Diagnosis not present

## 2019-05-17 DIAGNOSIS — I2510 Atherosclerotic heart disease of native coronary artery without angina pectoris: Secondary | ICD-10-CM | POA: Diagnosis not present

## 2019-05-17 DIAGNOSIS — Z8679 Personal history of other diseases of the circulatory system: Secondary | ICD-10-CM

## 2019-05-17 DIAGNOSIS — I5032 Chronic diastolic (congestive) heart failure: Secondary | ICD-10-CM

## 2019-05-17 DIAGNOSIS — E119 Type 2 diabetes mellitus without complications: Secondary | ICD-10-CM

## 2019-05-17 DIAGNOSIS — Z951 Presence of aortocoronary bypass graft: Secondary | ICD-10-CM

## 2019-05-17 DIAGNOSIS — G4733 Obstructive sleep apnea (adult) (pediatric): Secondary | ICD-10-CM

## 2019-05-17 DIAGNOSIS — E7800 Pure hypercholesterolemia, unspecified: Secondary | ICD-10-CM

## 2019-05-17 NOTE — Progress Notes (Signed)
Virtual Visit via Telephone Note   This visit type was conducted due to national recommendations for restrictions regarding the COVID-19 Pandemic (e.g. social distancing) in an effort to limit this patient's exposure and mitigate transmission in our community.  Due to her co-morbid illnesses, this patient is at least at moderate risk for complications without adequate follow up.  This format is felt to be most appropriate for this patient at this time.  The patient did not have access to video technology/had technical difficulties with video requiring transitioning to audio format only (telephone).  All issues noted in this document were discussed and addressed.  No physical exam could be performed with this format.  Please refer to the patient's chart for her  consent to telehealth for [facility].   Date:  05/17/2019   ID:  Sara [name], DOB Jun 22, 1956, MRN [id number]  Patient Location: Home Provider Location: Home  PCP:  [name], MD  Cardiologist:  Haydon Dorris Electrophysiologist:  None   Evaluation Performed:  Follow-Up Visit  Chief Complaint:  CAD  History of Present Illness:    Sara [name] is a 63 y.o. female with history of artery disease (NSTEMI April 15, 2017, LIMA to LAD and SVG to diagonal bypass surgery on April 21, 2017).  Mild ischemic cardiomyopathy (EF 45-50%), peripheral arterial disease (renal artery stenosis with stent 2012), morbid obesity, diabetes mellitus, hyperlipidemia, hypertension, obstructive sleep apnea on CPAP, essential thrombocythemia and absence seizures, returning for routine follow-up.  The patient specifically denies any chest pain at rest exertion, dyspnea at rest or with exertion, orthopnea, paroxysmal nocturnal dyspnea, syncope, palpitations, focal neurological deficits, intermittent claudication, lower extremity edema, unexplained weight gain, cough, hemoptysis or wheezing.  The patient does not have symptoms concerning for COVID-19  infection (fever, chills, cough, or new shortness of breath). She is very worried about the pandemic and is home on medical leave due to her numerous comorbid conditions.   Past Medical History:  Diagnosis Date  . Diabetes mellitus   . Gout   . Hyperlipemia   . Hypertension   . LVH (left ventricular hypertrophy)    12/21/09 Echo -mild asymmetric LVH,EF =>55%  . Morbid obesity ([place])   . OSA (obstructive sleep apnea)   . PAD (peripheral artery disease) ([place])   . Renal artery stenosis ([place]) 12/13/2010   PTCA and Stent - right  . Seizures ([place])   . Syncopal episodes   . Thrombocytosis ([place])    Past Surgical History:  Procedure Laterality Date  . BREAST CYST EXCISION     S/P Benign Right Breast Cyst Removal.  . BREAST REDUCTION SURGERY  1995   S/P Bilateral breast reduction  . [place]   Times  Two.  . CORONARY ARTERY BYPASS GRAFT N/A 04/21/2017   Procedure: CORONARY ARTERY BYPASS GRAFTING (CABG) x 2, USING LEFT MAMMARY ARTERY AND RIGHT GREATER SAPHENOUS VEIN HARVESTED ENDOSCOPICALLY;  Surgeon: [name], MD;  Location: [facility];  Service: Open Heart Surgery;  Laterality: N/A;  . EXTREMITY CYST EXCISION  1993   left wrist  . LEFT HEART CATH AND CORONARY ANGIOGRAPHY N/A 04/17/2017   Procedure: Left Heart Cath and Coronary Angiography;  Surgeon: Jettie Booze, MD;  Location: [facility];  Service: Cardiovascular;  Laterality: N/A;  . PARTIAL HYSTERECTOMY  1994  . renal artery stent placement Right   . TEE WITHOUT CARDIOVERSION N/A 04/21/2017   Procedure: TRANSESOPHAGEAL ECHOCARDIOGRAM (TEE);  Surgeon: [name], MD;  Location:  [facility] OR;  Service: Open Heart Surgery;  Laterality: N/A;     Current Meds  Medication Sig  . acetaminophen (TYLENOL) 325 MG tablet Take 2 tablets (650 mg total) by mouth every 6 (six) hours as needed for mild pain. (Patient taking differently: Take 325 mg by mouth at bedtime as needed for mild pain. )  . anagrelide  ([name]) 0.5 MG capsule TAKE 1 CAPSULE BY MOUTH 2 TIMES DAILY.  Marland Kitchen aspirin 325 MG tablet Take 325 mg by mouth daily.  Marland Kitchen atorvastatin (LIPITOR) 40 MG tablet TAKE 1 TABLET BY MOUTH DAILY AT 6 PM  . B Complex Vitamins (B COMPLEX PO) Take 1 tablet by mouth daily.   . Cholecalciferol (VITAMIN D3) 2000 UNITS capsule Take 2,000 Units by mouth daily.   Marland Kitchen escitalopram (LEXAPRO) 20 MG tablet Take 20 mg by mouth daily.  . hydrochlorothiazide (MICROZIDE) 12.5 MG capsule TAKE 1 CAPSULE(12.5 MG) BY MOUTH DAILY  . metoprolol tartrate (LOPRESSOR) 50 MG tablet TAKE 1 AND 1/2 TABLETS BY MOUTH TWICE A DAY  . OZEMPIC, 0.25 OR 0.5 MG/DOSE, 2 MG/1.5ML SOPN INJECT 0.5MG  UNDER THE SKIN ON THE SAME DAY OF EACH WEEK IN THE ABDOMEN, THIGH, OR UPPER ARM( MAKE SURE TO ROTATE INJECTION SITES)  . telmisartan (MICARDIS) 80 MG tablet TAKE 1 TABLET BY MOUTH EVERY DAY  . [name] 100 MG TABS 1/2 tablet in the morning and 1.5 tablets in the evening     Allergies:   Sulfa antibiotics   Social History   Tobacco Use  . Smoking status: Never Smoker  . Smokeless tobacco: Never Used  Substance Use Topics  . Alcohol use: No    Alcohol/week: 0.0 standard drinks    Frequency: Never  . Drug use: No     Family Hx: The patient's family history includes Breast cancer in her maternal aunt and maternal grandmother; Diabetes in her father and sister; Heart failure in her mother; Hypertension in her sister, sister, and sister.  ROS:   Please see the history of present illness.     All other systems reviewed and are negative.   Prior CV studies:   The following studies were reviewed today:  Labs/Other Tests and Data Reviewed:    EKG:  An ECG dated 03/13/2019 was personally reviewed today and demonstrated:  NSR, nonspecific ST-T changes  Recent Labs: 11/22/2018: TSH 2.060 02/19/2019: ALT 26; BUN 20; Creatinine, Ser 1.28; Potassium 3.8; Sodium 142 04/04/2019: Hemoglobin 13.2; Platelets 233   Recent Lipid Panel Lab Results   Component Value Date/Time   CHOL 99 (L) 11/22/2018 05:14 PM   TRIG 42 11/22/2018 05:14 PM   HDL 44 11/22/2018 05:14 PM   CHOLHDL 2.3 11/22/2018 05:14 PM   CHOLHDL 2.7 04/17/2017 10:00 AM   LDLCALC 47 11/22/2018 05:14 PM    Wt Readings from Last 3 Encounters:  05/17/19 209 lb (94.8 kg)  04/04/19 209 lb 14.4 oz (95.2 kg)  03/13/19 212 lb (96.2 kg)     Objective:    Vital Signs:  Ht 5\' 1"  (1.549 m)   Wt 209 lb (94.8 kg)   BMI 39.49 kg/m    VITAL SIGNS:  reviewed unable to examine  ASSESSMENT & PLAN:    1. CAD s/p CABG    2. Chronic diastolic congestive heart failure ([place])   3. Essential hypertension   4. OSA (obstructive sleep apnea)   5. Type 2 diabetes mellitus without complication, without long-term current use of insulin ([place])   6. Hypercholesterolemia   7. Morbid  obesity ([place])      1. CAD: no angina. 2. CHF: appears to be euvolemic.  3. HTN: unable to check today.  4. OSA: compliant w CPAP. Sees Dr. Jannifer Franklin.  5. DM: excellent control on GLP-1 agonist. Hgb A1c now< 6%. 6. HLP: excellent lipid parameters. Target LDL<70 achieved. 7. Obesity: weight loss is still an important objective. 8. Hx of renal artery stent: if BP and renal function are OK, will forego imaging this year, due to coronavirus pandemic. 9.  Essential thrombocythemia: back on anagrelide, hydroxyurea.  COVID-19 Education: The signs and symptoms of COVID-19 were discussed with the patient and how to seek care for testing (follow up with PCP or arrange E-visit).  The importance of social distancing was discussed today.  Time:   Today, I have spent 16 minutes with the patient with telehealth technology discussing the above problems.     Medication Adjustments/Labs and Tests Ordered: Current medicines are reviewed at length with the patient today.  Concerns regarding medicines are outlined above.   Tests Ordered: No orders of the defined types were placed in this encounter.   Medication  Changes: No orders of the defined types were placed in this encounter.   Follow Up:  Virtual Visit or In Person 1 year  Signed, Sanda Klein, MD  05/17/2019 8:14 AM    [place]

## 2019-05-17 NOTE — Patient Instructions (Signed)
Medication Instructions:  Your physician recommends that you continue on your current medications as directed. Please refer to the Current Medication list given to you today.  If you need a refill on your cardiac medications before your next appointment, please call your pharmacy.   Lab work: None ordered If you have labs (blood work) drawn today and your tests are completely normal, you will receive your results only by: [facility] (if you have MyChart) OR A paper copy in the mail If you have any lab test that is abnormal or we need to change your treatment, we will call you to review the results.  Testing/Procedures: None ordered  Follow-Up: At [facility], you and your health needs are our priority.  As part of our continuing mission to provide you with exceptional heart care, we have created designated Provider Care Teams.  These Care Teams include your primary Cardiologist (physician) and Advanced Practice Providers (APPs -  Physician Assistants and Nurse Practitioners) who all work together to provide you with the care you need, when you need it. You will need a follow up appointment in 12 months.  Please call our office 2 months in advance to schedule this appointment.  You may see Mihai Croitoru. MD or one of the following Advanced Practice Providers on your designated Care Team: Almyra Deforest, PA-C Fabian Sharp, [place]

## 2019-05-20 ENCOUNTER — Encounter: Payer: Self-pay | Admitting: Internal Medicine

## 2019-05-20 ENCOUNTER — Ambulatory Visit: Payer: BC Managed Care – PPO | Admitting: Internal Medicine

## 2019-05-20 ENCOUNTER — Other Ambulatory Visit: Payer: Self-pay

## 2019-05-20 VITALS — BP 116/88 | HR 78 | Temp 98.1°F | Ht 61.0 in | Wt 207.6 lb

## 2019-05-20 DIAGNOSIS — E1122 Type 2 diabetes mellitus with diabetic chronic kidney disease: Secondary | ICD-10-CM | POA: Diagnosis not present

## 2019-05-20 DIAGNOSIS — N182 Chronic kidney disease, stage 2 (mild): Secondary | ICD-10-CM

## 2019-05-20 DIAGNOSIS — I2510 Atherosclerotic heart disease of native coronary artery without angina pectoris: Secondary | ICD-10-CM | POA: Diagnosis not present

## 2019-05-20 DIAGNOSIS — I1310 Hypertensive heart and chronic kidney disease without heart failure, with stage 1 through stage 4 chronic kidney disease, or unspecified chronic kidney disease: Secondary | ICD-10-CM | POA: Diagnosis not present

## 2019-05-20 DIAGNOSIS — Z6839 Body mass index (BMI) 39.0-39.9, adult: Secondary | ICD-10-CM

## 2019-05-20 NOTE — Progress Notes (Addendum)
Subjective:     Patient ID: Sara [name] , female    DOB: May 02, 1956 , 63 y.o.   MRN: [id number]   Chief Complaint  Patient presents with  . Hypertension  . Diabetes    HPI  Hypertension This is a chronic problem. The current episode started more than 1 year ago. The problem has been gradually improving since onset. The problem is uncontrolled. Pertinent negatives include no blurred vision, chest pain, palpitations or shortness of breath. The current treatment provides moderate improvement. Compliance problems include exercise.  Hypertensive end-organ damage includes CAD/MI.  Diabetes She presents for her follow-up diabetic visit. She has type 2 diabetes mellitus. There are no hypoglycemic associated symptoms. Pertinent negatives for diabetes include no blurred vision and no chest pain. There are no hypoglycemic complications. Diabetic complications include heart disease. Risk factors for coronary artery disease include dyslipidemia, diabetes mellitus, hypertension, obesity, post-menopausal and sedentary lifestyle. She is compliant with treatment most of the time. She is following a diabetic diet. She never participates in exercise. An ACE inhibitor/angiotensin II receptor blocker is being taken. Eye exam is not current.     Past Medical History:  Diagnosis Date  . Diabetes mellitus   . Gout   . Hyperlipemia   . Hypertension   . LVH (left ventricular hypertrophy)    12/21/09 Echo -mild asymmetric LVH,EF =>55%  . Morbid obesity ([place])   . OSA (obstructive sleep apnea)   . PAD (peripheral artery disease) ([place])   . Renal artery stenosis ([place]) 12/13/2010   PTCA and Stent - right  . Seizures ([place])   . Syncopal episodes   . Thrombocytosis ([facility])      Family History  Problem Relation Age of Onset  . Heart failure Mother   . Diabetes Father   . Diabetes Sister   . Hypertension Sister   . Hypertension Sister   . Hypertension Sister   . Breast cancer Maternal Aunt        2  maternal aunts had breast cancer.  . Breast cancer Maternal Grandmother      Current Outpatient Medications:  .  acetaminophen (TYLENOL) 325 MG tablet, Take 2 tablets (650 mg total) by mouth every 6 (six) hours as needed for mild pain. (Patient taking differently: Take 325 mg by mouth at bedtime as needed for mild pain. ), Disp: , Rfl:  .  anagrelide ([name]) 0.5 MG capsule, TAKE 1 CAPSULE BY MOUTH 2 TIMES DAILY., Disp: 60 capsule, Rfl: 1 .  aspirin 325 MG tablet, Take 325 mg by mouth daily., Disp: , Rfl:  .  atorvastatin (LIPITOR) 40 MG tablet, TAKE 1 TABLET BY MOUTH DAILY AT 6 PM, Disp: 90 tablet, Rfl: 0 .  B Complex Vitamins (B COMPLEX PO), Take 1 tablet by mouth daily. , Disp: , Rfl:  .  Cholecalciferol (VITAMIN D3) 2000 UNITS capsule, Take 2,000 Units by mouth daily. , Disp: , Rfl:  .  escitalopram (LEXAPRO) 20 MG tablet, Take 20 mg by mouth daily., Disp: , Rfl:  .  hydrochlorothiazide (MICROZIDE) 12.5 MG capsule, TAKE 1 CAPSULE(12.5 MG) BY MOUTH DAILY, Disp: 30 capsule, Rfl: 1 .  metoprolol tartrate (LOPRESSOR) 50 MG tablet, TAKE 1 AND 1/2 TABLETS BY MOUTH TWICE A DAY, Disp: 90 tablet, Rfl: 5 .  OZEMPIC, 0.25 OR 0.5 MG/DOSE, 2 MG/1.5ML SOPN, INJECT 0.'5MG'$  UNDER THE SKIN ON THE SAME DAY OF EACH WEEK IN THE ABDOMEN, THIGH, OR UPPER ARM( MAKE SURE TO ROTATE INJECTION SITES), Disp: 4.5 mL, Rfl: 1 .  telmisartan (MICARDIS) 80 MG tablet, TAKE 1 TABLET BY MOUTH EVERY DAY, Disp: 90 tablet, Rfl: 1 .  [name] 100 MG TABS, 1/2 tablet in the morning and 1.5 tablets in the evening (Patient taking differently: 1 tablet in the morning and 1 tablets in the evening), Disp: 180 tablet, Rfl: 3 .  Blood Pressure Monitoring (BLOOD PRESSURE CUFF) MISC, Check blood pressure 1-2 times a day 3 h after taking BP medication.  Dx- CAD        HTN., Disp: 1 each, Rfl: 0   Allergies  Allergen Reactions  . Sulfa Antibiotics Itching     Review of Systems  Constitutional: Negative.   Eyes: Negative for blurred vision.   Respiratory: Negative.  Negative for shortness of breath.   Cardiovascular: Negative.  Negative for chest pain and palpitations.  Gastrointestinal: Negative.   Neurological: Negative.   Psychiatric/Behavioral: Negative.      Today's Vitals   05/20/19 1149  BP: 116/88  Pulse: 78  Temp: 98.1 F (36.7 C)  TempSrc: Oral  Weight: 207 lb 9.6 oz (94.2 kg)  Height: '5\' 1"'$  (1.549 m)  PainSc: 0-No pain   Body mass index is 39.23 kg/m.   Objective:  Physical Exam Vitals signs and nursing note reviewed.  Constitutional:      Appearance: Normal appearance.  HENT:     Head: Normocephalic and atraumatic.  Cardiovascular:     Rate and Rhythm: Normal rate and regular rhythm.     Heart sounds: Normal heart sounds.  Pulmonary:     Effort: Pulmonary effort is normal.     Breath sounds: Normal breath sounds.  Skin:    General: Skin is warm.  Neurological:     General: No focal deficit present.     Mental Status: She is alert.  Psychiatric:        Mood and Affect: Mood normal.        Behavior: Behavior normal.         Assessment And Plan:     1. Hypertensive heart and renal disease with renal failure, stage 1 through stage 4 or unspecified chronic kidney disease, without heart failure ([place])  Fair control. She will continue with current meds. She is encouraged to avoid adding salt to her foods.   2. Type 2 diabetes mellitus with stage 2 chronic kidney disease, without long-term current use of insulin ([facility])  Chronic. She is encouraged to check her sugars regularly. She is also encouraged to exercise no less than 150 minutes per week.   - BMP8+EGFR - Hemoglobin A1c  3. CAD s/p CABG   Chronic, yet stable. She had recent virtual visit with her cardiologist.   4. Class 2 severe obesity due to excess calories with serious comorbidity and body mass index (BMI) of 39.0 to 39.9 in adult [facility])  Importance of achieving optimal weight to decrease risk of cardiovascular disease and  cancers was discussed with the patient in full detail. She is encouraged to start slowly - start with 10 minutes twice daily at least three to four days per week and to gradually build to 30 minutes five days weekly. She was given tips to incorporate more activity into her daily routine - take stairs when possible, park farther away from grocery stores, etc.    [name], MD    THE PATIENT IS ENCOURAGED TO PRACTICE SOCIAL DISTANCING DUE TO THE COVID-19 PANDEMIC.

## 2019-05-20 NOTE — Patient Instructions (Signed)

## 2019-05-21 LAB — HEMOGLOBIN A1C
Est. average glucose Bld gHb Est-mCnc: 120 mg/dL
Hgb A1c MFr Bld: 5.8 % — ABNORMAL HIGH (ref 4.8–5.6)

## 2019-05-21 LAB — BMP8+EGFR
BUN/Creatinine Ratio: 13 (ref 12–28)
BUN: 17 mg/dL (ref 8–27)
CO2: 25 mmol/L (ref 20–29)
Calcium: 9.9 mg/dL (ref 8.7–10.3)
Chloride: 101 mmol/L (ref 96–106)
Creatinine, Ser: 1.35 mg/dL — ABNORMAL HIGH (ref 0.57–1.00)
GFR calc Af Amer: 48 mL/min/{1.73_m2} — ABNORMAL LOW (ref >59)
GFR calc non Af Amer: 42 mL/min/{1.73_m2} — ABNORMAL LOW (ref >59)
Glucose: 96 mg/dL (ref 65–99)
Potassium: 4.2 mmol/L (ref 3.5–5.2)
Sodium: 142 mmol/L (ref 134–144)

## 2019-05-22 ENCOUNTER — Other Ambulatory Visit: Payer: Self-pay

## 2019-05-22 MED ORDER — ONETOUCH VERIO VI STRP: ORAL_STRIP | 2 refills | Status: DC

## 2019-05-22 MED ORDER — ONETOUCH DELICA LANCETS 33G MISC: 2 refills | Status: DC

## 2019-05-22 MED ORDER — ONETOUCH VERIO IQ SYSTEM W/DEVICE KIT: PACK | 1 refills | Status: DC

## 2019-05-23 ENCOUNTER — Encounter: Payer: Self-pay | Admitting: Internal Medicine

## 2019-05-29 ENCOUNTER — Encounter: Payer: Self-pay | Admitting: Internal Medicine

## 2019-06-13 ENCOUNTER — Telehealth: Payer: Self-pay

## 2019-06-13 ENCOUNTER — Encounter: Payer: Self-pay | Admitting: Hematology

## 2019-06-13 DIAGNOSIS — D473 Essential (hemorrhagic) thrombocythemia: Secondary | ICD-10-CM

## 2019-06-13 MED ORDER — ANAGRELIDE HCL 0.5 MG PO CAPS: ORAL_CAPSULE | ORAL | 1 refills | Status: DC

## 2019-06-13 MED FILL — ANAGRELIDE HCL 0.5 MG CAP: 0.5 | 30 days supply | Qty: 60 | Fill #0

## 2019-06-13 NOTE — Telephone Encounter (Signed)
Patient calls requesting Dr. Burr Medico to write a letter to take her out of work through November of this year.    [phone]

## 2019-06-14 ENCOUNTER — Telehealth: Payer: Self-pay

## 2019-06-14 NOTE — Telephone Encounter (Signed)
Spoke to patient letting her know that the Anagrelide was sent into [place]  They have in stock.  She also states she has another doctor that has written her a letter for work.

## 2019-06-14 NOTE — Telephone Encounter (Signed)
Error - duplicate

## 2019-07-02 ENCOUNTER — Encounter: Payer: Self-pay | Admitting: Internal Medicine

## 2019-07-05 ENCOUNTER — Inpatient Hospital Stay: Payer: BC Managed Care – PPO | Attending: Hematology

## 2019-07-05 ENCOUNTER — Other Ambulatory Visit: Payer: Self-pay

## 2019-07-05 DIAGNOSIS — Z0000 Encounter for general adult medical examination without abnormal findings: Secondary | ICD-10-CM

## 2019-07-05 DIAGNOSIS — D473 Essential (hemorrhagic) thrombocythemia: Secondary | ICD-10-CM | POA: Diagnosis present

## 2019-07-05 LAB — CBC WITH DIFFERENTIAL/PLATELET
Abs Immature Granulocytes: 0.04 10*3/uL (ref 0.00–0.07)
Basophils Absolute: 0.1 10*3/uL (ref 0.0–0.1)
Basophils Relative: 1 %
Eosinophils Absolute: 0.3 10*3/uL (ref 0.0–0.5)
Eosinophils Relative: 4 %
HCT: 38.4 % (ref 36.0–46.0)
Hemoglobin: 13 g/dL (ref 12.0–15.0)
Immature Granulocytes: 1 %
Lymphocytes Relative: 23 %
Lymphs Abs: 1.7 10*3/uL (ref 0.7–4.0)
MCH: 31.2 pg (ref 26.0–34.0)
MCHC: 33.9 g/dL (ref 30.0–36.0)
MCV: 92.1 fL (ref 80.0–100.0)
Monocytes Absolute: 0.4 10*3/uL (ref 0.1–1.0)
Monocytes Relative: 6 %
Neutro Abs: 4.7 10*3/uL (ref 1.7–7.7)
Neutrophils Relative %: 65 %
Platelets: 274 10*3/uL (ref 150–400)
RBC: 4.17 MIL/uL (ref 3.87–5.11)
RDW: 11.7 % (ref 11.5–15.5)
WBC: 7.2 10*3/uL (ref 4.0–10.5)
nRBC: 0 % (ref 0.0–0.2)

## 2019-07-05 LAB — COMPREHENSIVE METABOLIC PANEL
ALT: 18 U/L (ref 0–44)
AST: 18 U/L (ref 15–41)
Albumin: 3.4 g/dL — ABNORMAL LOW (ref 3.5–5.0)
Alkaline Phosphatase: 75 U/L (ref 38–126)
Anion gap: 5 (ref 5–15)
BUN: 16 mg/dL (ref 8–23)
CO2: 31 mmol/L (ref 22–32)
Calcium: 9.4 mg/dL (ref 8.9–10.3)
Chloride: 101 mmol/L (ref 98–111)
Creatinine, Ser: 1.37 mg/dL — ABNORMAL HIGH (ref 0.44–1.00)
GFR calc Af Amer: 47 mL/min — ABNORMAL LOW (ref >60)
GFR calc non Af Amer: 41 mL/min — ABNORMAL LOW (ref >60)
Glucose, Bld: 100 mg/dL — ABNORMAL HIGH (ref 70–99)
Potassium: 3.8 mmol/L (ref 3.5–5.1)
Sodium: 137 mmol/L (ref 135–145)
Total Bilirubin: 0.6 mg/dL (ref 0.3–1.2)
Total Protein: 6.5 g/dL (ref 6.5–8.1)

## 2019-07-06 ENCOUNTER — Encounter: Payer: Self-pay | Admitting: Hematology

## 2019-07-06 ENCOUNTER — Other Ambulatory Visit: Payer: Self-pay | Admitting: Cardiovascular Disease

## 2019-07-08 ENCOUNTER — Other Ambulatory Visit: Payer: Self-pay

## 2019-07-08 MED ORDER — HYDROCHLOROTHIAZIDE 12.5 MG PO CAPS: ORAL_CAPSULE | ORAL | 1 refills | Status: DC

## 2019-07-08 MED ORDER — METOPROLOL TARTRATE 50 MG PO TABS: ORAL_TABLET | ORAL | 5 refills | Status: DC

## 2019-07-09 ENCOUNTER — Ambulatory Visit (INDEPENDENT_AMBULATORY_CARE_PROVIDER_SITE_OTHER): Payer: BC Managed Care – PPO

## 2019-07-09 ENCOUNTER — Other Ambulatory Visit: Payer: Self-pay

## 2019-07-09 ENCOUNTER — Encounter: Payer: Self-pay | Admitting: Internal Medicine

## 2019-07-09 VITALS — BP 146/82 | HR 73 | Temp 98.1°F | Ht 61.0 in | Wt 208.2 lb

## 2019-07-09 DIAGNOSIS — Z23 Encounter for immunization: Secondary | ICD-10-CM

## 2019-07-09 NOTE — Progress Notes (Signed)
Patient came in today for flu shot

## 2019-07-15 ENCOUNTER — Encounter: Payer: Self-pay | Admitting: Internal Medicine

## 2019-07-23 ENCOUNTER — Encounter: Payer: Self-pay | Admitting: Adult Health

## 2019-07-23 ENCOUNTER — Other Ambulatory Visit: Payer: Self-pay

## 2019-07-23 ENCOUNTER — Ambulatory Visit: Payer: BC Managed Care – PPO | Admitting: Adult Health

## 2019-07-23 VITALS — BP 132/73 | HR 76 | Temp 98.0°F | Wt 210.0 lb

## 2019-07-23 DIAGNOSIS — Z9989 Dependence on other enabling machines and devices: Secondary | ICD-10-CM | POA: Diagnosis not present

## 2019-07-23 DIAGNOSIS — G4733 Obstructive sleep apnea (adult) (pediatric): Secondary | ICD-10-CM

## 2019-07-23 DIAGNOSIS — R413 Other amnesia: Secondary | ICD-10-CM | POA: Diagnosis not present

## 2019-07-23 DIAGNOSIS — R569 Unspecified convulsions: Secondary | ICD-10-CM

## 2019-07-23 MED ORDER — VIMPAT 100 MG PO TABS: 100.0000 mg | ORAL_TABLET | Freq: Two times a day (BID) | ORAL | 1 refills | Status: DC

## 2019-07-23 NOTE — Patient Instructions (Signed)
Your Plan:  Continue vimpat Continue CPAP Monitor memory  If your symptoms worsen or you develop new symptoms please let us know.   Thank you for coming to see [place] at [facility]. I hope we have been able to provide you high quality care today.  You may receive a patient satisfaction survey over the next few weeks. We would appreciate your feedback and comments so that we may continue to improve ourselves and the health of our patients.

## 2019-07-23 NOTE — Progress Notes (Signed)
PATIENT: Sara [name] DOB: 01-May-1956  REASON FOR VISIT: follow up HISTORY FROM: patient  HISTORY OF PRESENT ILLNESS: Today 07/23/19:  Sara [name] is a 63 year old female with a history of seizures and obstructive sleep apnea on CPAP.  She returns today for follow-up.  She denies any seizure events.  She continues on [name] 100 mg twice a day.  She reports that she is noticed some mild memory problems unsure if this is related to [name].  She also is on CPAP.  Her download indicates that she uses her machine nightly for compliance of 100%.  She use her machine greater than 4 hours 28 days for compliance of 93%.  On average she uses her machine 7 hours and 21 minutes.  Her residual AHI is 1.7 on 9 cmH2O with EPR of 2.  Her leak in the 95th percentile is 27.4 L/min.  Overall she is doing well.  She returns today for an evaluation.  HISTORY 07/17/18 Sara [name] is a 63 year old female with a history of seizures and obstructive sleep apnea on CPAP.  The patient CPAP download indicates that she use her machine every night for compliance of 100%.  Each night she use her machine greater than 4 hours.  On average she uses her machine 8 hours and 37 minutes.  Her residual AHI is 1.7 on 9 cm of water with EPR of 2.  She reports that she has not had any seizure events.  She continues taking [name] 10 mg in the morning and 150 mg at bedtime.  He operates a Teacher, music without difficulty.  She continues to work full-time but is considering retiring.  He returns today for evaluation.  REVIEW OF SYSTEMS: Out of a complete 14 system review of symptoms, the patient complains only of the following symptoms, and all other reviewed systems are negative.  Memory disturbance  ALLERGIES: Allergies  Allergen Reactions  . Sulfa Antibiotics Itching    HOME MEDICATIONS: Outpatient Medications Prior to Visit  Medication Sig Dispense Refill  . acetaminophen (TYLENOL) 325 MG tablet Take 2 tablets (650 mg  total) by mouth every 6 (six) hours as needed for mild pain. (Patient taking differently: Take 325 mg by mouth at bedtime as needed for mild pain. )    . anagrelide ([name]) 0.5 MG capsule TAKE 1 CAPSULE BY MOUTH 2 TIMES DAILY. 60 capsule 1  . aspirin 325 MG tablet Take 325 mg by mouth daily.    Marland Kitchen atorvastatin (LIPITOR) 40 MG tablet TAKE 1 TABLET BY MOUTH DAILY AT 6 PM 90 tablet 0  . B Complex Vitamins (B COMPLEX PO) Take 1 tablet by mouth daily.     . Blood Glucose Monitoring Suppl (ONETOUCH VERIO IQ SYSTEM) w/Device KIT Use as directed to check blood sugars 1 time per day dx: e11.22 1 kit 1  . Blood Pressure Monitoring (BLOOD PRESSURE CUFF) MISC Check blood pressure 1-2 times a day 3 h after taking BP medication.  Dx- CAD        HTN. 1 each 0  . Cholecalciferol (VITAMIN D3) 2000 UNITS capsule Take 2,000 Units by mouth daily.     Marland Kitchen escitalopram (LEXAPRO) 20 MG tablet Take 20 mg by mouth daily.    Marland Kitchen glucose blood (ONETOUCH VERIO) test strip Use as instructed to check blood sugars 1 time per day dx: e11.22 150 each 2  . hydrochlorothiazide (MICROZIDE) 12.5 MG capsule TAKE 1 CAPSULE(12.5 MG) BY MOUTH DAILY 30 capsule 1  . metoprolol tartrate (LOPRESSOR)  50 MG tablet TAKE 1 AND 1/2 TABLETS BY MOUTH TWICE A DAY 90 tablet 5  . OneTouch Delica Lancets 88F MISC Use as directed to check blood sugars 1 time per day dx: e11.22 150 each 2  . OZEMPIC, 0.25 OR 0.5 MG/DOSE, 2 MG/1.5ML SOPN INJECT 0.'5MG'$  UNDER THE SKIN ON THE SAME DAY OF EACH WEEK IN THE ABDOMEN, THIGH, OR UPPER ARM( MAKE SURE TO ROTATE INJECTION SITES) 4.5 mL 1  . telmisartan (MICARDIS) 80 MG tablet TAKE 1 TABLET BY MOUTH EVERY DAY 90 tablet 1  . [name] 100 MG TABS 1/2 tablet in the morning and 1.5 tablets in the evening (Patient taking differently: 1 tablet in the morning and 1 in evening) 180 tablet 3  . escitalopram (LEXAPRO) 10 MG tablet TK 2 TS PO D     No facility-administered medications prior to visit.     PAST MEDICAL HISTORY:  Past Medical History:  Diagnosis Date  . Diabetes mellitus   . Gout   . Hyperlipemia   . Hypertension   . LVH (left ventricular hypertrophy)    12/21/09 Echo -mild asymmetric LVH,EF =>55%  . Morbid obesity ([place])   . OSA (obstructive sleep apnea)   . PAD (peripheral artery disease) ([place])   . Renal artery stenosis ([place]) 12/13/2010   PTCA and Stent - right  . Seizures ([place])   . Syncopal episodes   . Thrombocytosis ([place])     PAST SURGICAL HISTORY: Past Surgical History:  Procedure Laterality Date  . BREAST CYST EXCISION     S/P Benign Right Breast Cyst Removal.  . BREAST REDUCTION SURGERY  1995   S/P Bilateral breast reduction  . [place]   Times  Two.  . CORONARY ARTERY BYPASS GRAFT N/A 04/21/2017   Procedure: CORONARY ARTERY BYPASS GRAFTING (CABG) x 2, USING LEFT MAMMARY ARTERY AND RIGHT GREATER SAPHENOUS VEIN HARVESTED ENDOSCOPICALLY;  Surgeon: [name], MD;  Location: [facility];  Service: Open Heart Surgery;  Laterality: N/A;  . EXTREMITY CYST EXCISION  1993   left wrist  . LEFT HEART CATH AND CORONARY ANGIOGRAPHY N/A 04/17/2017   Procedure: Left Heart Cath and Coronary Angiography;  Surgeon: Jettie Booze, MD;  Location: [facility];  Service: Cardiovascular;  Laterality: N/A;  . PARTIAL HYSTERECTOMY  1994  . renal artery stent placement Right   . TEE WITHOUT CARDIOVERSION N/A 04/21/2017   Procedure: TRANSESOPHAGEAL ECHOCARDIOGRAM (TEE);  Surgeon: [name], MD;  Location: [facility];  Service: Open Heart Surgery;  Laterality: N/A;    FAMILY HISTORY: Family History  Problem Relation Age of Onset  . Heart failure Mother   . Diabetes Father   . Diabetes Sister   . Hypertension Sister   . Hypertension Sister   . Hypertension Sister   . Breast cancer Maternal Aunt        2 maternal aunts had breast cancer.  . Breast cancer Maternal Grandmother     SOCIAL HISTORY: Social History   Socioeconomic History  . Marital status: Married     Spouse name: Juanda Crumble  . Number of children: 2  . Years of education: 24  . Highest education level: Not on file  Occupational History  . Occupation: TRANSPORTATION COOR.    Employer: [employer]  Social Needs  . Financial resource strain: Not on file  . Food insecurity    Worry: Not on file    Inability: Not on file  . Transportation needs  Medical: Not on file    Non-medical: Not on file  Tobacco Use  . Smoking status: Never Smoker  . Smokeless tobacco: Never Used  Substance and Sexual Activity  . Alcohol use: No    Alcohol/week: 0.0 standard drinks    Frequency: Never  . Drug use: No  . Sexual activity: Yes  Lifestyle  . Physical activity    Days per week: Not on file    Minutes per session: Not on file  . Stress: Not on file  Relationships  . Social Herbalist on phone: Not on file    Gets together: Not on file    Attends religious service: Not on file    Active member of club or organization: Not on file    Attends meetings of clubs or organizations: Not on file    Relationship status: Not on file  . Intimate partner violence    Fear of current or ex partner: Not on file    Emotionally abused: Not on file    Physically abused: Not on file    Forced sexual activity: Not on file  Other Topics Concern  . Not on file  Social History Narrative   Patient lives at husband and her son, home with family.   Patient has two adult children.   Patient is not drinking any caffeine.   Patient is working full-time.   Patient has a college education.   Patient is right-handed.      PHYSICAL EXAM  Vitals:   07/23/19 0743  BP: 132/73  Pulse: 76  Temp: 98 F (36.7 C)  Weight: 210 lb (95.3 kg)   Body mass index is 39.68 kg/m.   MMSE - Mini Mental State Exam 07/23/2019  Orientation to time 5  Orientation to Place 5  Registration 3  Attention/ Calculation 5  Recall 2  Language- name 2 objects 2  Language- repeat 1  Language- follow 3 step  command 3  Language- read & follow direction 1  Write a sentence 1  Copy design 1  Total score 29     Generalized: Well developed, in no acute distress   Neurological examination  Mentation: Alert oriented to time, place, history taking. Follows all commands speech and language fluent Cranial nerve II-XII: Pupils were equal round reactive to light. Extraocular movements were full, visual field were full on confrontational test. Head turning and shoulder shrug  were normal and symmetric. Motor: The motor testing reveals 5 over 5 strength of all 4 extremities. Good symmetric motor tone is noted throughout.  Sensory: Sensory testing is intact to soft touch on all 4 extremities. No evidence of extinction is noted.  Coordination: Cerebellar testing reveals good finger-nose-finger and heel-to-shin bilaterally.  Gait and station: Gait is normal. Reflexes: Deep tendon reflexes are symmetric and normal bilaterally.   DIAGNOSTIC DATA (LABS, IMAGING, TESTING) - I reviewed patient records, labs, notes, testing and imaging myself where available.  Lab Results  Component Value Date   WBC 7.2 07/05/2019   HGB 13.0 07/05/2019   HCT 38.4 07/05/2019   MCV 92.1 07/05/2019   PLT 274 07/05/2019      Component Value Date/Time   NA 137 07/05/2019 1138   NA 142 05/20/2019 1236   NA 139 08/11/2017 1528   K 3.8 07/05/2019 1138   K 4.2 08/11/2017 1528   CL 101 07/05/2019 1138   CL 102 03/19/2013 1535   CO2 31 07/05/2019 1138   CO2 27 08/11/2017 1528  GLUCOSE 100 (H) 07/05/2019 1138   GLUCOSE 93 08/11/2017 1528   GLUCOSE 117 (H) 03/19/2013 1535   BUN 16 07/05/2019 1138   BUN 17 05/20/2019 1236   BUN 15.0 08/11/2017 1528   CREATININE 1.37 (H) 07/05/2019 1138   CREATININE 1.1 08/11/2017 1528   CALCIUM 9.4 07/05/2019 1138   CALCIUM 9.2 08/11/2017 1528   PROT 6.5 07/05/2019 1138   PROT 6.8 11/22/2018 1714   PROT 7.0 08/11/2017 1528   ALBUMIN 3.4 (L) 07/05/2019 1138   ALBUMIN 4.2 11/22/2018  1714   ALBUMIN 3.6 08/11/2017 1528   AST 18 07/05/2019 1138   AST 27 08/11/2017 1528   ALT 18 07/05/2019 1138   ALT 26 08/11/2017 1528   ALKPHOS 75 07/05/2019 1138   ALKPHOS 88 08/11/2017 1528   BILITOT 0.6 07/05/2019 1138   BILITOT 0.4 11/22/2018 1714   BILITOT 0.37 08/11/2017 1528   GFRNONAA 41 (L) 07/05/2019 1138   GFRAA 47 (L) 07/05/2019 1138   Lab Results  Component Value Date   CHOL 99 (L) 11/22/2018   HDL 44 11/22/2018   LDLCALC 47 11/22/2018   TRIG 42 11/22/2018   CHOLHDL 2.3 11/22/2018   Lab Results  Component Value Date   HGBA1C 5.8 (H) 05/20/2019   Lab Results  Component Value Date   UDTHYHOO87 579 03/13/2019   Lab Results  Component Value Date   TSH 2.060 11/22/2018      ASSESSMENT AND PLAN 63 y.o. year old female  has a past medical history of Diabetes mellitus, Gout, Hyperlipemia, Hypertension, LVH (left ventricular hypertrophy), Morbid obesity ([place]), OSA (obstructive sleep apnea), PAD (peripheral artery disease) ([place]), Renal artery stenosis ([place]) (12/13/2010), Seizures ([place]), Syncopal episodes, and Thrombocytosis ([place]). here with:  1.  Seizures 2.  Obstructive sleep apnea on CPAP  The patient will continue on [name] 100 mg twice a day.  The patient's memory score was 29 out of 30.  We will continue to monitor.  She is encouraged to continue using the CPAP nightly and greater than 4 hours each night.  She will follow-up in 6 months or sooner if needed.      Ward Givens, MSN, NP-C 07/23/2019, 8:28 AM [facility] [address] [place], [place] [address] [phone]

## 2019-07-25 ENCOUNTER — Encounter: Payer: Self-pay | Admitting: Internal Medicine

## 2019-07-25 ENCOUNTER — Telehealth: Payer: Self-pay

## 2019-07-25 NOTE — Telephone Encounter (Signed)
PT SENT BLOOD SUGAR READING VIA MYCHART  Mon.-Thur. They ran between 75-92. The test we're done before a meal. I did not take the Ozempic today.. I also checked my blood pressure this week. The blood pressure has been 132/78, 123/71.  Thanks.  Pat Kocher

## 2019-08-01 ENCOUNTER — Encounter: Payer: Self-pay | Admitting: Internal Medicine

## 2019-08-20 ENCOUNTER — Encounter: Payer: Self-pay | Admitting: Internal Medicine

## 2019-08-25 ENCOUNTER — Encounter: Payer: Self-pay | Admitting: Internal Medicine

## 2019-08-29 ENCOUNTER — Encounter: Payer: Self-pay | Admitting: Internal Medicine

## 2019-09-02 ENCOUNTER — Encounter: Payer: Self-pay | Admitting: Nurse Practitioner

## 2019-09-02 ENCOUNTER — Other Ambulatory Visit: Payer: Self-pay

## 2019-09-02 ENCOUNTER — Encounter: Payer: Self-pay | Admitting: Internal Medicine

## 2019-09-02 ENCOUNTER — Ambulatory Visit: Payer: BC Managed Care – PPO | Admitting: Internal Medicine

## 2019-09-02 ENCOUNTER — Ambulatory Visit (INDEPENDENT_AMBULATORY_CARE_PROVIDER_SITE_OTHER): Payer: BC Managed Care – PPO | Admitting: Nurse Practitioner

## 2019-09-02 ENCOUNTER — Telehealth: Payer: Self-pay | Admitting: Adult Health

## 2019-09-02 ENCOUNTER — Encounter: Payer: Self-pay | Admitting: Adult Health

## 2019-09-02 VITALS — BP 126/80 | HR 96 | Temp 98.6°F | Ht 60.8 in | Wt 200.0 lb

## 2019-09-02 DIAGNOSIS — G4089 Other seizures: Secondary | ICD-10-CM | POA: Diagnosis not present

## 2019-09-02 DIAGNOSIS — R7303 Prediabetes: Secondary | ICD-10-CM

## 2019-09-02 LAB — CBC
Hematocrit: 39.5 % (ref 34.0–46.6)
Hemoglobin: 13.7 g/dL (ref 11.1–15.9)
MCH: 30.6 pg (ref 26.6–33.0)
MCHC: 34.7 g/dL (ref 31.5–35.7)
MCV: 88 fL (ref 79–97)
Platelets: 394 10*3/uL (ref 150–450)
RBC: 4.48 x10E6/uL (ref 3.77–5.28)
RDW: 12.5 % (ref 11.7–15.4)
WBC: 7.9 10*3/uL (ref 3.4–10.8)

## 2019-09-02 NOTE — Telephone Encounter (Addendum)
I called pt.  She has had episodes of nocturnal sz? (chewing) around 0200-0300, hard to arouse by husband.  ? Blood sugar.  Has had pcp appt today.  No change in vimpat (is taking 100mg  po bid for 2-3 yrs).  Has lost job due to covid, not resting well, was on trip, taking Ozimbe, then stopped due to BS low.   I made appt for tomorrow at 1415 to evaluate.  Has seen MM/NP in past, but is willis MD.  She is ok to see SS/NP.

## 2019-09-02 NOTE — Telephone Encounter (Signed)
Pt called stating that she wants to discuss her seizure situation with the RN or provider.

## 2019-09-02 NOTE — Progress Notes (Addendum)
PATIENT: Sara [name] DOB: [birth year]  REASON FOR VISIT: follow up HISTORY FROM: patient  HISTORY OF PRESENT ILLNESS: Today 09/03/19  Sara [name] is a 63 year old female with history of seizures and obstructive sleep apnea on CPAP.  The purpose of today's visit is on urgent basis, for concern of nocturnal seizures.  She was taking Ozempic for diabetes but stopped due to low blood sugars at nighttime.  She indicates she has had episodes of possible nocturnal seizures, evidenced by chewing around 2:00 am.  Her husband reports she was difficult to arouse, but she did not check her blood sugar at the time.  She has remained on [name] 100 mg twice a day.  Historically, her typical seizures occurred during the daytime, and were characterized by staring off.  She indicates she has not missed any doses of [name].  She says she was taking Ozempic to help improve her blood sugars, along with weight loss.  She says about 1 week before the episode, she had stopped Ozempic due to low blood sugars.  The first event occurred on 10/31, while sleeping.  She says she was wearing her CPAP, her husband said she was doing a chewing motion with her mouth, and he could not wake her.  She said he put maple syrup in her mask, but not actually in her mouth.  She says, he must not of been that concerned, because he went off to sleep.  She said this particular night, she just returned from a 1 week visit with her daughter, and she had not been resting well.  The second event occurred on 11/5, again was witnessed by her husband, he said she was shaking a little bit.  He again put maple syrup around her mouth, and went off to sleep himself.  She said on both occasions she woke up the next morning with maple syrup around her mouth.  She denies any urinary incontinence with either episode.  She says during this time, she had not been eating past 7:00, and has been eating healthy foods.  She does not feel this represents a seizure,  she feels this was a hypoglycemic episode.  She says her husband is very anxious, and worries.  She does note, he must not have been that concerned, because he did not pursue the situation further and went off to sleep himself.  A1c at her primary office yesterday was 5.8.  She presents today for follow-up unaccompanied.  HISTORY  07/23/2019 MM: Sara [name] is a 63 year old female with a history of seizures and obstructive sleep apnea on CPAP.  She returns today for follow-up.  She denies any seizure events.  She continues on [name] 100 mg twice a day.  She reports that she is noticed some mild memory problems unsure if this is related to [name].  She also is on CPAP.  Her download indicates that she uses her machine nightly for compliance of 100%.  She use her machine greater than 4 hours 28 days for compliance of 93%.  On average she uses her machine 7 hours and 21 minutes.  Her residual AHI is 1.7 on 9 cmH2O with EPR of 2.  Her leak in the 95th percentile is 27.4 L/min.  Overall she is doing well.  She returns today for an evaluation.  REVIEW OF SYSTEMS: Out of a complete 14 system review of symptoms, the patient complains only of the following symptoms, and all other reviewed systems are negative.  Seizures  ALLERGIES: Allergies  Allergen Reactions   Sulfa Antibiotics Itching    HOME MEDICATIONS: Outpatient Medications Prior to Visit  Medication Sig Dispense Refill   acetaminophen (TYLENOL) 325 MG tablet Take 2 tablets (650 mg total) by mouth every 6 (six) hours as needed for mild pain.     anagrelide ([name]) 0.5 MG capsule TAKE 1 CAPSULE BY MOUTH 2 TIMES DAILY. 60 capsule 1   aspirin 325 MG tablet Take 325 mg by mouth daily.     atorvastatin (LIPITOR) 40 MG tablet TAKE 1 TABLET BY MOUTH DAILY AT 6 PM 90 tablet 0   B Complex Vitamins (B COMPLEX PO) Take 1 tablet by mouth daily.      Blood Glucose Monitoring Suppl (ONETOUCH VERIO IQ SYSTEM) w/Device KIT Use as directed to check blood  sugars 1 time per day dx: e11.22 1 kit 1   Blood Pressure Monitoring (BLOOD PRESSURE CUFF) MISC Check blood pressure 1-2 times a day 3 h after taking BP medication.  Dx- CAD        HTN. 1 each 0   Cholecalciferol (VITAMIN D3) 2000 UNITS capsule Take 2,000 Units by mouth daily.      escitalopram (LEXAPRO) 10 MG tablet TK 2 TS PO D     escitalopram (LEXAPRO) 20 MG tablet Take 20 mg by mouth daily.     glucose blood (ONETOUCH VERIO) test strip Use as instructed to check blood sugars 1 time per day dx: e11.22 150 each 2   hydrochlorothiazide (MICROZIDE) 12.5 MG capsule TAKE 1 CAPSULE(12.5 MG) BY MOUTH DAILY 30 capsule 1   metoprolol tartrate (LOPRESSOR) 50 MG tablet TAKE 1 AND 1/2 TABLETS BY MOUTH TWICE A DAY 90 tablet 5   OneTouch Delica Lancets 40C MISC Use as directed to check blood sugars 1 time per day dx: e11.22 150 each 2   telmisartan (MICARDIS) 80 MG tablet TAKE 1 TABLET BY MOUTH EVERY DAY 90 tablet 1   [name] 100 MG TABS Take 1 tablet (100 mg total) by mouth 2 (two) times daily. 180 tablet 1   anagrelide ([name]) 1 MG capsule      OZEMPIC, 0.25 OR 0.5 MG/DOSE, 2 MG/1.5ML SOPN INJECT 0.5MG UNDER THE SKIN ON THE SAME DAY OF EACH WEEK IN THE ABDOMEN, THIGH, OR UPPER ARM( MAKE SURE TO ROTATE INJECTION SITES) (Patient not taking: Reported on 09/03/2019) 4.5 mL 1   No facility-administered medications prior to visit.     PAST MEDICAL HISTORY: Past Medical History:  Diagnosis Date   Diabetes mellitus    Gout    Hyperlipemia    Hypertension    LVH (left ventricular hypertrophy)    12/21/09 Echo -mild asymmetric LVH,EF =>55%   Morbid obesity (HCC)    OSA (obstructive sleep apnea)    PAD (peripheral artery disease) ([place])    Renal artery stenosis ([place]) 12/13/2010   PTCA and Stent - right   Seizures (HCC)    Syncopal episodes    Thrombocytosis ([place])     PAST SURGICAL HISTORY: Past Surgical History:  Procedure Laterality Date   BREAST CYST EXCISION     S/P  Benign Right Breast Cyst Removal.   BREAST REDUCTION SURGERY  1995   S/P Bilateral breast reduction   [place], 1986   Times  Two.   CORONARY ARTERY BYPASS GRAFT N/A 04/21/2017   Procedure: CORONARY ARTERY BYPASS GRAFTING (CABG) x 2, USING LEFT MAMMARY ARTERY AND RIGHT GREATER SAPHENOUS VEIN HARVESTED ENDOSCOPICALLY;  Surgeon: [name], MD;  Location: [facility];  Service: Open Heart Surgery;  Laterality: N/A;   EXTREMITY CYST EXCISION  1993   left wrist   LEFT HEART CATH AND CORONARY ANGIOGRAPHY N/A 04/17/2017   Procedure: Left Heart Cath and Coronary Angiography;  Surgeon: Jettie Booze, MD;  Location: [facility];  Service: Cardiovascular;  Laterality: N/A;   PARTIAL HYSTERECTOMY  1994   renal artery stent placement Right    TEE WITHOUT CARDIOVERSION N/A 04/21/2017   Procedure: TRANSESOPHAGEAL ECHOCARDIOGRAM (TEE);  Surgeon: [name], MD;  Location: [facility];  Service: Open Heart Surgery;  Laterality: N/A;    FAMILY HISTORY: Family History  Problem Relation Age of Onset   Heart failure Mother    Diabetes Father    Diabetes Sister    Hypertension Sister    Hypertension Sister    Hypertension Sister    Breast cancer Maternal Aunt        2 maternal aunts had breast cancer.   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    Employer: [employer]  Social Needs   Financial resource strain: Not on file   Food insecurity    Worry: Not on file    Inability: Not on file   Transportation needs    Medical: Not on file    Non-medical: Not on file  Tobacco Use   Smoking status: Never Smoker   Smokeless tobacco: Never Used  Substance and Sexual Activity   Alcohol use: No     Alcohol/week: 0.0 standard drinks    Frequency: Never   Drug use: No   Sexual activity: Yes  Lifestyle   Physical activity    Days per week: Not on file    Minutes per session: Not on file   Stress: Not on file  Relationships   Social connections    Talks on phone: Not on file    Gets together: Not on file    Attends religious service: Not on file    Active member of club or organization: Not on file    Attends meetings of clubs or organizations: Not on file    Relationship status: Not on file   Intimate partner violence    Fear of current or ex partner: Not on file    Emotionally abused: Not on file    Physically abused: Not on file    Forced sexual activity: Not on file  Other Topics Concern   Not on file  Social History Narrative   Patient lives at husband and her son, home with family.   Patient has two adult children.   Patient is not drinking any caffeine.   Patient is working full-time.   Patient has a college education.   Patient is right-handed.    PHYSICAL EXAM  Vitals:   09/03/19 1403  BP: 135/74  Pulse: 73  Temp: 97.9 F (36.6 C)  Weight: 201 lb 6.4 oz (91.4 kg)  Height: _0  (1.549 m)   Body mass index is 38.05 kg/m.  Generalized: Well developed, in no acute distress   Neurological examination  Mentation: Alert oriented to time, place, history taking. Follows all commands speech and language fluent Cranial nerve II-XII: Pupils were equal round reactive to light. Extraocular  movements were full, visual field were full on confrontational test. Facial sensation and strength were normal. Head turning and shoulder shrug  were normal and symmetric. Motor: The motor testing reveals 5 over 5 strength of all 4 extremities. Good symmetric motor tone is noted throughout.  Sensory: Sensory testing is intact to soft touch on all 4 extremities. No evidence of extinction is noted.  Coordination: Cerebellar testing reveals good finger-nose-finger and  heel-to-shin bilaterally.  Gait and station: Gait is normal. Tandem gait is normal. Romberg is negative. No drift is seen.  Reflexes: Deep tendon reflexes are symmetric and normal bilaterally.   DIAGNOSTIC DATA (LABS, IMAGING, TESTING) - I reviewed patient records, labs, notes, testing and imaging myself where available.  Lab Results  Component Value Date   WBC 7.9 09/02/2019   HGB 13.7 09/02/2019   HCT 39.5 09/02/2019   MCV 88 09/02/2019   PLT 394 09/02/2019      Component Value Date/Time   NA 139 09/02/2019 1140   NA 139 08/11/2017 1528   K 4.3 09/02/2019 1140   K 4.2 08/11/2017 1528   CL 101 09/02/2019 1140   CL 102 03/19/2013 1535   CO2 25 09/02/2019 1140   CO2 27 08/11/2017 1528   GLUCOSE 107 (H) 09/02/2019 1140   GLUCOSE 100 (H) 07/05/2019 1138   GLUCOSE 93 08/11/2017 1528   GLUCOSE 117 (H) 03/19/2013 1535   BUN 16 09/02/2019 1140   BUN 15.0 08/11/2017 1528   CREATININE 1.21 (H) 09/02/2019 1140   CREATININE 1.1 08/11/2017 1528   CALCIUM 9.8 09/02/2019 1140   CALCIUM 9.2 08/11/2017 1528   PROT 6.5 07/05/2019 1138   PROT 6.8 11/22/2018 1714   PROT 7.0 08/11/2017 1528   ALBUMIN 3.4 (L) 07/05/2019 1138   ALBUMIN 4.2 11/22/2018 1714   ALBUMIN 3.6 08/11/2017 1528   AST 18 07/05/2019 1138   AST 27 08/11/2017 1528   ALT 18 07/05/2019 1138   ALT 26 08/11/2017 1528   ALKPHOS 75 07/05/2019 1138   ALKPHOS 88 08/11/2017 1528   BILITOT 0.6 07/05/2019 1138   BILITOT 0.4 11/22/2018 1714   BILITOT 0.37 08/11/2017 1528   GFRNONAA 47 (L) 09/02/2019 1140   GFRAA 54 (L) 09/02/2019 1140   Lab Results  Component Value Date   CHOL 99 (L) 11/22/2018   HDL 44 11/22/2018   LDLCALC 47 11/22/2018   TRIG 42 11/22/2018   CHOLHDL 2.3 11/22/2018   Lab Results  Component Value Date   HGBA1C 5.8 (H) 09/02/2019   Lab Results  Component Value Date   VITAMINB12 924 03/13/2019   Lab Results  Component Value Date   TSH 2.060 11/22/2018    ASSESSMENT AND PLAN 63 y.o. year old  female  has a past medical history of Diabetes mellitus, Gout, Hyperlipemia, Hypertension, LVH (left ventricular hypertrophy), Morbid obesity ([place]), OSA (obstructive sleep apnea), PAD (peripheral artery disease) ([place]), Renal artery stenosis ([place]) (12/13/2010), Seizures ([place]), Syncopal episodes, and Thrombocytosis ([place]). here with:  1. History of seizures, recent occurrence of seizure or hypoglycemia?  It sounds likely that the episodes that she is describing represents hypoglycemia, however she had stopped Ozempic about 1 week before (1 week half-life).  She indicates that she has not been eating past 7:00 pm, and has been eating healthy.  Unfortunately, she did not check her blood sugar at bedtime or during the middle of the night when these episodes occurred.  Her typical seizure has been characterized as staring off, and occurring during the day.  She has not missed any doses of [name].  Today, I will check a [name] level.  I have encouraged her to eat a snack before bed to ensure her blood sugars are not dropping too low during sleep and to check her sugars before bed, and in the morning.  Once a [name] level results, we will evaluate if her [name] needs to be increased.  She should follow-up in 6 months or sooner if needed for her routine appointment.  Her recent A1c yesterday was 5.8.  She feels that the recent events represent hypoglycemia versus seizure.   Addendum 09/05/2019: Sara [name] sent a My Chart Message providing her blood sugar checks, from 11/2-11/7 range of AM sugar 81-110, before bed range from 88-116 (she had begun eating a snack before bed)  I spent 25 minutes with the patient. 50% of this time was spent discussing her plan of care.  Butler Denmark, AGNP-C, DNP 09/03/2019, 4:36 PM [facility] [address], [place], [place] [address] [phone]

## 2019-09-02 NOTE — Progress Notes (Addendum)
Subjective:     Patient ID: Sara [name] , female    DOB: [birth year] , 63 y.o.   MRN: [id number]   Chief Complaint  Patient presents with  . Diabetes    HPI  She had been having low blood sugars in the middle of the night.    Stops eating at 7pm.  She reports her blood sugar has dropped down to 50's once 3 weeks.  She tells  She has stopped taking the Ozempic since the last part of September. Then in the last week she has been smacking in the middle of the night.  Last Saturday came back in town.  She has also been changing her diet eating more salads.  She has been checking her blood sugars more that makes her more aware.  She has lost 7 lbs.  She is doing 30 minutes per day.    Wt Readings from Last 3 Encounters: 09/02/19 : 200 lb (90.7 kg) 07/23/19 : 210 lb (95.3 kg) 07/09/19 : 208 lb 3.2 oz (94.4 kg)   Diabetes She presents for her follow-up diabetic visit. Diabetes type: prediabetes. There are no hypoglycemic associated symptoms. There are no diabetic associated symptoms. Pertinent negatives for diabetes include no blurred vision. There are no hypoglycemic complications. There are no diabetic complications. Risk factors for coronary artery disease include sedentary lifestyle and obesity. Current diabetic treatment includes oral agent (monotherapy) (ozempic).     Past Medical History:  Diagnosis Date  . Diabetes mellitus   . Gout   . Hyperlipemia   . Hypertension   . LVH (left ventricular hypertrophy)    12/21/09 Echo -mild asymmetric LVH,EF =>55%  . Morbid obesity ([place])   . OSA (obstructive sleep apnea)   . PAD (peripheral artery disease) ([place])   . Renal artery stenosis ([place]) 12/13/2010   PTCA and Stent - right  . Seizures ([employer])   . Syncopal episodes   . Thrombocytosis (HCC)      Family History  Problem Relation Age of Onset  . Heart failure Mother   . Diabetes Father   . Diabetes Sister   . Hypertension Sister   . Hypertension Sister   . Hypertension  Sister   . Breast cancer Maternal Aunt        2 maternal aunts had breast cancer.  . Breast cancer Maternal Grandmother      Current Outpatient Medications:  .  anagrelide ([name]) 0.5 MG capsule, TAKE 1 CAPSULE BY MOUTH 2 TIMES DAILY., Disp: 60 capsule, Rfl: 1 .  aspirin 325 MG tablet, Take 325 mg by mouth daily., Disp: , Rfl:  .  atorvastatin (LIPITOR) 40 MG tablet, TAKE 1 TABLET BY MOUTH DAILY AT 6 PM, Disp: 90 tablet, Rfl: 0 .  B Complex Vitamins (B COMPLEX PO), Take 1 tablet by mouth daily. , Disp: , Rfl:  .  Blood Glucose Monitoring Suppl (ONETOUCH VERIO IQ SYSTEM) w/Device KIT, Use as directed to check blood sugars 1 time per day dx: e11.22, Disp: 1 kit, Rfl: 1 .  Blood Pressure Monitoring (BLOOD PRESSURE CUFF) MISC, Check blood pressure 1-2 times a day 3 h after taking BP medication.  Dx- CAD        HTN., Disp: 1 each, Rfl: 0 .  Cholecalciferol (VITAMIN D3) 2000 UNITS capsule, Take 2,000 Units by mouth daily. , Disp: , Rfl:  .  escitalopram (LEXAPRO) 10 MG tablet, TK 2 TS PO D, Disp: , Rfl:  .  glucose blood (ONETOUCH VERIO) test strip, Use  as instructed to check blood sugars 1 time per day dx: e11.22, Disp: 150 each, Rfl: 2 .  hydrochlorothiazide (MICROZIDE) 12.5 MG capsule, TAKE 1 CAPSULE(12.5 MG) BY MOUTH DAILY, Disp: 30 capsule, Rfl: 1 .  metoprolol tartrate (LOPRESSOR) 50 MG tablet, TAKE 1 AND 1/2 TABLETS BY MOUTH TWICE A DAY, Disp: 90 tablet, Rfl: 5 .  OneTouch Delica Lancets 73Z MISC, Use as directed to check blood sugars 1 time per day dx: e11.22, Disp: 150 each, Rfl: 2 .  telmisartan (MICARDIS) 80 MG tablet, TAKE 1 TABLET BY MOUTH EVERY DAY, Disp: 90 tablet, Rfl: 1 .  [name] 100 MG TABS, Take 1 tablet (100 mg total) by mouth 2 (two) times daily., Disp: 180 tablet, Rfl: 1 .  acetaminophen (TYLENOL) 325 MG tablet, Take 2 tablets (650 mg total) by mouth every 6 (six) hours as needed for mild pain. (Patient not taking: Reported on 09/02/2019), Disp: , Rfl:  .  escitalopram  (LEXAPRO) 20 MG tablet, Take 20 mg by mouth daily., Disp: , Rfl:  .  OZEMPIC, 0.25 OR 0.5 MG/DOSE, 2 MG/1.5ML SOPN, INJECT 0.5MG UNDER THE SKIN ON THE SAME DAY OF EACH WEEK IN THE ABDOMEN, THIGH, OR UPPER ARM( MAKE SURE TO ROTATE INJECTION SITES) (Patient not taking: Reported on 09/02/2019), Disp: 4.5 mL, Rfl: 1   Allergies  Allergen Reactions  . Sulfa Antibiotics Itching     Review of Systems  Constitutional: Negative.   Eyes: Negative for blurred vision.  Respiratory: Negative.   Neurological:       In her sleep she is having "smacking of her lips" episodes  Psychiatric/Behavioral: Negative.      Today's Vitals   09/02/19 1035  BP: 126/80  Pulse: 96  Temp: 98.6 F (37 C)  TempSrc: Oral  Weight: 200 lb (90.7 kg)  Height: 5' 0.8" (1.544 m)  PainSc: 0-No pain   Body mass index is 38.04 kg/m.   Objective:  Physical Exam Vitals signs reviewed.  Constitutional:      General: She is not in acute distress.    Appearance: Normal appearance. She is obese.  Cardiovascular:     Rate and Rhythm: Normal rate and regular rhythm.     Pulses: Normal pulses.     Heart sounds: Normal heart sounds. No murmur.  Pulmonary:     Effort: Pulmonary effort is normal. No respiratory distress.     Breath sounds: Normal breath sounds.  Skin:    General: Skin is warm and dry.     Capillary Refill: Capillary refill takes less than 2 seconds.  Neurological:     General: No focal deficit present.     Mental Status: She is alert and oriented to person, place, and time.     Cranial Nerves: No cranial nerve deficit.  Psychiatric:        Mood and Affect: Mood normal.        Behavior: Behavior normal.        Thought Content: Thought content normal.        Judgment: Judgment normal.         Assessment And Plan:     1. Prediabetes  She is no longer taking the Ozempic   Will recheck levels today  Advised if possible when she has the "lip smacking episodes" for her husband to check her  blood sgar - Hemoglobin A1c - CBC without diff  2. Other seizures ([place])  She is on vimpat and being followed by neurology  I am concerned her  episodes of "lip smacking" may be seizure activity I have sent a message to Neurology to inform of her symptoms  I have also advised to patient to call to Neurology as they may want to see her in the office. - BMP8+eGFR - CBC without diff   [name], FNP    THE PATIENT IS ENCOURAGED TO PRACTICE SOCIAL DISTANCING DUE TO THE COVID-19 PANDEMIC.

## 2019-09-03 ENCOUNTER — Encounter: Payer: Self-pay | Admitting: Adult Health

## 2019-09-03 ENCOUNTER — Encounter: Payer: Self-pay | Admitting: Neurology

## 2019-09-03 ENCOUNTER — Ambulatory Visit (INDEPENDENT_AMBULATORY_CARE_PROVIDER_SITE_OTHER): Payer: BC Managed Care – PPO | Admitting: Neurology

## 2019-09-03 VITALS — BP 135/74 | HR 73 | Temp 97.9°F | Ht 61.0 in | Wt 201.4 lb

## 2019-09-03 DIAGNOSIS — E119 Type 2 diabetes mellitus without complications: Secondary | ICD-10-CM

## 2019-09-03 DIAGNOSIS — G4089 Other seizures: Secondary | ICD-10-CM | POA: Diagnosis not present

## 2019-09-03 LAB — BMP8+EGFR
BUN/Creatinine Ratio: 13 (ref 12–28)
BUN: 16 mg/dL (ref 8–27)
CO2: 25 mmol/L (ref 20–29)
Calcium: 9.8 mg/dL (ref 8.7–10.3)
Chloride: 101 mmol/L (ref 96–106)
Creatinine, Ser: 1.21 mg/dL — ABNORMAL HIGH (ref 0.57–1.00)
GFR calc Af Amer: 54 mL/min/{1.73_m2} — ABNORMAL LOW (ref >59)
GFR calc non Af Amer: 47 mL/min/{1.73_m2} — ABNORMAL LOW (ref >59)
Glucose: 107 mg/dL — ABNORMAL HIGH (ref 65–99)
Potassium: 4.3 mmol/L (ref 3.5–5.2)
Sodium: 139 mmol/L (ref 134–144)

## 2019-09-03 LAB — HEMOGLOBIN A1C
Est. average glucose Bld gHb Est-mCnc: 120 mg/dL
Hgb A1c MFr Bld: 5.8 % — ABNORMAL HIGH (ref 4.8–5.6)

## 2019-09-03 NOTE — Patient Instructions (Addendum)
I will check a [name] level today. I will call you tomorrow with results and if we need to increase your [name]. Please start eating a snack before bed.

## 2019-09-03 NOTE — Progress Notes (Signed)
I have read the note, and I agree with the clinical assessment and plan.  Sara [name]   

## 2019-09-04 ENCOUNTER — Other Ambulatory Visit: Payer: Self-pay

## 2019-09-04 ENCOUNTER — Other Ambulatory Visit: Payer: Self-pay | Admitting: Hematology

## 2019-09-04 DIAGNOSIS — D473 Essential (hemorrhagic) thrombocythemia: Secondary | ICD-10-CM

## 2019-09-04 MED ORDER — HYDROCHLOROTHIAZIDE 12.5 MG PO CAPS: ORAL_CAPSULE | ORAL | 2 refills | Status: DC

## 2019-09-06 ENCOUNTER — Encounter: Payer: Self-pay | Admitting: Hematology

## 2019-09-06 ENCOUNTER — Encounter: Payer: Self-pay | Admitting: Adult Health

## 2019-09-06 DIAGNOSIS — G4730 Sleep apnea, unspecified: Secondary | ICD-10-CM

## 2019-09-06 DIAGNOSIS — R55 Syncope and collapse: Secondary | ICD-10-CM

## 2019-09-06 LAB — LACOSAMIDE: Lacosamide: 8.5 ug/mL (ref 5.0–10.0)

## 2019-09-06 NOTE — Telephone Encounter (Signed)
Spoke with pt who report she has 2 episodes over the last week. Pt is agreeable to monitor

## 2019-09-08 ENCOUNTER — Encounter: Payer: Self-pay | Admitting: Nurse Practitioner

## 2019-09-08 NOTE — Telephone Encounter (Signed)
2 week event monitor please

## 2019-09-09 NOTE — Telephone Encounter (Signed)
Looks like the lab values are back.  Can you address since Dr. Jannifer Franklin and Judson Roch are off today.  Thanks

## 2019-09-09 NOTE — Addendum Note (Signed)
Addended by: Meryl Crutch on: 09/09/2019 08:43 AM   Modules accepted: Orders

## 2019-09-11 ENCOUNTER — Telehealth: Payer: Self-pay | Admitting: Neurology

## 2019-09-11 NOTE — Telephone Encounter (Signed)
I called the patient. Her [name] level was normal. She has not had further episodes. She does not think it was a seizure. She thinks her husband overreacted. She says she has been feeling very well. She adjusted her CPAP mask, is going to wear a heart monitor for a few weeks. She is to contact me if any further events occur. I did offer an increase in [name], does not want to make any changes at this time.

## 2019-09-24 ENCOUNTER — Encounter: Payer: Self-pay | Admitting: Cardiovascular Disease

## 2019-09-24 ENCOUNTER — Ambulatory Visit (INDEPENDENT_AMBULATORY_CARE_PROVIDER_SITE_OTHER): Payer: BC Managed Care – PPO

## 2019-09-24 DIAGNOSIS — R55 Syncope and collapse: Secondary | ICD-10-CM | POA: Diagnosis not present

## 2019-10-02 NOTE — Progress Notes (Signed)
[facility]   Telephone:([phone]) [phone] Fax:(336) [phone]   Clinic Follow up Note   Patient Care Team: [name], MD as PCP - General (Internal Medicine)  Date of Service:  10/04/2019  CHIEF COMPLAINT: follow up Essential Thrombocytosis  CURRENT THERAPY:  -Hydroxyurea 500 mg daily, started in 2010., increased to hydrea '1000mg'$  daily on Monday and Thursday, and '500mg'$  daily for the rest of week in 01/2015, Aspirin 81 mg daily.  -Switched Hydrea to anagrelide 1 mg daily 07/2016. Held and was on hydrea from 01/2019-02/2019 due to Anagrelide on back order.   INTERVAL HISTORY:  Sara [name] is here for a follow up of ET. She was last seen by me 6 months ago. She presents to the clinic alone. She notes she is doing well. She notes having another syncopal episode and her husband could not wake her. She has a heart monitor on now. She denies any dizziness or palpitations now.  She notes she is tolerating Anagrelide well although she likes hydrea better.    REVIEW OF SYSTEMS:   Constitutional: Denies fevers, chills or abnormal weight loss Eyes: Denies blurriness of vision Ears, nose, mouth, throat, and face: Denies mucositis or sore throat Respiratory: Denies cough, dyspnea or wheezes Cardiovascular: Denies palpitation, chest discomfort or lower extremity swelling Gastrointestinal:  Denies nausea, heartburn or change in bowel habits Skin: Denies abnormal skin rashes Lymphatics: Denies new lymphadenopathy or easy bruising Neurological:Denies numbness, tingling or new weaknesses Behavioral/Psych: Mood is stable, no new changes  All other systems were reviewed with the patient and are negative.  MEDICAL HISTORY:  Past Medical History:  Diagnosis Date  . Diabetes mellitus   . Gout   . Hyperlipemia   . Hypertension   . LVH (left ventricular hypertrophy)    12/21/09 Echo -mild asymmetric LVH,EF =>55%  . Morbid obesity ([place])   . OSA (obstructive sleep apnea)   . PAD  (peripheral artery disease) ([place])   . Renal artery stenosis ([place]) 12/13/2010   PTCA and Stent - right  . Seizures ([place])   . Syncopal episodes   . Thrombocytosis ([place])     SURGICAL HISTORY: Past Surgical History:  Procedure Laterality Date  . BREAST CYST EXCISION     S/P Benign Right Breast Cyst Removal.  . BREAST REDUCTION SURGERY  1995   S/P Bilateral breast reduction  . [place]   Times  Two.  . CORONARY ARTERY BYPASS GRAFT N/A 04/21/2017   Procedure: CORONARY ARTERY BYPASS GRAFTING (CABG) x 2, USING LEFT MAMMARY ARTERY AND RIGHT GREATER SAPHENOUS VEIN HARVESTED ENDOSCOPICALLY;  Surgeon: [name], MD;  Location: [facility];  Service: Open Heart Surgery;  Laterality: N/A;  . EXTREMITY CYST EXCISION  1993   left wrist  . LEFT HEART CATH AND CORONARY ANGIOGRAPHY N/A 04/17/2017   Procedure: Left Heart Cath and Coronary Angiography;  Surgeon: Jettie Booze, MD;  Location: [facility];  Service: Cardiovascular;  Laterality: N/A;  . PARTIAL HYSTERECTOMY  1994  . renal artery stent placement Right   . TEE WITHOUT CARDIOVERSION N/A 04/21/2017   Procedure: TRANSESOPHAGEAL ECHOCARDIOGRAM (TEE);  Surgeon: [name], MD;  Location: [facility];  Service: Open Heart Surgery;  Laterality: N/A;    I have reviewed the social history and family history with the patient and they are unchanged from previous note.  ALLERGIES:  is allergic to sulfa antibiotics.  MEDICATIONS:  Current Outpatient Medications  Medication Sig Dispense Refill  . acetaminophen (TYLENOL)  325 MG tablet Take 2 tablets (650 mg total) by mouth every 6 (six) hours as needed for mild pain.    Marland Kitchen anagrelide ([name]) 1 MG capsule TAKE 1 CAPSULE(1 MG) BY MOUTH DAILY 30 capsule 5  . aspirin 325 MG tablet Take 325 mg by mouth daily.    Marland Kitchen atorvastatin (LIPITOR) 40 MG tablet TAKE 1 TABLET BY MOUTH DAILY AT 6 PM 90 tablet 0  . B Complex Vitamins (B COMPLEX PO) Take 1 tablet by mouth daily.     . Blood  Glucose Monitoring Suppl (ONETOUCH VERIO IQ SYSTEM) w/Device KIT Use as directed to check blood sugars 1 time per day dx: e11.22 1 kit 1  . Blood Pressure Monitoring (BLOOD PRESSURE CUFF) MISC Check blood pressure 1-2 times a day 3 h after taking BP medication.  Dx- CAD        HTN. 1 each 0  . Cholecalciferol (VITAMIN D3) 2000 UNITS capsule Take 2,000 Units by mouth daily.     Marland Kitchen escitalopram (LEXAPRO) 10 MG tablet TK 2 TS PO D    . escitalopram (LEXAPRO) 20 MG tablet Take 20 mg by mouth daily.    Marland Kitchen glucose blood (ONETOUCH VERIO) test strip Use as instructed to check blood sugars 1 time per day dx: e11.22 150 each 2  . hydrochlorothiazide (MICROZIDE) 12.5 MG capsule TAKE 1 CAPSULE(12.5 MG) BY MOUTH DAILY 30 capsule 2  . metoprolol tartrate (LOPRESSOR) 50 MG tablet TAKE 1 AND 1/2 TABLETS BY MOUTH TWICE A DAY 90 tablet 5  . OneTouch Delica Lancets 54G MISC Use as directed to check blood sugars 1 time per day dx: e11.22 150 each 2  . telmisartan (MICARDIS) 80 MG tablet TAKE 1 TABLET BY MOUTH EVERY DAY 90 tablet 1  . [name] 100 MG TABS Take 1 tablet (100 mg total) by mouth 2 (two) times daily. 180 tablet 1   No current facility-administered medications for this visit.    PHYSICAL EXAMINATION: ECOG PERFORMANCE STATUS: 0 - Asymptomatic  Vitals:   10/04/19 1015  BP: (!) 156/69  Pulse: 70  Resp: 18  Temp: 97.9 F (36.6 C)  SpO2: 99%   Filed Weights   10/04/19 1015  Weight: 201 lb 6.4 oz (91.4 kg)    GENERAL:alert, no distress and comfortable SKIN: skin color, texture, turgor are normal, no rashes or significant lesions EYES: normal, Conjunctiva are pink and non-injected, sclera clear  NECK: supple, thyroid normal size, non-tender, without nodularity LYMPH:  no palpable lymphadenopathy in the cervical, axillary  LUNGS: clear to auscultation and percussion with normal breathing effort HEART: regular rate & rhythm and no murmurs and no lower extremity edema ABDOMEN:abdomen soft,  non-tender and normal bowel sounds Musculoskeletal:no cyanosis of digits and no clubbing  NEURO: alert & oriented x 3 with fluent speech, no focal motor/sensory deficits  LABORATORY DATA:  I have reviewed the data as listed CBC Latest Ref Rng & Units 10/04/2019 09/02/2019 07/05/2019  WBC 4.0 - 10.5 K/uL 7.8 7.9 7.2  Hemoglobin 12.0 - 15.0 g/dL 13.3 13.7 13.0  Hematocrit 36.0 - 46.0 % 41.6 39.5 38.4  Platelets 150 - 400 K/uL 460(H) 394 274     CMP Latest Ref Rng & Units 09/02/2019 07/05/2019 05/20/2019  Glucose 65 - 99 mg/dL 107(H) 100(H) 96  BUN 8 - 27 mg/dL '16 16 17  '$ Creatinine 0.57 - 1.00 mg/dL 1.21(H) 1.37(H) 1.35(H)  Sodium 134 - 144 mmol/L 139 137 142  Potassium 3.5 - 5.2 mmol/L 4.3 3.8 4.2  Chloride  96 - 106 mmol/L 101 101 101  CO2 20 - 29 mmol/L '25 31 25  '$ Calcium 8.7 - 10.3 mg/dL 9.8 9.4 9.9  Total Protein 6.5 - 8.1 g/dL - 6.5 -  Total Bilirubin 0.3 - 1.2 mg/dL - 0.6 -  Alkaline Phos 38 - 126 U/L - 75 -  AST 15 - 41 U/L - 18 -  ALT 0 - 44 U/L - 18 -      RADIOGRAPHIC STUDIES: I have personally reviewed the radiological images as listed and agreed with the findings in the report. No results found.   ASSESSMENT & PLAN:  Sara [name] is a 63 y.o. female with   1. Essential Thrombocythemia. JAK2 (+)  -Diagnosed in 2010. JAK2 mutation was present. Her bone marrow aspirate and biopsy on 01/20/2009 which yielded a specimen that was suboptimal for evaluation. However, megakaryocytes were abundant with normal morphology. There was no clustering or excess blasts seen. Platelet count on 01/20/2009 was 881.  -She was on Hydrea previously, had episodes of dizziness and syncope, and she was concerned about the risk of leukemia, so it was stopped. The patient is being treated with aspirin and Anagrelide '1mg'$  daily now.  -She continues to tolerate Anagrelide well. Will continue to watch her heart function. I reviewed the slightly increased risk of heart disease prom anagrelide -Cbc  reviewed and WNL except PLT 460K, slightly elevated. Will continue Anagrelide '1mg'$  daily.  -Will repeat lab in 6 weeks and I will see her in 3 months   2. H/o of Seizure, Recurrent Syncope episodes -Her Syncope have unclear etiology. -She has a heart monitor on now for evaluation and latest report showed sinus tachy SVT was faster than normal for 24 seconds on 10/02/19.  -I discussed this syncope can still be related to her Seizure.  -continue meds, follow up with her neurologist.  4. Diabetes, HTN -She has been seeing a nutritionist at church. -She monitors her sugar level regularly -continue meds and f/uwith PCP  5. Coronary artery disease -Coronary artery disease status post bypass surgery grafting x 2 by Dr. Cyndia Bent in June 2018. -She completed cardiac rehab   PLAN: -She is clinically stable. Lab reviewed, PLT slightly above normal  -Continue anagrelide 1 mg daily, I refilled today  -lab in 6 weeks  -Lab and f/u in 3 months    No problem-specific Assessment & Plan notes found for this encounter.   No orders of the defined types were placed in this encounter.  All questions were answered. The patient knows to call the clinic with any problems, questions or concerns. No barriers to learning was detected. I spent 15 minutes counseling the patient face to face. The total time spent in the appointment was 20 minutes and more than 50% was on counseling and review of test results     Truitt Merle, MD 10/04/2019   I, Joslyn Devon, am acting as scribe for Truitt Merle, MD.   I have reviewed the above documentation for accuracy and completeness, and I agree with the above.

## 2019-10-03 ENCOUNTER — Telehealth: Payer: Self-pay | Admitting: *Deleted

## 2019-10-03 NOTE — Telephone Encounter (Signed)
Discussed with Dr Sallyanne Kuster no changes continue to monitor ./cy    Spoke with pt and pt's husband Per pt was sleeping and appears to have been dreaming during this episode Husband had to awaken pt./cy         [employer] sent a serious event via fax  From 10/02/19 at 5:33 am CST  Sinus tachycardia SVT (24 secs) /cy

## 2019-10-04 ENCOUNTER — Other Ambulatory Visit: Payer: Self-pay | Admitting: Cardiovascular Disease

## 2019-10-04 ENCOUNTER — Telehealth: Payer: Self-pay | Admitting: Hematology

## 2019-10-04 ENCOUNTER — Other Ambulatory Visit: Payer: Self-pay

## 2019-10-04 ENCOUNTER — Inpatient Hospital Stay: Payer: BC Managed Care – PPO | Attending: Hematology

## 2019-10-04 ENCOUNTER — Inpatient Hospital Stay (HOSPITAL_BASED_OUTPATIENT_CLINIC_OR_DEPARTMENT_OTHER): Payer: BC Managed Care – PPO | Admitting: Hematology

## 2019-10-04 ENCOUNTER — Encounter: Payer: Self-pay | Admitting: Hematology

## 2019-10-04 ENCOUNTER — Encounter: Payer: Self-pay | Admitting: Internal Medicine

## 2019-10-04 VITALS — BP 156/69 | HR 70 | Temp 97.9°F | Resp 18 | Wt 201.4 lb

## 2019-10-04 DIAGNOSIS — Z79899 Other long term (current) drug therapy: Secondary | ICD-10-CM | POA: Insufficient documentation

## 2019-10-04 DIAGNOSIS — R55 Syncope and collapse: Secondary | ICD-10-CM | POA: Diagnosis not present

## 2019-10-04 DIAGNOSIS — I2510 Atherosclerotic heart disease of native coronary artery without angina pectoris: Secondary | ICD-10-CM | POA: Diagnosis not present

## 2019-10-04 DIAGNOSIS — E785 Hyperlipidemia, unspecified: Secondary | ICD-10-CM | POA: Diagnosis not present

## 2019-10-04 DIAGNOSIS — I10 Essential (primary) hypertension: Secondary | ICD-10-CM

## 2019-10-04 DIAGNOSIS — D473 Essential (hemorrhagic) thrombocythemia: Secondary | ICD-10-CM | POA: Insufficient documentation

## 2019-10-04 DIAGNOSIS — Z7982 Long term (current) use of aspirin: Secondary | ICD-10-CM | POA: Diagnosis not present

## 2019-10-04 DIAGNOSIS — I119 Hypertensive heart disease without heart failure: Secondary | ICD-10-CM | POA: Insufficient documentation

## 2019-10-04 DIAGNOSIS — Z0000 Encounter for general adult medical examination without abnormal findings: Secondary | ICD-10-CM

## 2019-10-04 DIAGNOSIS — Z951 Presence of aortocoronary bypass graft: Secondary | ICD-10-CM | POA: Insufficient documentation

## 2019-10-04 DIAGNOSIS — E1151 Type 2 diabetes mellitus with diabetic peripheral angiopathy without gangrene: Secondary | ICD-10-CM | POA: Diagnosis not present

## 2019-10-04 DIAGNOSIS — E119 Type 2 diabetes mellitus without complications: Secondary | ICD-10-CM | POA: Diagnosis not present

## 2019-10-04 LAB — CBC WITH DIFFERENTIAL/PLATELET
Abs Immature Granulocytes: 0.04 10*3/uL (ref 0.00–0.07)
Basophils Absolute: 0.1 10*3/uL (ref 0.0–0.1)
Basophils Relative: 1 %
Eosinophils Absolute: 0.3 10*3/uL (ref 0.0–0.5)
Eosinophils Relative: 4 %
HCT: 41.6 % (ref 36.0–46.0)
Hemoglobin: 13.3 g/dL (ref 12.0–15.0)
Immature Granulocytes: 1 %
Lymphocytes Relative: 18 %
Lymphs Abs: 1.4 10*3/uL (ref 0.7–4.0)
MCH: 29.9 pg (ref 26.0–34.0)
MCHC: 32 g/dL (ref 30.0–36.0)
MCV: 93.5 fL (ref 80.0–100.0)
Monocytes Absolute: 0.5 10*3/uL (ref 0.1–1.0)
Monocytes Relative: 6 %
Neutro Abs: 5.4 10*3/uL (ref 1.7–7.7)
Neutrophils Relative %: 70 %
Platelets: 460 10*3/uL — ABNORMAL HIGH (ref 150–400)
RBC: 4.45 MIL/uL (ref 3.87–5.11)
RDW: 13.2 % (ref 11.5–15.5)
WBC: 7.8 10*3/uL (ref 4.0–10.5)
nRBC: 0 % (ref 0.0–0.2)

## 2019-10-04 NOTE — Telephone Encounter (Signed)
The patient has been advised that there are no changes at this time. She is asymptomatic. We will continue to monitor.

## 2019-10-04 NOTE — Telephone Encounter (Signed)
Scheduled appt per 12/11 los - pt is aware of appts - gave patient AVS and calender

## 2019-10-10 ENCOUNTER — Telehealth: Payer: Self-pay | Admitting: Cardiovascular Disease

## 2019-10-10 NOTE — Telephone Encounter (Signed)
New Message  Patient is calling in to speak with Dr. Lurline Del nurse about the heart monitor that she is wearing. Patient states that she has questions about it and the results. Please give patient a call back.

## 2019-10-10 NOTE — Telephone Encounter (Signed)
Pt was wanting to know if we have received anything from [employer] about her monitor because her husband said she had an episode of SVT last night. Asked pt if she was having an symptoms, denied. Advised pt that if he receive anything we will call her. Verbalized understanding.

## 2019-10-14 ENCOUNTER — Encounter: Payer: Self-pay | Admitting: Hematology

## 2019-10-28 ENCOUNTER — Other Ambulatory Visit: Payer: Self-pay

## 2019-10-28 ENCOUNTER — Ambulatory Visit (INDEPENDENT_AMBULATORY_CARE_PROVIDER_SITE_OTHER): Payer: Medicare Other | Admitting: Internal Medicine

## 2019-10-28 ENCOUNTER — Encounter: Payer: Self-pay | Admitting: Internal Medicine

## 2019-10-28 ENCOUNTER — Ambulatory Visit: Payer: BC Managed Care – PPO | Admitting: Internal Medicine

## 2019-10-28 VITALS — BP 136/94 | HR 63 | Temp 98.3°F | Ht 61.2 in | Wt 198.2 lb

## 2019-10-28 DIAGNOSIS — E1122 Type 2 diabetes mellitus with diabetic chronic kidney disease: Secondary | ICD-10-CM | POA: Diagnosis not present

## 2019-10-28 DIAGNOSIS — D473 Essential (hemorrhagic) thrombocythemia: Secondary | ICD-10-CM | POA: Diagnosis not present

## 2019-10-28 DIAGNOSIS — E7800 Pure hypercholesterolemia, unspecified: Secondary | ICD-10-CM

## 2019-10-28 DIAGNOSIS — N182 Chronic kidney disease, stage 2 (mild): Secondary | ICD-10-CM

## 2019-10-28 DIAGNOSIS — Z6837 Body mass index (BMI) 37.0-37.9, adult: Secondary | ICD-10-CM

## 2019-10-28 DIAGNOSIS — I1310 Hypertensive heart and chronic kidney disease without heart failure, with stage 1 through stage 4 chronic kidney disease, or unspecified chronic kidney disease: Secondary | ICD-10-CM | POA: Diagnosis not present

## 2019-10-28 DIAGNOSIS — I471 Supraventricular tachycardia: Secondary | ICD-10-CM

## 2019-10-28 NOTE — Progress Notes (Signed)
This visit occurred during the SARS-CoV-2 public health emergency.  Safety protocols were in place, including screening questions prior to the visit, additional usage of staff PPE, and extensive cleaning of exam room while observing appropriate contact time as indicated for disinfecting solutions.  Subjective:     Patient ID: Sara [name] , female    DOB: 12/26/1955 , 63 y.o.   MRN: [id number]   Chief Complaint  Patient presents with  . Diabetes  . Hyperlipidemia    HPI  She is here today for DM and chol check. She states she is exercising 2-3 times per week. She reports compliance with atorvastatin.  She denies having any side effects.   Diabetes She presents for her follow-up diabetic visit. She has type 2 diabetes mellitus. There are no hypoglycemic associated symptoms. There are no diabetic associated symptoms. Diabetic complications include nephropathy. Risk factors for coronary artery disease include diabetes mellitus, dyslipidemia, hypertension, obesity, post-menopausal and sedentary lifestyle. She is following a diabetic diet. She participates in exercise three times a week. Her breakfast blood glucose is taken between 7-8 am. Her breakfast blood glucose range is generally 90-110 mg/dl. An ACE inhibitor/angiotensin II receptor blocker is being taken. Eye exam is current.     Past Medical History:  Diagnosis Date  . Diabetes mellitus   . Gout   . Hyperlipemia   . Hypertension   . LVH (left ventricular hypertrophy)    12/21/09 Echo -mild asymmetric LVH,EF =>55%  . Morbid obesity ([facility])   . OSA (obstructive sleep apnea)   . PAD (peripheral artery disease) ([facility])   . Renal artery stenosis ([facility]) 12/13/2010   PTCA and Stent - right  . Seizures ([facility])   . Syncopal episodes   . Thrombocytosis ([facility])      Family History  Problem Relation Age of Onset  . Heart failure Mother   . Diabetes Father   . Diabetes Sister   . Hypertension Sister   . Hypertension Sister   .  Hypertension Sister   . Breast cancer Maternal Aunt        2 maternal aunts had breast cancer.  . Breast cancer Maternal Grandmother      Current Outpatient Medications:  .  anagrelide ([name]) 1 MG capsule, TAKE 1 CAPSULE(1 MG) BY MOUTH DAILY, Disp: 30 capsule, Rfl: 5 .  aspirin 325 MG tablet, Take 325 mg by mouth daily., Disp: , Rfl:  .  atorvastatin (LIPITOR) 40 MG tablet, TAKE 1 TABLET BY MOUTH DAILY AT 6 PM, Disp: 90 tablet, Rfl: 3 .  B Complex Vitamins (B COMPLEX PO), Take 1 tablet by mouth daily. , Disp: , Rfl:  .  Blood Glucose Monitoring Suppl (ONETOUCH VERIO IQ SYSTEM) w/Device KIT, Use as directed to check blood sugars 1 time per day dx: e11.22, Disp: 1 kit, Rfl: 1 .  Blood Pressure Monitoring (BLOOD PRESSURE CUFF) MISC, Check blood pressure 1-2 times a day 3 h after taking BP medication.  Dx- CAD        HTN., Disp: 1 each, Rfl: 0 .  Cholecalciferol (VITAMIN D3) 2000 UNITS capsule, Take 2,000 Units by mouth daily. , Disp: , Rfl:  .  escitalopram (LEXAPRO) 10 MG tablet, 10 mg daily. Pt states as needed, Disp: , Rfl:  .  glucose blood (ONETOUCH VERIO) test strip, Use as instructed to check blood sugars 1 time per day dx: e11.22, Disp: 150 each, Rfl: 2 .  hydrochlorothiazide (MICROZIDE) 12.5 MG capsule, TAKE 1 CAPSULE(12.5 MG) BY MOUTH DAILY,   Disp: 30 capsule, Rfl: 2 .  metoprolol tartrate (LOPRESSOR) 50 MG tablet, TAKE 1 AND 1/2 TABLETS BY MOUTH TWICE A DAY, Disp: 90 tablet, Rfl: 5 .  OneTouch Delica Lancets 29U MISC, Use as directed to check blood sugars 1 time per day dx: e11.22, Disp: 150 each, Rfl: 2 .  telmisartan (MICARDIS) 80 MG tablet, TAKE 1 TABLET BY MOUTH EVERY DAY, Disp: 90 tablet, Rfl: 1 .  [name] 100 MG TABS, Take 1 tablet (100 mg total) by mouth 2 (two) times daily., Disp: 180 tablet, Rfl: 1 .  acetaminophen (TYLENOL) 325 MG tablet, Take 2 tablets (650 mg total) by mouth every 6 (six) hours as needed for mild pain. (Patient not taking: Reported on 10/28/2019), Disp: ,  Rfl:    Allergies  Allergen Reactions  . Sulfa Antibiotics Itching     Review of Systems  Constitutional: Negative.   Respiratory: Negative.   Cardiovascular: Negative.        States she had to wear heart monitor for 30 days. She reports she was called by cardiac nurse regarding episode of SVT that happened about 5am. She admits that she had taken off her CPAP about 4 or so that morning. She denies having any palpitations or cp at that time.   Gastrointestinal: Negative.   Neurological: Negative.   Psychiatric/Behavioral: Negative.      Today's Vitals   10/28/19 1117  BP: (!) 136/94  Pulse: 63  Temp: 98.3 F (36.8 C)  TempSrc: Oral  Weight: 198 lb 3.2 oz (89.9 kg)  Height: 5' 1.2" (1.554 m)   Body mass index is 37.21 kg/m.   Objective:  Physical Exam Vitals and nursing note reviewed.  Constitutional:      Appearance: Normal appearance. She is obese.  HENT:     Head: Normocephalic and atraumatic.  Cardiovascular:     Rate and Rhythm: Normal rate and regular rhythm.     Heart sounds: Normal heart sounds.  Pulmonary:     Effort: Pulmonary effort is normal.     Breath sounds: Normal breath sounds.  Skin:    General: Skin is warm.  Neurological:     General: No focal deficit present.     Mental Status: She is alert.  Psychiatric:        Mood and Affect: Mood normal.        Behavior: Behavior normal.         Assessment And Plan:     1. Type 2 diabetes mellitus with stage 2 chronic kidney disease, without long-term current use of insulin ([place])  Last a1c drawn Nov 2020; therefore, I will not check any labs today. Her BS are well controlled. She is currently exercising 2-3 times per week and is encouraged to increase to 4-5 times per week.   2. Pure hypercholesterolemia  Chronic. I will check fasting lipid panel today. She is encouraged to avoid fried foods and to again, incorporate more exercise into her daily routine.   - Lipid panel  3. Hypertensive heart  and renal disease with renal failure, stage 1 through stage 4 or unspecified chronic kidney disease, without heart failure  Chronic, fair control. She will continue with current meds. She is encouraged to avoid adding salt to her foods. Advised she will likely reach optimal BP control with current medication regimen if she were to increase activity as suggested. I will reassess at her next visit. She is scheduled for Welcome to Medicare visit in May 2021 (this can only be  done if she retires).   4. Essential thrombocythemia ([facility])  Chronic. Recent platelet count is above 450. She has f/u scheduled with Hematology later this month.   5. SVT (supraventricular tachycardia) ([facility])  Uncontrolled OSA and its link to arrhythmias was discussed with the patient. She is encouraged to wear her CPAP for a MINIMUM of 4.5 hours, but ideally she should wear as long as possible. Also advised to start magnesium nightly.   6. Class 2 severe obesity due to excess calories with serious comorbidity and body mass index (BMI) of 37.0 to 37.9 in adult ([facility])  She was congratulated on her weight loss thus far and encouraged to keep up the great work. Her next short term goal is reach 185 pounds. She is encouraged to keep up the great work!      N , MD    THE PATIENT IS ENCOURAGED TO PRACTICE SOCIAL DISTANCING DUE TO THE COVID-19 PANDEMIC.   

## 2019-10-28 NOTE — Patient Instructions (Signed)
Calm, magnesium supplementation, start with 1 tsp nightly  Supraventricular Tachycardia, Adult Supraventricular tachycardia (SVT) is a kind of abnormal heartbeat. It makes your heart beat very fast and then beat at a normal speed. A normal resting heartbeat is 60-100 times a minute. This condition can make your heart beat more than 150 times a minute. Times of having a fast heartbeat (episodes) can be scary, but they are usually not dangerous. In some cases, they may lead to heart failure if:  They happen many times per day.  Last longer than a few seconds. What are the causes?  A normal heartbeat starts when an area called the sinoatrial node sends out an electrical signal. In SVT, other areas of the heart send out signals that get in the way of the signal from the sinoatrial node. What increases the risk? You are more likely to develop this condition if you are:  67-27 years old.  A woman. The following factors may make you more likely to develop this condition:  Stress.  Tiredness.  Smoking.  Stimulant drugs, such as cocaine and methamphetamine.  Alcohol.  Caffeine.  Pregnancy.  Feeling worried or nervous (anxiety). What are the signs or symptoms?  A pounding heart.  A feeling that your heart is skipping beats (palpitations).  Weakness.  Trouble getting enough air.  Pain or tightness in your chest.  Feeling like you are going to pass out (faint).  Feeling worried or nervous.  Dizziness.  Sweating.  Feeling sick to your stomach (nausea).  Passing out.  Tiredness. Sometimes, there are no symptoms. How is this treated?  Vagal nerve stimulation. Ways to do this include: ? Holding your breath and pushing, as though you are pooping (having a bowel movement). ? Massaging an area on one side of your neck. Do not try this yourself. Only a doctor should do this. If done the wrong way, it can lead to a stroke. ? Bending forward with your head between your  legs. ? Coughing while bending forward with your head between your legs. ? Closing your eyes and massaging your eyeballs. Ask a doctor how to do this.  Medicines that prevent attacks.  Medicine to stop an attack given through an IV tube at the hospital.  A small electric shock (cardioversion) that stops an attack.  Radiofrequency ablation. In this procedure, a small, thin tube (catheter) is used to send energy to the area that is causing the rapid heartbeats. If you do not have symptoms, you may not need treatment. Follow these instructions at home: Stress  Avoid things that make you feel stressed.  To deal with stress, try: ? Doing yoga or meditation, or being out in nature. ? Listening to relaxing music. ? Doing deep breathing. ? Taking steps to be healthy, such as getting lots of sleep, exercising, and eating a balanced diet. ? Talking with a mental health doctor. Lifestyle   Try to get at least 7 hours of sleep each night.  Do not use any products that contain nicotine or tobacco, such as cigarettes, e-cigarettes, and chewing tobacco. If you need help quitting, ask your doctor.  Be aware of how alcohol affects you. ? If alcohol gives you a fast heartbeat, do not drink alcohol. ? If alcohol does not seem to give you a fast heartbeat, limit alcohol use to no more than 1 drink a day for women who are not pregnant, and 2 drinks a day for men. In the [place] one drink is one of these:  12 oz of beer (355 mL).  5 oz of wine (148 mL).  1 oz of hard liquor (44 mL).  Be aware of how caffeine affects you. ? If caffeine gives you a fast heartbeat, do not eat, drink, or use anything with caffeine in it. ? If caffeine does not seem to give you a fast heartbeat, limit how much caffeine you eat, drink, or use.  Do not use stimulant drugs. If you need help quitting, ask your doctor. General instructions  Stay at a healthy weight.  Exercise regularly. Ask your doctor about good  activities for you. Try one or a mixture of these: ? 150 minutes a week of gentle exercise, like walking or yoga. ? 75 minutes a week of exercise that is very active, like running or swimming.  Do vagus nerve treatments to slow down your heartbeat as told by your doctor.  Take over-the-counter and prescription medicines only as told by your doctor.  Keep all follow-up visits as told by your doctor. This is important. Contact a doctor if:  You have a fast heartbeat more often.  Times of having a fast heartbeat last longer than before.  Home treatments to slow down your heartbeat do not help.  You have new symptoms. Get help right away if:  You have chest pain.  Your symptoms get worse.  You have trouble breathing.  Your heart beats very fast for more than 20 minutes.  You pass out. These symptoms may be an emergency. Do not wait to see if the symptoms will go away. Get medical help right away. Call your local emergency services (911 in the [place] Do not drive yourself to the hospital. Summary  SVT is a type of abnormal heart beat.  This condition can make your heart beat more than 150 times a minute.  Treatment depends on how often the condition happens and your symptoms. This information is not intended to replace advice given to you by your health care provider. Make sure you discuss any questions you have with your health care provider. Document Revised: 08/28/2018 Document Reviewed: 08/28/2018 Elsevier Patient Education  2020 [employer].

## 2019-10-29 LAB — LIPID PANEL
Chol/HDL Ratio: 2.4 ratio (ref 0.0–4.4)
Cholesterol, Total: 102 mg/dL (ref 100–199)
HDL: 43 mg/dL (ref >39)
LDL Chol Calc (NIH): 47 mg/dL (ref 0–99)
Triglycerides: 46 mg/dL (ref 0–149)
VLDL Cholesterol Cal: 12 mg/dL (ref 5–40)

## 2019-11-15 ENCOUNTER — Inpatient Hospital Stay: Payer: BC Managed Care – PPO | Attending: Hematology

## 2019-11-15 ENCOUNTER — Other Ambulatory Visit: Payer: Self-pay

## 2019-11-15 DIAGNOSIS — D473 Essential (hemorrhagic) thrombocythemia: Secondary | ICD-10-CM | POA: Insufficient documentation

## 2019-11-15 DIAGNOSIS — Z0000 Encounter for general adult medical examination without abnormal findings: Secondary | ICD-10-CM

## 2019-11-15 LAB — CBC WITH DIFFERENTIAL/PLATELET
Abs Immature Granulocytes: 0.04 10*3/uL (ref 0.00–0.07)
Basophils Absolute: 0.1 10*3/uL (ref 0.0–0.1)
Basophils Relative: 1 %
Eosinophils Absolute: 0.4 10*3/uL (ref 0.0–0.5)
Eosinophils Relative: 5 %
HCT: 40.8 % (ref 36.0–46.0)
Hemoglobin: 13.3 g/dL (ref 12.0–15.0)
Immature Granulocytes: 1 %
Lymphocytes Relative: 24 %
Lymphs Abs: 1.8 10*3/uL (ref 0.7–4.0)
MCH: 29.4 pg (ref 26.0–34.0)
MCHC: 32.6 g/dL (ref 30.0–36.0)
MCV: 90.1 fL (ref 80.0–100.0)
Monocytes Absolute: 0.5 10*3/uL (ref 0.1–1.0)
Monocytes Relative: 6 %
Neutro Abs: 4.8 10*3/uL (ref 1.7–7.7)
Neutrophils Relative %: 63 %
Platelets: 406 10*3/uL — ABNORMAL HIGH (ref 150–400)
RBC: 4.53 MIL/uL (ref 3.87–5.11)
RDW: 13.4 % (ref 11.5–15.5)
WBC: 7.6 10*3/uL (ref 4.0–10.5)
nRBC: 0 % (ref 0.0–0.2)

## 2019-11-15 LAB — COMPREHENSIVE METABOLIC PANEL
ALT: 20 U/L (ref 0–44)
AST: 20 U/L (ref 15–41)
Albumin: 3.5 g/dL (ref 3.5–5.0)
Alkaline Phosphatase: 80 U/L (ref 38–126)
Anion gap: 9 (ref 5–15)
BUN: 21 mg/dL (ref 8–23)
CO2: 28 mmol/L (ref 22–32)
Calcium: 9.2 mg/dL (ref 8.9–10.3)
Chloride: 105 mmol/L (ref 98–111)
Creatinine, Ser: 1.28 mg/dL — ABNORMAL HIGH (ref 0.44–1.00)
GFR calc Af Amer: 51 mL/min — ABNORMAL LOW (ref >60)
GFR calc non Af Amer: 44 mL/min — ABNORMAL LOW (ref >60)
Glucose, Bld: 113 mg/dL — ABNORMAL HIGH (ref 70–99)
Potassium: 3.7 mmol/L (ref 3.5–5.1)
Sodium: 142 mmol/L (ref 135–145)
Total Bilirubin: 0.4 mg/dL (ref 0.3–1.2)
Total Protein: 6.5 g/dL (ref 6.5–8.1)

## 2019-11-17 ENCOUNTER — Encounter: Payer: Self-pay | Admitting: Hematology

## 2019-11-25 ENCOUNTER — Encounter: Payer: Self-pay | Admitting: Internal Medicine

## 2019-12-03 ENCOUNTER — Other Ambulatory Visit: Payer: Self-pay | Admitting: *Deleted

## 2019-12-03 MED ORDER — HYDROCHLOROTHIAZIDE 12.5 MG PO CAPS: ORAL_CAPSULE | ORAL | 1 refills | Status: DC

## 2019-12-03 NOTE — Telephone Encounter (Signed)
Rx has been sent to the pharmacy electronically. ° °

## 2019-12-22 ENCOUNTER — Encounter: Payer: Self-pay | Admitting: Internal Medicine

## 2019-12-25 ENCOUNTER — Encounter: Payer: Self-pay | Admitting: Adult Health

## 2019-12-25 ENCOUNTER — Other Ambulatory Visit: Payer: Self-pay | Admitting: Adult Health

## 2019-12-26 NOTE — Telephone Encounter (Signed)
Drug registry checked vimpat last fill 11-26-19 #60. (I called pharmacy and spoke to Merrilee Seashore,  He stated for some reason the precripttion should have another #60 on last fill, but was closed out so needs another prescription.

## 2019-12-27 ENCOUNTER — Encounter: Payer: Self-pay | Admitting: Adult Health

## 2019-12-27 NOTE — Progress Notes (Signed)
Sara [name]   [phone] [phone]   Clinic Follow up Note   Patient Care Team: [name], MD as PCP - General (Internal Medicine)  Date of Service:  01/03/2020  CHIEF COMPLAINT:  follow up Essential Thrombocytosis  CURRENT THERAPY:  -Hydroxyurea 500 mg daily, started in 2010., increased to hydrea 1088m daily on Monday and Thursday, and 5045mdaily for the rest of week in 01/2015, Aspirin 81 mg daily.  -Switched Hydrea to anagrelide1m110mily10/2017. Held and was on hydrea from 01/2019-5/2020due to Anagrelide on back order.Increase Anagrelide to 1mg57mily in AM and on MWF add 0.5mg 56mthe PM starting 01/03/20.   INTERVAL HISTORY:  Sara GLANDONere for a follow up of ET. She presents to the clinic alone.  She notes she is doing well. She notes Dr. SandeBaird CancerPCP put her on oral magnesium which helped her sleep. She often wakes up early 3-5am. She is thinking of retire from work later this year. She is still on medical leave and she needs copy of her medical records for. She is on Anagrelide 1mg d66my and tolerating well. She denies chest pain. I reviewed her medication list with her.    REVIEW OF SYSTEMS:   Constitutional: Denies fevers, chills or abnormal weight loss Eyes: Denies blurriness of vision Ears, nose, mouth, throat, and face: Denies mucositis or sore throat Respiratory: Denies cough, dyspnea or wheezes Cardiovascular: Denies palpitation, chest discomfort or lower extremity swelling Gastrointestinal:  Denies nausea, heartburn or change in bowel habits Skin: Denies abnormal skin rashes Lymphatics: Denies new lymphadenopathy or easy bruising Neurological:Denies numbness, tingling or new weaknesses Behavioral/Psych: Mood is stable, no new changes  All other systems were reviewed with the patient and are negative.  MEDICAL HISTORY:  Past Medical History:  Diagnosis Date  . Diabetes mellitus   . Gout   . Hyperlipemia     . Hypertension   . LVH (left ventricular hypertrophy)    12/21/09 Echo -mild asymmetric LVH,EF =>55%  . Morbid obesity (HCC)  [place] [facility] (obstructive sleep apnea)   . PAD (peripheral artery disease) (HCC)  [place] 2[place]/2012   PTCA and Stent - right  . Seizures (HCC)  [place]   . Thrombocytosis (HCC)  [place]: Past Surgical History:  Procedure Laterality Date  . BREAST CYST EXCISION     S/P Benign Right Breast Cyst Removal.  . BREAST REDUCTION SURGERY  1995   S/P Bilateral breast reduction  [place].  . CORONARY ARTERY BYPASS GRAFT N/A 04/21/2017   Procedure: CORONARY ARTERY BYPASS GRAFTING (CABG) x 2, USING LEFT MAMMARY ARTERY AND RIGHT GREATER SAPHENOUS VEIN HARVESTED ENDOSCOPICALLY;  Surgeon: BartleGaye Pollack [facility];[place]: Open Heart Surgery;  Laterality: N/A;  . EXTREMITY CYST EXCISION  1993   left wrist  . LEFT HEART CATH AND CORONARY ANGIOGRAPHY N/A 04/17/2017   Procedure: Left Heart Cath and Coronary Angiography;  Surgeon: VaranaJettie Booze [facility];  Service: Cardiovascular;  Laterality: N/A;  . PARTIAL HYSTERECTOMY  1994  . renal artery stent placement Right   . TEE WITHOUT CARDIOVERSION N/A 04/21/2017   Procedure: TRANSESOPHAGEAL ECHOCARDIOGRAM (TEE);  Surgeon: BartleGaye Pollack [facility];[place];  Laterality: N/A;    I have reviewed the social history and family history with the  patient and they are unchanged from previous note.  ALLERGIES:  is allergic to sulfa antibiotics.  MEDICATIONS:  Current Outpatient Medications  Medication Sig Dispense Refill  . acetaminophen (TYLENOL) 325 MG tablet Take 2 tablets (650 mg total) by mouth every 6 (six) hours as needed for mild pain. (Patient not taking: Reported on 10/28/2019)    . anagrelide ([name]) 0.5 MG capsule Take 1 capsule (0.5 mg total) by mouth at bedtime. On  Mondays, Wednesdays and Fridays 15 capsule 2  . anagrelide ([name]) 1 MG capsule Take 1 capsule (1 mg total) by mouth daily. 30 capsule 2  . aspirin 325 MG tablet Take 325 mg by mouth daily.    Marland Kitchen atorvastatin (LIPITOR) 40 MG tablet TAKE 1 TABLET BY MOUTH DAILY AT 6 PM 90 tablet 3  . B Complex Vitamins (B COMPLEX PO) Take 1 tablet by mouth daily.     . Blood Glucose Monitoring Suppl (ONETOUCH VERIO IQ SYSTEM) w/Device KIT Use as directed to check blood sugars 1 time per day dx: e11.22 1 kit 1  . Blood Pressure Monitoring (BLOOD PRESSURE CUFF) MISC Check blood pressure 1-2 times a day 3 h after taking BP medication.  Dx- CAD        HTN. 1 each 0  . Cholecalciferol (VITAMIN D3) 2000 UNITS capsule Take 2,000 Units by mouth daily.     Marland Kitchen escitalopram (LEXAPRO) 10 MG tablet 10 mg daily. Pt states as needed    . glucose blood (ONETOUCH VERIO) test strip Use as instructed to check blood sugars 1 time per day dx: e11.22 150 each 2  . hydrochlorothiazide (MICROZIDE) 12.5 MG capsule TAKE 1 CAPSULE(12.5 MG) BY MOUTH DAILY 90 capsule 1  . metoprolol tartrate (LOPRESSOR) 50 MG tablet TAKE 1 AND 1/2 TABLETS BY MOUTH TWICE A DAY 90 tablet 3  . OneTouch Delica Lancets 09G MISC Use as directed to check blood sugars 1 time per day dx: e11.22 150 each 2  . telmisartan (MICARDIS) 80 MG tablet TAKE 1 TABLET BY MOUTH EVERY DAY 90 tablet 1  . [name] 100 MG TABS TAKE 1 TABLET(100 MG) BY MOUTH TWICE DAILY 180 tablet 1   No current facility-administered medications for this visit.    PHYSICAL EXAMINATION: ECOG PERFORMANCE STATUS: 1 - Symptomatic but completely ambulatory  Vitals:   01/03/20 1056  BP: 133/71  Pulse: 66  Resp: 18  Temp: 98.5 F (36.9 C)  SpO2: 100%   Filed Weights   01/03/20 1056  Weight: 204 lb 8 oz (92.8 kg)    Due to COVID19 we will limit examination to appearance. Patient had no complaints.  GENERAL:alert, no distress and comfortable SKIN: skin color normal, no rashes or significant  lesions EYES: normal, Conjunctiva are pink and non-injected, sclera clear  NEURO: alert & oriented x 3 with fluent speech   LABORATORY DATA:  I have reviewed the data as listed CBC Latest Ref Rng & Units 01/03/2020 11/15/2019 10/04/2019  WBC 4.0 - 10.5 K/uL 7.8 7.6 7.8  Hemoglobin 12.0 - 15.0 g/dL 13.7 13.3 13.3  Hematocrit 36.0 - 46.0 % 42.1 40.8 41.6  Platelets 150 - 400 K/uL 491(H) 406(H) 460(H)     CMP Latest Ref Rng & Units 01/03/2020 11/15/2019 09/02/2019  Glucose 70 - 99 mg/dL 99 113(H) 107(H)  BUN 8 - 23 mg/dL _0 Creatinine 0.44 - 1.00 mg/dL 1.21(H) 1.28(H) 1.21(H)  Sodium 135 - 145 mmol/L 137 142 139  Potassium 3.5 - 5.1 mmol/L 3.7 3.7 4.3  Chloride 98 - 111 mmol/L 100 105 101  CO2 22 - 32 mmol/L _0 Calcium 8.9 - 10.3 mg/dL 9.5 9.2 9.8  Total Protein 6.5 - 8.1 g/dL 6.6 6.5 -  Total Bilirubin 0.3 - 1.2 mg/dL 0.4 0.4 -  Alkaline Phos 38 - 126 U/L 86 80 -  AST 15 - 41 U/L 23 20 -  ALT 0 - 44 U/L 28 20 -      RADIOGRAPHIC STUDIES: I have personally reviewed the radiological images as listed and agreed with the findings in the report. No results found.   ASSESSMENT & PLAN:  VERBIE BABIC is a 64 y.o. female with    1. Essential Thrombocythemia. JAK2 (+) -Diagnosed in 2010. JAK2 mutation was present. Her bone marrow aspirate and biopsy on 01/20/2009 which yielded a specimen that was suboptimal for evaluation. However, megakaryocytes were abundant with normal morphology. There was no clustering or excess blasts seen. Platelet count on 01/20/2009 was 881. -She was on Hydrea previously, had episodes of dizziness and syncope, and she was concerned about the risk of leukemia, so it was stopped. The patient is being treated with aspirin and Anagrelide 27m daily now.  -She is clinically doing well and stable. She is tolerating Anagrelide well. Labs reviewed, Plt 491K, Cr 1.21.  -Will continue Anagrelide to 157min AM and on MWF add 0.2m57mn the PM starting 01/03/20.   -Lab in 6 weeks. F/u in 3 months   2. H/o of Seizure, Recurrent Syncope episodes -Her Syncope have unclear etiology. -She has a heart monitor on now for evaluation and latest report showed sinus tachy SVT was faster than normal for 24 seconds on 10/02/19.  -I discussed this syncope can still be related to her Seizure.  -continue meds, follow up with her neurologist.  4. Diabetes, HTN -She has been seeing a nutritionist at church. -She monitors her sugar level regularly -continue meds and f/uwith PCP  5. Coronary artery disease -Coronary artery disease status post bypass surgery grafting x 2 by Dr. BarCyndia Bent June 2018. -She completed cardiac rehab   PLAN: -Continue anagrelide 1 mgdaily in the AM and on MWF add 0.2mg59m the PM -lab in 6 weeks  -Lab and f/u in 3 months   No problem-specific Assessment & Plan notes found for this encounter.   No orders of the defined types were placed in this encounter.  All questions were answered. The patient knows to call the clinic with any problems, questions or concerns. No barriers to learning was detected. The total time spent in the appointment was 20 minutes.     Shaquille Janes Truitt Merle 01/03/2020   I, AmoyJoslyn Devon acting as scribe for Salwa Bai Truitt Merle.   I have reviewed the above documentation for accuracy and completeness, and I agree with the above.

## 2020-01-02 ENCOUNTER — Other Ambulatory Visit: Payer: Self-pay | Admitting: Internal Medicine

## 2020-01-03 ENCOUNTER — Encounter: Payer: Self-pay | Admitting: Hematology

## 2020-01-03 ENCOUNTER — Other Ambulatory Visit: Payer: Self-pay

## 2020-01-03 ENCOUNTER — Telehealth: Payer: Self-pay | Admitting: Hematology

## 2020-01-03 ENCOUNTER — Inpatient Hospital Stay: Payer: BC Managed Care – PPO

## 2020-01-03 ENCOUNTER — Inpatient Hospital Stay: Payer: BC Managed Care – PPO | Attending: Hematology | Admitting: Hematology

## 2020-01-03 VITALS — BP 133/71 | HR 66 | Temp 98.5°F | Resp 18 | Ht 62.0 in | Wt 204.5 lb

## 2020-01-03 DIAGNOSIS — R569 Unspecified convulsions: Secondary | ICD-10-CM | POA: Diagnosis not present

## 2020-01-03 DIAGNOSIS — Z951 Presence of aortocoronary bypass graft: Secondary | ICD-10-CM | POA: Diagnosis not present

## 2020-01-03 DIAGNOSIS — D473 Essential (hemorrhagic) thrombocythemia: Secondary | ICD-10-CM | POA: Diagnosis not present

## 2020-01-03 DIAGNOSIS — Z7902 Long term (current) use of antithrombotics/antiplatelets: Secondary | ICD-10-CM | POA: Diagnosis not present

## 2020-01-03 DIAGNOSIS — E1151 Type 2 diabetes mellitus with diabetic peripheral angiopathy without gangrene: Secondary | ICD-10-CM | POA: Diagnosis not present

## 2020-01-03 DIAGNOSIS — I119 Hypertensive heart disease without heart failure: Secondary | ICD-10-CM | POA: Diagnosis not present

## 2020-01-03 DIAGNOSIS — Z7982 Long term (current) use of aspirin: Secondary | ICD-10-CM | POA: Insufficient documentation

## 2020-01-03 DIAGNOSIS — R55 Syncope and collapse: Secondary | ICD-10-CM | POA: Insufficient documentation

## 2020-01-03 DIAGNOSIS — I2510 Atherosclerotic heart disease of native coronary artery without angina pectoris: Secondary | ICD-10-CM | POA: Diagnosis not present

## 2020-01-03 DIAGNOSIS — Z0000 Encounter for general adult medical examination without abnormal findings: Secondary | ICD-10-CM

## 2020-01-03 LAB — COMPREHENSIVE METABOLIC PANEL
ALT: 28 U/L (ref 0–44)
AST: 23 U/L (ref 15–41)
Albumin: 3.4 g/dL — ABNORMAL LOW (ref 3.5–5.0)
Alkaline Phosphatase: 86 U/L (ref 38–126)
Anion gap: 7 (ref 5–15)
BUN: 22 mg/dL (ref 8–23)
CO2: 30 mmol/L (ref 22–32)
Calcium: 9.5 mg/dL (ref 8.9–10.3)
Chloride: 100 mmol/L (ref 98–111)
Creatinine, Ser: 1.21 mg/dL — ABNORMAL HIGH (ref 0.44–1.00)
GFR calc Af Amer: 54 mL/min — ABNORMAL LOW (ref >60)
GFR calc non Af Amer: 47 mL/min — ABNORMAL LOW (ref >60)
Glucose, Bld: 99 mg/dL (ref 70–99)
Potassium: 3.7 mmol/L (ref 3.5–5.1)
Sodium: 137 mmol/L (ref 135–145)
Total Bilirubin: 0.4 mg/dL (ref 0.3–1.2)
Total Protein: 6.6 g/dL (ref 6.5–8.1)

## 2020-01-03 LAB — CBC WITH DIFFERENTIAL/PLATELET
Abs Immature Granulocytes: 0.04 10*3/uL (ref 0.00–0.07)
Basophils Absolute: 0.1 10*3/uL (ref 0.0–0.1)
Basophils Relative: 1 %
Eosinophils Absolute: 0.3 10*3/uL (ref 0.0–0.5)
Eosinophils Relative: 4 %
HCT: 42.1 % (ref 36.0–46.0)
Hemoglobin: 13.7 g/dL (ref 12.0–15.0)
Immature Granulocytes: 1 %
Lymphocytes Relative: 20 %
Lymphs Abs: 1.6 10*3/uL (ref 0.7–4.0)
MCH: 29.3 pg (ref 26.0–34.0)
MCHC: 32.5 g/dL (ref 30.0–36.0)
MCV: 90.1 fL (ref 80.0–100.0)
Monocytes Absolute: 0.6 10*3/uL (ref 0.1–1.0)
Monocytes Relative: 8 %
Neutro Abs: 5.2 10*3/uL (ref 1.7–7.7)
Neutrophils Relative %: 66 %
Platelets: 491 10*3/uL — ABNORMAL HIGH (ref 150–400)
RBC: 4.67 MIL/uL (ref 3.87–5.11)
RDW: 13.2 % (ref 11.5–15.5)
WBC: 7.8 10*3/uL (ref 4.0–10.5)
nRBC: 0 % (ref 0.0–0.2)

## 2020-01-03 MED ORDER — ANAGRELIDE HCL 1 MG PO CAPS: 1.0000 mg | ORAL_CAPSULE | Freq: Every day | ORAL | 2 refills | Status: DC

## 2020-01-03 MED ORDER — METOPROLOL TARTRATE 50 MG PO TABS: ORAL_TABLET | ORAL | 3 refills | Status: DC

## 2020-01-03 MED ORDER — ANAGRELIDE HCL 0.5 MG PO CAPS: 0.5000 mg | ORAL_CAPSULE | Freq: Every evening | ORAL | 2 refills | Status: DC

## 2020-01-03 NOTE — Telephone Encounter (Signed)
Scheduled per 03/12 los, patient will notified via My chart.

## 2020-01-03 NOTE — Telephone Encounter (Signed)
Informed pt her rx for Metoprolol Tartrate has been called into [facility].

## 2020-01-04 ENCOUNTER — Encounter: Payer: Self-pay | Admitting: Hematology

## 2020-01-08 ENCOUNTER — Encounter: Payer: Self-pay | Admitting: Hematology

## 2020-01-08 ENCOUNTER — Encounter: Payer: Self-pay | Admitting: Internal Medicine

## 2020-01-08 ENCOUNTER — Encounter: Payer: Self-pay | Admitting: Adult Health

## 2020-02-09 ENCOUNTER — Encounter: Payer: Self-pay | Admitting: Hematology

## 2020-02-14 ENCOUNTER — Other Ambulatory Visit: Payer: Self-pay

## 2020-02-14 ENCOUNTER — Inpatient Hospital Stay: Payer: BC Managed Care – PPO | Attending: Hematology

## 2020-02-14 DIAGNOSIS — Z0000 Encounter for general adult medical examination without abnormal findings: Secondary | ICD-10-CM

## 2020-02-14 DIAGNOSIS — D473 Essential (hemorrhagic) thrombocythemia: Secondary | ICD-10-CM | POA: Diagnosis present

## 2020-02-14 LAB — CBC WITH DIFFERENTIAL/PLATELET
Abs Immature Granulocytes: 0.03 10*3/uL (ref 0.00–0.07)
Basophils Absolute: 0.1 10*3/uL (ref 0.0–0.1)
Basophils Relative: 1 %
Eosinophils Absolute: 0.3 10*3/uL (ref 0.0–0.5)
Eosinophils Relative: 4 %
HCT: 43.6 % (ref 36.0–46.0)
Hemoglobin: 14.3 g/dL (ref 12.0–15.0)
Immature Granulocytes: 0 %
Lymphocytes Relative: 21 %
Lymphs Abs: 1.5 10*3/uL (ref 0.7–4.0)
MCH: 29.4 pg (ref 26.0–34.0)
MCHC: 32.8 g/dL (ref 30.0–36.0)
MCV: 89.7 fL (ref 80.0–100.0)
Monocytes Absolute: 0.6 10*3/uL (ref 0.1–1.0)
Monocytes Relative: 8 %
Neutro Abs: 4.8 10*3/uL (ref 1.7–7.7)
Neutrophils Relative %: 66 %
Platelets: 326 10*3/uL (ref 150–400)
RBC: 4.86 MIL/uL (ref 3.87–5.11)
RDW: 13.3 % (ref 11.5–15.5)
WBC: 7.3 10*3/uL (ref 4.0–10.5)
nRBC: 0 % (ref 0.0–0.2)

## 2020-02-19 ENCOUNTER — Encounter: Payer: Self-pay | Admitting: Hematology

## 2020-02-25 ENCOUNTER — Encounter: Payer: Self-pay | Admitting: Internal Medicine

## 2020-02-25 ENCOUNTER — Other Ambulatory Visit: Payer: Self-pay

## 2020-02-25 ENCOUNTER — Telehealth: Payer: Self-pay

## 2020-02-25 ENCOUNTER — Ambulatory Visit: Payer: BC Managed Care – PPO | Admitting: Internal Medicine

## 2020-02-25 ENCOUNTER — Encounter: Payer: BC Managed Care – PPO | Admitting: Internal Medicine

## 2020-02-25 VITALS — BP 136/90 | HR 73 | Temp 98.3°F | Ht 62.0 in | Wt 196.8 lb

## 2020-02-25 DIAGNOSIS — E1122 Type 2 diabetes mellitus with diabetic chronic kidney disease: Secondary | ICD-10-CM | POA: Diagnosis not present

## 2020-02-25 DIAGNOSIS — N183 Chronic kidney disease, stage 3 unspecified: Secondary | ICD-10-CM

## 2020-02-25 DIAGNOSIS — I2510 Atherosclerotic heart disease of native coronary artery without angina pectoris: Secondary | ICD-10-CM

## 2020-02-25 DIAGNOSIS — G4089 Other seizures: Secondary | ICD-10-CM

## 2020-02-25 DIAGNOSIS — Z23 Encounter for immunization: Secondary | ICD-10-CM

## 2020-02-25 DIAGNOSIS — Z6836 Body mass index (BMI) 36.0-36.9, adult: Secondary | ICD-10-CM

## 2020-02-25 DIAGNOSIS — I1310 Hypertensive heart and chronic kidney disease without heart failure, with stage 1 through stage 4 chronic kidney disease, or unspecified chronic kidney disease: Secondary | ICD-10-CM | POA: Diagnosis not present

## 2020-02-25 MED ORDER — PNEUMOCOCCAL 13-VAL CONJ VACC IM SUSP: 0.5000 mL | INTRAMUSCULAR | 0 refills | Status: AC

## 2020-02-25 NOTE — Progress Notes (Signed)
This visit occurred during the SARS-CoV-2 public health emergency.  Safety protocols were in place, including screening questions prior to the visit, additional usage of staff PPE, and extensive cleaning of exam room while observing appropriate contact time as indicated for disinfecting solutions.  Subjective:     Patient ID: Sara [name] , female    DOB: 1956-04-23 , 64 y.o.   MRN: [id number]   Chief Complaint  Patient presents with  . Diabetes  . Hypertension    HPI  She is here today for diabetes/BP check. She reports compliance with meds. She reports she has been trying to do better with her eating.   Diabetes She presents for her follow-up diabetic visit. She has type 2 diabetes mellitus. There are no hypoglycemic associated symptoms. Pertinent negatives for diabetes include no blurred vision and no chest pain. There are no hypoglycemic complications. Diabetic complications include heart disease. Risk factors for coronary artery disease include dyslipidemia, diabetes mellitus, hypertension, obesity, post-menopausal and sedentary lifestyle. She is compliant with treatment most of the time. She is following a diabetic diet. She never participates in exercise. An ACE inhibitor/angiotensin II receptor blocker is being taken. Eye exam is not current.  Hypertension This is a chronic problem. The current episode started more than 1 year ago. The problem has been gradually improving since onset. The problem is uncontrolled. Pertinent negatives include no blurred vision, chest pain, palpitations or shortness of breath. The current treatment provides moderate improvement. Compliance problems include exercise.  Hypertensive end-organ damage includes CAD/MI.     Past Medical History:  Diagnosis Date  . Diabetes mellitus   . Gout   . Hyperlipemia   . Hypertension   . LVH (left ventricular hypertrophy)    12/21/09 Echo -mild asymmetric LVH,EF =>55%  . Morbid obesity ([place])   . OSA  (obstructive sleep apnea)   . PAD (peripheral artery disease) ([place])   . Renal artery stenosis ([place]) 12/13/2010   PTCA and Stent - right  . Seizures ([place])   . Syncopal episodes   . Thrombocytosis (HCC)      Family History  Problem Relation Age of Onset  . Heart failure Mother   . Diabetes Father   . Diabetes Sister   . Hypertension Sister   . Hypertension Sister   . Hypertension Sister   . Breast cancer Maternal Aunt        2 maternal aunts had breast cancer.  . Breast cancer Maternal Grandmother      Current Outpatient Medications:  .  acetaminophen (TYLENOL) 325 MG tablet, Take 2 tablets (650 mg total) by mouth every 6 (six) hours as needed for mild pain., Disp: , Rfl:  .  anagrelide ([name]) 0.5 MG capsule, Take 1 capsule (0.5 mg total) by mouth at bedtime. On Mondays, Wednesdays and Fridays, Disp: 15 capsule, Rfl: 2 .  anagrelide ([name]) 1 MG capsule, Take 1 capsule (1 mg total) by mouth daily., Disp: 30 capsule, Rfl: 2 .  atorvastatin (LIPITOR) 40 MG tablet, TAKE 1 TABLET BY MOUTH DAILY AT 6 PM, Disp: 90 tablet, Rfl: 3 .  B Complex Vitamins (B COMPLEX PO), Take 1 tablet by mouth daily. , Disp: , Rfl:  .  Blood Glucose Monitoring Suppl (ONETOUCH VERIO IQ SYSTEM) w/Device KIT, Use as directed to check blood sugars 1 time per day dx: e11.22, Disp: 1 kit, Rfl: 1 .  Blood Pressure Monitoring (BLOOD PRESSURE CUFF) MISC, Check blood pressure 1-2 times a day 3 h after taking BP medication.  Dx- CAD        HTN., Disp: 1 each, Rfl: 0 .  Cholecalciferol (VITAMIN D3) 2000 UNITS capsule, Take 2,000 Units by mouth daily. , Disp: , Rfl:  .  escitalopram (LEXAPRO) 10 MG tablet, 10 mg daily. Pt states as needed, Disp: , Rfl:  .  glucose blood (ONETOUCH VERIO) test strip, Use as instructed to check blood sugars 1 time per day dx: e11.22, Disp: 150 each, Rfl: 2 .  hydrochlorothiazide (MICROZIDE) 12.5 MG capsule, TAKE 1 CAPSULE(12.5 MG) BY MOUTH DAILY, Disp: 90 capsule, Rfl: 1 .  metoprolol  tartrate (LOPRESSOR) 50 MG tablet, TAKE 1 AND 1/2 TABLETS BY MOUTH TWICE A DAY, Disp: 90 tablet, Rfl: 3 .  OneTouch Delica Lancets 46K MISC, Use as directed to check blood sugars 1 time per day dx: e11.22, Disp: 150 each, Rfl: 2 .  telmisartan (MICARDIS) 80 MG tablet, TAKE 1 TABLET BY MOUTH EVERY DAY, Disp: 90 tablet, Rfl: 1 .  [name] 100 MG TABS, TAKE 1 TABLET(100 MG) BY MOUTH TWICE DAILY, Disp: 180 tablet, Rfl: 1   Allergies  Allergen Reactions  . Sulfa Antibiotics Itching     Review of Systems  Constitutional: Negative.   Eyes: Negative for blurred vision.  Respiratory: Negative.  Negative for shortness of breath.   Cardiovascular: Negative.  Negative for chest pain and palpitations.  Gastrointestinal: Negative.   Neurological: Negative.   Psychiatric/Behavioral: Negative.      Today's Vitals   02/25/20 1126  BP: 136/90  Pulse: 73  Temp: 98.3 F (36.8 C)  TempSrc: Oral  Weight: 196 lb 12.8 oz (89.3 kg)  Height: _0  (1.575 m)  PainSc: 0-No pain   Body mass index is 36 kg/m.   Wt Readings from Last 3 Encounters:  02/25/20 196 lb 12.8 oz (89.3 kg)  01/03/20 204 lb 8 oz (92.8 kg)  10/28/19 198 lb 3.2 oz (89.9 kg)     Objective:  Physical Exam Vitals and nursing note reviewed.  Constitutional:      Appearance: Normal appearance. She is obese.  HENT:     Head: Normocephalic and atraumatic.  Cardiovascular:     Rate and Rhythm: Normal rate and regular rhythm.     Heart sounds: Normal heart sounds.  Pulmonary:     Effort: Pulmonary effort is normal.     Breath sounds: Normal breath sounds.  Skin:    General: Skin is warm.  Neurological:     General: No focal deficit present.     Mental Status: She is alert.  Psychiatric:        Mood and Affect: Mood normal.        Behavior: Behavior normal.         Assessment And Plan:     1. Type 2 diabetes mellitus with stage 3 chronic kidney disease, without long-term current use of insulin, unspecified whether  stage 3a or 3b CKD (HCC)  Chronic, I will check an a1c today. She was congratulated on her lifestyle changes and encouraged to keep up the great work. I will also check labs as listed below to further work up her stage 3 CKD.   - Protein electrophoresis, serum - Phosphorus - Hemoglobin A1c - Parathyroid Hormone, Intact w/Ca  2. Hypertensive heart and renal disease with renal failure, stage 1 through stage 4 or unspecified chronic kidney disease, without heart failure  Fair control. She will continue with current meds for now. Encouraged to work up to walking 30 minutes five days per  week.   3. CAD s/p CABG   Chronic, yet stable. She is encouraged to continue to follow a heart healthy diet.   4. Immunization due  She has had both of her COVID vaccines. Pt advised that she is due for Prevnar-13. I will send to her local pharmacy. Since she is unsure of dates of COVID vaccine administration, she is advised to send this information to me. Advised to wait at least 30 days from second COVID vaccine to receive Prevnar vaccine. It will be sent to her local pharmacy.   5. Other seizures ([place])  Chronic, as per Neurology. She reports she has not had any "episodes" since her last visit. She is encouraged to continue to take meds as prescribed.   6. Class 2 severe obesity due to excess calories with serious comorbidity and body mass index (BMI) of 36.0 to 36.9 in adult [facility])  She was congratulated on her 8 pound weight loss and encouraged to keep up the great work. Advised to aim for at least 150 minutes of exercise per week.    [name], MD    THE PATIENT IS ENCOURAGED TO PRACTICE SOCIAL DISTANCING DUE TO THE COVID-19 PANDEMIC.

## 2020-02-25 NOTE — Patient Instructions (Signed)

## 2020-02-25 NOTE — Telephone Encounter (Signed)
CALLED PT ABOUT HER [employer]IG[employer] MEDICARE DUE TO UNABLE TO FIND SUBSCRIBER ID, ADV PT THAT IF IT IS [employer] WE ARE OUT OF NETWORK AND [facility] WILL NOT COVERED. [employer] SENT, [place], AND [employer]ESOURCE CHECKED WAS UNABLE TO LOCATE SUBSCRIBER ID.PATIENT STATED THAT SHE AGREES TO ALL CHARGES THAT MAY OCCUR DUE TO SHE DID NOT HAVE ALL HER INSURANCE INFO WITH HER . [employer] MEDICARE EFF 02/22/20 NO CARDS AVAILABLE

## 2020-02-27 LAB — PROTEIN ELECTROPHORESIS, SERUM
A/G Ratio: 1.1 (ref 0.7–1.7)
Albumin ELP: 3.4 g/dL (ref 2.9–4.4)
Alpha 1: 0.2 g/dL (ref 0.0–0.4)
Alpha 2: 0.8 g/dL (ref 0.4–1.0)
Beta: 1 g/dL (ref 0.7–1.3)
Gamma Globulin: 1 g/dL (ref 0.4–1.8)
Globulin, Total: 3 g/dL (ref 2.2–3.9)
Total Protein: 6.4 g/dL (ref 6.0–8.5)

## 2020-02-27 LAB — PTH, INTACT AND CALCIUM
Calcium: 10 mg/dL (ref 8.7–10.3)
PTH: 29 pg/mL (ref 15–65)

## 2020-02-27 LAB — PHOSPHORUS: Phosphorus: 3.3 mg/dL (ref 3.0–4.3)

## 2020-02-27 LAB — HEMOGLOBIN A1C
Est. average glucose Bld gHb Est-mCnc: 134 mg/dL
Hgb A1c MFr Bld: 6.3 % — ABNORMAL HIGH (ref 4.8–5.6)

## 2020-03-02 ENCOUNTER — Other Ambulatory Visit: Payer: Self-pay

## 2020-03-02 ENCOUNTER — Ambulatory Visit: Payer: BC Managed Care – PPO | Admitting: Neurology

## 2020-03-02 ENCOUNTER — Encounter: Payer: Self-pay | Admitting: Neurology

## 2020-03-02 VITALS — BP 156/78 | HR 68 | Temp 98.2°F | Ht 61.0 in | Wt 201.0 lb

## 2020-03-02 DIAGNOSIS — R569 Unspecified convulsions: Secondary | ICD-10-CM | POA: Diagnosis not present

## 2020-03-02 DIAGNOSIS — G4733 Obstructive sleep apnea (adult) (pediatric): Secondary | ICD-10-CM

## 2020-03-02 NOTE — Progress Notes (Signed)
I have read the note, and I agree with the clinical assessment and plan.  Sara [name]   

## 2020-03-02 NOTE — Patient Instructions (Signed)
It was great to see you today  Continue [name] as prescribed  Monitor blood sugars at night, consider eating a snack before bed Will send order for mask refit  Keep appointment in September for CPAP follow-up

## 2020-03-02 NOTE — Progress Notes (Signed)
PATIENT: Sara [name] DOB: 10/27/1955  REASON FOR VISIT: follow up HISTORY FROM: patient  HISTORY OF PRESENT ILLNESS: Today 03/02/20  Sara [name] is a 64 year old female with history of seizures and obstructive sleep apnea on CPAP.  Her seizures are typically characterized as staring off.  There was question of possible nocturnal seizure last year when she was taking Ozempic, question possible nocturnal seizure described as chewing around 2 AM.  [name] level  was normal, she remained on [name] 100 mg twice daily.  She is overall doing well, has reported 1 or 2 episodes her husband describes as, her making a loud noise, he couldn't wake her, when wearing CPAP, he went back to sleep. She has been trying to not eat before bed.  Recent A1c was 6.3.  She reports increase in stress.  She says when her husband notices these episodes, it it when she she is really tired, and not well rested.  She really does not want to go up on [name], she requires brand-name.  She presents today for evaluation unaccompanied.  Checked CPAP for equipment function, used for more than 4 hours, 29 out of 30 days, 97%, average usage (days used) 6 hours 44 minutes, set pressure 9 cmH2O, leak in the 95th percentile was 48.5, AHI 2.8.  HISTORY. 09/03/2019 SS: Sara [name] is a 64 year old female with history of seizures and obstructive sleep apnea on CPAP.  The purpose of today's visit is on urgent basis, for concern of nocturnal seizures.  She was taking Ozempic for diabetes but stopped due to low blood sugars at nighttime.  She indicates she has had episodes of possible nocturnal seizures, evidenced by chewing around 2:00 am.  Her husband reports she was difficult to arouse, but she did not check her blood sugar at the time.  She has remained on [name] 100 mg twice a day.  Historically, her typical seizures occurred during the daytime, and were characterized by staring off.  She indicates she has not missed any doses of  [name].  She says she was taking Ozempic to help improve her blood sugars, along with weight loss.  She says about 1 week before the episode, she had stopped Ozempic due to low blood sugars.  The first event occurred on 10/31, while sleeping.  She says she was wearing her CPAP, her husband said she was doing a chewing motion with her mouth, and he could not wake her.  She said he put maple syrup in her mask, but not actually in her mouth.  She says, he must not of been that concerned, because he went off to sleep.  She said this particular night, she just returned from a 1 week visit with her daughter, and she had not been resting well.  The second event occurred on 11/5, again was witnessed by her husband, he said she was shaking a little bit.  He again put maple syrup around her mouth, and went off to sleep himself.  She said on both occasions she woke up the next morning with maple syrup around her mouth.  She denies any urinary incontinence with either episode.  She says during this time, she had not been eating past 7:00, and has been eating healthy foods.  She does not feel this represents a seizure, she feels this was a hypoglycemic episode.  She says her husband is very anxious, and worries.  She does note, he must not have been that concerned, because he did not pursue the  situation further and went off to sleep himself.  A1c at her primary office yesterday was 5.8.  She presents today for follow-up unaccompanied.   REVIEW OF SYSTEMS: Out of a complete 14 system review of symptoms, the patient complains only of the following symptoms, and all other reviewed systems are negative.  Seizure  ALLERGIES: Allergies  Allergen Reactions  . Sulfa Antibiotics Itching    HOME MEDICATIONS: Outpatient Medications Prior to Visit  Medication Sig Dispense Refill  . acetaminophen (TYLENOL) 325 MG tablet Take 2 tablets (650 mg total) by mouth every 6 (six) hours as needed for mild pain.    Marland Kitchen anagrelide  ([name]) 0.5 MG capsule Take 1 capsule (0.5 mg total) by mouth at bedtime. On Mondays, Wednesdays and Fridays 15 capsule 2  . anagrelide ([name]) 1 MG capsule Take 1 capsule (1 mg total) by mouth daily. 30 capsule 2  . atorvastatin (LIPITOR) 40 MG tablet TAKE 1 TABLET BY MOUTH DAILY AT 6 PM 90 tablet 3  . B Complex Vitamins (B COMPLEX PO) Take 1 tablet by mouth daily.     . Blood Glucose Monitoring Suppl (ONETOUCH VERIO IQ SYSTEM) w/Device KIT Use as directed to check blood sugars 1 time per day dx: e11.22 1 kit 1  . Blood Pressure Monitoring (BLOOD PRESSURE CUFF) MISC Check blood pressure 1-2 times a day 3 h after taking BP medication.  Dx- CAD        HTN. 1 each 0  . Cholecalciferol (VITAMIN D3) 2000 UNITS capsule Take 2,000 Units by mouth daily.     Marland Kitchen escitalopram (LEXAPRO) 10 MG tablet 10 mg daily. Pt states as needed    . glucose blood (ONETOUCH VERIO) test strip Use as instructed to check blood sugars 1 time per day dx: e11.22 150 each 2  . hydrochlorothiazide (MICROZIDE) 12.5 MG capsule TAKE 1 CAPSULE(12.5 MG) BY MOUTH DAILY 90 capsule 1  . metoprolol tartrate (LOPRESSOR) 50 MG tablet TAKE 1 AND 1/2 TABLETS BY MOUTH TWICE A DAY 90 tablet 3  . OneTouch Delica Lancets 93Y MISC Use as directed to check blood sugars 1 time per day dx: e11.22 150 each 2  . telmisartan (MICARDIS) 80 MG tablet TAKE 1 TABLET BY MOUTH EVERY DAY 90 tablet 1  . [name] 100 MG TABS TAKE 1 TABLET(100 MG) BY MOUTH TWICE DAILY 180 tablet 1   No facility-administered medications prior to visit.    PAST MEDICAL HISTORY: Past Medical History:  Diagnosis Date  . Diabetes mellitus   . Gout   . Hyperlipemia   . Hypertension   . LVH (left ventricular hypertrophy)    12/21/09 Echo -mild asymmetric LVH,EF =>55%  . Morbid obesity ([place])   . OSA (obstructive sleep apnea)   . PAD (peripheral artery disease) ([place])   . Renal artery stenosis ([place]) 12/13/2010   PTCA and Stent - right  . Seizures ([place])   . Syncopal episodes    . Thrombocytosis ([place])     PAST SURGICAL HISTORY: Past Surgical History:  Procedure Laterality Date  . BREAST CYST EXCISION     S/P Benign Right Breast Cyst Removal.  . BREAST REDUCTION SURGERY  1995   S/P Bilateral breast reduction  . [place]   Times  Two.  . CORONARY ARTERY BYPASS GRAFT N/A 04/21/2017   Procedure: CORONARY ARTERY BYPASS GRAFTING (CABG) x 2, USING LEFT MAMMARY ARTERY AND RIGHT GREATER SAPHENOUS VEIN HARVESTED ENDOSCOPICALLY;  Surgeon: [name], MD;  Location: [facility];  Service: Open Heart Surgery;  Laterality: N/A;  . EXTREMITY CYST EXCISION  1993   left wrist  . LEFT HEART CATH AND CORONARY ANGIOGRAPHY N/A 04/17/2017   Procedure: Left Heart Cath and Coronary Angiography;  Surgeon: Jettie Booze, MD;  Location: [facility];  Service: Cardiovascular;  Laterality: N/A;  . PARTIAL HYSTERECTOMY  1994  . renal artery stent placement Right   . TEE WITHOUT CARDIOVERSION N/A 04/21/2017   Procedure: TRANSESOPHAGEAL ECHOCARDIOGRAM (TEE);  Surgeon: [name], MD;  Location: [facility];  Service: Open Heart Surgery;  Laterality: N/A;    FAMILY HISTORY: Family History  Problem Relation Age of Onset  . Heart failure Mother   . Diabetes Father   . Diabetes Sister   . Hypertension Sister   . Hypertension Sister   . Hypertension Sister   . Breast cancer Maternal Aunt        2 maternal aunts had breast cancer.  . Breast cancer Maternal Grandmother     SOCIAL HISTORY: Social History   Socioeconomic History  . Marital status: Married    Spouse name: Juanda Crumble  . Number of children: 2  . Years of education: 64  . Highest education level: Not on file  Occupational History  . Occupation: TRANSPORTATION COOR.    Employer: [employer]  Tobacco Use  . Smoking status: Never Smoker  . Smokeless tobacco: Never Used  Substance and Sexual Activity  . Alcohol use: No    Alcohol/week: 0.0 standard drinks  . Drug use: No  .  Sexual activity: Yes  Other Topics Concern  . Not on file  Social History Narrative   Patient lives at husband and her son, home with family.   Patient has two adult children.   Patient is not drinking any caffeine.   Patient is working full-time.   Patient has a college education.   Patient is right-handed.   Social Determinants of Health   Financial Resource Strain:   . Difficulty of Paying Living Expenses:   Food Insecurity:   . Worried About Charity fundraiser in the Last Year:   . Arboriculturist in the Last Year:   Transportation Needs:   . Film/video editor (Medical):   Marland Kitchen Lack of Transportation (Non-Medical):   Physical Activity:   . Days of Exercise per Week:   . Minutes of Exercise per Session:   Stress:   . Feeling of Stress :   Social Connections:   . Frequency of Communication with Friends and Family:   . Frequency of Social Gatherings with Friends and Family:   . Attends Religious Services:   . Active Member of Clubs or Organizations:   . Attends Archivist Meetings:   Marland Kitchen Marital Status:   Intimate Partner Violence:   . Fear of Current or Ex-Partner:   . Emotionally Abused:   Marland Kitchen Physically Abused:   . Sexually Abused:    PHYSICAL EXAM  Vitals:   03/02/20 1058  BP: (!) 156/78  Pulse: 68  Temp: 98.2 F (36.8 C)  Weight: 201 lb (91.2 kg)  Height: '5\' 1"'$  (1.549 m)   Body mass index is 37.98 kg/m.  Generalized: Well developed, in no acute distress   Neurological examination  Mentation: Alert oriented to time, place, history taking. Follows all commands speech and language fluent Cranial nerve II-XII: Pupils were equal round reactive to light. Extraocular movements were full, visual field were full on confrontational test. Facial sensation and  strength were normal.  Head turning and shoulder shrug  were normal and symmetric. Motor: The motor testing reveals 5 over 5 strength of all 4 extremities. Good symmetric motor tone is noted  throughout.  Sensory: Sensory testing is intact to soft touch on all 4 extremities. No evidence of extinction is noted.  Coordination: Cerebellar testing reveals good finger-nose-finger and heel-to-shin bilaterally.  Gait and station: Gait is normal.  Reflexes: Deep tendon reflexes are symmetric and normal bilaterally.   DIAGNOSTIC DATA (LABS, IMAGING, TESTING) - I reviewed patient records, labs, notes, testing and imaging myself where available.  Lab Results  Component Value Date   WBC 7.3 02/14/2020   HGB 14.3 02/14/2020   HCT 43.6 02/14/2020   MCV 89.7 02/14/2020   PLT 326 02/14/2020      Component Value Date/Time   NA 137 01/03/2020 1037   NA 139 09/02/2019 1140   NA 139 08/11/2017 1528   K 3.7 01/03/2020 1037   K 4.2 08/11/2017 1528   CL 100 01/03/2020 1037   CL 102 03/19/2013 1535   CO2 30 01/03/2020 1037   CO2 27 08/11/2017 1528   GLUCOSE 99 01/03/2020 1037   GLUCOSE 93 08/11/2017 1528   GLUCOSE 117 (H) 03/19/2013 1535   BUN 22 01/03/2020 1037   BUN 16 09/02/2019 1140   BUN 15.0 08/11/2017 1528   CREATININE 1.21 (H) 01/03/2020 1037   CREATININE 1.1 08/11/2017 1528   CALCIUM 10.0 02/25/2020 1200   CALCIUM 9.2 08/11/2017 1528   PROT 6.4 02/25/2020 1200   PROT 7.0 08/11/2017 1528   ALBUMIN 3.4 (L) 01/03/2020 1037   ALBUMIN 4.2 11/22/2018 1714   ALBUMIN 3.6 08/11/2017 1528   AST 23 01/03/2020 1037   AST 27 08/11/2017 1528   ALT 28 01/03/2020 1037   ALT 26 08/11/2017 1528   ALKPHOS 86 01/03/2020 1037   ALKPHOS 88 08/11/2017 1528   BILITOT 0.4 01/03/2020 1037   BILITOT 0.4 11/22/2018 1714   BILITOT 0.37 08/11/2017 1528   GFRNONAA 47 (L) 01/03/2020 1037   GFRAA 54 (L) 01/03/2020 1037   Lab Results  Component Value Date   CHOL 102 10/28/2019   HDL 43 10/28/2019   LDLCALC 47 10/28/2019   TRIG 46 10/28/2019   CHOLHDL 2.4 10/28/2019   Lab Results  Component Value Date   HGBA1C 6.3 (H) 02/25/2020   Lab Results  Component Value Date   VOZDGUYQ03 474  03/13/2019   Lab Results  Component Value Date   TSH 2.060 11/22/2018    ASSESSMENT AND PLAN 64 y.o. year old female  has a past medical history of Diabetes mellitus, Gout, Hyperlipemia, Hypertension, LVH (left ventricular hypertrophy), Morbid obesity ([place]), OSA (obstructive sleep apnea), PAD (peripheral artery disease) ([place]), Renal artery stenosis ([place]) (12/13/2010), Seizures ([place]), Syncopal episodes, and Thrombocytosis ([place]). here with:  1.  History of seizures 2.  Obstructive sleep apnea on CPAP  It is unclear if the episode of loud noise making/chewing while on CPAP is seizure related, hypoglycemic, or related to her CPAP equipment.  [name] level has been normal.  I did pull CPAP download, report indicates a high leak 48.5, I will order for a mask refit, this may be contributing to the episode she is describing.  We did discuss the importance of eating a snack before bed, if the episodes continue, her husband should wake her up, and check her blood sugar.  She will remain on [name] 100 mg twice a day, requires brand-name.  Once she receives her  mask refit, she should contact our office about 4-6 weeks after and we can pull a CPAP download to ensure improvement.  She will keep her follow-up appointment for CPAP compliance in September with Jinny Blossom, we can determine follow-up for seizure going forward from there.   I spent 30 minutes of face-to-face and non-face-to-face time with patient.  This included previsit chart review, lab review, study review, order entry, electronic health record documentation, patient education.  Butler Denmark, AGNP-C, DNP 03/02/2020, 11:57 AM [facility] [address], [place], [place] [address] [phone]

## 2020-03-24 ENCOUNTER — Encounter: Payer: Self-pay | Admitting: Hematology

## 2020-03-24 ENCOUNTER — Encounter: Payer: Self-pay | Admitting: Neurology

## 2020-03-24 ENCOUNTER — Encounter: Payer: Self-pay | Admitting: Adult Health

## 2020-03-24 ENCOUNTER — Encounter: Payer: Self-pay | Admitting: Internal Medicine

## 2020-03-26 ENCOUNTER — Encounter: Payer: Self-pay | Admitting: Internal Medicine

## 2020-03-26 ENCOUNTER — Encounter: Payer: Self-pay | Admitting: Neurology

## 2020-03-30 ENCOUNTER — Other Ambulatory Visit: Payer: Self-pay | Admitting: Neurology

## 2020-03-30 MED ORDER — VIMPAT 100 MG PO TABS: ORAL_TABLET | ORAL | 1 refills | Status: DC

## 2020-03-30 NOTE — Progress Notes (Signed)
She is going to increase [name] 100 mg am/150 mg pm due to possible nocturnal seizures. I sent a new rx to her pharmacy.

## 2020-04-02 NOTE — Progress Notes (Signed)
[facility]   [phone] [phone]   Clinic Follow up Note   Patient Care Team: [name], MD as PCP - General (Internal Medicine)  Date of Service:  04/03/2020  CHIEF COMPLAINT: follow up Essential Thrombocytosis   CURRENT THERAPY:  -Hydroxyurea 500 mg daily, started in 2010., increased to hydrea 1053m daily on Monday and Thursday, and 5091mdaily for the rest of week in 01/2015, Aspirin 81 mg daily.  -Switched Hydrea to anagrelide1m76mily10/2017. Held and was on hydrea from 01/2019-5/2020due to Anagrelide on back order.Increase Anagrelide to 1mg49mily in AM and on MWF add 0.5mg 21mthe PM starting 01/03/20.   INTERVAL HISTORY:  Sara LIPPOLDere for a follow up of ET. She presents to the clinic alone.  She is doing wlle overall. She is quite stressed lately due to her family member's illness.  Husband has spinal issues, sister had stroke recently.  She has been taking care of them at home.  She is on family leave, and plans to retire later this year.  She is tolerating anagrelide well, no noticeable side effects.  Her seizure medicine has been adjusted lately.  Review of system otherwise negative.   MEDICAL HISTORY:  Past Medical History:  Diagnosis Date  . Diabetes mellitus   . Gout   . Hyperlipemia   . Hypertension   . LVH (left ventricular hypertrophy)    12/21/09 Echo -mild asymmetric LVH,EF =>55%  . Morbid obesity (HCC) [place] OSA (obstructive sleep apnea)   . PAD (peripheral artery disease) (HCC) [place] Renal artery stenosis (HCC) [place]0/2012   PTCA and Stent - right  . Seizures (HCC) [place] Syncopal episodes   . Thrombocytosis (HCC) [place] SURGICAL HISTORY: Past Surgical History:  Procedure Laterality Date  . BREAST CYST EXCISION     S/P Benign Right Breast Cyst Removal.  . BREAST REDUCTION SURGERY  1995   S/P Bilateral breast reduction  . [place]  Two.  . CORONARY ARTERY BYPASS  GRAFT N/A 04/21/2017   Procedure: CORONARY ARTERY BYPASS GRAFTING (CABG) x 2, USING LEFT MAMMARY ARTERY AND RIGHT GREATER SAPHENOUS VEIN HARVESTED ENDOSCOPICALLY;  Surgeon: BartlGaye Pollack  [facility];  Laterality: N/A;  . EXTREMITY CYST EXCISION  1993   left wrist  . LEFT HEART CATH AND CORONARY ANGIOGRAPHY N/A 04/17/2017   Procedure: Left Heart Cath and Coronary Angiography;  Surgeon: VaranJettie Booze  [facility];  Service: Cardiovascular;  Laterality: N/A;  . PARTIAL HYSTERECTOMY  1994  . renal artery stent placement Right   . TEE WITHOUT CARDIOVERSION N/A 04/21/2017   Procedure: TRANSESOPHAGEAL ECHOCARDIOGRAM (TEE);  Surgeon: BartlGaye Pollack  [facility];  Laterality: N/A;    I have reviewed the social history and family history with the patient and they are unchanged from previous note.  ALLERGIES:  is allergic to sulfa antibiotics.  MEDICATIONS:  Current Outpatient Medications  Medication Sig Dispense Refill  . acetaminophen (TYLENOL) 325 MG tablet Take 2 tablets (650 mg total) by mouth every 6 (six) hours as needed for mild pain.    . anaMarland Kitchenrelide ([name]) 0.5 MG capsule Take 1 capsule (0.5 mg total) by mouth at bedtime. On Mondays, Wednesdays and Fridays 15 capsule 2  . anagrelide ([name]) 1 MG capsule Take 1 capsule (1 mg total) by mouth daily.  30 capsule 2  . atorvastatin (LIPITOR) 40 MG tablet TAKE 1 TABLET BY MOUTH DAILY AT 6 PM 90 tablet 3  . B Complex Vitamins (B COMPLEX PO) Take 1 tablet by mouth daily.     . Blood Glucose Monitoring Suppl (ONETOUCH VERIO IQ SYSTEM) w/Device KIT Use as directed to check blood sugars 1 time per day dx: e11.22 1 kit 1  . Blood Pressure Monitoring (BLOOD PRESSURE CUFF) MISC Check blood pressure 1-2 times a day 3 h after taking BP medication.  Dx- CAD        HTN. 1 each 0  . Cholecalciferol (VITAMIN D3) 2000 UNITS capsule Take 2,000 Units by  mouth daily.     Marland Kitchen escitalopram (LEXAPRO) 10 MG tablet 10 mg daily. Pt states as needed    . glucose blood (ONETOUCH VERIO) test strip Use as instructed to check blood sugars 1 time per day dx: e11.22 150 each 2  . hydrochlorothiazide (MICROZIDE) 12.5 MG capsule TAKE 1 CAPSULE(12.5 MG) BY MOUTH DAILY 90 capsule 1  . metoprolol tartrate (LOPRESSOR) 50 MG tablet TAKE 1 AND 1/2 TABLETS BY MOUTH TWICE A DAY 90 tablet 3  . OneTouch Delica Lancets 26Z MISC Use as directed to check blood sugars 1 time per day dx: e11.22 150 each 2  . telmisartan (MICARDIS) 80 MG tablet TAKE 1 TABLET BY MOUTH EVERY DAY 90 tablet 1  . [name] 100 MG TABS Take 1 tablet in the morning, take 1.5 tablets in the evening 210 tablet 1   No current facility-administered medications for this visit.    PHYSICAL EXAMINATION: ECOG PERFORMANCE STATUS: 1 - Symptomatic but completely ambulatory  Vitals:   04/03/20 1314  BP: (!) 159/68  Pulse: 64  Resp: 18  Temp: 97.7 F (36.5 C)  SpO2: 100%   Filed Weights   04/03/20 1314  Weight: 194 lb 1.6 oz (88 kg)    GENERAL:alert, no distress and comfortable SKIN: skin color, texture, turgor are normal, no rashes or significant lesions EYES: normal, Conjunctiva are pink and non-injected, sclera clear NECK: supple, thyroid normal size, non-tender, without nodularity LYMPH:  no palpable lymphadenopathy in the cervical, axillary  LUNGS: clear to auscultation and percussion with normal breathing effort HEART: regular rate & rhythm and no murmurs and no lower extremity edema ABDOMEN:abdomen soft, non-tender and normal bowel sounds Musculoskeletal:no cyanosis of digits and no clubbing  NEURO: alert & oriented x 3 with fluent speech, no focal motor/sensory deficits  LABORATORY DATA:  I have reviewed the data as listed CBC Latest Ref Rng & Units 04/03/2020 02/14/2020 01/03/2020  WBC 4.0 - 10.5 K/uL 8.5 7.3 7.8  Hemoglobin 12.0 - 15.0 g/dL 13.3 14.3 13.7  Hematocrit 36 - 46 % 40.3 43.6  42.1  Platelets 150 - 400 K/uL 265 326 491(H)     CMP Latest Ref Rng & Units 02/25/2020 01/03/2020 11/15/2019  Glucose 70 - 99 mg/dL - 99 113(H)  BUN 8 - 23 mg/dL - 22 21  Creatinine 0.44 - 1.00 mg/dL - 1.21(H) 1.28(H)  Sodium 135 - 145 mmol/L - 137 142  Potassium 3.5 - 5.1 mmol/L - 3.7 3.7  Chloride 98 - 111 mmol/L - 100 105  CO2 22 - 32 mmol/L - 30 28  Calcium 8.7 - 10.3 mg/dL 10.0 9.5 9.2  Total Protein 6.0 - 8.5 g/dL 6.4 6.6 6.5  Total Bilirubin 0.3 - 1.2 mg/dL - 0.4 0.4  Alkaline Phos 38 - 126 U/L - 86 80  AST 15 - 41 U/L -  23 20  ALT 0 - 44 U/L - 28 20      RADIOGRAPHIC STUDIES: I have personally reviewed the radiological images as listed and agreed with the findings in the report. No results found.   ASSESSMENT & PLAN:  Sara [name] is a 64 y.o. female with    1. Essential Thrombocythemia. JAK2 (+) -Diagnosed in 2010. JAK2 mutation was present.Herbone marrow aspirate and biopsy on 01/20/2009 which yielded a specimen that was suboptimal for evaluation. However, megakaryocytes were abundant with normal morphology. There was no clustering or excess blasts seen. Platelet count on 01/20/2009 was 881. Vernia Buff on Hydrea previously, had episodes of dizziness and syncope, and she was concerned about the risk of leukemia, so it was stopped. The patient is being treated with aspirin and Anagrelide 36m in AM and on MWF add 0.54min the PM   -Today's lab reviewed, CBC normal, platelets 265. -Will continue Anagrelide to 42m9mn AM and on MWF add 0.5mg72m the PM   -Lab in 3 months and follow-up in 6 months   2.H/o ofSeizure,Recurrent Syncope episodes -Her Syncope have unclear etiology. -Shehas a heart monitor on now for evaluation and latest report showed sinus tachy SVT was faster than normal for 24 seconds on 10/02/19.  -I discussed this syncope can still be related to her Seizure. -continue meds, follow up with her neurologist.  4. Diabetes, HTN -She has been seeing a  [facility]ist [facility]. -She monitors her sugar level regularly -continue meds and f/uwith PCP  5. Coronary artery disease -Coronary artery disease status post bypass surgery grafting x 2 by Dr. BartCyndia BentJune 2018. -She completed cardiac rehab   PLAN: -Labs reviewed, CBC is normal. -Continue anagrelide 1 mgdaily in the AM and on MWF add 0.5mg 65mthe PM -lab in 3 months  -Lab and f/u with APP in 6mont30month problem-specific Assessment & Plan notes found for this encounter.   No orders of the defined types were placed in this encounter.  All questions were answered. The patient knows to call the clinic with any problems, questions or concerns. No barriers to learning was detected. The total time spent in the appointment was 25 minutes.     Destyn Parfitt FeTruitt Merle/08/2020   I, Amoya Joslyn Devoncting as scribe for Lujain Kraszewski FeTruitt Merle  I have reviewed the above documentation for accuracy and completeness, and I agree with the above.

## 2020-04-03 ENCOUNTER — Other Ambulatory Visit: Payer: Self-pay

## 2020-04-03 ENCOUNTER — Inpatient Hospital Stay: Payer: BC Managed Care – PPO | Attending: Hematology

## 2020-04-03 ENCOUNTER — Encounter: Payer: Self-pay | Admitting: Hematology

## 2020-04-03 ENCOUNTER — Encounter: Payer: Self-pay | Admitting: Neurology

## 2020-04-03 ENCOUNTER — Telehealth: Payer: Self-pay | Admitting: Hematology

## 2020-04-03 ENCOUNTER — Inpatient Hospital Stay: Payer: BC Managed Care – PPO | Admitting: Hematology

## 2020-04-03 VITALS — BP 159/68 | HR 64 | Temp 97.7°F | Resp 18 | Ht 61.0 in | Wt 194.1 lb

## 2020-04-03 DIAGNOSIS — E785 Hyperlipidemia, unspecified: Secondary | ICD-10-CM | POA: Insufficient documentation

## 2020-04-03 DIAGNOSIS — I2510 Atherosclerotic heart disease of native coronary artery without angina pectoris: Secondary | ICD-10-CM | POA: Diagnosis not present

## 2020-04-03 DIAGNOSIS — E119 Type 2 diabetes mellitus without complications: Secondary | ICD-10-CM | POA: Insufficient documentation

## 2020-04-03 DIAGNOSIS — D473 Essential (hemorrhagic) thrombocythemia: Secondary | ICD-10-CM | POA: Insufficient documentation

## 2020-04-03 DIAGNOSIS — Z79899 Other long term (current) drug therapy: Secondary | ICD-10-CM | POA: Insufficient documentation

## 2020-04-03 DIAGNOSIS — I119 Hypertensive heart disease without heart failure: Secondary | ICD-10-CM | POA: Diagnosis not present

## 2020-04-03 DIAGNOSIS — Z7982 Long term (current) use of aspirin: Secondary | ICD-10-CM | POA: Diagnosis not present

## 2020-04-03 DIAGNOSIS — Z0000 Encounter for general adult medical examination without abnormal findings: Secondary | ICD-10-CM

## 2020-04-03 DIAGNOSIS — G4733 Obstructive sleep apnea (adult) (pediatric): Secondary | ICD-10-CM | POA: Diagnosis not present

## 2020-04-03 LAB — CBC WITH DIFFERENTIAL/PLATELET
Abs Immature Granulocytes: 0.04 10*3/uL (ref 0.00–0.07)
Basophils Absolute: 0.1 10*3/uL (ref 0.0–0.1)
Basophils Relative: 1 %
Eosinophils Absolute: 0.3 10*3/uL (ref 0.0–0.5)
Eosinophils Relative: 3 %
HCT: 40.3 % (ref 36.0–46.0)
Hemoglobin: 13.3 g/dL (ref 12.0–15.0)
Immature Granulocytes: 1 %
Lymphocytes Relative: 23 %
Lymphs Abs: 1.9 10*3/uL (ref 0.7–4.0)
MCH: 29.6 pg (ref 26.0–34.0)
MCHC: 33 g/dL (ref 30.0–36.0)
MCV: 89.6 fL (ref 80.0–100.0)
Monocytes Absolute: 0.6 10*3/uL (ref 0.1–1.0)
Monocytes Relative: 7 %
Neutro Abs: 5.6 10*3/uL (ref 1.7–7.7)
Neutrophils Relative %: 65 %
Platelets: 265 10*3/uL (ref 150–400)
RBC: 4.5 MIL/uL (ref 3.87–5.11)
RDW: 13.9 % (ref 11.5–15.5)
WBC: 8.5 10*3/uL (ref 4.0–10.5)
nRBC: 0 % (ref 0.0–0.2)

## 2020-04-03 MED ORDER — ANAGRELIDE HCL 1 MG PO CAPS: 1.0000 mg | ORAL_CAPSULE | Freq: Every day | ORAL | 5 refills | Status: DC

## 2020-04-03 MED ORDER — ANAGRELIDE HCL 0.5 MG PO CAPS: 0.5000 mg | ORAL_CAPSULE | Freq: Every evening | ORAL | 5 refills | Status: DC

## 2020-04-03 NOTE — Telephone Encounter (Signed)
Scheduled appt per 6/11 los.  Pt declined avs and calendar

## 2020-04-05 ENCOUNTER — Encounter: Payer: Self-pay | Admitting: Hematology

## 2020-04-06 ENCOUNTER — Encounter: Payer: Self-pay | Admitting: Hematology

## 2020-04-06 NOTE — Progress Notes (Signed)
Patient returned my call.  Advised her after contacting [facility] of the correct pricing. Patient verbalized understanding and was very appreciative. She didn't express any additional concerns.

## 2020-04-06 NOTE — Progress Notes (Signed)
Called patient and left message with spouse to return my call regarding prescription cost. Left my contact name and number.

## 2020-04-14 LAB — HM DIABETES EYE EXAM

## 2020-04-17 ENCOUNTER — Other Ambulatory Visit: Payer: Self-pay | Admitting: Hematology

## 2020-04-17 DIAGNOSIS — D473 Essential (hemorrhagic) thrombocythemia: Secondary | ICD-10-CM

## 2020-04-24 LAB — HM MAMMOGRAPHY

## 2020-04-28 ENCOUNTER — Encounter: Payer: Self-pay | Admitting: Internal Medicine

## 2020-05-06 ENCOUNTER — Other Ambulatory Visit: Payer: Self-pay

## 2020-05-06 MED ORDER — METOPROLOL TARTRATE 50 MG PO TABS: ORAL_TABLET | ORAL | 0 refills | Status: DC

## 2020-05-15 ENCOUNTER — Other Ambulatory Visit: Payer: Self-pay

## 2020-05-15 ENCOUNTER — Ambulatory Visit (INDEPENDENT_AMBULATORY_CARE_PROVIDER_SITE_OTHER): Payer: BC Managed Care – PPO | Admitting: Cardiovascular Disease

## 2020-05-15 ENCOUNTER — Encounter: Payer: Self-pay | Admitting: Cardiovascular Disease

## 2020-05-15 VITALS — BP 152/88 | HR 74 | Ht 62.0 in | Wt 194.2 lb

## 2020-05-15 DIAGNOSIS — I2510 Atherosclerotic heart disease of native coronary artery without angina pectoris: Secondary | ICD-10-CM | POA: Diagnosis not present

## 2020-05-15 DIAGNOSIS — Z8679 Personal history of other diseases of the circulatory system: Secondary | ICD-10-CM

## 2020-05-15 DIAGNOSIS — N182 Chronic kidney disease, stage 2 (mild): Secondary | ICD-10-CM

## 2020-05-15 DIAGNOSIS — I5032 Chronic diastolic (congestive) heart failure: Secondary | ICD-10-CM

## 2020-05-15 DIAGNOSIS — G4733 Obstructive sleep apnea (adult) (pediatric): Secondary | ICD-10-CM | POA: Diagnosis not present

## 2020-05-15 DIAGNOSIS — D473 Essential (hemorrhagic) thrombocythemia: Secondary | ICD-10-CM

## 2020-05-15 DIAGNOSIS — E7800 Pure hypercholesterolemia, unspecified: Secondary | ICD-10-CM

## 2020-05-15 DIAGNOSIS — E119 Type 2 diabetes mellitus without complications: Secondary | ICD-10-CM

## 2020-05-15 DIAGNOSIS — I10 Essential (primary) hypertension: Secondary | ICD-10-CM | POA: Diagnosis not present

## 2020-05-15 NOTE — Patient Instructions (Signed)
Medication Instructions:  No changes *If you need a refill on your cardiac medications before your next appointment, please call your pharmacy*   Lab Work: None ordered If you have labs (blood work) drawn today and your tests are completely normal, you will receive your results only by: Marland Kitchen MyChart Message (if you have MyChart) OR . A paper copy in the mail If you have any lab test that is abnormal or we need to change your treatment, we will call you to review the results.   Testing/Procedures: None ordered   Follow-Up: At [facility], you and your health needs are our priority.  As part of our continuing mission to provide you with exceptional heart care, we have created designated Provider Care Teams.  These Care Teams include your primary Cardiologist (physician) and Advanced Practice Providers (APPs -  Physician Assistants and Nurse Practitioners) who all work together to provide you with the care you need, when you need it.  We recommend signing up for the patient portal called "MyChart".  Sign up information is provided on this After Visit Summary.  MyChart is used to connect with patients for Virtual Visits (Telemedicine).  Patients are able to view lab/test results, encounter notes, upcoming appointments, etc.  Non-urgent messages can be sent to your provider as well.   To learn more about what you can do with MyChart, go to [email or web address].    Your next appointment:   12 month(s)  The format for your next appointment:   In Person  Provider:   You may see Dr. Sallyanne Kuster or one of the following Advanced Practice Providers on your designated Care Team:    Almyra Deforest, PA-C  Fabian Sharp, [place] or   Roby Lofts, [place]    Other Instructions

## 2020-05-15 NOTE — Progress Notes (Signed)
 Cardiology Office Note   Date:  05/15/2020   ID:  Sara [name], DOB 12/09/1955, MRN [id number] PCP:  Sanders, Robyn, MD  Cardiologist:   Electrophysiologist:  None   Evaluation Performed:  Follow-Up Visit  Chief Complaint:  CAD  History of Present Illness:    Sara [name] is a 63 y.o. female with history of artery disease (NSTEMI April 15, 2017, LIMA to LAD and SVG to diagonal bypass surgery on April 21, 2017).  Mild ischemic cardiomyopathy (EF 45-50%), peripheral arterial disease (renal artery stenosis with stent 2012), morbid obesity, diabetes mellitus, hyperlipidemia, hypertension, obstructive sleep apnea on CPAP, essential thrombocythemia and absence seizures, returning for routine follow-up.  The patient specifically denies any chest pain at rest exertion, dyspnea at rest or with exertion, orthopnea, paroxysmal nocturnal dyspnea, syncope, palpitations, focal neurological deficits, intermittent claudication, lower extremity edema, unexplained weight gain, cough, hemoptysis or wheezing.  Working hard on losing some of the weight that she gained during the COVID-19 pandemic.  Has lost 7 pounds since May.  Hemoglobin A1c is 6.3% without taking any diabetes medications, most recent LDL cholesterol was excellent at 47.  She is very busy taking care of her sister who had this a disabling stroke, her son who has paranoid schizophrenia and her husband who has lumbar spine disease and limited mobility.  She is planning to retire from [place] schools on October 1.  Past Medical History:  Diagnosis Date  . Diabetes mellitus   . Gout   . Hyperlipemia   . Hypertension   . LVH (left ventricular hypertrophy)    12/21/09 Echo -mild asymmetric LVH,EF =>55%  . Morbid obesity (HCC)   . OSA (obstructive sleep apnea)   . PAD (peripheral artery disease) (HCC)   . Renal artery stenosis (HCC) 12/13/2010   PTCA and Stent - right  . Seizures (HCC)   . Syncopal episodes   .  Thrombocytosis (HCC)    Past Surgical History:  Procedure Laterality Date  . BREAST CYST EXCISION     S/P Benign Right Breast Cyst Removal.  . BREAST REDUCTION SURGERY  1995   S/P Bilateral breast reduction  . CESAREAN SECTION  1980, 1986   Times  Two.  . CORONARY ARTERY BYPASS GRAFT N/A 04/21/2017   Procedure: CORONARY ARTERY BYPASS GRAFTING (CABG) x 2, USING LEFT MAMMARY ARTERY AND RIGHT GREATER SAPHENOUS VEIN HARVESTED ENDOSCOPICALLY;  Surgeon: Bartle, Bryan K, MD;  Location: [facility] OR;  Service: Open Heart Surgery;  Laterality: N/A;  . EXTREMITY CYST EXCISION  1993   left wrist  . LEFT HEART CATH AND CORONARY ANGIOGRAPHY N/A 04/17/2017   Procedure: Left Heart Cath and Coronary Angiography;  Surgeon: Varanasi, Jayadeep S, MD;  Location: [facility] INVASIVE CV LAB;  Service: Cardiovascular;  Laterality: N/A;  . PARTIAL HYSTERECTOMY  1994  . renal artery stent placement Right   . TEE WITHOUT CARDIOVERSION N/A 04/21/2017   Procedure: TRANSESOPHAGEAL ECHOCARDIOGRAM (TEE);  Surgeon: Bartle, Bryan K, MD;  Location: [facility] OR;  Service: Open Heart Surgery;  Laterality: N/A;     Current Meds  Medication Sig  . acetaminophen (TYLENOL) 325 MG tablet Take 2 tablets (650 mg total) by mouth every 6 (six) hours as needed for mild pain.  . anagrelide ([name]) 0.5 MG capsule Take 1 capsule (0.5 mg total) by mouth at bedtime. On Mondays, Wednesdays and Fridays  . anagrelide ([name]) 1 MG capsule Take 1 capsule (1 mg total) by mouth daily.  . atorvastatin (LIPITOR) 40 MG tablet TAKE   1 TABLET BY MOUTH DAILY AT 6 PM  . B Complex Vitamins (B COMPLEX PO) Take 1 tablet by mouth daily.   . Blood Glucose Monitoring Suppl (ONETOUCH VERIO IQ SYSTEM) w/Device KIT Use as directed to check blood sugars 1 time per day dx: e11.22  . Blood Pressure Monitoring (BLOOD PRESSURE CUFF) MISC Check blood pressure 1-2 times a day 3 h after taking BP medication.  Dx- CAD        HTN.  . Cholecalciferol (VITAMIN D3) 2000 UNITS capsule Take  2,000 Units by mouth daily.   . escitalopram (LEXAPRO) 10 MG tablet 10 mg daily. Pt states as needed  . glucose blood (ONETOUCH VERIO) test strip Use as instructed to check blood sugars 1 time per day dx: e11.22  . hydrochlorothiazide (MICROZIDE) 12.5 MG capsule TAKE 1 CAPSULE(12.5 MG) BY MOUTH DAILY  . metoprolol tartrate (LOPRESSOR) 50 MG tablet TAKE 1 AND 1/2 TABLETS BY MOUTH TWICE A DAY  . OneTouch Delica Lancets 33G MISC Use as directed to check blood sugars 1 time per day dx: e11.22  . Specialty Vitamins Products (MAGNESIUM, AMINO ACID CHELATE,) 133 MG tablet Take 1 tablet by mouth 2 (two) times daily.  . telmisartan (MICARDIS) 80 MG tablet TAKE 1 TABLET BY MOUTH EVERY DAY  . [name] 100 MG TABS Take 1 tablet in the morning, take 1.5 tablets in the evening     Allergies:   Sulfa antibiotics   Social History   Tobacco Use  . Smoking status: Never Smoker  . Smokeless tobacco: Never Used  Vaping Use  . Vaping Use: Never used  Substance Use Topics  . Alcohol use: No    Alcohol/week: 0.0 standard drinks  . Drug use: No     Family Hx: The patient's family history includes Breast cancer in her maternal aunt and maternal grandmother; Diabetes in her father and sister; Heart failure in her mother; Hypertension in her sister, sister, and sister.  ROS:   Please see the history of present illness.     All other systems reviewed and are negative.   Prior CV studies:   The following studies were reviewed today:  Labs/Other Tests and Data Reviewed:    EKG: ECG ordered today shows normal sinus rhythm, QS pattern in leads V1-V2, no ischemic repolarization abnormalities, QTC 421 ms  Recent Labs: 01/03/2020: ALT 28; BUN 22; Creatinine, Ser 1.21; Potassium 3.7; Sodium 137 04/03/2020: Hemoglobin 13.3; Platelets 265   Recent Lipid Panel Lab Results  Component Value Date/Time   CHOL 102 10/28/2019 11:54 AM   TRIG 46 10/28/2019 11:54 AM   HDL 43 10/28/2019 11:54 AM   CHOLHDL 2.4  10/28/2019 11:54 AM   CHOLHDL 2.7 04/17/2017 10:00 AM   LDLCALC 47 10/28/2019 11:54 AM    Wt Readings from Last 3 Encounters:  05/15/20 194 lb 3.2 oz (88.1 kg)  04/03/20 194 lb 1.6 oz (88 kg)  03/02/20 201 lb (91.2 kg)     Objective:    Vital Signs:  BP (!) 152/88   Pulse 74   Ht 5' 2" (1.575 m)   Wt 194 lb 3.2 oz (88.1 kg)   SpO2 100%   BMI 35.52 kg/m     General: Alert, oriented x3, no distress, Moderately obese Head: no evidence of trauma, PERRL, EOMI, no exophtalmos or lid lag, no myxedema, no xanthelasma; normal ears, nose and oropharynx Neck: normal jugular venous pulsations and no hepatojugular reflux; brisk carotid pulses without delay and no carotid bruits Chest: clear to   auscultation, no signs of consolidation by percussion or palpation, normal fremitus, symmetrical and full respiratory excursions.  Healed sternotomy scar Cardiovascular: normal position and quality of the apical impulse, regular rhythm, normal first and second heart sounds, no murmurs, rubs or gallops Abdomen: no tenderness or distention, no masses by palpation, no abnormal pulsatility or arterial bruits, normal bowel sounds, no hepatosplenomegaly Extremities: no clubbing, cyanosis or edema; 2+ radial, ulnar and brachial pulses bilaterally; 2+ right femoral, posterior tibial and dorsalis pedis pulses; 2+ left femoral, posterior tibial and dorsalis pedis pulses; no subclavian or femoral bruits Neurological: grossly nonfocal Psych: Normal mood and affect   ASSESSMENT & PLAN:    1. Coronary artery disease involving native coronary artery of native heart without angina pectoris   2. Chronic diastolic congestive heart failure (HCC)   3. Essential hypertension   4. OSA (obstructive sleep apnea)   5. Type 2 diabetes mellitus without complication, without long-term current use of insulin (HCC)   6. Hypercholesterolemia   7. Severe obesity (BMI 35.0-39.9) with comorbidity (HCC)   8. History of renal artery  stenosis   9. CKD (chronic kidney disease) stage 2, GFR 60-89 ml/min   10. Essential thrombocythemia (HCC)      1. CAD: Denies angina pectoris despite being very active. 2. CHF: Clinically euvolemic, NYHA functional class I. 3. HTN: Mildly elevated blood pressure today.  Target less than 130/80 medications not changed.  Reminded of the need for sodium restriction.  Encouraged in efforts to lose weight. 4. OSA: Reports compliance with CPAP (followed by Dr. Willis at [facility]).  Denies daytime hypersomnolence. 5. DM: Hemoglobin A1c 6.3% without any medications. 6. HLP: All lipid parameters at target on statin. 7. Obesity: Encouraged her with her efforts at weight loss.  I believe this will bring her blood pressure back to normal range. 8. Hx of renal artery stent: Consider repeat ultrasound evaluation if she develops worsening hypertension and/or worsening renal function.  Creatinine stable at 1.2. 9. CKD 2: Over the last 12 months creatinine has actually improved from 1.35-1.2 10.  Essential thrombocythemia: No bleeding or clotting complications.  On anagrelide and hydroxyurea.  Most recent platelet count normal at 265,000.   Medication Adjustments/Labs and Tests Ordered: Current medicines are reviewed at length with the patient today.  Concerns regarding medicines are outlined above.   Tests Ordered: Orders Placed This Encounter  Procedures  . EKG 12-Lead    Medication Changes: No orders of the defined types were placed in this encounter.  Patient Instructions  Medication Instructions:  No changes *If you need a refill on your cardiac medications before your next appointment, please call your pharmacy*   Lab Work: None ordered If you have labs (blood work) drawn today and your tests are completely normal, you will receive your results only by: . MyChart Message (if you have MyChart) OR . A paper copy in the mail If you have any lab test that is abnormal or we need  to change your treatment, we will call you to review the results.   Testing/Procedures: None ordered   Follow-Up: At [facility], you and your health needs are our priority.  As part of our continuing mission to provide you with exceptional heart care, we have created designated Provider Care Teams.  These Care Teams include your primary Cardiologist (physician) and Advanced Practice Providers (APPs -  Physician Assistants and Nurse Practitioners) who all work together to provide you with the care you need, when you need it.    We recommend signing up for the patient portal called "MyChart".  Sign up information is provided on this After Visit Summary.  MyChart is used to connect with patients for Virtual Visits (Telemedicine).  Patients are able to view lab/test results, encounter notes, upcoming appointments, etc.  Non-urgent messages can be sent to your provider as well.   To learn more about what you can do with MyChart, go to [email or web address].    Your next appointment:   12 month(s)  The format for your next appointment:   In Person  Provider:   You may see Dr.  or one of the following Advanced Practice Providers on your designated Care Team:    Hao Meng, PA-C  Angela Duke, PA-C or   Krista Kroeger, PA-C    Other Instructions    Follow Up:  Virtual Visit or In Person 1 year  Signed,  , MD  05/15/2020 10:33 AM    [facility] 

## 2020-05-17 ENCOUNTER — Encounter: Payer: Self-pay | Admitting: Internal Medicine

## 2020-05-17 ENCOUNTER — Encounter: Payer: Self-pay | Admitting: Hematology

## 2020-05-18 ENCOUNTER — Other Ambulatory Visit: Payer: Self-pay

## 2020-05-18 MED ORDER — ASPIRIN EC 81 MG PO TBEC: 81.0000 mg | DELAYED_RELEASE_TABLET | Freq: Every day | ORAL | 2 refills | Status: AC

## 2020-05-22 ENCOUNTER — Other Ambulatory Visit: Payer: Self-pay | Admitting: Internal Medicine

## 2020-05-22 ENCOUNTER — Other Ambulatory Visit: Payer: Self-pay | Admitting: Cardiovascular Disease

## 2020-05-31 ENCOUNTER — Other Ambulatory Visit: Payer: Self-pay | Admitting: Cardiovascular Disease

## 2020-06-07 ENCOUNTER — Other Ambulatory Visit: Payer: Self-pay | Admitting: Internal Medicine

## 2020-06-30 ENCOUNTER — Other Ambulatory Visit: Payer: Self-pay

## 2020-06-30 ENCOUNTER — Ambulatory Visit (INDEPENDENT_AMBULATORY_CARE_PROVIDER_SITE_OTHER): Payer: BC Managed Care – PPO | Admitting: Internal Medicine

## 2020-06-30 ENCOUNTER — Other Ambulatory Visit: Payer: Self-pay | Admitting: Internal Medicine

## 2020-06-30 ENCOUNTER — Encounter: Payer: Self-pay | Admitting: Internal Medicine

## 2020-06-30 VITALS — BP 142/86 | HR 73 | Temp 98.0°F | Ht 62.0 in | Wt 189.2 lb

## 2020-06-30 DIAGNOSIS — I2510 Atherosclerotic heart disease of native coronary artery without angina pectoris: Secondary | ICD-10-CM

## 2020-06-30 DIAGNOSIS — Z6834 Body mass index (BMI) 34.0-34.9, adult: Secondary | ICD-10-CM

## 2020-06-30 DIAGNOSIS — E7800 Pure hypercholesterolemia, unspecified: Secondary | ICD-10-CM | POA: Diagnosis not present

## 2020-06-30 DIAGNOSIS — E1122 Type 2 diabetes mellitus with diabetic chronic kidney disease: Secondary | ICD-10-CM

## 2020-06-30 DIAGNOSIS — E6609 Other obesity due to excess calories: Secondary | ICD-10-CM

## 2020-06-30 DIAGNOSIS — N1831 Chronic kidney disease, stage 3a: Secondary | ICD-10-CM

## 2020-06-30 DIAGNOSIS — E1121 Type 2 diabetes mellitus with diabetic nephropathy: Secondary | ICD-10-CM

## 2020-06-30 DIAGNOSIS — I1310 Hypertensive heart and chronic kidney disease without heart failure, with stage 1 through stage 4 chronic kidney disease, or unspecified chronic kidney disease: Secondary | ICD-10-CM | POA: Diagnosis not present

## 2020-06-30 NOTE — Progress Notes (Signed)
I,[name],acting as a Education administrator for [name], MD.,have documented all relevant documentation on the behalf of [name], MD,as directed by  [name], MD while in the presence of [name], MD.  This visit occurred during the SARS-CoV-2 public health emergency.  Safety protocols were in place, including screening questions prior to the visit, additional usage of staff PPE, and extensive cleaning of exam room while observing appropriate contact time as indicated for disinfecting solutions.  Subjective:     Patient ID: Sara [name] , female    DOB: 02-04-1956 , 64 y.o.   MRN: [id number]   Chief Complaint  Patient presents with  . Diabetes  . Hypertension    HPI  She is here today for diabetes, blood pressure, and cholesterol follow-up,  Patient seen outside b/c she reported having recent COVID test. Results unknown. However, she is not having symptoms - she has regular tests performed by a family member.   Diabetes She presents for her follow-up diabetic visit. She has type 2 diabetes mellitus. There are no hypoglycemic associated symptoms. Pertinent negatives for diabetes include no blurred vision and no chest pain. There are no hypoglycemic complications. Diabetic complications include heart disease. Risk factors for coronary artery disease include dyslipidemia, diabetes mellitus, hypertension, obesity, post-menopausal and sedentary lifestyle. She is compliant with treatment most of the time. She is following a diabetic diet. She never participates in exercise. An ACE inhibitor/angiotensin II receptor blocker is being taken. Eye exam is not current.  Hypertension This is a chronic problem. The current episode started more than 1 year ago. The problem has been gradually improving since onset. The problem is uncontrolled. Pertinent negatives include no blurred vision, chest pain, palpitations or shortness of breath. The current treatment provides moderate  improvement. Compliance problems include exercise.  Hypertensive end-organ damage includes CAD/MI.     Past Medical History:  Diagnosis Date  . Diabetes mellitus   . Gout   . Hyperlipemia   . Hypertension   . LVH (left ventricular hypertrophy)    12/21/09 Echo -mild asymmetric LVH,EF =>55%  . Morbid obesity ([place])   . OSA (obstructive sleep apnea)   . PAD (peripheral artery disease) ([place])   . Renal artery stenosis ([place]) 12/13/2010   PTCA and Stent - right  . Seizures ([place])   . Syncopal episodes   . Thrombocytosis (HCC)      Family History  Problem Relation Age of Onset  . Heart failure Mother   . Diabetes Father   . Diabetes Sister   . Hypertension Sister   . Hypertension Sister   . Hypertension Sister   . Breast cancer Maternal Aunt        2 maternal aunts had breast cancer.  . Breast cancer Maternal Grandmother      Current Outpatient Medications:  .  acetaminophen (TYLENOL) 325 MG tablet, Take 2 tablets (650 mg total) by mouth every 6 (six) hours as needed for mild pain., Disp: , Rfl:  .  anagrelide ([name]) 0.5 MG capsule, Take 1 capsule (0.5 mg total) by mouth at bedtime. On Mondays, Wednesdays and Fridays (Patient taking differently: Take 0.5 mg by mouth at bedtime. 1 and 1/2 tab On Mondays, Wednesdays and Fridays), Disp: 15 capsule, Rfl: 5 .  anagrelide ([name]) 1 MG capsule, Take 1 capsule (1 mg total) by mouth daily., Disp: 30 capsule, Rfl: 5 .  aspirin EC 81 MG tablet, Take 1 tablet (81 mg total) by mouth daily. Swallow whole. (  Patient taking differently: Take 81 mg by mouth daily. 2 tabs at bedtime), Disp: 150 tablet, Rfl: 2 .  atorvastatin (LIPITOR) 40 MG tablet, TAKE 1 TABLET BY MOUTH DAILY AT 6 PM, Disp: 90 tablet, Rfl: 3 .  B Complex Vitamins (B COMPLEX PO), Take 1 tablet by mouth daily. , Disp: , Rfl:  .  Blood Glucose Monitoring Suppl (ONETOUCH VERIO IQ SYSTEM) w/Device KIT, Use as directed to check blood sugars 1 time per day dx: e11.22, Disp: 1 kit, Rfl:  1 .  Blood Pressure Monitoring (BLOOD PRESSURE CUFF) MISC, Check blood pressure 1-2 times a day 3 h after taking BP medication.  Dx- CAD        HTN., Disp: 1 each, Rfl: 0 .  Cholecalciferol (VITAMIN D3) 2000 UNITS capsule, Take 2,000 Units by mouth daily. , Disp: , Rfl:  .  escitalopram (LEXAPRO) 10 MG tablet, 10 mg daily. Pt states as needed, Disp: , Rfl:  .  glucose blood (ONETOUCH VERIO) test strip, USE AS DIRECTED TO TEST BLOOD SUGR LEVELS DAILY, Disp: 150 strip, Rfl: 3 .  hydrochlorothiazide (MICROZIDE) 12.5 MG capsule, TAKE 1 CAPSULE(12.5 MG) BY MOUTH DAILY, Disp: 90 capsule, Rfl: 3 .  metoprolol tartrate (LOPRESSOR) 50 MG tablet, TAKE 1 AND 1/2 TABLETS BY MOUTH TWICE DAILY, Disp: 45 tablet, Rfl: 2 .  OneTouch Delica Lancets 71I MISC, USE AS DIRECTED TO CHECK BLOOD SUGAR LEVELS ONCE DAILY, Disp: 200 each, Rfl: 2 .  Specialty Vitamins Products (MAGNESIUM, AMINO ACID CHELATE,) 133 MG tablet, Take 1 tablet by mouth 2 (two) times daily., Disp: , Rfl:  .  [name] 100 MG TABS, Take 1 tablet in the morning, take 1.5 tablets in the evening, Disp: 210 tablet, Rfl: 1 .  telmisartan (MICARDIS) 80 MG tablet, TAKE 1 TABLET BY MOUTH EVERY DAY, Disp: 90 tablet, Rfl: 1   Allergies  Allergen Reactions  . Sulfa Antibiotics Itching     Review of Systems  Constitutional: Negative.   Eyes: Negative for blurred vision.  Respiratory: Negative.  Negative for shortness of breath.   Cardiovascular: Negative.  Negative for chest pain and palpitations.  Gastrointestinal: Negative.   Psychiatric/Behavioral: Negative.   All other systems reviewed and are negative.    Today's Vitals   06/30/20 1154  BP: (!) 142/86  Pulse: 73  Temp: 98 F (36.7 C)  TempSrc: Oral  Weight: 189 lb 3.2 oz (85.8 kg)  Height: $Remove'5\' 2"'UsnMCtb$  (1.575 m)  PainSc: 0-No pain   Body mass index is 34.61 kg/m.  Wt Readings from Last 3 Encounters:  06/30/20 189 lb 3.2 oz (85.8 kg)  05/15/20 194 lb 3.2 oz (88.1 kg)  04/03/20 194 lb 1.6 oz (88  kg)   Objective:  Physical Exam Vitals and nursing note reviewed.  Constitutional:      Appearance: Normal appearance. She is obese.  HENT:     Head: Normocephalic and atraumatic.  Cardiovascular:     Rate and Rhythm: Normal rate and regular rhythm.     Heart sounds: Normal heart sounds.  Pulmonary:     Breath sounds: Normal breath sounds.  Skin:    General: Skin is warm.  Neurological:     General: No focal deficit present.     Mental Status: She is alert and oriented to person, place, and time.         Assessment And Plan:     1. Type 2 diabetes mellitus with stage 3a chronic kidney disease, without long-term current use of insulin ([place])  Comments: Chronic, I will check labs as listed below. She is encouraged to strive for at least 150 minutes of exercise per week.  I will recheck GFR today. IF still in stage 3 CKD, will refer her to Renal.   - CMP14+EGFR - Hemoglobin A1c  2. Hypertensive heart and renal disease with renal failure, stage 1 through stage 4 or unspecified chronic kidney disease, without heart failure Comments: Chronic, fair control. She will continue with current meds. She is encouraged to avoid adding salt to her foods.   3. CAD s/p CABG Comments: Chronic, she is not having any anginal symptoms at this time.   4. Pure hypercholesterolemia Comments: Chronic. She is aware LDL goal is less than 70. Advised to avoid fried foods, exercise regularly and follow a heart healthy lifestyle.   5. Class 1 obesity due to excess calories with serious comorbidity and body mass index (BMI) of 34.0 to 34.9 in adult Comments: She is encouraged to strive for BMI less than 30 to decrease cardiac risk.      Patient was given opportunity to ask questions. Patient verbalized understanding of the plan and was able to repeat key elements of the plan. All questions were answered to their satisfaction.  [name], MD   I, [name], MD, have reviewed all  documentation for this visit. The documentation on 06/30/20 for the exam, diagnosis, procedures, and orders are all accurate and complete.  THE PATIENT IS ENCOURAGED TO PRACTICE SOCIAL DISTANCING DUE TO THE COVID-19 PANDEMIC.

## 2020-06-30 NOTE — Patient Instructions (Signed)
Diabetes Mellitus and Exercise Exercising regularly is important for your overall health, especially when you have diabetes (diabetes mellitus). Exercising is not only about losing weight. It has many other health benefits, such as increasing muscle strength and bone density and reducing body fat and stress. This leads to improved fitness, flexibility, and endurance, all of which result in better overall health. Exercise has additional benefits for people with diabetes, including:  Reducing appetite.  Helping to lower and control blood glucose.  Lowering blood pressure.  Helping to control amounts of fatty substances (lipids) in the blood, such as cholesterol and triglycerides.  Helping the body to respond better to insulin (improving insulin sensitivity).  Reducing how much insulin the body needs.  Decreasing the risk for heart disease by: ? Lowering cholesterol and triglyceride levels. ? Increasing the levels of good cholesterol. ? Lowering blood glucose levels. What is my activity plan? Your health care provider or certified diabetes educator can help you make a plan for the type and frequency of exercise (activity plan) that works for you. Make sure that you:  Do at least 150 minutes of moderate-intensity or vigorous-intensity exercise each week. This could be brisk walking, biking, or water aerobics. ? Do stretching and strength exercises, such as yoga or weightlifting, at least 2 times a week. ? Spread out your activity over at least 3 days of the week.  Get some form of physical activity every day. ? Do not go more than 2 days in a row without some kind of physical activity. ? Avoid being inactive for more than 30 minutes at a time. Take frequent breaks to walk or stretch.  Choose a type of exercise or activity that you enjoy, and set realistic goals.  Start slowly, and gradually increase the intensity of your exercise over time. What do I need to know about managing my  diabetes?   Check your blood glucose before and after exercising. ? If your blood glucose is 240 mg/dL (13.3 mmol/L) or higher before you exercise, check your urine for ketones. If you have ketones in your urine, do not exercise until your blood glucose returns to normal. ? If your blood glucose is 100 mg/dL (5.6 mmol/L) or lower, eat a snack containing 15-20 grams of carbohydrate. Check your blood glucose 15 minutes after the snack to make sure that your level is above 100 mg/dL (5.6 mmol/L) before you start your exercise.  Know the symptoms of low blood glucose (hypoglycemia) and how to treat it. Your risk for hypoglycemia increases during and after exercise. Common symptoms of hypoglycemia can include: ? Hunger. ? Anxiety. ? Sweating and feeling clammy. ? Confusion. ? Dizziness or feeling light-headed. ? Increased heart rate or palpitations. ? Blurry vision. ? Tingling or numbness around the mouth, lips, or tongue. ? Tremors or shakes. ? Irritability.  Keep a rapid-acting carbohydrate snack available before, during, and after exercise to help prevent or treat hypoglycemia.  Avoid injecting insulin into areas of the body that are going to be exercised. For example, avoid injecting insulin into: ? The arms, when playing tennis. ? The legs, when jogging.  Keep records of your exercise habits. Doing this can help you and your health care provider adjust your diabetes management plan as needed. Write down: ? Food that you eat before and after you exercise. ? Blood glucose levels before and after you exercise. ? The type and amount of exercise you have done. ? When your insulin is expected to peak, if you use   insulin. Avoid exercising at times when your insulin is peaking.  When you start a new exercise or activity, work with your health care provider to make sure the activity is safe for you, and to adjust your insulin, medicines, or food intake as needed.  Drink plenty of water while  you exercise to prevent dehydration or heat stroke. Drink enough fluid to keep your urine clear or pale yellow. Summary  Exercising regularly is important for your overall health, especially when you have diabetes (diabetes mellitus).  Exercising has many health benefits, such as increasing muscle strength and bone density and reducing body fat and stress.  Your health care provider or certified diabetes educator can help you make a plan for the type and frequency of exercise (activity plan) that works for you.  When you start a new exercise or activity, work with your health care provider to make sure the activity is safe for you, and to adjust your insulin, medicines, or food intake as needed. This information is not intended to replace advice given to you by your health care provider. Make sure you discuss any questions you have with your health care provider. Document Revised: 05/04/2017 Document Reviewed: 03/21/2016 Elsevier Patient Education  2020 Elsevier Inc.  

## 2020-07-01 LAB — CMP14+EGFR
ALT: 21 IU/L (ref 0–32)
AST: 19 IU/L (ref 0–40)
Albumin/Globulin Ratio: 1.8 (ref 1.2–2.2)
Albumin: 4.2 g/dL (ref 3.8–4.8)
Alkaline Phosphatase: 91 IU/L (ref 48–121)
BUN/Creatinine Ratio: 11 — ABNORMAL LOW (ref 12–28)
BUN: 13 mg/dL (ref 8–27)
Bilirubin Total: 0.4 mg/dL (ref 0.0–1.2)
CO2: 27 mmol/L (ref 20–29)
Calcium: 9.9 mg/dL (ref 8.7–10.3)
Chloride: 99 mmol/L (ref 96–106)
Creatinine, Ser: 1.22 mg/dL — ABNORMAL HIGH (ref 0.57–1.00)
GFR calc Af Amer: 54 mL/min/{1.73_m2} — ABNORMAL LOW (ref >59)
GFR calc non Af Amer: 47 mL/min/{1.73_m2} — ABNORMAL LOW (ref >59)
Globulin, Total: 2.4 g/dL (ref 1.5–4.5)
Glucose: 88 mg/dL (ref 65–99)
Potassium: 4.4 mmol/L (ref 3.5–5.2)
Sodium: 137 mmol/L (ref 134–144)
Total Protein: 6.6 g/dL (ref 6.0–8.5)

## 2020-07-01 LAB — HEMOGLOBIN A1C
Est. average glucose Bld gHb Est-mCnc: 123 mg/dL
Hgb A1c MFr Bld: 5.9 % — ABNORMAL HIGH (ref 4.8–5.6)

## 2020-07-02 ENCOUNTER — Encounter: Payer: Self-pay | Admitting: Internal Medicine

## 2020-07-03 ENCOUNTER — Other Ambulatory Visit: Payer: BC Managed Care – PPO

## 2020-07-06 ENCOUNTER — Other Ambulatory Visit: Payer: Self-pay | Admitting: Cardiovascular Disease

## 2020-07-15 ENCOUNTER — Other Ambulatory Visit: Payer: Self-pay

## 2020-07-15 DIAGNOSIS — N1831 Chronic kidney disease, stage 3a: Secondary | ICD-10-CM

## 2020-07-15 DIAGNOSIS — N182 Chronic kidney disease, stage 2 (mild): Secondary | ICD-10-CM

## 2020-07-15 NOTE — Progress Notes (Signed)
ref

## 2020-07-22 ENCOUNTER — Other Ambulatory Visit: Payer: Self-pay

## 2020-07-22 ENCOUNTER — Encounter: Payer: Self-pay | Admitting: Adult Health

## 2020-07-22 ENCOUNTER — Ambulatory Visit: Payer: BC Managed Care – PPO | Admitting: Adult Health

## 2020-07-22 VITALS — BP 128/82 | HR 69 | Ht 62.0 in | Wt 188.4 lb

## 2020-07-22 DIAGNOSIS — R569 Unspecified convulsions: Secondary | ICD-10-CM | POA: Diagnosis not present

## 2020-07-22 DIAGNOSIS — G4733 Obstructive sleep apnea (adult) (pediatric): Secondary | ICD-10-CM

## 2020-07-22 DIAGNOSIS — Z9989 Dependence on other enabling machines and devices: Secondary | ICD-10-CM | POA: Diagnosis not present

## 2020-07-22 NOTE — Progress Notes (Signed)
PATIENT: Sara [name] DOB: [birth year]  REASON FOR VISIT: follow up HISTORY FROM: patient  HISTORY OF PRESENT ILLNESS: Today 07/22/20:  Sara [name] is a 64 year old female with a history of seizures and obstructive sleep apnea.  She returns today for follow-up.  Since her [name] was increased she reports that her husband has not reported any nocturnal events.  Overall she feels that she has been doing well.  Her CPAP download indicates that she use her machine nightly for compliance of 100%.  She used her machine greater than 4 hours each night.  On average she uses her machine 6 hours and 36 minutes.  Her residual AHI is 1.6 on 9 cm of water with EPR of 2.  Leak in the 95th percentile is 42 L/min.  She states that she does not feel the machine leaking at night.  Overall she feels that she is doing well with the CPAP  HISTORY 03/02/20  Sara [name] is a 64 year old female with history of seizures and obstructive sleep apnea on CPAP.  Her seizures are typically characterized as staring off.  There was question of possible nocturnal seizure last year when she was taking Ozempic, question possible nocturnal seizure described as chewing around 2 AM.  [name] level  was normal, she remained on [name] 100 mg twice daily.  She is overall doing well, has reported 1 or 2 episodes her husband describes as, her making a loud noise, he couldn't wake her, when wearing CPAP, he went back to sleep. She has been trying to not eat before bed.  Recent A1c was 6.3.  She reports increase in stress.  She says when her husband notices these episodes, it it when she she is really tired, and not well rested.  She really does not want to go up on [name], she requires brand-name.  She presents today for evaluation unaccompanied.  Checked CPAP for equipment function, used for more than 4 hours, 29 out of 30 days, 97%, average usage (days used) 6 hours 44 minutes, set pressure 9 cmH2O, leak in the 95th percentile was  48.5, AHI 2.8.   REVIEW OF SYSTEMS: Out of a complete 14 system review of symptoms, the patient complains only of the following symptoms, and all other reviewed systems are negative.  Epworth sleepiness score 2 Fatigue severity score 17  ALLERGIES: Allergies  Allergen Reactions  . Sulfa Antibiotics Itching    HOME MEDICATIONS: Outpatient Medications Prior to Visit  Medication Sig Dispense Refill  . acetaminophen (TYLENOL) 325 MG tablet Take 2 tablets (650 mg total) by mouth every 6 (six) hours as needed for mild pain.    Marland Kitchen anagrelide ([name]) 0.5 MG capsule Take 1 capsule (0.5 mg total) by mouth at bedtime. On Mondays, Wednesdays and Fridays (Patient taking differently: Take 0.5 mg by mouth at bedtime. 1 and 1/2 tab On Mondays, Wednesdays and Fridays) 15 capsule 5  . anagrelide ([name]) 1 MG capsule Take 1 capsule (1 mg total) by mouth daily. 30 capsule 5  . aspirin EC 81 MG tablet Take 1 tablet (81 mg total) by mouth daily. Swallow whole. (Patient taking differently: Take 81 mg by mouth daily. 2 tabs at bedtime) 150 tablet 2  . atorvastatin (LIPITOR) 40 MG tablet TAKE 1 TABLET BY MOUTH DAILY AT 6 PM 90 tablet 3  . B Complex Vitamins (B COMPLEX PO) Take 1 tablet by mouth daily.     . Blood Glucose Monitoring Suppl (ONETOUCH VERIO IQ SYSTEM) w/Device KIT Use as  directed to check blood sugars 1 time per day dx: e11.22 1 kit 1  . Blood Pressure Monitoring (BLOOD PRESSURE CUFF) MISC Check blood pressure 1-2 times a day 3 h after taking BP medication.  Dx- CAD        HTN. 1 each 0  . Cholecalciferol (VITAMIN D3) 2000 UNITS capsule Take 2,000 Units by mouth daily.     Marland Kitchen escitalopram (LEXAPRO) 10 MG tablet 10 mg daily. Pt states as needed    . glucose blood (ONETOUCH VERIO) test strip USE AS DIRECTED TO TEST BLOOD SUGR LEVELS DAILY 150 strip 3  . hydrochlorothiazide (MICROZIDE) 12.5 MG capsule TAKE 1 CAPSULE(12.5 MG) BY MOUTH DAILY 90 capsule 3  . metoprolol tartrate (LOPRESSOR) 50 MG  tablet TAKE 1 AND 1/2 TABLETS BY MOUTH TWICE DAILY 45 tablet 10  . OneTouch Delica Lancets 32G MISC USE AS DIRECTED TO CHECK BLOOD SUGAR LEVELS ONCE DAILY 200 each 2  . Specialty Vitamins Products (MAGNESIUM, AMINO ACID CHELATE,) 133 MG tablet Take 1 tablet by mouth 2 (two) times daily.    Marland Kitchen telmisartan (MICARDIS) 80 MG tablet TAKE 1 TABLET BY MOUTH EVERY DAY 90 tablet 1  . [name] 100 MG TABS Take 1 tablet in the morning, take 1.5 tablets in the evening 210 tablet 1   No facility-administered medications prior to visit.    PAST MEDICAL HISTORY: Past Medical History:  Diagnosis Date  . Diabetes mellitus   . Gout   . Hyperlipemia   . Hypertension   . LVH (left ventricular hypertrophy)    12/21/09 Echo -mild asymmetric LVH,EF =>55%  . Morbid obesity ([place])   . OSA (obstructive sleep apnea)   . PAD (peripheral artery disease) ([place])   . Renal artery stenosis ([place]) 12/13/2010   PTCA and Stent - right  . Seizures ([place])   . Syncopal episodes   . Thrombocytosis ([place])     PAST SURGICAL HISTORY: Past Surgical History:  Procedure Laterality Date  . BREAST CYST EXCISION     S/P Benign Right Breast Cyst Removal.  . BREAST REDUCTION SURGERY  1995   S/P Bilateral breast reduction  . [place]   Times  Two.  . CORONARY ARTERY BYPASS GRAFT N/A 04/21/2017   Procedure: CORONARY ARTERY BYPASS GRAFTING (CABG) x 2, USING LEFT MAMMARY ARTERY AND RIGHT GREATER SAPHENOUS VEIN HARVESTED ENDOSCOPICALLY;  Surgeon: [name], MD;  Location: [facility];  Service: Open Heart Surgery;  Laterality: N/A;  . EXTREMITY CYST EXCISION  1993   left wrist  . LEFT HEART CATH AND CORONARY ANGIOGRAPHY N/A 04/17/2017   Procedure: Left Heart Cath and Coronary Angiography;  Surgeon: Jettie Booze, MD;  Location: [facility];  Service: Cardiovascular;  Laterality: N/A;  . PARTIAL HYSTERECTOMY  1994  . renal artery stent placement Right   . TEE WITHOUT CARDIOVERSION N/A 04/21/2017    Procedure: TRANSESOPHAGEAL ECHOCARDIOGRAM (TEE);  Surgeon: [name], MD;  Location: [facility];  Service: Open Heart Surgery;  Laterality: N/A;    FAMILY HISTORY: Family History  Problem Relation Age of Onset  . Heart failure Mother   . Diabetes Father   . Diabetes Sister   . Hypertension Sister   . Hypertension Sister   . Hypertension Sister   . Breast cancer Maternal Aunt        2 maternal aunts had breast cancer.  . Breast cancer Maternal Grandmother     SOCIAL HISTORY: Social History   Socioeconomic History  .  Marital status: Married    Spouse name: Juanda Crumble  . Number of children: 2  . Years of education: 60  . Highest education level: Not on file  Occupational History  . Occupation: TRANSPORTATION COOR.    Employer: [employer]  Tobacco Use  . Smoking status: Never Smoker  . Smokeless tobacco: Never Used  Vaping Use  . Vaping Use: Never used  Substance and Sexual Activity  . Alcohol use: No    Alcohol/week: 0.0 standard drinks  . Drug use: No  . Sexual activity: Yes  Other Topics Concern  . Not on file  Social History Narrative   Patient lives at husband and her son, home with family.   Patient has two adult children.   Patient is not drinking any caffeine.   Patient is working full-time.   Patient has a college education.   Patient is right-handed.   Social Determinants of Health   Financial Resource Strain:   . Difficulty of Paying Living Expenses: Not on file  Food Insecurity:   . Worried About Charity fundraiser in the Last Year: Not on file  . Ran Out of Food in the Last Year: Not on file  Transportation Needs:   . Lack of Transportation (Medical): Not on file  . Lack of Transportation (Non-Medical): Not on file  Physical Activity:   . Days of Exercise per Week: Not on file  . Minutes of Exercise per Session: Not on file  Stress:   . Feeling of Stress : Not on file  Social Connections:   . Frequency of Communication with Friends and  Family: Not on file  . Frequency of Social Gatherings with Friends and Family: Not on file  . Attends Religious Services: Not on file  . Active Member of Clubs or Organizations: Not on file  . Attends Archivist Meetings: Not on file  . Marital Status: Not on file  Intimate Partner Violence:   . Fear of Current or Ex-Partner: Not on file  . Emotionally Abused: Not on file  . Physically Abused: Not on file  . Sexually Abused: Not on file      PHYSICAL EXAM  Vitals:   07/22/20 0822  BP: 128/82  Pulse: 69  Weight: 188 lb 6.4 oz (85.5 kg)  Height: 5' 2" (1.575 m)   Body mass index is 34.46 kg/m.  Generalized: Well developed, in no acute distress   Neurological examination  Mentation: Alert oriented to time, place, history taking. Follows all commands speech and language fluent Cranial nerve II-XII: Pupils were equal round reactive to light. Extraocular movements were full, visual field were full on confrontational test.. Head turning and shoulder shrug  were normal and symmetric. Motor: The motor testing reveals 5 over 5 strength of all 4 extremities. Good symmetric motor tone is noted throughout.  Sensory: Sensory testing is intact to soft touch on all 4 extremities. No evidence of extinction is noted.  Coordination: Cerebellar testing reveals good finger-nose-finger and heel-to-shin bilaterally.  Gait and station: Gait is normal.   Reflexes: Deep tendon reflexes are symmetric and normal bilaterally.   DIAGNOSTIC DATA (LABS, IMAGING, TESTING) - I reviewed patient records, labs, notes, testing and imaging myself where available.  Lab Results  Component Value Date   WBC 8.5 04/03/2020   HGB 13.3 04/03/2020   HCT 40.3 04/03/2020   MCV 89.6 04/03/2020   PLT 265 04/03/2020      Component Value Date/Time   NA 137 06/30/2020 1429  NA 139 08/11/2017 1528   K 4.4 06/30/2020 1429   K 4.2 08/11/2017 1528   CL 99 06/30/2020 1429   CL 102 03/19/2013 1535   CO2 27  06/30/2020 1429   CO2 27 08/11/2017 1528   GLUCOSE 88 06/30/2020 1429   GLUCOSE 99 01/03/2020 1037   GLUCOSE 93 08/11/2017 1528   GLUCOSE 117 (H) 03/19/2013 1535   BUN 13 06/30/2020 1429   BUN 15.0 08/11/2017 1528   CREATININE 1.22 (H) 06/30/2020 1429   CREATININE 1.1 08/11/2017 1528   CALCIUM 9.9 06/30/2020 1429   CALCIUM 9.2 08/11/2017 1528   PROT 6.6 06/30/2020 1429   PROT 7.0 08/11/2017 1528   ALBUMIN 4.2 06/30/2020 1429   ALBUMIN 3.6 08/11/2017 1528   AST 19 06/30/2020 1429   AST 27 08/11/2017 1528   ALT 21 06/30/2020 1429   ALT 26 08/11/2017 1528   ALKPHOS 91 06/30/2020 1429   ALKPHOS 88 08/11/2017 1528   BILITOT 0.4 06/30/2020 1429   BILITOT 0.37 08/11/2017 1528   GFRNONAA 47 (L) 06/30/2020 1429   GFRAA 54 (L) 06/30/2020 1429   Lab Results  Component Value Date   CHOL 102 10/28/2019   HDL 43 10/28/2019   LDLCALC 47 10/28/2019   TRIG 46 10/28/2019   CHOLHDL 2.4 10/28/2019   Lab Results  Component Value Date   HGBA1C 5.9 (H) 06/30/2020   Lab Results  Component Value Date   VITAMINB12 924 03/13/2019   Lab Results  Component Value Date   TSH 2.060 11/22/2018      ASSESSMENT AND PLAN 64 y.o. year old female  has a past medical history of Diabetes mellitus, Gout, Hyperlipemia, Hypertension, LVH (left ventricular hypertrophy), Morbid obesity ([place]), OSA (obstructive sleep apnea), PAD (peripheral artery disease) ([place]), Renal artery stenosis ([place]) (12/13/2010), Seizures ([place]), Syncopal episodes, and Thrombocytosis ([place]). here with:  1.  Obstructive sleep apnea on CPAP   CPAP download shows excellent compliance  Good treatment of apnea  Encouraged patient to continue using CPAP nightly and greater than 4 hours each night  2.  Seizures   Continue [name] 100 mg in the morning and 150 mg in the evening  Advised if she has any seizure event she should let us know  Follow-up in 1 year or sooner if needed   I spent 25 minutes of face-to-face and  non-face-to-face time with patient.  This included previsit chart review, lab review, study review, order entry, electronic health record documentation, patient education.  Ward Givens, MSN, NP-C 07/22/2020, 8:33 AM [facility] [address], [place], [place] [address] [phone]

## 2020-07-22 NOTE — Patient Instructions (Signed)
Your Plan:  Continue CPAP Continue [name] If your symptoms worsen or you develop new symptoms please let us know.   Thank you for coming to see [place] at [facility]. I hope we have been able to provide you high quality care today.  You may receive a patient satisfaction survey over the next few weeks. We would appreciate your feedback and comments so that we may continue to improve ourselves and the health of our patients.

## 2020-07-31 ENCOUNTER — Telehealth: Payer: Self-pay

## 2020-07-31 NOTE — Telephone Encounter (Signed)
Sara [name] called stating she has a bruise on her tongue.  She has no other bruising or bleeding.  Lab appt made for Monday 08/03/2020.  I instructed her to go to the ED if she should develop blood in her urine or stool. She verbalized understanding.

## 2020-08-03 ENCOUNTER — Other Ambulatory Visit: Payer: Self-pay

## 2020-08-03 ENCOUNTER — Inpatient Hospital Stay: Payer: BC Managed Care – PPO | Attending: Nurse Practitioner

## 2020-08-03 ENCOUNTER — Encounter: Payer: Self-pay | Admitting: Hematology

## 2020-08-03 DIAGNOSIS — D473 Essential (hemorrhagic) thrombocythemia: Secondary | ICD-10-CM | POA: Diagnosis present

## 2020-08-03 DIAGNOSIS — Z0000 Encounter for general adult medical examination without abnormal findings: Secondary | ICD-10-CM

## 2020-08-03 LAB — COMPREHENSIVE METABOLIC PANEL
ALT: 20 U/L (ref 0–44)
AST: 18 U/L (ref 15–41)
Albumin: 3.4 g/dL — ABNORMAL LOW (ref 3.5–5.0)
Alkaline Phosphatase: 82 U/L (ref 38–126)
Anion gap: 4 — ABNORMAL LOW (ref 5–15)
BUN: 19 mg/dL (ref 8–23)
CO2: 32 mmol/L (ref 22–32)
Calcium: 10.2 mg/dL (ref 8.9–10.3)
Chloride: 101 mmol/L (ref 98–111)
Creatinine, Ser: 1.39 mg/dL — ABNORMAL HIGH (ref 0.44–1.00)
GFR, Estimated: 40 mL/min — ABNORMAL LOW (ref >60)
Glucose, Bld: 95 mg/dL (ref 70–99)
Potassium: 3.8 mmol/L (ref 3.5–5.1)
Sodium: 137 mmol/L (ref 135–145)
Total Bilirubin: 0.5 mg/dL (ref 0.3–1.2)
Total Protein: 6.9 g/dL (ref 6.5–8.1)

## 2020-08-03 LAB — CBC WITH DIFFERENTIAL/PLATELET
Abs Immature Granulocytes: 0.03 10*3/uL (ref 0.00–0.07)
Basophils Absolute: 0.1 10*3/uL (ref 0.0–0.1)
Basophils Relative: 1 %
Eosinophils Absolute: 0.2 10*3/uL (ref 0.0–0.5)
Eosinophils Relative: 3 %
HCT: 41.8 % (ref 36.0–46.0)
Hemoglobin: 13.6 g/dL (ref 12.0–15.0)
Immature Granulocytes: 0 %
Lymphocytes Relative: 19 %
Lymphs Abs: 1.4 10*3/uL (ref 0.7–4.0)
MCH: 29 pg (ref 26.0–34.0)
MCHC: 32.5 g/dL (ref 30.0–36.0)
MCV: 89.1 fL (ref 80.0–100.0)
Monocytes Absolute: 0.6 10*3/uL (ref 0.1–1.0)
Monocytes Relative: 8 %
Neutro Abs: 5.1 10*3/uL (ref 1.7–7.7)
Neutrophils Relative %: 69 %
Platelets: 262 10*3/uL (ref 150–400)
RBC: 4.69 MIL/uL (ref 3.87–5.11)
RDW: 13.5 % (ref 11.5–15.5)
WBC: 7.5 10*3/uL (ref 4.0–10.5)
nRBC: 0 % (ref 0.0–0.2)

## 2020-08-05 ENCOUNTER — Telehealth: Payer: Self-pay

## 2020-08-05 NOTE — Telephone Encounter (Signed)
Per Dr. Burr Medico patient is to continue same dose of anagrelide.  If she develops bruising again she is to call us.  She verbalized understanding.

## 2020-08-08 ENCOUNTER — Encounter: Payer: Self-pay | Admitting: Adult Health

## 2020-08-08 ENCOUNTER — Encounter: Payer: Self-pay | Admitting: Hematology

## 2020-08-10 ENCOUNTER — Encounter: Payer: Self-pay | Admitting: Internal Medicine

## 2020-08-19 ENCOUNTER — Encounter: Payer: Self-pay | Admitting: Internal Medicine

## 2020-08-19 ENCOUNTER — Encounter: Payer: Self-pay | Admitting: Adult Health

## 2020-08-19 ENCOUNTER — Encounter (HOSPITAL_COMMUNITY): Payer: Self-pay

## 2020-08-19 ENCOUNTER — Emergency Department (HOSPITAL_COMMUNITY)
Admission: EM | Admit: 2020-08-19 | Discharge: 2020-08-20 | Disposition: A | Discharge status: Home / Self Care | Payer: BC Managed Care – PPO | Attending: Emergency Medicine | Admitting: Emergency Medicine

## 2020-08-19 DIAGNOSIS — K068 Other specified disorders of gingiva and edentulous alveolar ridge: Secondary | ICD-10-CM | POA: Diagnosis present

## 2020-08-19 DIAGNOSIS — I5030 Unspecified diastolic (congestive) heart failure: Secondary | ICD-10-CM | POA: Insufficient documentation

## 2020-08-19 DIAGNOSIS — N182 Chronic kidney disease, stage 2 (mild): Secondary | ICD-10-CM | POA: Insufficient documentation

## 2020-08-19 DIAGNOSIS — Z7901 Long term (current) use of anticoagulants: Secondary | ICD-10-CM | POA: Diagnosis not present

## 2020-08-19 DIAGNOSIS — E1122 Type 2 diabetes mellitus with diabetic chronic kidney disease: Secondary | ICD-10-CM | POA: Insufficient documentation

## 2020-08-19 DIAGNOSIS — Z79899 Other long term (current) drug therapy: Secondary | ICD-10-CM | POA: Insufficient documentation

## 2020-08-19 DIAGNOSIS — I2510 Atherosclerotic heart disease of native coronary artery without angina pectoris: Secondary | ICD-10-CM | POA: Insufficient documentation

## 2020-08-19 DIAGNOSIS — Z7982 Long term (current) use of aspirin: Secondary | ICD-10-CM | POA: Diagnosis not present

## 2020-08-19 DIAGNOSIS — I130 Hypertensive heart and chronic kidney disease with heart failure and stage 1 through stage 4 chronic kidney disease, or unspecified chronic kidney disease: Secondary | ICD-10-CM | POA: Insufficient documentation

## 2020-08-19 DIAGNOSIS — Z951 Presence of aortocoronary bypass graft: Secondary | ICD-10-CM | POA: Insufficient documentation

## 2020-08-19 NOTE — ED Triage Notes (Signed)
Pt reports that she was going to bed and noticed that she was bleeding in her mouth on the R lower side, unable to tell if bleeding is from the gum line or the R lower cheek. Denies biting lip

## 2020-08-20 ENCOUNTER — Encounter: Payer: Self-pay | Admitting: Internal Medicine

## 2020-08-20 ENCOUNTER — Encounter (HOSPITAL_COMMUNITY): Payer: Self-pay | Admitting: Emergency Medicine

## 2020-08-20 LAB — PROTIME-INR
INR: 1 (ref 0.8–1.2)
Prothrombin Time: 13.1 seconds (ref 11.4–15.2)

## 2020-08-20 LAB — CBC
HCT: 36.4 % (ref 36.0–46.0)
Hemoglobin: 12.3 g/dL (ref 12.0–15.0)
MCH: 29.4 pg (ref 26.0–34.0)
MCHC: 33.8 g/dL (ref 30.0–36.0)
MCV: 86.9 fL (ref 80.0–100.0)
Platelets: 260 10*3/uL (ref 150–400)
RBC: 4.19 MIL/uL (ref 3.87–5.11)
RDW: 12.9 % (ref 11.5–15.5)
WBC: 11 10*3/uL — ABNORMAL HIGH (ref 4.0–10.5)
nRBC: 0 % (ref 0.0–0.2)

## 2020-08-20 MED ORDER — LIDOCAINE-EPINEPHRINE (PF) 2 %-1:200000 IJ SOLN
20.0000 mL | Freq: Once | INTRAMUSCULAR | Status: DC
  Filled 2020-08-20: qty 20

## 2020-08-20 MED ORDER — TRANEXAMIC ACID FOR EPISTAXIS
500.0000 mg | Freq: Once | TOPICAL | Status: DC
  Filled 2020-08-20: qty 10

## 2020-08-20 NOTE — ED Provider Notes (Signed)
[facility] EMERGENCY DEPARTMENT Provider Note   CSN: [id number] Arrival date & time: 08/19/20  2150     History Chief Complaint  Patient presents with  . Mouth bleeding    Sara [name] is a 64 y.o. female.  Patient presents to the emergency department with a chief complaint of bleeding gums.  She is on an antiplatelet. She states that she noticed the bleeding tonight around bed time.  She denies any injuries.  She has been unable to control the bleeding.  She denies any other problems tonight.  The history is provided by the patient. No language interpreter was used.       Past Medical History:  Diagnosis Date  . Diabetes mellitus   . Gout   . Hyperlipemia   . Hypertension   . LVH (left ventricular hypertrophy)    12/21/09 Echo -mild asymmetric LVH,EF =>55%  . Morbid obesity ([place])   . OSA (obstructive sleep apnea)   . PAD (peripheral artery disease) ([place])   . Renal artery stenosis ([place]) 12/13/2010   PTCA and Stent - right  . Seizures ([place])   . Syncopal episodes   . Thrombocytosis     Patient Active Problem List   Diagnosis Date Noted  . Nephropathy 07/28/2018  . Chronic kidney disease, stage 2 (mild) 07/28/2018  . History of renal artery stenosis 05/16/2018  . CAD s/p CABG  05/16/2018  . S/P CABG x 2 04/21/2017  . Elevated troponin   . NSTEMI (non-ST elevated myocardial infarction) ([place])   . Chest pain 04/15/2017  . ACS (acute coronary syndrome) ([place]) 04/15/2017  . Morbid obesity ([place]) 11/02/2016  . Seizures ([place]) 01/19/2016  . Typical absence seizures 03/28/2014  . Sleep apnea with use of continuous positive airway pressure (CPAP) 01/30/2014  . Hypercholesterolemia 01/04/2014  . OSA (obstructive sleep apnea) 10/28/2013  . Syncope 07/21/2013  . Type 2 diabetes mellitus ([place]) 07/20/2013  . Acute renal failure ([place]) 07/20/2013  . History of renal stent 07/20/2013  . Orthostatic hypotension 07/20/2013  . Diastolic CHF ([place]) 26/71/2458   . Essential thrombocythemia ([place]) 04/19/2012  . Hypertension 04/19/2012  . Renal artery stenosis (right) s/p stent 2012 12/13/2010    Past Surgical History:  Procedure Laterality Date  . BREAST CYST EXCISION     S/P Benign Right Breast Cyst Removal.  . BREAST REDUCTION SURGERY  1995   S/P Bilateral breast reduction  . [place]   Times  Two.  . CORONARY ARTERY BYPASS GRAFT N/A 04/21/2017   Procedure: CORONARY ARTERY BYPASS GRAFTING (CABG) x 2, USING LEFT MAMMARY ARTERY AND RIGHT GREATER SAPHENOUS VEIN HARVESTED ENDOSCOPICALLY;  Surgeon: [name], MD;  Location: [place];  Service: Open Heart Surgery;  Laterality: N/A;  . EXTREMITY CYST EXCISION  1993   left wrist  . LEFT HEART CATH AND CORONARY ANGIOGRAPHY N/A 04/17/2017   Procedure: Left Heart Cath and Coronary Angiography;  Surgeon: Jettie Booze, MD;  Location: [place] CV [facility];  Service: Cardiovascular;  Laterality: N/A;  . PARTIAL HYSTERECTOMY  1994  . renal artery stent placement Right   . TEE WITHOUT CARDIOVERSION N/A 04/21/2017   Procedure: TRANSESOPHAGEAL ECHOCARDIOGRAM (TEE);  Surgeon: [name], MD;  Location: [place];  Service: Open Heart Surgery;  Laterality: N/A;     OB History   No obstetric history on file.     Family History  Problem Relation Age of Onset  . Heart failure Mother   . Diabetes  Father   . Diabetes Sister   . Hypertension Sister   . Hypertension Sister   . Hypertension Sister   . Breast cancer Maternal Aunt        2 maternal aunts had breast cancer.  . Breast cancer Maternal Grandmother     Social History   Tobacco Use  . Smoking status: Never Smoker  . Smokeless tobacco: Never Used  Vaping Use  . Vaping Use: Never used  Substance Use Topics  . Alcohol use: No    Alcohol/week: 0.0 standard drinks  . Drug use: No    Home Medications Prior to Admission medications   Medication Sig Start Date End Date Taking? Authorizing Provider  acetaminophen  (TYLENOL) 325 MG tablet Take 2 tablets (650 mg total) by mouth every 6 (six) hours as needed for mild pain. 04/26/17   Nani Skillern, PA-C  anagrelide ([name]) 0.5 MG capsule Take 1 capsule (0.5 mg total) by mouth at bedtime. On Mondays, Wednesdays and Fridays Patient taking differently: Take 0.5 mg by mouth at bedtime. 1 and 1/2 tab On Mondays, Wednesdays and Fridays 04/03/20   Truitt Merle, MD  anagrelide ([name]) 1 MG capsule Take 1 capsule (1 mg total) by mouth daily. 04/03/20   Truitt Merle, MD  aspirin EC 81 MG tablet Take 1 tablet (81 mg total) by mouth daily. Swallow whole. Patient taking differently: Take 81 mg by mouth daily. 2 tabs at bedtime 05/18/20 05/18/21  [name], MD  atorvastatin (LIPITOR) 40 MG tablet TAKE 1 TABLET BY MOUTH DAILY AT 6 PM 10/08/19   Croitoru, Mihai, MD  B Complex Vitamins (B COMPLEX PO) Take 1 tablet by mouth daily.     [provider]  Blood Glucose Monitoring Suppl (ONETOUCH VERIO IQ SYSTEM) w/Device KIT Use as directed to check blood sugars 1 time per day dx: e11.22 05/22/19   [name], MD  Blood Pressure Monitoring (BLOOD PRESSURE CUFF) MISC Check blood pressure 1-2 times a day 3 h after taking BP medication.  Dx- CAD        HTN. 11/22/18   Rodriguez-Southworth, Sunday Spillers, PA-C  Cholecalciferol (VITAMIN D3) 2000 UNITS capsule Take 2,000 Units by mouth daily.     [provider]  escitalopram (LEXAPRO) 10 MG tablet 10 mg daily. Pt states as needed 06/29/19   [provider]  glucose blood (ONETOUCH VERIO) test strip USE AS DIRECTED TO TEST BLOOD SUGR LEVELS DAILY 06/09/20   [name], MD  hydrochlorothiazide (MICROZIDE) 12.5 MG capsule TAKE 1 CAPSULE(12.5 MG) BY MOUTH DAILY 06/01/20   Croitoru, Mihai, MD  metoprolol tartrate (LOPRESSOR) 50 MG tablet TAKE 1 AND 1/2 TABLETS BY MOUTH TWICE DAILY 07/06/20   Croitoru, Dani Gobble, MD  OneTouch Delica Lancets 53Z MISC USE AS DIRECTED TO CHECK BLOOD SUGAR LEVELS ONCE DAILY 05/25/20   [name], MD  Specialty Vitamins Products (MAGNESIUM, AMINO ACID CHELATE,) 133 MG tablet Take 1 tablet by mouth 2 (two) times daily.    [provider]  telmisartan (MICARDIS) 80 MG tablet TAKE 1 TABLET BY MOUTH EVERY DAY 06/30/20   [name], MD  [name] 100 MG TABS Take 1 tablet in the morning, take 1.5 tablets in the evening 03/30/20   Suzzanne Cloud, NP    Allergies    Sulfa antibiotics  Review of Systems   Review of Systems  All other systems reviewed and are negative.   Physical Exam Updated Vital Signs BP (!) 160/88   Pulse 80   Temp 98.7  F (37.1 C) (Oral)   Resp 18   Ht 5' 1" (1.549 m)   Wt 83.9 kg   SpO2 99%   BMI 34.96 kg/m   Physical Exam Vitals and nursing note reviewed.  Constitutional:      General: She is not in acute distress.    Appearance: She is well-developed.  HENT:     Head: Normocephalic and atraumatic.     Mouth/Throat:     Comments: Pulsatile bleeding from right lower gumline at the premolars Eyes:     Conjunctiva/sclera: Conjunctivae normal.  Cardiovascular:     Rate and Rhythm: Normal rate.     Heart sounds: No murmur heard.   Pulmonary:     Effort: Pulmonary effort is normal. No respiratory distress.  Abdominal:     General: There is no distension.  Musculoskeletal:     Cervical back: Neck supple.     Comments: Moves all extremities  Skin:    General: Skin is warm and dry.  Neurological:     Mental Status: She is alert and oriented to person, place, and time.  Psychiatric:        Mood and Affect: Mood normal.        Behavior: Behavior normal.     ED Results / Procedures / Treatments   Labs (all labs ordered are listed, but only abnormal results are displayed) Labs Reviewed  CBC  PROTIME-INR    EKG None  Radiology No results found.  Procedures Procedures (including critical care time)  Medications Ordered in ED Medications  lidocaine-EPINEPHrine (XYLOCAINE W/EPI) 2 %-1:200000 (PF) injection 20 mL (20 mLs  Infiltration Handoff 08/20/20 0135)  tranexamic acid (CYKLOKAPRON) 1000 MG/10ML topical solution 500 mg (500 mg Topical Handoff 08/20/20 0135)    ED Course  I have reviewed the triage vital signs and the nursing notes.  Pertinent labs & imaging results that were available during my care of the patient were reviewed by me and considered in my medical decision making (see chart for details).    MDM Rules/Calculators/A&P                          Patient with bleeding from right inferior gums.  She is on antiplatelet agent.  She does have pulsatile bleeding during my exam.  TXA soaked gauze applied and firm pressure held.  Patient reassessed, bleeding controlled, will reassess in 10 minutes.  HGB is 12.3. PLT 260.  INR 1.0.  2:24 AM No further bleeding.   Final Clinical Impression(s) / ED Diagnoses Final diagnoses:  Bleeding gums    Rx / DC Orders ED Discharge Orders    None       Delaine Lame 08/20/20 [place] Newcomer, April, MD 08/20/20 0225

## 2020-08-21 ENCOUNTER — Telehealth: Payer: Self-pay

## 2020-08-21 NOTE — Telephone Encounter (Signed)
Ms Grave's left vm stating she was in the ED last pm for bleeding gums.  She wants to know if she should adjust her anagrelide.

## 2020-08-21 NOTE — Telephone Encounter (Signed)
I reviewed with Dr Burr Medico Ms Sara [name]' recent gum bleeding and treatment in the ED this am.  Per Dr Burr Medico Ms Sara [name] should continue current dose of anagrelide, she should stop the aspirin.   She states she hasn't been to the dentist in a long time and she does have receeding gums. Ms Sara [name] is very concerned with recent bruising on tongue and gum bleeding. Scheduling message sent.

## 2020-08-24 ENCOUNTER — Telehealth: Payer: Self-pay | Admitting: Hematology

## 2020-08-24 NOTE — Telephone Encounter (Signed)
Scheduled appt per 10/29 sch msg - pt is aware of appt date and time   

## 2020-08-31 ENCOUNTER — Encounter: Payer: Self-pay | Admitting: Internal Medicine

## 2020-09-02 NOTE — Progress Notes (Signed)
[facility]   [phone] [phone]   Clinic Follow up Note   Patient Care Team: [name], MD as PCP - General (Internal Medicine) Croitoru, Dani Gobble, MD as PCP - Cardiology (Cardiology)  Date of Service:  09/04/2020  CHIEF COMPLAINT:  follow up Essential Thrombocytosis  CURRENT THERAPY:  -Hydroxyurea 500 mg daily, started in 2010., increased to hydrea 1000mg  daily on Monday and Thursday, and 500mg  daily for the rest of week in 01/2015 -Aspirin 81 mg daily.  -Switched Hydrea to anagrelide1mg daily10/2017. Held and was on hydrea from 01/2019-5/2020due to Anagrelide on back order.Increase Anagrelide to 1mg  daily in AM and on MWF add 0.5mg  in the PM starting 01/03/20.  INTERVAL HISTORY:  Sara [name] is here for a follow up of ET. She presents to the clinic alone. She notes she is doing well. She notes when she stopped taking aspirin for bleeding, she noticed left knee pain. She has been using topical arthritis cream for this. She notes her energy is adequate. She notes she is working on lose weight. She has lost 20 pounds so far. She has been restricting her food intake. I reviewed her medication list with her. She is no longer on Tylenol. She is on Anagrelide 1mg  every morning and 0.5mg  at PM for MWF. She is on Vit D, B and Magnesium. She notes holding Lexapro and trying to manage her Depression on her own.    REVIEW OF SYSTEMS:   Constitutional: Denies fevers, chills (+) Purposeful weight loss Eyes: Denies blurriness of vision Ears, nose, mouth, throat, and face: Denies mucositis or sore throat Respiratory: Denies cough, dyspnea or wheezes Cardiovascular: Denies palpitation, chest discomfort or lower extremity swelling Gastrointestinal:  Denies nausea, heartburn or change in bowel habits Skin: Denies abnormal skin rashes MSK (+) Left knee pain, arthritis  Lymphatics: Denies new lymphadenopathy or easy bruising Neurological:Denies  numbness, tingling or new weaknesses Behavioral/Psych: Mood is stable, no new changes  All other systems were reviewed with the patient and are negative.  MEDICAL HISTORY:  Past Medical History:  Diagnosis Date  . Diabetes mellitus   . Gout   . Hyperlipemia   . Hypertension   . LVH (left ventricular hypertrophy)    12/21/09 Echo -mild asymmetric LVH,EF =>55%  . Morbid obesity ([place])   . OSA (obstructive sleep apnea)   . PAD (peripheral artery disease) ([place])   . Renal artery stenosis ([place]) 12/13/2010   PTCA and Stent - right  . Seizures ([place])   . Syncopal episodes   . Thrombocytosis     SURGICAL HISTORY: Past Surgical History:  Procedure Laterality Date  . BREAST CYST EXCISION     S/P Benign Right Breast Cyst Removal.  . BREAST REDUCTION SURGERY  1995   S/P Bilateral breast reduction  . [place]   Times  Two.  . CORONARY ARTERY BYPASS GRAFT N/A 04/21/2017   Procedure: CORONARY ARTERY BYPASS GRAFTING (CABG) x 2, USING LEFT MAMMARY ARTERY AND RIGHT GREATER SAPHENOUS VEIN HARVESTED ENDOSCOPICALLY;  Surgeon: [name], MD;  Location: [place];  Service: Open Heart Surgery;  Laterality: N/A;  . EXTREMITY CYST EXCISION  1993   left wrist  . LEFT HEART CATH AND CORONARY ANGIOGRAPHY N/A 04/17/2017   Procedure: Left Heart Cath and Coronary Angiography;  Surgeon: Jettie Booze, MD;  Location: [place] [facility];  Service: Cardiovascular;  Laterality: N/A;  . PARTIAL HYSTERECTOMY  1994  . renal artery stent placement Right   .  TEE WITHOUT CARDIOVERSION N/A 04/21/2017   Procedure: TRANSESOPHAGEAL ECHOCARDIOGRAM (TEE);  Surgeon: [name], MD;  Location: [place];  Service: Open Heart Surgery;  Laterality: N/A;    I have reviewed the social history and family history with the patient and they are unchanged from previous note.  ALLERGIES:  is allergic to sulfa antibiotics.  MEDICATIONS:  Current Outpatient Medications  Medication Sig Dispense Refill  .  anagrelide ([name]) 0.5 MG capsule Take 1 capsule (0.5 mg total) by mouth at bedtime. On Mondays, Wednesdays and Fridays 15 capsule 5  . anagrelide ([name]) 1 MG capsule Take 1 capsule (1 mg total) by mouth daily. 30 capsule 5  . aspirin EC 81 MG tablet Take 1 tablet (81 mg total) by mouth daily. Swallow whole. (Patient taking differently: Take 81 mg by mouth daily. 2 tabs at bedtime) 150 tablet 2  . atorvastatin (LIPITOR) 40 MG tablet TAKE 1 TABLET BY MOUTH DAILY AT 6 PM 90 tablet 3  . B Complex Vitamins (B COMPLEX PO) Take 1 tablet by mouth daily.     . Blood Glucose Monitoring Suppl (ONETOUCH VERIO IQ SYSTEM) w/Device KIT Use as directed to check blood sugars 1 time per day dx: e11.22 1 kit 1  . Blood Pressure Monitoring (BLOOD PRESSURE CUFF) MISC Check blood pressure 1-2 times a day 3 h after taking BP medication.  Dx- CAD        HTN. 1 each 0  . Cholecalciferol (VITAMIN D3) 2000 UNITS capsule Take 2,000 Units by mouth daily.     Marland Kitchen glucose blood (ONETOUCH VERIO) test strip USE AS DIRECTED TO TEST BLOOD SUGR LEVELS DAILY 150 strip 3  . hydrochlorothiazide (MICROZIDE) 12.5 MG capsule TAKE 1 CAPSULE(12.5 MG) BY MOUTH DAILY 90 capsule 3  . metoprolol tartrate (LOPRESSOR) 50 MG tablet TAKE 1 AND 1/2 TABLETS BY MOUTH TWICE DAILY 45 tablet 10  . OneTouch Delica Lancets 19E MISC USE AS DIRECTED TO CHECK BLOOD SUGAR LEVELS ONCE DAILY 200 each 2  . Specialty Vitamins Products (MAGNESIUM, AMINO ACID CHELATE,) 133 MG tablet Take 1 tablet by mouth 2 (two) times daily.    Marland Kitchen telmisartan (MICARDIS) 80 MG tablet TAKE 1 TABLET BY MOUTH EVERY DAY 90 tablet 1  . [name] 100 MG TABS Take 1 tablet in the morning, take 1.5 tablets in the evening 210 tablet 1   No current facility-administered medications for this visit.    PHYSICAL EXAMINATION: ECOG PERFORMANCE STATUS: 0 - Asymptomatic  Vitals:   09/04/20 1334  BP: 139/60  Pulse: 67  Resp: 18  Temp: 97.8 F (36.6 C)  SpO2: 97%   Filed Weights    09/04/20 1334  Weight: 184 lb 12.8 oz (83.8 kg)    Due to COVID19 we will limit examination to appearance. Patient had no complaints.  GENERAL:alert, no distress and comfortable SKIN: skin color normal, no rashes or significant lesions EYES: normal, Conjunctiva are pink and non-injected, sclera clear  NEURO: alert & oriented x 3 with fluent speech    LABORATORY DATA:  I have reviewed the data as listed CBC Latest Ref Rng & Units 09/04/2020 08/20/2020 08/03/2020  WBC 4.0 - 10.5 K/uL 8.6 11.0(H) 7.5  Hemoglobin 12.0 - 15.0 g/dL 11.8(L) 12.3 13.6  Hematocrit 36 - 46 % 34.3(L) 36.4 41.8  Platelets 150 - 400 K/uL 195 260 262     CMP Latest Ref Rng & Units 09/04/2020 08/03/2020 06/30/2020  Glucose 70 - 99 mg/dL 108(H) 95 88  BUN 8 - 23  mg/dL 15 19 13   Creatinine 0.44 - 1.00 mg/dL 1.33(H) 1.39(H) 1.22(H)  Sodium 135 - 145 mmol/L 126(L) 137 137  Potassium 3.5 - 5.1 mmol/L 3.6 3.8 4.4  Chloride 98 - 111 mmol/L 90(L) 101 99  CO2 22 - 32 mmol/L 31 32 27  Calcium 8.9 - 10.3 mg/dL 9.8 10.2 9.9  Total Protein 6.5 - 8.1 g/dL 6.8 6.9 6.6  Total Bilirubin 0.3 - 1.2 mg/dL 0.6 0.5 0.4  Alkaline Phos 38 - 126 U/L 82 82 91  AST 15 - 41 U/L 19 18 19   ALT 0 - 44 U/L 18 20 21       RADIOGRAPHIC STUDIES: I have personally reviewed the radiological images as listed and agreed with the findings in the report. No results found.   ASSESSMENT & PLAN:  RHIANN BOUCHER is a 64 y.o. female with    1. Essential Thrombocythemia. JAK2 (+) -Diagnosed in 2010. JAK2 mutation was present.Herbone marrow aspirate and biopsy on 01/20/2009 which yielded a specimen that was suboptimal for evaluation. However, megakaryocytes were abundant with normal morphology. There was no clustering or excess blasts seen. Platelet count on 01/20/2009 was 881. Sara [name] on Hydrea previously, had episodes of dizziness and syncope, and she was concerned about the risk of leukemia, so it was stopped. The patient is being treated  with aspirin and Anagrelide 1mg  in AM and on MWF add 0.5mg  in the PM.  -She is clinically dong well. Labs reviewed, Hg 11.8, Na 126, BG 108, Cr 1.33. Continue Anagrelide 1mg  in AM and on MWF add 0.5mg  in the PM. Continue aspirin.  -She had recent significant gum bleeding, which likely contributed to her anemia. I recommend she start multivitamin or prenatal vitamin. She is agreeable.  -Lab in 3 months and follow-up in 6 months  2.H/o ofSeizure,Recurrent Syncope episodes -Her Syncope have unclear etiology. -Shehas a heart monitor.  -continue meds, follow up with her neurologist.   4. Diabetes, HTN, Arthritis  -She has been seeing a nutritionist at church. -She monitors her sugar level regularly -She has recent arthritis in her left knee.  -continue meds and f/uwith PCP  5. Coronary artery disease -Coronary artery disease status post bypass surgery grafting x 2 by Dr. Cyndia Bent in June 2018. -Continue to f/u with cardiologist.    PLAN: -Continue anagrelide 1 mgdailyin the AM and on MWF additional 0.5mg  in the PM -Continue aspirin.  -lab in 3 months  -Lab and f/u in 42months   No problem-specific Assessment & Plan notes found for this encounter.   No orders of the defined types were placed in this encounter.  All questions were answered. The patient knows to call the clinic with any problems, questions or concerns. No barriers to learning was detected. The total time spent in the appointment was 20 minutes.     Truitt Merle, MD 09/04/2020   I, Joslyn Devon, am acting as scribe for Truitt Merle, MD.   I have reviewed the above documentation for accuracy and completeness, and I agree with the above.

## 2020-09-03 ENCOUNTER — Telehealth: Payer: Self-pay | Admitting: Adult Health

## 2020-09-03 NOTE — Telephone Encounter (Signed)
Hi [place],  My name is Hinton Dyer I can leave a form for Patient assistance for [name] at the front desk or I can mail you a form . Or you can submit a form on Line . Its up to you . The Name of the Company is [employer] . [phone]. Please start process as soon as you can because its takes about two weeks for approval . If you are approved 7-10 turn around time for delivery . Thanks So much [place] .

## 2020-09-04 ENCOUNTER — Other Ambulatory Visit: Payer: Self-pay

## 2020-09-04 ENCOUNTER — Inpatient Hospital Stay: Payer: Medicare Other | Attending: Nurse Practitioner | Admitting: Hematology

## 2020-09-04 ENCOUNTER — Inpatient Hospital Stay: Payer: Medicare Other

## 2020-09-04 DIAGNOSIS — D473 Essential (hemorrhagic) thrombocythemia: Secondary | ICD-10-CM

## 2020-09-04 DIAGNOSIS — I10 Essential (primary) hypertension: Secondary | ICD-10-CM | POA: Insufficient documentation

## 2020-09-04 DIAGNOSIS — M1712 Unilateral primary osteoarthritis, left knee: Secondary | ICD-10-CM | POA: Diagnosis not present

## 2020-09-04 DIAGNOSIS — Z882 Allergy status to sulfonamides status: Secondary | ICD-10-CM | POA: Diagnosis not present

## 2020-09-04 DIAGNOSIS — R634 Abnormal weight loss: Secondary | ICD-10-CM | POA: Diagnosis not present

## 2020-09-04 DIAGNOSIS — Z79899 Other long term (current) drug therapy: Secondary | ICD-10-CM | POA: Insufficient documentation

## 2020-09-04 DIAGNOSIS — R55 Syncope and collapse: Secondary | ICD-10-CM | POA: Diagnosis not present

## 2020-09-04 DIAGNOSIS — G4733 Obstructive sleep apnea (adult) (pediatric): Secondary | ICD-10-CM | POA: Insufficient documentation

## 2020-09-04 DIAGNOSIS — Z0000 Encounter for general adult medical examination without abnormal findings: Secondary | ICD-10-CM

## 2020-09-04 DIAGNOSIS — E119 Type 2 diabetes mellitus without complications: Secondary | ICD-10-CM | POA: Insufficient documentation

## 2020-09-04 DIAGNOSIS — F32A Depression, unspecified: Secondary | ICD-10-CM | POA: Diagnosis not present

## 2020-09-04 DIAGNOSIS — I2510 Atherosclerotic heart disease of native coronary artery without angina pectoris: Secondary | ICD-10-CM | POA: Diagnosis not present

## 2020-09-04 LAB — CBC WITH DIFFERENTIAL/PLATELET
Abs Immature Granulocytes: 0.05 10*3/uL (ref 0.00–0.07)
Basophils Absolute: 0.1 10*3/uL (ref 0.0–0.1)
Basophils Relative: 1 %
Eosinophils Absolute: 0.1 10*3/uL (ref 0.0–0.5)
Eosinophils Relative: 2 %
HCT: 34.3 % — ABNORMAL LOW (ref 36.0–46.0)
Hemoglobin: 11.8 g/dL — ABNORMAL LOW (ref 12.0–15.0)
Immature Granulocytes: 1 %
Lymphocytes Relative: 19 %
Lymphs Abs: 1.6 10*3/uL (ref 0.7–4.0)
MCH: 29.3 pg (ref 26.0–34.0)
MCHC: 34.4 g/dL (ref 30.0–36.0)
MCV: 85.1 fL (ref 80.0–100.0)
Monocytes Absolute: 0.6 10*3/uL (ref 0.1–1.0)
Monocytes Relative: 7 %
Neutro Abs: 6.1 10*3/uL (ref 1.7–7.7)
Neutrophils Relative %: 70 %
Platelets: 195 10*3/uL (ref 150–400)
RBC: 4.03 MIL/uL (ref 3.87–5.11)
RDW: 13.1 % (ref 11.5–15.5)
WBC: 8.6 10*3/uL (ref 4.0–10.5)
nRBC: 0 % (ref 0.0–0.2)

## 2020-09-04 LAB — COMPREHENSIVE METABOLIC PANEL
ALT: 18 U/L (ref 0–44)
AST: 19 U/L (ref 15–41)
Albumin: 3.4 g/dL — ABNORMAL LOW (ref 3.5–5.0)
Alkaline Phosphatase: 82 U/L (ref 38–126)
Anion gap: 5 (ref 5–15)
BUN: 15 mg/dL (ref 8–23)
CO2: 31 mmol/L (ref 22–32)
Calcium: 9.8 mg/dL (ref 8.9–10.3)
Chloride: 90 mmol/L — ABNORMAL LOW (ref 98–111)
Creatinine, Ser: 1.33 mg/dL — ABNORMAL HIGH (ref 0.44–1.00)
GFR, Estimated: 44 mL/min — ABNORMAL LOW (ref >60)
Glucose, Bld: 108 mg/dL — ABNORMAL HIGH (ref 70–99)
Potassium: 3.6 mmol/L (ref 3.5–5.1)
Sodium: 126 mmol/L — ABNORMAL LOW (ref 135–145)
Total Bilirubin: 0.6 mg/dL (ref 0.3–1.2)
Total Protein: 6.8 g/dL (ref 6.5–8.1)

## 2020-09-04 MED ORDER — ANAGRELIDE HCL 1 MG PO CAPS: 1.0000 mg | ORAL_CAPSULE | Freq: Every day | ORAL | 5 refills | Status: DC

## 2020-09-04 MED ORDER — ANAGRELIDE HCL 0.5 MG PO CAPS
0.5000 mg | ORAL_CAPSULE | Freq: Every evening | ORAL | 5 refills | Status: DC
Start: 2020-09-04 — End: 2021-01-22

## 2020-09-05 ENCOUNTER — Encounter: Payer: Self-pay | Admitting: Hematology

## 2020-09-09 NOTE — Telephone Encounter (Signed)
Form For [name] has mailed . Patient has to Renew on her own and we will do physician part and refill .

## 2020-09-11 ENCOUNTER — Other Ambulatory Visit: Payer: Self-pay

## 2020-09-11 ENCOUNTER — Encounter: Payer: Self-pay | Admitting: Hematology

## 2020-09-11 DIAGNOSIS — D473 Essential (hemorrhagic) thrombocythemia: Secondary | ICD-10-CM

## 2020-09-11 MED ORDER — ANAGRELIDE HCL 1 MG PO CAPS: 1.0000 mg | ORAL_CAPSULE | Freq: Every day | ORAL | 5 refills | Status: DC

## 2020-09-11 MED FILL — ANAGRELIDE HCL 1 MG CAPSULE: 1 | 30 days supply | Qty: 30 | Fill #0

## 2020-09-28 ENCOUNTER — Other Ambulatory Visit: Payer: Self-pay | Admitting: Cardiovascular Disease

## 2020-10-01 ENCOUNTER — Other Ambulatory Visit: Payer: BC Managed Care – PPO

## 2020-10-01 ENCOUNTER — Ambulatory Visit: Payer: BC Managed Care – PPO | Admitting: Nurse Practitioner

## 2020-10-20 ENCOUNTER — Telehealth: Payer: Self-pay | Admitting: Cardiovascular Disease

## 2020-10-20 DIAGNOSIS — Z0279 Encounter for issue of other medical certificate: Secondary | ICD-10-CM

## 2020-10-20 DIAGNOSIS — Z0289 Encounter for other administrative examinations: Secondary | ICD-10-CM

## 2020-10-20 NOTE — Telephone Encounter (Signed)
Patient came in office and dropped off Disability forms from [place] to be completed by Dr.Croitoru. Release and payment were completed and forms were put in Dr.Croitoru Box to be completed.

## 2020-10-21 ENCOUNTER — Telehealth: Payer: Self-pay | Admitting: Cardiovascular Disease

## 2020-10-21 NOTE — Telephone Encounter (Signed)
Patient relayed advice from MD. She will f/u tomorrow with an update

## 2020-10-21 NOTE — Telephone Encounter (Signed)
Pt c/o swelling: STAT is pt has developed SOB within 24 hours  1) How much weight have you gained and in what time span? Pt actually lost 1 lb  2) If swelling, where is the swelling located? L Calf  3) Are you currently taking a fluid pill? no  4) Are you currently SOB? no  5) Do you have a log of your daily weights (if so, list)?   6) Have you gained 3 pounds in a day or 5 pounds in a week? no  7) Have you traveled recently? No  Patient said she has been on her feet a lot and she has some pain in her leg in the back of her leg by her calf. It was the leg that she took a vein out of to do the surgery.

## 2020-10-21 NOTE — Telephone Encounter (Signed)
Returned call to patient of Dr. Salena [name] who c/o pain in her left lower leg/calf + left ankle swelling She has been busy on her feet much of last week She noticed pain in her left calf that started on Monday Patient reports a little pain to touch in certain areas on her left lower leg No discoloration or redness She took tylenol w/some relief yesterday She reports also trying to be more active This is the extremity where she had a vein harvest for CABG She was concerned about possible clot  Advised will notify MD of her concerns and we will follow up with any advice

## 2020-10-21 NOTE — Telephone Encounter (Signed)
Try to keep leg elevated for the rest of today. If no better by tomorrow, schedule for lower extremity Doppler please.

## 2020-10-22 ENCOUNTER — Telehealth: Payer: Self-pay | Admitting: Cardiovascular Disease

## 2020-10-22 NOTE — Telephone Encounter (Signed)
Called patient, she states that her leg is much better. Not swelling, no pain and no pain with walking, no redness, it is not warm. Did advise patient to continue to take it easy.  Patient verbalized understanding. Will call back with any other recommendations per MD.  Thank you!

## 2020-10-22 NOTE — Telephone Encounter (Signed)
Follow Up:     Pt said she was supposed to call back today and give an update on her leg. She said she thinks it is a little better. She said still have two small bruises, swelling is down some and still have some soreness in the back of her calf.

## 2020-10-24 ENCOUNTER — Encounter: Payer: Self-pay | Admitting: Hematology

## 2020-10-26 ENCOUNTER — Encounter: Payer: Self-pay | Admitting: Hematology

## 2020-10-27 ENCOUNTER — Telehealth: Payer: Self-pay | Admitting: Nurse Practitioner

## 2020-10-27 ENCOUNTER — Telehealth: Payer: Self-pay | Admitting: *Deleted

## 2020-10-27 NOTE — Telephone Encounter (Signed)
Scheduled apt per 1/4 schmsg - pt is aware of appt date and time

## 2020-10-27 NOTE — Telephone Encounter (Signed)
I received [employer] [place] Disability form for pt.  I do not see that we have done previously.  Per letter attached, need copy of all office notes.  And [employer] to completed certifying ongoing disability by provider who has done previously.

## 2020-10-28 ENCOUNTER — Ambulatory Visit (HOSPITAL_COMMUNITY): Payer: Medicare Other

## 2020-10-28 ENCOUNTER — Inpatient Hospital Stay: Payer: Medicare Other

## 2020-10-28 ENCOUNTER — Other Ambulatory Visit: Payer: Self-pay

## 2020-10-28 ENCOUNTER — Encounter: Payer: Self-pay | Admitting: Nurse Practitioner

## 2020-10-28 ENCOUNTER — Ambulatory Visit (HOSPITAL_COMMUNITY)
Admission: RE | Admit: 2020-10-28 | Discharge: 2020-10-28 | Disposition: A | Discharge status: Home / Self Care | Payer: Medicare Other | Source: Ambulatory Visit | Attending: Nurse Practitioner | Admitting: Nurse Practitioner

## 2020-10-28 ENCOUNTER — Inpatient Hospital Stay: Payer: Medicare Other | Attending: Nurse Practitioner | Admitting: Nurse Practitioner

## 2020-10-28 VITALS — BP 150/79 | HR 71 | Temp 97.1°F | Resp 17 | Ht 61.0 in | Wt 183.7 lb

## 2020-10-28 DIAGNOSIS — Z7982 Long term (current) use of aspirin: Secondary | ICD-10-CM | POA: Diagnosis not present

## 2020-10-28 DIAGNOSIS — E119 Type 2 diabetes mellitus without complications: Secondary | ICD-10-CM | POA: Insufficient documentation

## 2020-10-28 DIAGNOSIS — I2510 Atherosclerotic heart disease of native coronary artery without angina pectoris: Secondary | ICD-10-CM | POA: Diagnosis not present

## 2020-10-28 DIAGNOSIS — E785 Hyperlipidemia, unspecified: Secondary | ICD-10-CM | POA: Insufficient documentation

## 2020-10-28 DIAGNOSIS — R600 Localized edema: Secondary | ICD-10-CM

## 2020-10-28 DIAGNOSIS — Z79899 Other long term (current) drug therapy: Secondary | ICD-10-CM | POA: Insufficient documentation

## 2020-10-28 DIAGNOSIS — G4733 Obstructive sleep apnea (adult) (pediatric): Secondary | ICD-10-CM | POA: Insufficient documentation

## 2020-10-28 DIAGNOSIS — D473 Essential (hemorrhagic) thrombocythemia: Secondary | ICD-10-CM

## 2020-10-28 DIAGNOSIS — I10 Essential (primary) hypertension: Secondary | ICD-10-CM | POA: Insufficient documentation

## 2020-10-28 DIAGNOSIS — Z0000 Encounter for general adult medical examination without abnormal findings: Secondary | ICD-10-CM

## 2020-10-28 LAB — CBC WITH DIFFERENTIAL/PLATELET
Abs Immature Granulocytes: 0.06 10*3/uL (ref 0.00–0.07)
Basophils Absolute: 0.1 10*3/uL (ref 0.0–0.1)
Basophils Relative: 1 %
Eosinophils Absolute: 0.3 10*3/uL (ref 0.0–0.5)
Eosinophils Relative: 3 %
HCT: 38.7 % (ref 36.0–46.0)
Hemoglobin: 13.2 g/dL (ref 12.0–15.0)
Immature Granulocytes: 1 %
Lymphocytes Relative: 15 %
Lymphs Abs: 1.6 10*3/uL (ref 0.7–4.0)
MCH: 29.3 pg (ref 26.0–34.0)
MCHC: 34.1 g/dL (ref 30.0–36.0)
MCV: 85.8 fL (ref 80.0–100.0)
Monocytes Absolute: 0.6 10*3/uL (ref 0.1–1.0)
Monocytes Relative: 6 %
Neutro Abs: 7.7 10*3/uL (ref 1.7–7.7)
Neutrophils Relative %: 74 %
Platelets: 316 10*3/uL (ref 150–400)
RBC: 4.51 MIL/uL (ref 3.87–5.11)
RDW: 12.8 % (ref 11.5–15.5)
WBC: 10.3 10*3/uL (ref 4.0–10.5)
nRBC: 0 % (ref 0.0–0.2)

## 2020-10-28 NOTE — Progress Notes (Signed)
[place]   [phone] [phone]   Clinic Follow up Note   Patient Care Team: [name], MD as PCP - General (Internal Medicine) Sanda Klein, MD as PCP - Cardiology (Cardiology) 10/28/2020  CHIEF COMPLAINT: Left leg swelling, calf pain, redness; followed for thrombocytosis   CURRENT THERAPY:   -Hydroxyurea 500 mg daily, started in 2010., increased to hydrea 107m daily on Monday and Thursday, and 5074mdaily for the rest of week in 01/2015 -Aspirin 81 mg daily.  -Switched Hydrea to anagrelide1m36mily10/2017. Held and was on hydrea from 01/2019-5/2020due to Anagrelide on back order.Increase Anagrelide to 1mg68mily in AM and on MWF add 0.5mg 30mthe PM starting 01/03/20.  INTERVAL HISTORY: Ms. GraveHardimanents for work in visit for new onset left leg edema, calf pain, and bruising which began on/around 10/21/2020.  She had been more active and on her feet the week prior.  Denies injury/fall, prolonged travel, rash, infection, or adding salt.  She continue anagrelide and aspirin. She spoke to a friend who is a nurse who told her to rest the leg which she has done, bruising and pain improved but swelling has not.  She initially thought this was related to arthritis in the knee and she use Tylenol and put alcohol on it, this did help some. Denies cough, chest pain, dyspnea, other bleeding or new issues.    MEDICAL HISTORY:  Past Medical History:  Diagnosis Date  . Diabetes mellitus   . Gout   . Hyperlipemia   . Hypertension   . LVH (left ventricular hypertrophy)    12/21/09 Echo -mild asymmetric LVH,EF =>55%  . Morbid obesity (HCC) [place] OSA (obstructive sleep apnea)   . PAD (peripheral artery disease) (HCC) [place] Renal artery stenosis (HCC) [place]0/2012   PTCA and Stent - right  . Seizures (HCC) [place] Syncopal episodes   . Thrombocytosis     SURGICAL HISTORY: Past Surgical History:  Procedure Laterality Date  . BREAST CYST EXCISION      S/P Benign Right Breast Cyst Removal.  . BREAST REDUCTION SURGERY  1995   S/P Bilateral breast reduction  . [place]  Two.  . CORONARY ARTERY BYPASS GRAFT N/A 04/21/2017   Procedure: CORONARY ARTERY BYPASS GRAFTING (CABG) x 2, USING LEFT MAMMARY ARTERY AND RIGHT GREATER SAPHENOUS VEIN HARVESTED ENDOSCOPICALLY;  Surgeon: BartlGaye Pollack  [facility];  Laterality: N/A;  . EXTREMITY CYST EXCISION  1993   left wrist  . LEFT HEART CATH AND CORONARY ANGIOGRAPHY N/A 04/17/2017   Procedure: Left Heart Cath and Coronary Angiography;  Surgeon: VaranJettie Booze  [facility];  Service: Cardiovascular;  Laterality: N/A;  . PARTIAL HYSTERECTOMY  1994  . renal artery stent placement Right   . TEE WITHOUT CARDIOVERSION N/A 04/21/2017   Procedure: TRANSESOPHAGEAL ECHOCARDIOGRAM (TEE);  Surgeon: BartlGaye Pollack  [facility];  Laterality: N/A;    I have reviewed the social history and family history with the patient and they are unchanged from previous note.  ALLERGIES:  is allergic to sulfa antibiotics.  MEDICATIONS:  Current Outpatient Medications  Medication Sig Dispense Refill  . anagrelide ([name]) 0.5 MG capsule Take 1 capsule (0.5 mg total) by mouth at bedtime. On Mondays, Wednesdays and Fridays 15 capsule 5  . anagrelide ([name]) 1 MG capsule Take 1 capsule (1 mg  total) by mouth daily. 30 capsule 5  . aspirin EC 81 MG tablet Take 1 tablet (81 mg total) by mouth daily. Swallow whole. (Patient taking differently: Take 81 mg by mouth daily. 2 tabs at bedtime) 150 tablet 2  . atorvastatin (LIPITOR) 40 MG tablet TAKE 1 TABLET BY MOUTH DAILY AT 6 PM 90 tablet 2  . B Complex Vitamins (B COMPLEX PO) Take 1 tablet by mouth daily.     . Blood Glucose Monitoring Suppl (ONETOUCH VERIO IQ SYSTEM) w/Device KIT Use as directed to check blood sugars 1 time per day dx: e11.22 1 kit 1   . Blood Pressure Monitoring (BLOOD PRESSURE CUFF) MISC Check blood pressure 1-2 times a day 3 h after taking BP medication.  Dx- CAD        HTN. 1 each 0  . Cholecalciferol (VITAMIN D3) 2000 UNITS capsule Take 2,000 Units by mouth daily.     Marland Kitchen glucose blood (ONETOUCH VERIO) test strip USE AS DIRECTED TO TEST BLOOD SUGR LEVELS DAILY 150 strip 3  . hydrochlorothiazide (MICROZIDE) 12.5 MG capsule TAKE 1 CAPSULE(12.5 MG) BY MOUTH DAILY 90 capsule 3  . metoprolol tartrate (LOPRESSOR) 50 MG tablet TAKE 1 AND 1/2 TABLETS BY MOUTH TWICE DAILY 45 tablet 10  . OneTouch Delica Lancets 44H MISC USE AS DIRECTED TO CHECK BLOOD SUGAR LEVELS ONCE DAILY 200 each 2  . Specialty Vitamins Products (MAGNESIUM, AMINO ACID CHELATE,) 133 MG tablet Take 1 tablet by mouth 2 (two) times daily.    Marland Kitchen telmisartan (MICARDIS) 80 MG tablet TAKE 1 TABLET BY MOUTH EVERY DAY 90 tablet 1  . [name] 100 MG TABS Take 1 tablet in the morning, take 1.5 tablets in the evening 210 tablet 1   No current facility-administered medications for this visit.    PHYSICAL EXAMINATION:  Vitals:   10/28/20 1022  BP: (!) 150/79  Pulse: 71  Resp: 17  Temp: (!) 97.1 F (36.2 C)  SpO2: 100%   Filed Weights   10/28/20 1022  Weight: 183 lb 11.2 oz (83.3 kg)    GENERAL:alert, no distress and comfortable SKIN: no rash  EYES: sclera clear LUNGS: clear with normal breathing effort HEART: regular rate & rhythm, left > right lower leg edema, ecchymosis in the posterior calf with fullness. No erythema or warmth   NEURO: alert & oriented x 3 with fluent speech, no focal motor/sensory deficits   LABORATORY DATA:  I have reviewed the data as listed CBC Latest Ref Rng & Units 10/28/2020 09/04/2020 08/20/2020  WBC 4.0 - 10.5 K/uL 10.3 8.6 11.0(H)  Hemoglobin 12.0 - 15.0 g/dL 13.2 11.8(L) 12.3  Hematocrit 36.0 - 46.0 % 38.7 34.3(L) 36.4  Platelets 150 - 400 K/uL 316 195 260     RADIOGRAPHIC STUDIES: I have personally reviewed the  radiological images as listed and agreed with the findings in the report. VAS [place] LOWER EXTREMITY VENOUS (DVT)  Result Date: 10/28/2020  Lower Venous DVT Study Indications: Swelling.  Risk Factors: Cancer. Limitations: Poor ultrasound/tissue interface. Comparison Study: No prior studies. Performing Technologist: Oliver Hum RVT  Examination Guidelines: A complete evaluation includes B-mode imaging, spectral Doppler, color Doppler, and power Doppler as needed of all accessible portions of each vessel. Bilateral testing is considered an integral part of a complete examination. Limited examinations for reoccurring indications may be performed as noted. The reflux portion of the exam is performed with the patient in reverse Trendelenburg.  +---------+---------------+---------+-----------+----------+--------------+ RIGHT    CompressibilityPhasicitySpontaneityPropertiesThrombus Aging +---------+---------------+---------+-----------+----------+--------------+ CFV  Full           Yes      Yes                                 +---------+---------------+---------+-----------+----------+--------------+ SFJ      Full                                                        +---------+---------------+---------+-----------+----------+--------------+ FV Prox  Full                                                        +---------+---------------+---------+-----------+----------+--------------+ FV Mid   Full                                                        +---------+---------------+---------+-----------+----------+--------------+ FV DistalFull                                                        +---------+---------------+---------+-----------+----------+--------------+ PFV      Full                                                        +---------+---------------+---------+-----------+----------+--------------+ POP      Full           Yes      Yes                                  +---------+---------------+---------+-----------+----------+--------------+ PTV      Full                                                        +---------+---------------+---------+-----------+----------+--------------+ PERO     Full                                                        +---------+---------------+---------+-----------+----------+--------------+   +---------+---------------+---------+-----------+----------+--------------+ LEFT     CompressibilityPhasicitySpontaneityPropertiesThrombus Aging +---------+---------------+---------+-----------+----------+--------------+ CFV      Full           Yes      Yes                                 +---------+---------------+---------+-----------+----------+--------------+  SFJ      Full                                                        +---------+---------------+---------+-----------+----------+--------------+ FV Prox  Full                                                        +---------+---------------+---------+-----------+----------+--------------+ FV Mid   Full                                                        +---------+---------------+---------+-----------+----------+--------------+ FV DistalFull                                                        +---------+---------------+---------+-----------+----------+--------------+ PFV      Full                                                        +---------+---------------+---------+-----------+----------+--------------+ POP      Full           Yes      Yes                                 +---------+---------------+---------+-----------+----------+--------------+ PTV      Full                                                        +---------+---------------+---------+-----------+----------+--------------+ PERO     Full                                                         +---------+---------------+---------+-----------+----------+--------------+     Summary: RIGHT: - There is no evidence of deep vein thrombosis in the lower extremity.  - No cystic structure found in the popliteal fossa.  LEFT: - There is no evidence of deep vein thrombosis in the lower extremity.  - An area of mixed echogenicity is noted within the popliteal fossa and extends into the proximal calf.  *See table(s) above for measurements and observations.    Preliminary      ASSESSMENT & PLAN: 65 yo female with ET  1. Left leg edema, pain -she developed acute LLE swelling and pain in 09/2020, she is on anagrelide which can cause edema -DVT  risk factor includes thrombocytosis and h/o bypass grafting in 2018.  -STAT doppler today shows ruptured baker's cyst in popliteal fossa extending to proximal calf. No DVT -we discussed symptom management including elevation, compression stockings, etc.  2. Essential Thrombocytosis  -CBC reviewed, WNL -continue anagrelide 1 mg daily plus additional 0.5 mg MWF  -lab 11/2020 and lab/follow up 02/2021 as scheduled.   PLAN: -Lab, doppler reviewed -No DVT, supportive care for ruptured bakers cyst  -Continue anagrelide as prescribed  -Lab 2/22, lab/follow up 02/2021  All questions were answered. The patient knows to call the clinic with any problems, questions or concerns. No barriers to learning were detected. Total encounter time was 30 minutes.      Alla Feeling, NP 10/28/20

## 2020-10-28 NOTE — Progress Notes (Signed)
Bilateral lower extremity venous duplex has been completed. Preliminary results can be found in CV Proc through chart review.  Results were given to Santiago Glad NP.  10/28/20 3:05 PM Olen Cordial RVT

## 2020-11-04 ENCOUNTER — Other Ambulatory Visit: Payer: Self-pay

## 2020-11-04 ENCOUNTER — Ambulatory Visit (INDEPENDENT_AMBULATORY_CARE_PROVIDER_SITE_OTHER): Payer: Medicare Other | Admitting: Internal Medicine

## 2020-11-04 ENCOUNTER — Encounter: Payer: Self-pay | Admitting: Internal Medicine

## 2020-11-04 VITALS — BP 110/70 | HR 79 | Temp 98.1°F | Ht 61.0 in | Wt 179.8 lb

## 2020-11-04 DIAGNOSIS — I130 Hypertensive heart and chronic kidney disease with heart failure and stage 1 through stage 4 chronic kidney disease, or unspecified chronic kidney disease: Secondary | ICD-10-CM

## 2020-11-04 DIAGNOSIS — Z23 Encounter for immunization: Secondary | ICD-10-CM | POA: Diagnosis not present

## 2020-11-04 DIAGNOSIS — N183 Chronic kidney disease, stage 3 unspecified: Secondary | ICD-10-CM | POA: Diagnosis not present

## 2020-11-04 DIAGNOSIS — I701 Atherosclerosis of renal artery: Secondary | ICD-10-CM

## 2020-11-04 DIAGNOSIS — I5032 Chronic diastolic (congestive) heart failure: Secondary | ICD-10-CM | POA: Diagnosis not present

## 2020-11-04 DIAGNOSIS — E1122 Type 2 diabetes mellitus with diabetic chronic kidney disease: Secondary | ICD-10-CM | POA: Diagnosis not present

## 2020-11-04 DIAGNOSIS — I1310 Hypertensive heart and chronic kidney disease without heart failure, with stage 1 through stage 4 chronic kidney disease, or unspecified chronic kidney disease: Secondary | ICD-10-CM

## 2020-11-04 DIAGNOSIS — Z0000 Encounter for general adult medical examination without abnormal findings: Secondary | ICD-10-CM | POA: Diagnosis not present

## 2020-11-04 DIAGNOSIS — R600 Localized edema: Secondary | ICD-10-CM

## 2020-11-04 DIAGNOSIS — N1831 Chronic kidney disease, stage 3a: Secondary | ICD-10-CM | POA: Diagnosis not present

## 2020-11-04 DIAGNOSIS — R569 Unspecified convulsions: Secondary | ICD-10-CM

## 2020-11-04 DIAGNOSIS — I471 Supraventricular tachycardia: Secondary | ICD-10-CM | POA: Insufficient documentation

## 2020-11-04 LAB — POCT UA - MICROALBUMIN
Albumin/Creatinine Ratio, Urine, POC: <30
Creatinine, POC: 100 mg/dL
Microalbumin Ur, POC: 10 mg/L

## 2020-11-04 LAB — POCT URINALYSIS DIPSTICK
Bilirubin, UA: NEGATIVE
Blood, UA: NEGATIVE
Glucose, UA: NEGATIVE
Ketones, UA: NEGATIVE
Leukocytes, UA: NEGATIVE
Nitrite, UA: NEGATIVE
Protein, UA: NEGATIVE
Spec Grav, UA: 1.015 (ref 1.010–1.025)
Urobilinogen, UA: 0.2 E.U./dL
pH, UA: 7 (ref 5.0–8.0)

## 2020-11-04 NOTE — Progress Notes (Addendum)
I,Sara [name],acting as a Education administrator for Sara Greenland, MD.,have documented all relevant documentation on the behalf of Sara Greenland, MD,as directed by  Sara Greenland, MD while in the presence of Sara Greenland, MD.   Subjective:    Sara [name] is a 65 y.o. female who presents for a Welcome to Medicare exam.   Diabetes She presents for her follow-up diabetic visit. She has type 2 diabetes mellitus. There are no hypoglycemic associated symptoms. There are no diabetic associated symptoms. Pertinent negatives for diabetes include no blurred vision and no chest pain. There are no hypoglycemic complications. Diabetic complications include heart disease and nephropathy. Risk factors for coronary artery disease include diabetes mellitus, dyslipidemia, hypertension, obesity, post-menopausal and sedentary lifestyle. She is compliant with treatment most of the time. She is following a diabetic diet. She participates in exercise three times a week. Her breakfast blood glucose is taken between 7-8 am. Her breakfast blood glucose range is generally 90-110 mg/dl. An ACE inhibitor/angiotensin II receptor blocker is being taken. Eye exam is current.  Hypertension This is a chronic problem. The current episode started more than 1 year ago. The problem has been gradually improving since onset. The problem is uncontrolled. Pertinent negatives include no blurred vision, chest pain, palpitations or shortness of breath. The current treatment provides moderate improvement. Compliance problems include exercise.  Hypertensive end-organ damage includes CAD/MI.     Review of Systems Review of Systems  Constitutional: Negative.   HENT: Negative.   Eyes: Negative.  Negative for blurred vision.  Respiratory: Negative.  Negative for shortness of breath.   Cardiovascular: Negative.  Negative for chest pain and palpitations.  Gastrointestinal: Negative.   Genitourinary: Negative.   Musculoskeletal: Negative.    Skin: Negative.   Neurological: Negative.   Endo/Heme/Allergies: Negative.   Psychiatric/Behavioral: Negative.    Cardiac Risk Factors include: hypertension;advanced age (>69men, >34 women);diabetes mellitus Personal history of heart disease s/p CABG, postmenopausal, ethnicity     Objective:    Today's Vitals   11/04/20 1509  BP: 110/70  Pulse: 79  Temp: 98.1 F (36.7 C)  TempSrc: Oral  Weight: 179 lb 12.8 oz (81.6 kg)  Height: $Remove'5\' 1"'vvEfCZH$  (1.549 m)  PainSc: 7   PainLoc: Knee  Body mass index is 33.97 kg/m.  Wt Readings from Last 3 Encounters:  11/04/20 179 lb 12.8 oz (81.6 kg)  10/28/20 183 lb 11.2 oz (83.3 kg)  09/04/20 184 lb 12.8 oz (83.8 kg)   Medications Outpatient Encounter Medications as of 11/04/2020  Medication Sig  . anagrelide ([name]) 0.5 MG capsule Take 1 capsule (0.5 mg total) by mouth at bedtime. On Mondays, Wednesdays and Fridays  . anagrelide ([name]) 1 MG capsule Take 1 capsule (1 mg total) by mouth daily.  Marland Kitchen aspirin EC 81 MG tablet Take 1 tablet (81 mg total) by mouth daily. Swallow whole. (Patient taking differently: Take 81 mg by mouth daily. 2 tabs at bedtime)  . atorvastatin (LIPITOR) 40 MG tablet TAKE 1 TABLET BY MOUTH DAILY AT 6 PM  . B Complex Vitamins (B COMPLEX PO) Take 1 tablet by mouth daily.   . Blood Glucose Monitoring Suppl (ONETOUCH VERIO IQ SYSTEM) w/Device KIT Use as directed to check blood sugars 1 time per day dx: e11.22  . Blood Pressure Monitoring (BLOOD PRESSURE CUFF) MISC Check blood pressure 1-2 times a day 3 h after taking BP medication.  Dx- CAD        HTN.  Marland Kitchen Cholecalciferol (VITAMIN D3) 2000 UNITS  capsule Take 2,000 Units by mouth daily.   Marland Kitchen glucose blood (ONETOUCH VERIO) test strip USE AS DIRECTED TO TEST BLOOD SUGR LEVELS DAILY  . hydrochlorothiazide (MICROZIDE) 12.5 MG capsule TAKE 1 CAPSULE(12.5 MG) BY MOUTH DAILY  . metoprolol tartrate (LOPRESSOR) 50 MG tablet TAKE 1 AND 1/2 TABLETS BY MOUTH TWICE DAILY  . OneTouch Delica  Lancets 42A MISC USE AS DIRECTED TO CHECK BLOOD SUGAR LEVELS ONCE DAILY  . Specialty Vitamins Products (MAGNESIUM, AMINO ACID CHELATE,) 133 MG tablet Take 1 tablet by mouth 2 (two) times daily.  Marland Kitchen telmisartan (MICARDIS) 80 MG tablet TAKE 1 TABLET BY MOUTH EVERY DAY  . [DISCONTINUED] [name] 100 MG TABS Take 1 tablet in the morning, take 1.5 tablets in the evening   No facility-administered encounter medications on file as of 11/04/2020.     History: Past Medical History:  Diagnosis Date  . Diabetes mellitus   . Gout   . Hyperlipemia   . Hypertension   . LVH (left ventricular hypertrophy)    12/21/09 Echo -mild asymmetric LVH,EF =>55%  . Morbid obesity ([place])   . OSA (obstructive sleep apnea)   . PAD (peripheral artery disease) ([place])   . Renal artery stenosis ([place]) 12/13/2010   PTCA and Stent - right  . Seizures ([place])   . Syncopal episodes   . Thrombocytosis    Past Surgical History:  Procedure Laterality Date  . BREAST CYST EXCISION     S/P Benign Right Breast Cyst Removal.  . BREAST REDUCTION SURGERY  1995   S/P Bilateral breast reduction  . [place]   Times  Two.  . CORONARY ARTERY BYPASS GRAFT N/A 04/21/2017   Procedure: CORONARY ARTERY BYPASS GRAFTING (CABG) x 2, USING LEFT MAMMARY ARTERY AND RIGHT GREATER SAPHENOUS VEIN HARVESTED ENDOSCOPICALLY;  Surgeon: [name], MD;  Location: [facility];  Service: Open Heart Surgery;  Laterality: N/A;  . EXTREMITY CYST EXCISION  1993   left wrist  . LEFT HEART CATH AND CORONARY ANGIOGRAPHY N/A 04/17/2017   Procedure: Left Heart Cath and Coronary Angiography;  Surgeon: Jettie Booze, MD;  Location: [facility];  Service: Cardiovascular;  Laterality: N/A;  . PARTIAL HYSTERECTOMY  1994  . renal artery stent placement Right   . TEE WITHOUT CARDIOVERSION N/A 04/21/2017   Procedure: TRANSESOPHAGEAL ECHOCARDIOGRAM (TEE);  Surgeon: [name], MD;  Location: [facility];  Service: Open Heart Surgery;  Laterality:  N/A;    Family History  Problem Relation Age of Onset  . Heart failure Mother   . Diabetes Father   . Diabetes Sister   . Hypertension Sister   . Hypertension Sister   . Hypertension Sister   . Breast cancer Maternal Aunt        2 maternal aunts had breast cancer.  . Breast cancer Maternal Grandmother    Social History   Occupational History  . Occupation: TRANSPORTATION COOR.    Employer: [employer]  Tobacco Use  . Smoking status: Never Smoker  . Smokeless tobacco: Never Used  Vaping Use  . Vaping Use: Never used  Substance and Sexual Activity  . Alcohol use: No    Alcohol/week: 0.0 standard drinks  . Drug use: No  . Sexual activity: Yes    Tobacco Counseling Counseling given: Not Answered   Immunizations and Health Maintenance Immunization History  Administered Date(s) Administered  . Fluad Quad(high Dose 65+) 11/04/2020  . Influenza,inj,Quad PF,6+ Mos 07/09/2019  . PFIZER(Purple Top)SARS-COV-2 Vaccination 11/30/2019,  12/23/2019, 08/10/2020  . Tdap 03/13/2019   Health Maintenance Due  Topic Date Due  . PNA vac Low Risk Adult (1 of 2 - PCV13) Never done  . FOOT EXAM  03/12/2020    Activities of Daily Living In your present state of health, do you have any difficulty performing the following activities: 11/04/2020  Hearing? N  Vision? N  Difficulty concentrating or making decisions? N  Walking or climbing stairs? N  Dressing or bathing? N  Doing errands, shopping? N  Preparing Food and eating ? N  Using the Toilet? N  In the past six months, have you accidently leaked urine? N  Do you have problems with loss of bowel control? N  Managing your Medications? N  Managing your Finances? N  Housekeeping or managing your Housekeeping? N  Some recent data might be hidden    Physical Exam   Physical Exam Vitals and nursing note reviewed.  Constitutional:      Appearance: Normal appearance. She is obese.     Comments: She elected to keep her  clothes on.   HENT:     Head: Normocephalic and atraumatic.     Right Ear: Tympanic membrane, ear canal and external ear normal.     Left Ear: Tympanic membrane, ear canal and external ear normal.     Nose:     Comments: Deferred, masked    Mouth/Throat:     Comments: Deferred, masked Eyes:     Extraocular Movements: Extraocular movements intact.     Conjunctiva/sclera: Conjunctivae normal.     Pupils: Pupils are equal, round, and reactive to light.  Cardiovascular:     Rate and Rhythm: Normal rate and regular rhythm.     Heart sounds: Normal heart sounds.  Pulmonary:     Effort: Pulmonary effort is normal.  Chest:  Breasts:     Tanner Score is 5.      Comments: Not examined, fully clothed Abdominal:     General: Bowel sounds are normal.     Palpations: Abdomen is soft.  Genitourinary:    Comments: Deferred Musculoskeletal:     Cervical back: Normal range of motion.     Left lower leg: Edema present.     Comments: LLE 16.25/RLE 14.5  Skin:    General: Skin is warm.  Neurological:     General: No focal deficit present.     Mental Status: She is alert and oriented to person, place, and time.  Psychiatric:        Mood and Affect: Mood and affect normal.        Behavior: Behavior normal.    (optional), or other factors deemed appropriate based on the beneficiary's medical and social history and current clinical standards.  Advanced Directives: Does Patient Have a Medical Advance Directive?: No Would patient like information on creating a medical advance directive?: No - Patient declined    Assessment:    This is a routine wellness examination for this patient .  Vision/Hearing screen  Hearing Screening   Method: Audiometry   125Hz  250Hz  500Hz  1000Hz  2000Hz  3000Hz  4000Hz  6000Hz  8000Hz   Right ear:  Pass Pass Pass Pass  Pass    Left ear:  Pass Pass Pass Pass  Pass      Visual Acuity Screening   Right eye Left eye Both eyes  Without correction: 20/50 20/50 20/30    With correction:       Dietary issues and exercise activities discussed:  Current Exercise Habits: Home exercise routine,  Type of exercise: walking, Time (Minutes): 30, Frequency (Times/Week): 3, Weekly Exercise (Minutes/Week): 90, Intensity: Moderate, Exercise limited by: cardiac condition(s)  Goals    . Exercise 150 min/wk Moderate Activity      Depression Screen PHQ 2/9 Scores 11/04/2020 09/02/2019 03/13/2019 12/24/2018  PHQ - 2 Score 0 0 0 2  PHQ- 9 Score - - - 8     Fall Risk Fall Risk  11/04/2020  Falls in the past year? 0    Cognitive Function: MMSE - Mini Mental State Exam 07/23/2019  Orientation to time 5  Orientation to Place 5  Registration 3  Attention/ Calculation 5  Recall 2  Language- name 2 objects 2  Language- repeat 1  Language- follow 3 step command 3  Language- read & follow direction 1  Write a sentence 1  Copy design 1  Total score 29     6CIT Screen 11/04/2020  What Year? 0 points  What month? 0 points  What time? 0 points  Count back from 20 0 points  Months in reverse 0 points  Repeat phrase 2 points  Total Score 2    Patient Care Team: Dorothyann Peng, MD as PCP - General (Internal Medicine) Thurmon Fair, MD as PCP - Cardiology (Cardiology)     Plan:    1. Encounter for Medicare annual wellness exam Comments:  The annual wellness visit was performed including discussion of advanced directives, assessment of functional status and cognitive function.  A full exam was performed. Importance of monthly self breast exams was stressed to the patient. EKG performed, NSR w/o acute changes. PATIENT IS ADVISED TO GET 30-45 MINUTES REGULAR EXERCISE NO LESS THAN FOUR TO FIVE DAYS PER WEEK - BOTH WEIGHTBEARING EXERCISES AND AEROBIC ARE RECOMMENDED.  PATIENT IS ADVISED TO FOLLOW A HEALTHY DIET WITH AT LEAST SIX FRUITS/VEGGIES PER DAY, DECREASE INTAKE OF RED MEAT, AND TO INCREASE FISH INTAKE TO TWO DAYS PER WEEK.  MEATS/FISH SHOULD NOT BE FRIED, BAKED OR  BROILED IS PREFERABLE.  I SUGGEST WEARING SPF 50 SUNSCREEN ON EXPOSED PARTS AND ESPECIALLY WHEN IN THE DIRECT SUNLIGHT FOR AN EXTENDED PERIOD OF TIME.  PLEASE AVOID FAST FOOD RESTAURANTS AND INCREASE YOUR WATER INTAKE.  2. Type 2 diabetes mellitus with stage 3 chronic kidney disease, without long-term current use of insulin, unspecified whether stage 3a or 3b CKD (HCC) Comments: Diabetic foot exam was NOT performed. Her hba1c has been controlled without meds. She was congratulated on maintaining her lifestyle changes.   I DISCUSSED WITH THE PATIENT AT LENGTH REGARDING THE GOALS OF GLYCEMIC CONTROL AND POSSIBLE LONG-TERM COMPLICATIONS.  I  ALSO STRESSED THE IMPORTANCE OF COMPLIANCE WITH HOME GLUCOSE MONITORING, DIETARY RESTRICTIONS INCLUDING AVOIDANCE OF SUGARY DRINKS/PROCESSED FOODS,  ALONG WITH REGULAR EXERCISE.  I  ALSO STRESSED THE IMPORTANCE OF ANNUAL EYE EXAMS, SELF FOOT CARE AND COMPLIANCE WITH OFFICE VISITS.  - POCT Urinalysis Dipstick (81002) - POCT UA - Microalbumin - CMP14+EGFR - Lipid panel - Hemoglobin A1c  3. Hypertensive heart and renal disease with renal failure, stage 1 through stage 4 or unspecified chronic kidney disease, with heart failure (HCC) Comments: Chronic, well controlled. EKG performed, NSR w/o acute changes. She is encouraged to follow low sodium diet.  She is encouraged to aim for at least 150 minutes of exercise per week.  She will f/u in six months.  - EKG 12-Lead - CMP14+EGFR  4. Chronic diastolic congestive heart failure (HCC) Comments: Chronic, yet stable. Encouraged to follow low sodium diet.   5. Renal  artery stenosis (HCC) Comments: s/p renal artery stent placement  6. Seizures ([place]) Comments: Chronic, she has h/o absence seizures. She denies recent epileptic activity.  7. Need for vaccination Comments: She was given high dose flu vaccine.  - Flu Vaccine QUAD High Dose(Fluad)  8. Left lower extremity edema Lower ext doppler results from 10/28/20  reviewed in detail: ruptured baker's cyst in popliteal fossa extending to proximal calf. No DVT She understands to wear compression hose and elevate leg when seated.   I have personally reviewed and noted the following in the patient's chart:   . Medical and social history . Use of alcohol, tobacco or illicit drugs  . Current medications and supplements . Functional ability and status . Nutritional status . Physical activity . Advanced directives . List of other physicians . Hospitalizations, surgeries, and ER visits in previous 12 months . Vitals . Screenings to include cognitive, depression, and falls . Referrals and appointments  In addition, I have reviewed and discussed with patient certain preventive protocols, quality metrics, and best practice recommendations. A written personalized care plan for preventive services as well as general preventive health recommendations were provided to patient.     Sara Greenland, MD 11/15/2020

## 2020-11-04 NOTE — Patient Instructions (Signed)
  Sara [name] , Thank you for taking time to come for your Medicare Wellness Visit. I appreciate your ongoing commitment to your health goals. Please review the following plan we discussed and let me know if I can assist you in the future.   These are the goals we discussed: Goals    . Exercise 150 min/wk Moderate Activity       This is a list of the screening recommended for you and due dates:  Health Maintenance  Topic Date Due  . Pneumonia vaccines (1 of 2 - PCV13) Never done  . Complete foot exam   03/12/2020  . Hemoglobin A1C  12/28/2020  . COVID-19 Vaccine (4 - Booster for Pfizer series) 02/08/2021  . Eye exam for diabetics  04/14/2021  . Mammogram  04/24/2022  . Colon Cancer Screening  06/01/2022  . Tetanus Vaccine  03/12/2029  . Flu Shot  Completed  . DEXA scan (bone density measurement)  Completed  .  Hepatitis C: One time screening is recommended by Center for Disease Control  (CDC) for  adults born from [birth year] through 1967.   Completed

## 2020-11-05 LAB — CMP14+EGFR
ALT: 19 IU/L (ref 0–32)
AST: 24 IU/L (ref 0–40)
Albumin/Globulin Ratio: 1.4 (ref 1.2–2.2)
Albumin: 4.1 g/dL (ref 3.8–4.8)
Alkaline Phosphatase: 112 IU/L (ref 44–121)
BUN/Creatinine Ratio: 12 (ref 12–28)
BUN: 17 mg/dL (ref 8–27)
Bilirubin Total: 0.5 mg/dL (ref 0.0–1.2)
CO2: 24 mmol/L (ref 20–29)
Calcium: 10.5 mg/dL — ABNORMAL HIGH (ref 8.7–10.3)
Chloride: 85 mmol/L — ABNORMAL LOW (ref 96–106)
Creatinine, Ser: 1.39 mg/dL — ABNORMAL HIGH (ref 0.57–1.00)
GFR calc Af Amer: 46 mL/min/{1.73_m2} — ABNORMAL LOW (ref >59)
GFR calc non Af Amer: 40 mL/min/{1.73_m2} — ABNORMAL LOW (ref >59)
Globulin, Total: 3 g/dL (ref 1.5–4.5)
Glucose: 89 mg/dL (ref 65–99)
Potassium: 4 mmol/L (ref 3.5–5.2)
Sodium: 126 mmol/L — ABNORMAL LOW (ref 134–144)
Total Protein: 7.1 g/dL (ref 6.0–8.5)

## 2020-11-05 LAB — LIPID PANEL
Chol/HDL Ratio: 2.4 ratio (ref 0.0–4.4)
Cholesterol, Total: 111 mg/dL (ref 100–199)
HDL: 47 mg/dL (ref >39)
LDL Chol Calc (NIH): 54 mg/dL (ref 0–99)
Triglycerides: 39 mg/dL (ref 0–149)
VLDL Cholesterol Cal: 10 mg/dL (ref 5–40)

## 2020-11-05 LAB — HEMOGLOBIN A1C
Est. average glucose Bld gHb Est-mCnc: 128 mg/dL
Hgb A1c MFr Bld: 6.1 % — ABNORMAL HIGH (ref 4.8–5.6)

## 2020-11-06 ENCOUNTER — Encounter: Payer: Self-pay | Admitting: Internal Medicine

## 2020-11-09 ENCOUNTER — Other Ambulatory Visit: Payer: Self-pay

## 2020-11-09 DIAGNOSIS — E871 Hypo-osmolality and hyponatremia: Secondary | ICD-10-CM

## 2020-11-10 LAB — PROTEIN ELECTROPHORESIS
A/G Ratio: 1.1 (ref 0.7–1.7)
Albumin ELP: 3.6 g/dL (ref 2.9–4.4)
Alpha 1: 0.3 g/dL (ref 0.0–0.4)
Alpha 2: 0.9 g/dL (ref 0.4–1.0)
Beta: 1.1 g/dL (ref 0.7–1.3)
Gamma Globulin: 1.1 g/dL (ref 0.4–1.8)
Globulin, Total: 3.4 g/dL (ref 2.2–3.9)
Total Protein: 7 g/dL (ref 6.0–8.5)

## 2020-11-10 LAB — SPECIMEN STATUS REPORT

## 2020-11-11 ENCOUNTER — Encounter: Payer: Self-pay | Admitting: Internal Medicine

## 2020-11-11 ENCOUNTER — Encounter: Payer: Self-pay | Admitting: Adult Health

## 2020-11-13 ENCOUNTER — Other Ambulatory Visit: Payer: Self-pay | Admitting: *Deleted

## 2020-11-13 MED ORDER — VIMPAT 100 MG PO TABS: ORAL_TABLET | ORAL | 1 refills | Status: DC

## 2020-11-16 ENCOUNTER — Other Ambulatory Visit: Payer: BC Managed Care – PPO

## 2020-11-16 ENCOUNTER — Other Ambulatory Visit: Payer: Self-pay

## 2020-11-16 ENCOUNTER — Telehealth: Payer: Self-pay | Admitting: *Deleted

## 2020-11-16 DIAGNOSIS — E871 Hypo-osmolality and hyponatremia: Secondary | ICD-10-CM

## 2020-11-16 LAB — BMP8+EGFR
BUN/Creatinine Ratio: 17 (ref 12–28)
BUN: 22 mg/dL (ref 8–27)
CO2: 26 mmol/L (ref 20–29)
Calcium: 10.2 mg/dL (ref 8.7–10.3)
Chloride: 93 mmol/L — ABNORMAL LOW (ref 96–106)
Creatinine, Ser: 1.31 mg/dL — ABNORMAL HIGH (ref 0.57–1.00)
GFR calc Af Amer: 49 mL/min/{1.73_m2} — ABNORMAL LOW (ref >59)
GFR calc non Af Amer: 42 mL/min/{1.73_m2} — ABNORMAL LOW (ref >59)
Glucose: 115 mg/dL — ABNORMAL HIGH (ref 65–99)
Potassium: 4.2 mmol/L (ref 3.5–5.2)
Sodium: 132 mmol/L — ABNORMAL LOW (ref 134–144)

## 2020-11-16 MED ORDER — VIMPAT 150 MG PO TABS: ORAL_TABLET | ORAL | 0 refills | Status: DC

## 2020-11-16 MED ORDER — VIMPAT 100 MG PO TABS: ORAL_TABLET | ORAL | 0 refills | Status: DC

## 2020-11-16 NOTE — Telephone Encounter (Signed)
Received Faxed Prescription refill for [name], [place] [facility] registiry was checked and pt is up to date on appointments. Refill is appropriate

## 2020-11-16 NOTE — Telephone Encounter (Signed)
Patient forms received back from provider, and faxed on 1/24 to [facility].

## 2020-11-16 NOTE — Addendum Note (Signed)
Addended by: Brandon Melnick on: 11/16/2020 01:40 PM   Modules accepted: Orders

## 2020-11-16 NOTE — Telephone Encounter (Signed)
LMVM for her to return call re: her [name].

## 2020-11-16 NOTE — Telephone Encounter (Signed)
I called [facility] she is not getting vimpat thru [facility].  I called pt and she has not been able to get anymore vimpat from pharmcy until 11-20-20 (she says due to halving her medication).  I relayed that she can get samples (willl place up front for her). vimpat 100mg  #14 and vimpat 150mg  #14.

## 2020-11-16 NOTE — Telephone Encounter (Signed)
Pt returned phone call. Would like a call back. 

## 2020-11-17 ENCOUNTER — Encounter: Payer: Self-pay | Admitting: Internal Medicine

## 2020-11-18 ENCOUNTER — Encounter: Payer: Self-pay | Admitting: Adult Health

## 2020-11-18 ENCOUNTER — Telehealth: Payer: Self-pay | Admitting: *Deleted

## 2020-11-18 NOTE — Telephone Encounter (Signed)
Received my chart with updated med list. MAR updated.

## 2020-11-26 ENCOUNTER — Ambulatory Visit (INDEPENDENT_AMBULATORY_CARE_PROVIDER_SITE_OTHER): Payer: Medicare Other | Admitting: Adult Health

## 2020-11-26 VITALS — BP 136/73 | HR 71 | Ht 62.0 in | Wt 175.0 lb

## 2020-11-26 DIAGNOSIS — R569 Unspecified convulsions: Secondary | ICD-10-CM

## 2020-11-26 NOTE — Patient Instructions (Signed)
Your Plan:  Continue [name] 100 mg in the AM, 150 mg in the PM If your symptoms worsen or you develop new symptoms please let us know.    Thank you for coming to see [place] at [facility]. I hope we have been able to provide you high quality care today.  You may receive a patient satisfaction survey over the next few weeks. We would appreciate your feedback and comments so that we may continue to improve ourselves and the health of our patients.

## 2020-11-26 NOTE — Progress Notes (Signed)
I have read the note, and I agree with the clinical assessment and plan.  Charles K Willis   

## 2020-11-26 NOTE — Progress Notes (Signed)
PATIENT: Sara [name] DOB: [birth year]  REASON FOR VISIT: follow up HISTORY FROM: patient  HISTORY OF PRESENT ILLNESS: Today 11/26/20:  Sara [name] is a 65 year old female with a history of seizures and obstructive sleep apnea.  She returns today for follow-up.  She states that she had another "attack" in December or January.  The attacks always consist of her making sounds during her sleep.  She called her primary care provider.  Dr. Baird Cancer suggested that she take her sugar or have her husband take her sugar when she is making the sounds.  The patient states that typically when she wakes up she feels fine.  She continues to take [name] 100 mg in the morning and $RemoveBef'150mg'ALNYpARFPY$  at bedtime.  She denies missing any medication.  She returns today for an evaluation.  07/22/20:Sara [name] is a 65 year old female with a history of seizures and obstructive sleep apnea.  She returns today for follow-up.  Since her [name] was increased she reports that her husband has not reported any nocturnal events.  Overall she feels that she has been doing well.  Her CPAP download indicates that she use her machine nightly for compliance of 100%.  She used her machine greater than 4 hours each night.  On average she uses her machine 6 hours and 36 minutes.  Her residual AHI is 1.6 on 9 cm of water with EPR of 2.  Leak in the 95th percentile is 42 L/min.  She states that she does not feel the machine leaking at night.  Overall she feels that she is doing well with the CPAP  HISTORY 03/02/20  Sara [name] is a 65 year old female with history of seizures and obstructive sleep apnea on CPAP.  Her seizures are typically characterized as staring off.  There was question of possible nocturnal seizure last year when she was taking Ozempic, question possible nocturnal seizure described as chewing around 2 AM.  [name] level  was normal, she remained on [name] 100 mg twice daily.  She is overall doing well, has reported 1 or 2 episodes  her husband describes as, her making a loud noise, he couldn't wake her, when wearing CPAP, he went back to sleep. She has been trying to not eat before bed.  Recent A1c was 6.3.  She reports increase in stress.  She says when her husband notices these episodes, it it when she she is really tired, and not well rested.  She really does not want to go up on [name], she requires brand-name.  She presents today for evaluation unaccompanied.  Checked CPAP for equipment function, used for more than 4 hours, 29 out of 30 days, 97%, average usage (days used) 6 hours 44 minutes, set pressure 9 cmH2O, leak in the 95th percentile was 48.5, AHI 2.8.   REVIEW OF SYSTEMS: Out of a complete 14 system review of symptoms, the patient complains only of the following symptoms, and all other reviewed systems are negative.   ALLERGIES: Allergies  Allergen Reactions  . Sulfa Antibiotics Itching    HOME MEDICATIONS: Outpatient Medications Prior to Visit  Medication Sig Dispense Refill  . anagrelide ([name]) 0.5 MG capsule Take 1 capsule (0.5 mg total) by mouth at bedtime. On Mondays, Wednesdays and Fridays 15 capsule 5  . anagrelide ([name]) 1 MG capsule Take 1 capsule (1 mg total) by mouth daily. 30 capsule 5  . aspirin EC 81 MG tablet Take 1 tablet (81 mg total) by mouth daily. Swallow whole. (Patient taking differently:  Take 81 mg by mouth daily. 2 tabs at bedtime) 150 tablet 2  . atorvastatin (LIPITOR) 40 MG tablet TAKE 1 TABLET BY MOUTH DAILY AT 6 PM 90 tablet 2  . B Complex Vitamins (B COMPLEX PO) Take 1 tablet by mouth daily.     . Blood Glucose Monitoring Suppl (ONETOUCH VERIO IQ SYSTEM) w/Device KIT Use as directed to check blood sugars 1 time per day dx: e11.22 1 kit 1  . Blood Pressure Monitoring (BLOOD PRESSURE CUFF) MISC Check blood pressure 1-2 times a day 3 h after taking BP medication.  Dx- CAD        HTN. 1 each 0  . chlorthalidone (HYGROTON) 25 MG tablet Take 12.5 mg by mouth daily.    .  Cholecalciferol (VITAMIN D3) 2000 UNITS capsule Take 2,000 Units by mouth daily.     Marland Kitchen glucose blood (ONETOUCH VERIO) test strip USE AS DIRECTED TO TEST BLOOD SUGR LEVELS DAILY 150 strip 3  . hydrochlorothiazide (MICROZIDE) 12.5 MG capsule TAKE 1 CAPSULE(12.5 MG) BY MOUTH DAILY 90 capsule 3  . Lacosamide ([name]) 100 MG TABS Take one tablet in AM LOT [id number], 2/26. One pack of 14 tabs 14 tablet 0  . Lacosamide ([name]) 150 MG TABS Take one tablet in PM 14 tablet 0  . metoprolol tartrate (LOPRESSOR) 50 MG tablet TAKE 1 AND 1/2 TABLETS BY MOUTH TWICE DAILY 45 tablet 10  . Multiple Vitamin (MULTIVITAMIN) capsule Take 1 capsule by mouth daily.    Glory Rosebush Delica Lancets 42A MISC USE AS DIRECTED TO CHECK BLOOD SUGAR LEVELS ONCE DAILY 200 each 2  . Specialty Vitamins Products (MAGNESIUM, AMINO ACID CHELATE,) 133 MG tablet Take 1 tablet by mouth 2 (two) times daily.    Marland Kitchen telmisartan (MICARDIS) 80 MG tablet TAKE 1 TABLET BY MOUTH EVERY DAY 90 tablet 1  . [name] 100 MG TABS Take 1 tablet in the morning, take 1.5 tablets in the evening 210 tablet 1   No facility-administered medications prior to visit.    PAST MEDICAL HISTORY: Past Medical History:  Diagnosis Date  . Diabetes mellitus   . Gout   . Hyperlipemia   . Hypertension   . LVH (left ventricular hypertrophy)    12/21/09 Echo -mild asymmetric LVH,EF =>55%  . Morbid obesity ([place])   . OSA (obstructive sleep apnea)   . PAD (peripheral artery disease) ([place])   . Renal artery stenosis ([place]) 12/13/2010   PTCA and Stent - right  . Seizures ([place])   . Syncopal episodes   . Thrombocytosis     PAST SURGICAL HISTORY: Past Surgical History:  Procedure Laterality Date  . BREAST CYST EXCISION     S/P Benign Right Breast Cyst Removal.  . BREAST REDUCTION SURGERY  1995   S/P Bilateral breast reduction  . [place]   Times  Two.  . CORONARY ARTERY BYPASS GRAFT N/A 04/21/2017   Procedure: CORONARY ARTERY BYPASS GRAFTING (CABG) x  2, USING LEFT MAMMARY ARTERY AND RIGHT GREATER SAPHENOUS VEIN HARVESTED ENDOSCOPICALLY;  Surgeon: [name], MD;  Location: [facility];  Service: Open Heart Surgery;  Laterality: N/A;  . EXTREMITY CYST EXCISION  1993   left wrist  . LEFT HEART CATH AND CORONARY ANGIOGRAPHY N/A 04/17/2017   Procedure: Left Heart Cath and Coronary Angiography;  Surgeon: Jettie Booze, MD;  Location: [facility];  Service: Cardiovascular;  Laterality: N/A;  . PARTIAL HYSTERECTOMY  1994  . renal artery stent placement Right   .  TEE WITHOUT CARDIOVERSION N/A 04/21/2017   Procedure: TRANSESOPHAGEAL ECHOCARDIOGRAM (TEE);  Surgeon: Alleen Borne, MD;  Location[facility];  Service: Open Heart Surgery;  Laterality: N/A;    FAMILY HISTORY: Family History  Problem Relation Age of Onset  . Heart failure Mother   . Diabetes Father   . Diabetes Sister   . Hypertension Sister   . Hypertension Sister   . Hypertension Sister   . Breast cancer Maternal Aunt        2 maternal aunts had breast cancer.  . Breast cancer Maternal Grandmother     SOCIAL HISTORY: Social History   Socioeconomic History  . Marital status: Married    Spouse name: Leonette Most  . Number of children: 2  . Years of education: 67  . Highest education level: Not on file  Occupational History  . Occupation: TRANSPORTATION COOR.    Employer: [employer]  Tobacco Use  . Smoking status: Never Smoker  . Smokeless tobacco: Never Used  Vaping Use  . Vaping Use: Never used  Substance and Sexual Activity  . Alcohol use: No    Alcohol/week: 0.0 standard drinks  . Drug use: No  . Sexual activity: Yes  Other Topics Concern  . Not on file  Social History Narrative   Patient lives at husband and her son, home with family.   Patient has two adult children.   Patient is not drinking any caffeine.   Patient is working full-time.   Patient has a college education.   Patient is right-handed.   Social Determinants of Health    Financial Resource Strain: Low Risk   . Difficulty of Paying Living Expenses: Not hard at all  Food Insecurity: No Food Insecurity  . Worried About Programme researcher, broadcasting/film/video in the Last Year: Never true  . Ran Out of Food in the Last Year: Never true  Transportation Needs: No Transportation Needs  . Lack of Transportation (Medical): No  . Lack of Transportation (Non-Medical): No  Physical Activity: Insufficiently Active  . Days of Exercise per Week: 2 days  . Minutes of Exercise per Session: 30 min  Stress: No Stress Concern Present  . Feeling of Stress : Not at all  Social Connections: Socially Integrated  . Frequency of Communication with Friends and Family: More than three times a week  . Frequency of Social Gatherings with Friends and Family: Not on file  . Attends Religious Services: More than 4 times per year  . Active Member of Clubs or Organizations: Yes  . Attends Banker Meetings: More than 4 times per year  . Marital Status: Married  Catering manager Violence: Not At Risk  . Fear of Current or Ex-Partner: No  . Emotionally Abused: No  . Physically Abused: No  . Sexually Abused: No      PHYSICAL EXAM  Vitals:   11/26/20 1018  BP: 136/73  Pulse: 71  Weight: 175 lb (79.4 kg)  Height: 5\' 2"  (1.575 m)   Body mass index is 32.01 kg/m.  Generalized: Well developed, in no acute distress   Neurological examination  Mentation: Alert oriented to time, place, history taking. Follows all commands speech and language fluent Cranial nerve II-XII: Pupils were equal round reactive to light. Extraocular movements were full, visual field were full on confrontational test.. Head turning and shoulder shrug  were normal and symmetric. Motor: The motor testing reveals 5 over 5 strength of all 4 extremities. Good symmetric motor tone is noted throughout.  Sensory: Sensory testing is intact to soft touch on all 4 extremities. No evidence of extinction is noted.   Coordination: Cerebellar testing reveals good finger-nose-finger and heel-to-shin bilaterally.  Gait and station: Gait is normal.   Reflexes: Deep tendon reflexes are symmetric and normal bilaterally.   DIAGNOSTIC DATA (LABS, IMAGING, TESTING) - I reviewed patient records, labs, notes, testing and imaging myself where available.  Lab Results  Component Value Date   WBC 10.3 10/28/2020   HGB 13.2 10/28/2020   HCT 38.7 10/28/2020   MCV 85.8 10/28/2020   PLT 316 10/28/2020      Component Value Date/Time   NA 132 (L) 11/16/2020 1209   NA 139 08/11/2017 1528   K 4.2 11/16/2020 1209   K 4.2 08/11/2017 1528   CL 93 (L) 11/16/2020 1209   CL 102 03/19/2013 1535   CO2 26 11/16/2020 1209   CO2 27 08/11/2017 1528   GLUCOSE 115 (H) 11/16/2020 1209   GLUCOSE 108 (H) 09/04/2020 1232   GLUCOSE 93 08/11/2017 1528   GLUCOSE 117 (H) 03/19/2013 1535   BUN 22 11/16/2020 1209   BUN 15.0 08/11/2017 1528   CREATININE 1.31 (H) 11/16/2020 1209   CREATININE 1.1 08/11/2017 1528   CALCIUM 10.2 11/16/2020 1209   CALCIUM 9.2 08/11/2017 1528   PROT 7.1 11/04/2020 1628   PROT 7.0 11/04/2020 1628   PROT 7.0 08/11/2017 1528   ALBUMIN 4.1 11/04/2020 1628   ALBUMIN 3.6 08/11/2017 1528   AST 24 11/04/2020 1628   AST 27 08/11/2017 1528   ALT 19 11/04/2020 1628   ALT 26 08/11/2017 1528   ALKPHOS 112 11/04/2020 1628   ALKPHOS 88 08/11/2017 1528   BILITOT 0.5 11/04/2020 1628   BILITOT 0.37 08/11/2017 1528   GFRNONAA 42 (L) 11/16/2020 1209   GFRNONAA 44 (L) 09/04/2020 1232   GFRAA 49 (L) 11/16/2020 1209   Lab Results  Component Value Date   CHOL 111 11/04/2020   HDL 47 11/04/2020   LDLCALC 54 11/04/2020   TRIG 39 11/04/2020   CHOLHDL 2.4 11/04/2020   Lab Results  Component Value Date   HGBA1C 6.1 (H) 11/04/2020   Lab Results  Component Value Date   VITAMINB12 924 03/13/2019   Lab Results  Component Value Date   TSH 2.060 11/22/2018      ASSESSMENT AND PLAN 65 y.o. year old female   has a past medical history of Diabetes mellitus, Gout, Hyperlipemia, Hypertension, LVH (left ventricular hypertrophy), Morbid obesity ([place]), OSA (obstructive sleep apnea), PAD (peripheral artery disease) ([place]), Renal artery stenosis ([place]) (12/13/2010), Seizures ([place]), Syncopal episodes, and Thrombocytosis. here with:   1.  Seizures   Continue [name] 100 mg in the morning and 150 mg in the evening  Advised that her events at night seem less likely to be seizures.  I agree with her PCP and that she should check her sugar to ensure that she is not hypoglycemic.  Advised if she has any seizure event she should let us know  We will check a CPAP report at the next visit  Follow-up in 6 months or sooner if needed   I spent 30 minutes of face-to-face and non-face-to-face time with patient.  This included previsit chart review, lab review, study review, order entry, electronic health record documentation, patient education.  Ward Givens, MSN, NP-C 11/26/2020, 10:53 AM [facility] [address], [place], [place] [address] [phone]

## 2020-12-03 ENCOUNTER — Other Ambulatory Visit: Payer: Self-pay | Admitting: *Deleted

## 2020-12-03 ENCOUNTER — Telehealth: Payer: Self-pay | Admitting: Cardiovascular Disease

## 2020-12-03 ENCOUNTER — Encounter: Payer: Self-pay | Admitting: Cardiovascular Disease

## 2020-12-03 NOTE — Telephone Encounter (Signed)
Error

## 2020-12-03 NOTE — Telephone Encounter (Signed)
Duplicate. See patient MyChart message

## 2020-12-03 NOTE — Telephone Encounter (Signed)
Pt c/o medication issue:  1. Name of Medication:   hydrochlorothiazide (MICROZIDE) 12.5 MG capsule    2. How are you currently taking this medication (dosage and times per day)?  Patient states she has not taken this medication in about a month.  3. Are you having a reaction (difficulty breathing--STAT)? No   4. What is your medication issue?   Patient states she assumes this medication caused her to lose weight. She states she has lost almost 40 lbs and her nephrologist advised that she stop taking it. See most recent patient message.

## 2020-12-04 ENCOUNTER — Other Ambulatory Visit: Payer: Self-pay

## 2020-12-04 ENCOUNTER — Inpatient Hospital Stay: Payer: Medicare Other | Attending: Nurse Practitioner

## 2020-12-04 DIAGNOSIS — I10 Essential (primary) hypertension: Secondary | ICD-10-CM | POA: Diagnosis not present

## 2020-12-04 DIAGNOSIS — D473 Essential (hemorrhagic) thrombocythemia: Secondary | ICD-10-CM | POA: Diagnosis not present

## 2020-12-04 DIAGNOSIS — E119 Type 2 diabetes mellitus without complications: Secondary | ICD-10-CM | POA: Insufficient documentation

## 2020-12-04 DIAGNOSIS — G4733 Obstructive sleep apnea (adult) (pediatric): Secondary | ICD-10-CM | POA: Diagnosis not present

## 2020-12-04 DIAGNOSIS — E785 Hyperlipidemia, unspecified: Secondary | ICD-10-CM | POA: Diagnosis not present

## 2020-12-04 DIAGNOSIS — Z79899 Other long term (current) drug therapy: Secondary | ICD-10-CM | POA: Insufficient documentation

## 2020-12-04 DIAGNOSIS — I2510 Atherosclerotic heart disease of native coronary artery without angina pectoris: Secondary | ICD-10-CM | POA: Insufficient documentation

## 2020-12-04 DIAGNOSIS — Z7982 Long term (current) use of aspirin: Secondary | ICD-10-CM | POA: Diagnosis not present

## 2020-12-04 DIAGNOSIS — Z0000 Encounter for general adult medical examination without abnormal findings: Secondary | ICD-10-CM

## 2020-12-04 LAB — CBC WITH DIFFERENTIAL/PLATELET
Abs Immature Granulocytes: 0.04 10*3/uL (ref 0.00–0.07)
Basophils Absolute: 0.1 10*3/uL (ref 0.0–0.1)
Basophils Relative: 1 %
Eosinophils Absolute: 0.2 10*3/uL (ref 0.0–0.5)
Eosinophils Relative: 2 %
HCT: 38 % (ref 36.0–46.0)
Hemoglobin: 12.9 g/dL (ref 12.0–15.0)
Immature Granulocytes: 1 %
Lymphocytes Relative: 17 %
Lymphs Abs: 1.3 10*3/uL (ref 0.7–4.0)
MCH: 29.3 pg (ref 26.0–34.0)
MCHC: 33.9 g/dL (ref 30.0–36.0)
MCV: 86.2 fL (ref 80.0–100.0)
Monocytes Absolute: 0.5 10*3/uL (ref 0.1–1.0)
Monocytes Relative: 6 %
Neutro Abs: 5.8 10*3/uL (ref 1.7–7.7)
Neutrophils Relative %: 73 %
Platelets: 279 10*3/uL (ref 150–400)
RBC: 4.41 MIL/uL (ref 3.87–5.11)
RDW: 12.9 % (ref 11.5–15.5)
WBC: 8 10*3/uL (ref 4.0–10.5)
nRBC: 0 % (ref 0.0–0.2)

## 2020-12-06 ENCOUNTER — Encounter: Payer: Self-pay | Admitting: Hematology

## 2020-12-08 ENCOUNTER — Encounter: Payer: Self-pay | Admitting: Adult Health

## 2020-12-08 ENCOUNTER — Encounter: Payer: Self-pay | Admitting: Internal Medicine

## 2020-12-19 ENCOUNTER — Other Ambulatory Visit: Payer: Self-pay | Admitting: Cardiovascular Disease

## 2020-12-21 MED ORDER — METOPROLOL TARTRATE 50 MG PO TABS
75.0000 mg | ORAL_TABLET | Freq: Two times a day (BID) | ORAL | 3 refills | Status: DC
Start: 2020-12-21 — End: 2021-10-19

## 2020-12-21 MED ORDER — METOPROLOL TARTRATE 50 MG PO TABS
75.0000 mg | ORAL_TABLET | Freq: Two times a day (BID) | ORAL | 1 refills | Status: DC
Start: 2020-12-21 — End: 2020-12-21

## 2020-12-22 ENCOUNTER — Encounter: Payer: Self-pay | Admitting: Internal Medicine

## 2020-12-27 ENCOUNTER — Other Ambulatory Visit: Payer: Self-pay | Admitting: Internal Medicine

## 2021-01-14 ENCOUNTER — Other Ambulatory Visit: Payer: Self-pay

## 2021-01-14 ENCOUNTER — Telehealth: Payer: Self-pay | Admitting: *Deleted

## 2021-01-14 ENCOUNTER — Encounter: Payer: Self-pay | Admitting: Internal Medicine

## 2021-01-14 ENCOUNTER — Encounter: Payer: Self-pay | Admitting: Adult Health

## 2021-01-14 ENCOUNTER — Ambulatory Visit (INDEPENDENT_AMBULATORY_CARE_PROVIDER_SITE_OTHER): Payer: Medicare Other | Admitting: Internal Medicine

## 2021-01-14 VITALS — Temp 98.1°F | Ht 62.0 in | Wt 174.6 lb

## 2021-01-14 DIAGNOSIS — E1122 Type 2 diabetes mellitus with diabetic chronic kidney disease: Secondary | ICD-10-CM | POA: Diagnosis not present

## 2021-01-14 DIAGNOSIS — R569 Unspecified convulsions: Secondary | ICD-10-CM

## 2021-01-14 DIAGNOSIS — I130 Hypertensive heart and chronic kidney disease with heart failure and stage 1 through stage 4 chronic kidney disease, or unspecified chronic kidney disease: Secondary | ICD-10-CM

## 2021-01-14 DIAGNOSIS — I471 Supraventricular tachycardia: Secondary | ICD-10-CM | POA: Diagnosis not present

## 2021-01-14 DIAGNOSIS — I5032 Chronic diastolic (congestive) heart failure: Secondary | ICD-10-CM

## 2021-01-14 DIAGNOSIS — N1831 Chronic kidney disease, stage 3a: Secondary | ICD-10-CM

## 2021-01-14 NOTE — Telephone Encounter (Signed)
For review

## 2021-01-14 NOTE — Patient Instructions (Signed)

## 2021-01-14 NOTE — Progress Notes (Signed)
Sara [name] as a Neurosurgeon for Sara Aliment, MD.,have documented all relevant documentation on the behalf of Sara Aliment, MD,as directed by  Sara Aliment, MD while in the presence of Sara Aliment, MD. This visit occurred during the SARS-CoV-2 public health emergency.  Safety protocols were in place, including screening questions prior to the visit, additional usage of staff PPE, and extensive cleaning of exam room while observing appropriate contact time as indicated for disinfecting solutions.  Subjective:     Patient ID: Sara [name] , female    DOB: Jun 10, 1956 , 65 y.o.   MRN: [id number]   Chief Complaint  Patient presents with  . Hypertension  . Diabetes    HPI  She is here today for BP/dm f/u. Denies headaches, chest pain and shortness of breath. Admits she is not yet exercising regularly. Has been drinking "beet juice" and has cut back on her portion sizes. She is pleased with her weight loss thus far. She no longer needs meds for diabetes. Pt would like to wean off of all meds eventually.   Diabetes She presents for her follow-up diabetic visit. She has type 2 diabetes mellitus. There are no hypoglycemic associated symptoms. There are no diabetic associated symptoms. Pertinent negatives for diabetes include no blurred vision and no chest pain. There are no hypoglycemic complications. Diabetic complications include heart disease and nephropathy. Risk factors for coronary artery disease include diabetes mellitus, dyslipidemia, hypertension, obesity, post-menopausal and sedentary lifestyle. She is compliant with treatment most of the time. She is following a diabetic diet. She participates in exercise three times a week. Her breakfast blood glucose is taken between 7-8 am. Her breakfast blood glucose range is generally 90-110 mg/dl. An ACE inhibitor/angiotensin II receptor blocker is being taken. Eye exam is current.  Hypertension This is a chronic problem. The  current episode started more than 1 year ago. The problem has been gradually improving since onset. The problem is uncontrolled. Pertinent negatives include no blurred vision, chest pain, palpitations or shortness of breath. The current treatment provides moderate improvement. Compliance problems include exercise.  Hypertensive end-organ damage includes CAD/MI.     Past Medical History:  Diagnosis Date  . Diabetes mellitus   . Gout   . Hyperlipemia   . Hypertension   . LVH (left ventricular hypertrophy)    12/21/09 Echo -mild asymmetric LVH,EF =>55%  . Morbid obesity (HCC)   . OSA (obstructive sleep apnea)   . PAD (peripheral artery disease) (HCC)   . Renal artery stenosis (HCC) 12/13/2010   PTCA and Stent - right  . Seizures (H[facility])   . Syncopal episodes   . Thrombocytosis      Family History  Problem Relation Age of Onset  . Heart failure Mother   . Diabetes Father   . Diabetes Sister   . Hypertension Sister   . Hypertension Sister   . Hypertension Sister   . Breast cancer Maternal Aunt        2 maternal aunts had breast cancer.  . Breast cancer Maternal Grandmother      Current Outpatient Medications:  .  aspirin EC 81 MG tablet, Take 1 tablet (81 mg total) by mouth daily. Swallow whole. (Patient taking differently: Take 81 mg by mouth daily.), Disp: 150 tablet, Rfl: 2 .  atorvastatin (LIPITOR) 40 MG tablet, TAKE 1 TABLET BY MOUTH DAILY AT 6 PM, Disp: 90 tablet, Rfl: 2 .  B Complex Vitamins (B COMPLEX PO), Take 1 tablet by  mouth daily. , Disp: , Rfl:  .  Cholecalciferol (VITAMIN D3) 2000 UNITS capsule, Take 2,000 Units by mouth daily. , Disp: , Rfl:  .  Lacosamide ([name]) 150 MG TABS, Take one tablet in PM, Disp: 14 tablet, Rfl: 0 .  metoprolol tartrate (LOPRESSOR) 50 MG tablet, Take 1.5 tablets (75 mg total) by mouth 2 (two) times daily., Disp: 90 tablet, Rfl: 3 .  Multiple Vitamin (MULTIVITAMIN) capsule, Take 1 capsule by mouth daily., Disp: , Rfl:  .  Specialty Vitamins  Products (MAGNESIUM, AMINO ACID CHELATE,) 133 MG tablet, Take 1 tablet by mouth 2 (two) times daily., Disp: , Rfl:  .  telmisartan (MICARDIS) 80 MG tablet, TAKE 1 TABLET BY MOUTH EVERY DAY (Patient taking differently: Take 80 mg by mouth daily.), Disp: 90 tablet, Rfl: 1 .  anagrelide ([name]) 1 MG capsule, TAKE 1 CAPSULE BY MOUTH DAILY (Patient taking differently: Take 1-1.5 mg by mouth See admin instructions. Take 1 mg tablet by mouth every day except on Mon, wednesdays and Fridays take 1.$RemoveBefore'5mg'gQsRRjXJQihhY$  tablet per patient), Disp: 30 capsule, Rfl: 5 .  Lacosamide ([name]) 100 MG TABS, Take 1 tablet by mouth See admin instructions. Take 100 mg tablet by mouth in the mornings then take 150 mg tablet by mouth in the evening. Patient states she splits the 100 mg in half to make the total dose of 150 mg in the evening per patient, Disp: , Rfl:    Allergies  Allergen Reactions  . Sulfa Antibiotics Itching     Review of Systems  Constitutional: Negative.   HENT: Negative.   Eyes: Negative.  Negative for blurred vision.  Respiratory: Negative.  Negative for shortness of breath.   Cardiovascular: Negative.  Negative for chest pain and palpitations.  Gastrointestinal: Negative.   Endocrine: Negative.      Today's Vitals   01/14/21 1423  Temp: 98.1 F (36.7 C)  TempSrc: Oral  Weight: 174 lb 9.6 oz (79.2 kg)  Height: $Remove'5\' 2"'sGsiuSs$  (1.575 m)  PainSc: 0-No pain   Body mass index is 31.93 kg/m.  BP Readings from Last 3 Encounters:  02/04/21 (!) 146/82  01/28/21 (!) 157/81  01/22/21 (!) 72/55    Objective:  Physical Exam Vitals and nursing note reviewed.  Constitutional:      Appearance: Normal appearance.  HENT:     Head: Normocephalic and atraumatic.     Nose:     Comments: Masked     Mouth/Throat:     Comments: Masked  Cardiovascular:     Rate and Rhythm: Normal rate and regular rhythm.     Heart sounds: Normal heart sounds.  Pulmonary:     Effort: Pulmonary effort is normal.     Breath sounds:  Normal breath sounds.  Musculoskeletal:     Cervical back: Normal range of motion.  Skin:    General: Skin is warm.  Neurological:     General: No focal deficit present.     Mental Status: She is alert.  Psychiatric:        Mood and Affect: Mood normal.        Behavior: Behavior normal.         Assessment And Plan:     1. Hypertensive heart and renal disease with renal failure, stage 1 through stage 4 or unspecified chronic kidney disease, with heart failure (HCC) Comments: Chronic, fair control. Advised optimal control is less than 130/80. Advised to follow low sodium diet and to aim for at least 150 min of exercise  per week.  - CMP14+EGFR  2. Type 2 diabetes mellitus with stage 3a chronic kidney disease, without long-term current use of insulin ([place]) Comments: She is now off of meds.   3. Chronic diastolic congestive heart failure ([place]) Comments: She appears euvolemic. Again, importance of following low sodium diet was d/w patient.   4. Seizures ([place]) Comments: Asymptomatic, no new seizures since last visit.   5. SVT (supraventricular tachycardia) ([place]) Comments: She is rate controlled. May beneift from magnesium supplementation.  - Magnesium - TSH     Patient was given opportunity to ask questions. Patient verbalized understanding of the plan and was able to repeat key elements of the plan. All questions were answered to their satisfaction.   I, [name], MD, have reviewed all documentation for this visit. The documentation on 01/14/21 for the exam, diagnosis, procedures, and orders are all accurate and complete.   IF YOU HAVE BEEN REFERRED TO A SPECIALIST, IT MAY TAKE 1-2 WEEKS TO SCHEDULE/PROCESS THE REFERRAL. IF YOU HAVE NOT HEARD FROM US/SPECIALIST IN TWO WEEKS, PLEASE GIVE [place] A CALL AT [phone].   THE PATIENT IS ENCOURAGED TO PRACTICE SOCIAL DISTANCING DUE TO THE COVID-19 PANDEMIC.

## 2021-01-14 NOTE — Telephone Encounter (Signed)
Called the patient pertaining to her MyChart message. She stated that her husband noted that once in a while she will have an "episode" where she will cough, her breathing becomes different, and she will smack her lips. She stated that hasn't happened in months. She does wear her CPAP for four hours each night and that the episodes will happen when her CPAP is off. She stated that she has no recollection of these episodes.  She did contact her PCP and her neurologist as well about these episodes. She saw her PCP today and labs were drawn.

## 2021-01-15 ENCOUNTER — Telehealth: Payer: Self-pay

## 2021-01-15 LAB — CMP14+EGFR
ALT: 20 IU/L (ref 0–32)
AST: 24 IU/L (ref 0–40)
Albumin/Globulin Ratio: 1.6 (ref 1.2–2.2)
Albumin: 4.3 g/dL (ref 3.8–4.8)
Alkaline Phosphatase: 92 IU/L (ref 44–121)
BUN/Creatinine Ratio: 10 — ABNORMAL LOW (ref 12–28)
BUN: 13 mg/dL (ref 8–27)
Bilirubin Total: 0.4 mg/dL (ref 0.0–1.2)
CO2: 24 mmol/L (ref 20–29)
Calcium: 10.5 mg/dL — ABNORMAL HIGH (ref 8.7–10.3)
Chloride: 93 mmol/L — ABNORMAL LOW (ref 96–106)
Creatinine, Ser: 1.33 mg/dL — ABNORMAL HIGH (ref 0.57–1.00)
Globulin, Total: 2.7 g/dL (ref 1.5–4.5)
Glucose: 90 mg/dL (ref 65–99)
Potassium: 4.5 mmol/L (ref 3.5–5.2)
Sodium: 135 mmol/L (ref 134–144)
Total Protein: 7 g/dL (ref 6.0–8.5)
eGFR: 44 mL/min/{1.73_m2} — ABNORMAL LOW (ref >59)

## 2021-01-15 LAB — TSH: TSH: 1.65 u[IU]/mL (ref 0.450–4.500)

## 2021-01-15 LAB — MAGNESIUM: Magnesium: 2.2 mg/dL (ref 1.6–2.3)

## 2021-01-15 NOTE — Telephone Encounter (Signed)
Sounds like possible seizures and unlikely to have any connection with her CV problems

## 2021-01-15 NOTE — Telephone Encounter (Signed)
Sara [name] states she has enough 1mg  and 0.5mg  capsules of anagrelide for several weeks

## 2021-01-17 ENCOUNTER — Encounter: Payer: Self-pay | Admitting: Internal Medicine

## 2021-01-18 ENCOUNTER — Encounter: Payer: Self-pay | Admitting: Internal Medicine

## 2021-01-19 ENCOUNTER — Encounter: Payer: Self-pay | Admitting: Nurse Practitioner

## 2021-01-19 ENCOUNTER — Other Ambulatory Visit: Payer: Self-pay

## 2021-01-19 ENCOUNTER — Ambulatory Visit (INDEPENDENT_AMBULATORY_CARE_PROVIDER_SITE_OTHER): Payer: Medicare Other | Admitting: Nurse Practitioner

## 2021-01-19 ENCOUNTER — Telehealth: Payer: Self-pay

## 2021-01-19 VITALS — BP 118/60 | HR 82 | Temp 98.1°F | Ht 62.0 in | Wt 177.0 lb

## 2021-01-19 DIAGNOSIS — E1122 Type 2 diabetes mellitus with diabetic chronic kidney disease: Secondary | ICD-10-CM | POA: Diagnosis not present

## 2021-01-19 DIAGNOSIS — N1831 Chronic kidney disease, stage 3a: Secondary | ICD-10-CM | POA: Diagnosis not present

## 2021-01-19 DIAGNOSIS — R112 Nausea with vomiting, unspecified: Secondary | ICD-10-CM

## 2021-01-19 DIAGNOSIS — N183 Chronic kidney disease, stage 3 unspecified: Secondary | ICD-10-CM

## 2021-01-19 MED ORDER — ONDANSETRON HCL 4 MG PO TABS: 4.0000 mg | ORAL_TABLET | Freq: Every day | ORAL | 1 refills | Status: DC | PRN

## 2021-01-19 MED ORDER — GVOKE HYPOPEN 2-PACK 0.5 MG/0.1ML ~~LOC~~ SOAJ: 1.0000 | SUBCUTANEOUS | 2 refills | Status: DC | PRN

## 2021-01-19 MED ORDER — ONETOUCH VERIO VI STRP: ORAL_STRIP | 3 refills | Status: DC

## 2021-01-19 NOTE — Progress Notes (Signed)
I,[name],acting as a Education administrator for [employer], FNP.,have documented all relevant documentation on the behalf of [name], FNP,as directed by  [name], FNP while in the presence of [name], [place].  This visit occurred during the SARS-CoV-2 public health emergency.  Safety protocols were in place, including screening questions prior to the visit, additional usage of staff PPE, and extensive cleaning of exam room while observing appropriate contact time as indicated for disinfecting solutions.  Subjective:     Patient ID: Sara [name] , female    DOB: 06-03-1956 , 65 y.o.   MRN: [id number]   Chief Complaint  Patient presents with  . Nausea    Patient stated she has been vomiting since this morning. She has been unable to keep anything down. She stated last night her stomach was hurting. Patient's blood sugar is 143.     HPI  She has a new libre placed and she has been feeling bad since Monday.  Thursday ate at [employer]. Her husband had salad too that he had diarrhea.   She reports she took Miralax yesterday - due to her feeling like she may be constipated.  She is not able to keep food and fluid down. She is also nauseated - vomiting coming out of her nose and mouth. She has not checked for Covid.     Past Medical History:  Diagnosis Date  . Diabetes mellitus   . Gout   . Hyperlipemia   . Hypertension   . LVH (left ventricular hypertrophy)    12/21/09 Echo -mild asymmetric LVH,EF =>55%  . Morbid obesity ([place])   . OSA (obstructive sleep apnea)   . PAD (peripheral artery disease) ([place])   . Renal artery stenosis ([place]) 12/13/2010   PTCA and Stent - right  . Seizures ([place])   . Syncopal episodes   . Thrombocytosis      Family History  Problem Relation Age of Onset  . Heart failure Mother   . Diabetes Father   . Diabetes Sister   . Hypertension Sister   . Hypertension Sister   . Hypertension Sister   . Breast cancer Maternal Aunt        2  maternal aunts had breast cancer.  . Breast cancer Maternal Grandmother      Current Outpatient Medications:  .  anagrelide ([name]) 0.5 MG capsule, Take 1 capsule (0.5 mg total) by mouth at bedtime. On Mondays, Wednesdays and Fridays, Disp: 15 capsule, Rfl: 5 .  anagrelide ([name]) 1 MG capsule, Take 1 capsule (1 mg total) by mouth daily., Disp: 30 capsule, Rfl: 5 .  aspirin EC 81 MG tablet, Take 1 tablet (81 mg total) by mouth daily. Swallow whole. (Patient taking differently: Take 81 mg by mouth daily. 2 tabs at bedtime), Disp: 150 tablet, Rfl: 2 .  atorvastatin (LIPITOR) 40 MG tablet, TAKE 1 TABLET BY MOUTH DAILY AT 6 PM, Disp: 90 tablet, Rfl: 2 .  B Complex Vitamins (B COMPLEX PO), Take 1 tablet by mouth daily. , Disp: , Rfl:  .  Blood Glucose Monitoring Suppl (ONETOUCH VERIO IQ SYSTEM) w/Device KIT, Use as directed to check blood sugars 1 time per day dx: e11.22, Disp: 1 kit, Rfl: 1 .  chlorthalidone (HYGROTON) 25 MG tablet, Take 12.5 mg by mouth daily., Disp: , Rfl:  .  Cholecalciferol (VITAMIN D3) 2000 UNITS capsule, Take 2,000 Units by mouth daily. , Disp: , Rfl:  .  glucose blood (ONETOUCH VERIO) test strip, Use  as directed to check blood sugars 2 times per day dx: e11.65, Disp: 300 strip, Rfl: 3 .  Lacosamide ([name]) 100 MG TABS, Take one tablet in AM LOT [id number], 2/26. One pack of 14 tabs, Disp: 14 tablet, Rfl: 0 .  Lacosamide ([name]) 150 MG TABS, Take one tablet in PM, Disp: 14 tablet, Rfl: 0 .  metoprolol tartrate (LOPRESSOR) 50 MG tablet, Take 1.5 tablets (75 mg total) by mouth 2 (two) times daily., Disp: 90 tablet, Rfl: 3 .  Multiple Vitamin (MULTIVITAMIN) capsule, Take 1 capsule by mouth daily., Disp: , Rfl:  .  ondansetron (ZOFRAN) 4 MG tablet, Take 1 tablet (4 mg total) by mouth daily as needed for nausea or vomiting., Disp: 30 tablet, Rfl: 1 .  OneTouch Delica Lancets 47S MISC, USE AS DIRECTED TO CHECK BLOOD SUGAR LEVELS ONCE DAILY, Disp: 200 each, Rfl: 2 .  Specialty  Vitamins Products (MAGNESIUM, AMINO ACID CHELATE,) 133 MG tablet, Take 1 tablet by mouth 2 (two) times daily., Disp: , Rfl:  .  telmisartan (MICARDIS) 80 MG tablet, TAKE 1 TABLET BY MOUTH EVERY DAY, Disp: 90 tablet, Rfl: 1 .  [name] 100 MG TABS, Take 1 tablet in the morning, take 1.5 tablets in the evening, Disp: 210 tablet, Rfl: 1   Allergies  Allergen Reactions  . Sulfa Antibiotics Itching     Review of Systems  Constitutional: Negative.  Negative for fatigue.  Respiratory: Negative.   Cardiovascular: Negative.   Gastrointestinal: Positive for constipation, nausea and vomiting. Negative for abdominal distention, abdominal pain and diarrhea.  Neurological: Negative for dizziness and headaches.  Psychiatric/Behavioral: Negative.      Today's Vitals   01/19/21 1617  BP: 112/60  Pulse: 82  Temp: 98.1 F (36.7 C)  TempSrc: Oral  Weight: 177 lb (80.3 kg)  Height: $Remove'5\' 2"'BsLHAGo$  (1.575 m)  PainSc: 0-No pain   Body mass index is 32.37 kg/m.   Objective:  Physical Exam Vitals reviewed.  Constitutional:      General: She is not in acute distress.    Appearance: Normal appearance.  Cardiovascular:     Rate and Rhythm: Normal rate and regular rhythm.     Pulses: Normal pulses.     Heart sounds: Normal heart sounds. No murmur heard.   Pulmonary:     Effort: Pulmonary effort is normal. No respiratory distress.     Breath sounds: Normal breath sounds. No wheezing.  Neurological:     General: No focal deficit present.     Mental Status: She is alert and oriented to person, place, and time.     Cranial Nerves: No cranial nerve deficit.     Motor: No weakness.  Psychiatric:        Mood and Affect: Mood normal.        Behavior: Behavior normal.        Thought Content: Thought content normal.        Judgment: Judgment normal.         Assessment And Plan:     1. Non-intractable vomiting with nausea, unspecified vomiting type  Blood sugar with [name] is 142, demonstrated how she  uses the phone as a scanner  I will check for covid due to the vomiting and feeling bad  She is encouraged to drink clear liquids and broth while nauseated and vomiting.  This is likely gastritis  I reassured her the [place] sensor is not the cause of how she is feeling - ondansetron (ZOFRAN) 4 MG tablet; Take 1 tablet (  4 mg total) by mouth daily as needed for nausea or vomiting.  Dispense: 30 tablet; Refill: 1 - CBC - BMP8+eGFR - Novel Coronavirus, NAA ([facility])   2. Type 2 diabetes mellitus with stage 3 chronic kidney disease, without long-term current use of insulin, unspecified whether stage 3a or 3b CKD ([place])  GVOKE education given and she is to use if her blood sugar drops below 60, she is to call office if blood sugar is greater than 150. - [name] 0.5 MG/0.1ML SOAJ; Inject 1 each into the skin as needed.  Dispense: 0.2 mL; Refill: 2   Patient was given opportunity to ask questions. Patient verbalized understanding of the plan and was able to repeat key elements of the plan. All questions were answered to their satisfaction.  [name], FNP   I, [name], FNP, have reviewed all documentation for this visit. The documentation on 01/19/21 for the exam, diagnosis, procedures, and orders are all accurate and complete.   IF YOU HAVE BEEN REFERRED TO A SPECIALIST, IT MAY TAKE 1-2 WEEKS TO SCHEDULE/PROCESS THE REFERRAL. IF YOU HAVE NOT HEARD FROM US/SPECIALIST IN TWO WEEKS, PLEASE GIVE [place] A CALL AT [phone].   THE PATIENT IS ENCOURAGED TO PRACTICE SOCIAL DISTANCING DUE TO THE COVID-19 PANDEMIC.

## 2021-01-19 NOTE — Telephone Encounter (Signed)
The pt was contacted and she said that she is going to try and make it to her appt,

## 2021-01-19 NOTE — Telephone Encounter (Signed)
The pt called and said that she thinks the Roberts sensor is making her sick.  The pt was told to check her sugar to make sure it's not her sugar because the [place] doesn't have any medicine in it, the pt is going to check her sugar and call back with the reading.

## 2021-01-19 NOTE — Patient Instructions (Signed)
   If blood sugars are below 60 take GVOKE as needed. If greater than 150 call to office to make [place] aware.

## 2021-01-20 ENCOUNTER — Telehealth: Payer: Self-pay

## 2021-01-20 LAB — BMP8+EGFR
BUN/Creatinine Ratio: 9 — ABNORMAL LOW (ref 12–28)
BUN: 18 mg/dL (ref 8–27)
CO2: 22 mmol/L (ref 20–29)
Calcium: 11.2 mg/dL — ABNORMAL HIGH (ref 8.7–10.3)
Chloride: 79 mmol/L — ABNORMAL LOW (ref 96–106)
Creatinine, Ser: 1.93 mg/dL — ABNORMAL HIGH (ref 0.57–1.00)
Glucose: 138 mg/dL — ABNORMAL HIGH (ref 65–99)
Potassium: 3.9 mmol/L (ref 3.5–5.2)
Sodium: 125 mmol/L — ABNORMAL LOW (ref 134–144)
eGFR: 28 mL/min/{1.73_m2} — ABNORMAL LOW (ref >59)

## 2021-01-20 LAB — CBC
Hematocrit: 41.9 % (ref 34.0–46.6)
Hemoglobin: 14.8 g/dL (ref 11.1–15.9)
MCH: 29.4 pg (ref 26.6–33.0)
MCHC: 35.3 g/dL (ref 31.5–35.7)
MCV: 83 fL (ref 79–97)
Platelets: 338 10*3/uL (ref 150–450)
RBC: 5.03 x10E6/uL (ref 3.77–5.28)
RDW: 13.4 % (ref 11.7–15.4)
WBC: 19 10*3/uL — ABNORMAL HIGH (ref 3.4–10.8)

## 2021-01-20 LAB — NOVEL CORONAVIRUS, NAA: SARS-CoV-2, NAA: NOT DETECTED

## 2021-01-20 LAB — SARS-COV-2, NAA 2 DAY TAT

## 2021-01-20 NOTE — Telephone Encounter (Signed)
Patient is aware and has already made plans to follow up with neurology.

## 2021-01-20 NOTE — Telephone Encounter (Signed)
Dr.  Baird Cancer,  Good  morning  I want  to  know  why my  stomach has  has been  hurting  for  the  last  three  days   I have  not  been  drinking  any  of  the  beet juice  since  last  Wednesday.  Would  that  have  any  thing to  do  with  that?  Thanks  Sara [name]  THE PT WAS IN FOR AN APPT YESTERDAY.

## 2021-01-22 ENCOUNTER — Ambulatory Visit
Admission: EM | Admit: 2021-01-22 | Discharge: 2021-01-22 | Disposition: A | Discharge status: Home / Self Care | Payer: Medicare Other | Attending: Urgent Care | Admitting: Urgent Care

## 2021-01-22 ENCOUNTER — Encounter (HOSPITAL_COMMUNITY): Payer: Self-pay | Admitting: Family Medicine

## 2021-01-22 ENCOUNTER — Inpatient Hospital Stay (HOSPITAL_COMMUNITY)
Admission: EM | Admit: 2021-01-22 | Discharge: 2021-01-28 | DRG: 683 | Disposition: A | Discharge status: Home / Self Care | Payer: Medicare Other | Attending: Internal Medicine | Admitting: Internal Medicine

## 2021-01-22 ENCOUNTER — Emergency Department (HOSPITAL_COMMUNITY): Payer: Medicare Other

## 2021-01-22 ENCOUNTER — Other Ambulatory Visit: Payer: Self-pay

## 2021-01-22 DIAGNOSIS — J9811 Atelectasis: Secondary | ICD-10-CM | POA: Diagnosis not present

## 2021-01-22 DIAGNOSIS — I252 Old myocardial infarction: Secondary | ICD-10-CM | POA: Diagnosis not present

## 2021-01-22 DIAGNOSIS — R569 Unspecified convulsions: Secondary | ICD-10-CM

## 2021-01-22 DIAGNOSIS — K439 Ventral hernia without obstruction or gangrene: Secondary | ICD-10-CM | POA: Diagnosis present

## 2021-01-22 DIAGNOSIS — N179 Acute kidney failure, unspecified: Secondary | ICD-10-CM | POA: Diagnosis not present

## 2021-01-22 DIAGNOSIS — I2510 Atherosclerotic heart disease of native coronary artery without angina pectoris: Secondary | ICD-10-CM | POA: Diagnosis not present

## 2021-01-22 DIAGNOSIS — N1831 Chronic kidney disease, stage 3a: Secondary | ICD-10-CM

## 2021-01-22 DIAGNOSIS — E785 Hyperlipidemia, unspecified: Secondary | ICD-10-CM | POA: Diagnosis not present

## 2021-01-22 DIAGNOSIS — R109 Unspecified abdominal pain: Secondary | ICD-10-CM

## 2021-01-22 DIAGNOSIS — Z6832 Body mass index (BMI) 32.0-32.9, adult: Secondary | ICD-10-CM

## 2021-01-22 DIAGNOSIS — E871 Hypo-osmolality and hyponatremia: Secondary | ICD-10-CM | POA: Diagnosis present

## 2021-01-22 DIAGNOSIS — E11649 Type 2 diabetes mellitus with hypoglycemia without coma: Secondary | ICD-10-CM | POA: Diagnosis not present

## 2021-01-22 DIAGNOSIS — Z0189 Encounter for other specified special examinations: Secondary | ICD-10-CM

## 2021-01-22 DIAGNOSIS — Z79899 Other long term (current) drug therapy: Secondary | ICD-10-CM

## 2021-01-22 DIAGNOSIS — R112 Nausea with vomiting, unspecified: Secondary | ICD-10-CM | POA: Insufficient documentation

## 2021-01-22 DIAGNOSIS — R5383 Other fatigue: Secondary | ICD-10-CM

## 2021-01-22 DIAGNOSIS — K529 Noninfective gastroenteritis and colitis, unspecified: Secondary | ICD-10-CM | POA: Diagnosis present

## 2021-01-22 DIAGNOSIS — K56609 Unspecified intestinal obstruction, unspecified as to partial versus complete obstruction: Secondary | ICD-10-CM | POA: Diagnosis not present

## 2021-01-22 DIAGNOSIS — Z90711 Acquired absence of uterus with remaining cervical stump: Secondary | ICD-10-CM

## 2021-01-22 DIAGNOSIS — E669 Obesity, unspecified: Secondary | ICD-10-CM | POA: Diagnosis present

## 2021-01-22 DIAGNOSIS — G4733 Obstructive sleep apnea (adult) (pediatric): Secondary | ICD-10-CM | POA: Diagnosis present

## 2021-01-22 DIAGNOSIS — I9589 Other hypotension: Secondary | ICD-10-CM

## 2021-01-22 DIAGNOSIS — I255 Ischemic cardiomyopathy: Secondary | ICD-10-CM | POA: Diagnosis present

## 2021-01-22 DIAGNOSIS — G4730 Sleep apnea, unspecified: Secondary | ICD-10-CM | POA: Diagnosis not present

## 2021-01-22 DIAGNOSIS — K436 Other and unspecified ventral hernia with obstruction, without gangrene: Secondary | ICD-10-CM | POA: Diagnosis not present

## 2021-01-22 DIAGNOSIS — R42 Dizziness and giddiness: Secondary | ICD-10-CM | POA: Diagnosis not present

## 2021-01-22 DIAGNOSIS — Z20822 Contact with and (suspected) exposure to covid-19: Secondary | ICD-10-CM | POA: Diagnosis present

## 2021-01-22 DIAGNOSIS — Z4682 Encounter for fitting and adjustment of non-vascular catheter: Secondary | ICD-10-CM | POA: Diagnosis not present

## 2021-01-22 DIAGNOSIS — G40909 Epilepsy, unspecified, not intractable, without status epilepticus: Secondary | ICD-10-CM | POA: Diagnosis present

## 2021-01-22 DIAGNOSIS — D473 Essential (hemorrhagic) thrombocythemia: Secondary | ICD-10-CM | POA: Diagnosis not present

## 2021-01-22 DIAGNOSIS — E1151 Type 2 diabetes mellitus with diabetic peripheral angiopathy without gangrene: Secondary | ICD-10-CM | POA: Diagnosis not present

## 2021-01-22 DIAGNOSIS — Z833 Family history of diabetes mellitus: Secondary | ICD-10-CM

## 2021-01-22 DIAGNOSIS — R9431 Abnormal electrocardiogram [ECG] [EKG]: Secondary | ICD-10-CM | POA: Diagnosis not present

## 2021-01-22 DIAGNOSIS — Z7982 Long term (current) use of aspirin: Secondary | ICD-10-CM

## 2021-01-22 DIAGNOSIS — M109 Gout, unspecified: Secondary | ICD-10-CM | POA: Diagnosis not present

## 2021-01-22 DIAGNOSIS — E876 Hypokalemia: Secondary | ICD-10-CM | POA: Diagnosis present

## 2021-01-22 DIAGNOSIS — I5032 Chronic diastolic (congestive) heart failure: Secondary | ICD-10-CM | POA: Diagnosis present

## 2021-01-22 DIAGNOSIS — I130 Hypertensive heart and chronic kidney disease with heart failure and stage 1 through stage 4 chronic kidney disease, or unspecified chronic kidney disease: Secondary | ICD-10-CM | POA: Diagnosis not present

## 2021-01-22 DIAGNOSIS — I959 Hypotension, unspecified: Secondary | ICD-10-CM | POA: Diagnosis not present

## 2021-01-22 DIAGNOSIS — E1122 Type 2 diabetes mellitus with diabetic chronic kidney disease: Secondary | ICD-10-CM | POA: Diagnosis not present

## 2021-01-22 DIAGNOSIS — Z8249 Family history of ischemic heart disease and other diseases of the circulatory system: Secondary | ICD-10-CM

## 2021-01-22 DIAGNOSIS — E162 Hypoglycemia, unspecified: Secondary | ICD-10-CM | POA: Diagnosis not present

## 2021-01-22 DIAGNOSIS — R197 Diarrhea, unspecified: Secondary | ICD-10-CM

## 2021-01-22 DIAGNOSIS — K6389 Other specified diseases of intestine: Secondary | ICD-10-CM | POA: Diagnosis not present

## 2021-01-22 DIAGNOSIS — E861 Hypovolemia: Secondary | ICD-10-CM | POA: Diagnosis present

## 2021-01-22 DIAGNOSIS — R0902 Hypoxemia: Secondary | ICD-10-CM | POA: Diagnosis not present

## 2021-01-22 DIAGNOSIS — E860 Dehydration: Secondary | ICD-10-CM | POA: Diagnosis not present

## 2021-01-22 DIAGNOSIS — K56699 Other intestinal obstruction unspecified as to partial versus complete obstruction: Secondary | ICD-10-CM | POA: Diagnosis not present

## 2021-01-22 DIAGNOSIS — Z882 Allergy status to sulfonamides status: Secondary | ICD-10-CM

## 2021-01-22 DIAGNOSIS — Z951 Presence of aortocoronary bypass graft: Secondary | ICD-10-CM

## 2021-01-22 DIAGNOSIS — E119 Type 2 diabetes mellitus without complications: Secondary | ICD-10-CM | POA: Diagnosis not present

## 2021-01-22 LAB — COMPREHENSIVE METABOLIC PANEL
ALT: 22 U/L (ref 0–44)
AST: 25 U/L (ref 15–41)
Albumin: 2.8 g/dL — ABNORMAL LOW (ref 3.5–5.0)
Alkaline Phosphatase: 67 U/L (ref 38–126)
Anion gap: 19 — ABNORMAL HIGH (ref 5–15)
BUN: 83 mg/dL — ABNORMAL HIGH (ref 8–23)
CO2: 25 mmol/L (ref 22–32)
Calcium: 8.2 mg/dL — ABNORMAL LOW (ref 8.9–10.3)
Chloride: 74 mmol/L — ABNORMAL LOW (ref 98–111)
Creatinine, Ser: 6.02 mg/dL — ABNORMAL HIGH (ref 0.44–1.00)
GFR, Estimated: 7 mL/min — ABNORMAL LOW (ref >60)
Glucose, Bld: 81 mg/dL (ref 70–99)
Potassium: 3.2 mmol/L — ABNORMAL LOW (ref 3.5–5.1)
Sodium: 118 mmol/L — CL (ref 135–145)
Total Bilirubin: 1 mg/dL (ref 0.3–1.2)
Total Protein: 6.3 g/dL — ABNORMAL LOW (ref 6.5–8.1)

## 2021-01-22 LAB — CBC WITH DIFFERENTIAL/PLATELET
Abs Immature Granulocytes: 0 10*3/uL (ref 0.00–0.07)
Basophils Absolute: 0 10*3/uL (ref 0.0–0.1)
Basophils Relative: 0 %
Eosinophils Absolute: 0.2 10*3/uL (ref 0.0–0.5)
Eosinophils Relative: 3 %
HCT: 39.2 % (ref 36.0–46.0)
Hemoglobin: 13.6 g/dL (ref 12.0–15.0)
Lymphocytes Relative: 8 %
Lymphs Abs: 0.6 10*3/uL — ABNORMAL LOW (ref 0.7–4.0)
MCH: 29.2 pg (ref 26.0–34.0)
MCHC: 34.7 g/dL (ref 30.0–36.0)
MCV: 84.3 fL (ref 80.0–100.0)
Monocytes Absolute: 1.4 10*3/uL — ABNORMAL HIGH (ref 0.1–1.0)
Monocytes Relative: 17 %
Neutro Abs: 5.8 10*3/uL (ref 1.7–7.7)
Neutrophils Relative %: 72 %
Platelets: 246 10*3/uL (ref 150–400)
RBC: 4.65 MIL/uL (ref 3.87–5.11)
RDW: 13.5 % (ref 11.5–15.5)
WBC: 8.1 10*3/uL (ref 4.0–10.5)
nRBC: 0 % (ref 0.0–0.2)
nRBC: 0 /100 WBC

## 2021-01-22 LAB — RESP PANEL BY RT-PCR (FLU A&B, COVID) ARPGX2
Influenza A by PCR: NEGATIVE
Influenza B by PCR: NEGATIVE
SARS Coronavirus 2 by RT PCR: NEGATIVE

## 2021-01-22 LAB — POCT FASTING CBG KUC MANUAL ENTRY: POCT Glucose (KUC): 108 mg/dL — AB (ref 70–99)

## 2021-01-22 LAB — BRAIN NATRIURETIC PEPTIDE: B Natriuretic Peptide: 376.8 pg/mL — ABNORMAL HIGH (ref 0.0–100.0)

## 2021-01-22 LAB — LIPASE, BLOOD: Lipase: 24 U/L (ref 11–51)

## 2021-01-22 LAB — TSH: TSH: 0.845 u[IU]/mL (ref 0.350–4.500)

## 2021-01-22 MED ORDER — SODIUM CHLORIDE 0.9 % IV SOLN: INTRAVENOUS | Status: DC

## 2021-01-22 MED ORDER — SODIUM CHLORIDE 0.9 % IV BOLUS
1000.0000 mL | Freq: Once | INTRAVENOUS | Status: AC
  Administered 2021-01-22: 1000 mL via INTRAVENOUS

## 2021-01-22 MED ORDER — ACETAMINOPHEN 325 MG PO TABS: 650.0000 mg | ORAL_TABLET | Freq: Four times a day (QID) | ORAL | Status: DC | PRN

## 2021-01-22 MED ORDER — LACOSAMIDE 50 MG PO TABS
150.0000 mg | ORAL_TABLET | Freq: Every evening | ORAL | Status: DC
  Administered 2021-01-22 – 2021-01-23 (×2): 150 mg via ORAL
  Filled 2021-01-22 (×2): qty 3

## 2021-01-22 MED ORDER — LACOSAMIDE 50 MG PO TABS
100.0000 mg | ORAL_TABLET | Freq: Every morning | ORAL | Status: DC
  Administered 2021-01-23: 100 mg via ORAL
  Filled 2021-01-22: qty 2

## 2021-01-22 MED ORDER — ASPIRIN EC 81 MG PO TBEC
81.0000 mg | DELAYED_RELEASE_TABLET | Freq: Every day | ORAL | Status: DC
  Administered 2021-01-23: 81 mg via ORAL
  Filled 2021-01-22: qty 1

## 2021-01-22 MED ORDER — HEPARIN SODIUM (PORCINE) 5000 UNIT/ML IJ SOLN
5000.0000 [IU] | Freq: Three times a day (TID) | INTRAMUSCULAR | Status: DC
  Administered 2021-01-22 – 2021-01-28 (×17): 5000 [IU] via SUBCUTANEOUS
  Filled 2021-01-22 (×17): qty 1

## 2021-01-22 MED ORDER — ANAGRELIDE HCL 0.5 MG PO CAPS: 0.5000 mg | ORAL_CAPSULE | ORAL | Status: DC

## 2021-01-22 MED ORDER — LACOSAMIDE 100 MG PO TABS: 1.0000 | ORAL_TABLET | ORAL | Status: DC

## 2021-01-22 MED ORDER — ACETAMINOPHEN 650 MG RE SUPP: 650.0000 mg | Freq: Four times a day (QID) | RECTAL | Status: DC | PRN

## 2021-01-22 MED ORDER — ONDANSETRON HCL 4 MG/2ML IJ SOLN
4.0000 mg | Freq: Four times a day (QID) | INTRAMUSCULAR | Status: DC | PRN
  Administered 2021-01-23: 4 mg via INTRAVENOUS
  Filled 2021-01-22: qty 2

## 2021-01-22 MED ORDER — ANAGRELIDE HCL 0.5 MG PO CAPS
1.0000 mg | ORAL_CAPSULE | Freq: Every day | ORAL | Status: DC
  Administered 2021-01-23: 1 mg via ORAL
  Filled 2021-01-22: qty 2

## 2021-01-22 MED ORDER — SODIUM CHLORIDE 0.9 % IV BOLUS
500.0000 mL | Freq: Once | INTRAVENOUS | Status: AC
  Administered 2021-01-22: 500 mL via INTRAVENOUS

## 2021-01-22 MED ORDER — ONDANSETRON HCL 4 MG PO TABS: 4.0000 mg | ORAL_TABLET | Freq: Four times a day (QID) | ORAL | Status: DC | PRN

## 2021-01-22 MED ORDER — ATORVASTATIN CALCIUM 40 MG PO TABS
40.0000 mg | ORAL_TABLET | Freq: Every day | ORAL | Status: DC
  Administered 2021-01-23: 40 mg via ORAL
  Filled 2021-01-22: qty 1

## 2021-01-22 NOTE — ED Provider Notes (Signed)
[facility]   MRN: [id number] DOB: April 19, 1956  Subjective:   Sara [name] is a 65 y.o. female presenting for 7-day history of persistent nausea with vomiting, diarrhea, intermittent abdominal pain and shortness of breath.  In the past couple of days patient's husband reports that she has been more confused and lethargic.  Reports that symptoms started after she ate out.  She has a history of diastolic CHF, type 2 diabetes, acute renal failure, CKD, heart disease status post CABG x2.  Denies fever, vision change, chest pain.   No current facility-administered medications for this encounter.  Current Outpatient Medications:  .  anagrelide ([name]) 0.5 MG capsule, Take 1 capsule (0.5 mg total) by mouth at bedtime. On Mondays, Wednesdays and Fridays, Disp: 15 capsule, Rfl: 5 .  anagrelide ([name]) 1 MG capsule, Take 1 capsule (1 mg total) by mouth daily., Disp: 30 capsule, Rfl: 5 .  aspirin EC 81 MG tablet, Take 1 tablet (81 mg total) by mouth daily. Swallow whole. (Patient taking differently: Take 81 mg by mouth daily. 2 tabs at bedtime), Disp: 150 tablet, Rfl: 2 .  atorvastatin (LIPITOR) 40 MG tablet, TAKE 1 TABLET BY MOUTH DAILY AT 6 PM, Disp: 90 tablet, Rfl: 2 .  B Complex Vitamins (B COMPLEX PO), Take 1 tablet by mouth daily. , Disp: , Rfl:  .  Blood Glucose Monitoring Suppl (ONETOUCH VERIO IQ SYSTEM) w/Device KIT, Use as directed to check blood sugars 1 time per day dx: e11.22, Disp: 1 kit, Rfl: 1 .  chlorthalidone (HYGROTON) 25 MG tablet, Take 12.5 mg by mouth daily., Disp: , Rfl:  .  Cholecalciferol (VITAMIN D3) 2000 UNITS capsule, Take 2,000 Units by mouth daily. , Disp: , Rfl:  .  [name] 0.5 MG/0.1ML SOAJ, Inject 1 each into the skin as needed., Disp: 0.2 mL, Rfl: 2 .  glucose blood (ONETOUCH VERIO) test strip, Use as directed to check blood sugars 2 times per day dx: e11.65, Disp: 300 strip, Rfl: 3 .  Lacosamide ([name]) 100 MG TABS, Take  one tablet in AM LOT [id number], 2/26. One pack of 14 tabs, Disp: 14 tablet, Rfl: 0 .  Lacosamide ([name]) 150 MG TABS, Take one tablet in PM, Disp: 14 tablet, Rfl: 0 .  metoprolol tartrate (LOPRESSOR) 50 MG tablet, Take 1.5 tablets (75 mg total) by mouth 2 (two) times daily., Disp: 90 tablet, Rfl: 3 .  Multiple Vitamin (MULTIVITAMIN) capsule, Take 1 capsule by mouth daily., Disp: , Rfl:  .  ondansetron (ZOFRAN) 4 MG tablet, Take 1 tablet (4 mg total) by mouth daily as needed for nausea or vomiting., Disp: 30 tablet, Rfl: 1 .  OneTouch Delica Lancets 62E MISC, USE AS DIRECTED TO CHECK BLOOD SUGAR LEVELS ONCE DAILY, Disp: 200 each, Rfl: 2 .  Specialty Vitamins Products (MAGNESIUM, AMINO ACID CHELATE,) 133 MG tablet, Take 1 tablet by mouth 2 (two) times daily., Disp: , Rfl:  .  telmisartan (MICARDIS) 80 MG tablet, TAKE 1 TABLET BY MOUTH EVERY DAY, Disp: 90 tablet, Rfl: 1 .  [name] 100 MG TABS, Take 1 tablet in the morning, take 1.5 tablets in the evening, Disp: 210 tablet, Rfl: 1   Allergies  Allergen Reactions  . Sulfa Antibiotics Itching    Past Medical History:  Diagnosis Date  . Diabetes mellitus   . Gout   . Hyperlipemia   . Hypertension   . LVH (left ventricular hypertrophy)    12/21/09 Echo -mild asymmetric LVH,EF =>55%  . Morbid  obesity ([place])   . OSA (obstructive sleep apnea)   . PAD (peripheral artery disease) ([place])   . Renal artery stenosis ([place]) 12/13/2010   PTCA and Stent - right  . Seizures ([place])   . Syncopal episodes   . Thrombocytosis      Past Surgical History:  Procedure Laterality Date  . BREAST CYST EXCISION     S/P Benign Right Breast Cyst Removal.  . BREAST REDUCTION SURGERY  1995   S/P Bilateral breast reduction  . [place]   Times  Two.  . CORONARY ARTERY BYPASS GRAFT N/A 04/21/2017   Procedure: CORONARY ARTERY BYPASS GRAFTING (CABG) x 2, USING LEFT MAMMARY ARTERY AND RIGHT GREATER SAPHENOUS VEIN HARVESTED ENDOSCOPICALLY;  Surgeon: [name], MD;  Location: [facility];  Service: Open Heart Surgery;  Laterality: N/A;  . EXTREMITY CYST EXCISION  1993   left wrist  . LEFT HEART CATH AND CORONARY ANGIOGRAPHY N/A 04/17/2017   Procedure: Left Heart Cath and Coronary Angiography;  Surgeon: Jettie Booze, MD;  Location: [facility];  Service: Cardiovascular;  Laterality: N/A;  . PARTIAL HYSTERECTOMY  1994  . renal artery stent placement Right   . TEE WITHOUT CARDIOVERSION N/A 04/21/2017   Procedure: TRANSESOPHAGEAL ECHOCARDIOGRAM (TEE);  Surgeon: [name], MD;  Location: [facility];  Service: Open Heart Surgery;  Laterality: N/A;    Family History  Problem Relation Age of Onset  . Heart failure Mother   . Diabetes Father   . Diabetes Sister   . Hypertension Sister   . Hypertension Sister   . Hypertension Sister   . Breast cancer Maternal Aunt        2 maternal aunts had breast cancer.  . Breast cancer Maternal Grandmother     Social History   Tobacco Use  . Smoking status: Never Smoker  . Smokeless tobacco: Never Used  Vaping Use  . Vaping Use: Never used  Substance Use Topics  . Alcohol use: No    Alcohol/week: 0.0 standard drinks  . Drug use: No    ROS   Objective:   Vitals: BP (!) 72/55 (BP Location: Left Arm)   Pulse (!) 101   Resp 20   SpO2 90%   BP Readings from Last 3 Encounters:  01/22/21 (!) 72/55  01/19/21 118/60  11/26/20 136/73   Physical Exam Constitutional:      General: She is not in acute distress.    Appearance: Normal appearance. She is well-developed. She is obese. She is ill-appearing. She is not toxic-appearing or diaphoretic.     Comments: Lethargic, delayed in her responses.  HENT:     Head: Normocephalic and atraumatic.     Nose: Nose normal.     Mouth/Throat:     Mouth: Mucous membranes are moist.  Eyes:     Extraocular Movements: Extraocular movements intact.     Pupils: Pupils are equal, round, and reactive to light.  Cardiovascular:     Rate and  Rhythm: Normal rate and regular rhythm.     Pulses: Normal pulses.     Heart sounds: Normal heart sounds. No murmur heard. No friction rub. No gallop.   Pulmonary:     Effort: Pulmonary effort is normal. No respiratory distress.     Breath sounds: Normal breath sounds. No stridor. No wheezing, rhonchi or rales.  Abdominal:     General: Bowel sounds are normal. There is no distension.     Palpations: Abdomen is  soft. There is no mass.     Tenderness: There is no abdominal tenderness. There is no right CVA tenderness, left CVA tenderness, guarding or rebound.  Skin:    General: Skin is warm and dry.     Findings: No rash.  Neurological:     Mental Status: She is oriented to person, place, and time.     Results for orders placed or performed during the hospital encounter of 01/22/21 (from the past 24 hour(s))  POCT CBG (manual entry)     Status: Abnormal   Collection Time: 01/22/21  2:40 PM  Result Value Ref Range   POCT Glucose ([facility]) 108 (A) 70 - 99 mg/dL   A peripheral IV line was started over the right arm urgently.  Started a bolus of normal saline prior to EMS arrival.  Assessment and Plan :   PDMP not reviewed this encounter.  1. Lethargic   2. Nausea vomiting and diarrhea   3. Hypotension, unspecified hypotension type     Patient was severely hypotensive at triage and therefore intervene urgently.  She was found to also be lethargic, intermittently tachycardic and hypoxic.  Pulse ranged from 93bpm-116bpm, hypoxia from 86%-94%.  Patient has significant risk factors and therefore EMS was called emergently.  Case reported out prior to being able to obtain an EKG, transfer of care completed for transport to the hospital for further evaluation and intervention.   Jaynee Eagles, PA-C 01/22/21 [id number]

## 2021-01-22 NOTE — H&P (Signed)
History and Physical    LELAND STASZEWSKI DJS:[id number] DOB: 1956/05/15 DOA: 01/22/2021  PCP: [name], MD   Patient coming from: Home   Chief Complaint: Nausea, vomiting, diarrhea   HPI: NYCHELLE CASSATA is a 65 y.o. female with medical history significant for CAD, mild ischemic cardiomyopathy, OSA on CPAP, seizures, chronic kidney disease stage IIIa, hypertension, and essential thrombocythemia, now presenting to the emergency department for evaluation of nausea, vomiting, and diarrhea.  Patient reports that she developed severe nausea and nonbloody vomiting about 1 week ago.  Her husband was having diarrhea at the time but the patient thought that she might be constipated and was taking laxatives.  She has continued to have nausea and anorexia, and has also gone on to develop diarrhea with at least 4 episodes daily.  She has had some intermittent abdominal discomfort with this but no abdominal pain today.  She has continued to take her medications as prescribed.  Over the past couple days, she has had progressive lethargy, seem to have some mild confusion, and this prompted family to bring her to urgent care today.  She was noted to have blood pressure of 72/55 at urgent care and sent to the ED.  ED Course: Upon arrival to the ED, patient is found to be afebrile, saturating well on room air, and systolic blood pressure 885-0 20.  EKG features sinus rhythm and chest x-ray notable for hazy atelectasis at the right base.  Chemistry panel concerning for sodium 119, potassium 3.2, BUN 83, and creatinine 6.02, up from a baseline of 1.3.  CBC is unremarkable.  BNP elevated to 377.  ED discussed case with nephrology who recommended holding her ARB and hydrating with IVF.  Patient was given 1.5 L of saline in the ED.  COVID-19 screening test was negative.  Review of Systems:  All other systems reviewed and apart from HPI, are negative.  Past Medical History:  Diagnosis Date  . Diabetes mellitus    . Gout   . Hyperlipemia   . Hypertension   . LVH (left ventricular hypertrophy)    12/21/09 Echo -mild asymmetric LVH,EF =>55%  . Morbid obesity ([place])   . OSA (obstructive sleep apnea)   . PAD (peripheral artery disease) ([place])   . Renal artery stenosis ([place]) 12/13/2010   PTCA and Stent - right  . Seizures ([place])   . Syncopal episodes   . Thrombocytosis     Past Surgical History:  Procedure Laterality Date  . BREAST CYST EXCISION     S/P Benign Right Breast Cyst Removal.  . BREAST REDUCTION SURGERY  1995   S/P Bilateral breast reduction  . [place]   Times  Two.  . CORONARY ARTERY BYPASS GRAFT N/A 04/21/2017   Procedure: CORONARY ARTERY BYPASS GRAFTING (CABG) x 2, USING LEFT MAMMARY ARTERY AND RIGHT GREATER SAPHENOUS VEIN HARVESTED ENDOSCOPICALLY;  Surgeon: [name], MD;  Location: [facility];  Service: Open Heart Surgery;  Laterality: N/A;  . EXTREMITY CYST EXCISION  1993   left wrist  . LEFT HEART CATH AND CORONARY ANGIOGRAPHY N/A 04/17/2017   Procedure: Left Heart Cath and Coronary Angiography;  Surgeon: Jettie Booze, MD;  Location: [facility];  Service: Cardiovascular;  Laterality: N/A;  . PARTIAL HYSTERECTOMY  1994  . renal artery stent placement Right   . TEE WITHOUT CARDIOVERSION N/A 04/21/2017   Procedure: TRANSESOPHAGEAL ECHOCARDIOGRAM (TEE);  Surgeon: [name], MD;  Location: [facility];  Service: Open  Heart Surgery;  Laterality: N/A;    Social History:   reports that she has never smoked. She has never used smokeless tobacco. She reports that she does not drink alcohol and does not use drugs.  Allergies  Allergen Reactions  . Sulfa Antibiotics Itching    Family History  Problem Relation Age of Onset  . Heart failure Mother   . Diabetes Father   . Diabetes Sister   . Hypertension Sister   . Hypertension Sister   . Hypertension Sister   . Breast cancer Maternal Aunt        2 maternal aunts had breast cancer.  . Breast  cancer Maternal Grandmother      Prior to Admission medications   Medication Sig Start Date End Date Taking? Authorizing Provider  anagrelide ([name]) 1 MG capsule Take 1 capsule (1 mg total) by mouth daily. Patient taking differently: Take 1-1.5 mg by mouth See admin instructions. Take 1 mg tablet by mouth every day except on Mon, wednesdays and Fridays take 1.5mg  tablet per patient 09/11/20  Yes Truitt Merle, MD  aspirin EC 81 MG tablet Take 1 tablet (81 mg total) by mouth daily. Swallow whole. Patient taking differently: Take 81 mg by mouth daily. 05/18/20 05/18/21 Yes [name], MD  atorvastatin (LIPITOR) 40 MG tablet TAKE 1 TABLET BY MOUTH DAILY AT 6 PM 09/29/20  Yes Croitoru, Mihai, MD  B Complex Vitamins (B COMPLEX PO) Take 1 tablet by mouth daily.    Yes [provider]  Cholecalciferol (VITAMIN D3) 2000 UNITS capsule Take 2,000 Units by mouth daily.    Yes [provider]  Lacosamide ([name]) 100 MG TABS Take 1 tablet by mouth See admin instructions. Take 100 mg tablet by mouth in the mornings then take 150 mg tablet by mouth in the evening. Patient states she splits the 100 mg in half to make the total dose of 150 mg in the evening per patient   Yes [provider]  Lacosamide ([name]) 150 MG TABS Take one tablet in PM 11/16/20  Yes Ward Givens, NP  metoprolol tartrate (LOPRESSOR) 50 MG tablet Take 1.5 tablets (75 mg total) by mouth 2 (two) times daily. 12/21/20  Yes Croitoru, Mihai, MD  Multiple Vitamin (MULTIVITAMIN) capsule Take 1 capsule by mouth daily.   Yes [provider]  Specialty Vitamins Products (MAGNESIUM, AMINO ACID CHELATE,) 133 MG tablet Take 1 tablet by mouth 2 (two) times daily.   Yes [provider]  telmisartan (MICARDIS) 80 MG tablet TAKE 1 TABLET BY MOUTH EVERY DAY Patient taking differently: Take 80 mg by mouth daily. 12/28/20  Yes [name], MD    Physical Exam: Vitals:   01/22/21 1924 01/22/21 1930 01/22/21  1945 01/22/21 2000  BP: 112/65 110/62 (!) 111/55 (!) 113/52  Pulse: 89 89 89 88  Resp: 20 18 (!) 21 (!) 23  Temp: 98.1 F (36.7 C)     TempSrc: Oral     SpO2: 97% 97% 98% 96%  Weight:      Height:        Constitutional: NAD, calm  Eyes: PERTLA, lids and conjunctivae normal ENMT: Mucous membranes are moist. Posterior pharynx clear of any exudate or lesions.   Neck: normal, supple, no masses, no thyromegaly Respiratory: no wheezing, no crackles. No accessory muscle use.  Cardiovascular: S1 & S2 heard, regular rate and rhythm. Mild pedal edema bilaterally.   Abdomen: No distension, no tenderness, soft. Bowel sounds active.  Musculoskeletal: no clubbing / cyanosis.  No joint deformity upper and lower extremities.   Skin: no significant rashes, lesions, ulcers. Warm, dry, well-perfused. Neurologic: CN 2-12 grossly intact. Sensation intact. Moving all extremities.  Psychiatric: Alert and oriented to person, place, and situation. Calm and cooperative.    Labs and Imaging on Admission: I have personally reviewed following labs and imaging studies  CBC: Recent Labs  Lab 01/19/21 1723 01/22/21 1745  WBC 19.0* 8.1  NEUTROABS  --  5.8  HGB 14.8 13.6  HCT 41.9 39.2  MCV 83 84.3  PLT 338 161   Basic Metabolic Panel: Recent Labs  Lab 01/19/21 1723 01/22/21 1745  NA 125* 118*  K 3.9 3.2*  CL 79* 74*  CO2 22 25  GLUCOSE 138* 81  BUN 18 83*  CREATININE 1.93* 6.02*  CALCIUM 11.2* 8.2*   GFR: Estimated Creatinine Clearance: 9 mL/min (A) (by C-G formula based on SCr of 6.02 mg/dL (H)). Liver Function Tests: Recent Labs  Lab 01/22/21 1745  AST 25  ALT 22  ALKPHOS 67  BILITOT 1.0  PROT 6.3*  ALBUMIN 2.8*   Recent Labs  Lab 01/22/21 1745  LIPASE 24   No results for input(s): AMMONIA in the last 168 hours. Coagulation Profile: No results for input(s): INR, PROTIME in the last 168 hours. Cardiac Enzymes: No results for input(s): CKTOTAL, CKMB, CKMBINDEX, TROPONINI in  the last 168 hours. BNP (last 3 results) No results for input(s): PROBNP in the last 8760 hours. HbA1C: No results for input(s): HGBA1C in the last 72 hours. CBG: No results for input(s): GLUCAP in the last 168 hours. Lipid Profile: No results for input(s): CHOL, HDL, LDLCALC, TRIG, CHOLHDL, LDLDIRECT in the last 72 hours. Thyroid Function Tests: Recent Labs    01/22/21 1745  TSH 0.845   Anemia Panel: No results for input(s): VITAMINB12, FOLATE, FERRITIN, TIBC, IRON, RETICCTPCT in the last 72 hours. Urine analysis:    Component Value Date/Time   COLORURINE STRAW (A) 04/19/2017 1712   APPEARANCEUR CLEAR 04/19/2017 1712   LABSPEC 1.006 04/19/2017 1712   PHURINE 6.0 04/19/2017 1712   GLUCOSEU NEGATIVE 04/19/2017 1712   HGBUR NEGATIVE 04/19/2017 1712   BILIRUBINUR Negative 11/04/2020 [place] 04/19/2017 1712   PROTEINUR Negative 11/04/2020 1720   PROTEINUR NEGATIVE 04/19/2017 1712   UROBILINOGEN 0.2 11/04/2020 1720   UROBILINOGEN 0.2 04/14/2014 0119   NITRITE Negative 11/04/2020 1720   NITRITE NEGATIVE 04/19/2017 1712   LEUKOCYTESUR Negative 11/04/2020 1720   Sepsis Labs: @LABRCNTIP (procalcitonin:4,lacticidven:4) ) Recent Results (from the past 240 hour(s))  Novel Coronavirus, NAA ([facility])     Status: None   Collection Time: 01/19/21  4:37 PM   Specimen: Nasopharyngeal(NP) swabs in vial transport medium  Result Value Ref Range Status   SARS-CoV-2, NAA Not Detected Not Detected Final    Comment: This nucleic acid amplification test was developed and its performance characteristics determined by [employer]. Nucleic acid amplification tests include RT-PCR and TMA. This test has not been FDA cleared or approved. This test has been authorized by FDA under an Emergency Use Authorization (EUA). This test is only authorized for the duration of time the declaration that circumstances exist justifying the authorization of the emergency use of in  vitro diagnostic tests for detection of SARS-CoV-2 virus and/or diagnosis of COVID-19 infection under section 564(b)(1) of the Act, 21 U.S.C. 096EAV-4(U) (1), unless the authorization is terminated or revoked sooner. When diagnostic testing is negative, the possibility of a false negative result should be considered in the context  of a patient's recent exposures and the presence of clinical signs and symptoms consistent with COVID-19. An individual without symptoms of COVID-19 and who is not shedding SARS-CoV-2 virus wo uld expect to have a negative (not detected) result in this assay.   SARS-COV-2, NAA 2 DAY TAT     Status: None   Collection Time: 01/19/21  4:37 PM  Result Value Ref Range Status   SARS-CoV-2, NAA 2 DAY TAT Performed  Final  Resp Panel by RT-PCR (Flu A&B, Covid) Nasopharyngeal Swab     Status: None   Collection Time: 01/22/21  5:51 PM   Specimen: Nasopharyngeal Swab; Nasopharyngeal(NP) swabs in vial transport medium  Result Value Ref Range Status   SARS Coronavirus 2 by RT PCR NEGATIVE NEGATIVE Final    Comment: (NOTE) SARS-CoV-2 target nucleic acids are NOT DETECTED.  The SARS-CoV-2 RNA is generally detectable in upper respiratory specimens during the acute phase of infection. The lowest concentration of SARS-CoV-2 viral copies this assay can detect is 138 copies/mL. A negative result does not preclude SARS-Cov-2 infection and should not be used as the sole basis for treatment or other patient management decisions. A negative result may occur with  improper specimen collection/handling, submission of specimen other than nasopharyngeal swab, presence of viral mutation(s) within the areas targeted by this assay, and inadequate number of viral copies(<138 copies/mL). A negative result must be combined with clinical observations, patient history, and epidemiological information. The expected result is Negative.  Fact Sheet for Patients:   EntrepreneurPulse.com.au  Fact Sheet for Healthcare Providers:  IncredibleEmployment.be  This test is no t yet approved or cleared by the [place] FDA and  has been authorized for detection and/or diagnosis of SARS-CoV-2 by FDA under an Emergency Use Authorization (EUA). This EUA will remain  in effect (meaning this test can be used) for the duration of the COVID-19 declaration under Section 564(b)(1) of the Act, 21 U.S.C.section 360bbb-3(b)(1), unless the authorization is terminated  or revoked sooner.       Influenza A by PCR NEGATIVE NEGATIVE Final   Influenza B by PCR NEGATIVE NEGATIVE Final    Comment: (NOTE) The Xpert Xpress SARS-CoV-2/FLU/RSV plus assay is intended as an aid in the diagnosis of influenza from Nasopharyngeal swab specimens and should not be used as a sole basis for treatment. Nasal washings and aspirates are unacceptable for Xpert Xpress SARS-CoV-2/FLU/RSV testing.  Fact Sheet for Patients: EntrepreneurPulse.com.au  Fact Sheet for Healthcare Providers: IncredibleEmployment.be  This test is not yet approved or cleared by the [place] FDA and has been authorized for detection and/or diagnosis of SARS-CoV-2 by FDA under an Emergency Use Authorization (EUA). This EUA will remain in effect (meaning this test can be used) for the duration of the COVID-19 declaration under Section 564(b)(1) of the Act, 21 U.S.C. section 360bbb-3(b)(1), unless the authorization is terminated or revoked.  Performed at [facility], [place] [address] [place], [place] [address]      Radiological Exams on Admission: DG Chest Portable 1 View  Result Date: 01/22/2021 CLINICAL DATA:  Chills and hypotension EXAM: PORTABLE CHEST 1 VIEW COMPARISON:  05/29/2017 FINDINGS: Post sternotomy changes. No focal consolidation or effusion. Normal cardiac size. No pneumothorax. Hazy atelectasis right base.  IMPRESSION: Hazy atelectasis right base. Electronically Signed   By: Donavan Foil M.D.   On: 01/22/2021 17:13    EKG: Independently reviewed. Sinus rhythm.   Assessment/Plan   1. Acute kidney injury superimposed on CKD IIIa  - SCr is 6.02  in ED, up from 1.93 on 01/19/21 and 1.33 on 3/24  - Secondary to N/V/D with hypovolemia and hypotension, concomitant ARB  - Hold antihypertensives, continue IVF hydration, renally-dose medications, check UA and urine chemistries, follow serial chem panels    2. Hyponatremia  - Serum sodium 118 in setting of hypovolemia  - She was given 1.5 liters NS in ED  - Continue NS infusion, repeat chem panel now and q4h initially and adjust IVF accordingly    3. Seizures  - Continue [name]    4. Chronic diastolic CHF  - Hypovolemic in setting of N/V/D  - Monitor weight and I/Os, hold beta blocker and ARB as above   5. CAD  - No anginal complaints    6. Essential thrombocythemia  - Stable, continue anagrelide   7. Hypokalemia  - Serum potassium 3.2 in ED  - Repeat chem panel pending, replace judiciously in light of renal failure    8. N/V/D  - Abdominal exam benign, no fever or leukocytosis on admission  - Most likely acute viral GE  - Continue IVF hydration, monitor electrolytes, advance diet as tolerated   9. Hypotension  - BP 72/55 at urgent care  - Due to hypovolemia and continued antihypertensive use  - BP normalized with IVF, continue to hold antihypertensives for now to optimize renal perfusion    DVT prophylaxis: sq heparin  Code Status: Full  Level of Care: Level of care: Telemetry Medical Family Communication: Husband updated at bedside Disposition Plan:  Patient is from: Home  Anticipated d/c is to: TBD Anticipated d/c date is: ~01/26/21 Patient currently: pending improvement in renal function and sodium, ability to tolerate adequate oral intake, may need therapy assessment  Consults called: None  Admission status: Inpatient      Vianne Bulls, MD [facility]  01/22/2021, 9:40 PM

## 2021-01-22 NOTE — ED Notes (Signed)
Patient is being discharged from the Urgent Care and sent to the Emergency Department via ems . Per Lina Sar PA , patient is in need of higher level of care due to hypotensive, weakness . Patient is aware and verbalizes understanding of plan of care.  Vitals:   01/22/21 1422  BP: (!) 72/55  Pulse: (!) 101  Resp: 20  SpO2: 90%

## 2021-01-22 NOTE — ED Notes (Signed)
Critical lab of sodium at 118 resulted

## 2021-01-22 NOTE — ED Notes (Signed)
Family updated as to patient's status.

## 2021-01-22 NOTE — ED Notes (Signed)
20 G iv placed in right [facility]

## 2021-01-22 NOTE — ED Triage Notes (Signed)
Pt present vomiting and diarrhea. Symptoms started last weekend. Pt states she being fatigue for the last couple of days.

## 2021-01-22 NOTE — ED Provider Notes (Signed)
[facility] EMERGENCY DEPARTMENT Provider Note   CSN: [id number] Arrival date & time: 01/22/21  1538     History Chief Complaint  Patient presents with  . Hypotension    Since monday N/v and diarrhea. Hypotensive, 80/40 and HR AT 100. Dizziness with movement.      Sara [name] is a 65 y.o. female.  The history is provided by the patient, the EMS personnel and medical records. No language interpreter was used.  Diarrhea Quality:  Watery Severity:  Severe Onset quality:  Gradual Duration:  6 days Timing:  Intermittent Progression:  Unchanged Relieved by:  Nothing Worsened by:  Nothing Ineffective treatments:  None tried Associated symptoms: vomiting   Associated symptoms: no abdominal pain, no chills, no recent cough, no diaphoresis, no fever and no headaches   Risk factors: no sick contacts        Past Medical History:  Diagnosis Date  . Diabetes mellitus   . Gout   . Hyperlipemia   . Hypertension   . LVH (left ventricular hypertrophy)    12/21/09 Echo -mild asymmetric LVH,EF =>55%  . Morbid obesity (HCC)   . OSA (obstructive sleep apnea)   . PAD (peripheral artery disease) (HCC)   . Renal artery stenosis (HCC) 12/13/2010   PTCA and Stent - right  . Seizures (HCC)   . Syncopal episodes   . Thrombocytosis     Patient Active Problem List   Diagnosis Date Noted  . SVT (supraventricular tachycardia) (HCC) 11/04/2020  . Nephropathy 07/28/2018  . Chronic kidney disease, stage 2 (mild) 07/28/2018  . History of renal artery stenosis 05/16/2018  . CAD s/p CABG  05/16/2018  . S/P CABG x 2 04/21/2017  . Elevated troponin   . NSTEMI (non-ST elevated myocardial infarction) (HCC)   . Chest pain 04/15/2017  . ACS (acute coronary syndrome) (HCC) 04/15/2017  . Seizures (HCC) 01/19/2016  . Typical absence seizures 03/28/2014  . Sleep apnea with use of continuous positive airway pressure (CPAP) 01/30/2014  . Hypercholesterolemia 01/04/2014  . OSA  (obstructive sleep apnea) 10/28/2013  . Syncope 07/21/2013  . Type 2 diabetes mellitus (HCC) 07/20/2013  . Acute renal failure (HCC) 07/20/2013  . History of renal stent 07/20/2013  . Orthostatic hypotension 07/20/2013  . Diastolic CHF (HCC) 07/20/2013  . Essential thrombocythemia (HCC) 04/19/2012  . Hypertension 04/19/2012  . Renal artery stenosis (right) s/p stent 2012 12/13/2010    Past Surgical History:  Procedure Laterality Date  . BREAST CYST EXCISION     S/P Benign Right Breast Cyst Removal.  . BREAST REDUCTION SURGERY  1995   S/P Bilateral breast reduction  . CESAREAN SECTION  1980, 1986   Times  Two.  . CORONARY ARTERY BYPASS GRAFT N/A 04/21/2017   Procedure: CORONARY ARTERY BYPASS GRAFTING (CABG) x 2, USING LEFT MAMMARY ARTERY AND RIGHT GREATER SAPHENOUS VEIN HARVESTED ENDOSCOPICALLY;  Surgeon: Alleen Borne, MD;  Location: [facility] OR;  Service: Open Heart Surgery;  Laterality: N/A;  . EXTREMITY CYST EXCISION  1993   left wrist  . LEFT HEART CATH AND CORONARY ANGIOGRAPHY N/A 04/17/2017   Procedure: Left Heart Cath and Coronary Angiography;  Surgeon: Corky Crafts, MD;  Location: [facility] INVASIVE CV LAB;  Service: Cardiovascular;  Laterality: N/A;  . PARTIAL HYSTERECTOMY  1994  . renal artery stent placement Right   . TEE WITHOUT CARDIOVERSION N/A 04/21/2017   Procedure: TRANSESOPHAGEAL ECHOCARDIOGRAM (TEE);  Surgeon: Alleen Borne, MD;  Location: [facility] OR;  Service: Open Heart  Surgery;  Laterality: N/A;     OB History   No obstetric history on file.     Family History  Problem Relation Age of Onset  . Heart failure Mother   . Diabetes Father   . Diabetes Sister   . Hypertension Sister   . Hypertension Sister   . Hypertension Sister   . Breast cancer Maternal Aunt        2 maternal aunts had breast cancer.  . Breast cancer Maternal Grandmother     Social History   Tobacco Use  . Smoking status: Never Smoker  . Smokeless tobacco: Never Used  Vaping Use  .  Vaping Use: Never used  Substance Use Topics  . Alcohol use: No    Alcohol/week: 0.0 standard drinks  . Drug use: No    Home Medications Prior to Admission medications   Medication Sig Start Date End Date Taking? Authorizing Provider  anagrelide ([name]) 0.5 MG capsule Take 1 capsule (0.5 mg total) by mouth at bedtime. On Mondays, Wednesdays and Fridays 09/04/20   Truitt Merle, MD  anagrelide ([name]) 1 MG capsule Take 1 capsule (1 mg total) by mouth daily. 09/11/20   Truitt Merle, MD  aspirin EC 81 MG tablet Take 1 tablet (81 mg total) by mouth daily. Swallow whole. Patient taking differently: Take 81 mg by mouth daily. 2 tabs at bedtime 05/18/20 05/18/21  [name], MD  atorvastatin (LIPITOR) 40 MG tablet TAKE 1 TABLET BY MOUTH DAILY AT 6 PM 09/29/20   Croitoru, Mihai, MD  B Complex Vitamins (B COMPLEX PO) Take 1 tablet by mouth daily.     [provider]  Blood Glucose Monitoring Suppl (ONETOUCH VERIO IQ SYSTEM) w/Device KIT Use as directed to check blood sugars 1 time per day dx: e11.22 05/22/19   [name], MD  chlorthalidone (HYGROTON) 25 MG tablet Take 12.5 mg by mouth daily. 11/06/20   [provider]  Cholecalciferol (VITAMIN D3) 2000 UNITS capsule Take 2,000 Units by mouth daily.     [provider]  [name] 0.5 MG/0.1ML SOAJ Inject 1 each into the skin as needed. 01/19/21   [name], FNP  glucose blood (ONETOUCH VERIO) test strip Use as directed to check blood sugars 2 times per day dx: e11.65 01/19/21   [name], MD  Lacosamide ([name]) 100 MG TABS Take one tablet in AM LOT [id number], 2/26. One pack of 14 tabs 11/16/20   Ward Givens, NP  Lacosamide ([name]) 150 MG TABS Take one tablet in PM 11/16/20   Ward Givens, NP  metoprolol tartrate (LOPRESSOR) 50 MG tablet Take 1.5 tablets (75 mg total) by mouth 2 (two) times daily. 12/21/20   Croitoru, Mihai, MD  Multiple Vitamin (MULTIVITAMIN) capsule Take 1 capsule by mouth  daily.    [provider]  ondansetron (ZOFRAN) 4 MG tablet Take 1 tablet (4 mg total) by mouth daily as needed for nausea or vomiting. 01/19/21 01/19/22  [name], FNP  OneTouch Delica Lancets 40J MISC USE AS DIRECTED TO CHECK BLOOD SUGAR LEVELS ONCE DAILY 05/25/20   [name], MD  Specialty Vitamins Products (MAGNESIUM, AMINO ACID CHELATE,) 133 MG tablet Take 1 tablet by mouth 2 (two) times daily.    [provider]  telmisartan (MICARDIS) 80 MG tablet TAKE 1 TABLET BY MOUTH EVERY DAY 12/28/20   [name], MD  [name] 100 MG TABS Take 1 tablet in the morning, take 1.5 tablets in the evening 11/13/20   Margette Fast  K, MD    Allergies    Sulfa antibiotics  Review of Systems   Review of Systems  Constitutional: Positive for fatigue. Negative for chills, diaphoresis and fever.  HENT: Negative for congestion.   Eyes: Negative for visual disturbance.  Respiratory: Negative for cough, chest tightness, shortness of breath and wheezing.   Cardiovascular: Negative for chest pain.  Gastrointestinal: Positive for diarrhea, nausea and vomiting. Negative for abdominal pain and constipation.  Genitourinary: Negative for dysuria, enuresis and flank pain.  Musculoskeletal: Negative for back pain, neck pain and neck stiffness.  Neurological: Positive for light-headedness. Negative for dizziness, tremors, seizures, syncope, numbness and headaches.  Psychiatric/Behavioral: Negative for agitation and confusion.  All other systems reviewed and are negative.   Physical Exam Updated Vital Signs BP (!) 113/52   Pulse 88   Temp 98.1 F (36.7 C) (Oral)   Resp (!) 23   Ht $R'5\' 2"'kx$  (1.575 m)   Wt 80.3 kg   SpO2 96%   BMI 32.37 kg/m   Physical Exam Vitals and nursing note reviewed.  Constitutional:      General: She is not in acute distress.    Appearance: She is well-developed. She is ill-appearing. She is not toxic-appearing or diaphoretic.  HENT:     Head: Normocephalic  and atraumatic.     Nose: No congestion.     Mouth/Throat:     Mouth: Mucous membranes are dry.     Pharynx: No oropharyngeal exudate or posterior oropharyngeal erythema.  Eyes:     Extraocular Movements: Extraocular movements intact.     Conjunctiva/sclera: Conjunctivae normal.     Pupils: Pupils are equal, round, and reactive to light.  Cardiovascular:     Rate and Rhythm: Normal rate and regular rhythm.     Pulses: Normal pulses.     Heart sounds: No murmur heard.   Pulmonary:     Effort: Pulmonary effort is normal. No respiratory distress.     Breath sounds: Normal breath sounds. No wheezing, rhonchi or rales.  Chest:     Chest wall: No tenderness.  Abdominal:     General: Abdomen is flat.     Palpations: Abdomen is soft.     Tenderness: There is no abdominal tenderness. There is no right CVA tenderness, left CVA tenderness, guarding or rebound.  Musculoskeletal:        General: No tenderness.     Cervical back: Neck supple. No tenderness.     Right lower leg: No edema.     Left lower leg: No edema.  Skin:    General: Skin is warm and dry.     Capillary Refill: Capillary refill takes less than 2 seconds.     Findings: No erythema.  Neurological:     General: No focal deficit present.     Mental Status: She is alert.     Sensory: No sensory deficit.     Motor: No weakness.  Psychiatric:        Mood and Affect: Mood normal.     ED Results / Procedures / Treatments   Labs (all labs ordered are listed, but only abnormal results are displayed) Labs Reviewed  CBC WITH DIFFERENTIAL/PLATELET - Abnormal; Notable for the following components:      Result Value   Lymphs Abs 0.6 (*)    Monocytes Absolute 1.4 (*)    All other components within normal limits  COMPREHENSIVE METABOLIC PANEL - Abnormal; Notable for the following components:   Sodium 118 (*)  Potassium 3.2 (*)    Chloride 74 (*)    BUN 83 (*)    Creatinine, Ser 6.02 (*)    Calcium 8.2 (*)    Total  Protein 6.3 (*)    Albumin 2.8 (*)    GFR, Estimated 7 (*)    Anion gap 19 (*)    All other components within normal limits  BRAIN NATRIURETIC PEPTIDE - Abnormal; Notable for the following components:   B Natriuretic Peptide 376.8 (*)    All other components within normal limits  RESP PANEL BY RT-PCR (FLU A&B, COVID) ARPGX2  URINE CULTURE  CULTURE, BLOOD (ROUTINE X 2)  CULTURE, BLOOD (ROUTINE X 2)  LIPASE, BLOOD  TSH  LACTIC ACID, PLASMA  LACTIC ACID, PLASMA  URINALYSIS, ROUTINE W REFLEX MICROSCOPIC    EKG EKG Interpretation  Date/Time:  Friday January 22 2021 15:50:16 EDT Ventricular Rate:  80 PR Interval:  193 QRS Duration: 82 QT Interval:  367 QTC Calculation: 424 R Axis:   33 Text Interpretation: Sinus rhythm Consider left atrial enlargement Anteroseptal infarct, old Abnormal T, consider ischemia, lateral leads When compared to prior, similar appearance. NO STEMI Confirmed by Antony Blackbird [phone]) on 01/22/2021 3:52:53 PM   Radiology DG Chest Portable 1 View  Result Date: 01/22/2021 CLINICAL DATA:  Chills and hypotension EXAM: PORTABLE CHEST 1 VIEW COMPARISON:  05/29/2017 FINDINGS: Post sternotomy changes. No focal consolidation or effusion. Normal cardiac size. No pneumothorax. Hazy atelectasis right base. IMPRESSION: Hazy atelectasis right base. Electronically Signed   By: Donavan Foil M.D.   On: 01/22/2021 17:13    Procedures Procedures     Medications Ordered in ED Medications  sodium chloride 0.9 % bolus 500 mL (has no administration in time range)  sodium chloride 0.9 % bolus 1,000 mL (0 mLs Intravenous Stopped 01/22/21 1734)    ED Course  I have reviewed the triage vital signs and the nursing notes.  Pertinent labs & imaging results that were available during my care of the patient were reviewed by me and considered in my medical decision making (see chart for details).    MDM Rules/Calculators/A&P                          YAN OKRAY is a 65 y.o.  female with a past medical history significant for CAD with CABG, obesity, renal artery stenosis, diabetes, hyperlipidemia, seizures, and CHF who presents from urgent care with concern for 6 days of nausea, vomiting, diarrhea, lightheadedness, fatigue, and sleepiness.  According to urgent care, patient was found to be hypotensive and intermittently hypoxic.  Patient was denying any chest pain, shortness of breath, or cough.  She denies any fevers or significant chills.  She denies any urinary changes or constipation but does report diarrhea.  She does not report any blood in the emesis or stool.  She denies any sick contacts.  She says that she feels very lightheaded and dehydrated and thinks that is causing her to be sleepy.  She denies any trauma.  She denies any headache or neck pain or neck stiffness.  She denies any abdominal pain or chest pain with it.  On arrival, patient's blood pressure is 81 systolic.  She feels very fatigued.  She reports her nausea has improved at this time and has already received 500 fluids with EMS.  Her legs have minimal edema and her lungs did not sound fluid-filled.  There were no rales or wheezing or rhonchi initially.  Chest and abdomen are nontender.  Normal bowel sounds.  No focal neurologic deficits.  No nuchal rigidity or neck tenderness.  Patient resting in just feels sleepy.  Clinical estimate patient dehydrated from the nausea, vomiting, diarrhea for the last 6 days.  I suspect her symptoms may be related to a viral gastritis that has been going on his committee significantly over the last few weeks.  We will give her more fluids and get screening labs.  A rectal temp was obtained and she was not febrile.  This time, given her improving blood pressure and her lack of fever, will hold on making her a code sepsis.  We will give her some fluids and reassess.  Urinalysis and chest x-ray.  We will get a Covid test as well.  Anticipate reassessment will determine  disposition. . 8:11 PM Work-up began to return unfortunately patient has significant kidney injury with a creatinine of 6.02 up from 1.9 previously.  Patient is also hyponatremic now at 118.  Her BUN is elevated at 83 which may contribute to her fatigue and somewhat somnolence.  I spoke to nephrology who reviewed the case.  They suspect that in the setting of fluid loss with nausea vomiting, diarrhea, her telmisartan may have contributed to her kidney injury.  They request she stop this medication and rehydrate gently and monitor her electrolytes.  At this time I do not suspect bacterial infection with her normal white count, lack of fever, and her x-ray only showed some mild atelectasis.  No clinical concern for pneumonia this time.  Covid and flu test negative.  Medicine will admit for further management of acute kidney injury and electrolyte imbalance.   Final Clinical Impression(s) / ED Diagnoses Final diagnoses:  AKI (acute kidney injury) ([place])  Dehydration  Nausea vomiting and diarrhea     Clinical Impression: 1. AKI (acute kidney injury) ([place])   2. Dehydration   3. Nausea vomiting and diarrhea     Disposition: Admit  This note was prepared with assistance of Dragon voice recognition software. Occasional wrong-word or sound-a-like substitutions may have occurred due to the inherent limitations of voice recognition software.     Cathyrn Deas, Gwenyth Allegra, MD 01/22/21 2017

## 2021-01-23 ENCOUNTER — Inpatient Hospital Stay (HOSPITAL_COMMUNITY): Payer: Medicare Other

## 2021-01-23 DIAGNOSIS — E162 Hypoglycemia, unspecified: Secondary | ICD-10-CM

## 2021-01-23 DIAGNOSIS — K56609 Unspecified intestinal obstruction, unspecified as to partial versus complete obstruction: Secondary | ICD-10-CM

## 2021-01-23 LAB — BASIC METABOLIC PANEL
Anion gap: 20 — ABNORMAL HIGH (ref 5–15)
Anion gap: 22 — ABNORMAL HIGH (ref 5–15)
Anion gap: 18 — ABNORMAL HIGH (ref 5–15)
Anion gap: 20 — ABNORMAL HIGH (ref 5–15)
Anion gap: 17 — ABNORMAL HIGH (ref 5–15)
Anion gap: 18 — ABNORMAL HIGH (ref 5–15)
BUN: 87 mg/dL — ABNORMAL HIGH (ref 8–23)
BUN: 92 mg/dL — ABNORMAL HIGH (ref 8–23)
BUN: 87 mg/dL — ABNORMAL HIGH (ref 8–23)
BUN: 89 mg/dL — ABNORMAL HIGH (ref 8–23)
BUN: 87 mg/dL — ABNORMAL HIGH (ref 8–23)
BUN: 86 mg/dL — ABNORMAL HIGH (ref 8–23)
CO2: 19 mmol/L — ABNORMAL LOW (ref 22–32)
CO2: 13 mmol/L — ABNORMAL LOW (ref 22–32)
CO2: 17 mmol/L — ABNORMAL LOW (ref 22–32)
CO2: 16 mmol/L — ABNORMAL LOW (ref 22–32)
CO2: 19 mmol/L — ABNORMAL LOW (ref 22–32)
CO2: 18 mmol/L — ABNORMAL LOW (ref 22–32)
Calcium: 8 mg/dL — ABNORMAL LOW (ref 8.9–10.3)
Calcium: 7.9 mg/dL — ABNORMAL LOW (ref 8.9–10.3)
Calcium: 8 mg/dL — ABNORMAL LOW (ref 8.9–10.3)
Calcium: 7.9 mg/dL — ABNORMAL LOW (ref 8.9–10.3)
Calcium: 8.2 mg/dL — ABNORMAL LOW (ref 8.9–10.3)
Calcium: 8.3 mg/dL — ABNORMAL LOW (ref 8.9–10.3)
Chloride: 81 mmol/L — ABNORMAL LOW (ref 98–111)
Chloride: 84 mmol/L — ABNORMAL LOW (ref 98–111)
Chloride: 89 mmol/L — ABNORMAL LOW (ref 98–111)
Chloride: 88 mmol/L — ABNORMAL LOW (ref 98–111)
Chloride: 88 mmol/L — ABNORMAL LOW (ref 98–111)
Chloride: 89 mmol/L — ABNORMAL LOW (ref 98–111)
Creatinine, Ser: 5.69 mg/dL — ABNORMAL HIGH (ref 0.44–1.00)
Creatinine, Ser: 5.56 mg/dL — ABNORMAL HIGH (ref 0.44–1.00)
Creatinine, Ser: 5.13 mg/dL — ABNORMAL HIGH (ref 0.44–1.00)
Creatinine, Ser: 5.06 mg/dL — ABNORMAL HIGH (ref 0.44–1.00)
Creatinine, Ser: 4.89 mg/dL — ABNORMAL HIGH (ref 0.44–1.00)
Creatinine, Ser: 4.74 mg/dL — ABNORMAL HIGH (ref 0.44–1.00)
GFR, Estimated: 8 mL/min — ABNORMAL LOW (ref >60)
GFR, Estimated: 8 mL/min — ABNORMAL LOW (ref >60)
GFR, Estimated: 9 mL/min — ABNORMAL LOW (ref >60)
GFR, Estimated: 9 mL/min — ABNORMAL LOW (ref >60)
GFR, Estimated: 9 mL/min — ABNORMAL LOW (ref >60)
GFR, Estimated: 10 mL/min — ABNORMAL LOW (ref >60)
Glucose, Bld: 63 mg/dL — ABNORMAL LOW (ref 70–99)
Glucose, Bld: 58 mg/dL — ABNORMAL LOW (ref 70–99)
Glucose, Bld: 58 mg/dL — ABNORMAL LOW (ref 70–99)
Glucose, Bld: 56 mg/dL — ABNORMAL LOW (ref 70–99)
Glucose, Bld: 60 mg/dL — ABNORMAL LOW (ref 70–99)
Glucose, Bld: 89 mg/dL (ref 70–99)
Potassium: 2.8 mmol/L — ABNORMAL LOW (ref 3.5–5.1)
Potassium: 3.4 mmol/L — ABNORMAL LOW (ref 3.5–5.1)
Potassium: 2.6 mmol/L — CL (ref 3.5–5.1)
Potassium: 2.6 mmol/L — CL (ref 3.5–5.1)
Potassium: 2.5 mmol/L — CL (ref 3.5–5.1)
Potassium: 3 mmol/L — ABNORMAL LOW (ref 3.5–5.1)
Sodium: 120 mmol/L — ABNORMAL LOW (ref 135–145)
Sodium: 119 mmol/L — CL (ref 135–145)
Sodium: 124 mmol/L — ABNORMAL LOW (ref 135–145)
Sodium: 124 mmol/L — ABNORMAL LOW (ref 135–145)
Sodium: 124 mmol/L — ABNORMAL LOW (ref 135–145)
Sodium: 125 mmol/L — ABNORMAL LOW (ref 135–145)

## 2021-01-23 LAB — C DIFFICILE QUICK SCREEN W PCR REFLEX
C Diff antigen: NEGATIVE
C Diff interpretation: NOT DETECTED
C Diff toxin: NEGATIVE

## 2021-01-23 LAB — URINALYSIS, COMPLETE (UACMP) WITH MICROSCOPIC
Bilirubin Urine: NEGATIVE
Glucose, UA: NEGATIVE mg/dL
Hgb urine dipstick: NEGATIVE
Ketones, ur: NEGATIVE mg/dL
Leukocytes,Ua: NEGATIVE
Nitrite: NEGATIVE
Protein, ur: NEGATIVE mg/dL
Specific Gravity, Urine: 1.01 (ref 1.005–1.030)
pH: 5 (ref 5.0–8.0)

## 2021-01-23 LAB — CBC
HCT: 32.9 % — ABNORMAL LOW (ref 36.0–46.0)
Hemoglobin: 12.1 g/dL (ref 12.0–15.0)
MCH: 30.1 pg (ref 26.0–34.0)
MCHC: 36.8 g/dL — ABNORMAL HIGH (ref 30.0–36.0)
MCV: 81.8 fL (ref 80.0–100.0)
Platelets: 284 10*3/uL (ref 150–400)
RBC: 4.02 MIL/uL (ref 3.87–5.11)
RDW: 13.3 % (ref 11.5–15.5)
WBC: 5.9 10*3/uL (ref 4.0–10.5)
nRBC: 0 % (ref 0.0–0.2)

## 2021-01-23 LAB — GASTROINTESTINAL PANEL BY PCR, STOOL (REPLACES STOOL CULTURE)

## 2021-01-23 LAB — GLUCOSE, CAPILLARY
Glucose-Capillary: 59 mg/dL — ABNORMAL LOW (ref 70–99)
Glucose-Capillary: 79 mg/dL (ref 70–99)

## 2021-01-23 LAB — SODIUM, URINE, RANDOM: Sodium, Ur: 11 mmol/L

## 2021-01-23 LAB — MAGNESIUM: Magnesium: 2.5 mg/dL — ABNORMAL HIGH (ref 1.7–2.4)

## 2021-01-23 LAB — CREATININE, URINE, RANDOM: Creatinine, Urine: 105.79 mg/dL

## 2021-01-23 LAB — HIV ANTIBODY (ROUTINE TESTING W REFLEX): HIV Screen 4th Generation wRfx: NONREACTIVE

## 2021-01-23 MED ORDER — POTASSIUM CHLORIDE CRYS ER 20 MEQ PO TBCR
40.0000 meq | EXTENDED_RELEASE_TABLET | ORAL | Status: AC
  Administered 2021-01-23 (×2): 40 meq via ORAL
  Filled 2021-01-23 (×2): qty 2

## 2021-01-23 MED ORDER — SODIUM CHLORIDE 0.9 % IV SOLN: INTRAVENOUS | Status: DC

## 2021-01-23 MED ORDER — KCL IN DEXTROSE-NACL 20-5-0.9 MEQ/L-%-% IV SOLN
INTRAVENOUS | Status: DC
  Filled 2021-01-23 (×8): qty 1000

## 2021-01-23 MED ORDER — POTASSIUM CHLORIDE 10 MEQ/100ML IV SOLN
10.0000 meq | INTRAVENOUS | Status: AC
  Administered 2021-01-23 – 2021-01-24 (×5): 10 meq via INTRAVENOUS
  Filled 2021-01-23 (×5): qty 100

## 2021-01-23 MED ORDER — SODIUM BICARBONATE 650 MG PO TABS
650.0000 mg | ORAL_TABLET | Freq: Three times a day (TID) | ORAL | Status: DC
  Administered 2021-01-23 (×2): 650 mg via ORAL
  Filled 2021-01-23 (×2): qty 1

## 2021-01-23 MED ORDER — SODIUM CHLORIDE 0.9 % IV BOLUS
1000.0000 mL | Freq: Once | INTRAVENOUS | Status: AC
  Administered 2021-01-23: 1000 mL via INTRAVENOUS

## 2021-01-23 MED ORDER — PROCHLORPERAZINE EDISYLATE 10 MG/2ML IJ SOLN
10.0000 mg | Freq: Once | INTRAMUSCULAR | Status: AC
  Administered 2021-01-23: 10 mg via INTRAVENOUS
  Filled 2021-01-23: qty 2

## 2021-01-23 MED ORDER — IOHEXOL 9 MG/ML PO SOLN
ORAL | Status: AC
  Administered 2021-01-23: 500 mL
  Filled 2021-01-23: qty 1000

## 2021-01-23 MED ORDER — SODIUM CHLORIDE 0.9% FLUSH: 10.0000 mL | INTRAVENOUS | Status: DC | PRN

## 2021-01-23 NOTE — Progress Notes (Signed)
PROGRESS NOTE    Sara [name]  MWU:[id number] DOB: [birth year] DOA: 01/22/2021 PCP: [name], MD    Chief Complaint  Patient presents with  . Hypotension    Since Monday N/V and diarrhea. Hypotensive, 80/40 and HR at 100 at urgent care. Dizziness with movement.      Brief Narrative:  Sara [name] is a 65 y.o. female with medical history significant for CAD, mild ischemic cardiomyopathy, OSA on CPAP, seizures, chronic kidney disease stage IIIa, hypertension, and essential thrombocythemia, now presenting to the emergency department for evaluation of nausea, vomiting, and diarrhea.  Patient reports that she developed severe nausea and nonbloody vomiting about 1 week ago.   Over the past couple days, she has had progressive lethargy, seem to have some mild confusion, and this prompted family to bring her to urgent care today.  She was noted to have blood pressure of 72/55 at urgent care and sent to the ED.  Subjective:  Reports feeling weak, last bm was before day shift , currently denies ab pain Reports vomited all day yesterday, no vomiting today, currently no nausea No fever Family at bedside  Assessment & Plan:   Principal Problem:   Acute renal failure superimposed on stage 3a chronic kidney disease ([place]) Active Problems:   Essential thrombocythemia (HCC)   Hypotension due to hypovolemia   Chronic diastolic CHF (congestive heart failure) (HCC)   Sleep apnea with use of continuous positive airway pressure (CPAP)   Seizures (HCC)   CAD s/p CABG    Hyponatremia   Ischemic cardiomyopathy   Hypokalemia  N/V/D/SBO - currently denies abdominal pain, does has ventral hernia on exam -C. difficile testing negative, GI PCR panel in process - KUB showed SBO -keep npo, continue IV hydration, antiemetics -since no pain , no vomiting will hold off ng insertion,  Paged general surgery    AKI on CKD 3 a -Creatinine significant elevated at 6.02 , bun 83 n ER, cr on 3/29  was 1.93, cr on 3/24 was 1.33 -Likely prerenal from dehydration and hypotension with continued use of ARB at home -Renal ultrasound no hydronephrosis -Urine specific gravity 1.01, rare bacteria, no RBC, no WBC, negative nitrite, no protein -Urine culture in process -Place Foley to monitor urine output -Hold Micardis -Continue IV hydration, renal dosing meds  Hyponatremia -Sodium 118 in the setting of hypovolemia -Continue normal saline, monitor BMP every 4 hours, rate of correction donot exceed 0.78mmol/l/hr  Hypoglycemia -Currently n.p.o. due to small bowel obstruction -We will change IV fluids from normal saline to D5 normal saline with K  Hypokalemia, replace K, check mag  Chronic diastolic CHF -Present with dehydration -Continue hydration, monitor volume status  History of CAD status post CABG in 2018 Currently denies chest pain, she is n.p.o., hold statin, aspirin  Hypertension Present with hypotension Home blood pressure medication held initially, she received hydration, blood pressure normalized -We will schedule IV Lopressor with holding parameters due to n.p.o. status  History of essential thrombocythemia On anagrelide at home, currently npo Platelets 284  History of seizure On [name] at home ,currently npo Report has not had seizure for many years  OSA on CPAP Continue     Body mass index is 32.34 kg/m.Marland Kitchen      Unresulted Labs (From admission, onward)          Start     Ordered   01/24/21 0500  Magnesium  Tomorrow morning,   R        01/23/21 1656  01/23/21 1657  Magnesium  Add-on,   AD        01/23/21 1656   01/23/21 1062  Basic metabolic panel  Now then every 4 hours,   R (with TIMED occurrences)      01/23/21 1655   01/23/21 0500  CBC  Daily,   R      01/22/21 2133   01/22/21 2138  Gastrointestinal Panel by PCR , Stool  (Gastrointestinal Panel by PCR, Stool                                                                                                                                                      **Does Not include CLOSTRIDIUM DIFFICILE testing. **If CDIFF testing is needed, place order from the "C Difficile Testing" order set.**)  Once,   STAT        01/22/21 2137   01/22/21 1555  Urine culture  ONCE - STAT,   STAT        01/22/21 1555   01/22/21 1555  Blood culture (routine x 2)  BLOOD CULTURE X 2,   STAT      01/22/21 1555            DVT prophylaxis: heparin injection 5,000 Units Start: 01/22/21 2200   Code Status:full Family Communication: husband and niece at bedside  Disposition:   Status is: Inpatient  Dispo: The patient is from: home              Anticipated d/c is to: home              Anticipated d/c date is: >48hrs                Consultants:   General surgery  Procedures:   None  Antimicrobials:   Anti-infectives (From admission, onward)   None          Objective: Vitals:   01/22/21 2328 01/22/21 2333 01/23/21 0820 01/23/21 1503  BP: (!) 123/54  109/63 (!) 126/55  Pulse: 90  93 94  Resp: 18  17 16   Temp: 98 F (36.7 C)  97.9 F (36.6 C) 97.6 F (36.4 C)  TempSrc: Oral  Oral Oral  SpO2: 97%  97% 96%  Weight:  80.2 kg    Height:  5\' 2"  (1.575 m)      Intake/Output Summary (Last 24 hours) at 01/23/2021 1730 Last data filed at 01/23/2021 0957 Gross per 24 hour  Intake 1946.33 ml  Output 35 ml  Net 1911.33 ml   Filed Weights   01/22/21 1553 01/22/21 2333  Weight: 80.3 kg 80.2 kg    Examination:  General exam: appear weak, but calm, NAD Respiratory system: Clear to auscultation. Respiratory effort normal. Cardiovascular system: S1 & S2 heard, RRR. + 3/6 precordial murmur , No pedal edema. Prior CABG Gastrointestinal system:  ventral hernia , not reducible but nontender, + bowel sounds heard. Central nervous system: Alert and oriented. No focal neurological deficits. Extremities: generalized weakness Skin: No rashes, lesions or ulcers Psychiatry: Judgement and insight  appear normal. Mood & affect appropriate.     Data Reviewed: I have personally reviewed following labs and imaging studies  CBC: Recent Labs  Lab 01/19/21 1723 01/22/21 1745 01/23/21 0354  WBC 19.0* 8.1 5.9  NEUTROABS  --  5.8  --   HGB 14.8 13.6 12.1  HCT 41.9 39.2 32.9*  MCV 83 84.3 81.8  PLT 338 246 400    Basic Metabolic Panel: Recent Labs  Lab 01/22/21 1745 01/23/21 0033 01/23/21 0354 01/23/21 0955 01/23/21 1435  NA 118* 120* 119* 124* 124*  K 3.2* 2.8* 3.4* 2.6* 2.6*  CL 74* 81* 84* 89* 88*  CO2 25 19* 13* 17* 16*  GLUCOSE 81 63* 58* 58* 56*  BUN 83* 87* 92* 87* 89*  CREATININE 6.02* 5.69* 5.56* 5.13* 5.06*  CALCIUM 8.2* 8.0* 7.9* 8.0* 7.9*    GFR: Estimated Creatinine Clearance: 10.7 mL/min (A) (by C-G formula based on SCr of 5.06 mg/dL (H)).  Liver Function Tests: Recent Labs  Lab 01/22/21 1745  AST 25  ALT 22  ALKPHOS 67  BILITOT 1.0  PROT 6.3*  ALBUMIN 2.8*    CBG: Recent Labs  Lab 01/23/21 1717  GLUCAP 59*     Recent Results (from the past 240 hour(s))  Novel Coronavirus, NAA ([facility])     Status: None   Collection Time: 01/19/21  4:37 PM   Specimen: Nasopharyngeal(NP) swabs in vial transport medium  Result Value Ref Range Status   SARS-CoV-2, NAA Not Detected Not Detected Final    Comment: This nucleic acid amplification test was developed and its performance characteristics determined by [employer]. Nucleic acid amplification tests include RT-PCR and TMA. This test has not been FDA cleared or approved. This test has been authorized by FDA under an Emergency Use Authorization (EUA). This test is only authorized for the duration of time the declaration that circumstances exist justifying the authorization of the emergency use of in vitro diagnostic tests for detection of SARS-CoV-2 virus and/or diagnosis of COVID-19 infection under section 564(b)(1) of the Act, 21 U.S.C. 867YPP-5(K) (1), unless the authorization is  terminated or revoked sooner. When diagnostic testing is negative, the possibility of a false negative result should be considered in the context of a patient's recent exposures and the presence of clinical signs and symptoms consistent with COVID-19. An individual without symptoms of COVID-19 and who is not shedding SARS-CoV-2 virus wo uld expect to have a negative (not detected) result in this assay.   SARS-COV-2, NAA 2 DAY TAT     Status: None   Collection Time: 01/19/21  4:37 PM  Result Value Ref Range Status   SARS-CoV-2, NAA 2 DAY TAT Performed  Final  Blood culture (routine x 2)     Status: None (Preliminary result)   Collection Time: 01/22/21  5:45 PM   Specimen: BLOOD  Result Value Ref Range Status   Specimen Description BLOOD SITE NOT SPECIFIED  Final   Special Requests   Final    BOTTLES DRAWN AEROBIC AND ANAEROBIC Blood Culture results may not be optimal due to an inadequate volume of blood received in culture bottles   Culture   Final    NO GROWTH < 24 HOURS Performed at [facility], [place]. [address] [place], [place] [address]  Report Status PENDING  Incomplete  Resp Panel by RT-PCR (Flu A&B, Covid) Nasopharyngeal Swab     Status: None   Collection Time: 01/22/21  5:51 PM   Specimen: Nasopharyngeal Swab; Nasopharyngeal(NP) swabs in vial transport medium  Result Value Ref Range Status   SARS Coronavirus 2 by RT PCR NEGATIVE NEGATIVE Final    Comment: (NOTE) SARS-CoV-2 target nucleic acids are NOT DETECTED.  The SARS-CoV-2 RNA is generally detectable in upper respiratory specimens during the acute phase of infection. The lowest concentration of SARS-CoV-2 viral copies this assay can detect is 138 copies/mL. A negative result does not preclude SARS-Cov-2 infection and should not be used as the sole basis for treatment or other patient management decisions. A negative result may occur with  improper specimen collection/handling, submission of specimen  other than nasopharyngeal swab, presence of viral mutation(s) within the areas targeted by this assay, and inadequate number of viral copies(<138 copies/mL). A negative result must be combined with clinical observations, patient history, and epidemiological information. The expected result is Negative.  Fact Sheet for Patients:  EntrepreneurPulse.com.au  Fact Sheet for Healthcare Providers:  IncredibleEmployment.be  This test is no t yet approved or cleared by the [place] FDA and  has been authorized for detection and/or diagnosis of SARS-CoV-2 by FDA under an Emergency Use Authorization (EUA). This EUA will remain  in effect (meaning this test can be used) for the duration of the COVID-19 declaration under Section 564(b)(1) of the Act, 21 U.S.C.section 360bbb-3(b)(1), unless the authorization is terminated  or revoked sooner.       Influenza A by PCR NEGATIVE NEGATIVE Final   Influenza B by PCR NEGATIVE NEGATIVE Final    Comment: (NOTE) The Xpert Xpress SARS-CoV-2/FLU/RSV plus assay is intended as an aid in the diagnosis of influenza from Nasopharyngeal swab specimens and should not be used as a sole basis for treatment. Nasal washings and aspirates are unacceptable for Xpert Xpress SARS-CoV-2/FLU/RSV testing.  Fact Sheet for Patients: EntrepreneurPulse.com.au  Fact Sheet for Healthcare Providers: IncredibleEmployment.be  This test is not yet approved or cleared by the [place] FDA and has been authorized for detection and/or diagnosis of SARS-CoV-2 by FDA under an Emergency Use Authorization (EUA). This EUA will remain in effect (meaning this test can be used) for the duration of the COVID-19 declaration under Section 564(b)(1) of the Act, 21 U.S.C. section 360bbb-3(b)(1), unless the authorization is terminated or revoked.  Performed at [facility], [place] [address], [place],  [place] [address]   C Difficile Quick Screen w PCR reflex     Status: None   Collection Time: 01/22/21 11:15 PM   Specimen: STOOL  Result Value Ref Range Status   C Diff antigen NEGATIVE NEGATIVE Final   C Diff toxin NEGATIVE NEGATIVE Final   C Diff interpretation No C. difficile detected.  Final    Comment: Performed at [facility], [place] [address] [place], [place] [address]         Radiology Studies: DG Abd 1 View  Result Date: 01/23/2021 CLINICAL DATA:  Abdominal pain EXAM: ABDOMEN - 1 VIEW COMPARISON:  None. FINDINGS: Diffuse small bowel dilatation is identified with a paucity of colonic gas. No free air, portal venous gas, or pneumatosis. No other acute abnormalities. IMPRESSION: Small bowel obstruction. Insert PRA call report Electronically Signed   By: Dorise Bullion III M.D   On: 01/23/2021 16:55   US RENAL  Result Date: 01/23/2021 CLINICAL DATA:  Acute kidney injury  EXAM: RENAL / URINARY TRACT ULTRASOUND COMPLETE COMPARISON:  None. FINDINGS: Right Kidney: Renal measurements: 10.7 x 5.9 x 5.2 cm = volume: 172 mL. Increased renal echogenicity. Conspicuity is noted between the echogenic cortex and more hypoechoic renal pyramids. No evidence of hydronephrosis. Left Kidney: Renal measurements: 9.8 x 5.3 x 4.6 cm = volume: 123 mL. Increased renal echogenicity. Conspicuity is noted between the echogenic cortex and more hypoechoic renal pyramids. No evidence of hydronephrosis. Bladder: Foley catheter visualized in the decompressed bladder. Other: None. IMPRESSION: 1. No evidence of hydronephrosis. 2. Increased renal parenchymal echogenicity suggests underlying medical renal disease. 3. Foley catheter present within the collapsed bladder. Electronically Signed   By: Jacqulynn Cadet M.D.   On: 01/23/2021 11:37   DG Chest Portable 1 View  Result Date: 01/22/2021 CLINICAL DATA:  Chills and hypotension EXAM: PORTABLE CHEST 1 VIEW COMPARISON:  05/29/2017 FINDINGS: Post sternotomy changes. No  focal consolidation or effusion. Normal cardiac size. No pneumothorax. Hazy atelectasis right base. IMPRESSION: Hazy atelectasis right base. Electronically Signed   By: Donavan Foil M.D.   On: 01/22/2021 17:13        Scheduled Meds: . [START ON 01/25/2021] anagrelide  0.5 mg Oral Once per day on Mon Wed Fri  . anagrelide  1 mg Oral Daily  . aspirin EC  81 mg Oral Daily  . atorvastatin  40 mg Oral Daily  . heparin  5,000 Units Subcutaneous Q8H  . lacosamide  100 mg Oral q AM   And  . lacosamide  150 mg Oral QPM  . sodium bicarbonate  650 mg Oral TID   Continuous Infusions: . sodium chloride 75 mL/hr at 01/23/21 0835  . dextrose 5 % and 0.9 % NaCl with KCl 20 mEq/L       LOS: 1 day   Time spent: 55mins Greater than 50% of this time was spent in counseling, explanation of diagnosis, planning of further management, and coordination of care.   Voice Recognition Viviann Spare dictation system was used to create this note, attempts have been made to correct errors. Please contact the author with questions and/or clarifications.   Florencia Reasons, MD PhD FACP [facility]  Available via Epic secure chat 7am-7pm for nonurgent issues Please page for urgent issues To page the attending provider between 7A-7P or the covering provider during after hours 7P-7A, please log into the web site [email or web address] and access using universal [place] password for that web site. If you do not have the password, please call the hospital operator.    01/23/2021, 5:30 PM

## 2021-01-23 NOTE — Consult Note (Signed)
Reason for Consult: c/f SBO Referring Physician: Florencia Reasons, MD  HPI: Sara [name] is an 65 y.o. female with a past medical history notable for hypertension, hyperlipidemia, diabetes mellitus, kidney disease, coronary artery disease with mild ischemic cardiomyopathy, OSA on CPAP. She went to [employer] last Friday with her husband for dinner and shortly after that her husband started having nausea vomiting and diarrhea.  Not long after that she started developing the same symptoms.  She presented to her PCP at the beginning of the week with no specific treatment was initiated.  Her symptoms however did not improve throughout the week and her husband made her present to urgent care yesterday where she was found to have electrolyte imbalances and was referred to the ED for further work-up and management.  From there she was admitted to the inpatient floor last night.  Today she continues to report some nausea but no emesis and her last bowel movement was this morning at 7 AM.  She denies any abdominal pain.  Abdominal x-ray was obtained that raise concern for small bowel obstruction.  Therefore general surgery was consulted for treatment recommendations.  Past Medical History:  Diagnosis Date  . Diabetes mellitus   . Gout   . Hyperlipemia   . Hypertension   . LVH (left ventricular hypertrophy)    12/21/09 Echo -mild asymmetric LVH,EF =>55%  . Morbid obesity ([place])   . OSA (obstructive sleep apnea)   . PAD (peripheral artery disease) ([place])   . Renal artery stenosis ([place]) 12/13/2010   PTCA and Stent - right  . Seizures ([place])   . Syncopal episodes   . Thrombocytosis     Past Surgical History:  Procedure Laterality Date  . BREAST CYST EXCISION     S/P Benign Right Breast Cyst Removal.  . BREAST REDUCTION SURGERY  1995   S/P Bilateral breast reduction  . [place]   Times  Two.  . CORONARY ARTERY BYPASS GRAFT N/A 04/21/2017   Procedure: CORONARY ARTERY BYPASS  GRAFTING (CABG) x 2, USING LEFT MAMMARY ARTERY AND RIGHT GREATER SAPHENOUS VEIN HARVESTED ENDOSCOPICALLY;  Surgeon: [name], MD;  Location: [facility];  Service: Open Heart Surgery;  Laterality: N/A;  . EXTREMITY CYST EXCISION  1993   left wrist  . LEFT HEART CATH AND CORONARY ANGIOGRAPHY N/A 04/17/2017   Procedure: Left Heart Cath and Coronary Angiography;  Surgeon: Jettie Booze, MD;  Location: [facility];  Service: Cardiovascular;  Laterality: N/A;  . PARTIAL HYSTERECTOMY  1994  . renal artery stent placement Right   . TEE WITHOUT CARDIOVERSION N/A 04/21/2017   Procedure: TRANSESOPHAGEAL ECHOCARDIOGRAM (TEE);  Surgeon: [name], MD;  Location: [facility];  Service: Open Heart Surgery;  Laterality: N/A;    Family History  Problem Relation Age of Onset  . Heart failure Mother   . Diabetes Father   . Diabetes Sister   . Hypertension Sister   . Hypertension Sister   . Hypertension Sister   . Breast cancer Maternal Aunt        2 maternal aunts had breast cancer.  . Breast cancer Maternal Grandmother     Social History:  reports that she has never smoked. She has never used smokeless tobacco. She reports that she does not drink alcohol and does not use drugs.  Allergies:  Allergies  Allergen Reactions  . Sulfa Antibiotics Itching    Medications: I have reviewed the patient's current medications.  Results for orders  placed or performed during the hospital encounter of 01/22/21 (from the past 48 hour(s))  CBC with Differential     Status: Abnormal   Collection Time: 01/22/21  5:45 PM  Result Value Ref Range   WBC 8.1 4.0 - 10.5 K/uL   RBC 4.65 3.87 - 5.11 MIL/uL   Hemoglobin 13.6 12.0 - 15.0 g/dL   HCT 39.2 36.0 - 46.0 %   MCV 84.3 80.0 - 100.0 fL   MCH 29.2 26.0 - 34.0 pg   MCHC 34.7 30.0 - 36.0 g/dL   RDW 13.5 11.5 - 15.5 %   Platelets 246 150 - 400 K/uL   nRBC 0.0 0.0 - 0.2 %   Neutrophils Relative % 72 %   Neutro Abs 5.8 1.7 - 7.7 K/uL   Lymphocytes  Relative 8 %   Lymphs Abs 0.6 (L) 0.7 - 4.0 K/uL   Monocytes Relative 17 %   Monocytes Absolute 1.4 (H) 0.1 - 1.0 K/uL   Eosinophils Relative 3 %   Eosinophils Absolute 0.2 0.0 - 0.5 K/uL   Basophils Relative 0 %   Basophils Absolute 0.0 0.0 - 0.1 K/uL   WBC Morphology See Note     Comment: Vaculated Neutrophils   nRBC 0 0 /100 WBC   Abs Immature Granulocytes 0.00 0.00 - 0.07 K/uL    Comment: Performed at [facility], [address] [place], [place] [address]  Comprehensive metabolic panel     Status: Abnormal   Collection Time: 01/22/21  5:45 PM  Result Value Ref Range   Sodium 118 (LL) 135 - 145 mmol/L    Comment: CRITICAL RESULT CALLED TO, READ BACK BY AND VERIFIED WITH: K.WILLIAM RN 1916 01/22/21 MCCORMICK K    Potassium 3.2 (L) 3.5 - 5.1 mmol/L   Chloride 74 (L) 98 - 111 mmol/L   CO2 25 22 - 32 mmol/L   Glucose, Bld 81 70 - 99 mg/dL    Comment: Glucose reference range applies only to samples taken after fasting for at least 8 hours.   BUN 83 (H) 8 - 23 mg/dL   Creatinine, Ser 6.02 (H) 0.44 - 1.00 mg/dL   Calcium 8.2 (L) 8.9 - 10.3 mg/dL   Total Protein 6.3 (L) 6.5 - 8.1 g/dL   Albumin 2.8 (L) 3.5 - 5.0 g/dL   AST 25 15 - 41 U/L   ALT 22 0 - 44 U/L   Alkaline Phosphatase 67 38 - 126 U/L   Total Bilirubin 1.0 0.3 - 1.2 mg/dL   GFR, Estimated 7 (L) >60 mL/min    Comment: (NOTE) Calculated using the CKD-EPI Creatinine Equation (2021)    Anion gap 19 (H) 5 - 15    Comment: Performed at [facility], [place] [address] [place], [place] [address]  Lipase, blood     Status: None   Collection Time: 01/22/21  5:45 PM  Result Value Ref Range   Lipase 24 11 - 51 U/L    Comment: Performed at [facility], [place] [address] [place], [place] [address]  Brain natriuretic peptide     Status: Abnormal   Collection Time: 01/22/21  5:45 PM  Result Value Ref Range   B Natriuretic Peptide 376.8 (H) 0.0 - 100.0 pg/mL    Comment: Performed at [facility] [address] [place], [place] [address]  Blood culture (routine x 2)     Status: None (Preliminary result)   Collection Time: 01/22/21  5:45 PM  Specimen: BLOOD  Result Value Ref Range   Specimen Description BLOOD SITE NOT SPECIFIED    Special Requests      BOTTLES DRAWN AEROBIC AND ANAEROBIC Blood Culture results may not be optimal due to an inadequate volume of blood received in culture bottles   Culture      NO GROWTH < 24 HOURS Performed at [facility] [address], [place], [place] [address]    Report Status PENDING   TSH     Status: None   Collection Time: 01/22/21  5:45 PM  Result Value Ref Range   TSH 0.845 0.350 - 4.500 uIU/mL    Comment: Performed by a 3rd Generation assay with a functional sensitivity of <=0.01 uIU/mL. Performed at [facility], [place] [address] [place], [place] [address]   Resp Panel by RT-PCR (Flu A&B, Covid) Nasopharyngeal Swab     Status: None   Collection Time: 01/22/21  5:51 PM   Specimen: Nasopharyngeal Swab; Nasopharyngeal(NP) swabs in vial transport medium  Result Value Ref Range   SARS Coronavirus 2 by RT PCR NEGATIVE NEGATIVE    Comment: (NOTE) SARS-CoV-2 target nucleic acids are NOT DETECTED.  The SARS-CoV-2 RNA is generally detectable in upper respiratory specimens during the acute phase of infection. The lowest concentration of SARS-CoV-2 viral copies this assay can detect is 138 copies/mL. A negative result does not preclude SARS-Cov-2 infection and should not be used as the sole basis for treatment or other patient management decisions. A negative result may occur with  improper specimen collection/handling, submission of specimen other than nasopharyngeal swab, presence of viral mutation(s) within the areas targeted by this assay, and inadequate number of viral copies(<138 copies/mL). A negative result must be combined with clinical observations, patient history, and epidemiological information. The expected result is  Negative.  Fact Sheet for Patients:  EntrepreneurPulse.com.au  Fact Sheet for Healthcare Providers:  IncredibleEmployment.be  This test is no t yet approved or cleared by the [place] FDA and  has been authorized for detection and/or diagnosis of SARS-CoV-2 by FDA under an Emergency Use Authorization (EUA). This EUA will remain  in effect (meaning this test can be used) for the duration of the COVID-19 declaration under Section 564(b)(1) of the Act, 21 U.S.C.section 360bbb-3(b)(1), unless the authorization is terminated  or revoked sooner.       Influenza A by PCR NEGATIVE NEGATIVE   Influenza B by PCR NEGATIVE NEGATIVE    Comment: (NOTE) The Xpert Xpress SARS-CoV-2/FLU/RSV plus assay is intended as an aid in the diagnosis of influenza from Nasopharyngeal swab specimens and should not be used as a sole basis for treatment. Nasal washings and aspirates are unacceptable for Xpert Xpress SARS-CoV-2/FLU/RSV testing.  Fact Sheet for Patients: EntrepreneurPulse.com.au  Fact Sheet for Healthcare Providers: IncredibleEmployment.be  This test is not yet approved or cleared by the [place] FDA and has been authorized for detection and/or diagnosis of SARS-CoV-2 by FDA under an Emergency Use Authorization (EUA). This EUA will remain in effect (meaning this test can be used) for the duration of the COVID-19 declaration under Section 564(b)(1) of the Act, 21 U.S.C. section 360bbb-3(b)(1), unless the authorization is terminated or revoked.  Performed at [facility], [place] [address] [place], [place] [address]   C Difficile Quick Screen w PCR reflex     Status: None   Collection Time: 01/22/21 11:15 PM   Specimen: STOOL  Result Value Ref Range   C Diff antigen NEGATIVE NEGATIVE  C Diff toxin NEGATIVE NEGATIVE   C Diff interpretation No C. difficile detected.     Comment: Performed at [facility], [place] [address], [place], [place] [address]  HIV Antibody (routine testing w rflx)     Status: None   Collection Time: 01/23/21 12:33 AM  Result Value Ref Range   HIV Screen 4th Generation wRfx Non Reactive Non Reactive    Comment: Performed at [facility], [place] [address], [place], [place] [address]  Basic metabolic panel     Status: Abnormal   Collection Time: 01/23/21 12:33 AM  Result Value Ref Range   Sodium 120 (L) 135 - 145 mmol/L   Potassium 2.8 (L) 3.5 - 5.1 mmol/L   Chloride 81 (L) 98 - 111 mmol/L   CO2 19 (L) 22 - 32 mmol/L   Glucose, Bld 63 (L) 70 - 99 mg/dL    Comment: Glucose reference range applies only to samples taken after fasting for at least 8 hours.   BUN 87 (H) 8 - 23 mg/dL   Creatinine, Ser 5.69 (H) 0.44 - 1.00 mg/dL   Calcium 8.0 (L) 8.9 - 10.3 mg/dL   GFR, Estimated 8 (L) >60 mL/min    Comment: (NOTE) Calculated using the CKD-EPI Creatinine Equation (2021)    Anion gap 20 (H) 5 - 15    Comment: Performed at [facility] [address], [place], [place] [address]  Basic metabolic panel     Status: Abnormal   Collection Time: 01/23/21  3:54 AM  Result Value Ref Range   Sodium 119 (LL) 135 - 145 mmol/L    Comment: CRITICAL RESULT CALLED TO, READ BACK BY AND VERIFIED WITH: S WHITEHORN,RN 01/23/2021 0618 WILDERK    Potassium 3.4 (L) 3.5 - 5.1 mmol/L   Chloride 84 (L) 98 - 111 mmol/L   CO2 13 (L) 22 - 32 mmol/L   Glucose, Bld 58 (L) 70 - 99 mg/dL    Comment: Glucose reference range applies only to samples taken after fasting for at least 8 hours.   BUN 92 (H) 8 - 23 mg/dL   Creatinine, Ser 5.56 (H) 0.44 - 1.00 mg/dL   Calcium 7.9 (L) 8.9 - 10.3 mg/dL   GFR, Estimated 8 (L) >60 mL/min    Comment: (NOTE) Calculated using the CKD-EPI Creatinine Equation (2021)    Anion gap 22 (H) 5 - 15    Comment: Performed at [facility] [address], [place], [place] [address]  CBC     Status: Abnormal   Collection Time: 01/23/21  3:54 AM   Result Value Ref Range   WBC 5.9 4.0 - 10.5 K/uL   RBC 4.02 3.87 - 5.11 MIL/uL   Hemoglobin 12.1 12.0 - 15.0 g/dL   HCT 32.9 (L) 36.0 - 46.0 %   MCV 81.8 80.0 - 100.0 fL   MCH 30.1 26.0 - 34.0 pg   MCHC 36.8 (H) 30.0 - 36.0 g/dL   RDW 13.3 11.5 - 15.5 %   Platelets 284 150 - 400 K/uL   nRBC 0.0 0.0 - 0.2 %    Comment: Performed at [facility], [place] [address], [place], [place] [address]  Basic metabolic panel     Status: Abnormal   Collection Time: 01/23/21  9:55 AM  Result Value Ref Range   Sodium 124 (L) 135 - 145 mmol/L   Potassium 2.6 (LL) 3.5 - 5.1 mmol/L    Comment: CRITICAL RESULT CALLED  TO, READ BACK BY AND VERIFIED WITH: JULIA CERNY RN.@1120  ON 4.2.22 BY TCALDWELL MT.    Chloride 89 (L) 98 - 111 mmol/L   CO2 17 (L) 22 - 32 mmol/L   Glucose, Bld 58 (L) 70 - 99 mg/dL    Comment: Glucose reference range applies only to samples taken after fasting for at least 8 hours.   BUN 87 (H) 8 - 23 mg/dL   Creatinine, Ser 5.13 (H) 0.44 - 1.00 mg/dL   Calcium 8.0 (L) 8.9 - 10.3 mg/dL   GFR, Estimated 9 (L) >60 mL/min    Comment: (NOTE) Calculated using the CKD-EPI Creatinine Equation (2021)    Anion gap 18 (H) 5 - 15    Comment: Performed at [facility] [address] [place], [place] [address]  Urinalysis, Complete w Microscopic Urine, Catheterized     Status: Abnormal   Collection Time: 01/23/21 10:29 AM  Result Value Ref Range   Color, Urine YELLOW YELLOW   APPearance HAZY (A) CLEAR   Specific Gravity, Urine 1.010 1.005 - 1.030   pH 5.0 5.0 - 8.0   Glucose, UA NEGATIVE NEGATIVE mg/dL   Hgb urine dipstick NEGATIVE NEGATIVE   Bilirubin Urine NEGATIVE NEGATIVE   Ketones, ur NEGATIVE NEGATIVE mg/dL   Protein, ur NEGATIVE NEGATIVE mg/dL   Nitrite NEGATIVE NEGATIVE   Leukocytes,Ua NEGATIVE NEGATIVE   RBC / HPF 0-5 0 - 5 RBC/hpf   WBC, UA 0-5 0 - 5 WBC/hpf   Bacteria, UA RARE (A) NONE SEEN   Squamous Epithelial / LPF 0-5 0 - 5   Non Squamous Epithelial 0-5  (A) NONE SEEN    Comment: Performed at [facility], [place] [address] [place], [place] [address]  Sodium, urine, random     Status: None   Collection Time: 01/23/21 10:30 AM  Result Value Ref Range   Sodium, Ur 11 mmol/L    Comment: Performed at [facility] [address], [place], [place] [address]  Creatinine, urine, random     Status: None   Collection Time: 01/23/21 10:30 AM  Result Value Ref Range   Creatinine, Urine 105.79 mg/dL    Comment: Performed at [facility] [address] [place], [place] [address]  Basic metabolic panel     Status: Abnormal   Collection Time: 01/23/21  2:35 PM  Result Value Ref Range   Sodium 124 (L) 135 - 145 mmol/L   Potassium 2.6 (LL) 3.5 - 5.1 mmol/L    Comment: CRITICAL RESULT CALLED TO, READ BACK BY AND VERIFIED WITH: JULIA CERNY RN.@1513  ON 4.2.22 BY TCALDWELL MT.    Chloride 88 (L) 98 - 111 mmol/L   CO2 16 (L) 22 - 32 mmol/L   Glucose, Bld 56 (L) 70 - 99 mg/dL    Comment: Glucose reference range applies only to samples taken after fasting for at least 8 hours.   BUN 89 (H) 8 - 23 mg/dL   Creatinine, Ser 5.06 (H) 0.44 - 1.00 mg/dL   Calcium 7.9 (L) 8.9 - 10.3 mg/dL   GFR, Estimated 9 (L) >60 mL/min    Comment: (NOTE) Calculated using the CKD-EPI Creatinine Equation (2021)    Anion gap 20 (H) 5 - 15    Comment: Performed at [facility] [address], [place], [place] [address]  Glucose, capillary     Status: Abnormal   Collection Time: 01/23/21  5:17 PM  Result Value Ref Range   Glucose-Capillary  59 (L) 70 - 99 mg/dL    Comment: Glucose reference range applies only to samples taken after fasting for at least 8 hours.    DG Abd 1 View  Result Date: 01/23/2021 CLINICAL DATA:  Abdominal pain EXAM: ABDOMEN - 1 VIEW COMPARISON:  None. FINDINGS: Diffuse small bowel dilatation is identified with a paucity of colonic gas. No free air, portal venous gas, or pneumatosis. No other acute abnormalities. IMPRESSION: Small  bowel obstruction. Insert PRA call report Electronically Signed   By: Dorise Bullion III M.D   On: 01/23/2021 16:55   US RENAL  Result Date: 01/23/2021 CLINICAL DATA:  Acute kidney injury EXAM: RENAL / URINARY TRACT ULTRASOUND COMPLETE COMPARISON:  None. FINDINGS: Right Kidney: Renal measurements: 10.7 x 5.9 x 5.2 cm = volume: 172 mL. Increased renal echogenicity. Conspicuity is noted between the echogenic cortex and more hypoechoic renal pyramids. No evidence of hydronephrosis. Left Kidney: Renal measurements: 9.8 x 5.3 x 4.6 cm = volume: 123 mL. Increased renal echogenicity. Conspicuity is noted between the echogenic cortex and more hypoechoic renal pyramids. No evidence of hydronephrosis. Bladder: Foley catheter visualized in the decompressed bladder. Other: None. IMPRESSION: 1. No evidence of hydronephrosis. 2. Increased renal parenchymal echogenicity suggests underlying medical renal disease. 3. Foley catheter present within the collapsed bladder. Electronically Signed   By: Jacqulynn Cadet M.D.   On: 01/23/2021 11:37   DG Chest Portable 1 View  Result Date: 01/22/2021 CLINICAL DATA:  Chills and hypotension EXAM: PORTABLE CHEST 1 VIEW COMPARISON:  05/29/2017 FINDINGS: Post sternotomy changes. No focal consolidation or effusion. Normal cardiac size. No pneumothorax. Hazy atelectasis right base. IMPRESSION: Hazy atelectasis right base. Electronically Signed   By: Donavan Foil M.D.   On: 01/22/2021 17:13    Review of Systems  Constitutional: Positive for activity change, appetite change and fatigue. Negative for fever.  HENT: Positive for sore throat.   Eyes: Negative.   Respiratory: Negative.   Cardiovascular: Negative.   Gastrointestinal: Positive for diarrhea, nausea and vomiting. Negative for abdominal pain.  Endocrine: Negative.   Genitourinary: Negative.   Musculoskeletal: Negative.   Skin: Negative.   Neurological: Negative.   Psychiatric/Behavioral: Negative.    Blood pressure  (!) 126/55, pulse 94, temperature 97.6 F (36.4 C), temperature source Oral, resp. rate 16, height 5\' 2"  (1.575 m), weight 80.2 kg, SpO2 96 %. Physical Exam Constitutional:      General: She is not in acute distress.    Appearance: Normal appearance.  Cardiovascular:     Rate and Rhythm: Normal rate and regular rhythm.  Pulmonary:     Effort: Pulmonary effort is normal.     Breath sounds: Normal breath sounds.  Abdominal:     General: Abdomen is flat. There is distension.     Palpations: Abdomen is soft.     Tenderness: There is no abdominal tenderness.     Hernia: A hernia is present.  Neurological:     Mental Status: She is alert.     Assessment/Plan: Sara [name] is an 65 y.o. female with a past medical history notable for hypertension, hyperlipidemia, diabetes mellitus, kidney disease, coronary artery disease with mild ischemic cardiomyopathy, OSA on CPAP.  She was admitted to the hospital yesterday with a several day history of nausea, vomiting, and also diarrhea.  Her husband had similar symptoms that resolved on their own and he is feeling well by now.  A x-ray of her abdomen was obtained earlier today, which raise concern for small bowel obstruction.  On exam her abdomen is soft with mild distention and she denies any tenderness.  She does have a sizable epigastric hernia that seems to contain bowel.  Her surgical history is only notable for two C-sections. Overall, with the ongoing diarrhea however suspicion for small bowel obstruction is low.  However, the small bowel is significantly dilated on abdominal x-ray and she has an epigastric/ventral hernia containing bowel.  Therefore, we will obtain a CT scan of her abdomen and pelvis to further investigate.  Plan: CT abdomen/pelvis with PO contrast Would defer NG tube for now General Surgery will continue to follow. Please page with any additional questions or concerns  Caryn Bee 01/23/2021, 6:14 PM

## 2021-01-23 NOTE — Progress Notes (Signed)
TR[facility] night shift.  The nursing staff reports the patient is having nausea not relieved by Zofran.  She is supposed to take oral contrast for CT abdomen/pelvis.  A one-time prochlorperazine 10 mg IVP x1 order has been placed.  Tennis Must, MD.

## 2021-01-23 NOTE — Progress Notes (Signed)
Paged Dr. Johnney Ou of nephrology for the okay to place midline IV to patient. Dr. Johnney Ou confirmed that it is okay for the patient to have midline placed.

## 2021-01-23 NOTE — Progress Notes (Signed)
Consult placed for midline. Pt has history of renal disease and a CRCL of 12. Spoke with RN to verify midline order with renal MD.

## 2021-01-23 NOTE — Progress Notes (Signed)
Lab called with a critical lab value: Sodium 119.  Contacted on call via [facility]. Awaiting response.  Received order for NS 1000 mL bolus STAT.

## 2021-01-24 ENCOUNTER — Inpatient Hospital Stay (HOSPITAL_COMMUNITY): Payer: Medicare Other

## 2021-01-24 LAB — BASIC METABOLIC PANEL
Anion gap: 15 (ref 5–15)
Anion gap: 15 (ref 5–15)
Anion gap: 11 (ref 5–15)
Anion gap: 9 (ref 5–15)
BUN: 83 mg/dL — ABNORMAL HIGH (ref 8–23)
BUN: 84 mg/dL — ABNORMAL HIGH (ref 8–23)
BUN: 75 mg/dL — ABNORMAL HIGH (ref 8–23)
BUN: 67 mg/dL — ABNORMAL HIGH (ref 8–23)
CO2: 18 mmol/L — ABNORMAL LOW (ref 22–32)
CO2: 17 mmol/L — ABNORMAL LOW (ref 22–32)
CO2: 20 mmol/L — ABNORMAL LOW (ref 22–32)
CO2: 24 mmol/L (ref 22–32)
Calcium: 8 mg/dL — ABNORMAL LOW (ref 8.9–10.3)
Calcium: 8 mg/dL — ABNORMAL LOW (ref 8.9–10.3)
Calcium: 8 mg/dL — ABNORMAL LOW (ref 8.9–10.3)
Calcium: 8.6 mg/dL — ABNORMAL LOW (ref 8.9–10.3)
Chloride: 90 mmol/L — ABNORMAL LOW (ref 98–111)
Chloride: 94 mmol/L — ABNORMAL LOW (ref 98–111)
Chloride: 94 mmol/L — ABNORMAL LOW (ref 98–111)
Chloride: 97 mmol/L — ABNORMAL LOW (ref 98–111)
Creatinine, Ser: 4.65 mg/dL — ABNORMAL HIGH (ref 0.44–1.00)
Creatinine, Ser: 4.48 mg/dL — ABNORMAL HIGH (ref 0.44–1.00)
Creatinine, Ser: 3.91 mg/dL — ABNORMAL HIGH (ref 0.44–1.00)
Creatinine, Ser: 3.19 mg/dL — ABNORMAL HIGH (ref 0.44–1.00)
GFR, Estimated: 10 mL/min — ABNORMAL LOW (ref >60)
GFR, Estimated: 10 mL/min — ABNORMAL LOW (ref >60)
GFR, Estimated: 12 mL/min — ABNORMAL LOW (ref >60)
GFR, Estimated: 15 mL/min — ABNORMAL LOW (ref >60)
Glucose, Bld: 113 mg/dL — ABNORMAL HIGH (ref 70–99)
Glucose, Bld: 113 mg/dL — ABNORMAL HIGH (ref 70–99)
Glucose, Bld: 123 mg/dL — ABNORMAL HIGH (ref 70–99)
Glucose, Bld: 148 mg/dL — ABNORMAL HIGH (ref 70–99)
Potassium: 3.2 mmol/L — ABNORMAL LOW (ref 3.5–5.1)
Potassium: 3.1 mmol/L — ABNORMAL LOW (ref 3.5–5.1)
Potassium: 3.3 mmol/L — ABNORMAL LOW (ref 3.5–5.1)
Potassium: 3.5 mmol/L (ref 3.5–5.1)
Sodium: 123 mmol/L — ABNORMAL LOW (ref 135–145)
Sodium: 126 mmol/L — ABNORMAL LOW (ref 135–145)
Sodium: 125 mmol/L — ABNORMAL LOW (ref 135–145)
Sodium: 130 mmol/L — ABNORMAL LOW (ref 135–145)

## 2021-01-24 LAB — CBC
HCT: 35.5 % — ABNORMAL LOW (ref 36.0–46.0)
Hemoglobin: 12.7 g/dL (ref 12.0–15.0)
MCH: 29.2 pg (ref 26.0–34.0)
MCHC: 35.8 g/dL (ref 30.0–36.0)
MCV: 81.6 fL (ref 80.0–100.0)
Platelets: 226 10*3/uL (ref 150–400)
RBC: 4.35 MIL/uL (ref 3.87–5.11)
RDW: 13.3 % (ref 11.5–15.5)
WBC: 8 10*3/uL (ref 4.0–10.5)
nRBC: 0 % (ref 0.0–0.2)

## 2021-01-24 LAB — LACTIC ACID, PLASMA
Lactic Acid, Venous: 0.8 mmol/L (ref 0.5–1.9)
Lactic Acid, Venous: 0.8 mmol/L (ref 0.5–1.9)

## 2021-01-24 LAB — URINE CULTURE: Culture: NO GROWTH

## 2021-01-24 LAB — MAGNESIUM: Magnesium: 2.5 mg/dL — ABNORMAL HIGH (ref 1.7–2.4)

## 2021-01-24 MED ORDER — POTASSIUM CHLORIDE 10 MEQ/100ML IV SOLN
10.0000 meq | INTRAVENOUS | Status: AC
  Administered 2021-01-24 (×4): 10 meq via INTRAVENOUS
  Filled 2021-01-24 (×4): qty 100

## 2021-01-24 MED ORDER — CHLORHEXIDINE GLUCONATE CLOTH 2 % EX PADS
6.0000 | MEDICATED_PAD | Freq: Every day | CUTANEOUS | Status: DC
  Administered 2021-01-24 – 2021-01-26 (×3): 6 via TOPICAL

## 2021-01-24 MED ORDER — SODIUM CHLORIDE 0.9 % IV SOLN
150.0000 mg | Freq: Every evening | INTRAVENOUS | Status: DC
  Administered 2021-01-24 – 2021-01-27 (×4): 150 mg via INTRAVENOUS
  Filled 2021-01-24 (×6): qty 15

## 2021-01-24 MED ORDER — PHENOL 1.4 % MT LIQD
1.0000 | OROMUCOSAL | Status: DC | PRN
  Administered 2021-01-24: 1 via OROMUCOSAL
  Filled 2021-01-24: qty 177

## 2021-01-24 MED ORDER — POTASSIUM CHLORIDE 10 MEQ/100ML IV SOLN
10.0000 meq | INTRAVENOUS | Status: AC
  Administered 2021-01-24 (×2): 10 meq via INTRAVENOUS
  Filled 2021-01-24 (×2): qty 100

## 2021-01-24 MED ORDER — METOPROLOL TARTRATE 5 MG/5ML IV SOLN
2.5000 mg | Freq: Three times a day (TID) | INTRAVENOUS | Status: DC
  Administered 2021-01-24 – 2021-01-28 (×12): 2.5 mg via INTRAVENOUS
  Filled 2021-01-24 (×12): qty 5

## 2021-01-24 MED ORDER — SODIUM CHLORIDE 0.9 % IV SOLN
100.0000 mg | Freq: Every morning | INTRAVENOUS | Status: DC
  Administered 2021-01-24 – 2021-01-28 (×5): 100 mg via INTRAVENOUS
  Filled 2021-01-24 (×7): qty 10

## 2021-01-24 NOTE — Progress Notes (Signed)
Spoke with pt about CPAP use. Pt has NG tube and has declined use tonight. RT will continue to monitor

## 2021-01-24 NOTE — Progress Notes (Signed)
Progress Note: General Surgery Service   Chief Complaint/Subjective: Malaised, bowel movements overnight, NG uncomfortable  Objective: Vital signs in last 24 hours: Temp:  [97.6 F (36.4 C)-98.4 F (36.9 C)] 97.8 F (36.6 C) (04/03 0843) Pulse Rate:  [94-108] 105 (04/03 0843) Resp:  [15-17] 17 (04/03 0843) BP: (117-136)/(55-65) 117/57 (04/03 0843) SpO2:  [95 %-98 %] 97 % (04/03 0843) Last BM Date: 01/24/21  Intake/Output from previous day: 04/02 0701 - 04/03 0700 In: 200 [P.O.:200] Out: 2635 [Urine:635; Emesis/NG output:2000] Intake/Output this shift: No intake/output data recorded.  Gen: NAD  Resp: nonlabored  Card: tachycardic  Abd: soft, epigastric hernia soft with bowel sounds on palpation, NG with bilious output  Lab Results: CBC  Recent Labs    01/23/21 0354 01/24/21 0239  WBC 5.9 8.0  HGB 12.1 12.7  HCT 32.9* 35.5*  PLT 284 226   BMET Recent Labs    01/24/21 0239 01/24/21 0904  NA 126* 125*  K 3.1* 3.3*  CL 94* 94*  CO2 17* 20*  GLUCOSE 113* 123*  BUN 84* 75*  CREATININE 4.48* 3.91*  CALCIUM 8.0* 8.0*   PT/INR No results for input(s): LABPROT, INR in the last 72 hours. ABG No results for input(s): PHART, HCO3 in the last 72 hours.  Invalid input(s): PCO2, PO2  Anti-infectives: Anti-infectives (From admission, onward)   None      Medications: Scheduled Meds: . Chlorhexidine Gluconate Cloth  6 each Topical Daily  . heparin  5,000 Units Subcutaneous Q8H   Continuous Infusions: . dextrose 5 % and 0.9 % NaCl with KCl 20 mEq/L 75 mL/hr at 01/24/21 0919  . lacosamide ([name]) IV 100 mg (01/24/21 1128)  . lacosamide ([name]) IV    . potassium chloride 10 mEq (01/24/21 1216)   PRN Meds:.acetaminophen **OR** acetaminophen, ondansetron **OR** ondansetron (ZOFRAN) IV, phenol, sodium chloride flush  Assessment/Plan: 65 yo female with epigastric hernia with colon in sac and proximal dilation and dilation of small intestine. Having bowel  movements also has sick contact for enteritis, GI panel negative -NG tube today -follow for enteritis vs intermittent obstruction -think she will require surgery, but if currently resolving infection may be worthwhile to wait and go short term f/u for surgery within the month   LOS: 2 days   Mickeal Skinner, MD [facility]

## 2021-01-24 NOTE — Plan of Care (Signed)

## 2021-01-24 NOTE — Evaluation (Signed)
Physical Therapy Evaluation Patient Details Name: Sara [name] MRN: [id number] DOB: Nov 27, 1955 Today's Date: 01/24/2021   History of Present Illness  Pt adm 4/1 with nausea, vomiting, and diarrhea. Pt found to have acute renal failure superimposed on ckd. x-ray showed possible SBO and surgery consulted. Surgery found epigastric hernia with colon in sac and proximal dilation and dilation of small intestine. Surgery followind for enteritis vs intermittent obstruction.  PMH - DM, gout, HTN, obesity, PAD, seizures, ckd, OSA, CAD, CABG  Clinical Impression  Pt presents to PT with decreased mobility due to illness and inactivity. Expect pt will make good progress back to baseline with mobility. Will follow acutely but doubt pt will need PT after DC.      Follow Up Recommendations No PT follow up    Equipment Recommendations  None recommended by PT    Recommendations for Other Services       Precautions / Restrictions Precautions Precautions: Other (comment) Precaution Comments: NG tube      Mobility  Bed Mobility Overal bed mobility: Needs Assistance Bed Mobility: Supine to Sit     Supine to sit: Min guard     General bed mobility comments: Assist for safety and lines    Transfers Overall transfer level: Needs assistance Equipment used: None;Rolling walker (2 wheeled) Transfers: Sit to/from [facility] Sit to Stand: Min guard Stand pivot transfers: Min guard       General transfer comment: Assist for safety and lines  Ambulation/Gait Ambulation/Gait assistance: Min guard Gait Distance (Feet): 200 Feet Assistive device: Rolling walker (2 wheeled) Gait Pattern/deviations: Step-through pattern;Decreased stride length Gait velocity: decr Gait velocity interpretation: 1.31 - 2.62 ft/sec, indicative of limited community ambulator General Gait Details: Assist for safety and Engineer, technical sales    Modified Rankin  (Stroke Patients Only)       Balance Overall balance assessment: Mild deficits observed, not formally tested                                           Pertinent Vitals/Pain Pain Assessment: No/denies pain    Home Living Family/patient expects to be discharged to:: Private residence Living Arrangements: Spouse/significant other;Children Available Help at Discharge: Family Type of Home: House         Home Equipment: None      Prior Function Level of Independence: Independent               Hand Dominance        Extremity/Trunk Assessment   Upper Extremity Assessment Upper Extremity Assessment: Overall WFL for tasks assessed    Lower Extremity Assessment Lower Extremity Assessment: Overall WFL for tasks assessed       Communication   Communication: No difficulties  Cognition Arousal/Alertness: Awake/alert Behavior During Therapy: WFL for tasks assessed/performed Overall Cognitive Status: Within Functional Limits for tasks assessed                                        General Comments      Exercises     Assessment/Plan    PT Assessment Patient needs continued PT services  PT Problem List Decreased mobility       PT Treatment Interventions Gait training;DME instruction;Functional  mobility training;Therapeutic activities;Therapeutic exercise;Patient/family education    PT Goals (Current goals can be found in the Care Plan section)  Acute Rehab PT Goals Patient Stated Goal: return home PT Goal Formulation: With patient Time For Goal Achievement: 01/31/21 Potential to Achieve Goals: Good    Frequency Min 3X/week   Barriers to discharge        Co-evaluation               AM-PAC PT "6 Clicks" Mobility  Outcome Measure Help needed turning from your back to your side while in a flat bed without using bedrails?: None Help needed moving from lying on your back to sitting on the side of a flat bed  without using bedrails?: A Little Help needed moving to and from a bed to a chair (including a wheelchair)?: A Little Help needed standing up from a chair using your arms (e.g., wheelchair or bedside chair)?: A Little Help needed to walk in hospital room?: A Little Help needed climbing 3-5 steps with a railing? : A Little 6 Click Score: 19    End of Session   Activity Tolerance: Patient tolerated treatment well Patient left: in chair;with call bell/phone within reach Nurse Communication: Mobility status;Other (comment) (chair alarm in place but not turned on) PT Visit Diagnosis: Other abnormalities of gait and mobility (R26.89)    Time: 1415-1450 PT Time Calculation (min) (ACUTE ONLY): 35 min   Charges:   PT Evaluation $PT Eval Low Complexity: 1 Low PT Treatments $Gait Training: 8-22 mins        [place] Pager [phone] Office [place] 01/24/2021, 4:05 PM

## 2021-01-24 NOTE — Progress Notes (Signed)
PROGRESS NOTE    Sara [name]  JJK:[id number] DOB: 10-29-1955 DOA: 01/22/2021 PCP: [name], MD    Chief Complaint  Patient presents with  . Hypotension    Since Monday N/V and diarrhea. Hypotensive, 80/40 and HR at 100 at urgent care. Dizziness with movement.      Brief Narrative:  Sara [name] is a 65 y.o. female with medical history significant for CAD, mild ischemic cardiomyopathy, OSA on CPAP, seizures, chronic kidney disease stage IIIa, hypertension, and essential thrombocythemia, now presenting to the emergency department for evaluation of nausea, vomiting, and diarrhea.  Patient reports that she developed severe nausea and nonbloody vomiting about 1 week ago.   Over the past couple days, she has had progressive lethargy, seem to have some mild confusion, and this prompted family to bring her to urgent care today.  She was noted to have blood pressure of 72/55 at urgent care and sent to the ED.  Subjective:  Ct showed high grade sbo, ng placed last night with 2liter output documented Reports throat irritation from NG Reports feeling weak, Continue to have loose stool , currently denies ab pain No fever   Assessment & Plan:   Principal Problem:   Acute renal failure superimposed on stage 3a chronic kidney disease (HCC) Active Problems:   Essential thrombocythemia (HCC)   Hypotension due to hypovolemia   Chronic diastolic CHF (congestive heart failure) (HCC)   Sleep apnea with use of continuous positive airway pressure (CPAP)   Seizures (HCC)   CAD s/p CABG    Hyponatremia   Ischemic cardiomyopathy   Hypokalemia  N/V/D/SBO --C. difficile testing negative, GI PCR panel negative - KUB showed SBO, ct showed high grade sbo -Ng placed, keep npo, continue IV hydration, antiemetics - general surgery consulted, will follow recommendation   AKI on CKD 3 a -Creatinine significant elevated at 6.02 , bun 83 n ER, cr on 3/29 was 1.93, cr on 3/24 was  1.33 -Likely prerenal from dehydration and hypotension with continued use of ARB at home -Renal ultrasound no hydronephrosis -Urine specific gravity 1.01, rare bacteria, no RBC, no WBC, negative nitrite, no protein -Urine culture in process -Place Foley to monitor urine output -Hold Micardis -Continue IV hydration, renal dosing meds, cr improving slowly  Hyponatremia -Sodium 118 in the setting of hypovolemia -Continue normal saline, monitor BMP every 4 hours, rate of correction donot exceed 0.43mmol/l/hr -over all improving, change q4hr lab to q12hr  Hypoglycemia -Currently n.p.o. due to small bowel obstruction -change IV fluids from normal saline to D5 normal saline with K  Hypokalemia, remain low, replace K per IV,  Mag 2.5  Chronic diastolic CHF -Present with dehydration -Continue hydration, monitor volume status  History of CAD status post CABG in 2018 Currently denies chest pain, she is n.p.o., hold statin, aspirin  Hypertension Present with hypotension Home blood pressure medication held initially, she received hydration, blood pressure normalized -schedule IV Lopressor 2.5mg  q8hrs with holding parameters due to n.p.o. status  History of essential thrombocythemia On anagrelide at home, currently npo Platelets wnl  History of seizure On [name] at home ,currently npo, change to iv vimpat Report has not had seizure for many years  OSA on CPAP Continue     Body mass index is 32.34 kg/m.Marland Kitchen      Unresulted Labs (From admission, onward)          Start     Ordered   01/25/21 0500  Lactic acid, plasma  Tomorrow morning,  R        01/24/21 0835   01/24/21 6720  Basic metabolic panel  5A & 5P,   R      01/24/21 0835   01/23/21 0500  CBC  Daily,   R      01/22/21 2133   01/22/21 1555  Blood culture (routine x 2)  BLOOD CULTURE X 2,   STAT      01/22/21 1555            DVT prophylaxis: heparin injection 5,000 Units Start: 01/22/21 2200   Code  Status:full Family Communication: husband and niece at bedside on 4/2 Disposition:   Status is: Inpatient  Dispo: The patient is from: home              Anticipated d/c is to: home              Anticipated d/c date is: >48hrs                Consultants:   General surgery  Procedures:   NG placement on 4/2 night  Antimicrobials:   Anti-infectives (From admission, onward)   None          Objective: Vitals:   01/23/21 1503 01/23/21 1941 01/24/21 0308 01/24/21 0843  BP: (!) 126/55 131/65 136/63 (!) 117/57  Pulse: 94 (!) 108 (!) 103 (!) 105  Resp: 16 16 15 17   Temp: 97.6 F (36.4 C) 98.4 F (36.9 C) 98.1 F (36.7 C) 97.8 F (36.6 C)  TempSrc: Oral Oral Oral Oral  SpO2: 96% 95% 98% 97%  Weight:      Height:        Intake/Output Summary (Last 24 hours) at 01/24/2021 1037 Last data filed at 01/24/2021 9470 Gross per 24 hour  Intake 200 ml  Output 2600 ml  Net -2400 ml   Filed Weights   01/22/21 1553 01/22/21 2333  Weight: 80.3 kg 80.2 kg    Examination:  General exam: appear weak, but calm, NAD, + NG Respiratory system: Clear to auscultation. Respiratory effort normal. Cardiovascular system: S1 & S2 heard, RRR. + 3/6 precordial murmur , No pedal edema. Prior CABG Gastrointestinal system: ventral hernia softer today, nontender, + bowel sounds heard. Central nervous system: Alert and oriented. No focal neurological deficits. Extremities: generalized weakness Skin: No rashes, lesions or ulcers Psychiatry: Judgement and insight appear normal. Mood & affect appropriate.     Data Reviewed: I have personally reviewed following labs and imaging studies  CBC: Recent Labs  Lab 01/19/21 1723 01/22/21 1745 01/23/21 0354 01/24/21 0239  WBC 19.0* 8.1 5.9 8.0  NEUTROABS  --  5.8  --   --   HGB 14.8 13.6 12.1 12.7  HCT 41.9 39.2 32.9* 35.5*  MCV 83 84.3 81.8 81.6  PLT 338 246 284 962    Basic Metabolic Panel: Recent Labs  Lab 01/23/21 1713 01/23/21 2037  01/24/21 0100 01/24/21 0239 01/24/21 0904  NA 124* 125* 123* 126* 125*  K 2.5* 3.0* 3.2* 3.1* 3.3*  CL 88* 89* 90* 94* 94*  CO2 19* 18* 18* 17* 20*  GLUCOSE 60* 89 113* 113* 123*  BUN 87* 86* 83* 84* 75*  CREATININE 4.89* 4.74* 4.65* 4.48* 3.91*  CALCIUM 8.2* 8.3* 8.0* 8.0* 8.0*  MG 2.5*  --   --  2.5*  --     GFR: Estimated Creatinine Clearance: 13.9 mL/min (A) (by C-G formula based on SCr of 3.91 mg/dL (H)).  Liver Function Tests: Recent Labs  Lab 01/22/21 1745  AST 25  ALT 22  ALKPHOS 67  BILITOT 1.0  PROT 6.3*  ALBUMIN 2.8*    CBG: Recent Labs  Lab 01/23/21 1717 01/23/21 1936  GLUCAP 59* 79     Recent Results (from the past 240 hour(s))  Novel Coronavirus, NAA ([facility])     Status: None   Collection Time: 01/19/21  4:37 PM   Specimen: Nasopharyngeal(NP) swabs in vial transport medium  Result Value Ref Range Status   SARS-CoV-2, NAA Not Detected Not Detected Final    Comment: This nucleic acid amplification test was developed and its performance characteristics determined by [employer]. Nucleic acid amplification tests include RT-PCR and TMA. This test has not been FDA cleared or approved. This test has been authorized by FDA under an Emergency Use Authorization (EUA). This test is only authorized for the duration of time the declaration that circumstances exist justifying the authorization of the emergency use of in vitro diagnostic tests for detection of SARS-CoV-2 virus and/or diagnosis of COVID-19 infection under section 564(b)(1) of the Act, 21 U.S.C. 831DVV-6(H) (1), unless the authorization is terminated or revoked sooner. When diagnostic testing is negative, the possibility of a false negative result should be considered in the context of a patient's recent exposures and the presence of clinical signs and symptoms consistent with COVID-19. An individual without symptoms of COVID-19 and who is not shedding SARS-CoV-2 virus wo uld expect  to have a negative (not detected) result in this assay.   SARS-COV-2, NAA 2 DAY TAT     Status: None   Collection Time: 01/19/21  4:37 PM  Result Value Ref Range Status   SARS-CoV-2, NAA 2 DAY TAT Performed  Final  Urine culture     Status: None   Collection Time: 01/22/21 10:02 AM   Specimen: Urine, Random  Result Value Ref Range Status   Specimen Description URINE, RANDOM  Final   Special Requests NONE  Final   Culture   Final    NO GROWTH Performed at [facility], [place] [address] [place], [place] [address]    Report Status 01/24/2021 FINAL  Final  Blood culture (routine x 2)     Status: None (Preliminary result)   Collection Time: 01/22/21  5:45 PM   Specimen: BLOOD  Result Value Ref Range Status   Specimen Description BLOOD SITE NOT SPECIFIED  Final   Special Requests   Final    BOTTLES DRAWN AEROBIC AND ANAEROBIC Blood Culture results may not be optimal due to an inadequate volume of blood received in culture bottles   Culture   Final    NO GROWTH < 24 HOURS Performed at [facility], [place] [address], [place],  [address]    Report Status PENDING  Incomplete  Resp Panel by RT-PCR (Flu A&B, Covid) Nasopharyngeal Swab     Status: None   Collection Time: 01/22/21  5:51 PM   Specimen: Nasopharyngeal Swab; Nasopharyngeal(NP) swabs in vial transport medium  Result Value Ref Range Status   SARS Coronavirus 2 by RT PCR NEGATIVE NEGATIVE Final    Comment: (NOTE) SARS-CoV-2 target nucleic acids are NOT DETECTED.  The SARS-CoV-2 RNA is generally detectable in upper respiratory specimens during the acute phase of infection. The lowest concentration of SARS-CoV-2 viral copies this assay can detect is 138 copies/mL. A negative result does not preclude SARS-Cov-2 infection and should not be used as the sole basis for treatment or other patient management decisions. A negative result  may occur with  improper specimen collection/handling, submission of specimen  other than nasopharyngeal swab, presence of viral mutation(s) within the areas targeted by this assay, and inadequate number of viral copies(<138 copies/mL). A negative result must be combined with clinical observations, patient history, and epidemiological information. The expected result is Negative.  Fact Sheet for Patients:  EntrepreneurPulse.com.au  Fact Sheet for Healthcare Providers:  IncredibleEmployment.be  This test is no t yet approved or cleared by the [place] FDA and  has been authorized for detection and/or diagnosis of SARS-CoV-2 by FDA under an Emergency Use Authorization (EUA). This EUA will remain  in effect (meaning this test can be used) for the duration of the COVID-19 declaration under Section 564(b)(1) of the Act, 21 U.S.C.section 360bbb-3(b)(1), unless the authorization is terminated  or revoked sooner.       Influenza A by PCR NEGATIVE NEGATIVE Final   Influenza B by PCR NEGATIVE NEGATIVE Final    Comment: (NOTE) The Xpert Xpress SARS-CoV-2/FLU/RSV plus assay is intended as an aid in the diagnosis of influenza from Nasopharyngeal swab specimens and should not be used as a sole basis for treatment. Nasal washings and aspirates are unacceptable for Xpert Xpress SARS-CoV-2/FLU/RSV testing.  Fact Sheet for Patients: EntrepreneurPulse.com.au  Fact Sheet for Healthcare Providers: IncredibleEmployment.be  This test is not yet approved or cleared by the [place] FDA and has been authorized for detection and/or diagnosis of SARS-CoV-2 by FDA under an Emergency Use Authorization (EUA). This EUA will remain in effect (meaning this test can be used) for the duration of the COVID-19 declaration under Section 564(b)(1) of the Act, 21 U.S.C. section 360bbb-3(b)(1), unless the authorization is terminated or revoked.  Performed at [facility], [place] [address], [place],  [place] [address]   C Difficile Quick Screen w PCR reflex     Status: None   Collection Time: 01/22/21 11:15 PM   Specimen: STOOL  Result Value Ref Range Status   C Diff antigen NEGATIVE NEGATIVE Final   C Diff toxin NEGATIVE NEGATIVE Final   C Diff interpretation No C. difficile detected.  Final    Comment: Performed at [facility], [place] [address] [place], [place] [address]  Gastrointestinal Panel by PCR , Stool     Status: None   Collection Time: 01/22/21 11:15 PM   Specimen: STOOL  Result Value Ref Range Status   Campylobacter species NOT DETECTED NOT DETECTED Final   Plesimonas shigelloides NOT DETECTED NOT DETECTED Final   Salmonella species NOT DETECTED NOT DETECTED Final   Yersinia enterocolitica NOT DETECTED NOT DETECTED Final   Vibrio species NOT DETECTED NOT DETECTED Final   Vibrio cholerae NOT DETECTED NOT DETECTED Final   Enteroaggregative E coli (EAEC) NOT DETECTED NOT DETECTED Final   Enteropathogenic E coli (EPEC) NOT DETECTED NOT DETECTED Final   Enterotoxigenic E coli (ETEC) NOT DETECTED NOT DETECTED Final   Shiga like toxin producing E coli (STEC) NOT DETECTED NOT DETECTED Final   Shigella/Enteroinvasive E coli (EIEC) NOT DETECTED NOT DETECTED Final   Cryptosporidium NOT DETECTED NOT DETECTED Final   Cyclospora cayetanensis NOT DETECTED NOT DETECTED Final   Entamoeba histolytica NOT DETECTED NOT DETECTED Final   Giardia lamblia NOT DETECTED NOT DETECTED Final   Adenovirus F40/41 NOT DETECTED NOT DETECTED Final   Astrovirus NOT DETECTED NOT DETECTED Final   Norovirus GI/GII NOT DETECTED NOT DETECTED Final   Rotavirus A NOT DETECTED NOT DETECTED Final   Sapovirus (I, II, IV, and V)  NOT DETECTED NOT DETECTED Final    Comment: Performed at [facility], [address] [place], [place] [address]         Radiology Studies: CT ABDOMEN PELVIS WO CONTRAST  Addendum Date: 01/23/2021   ADDENDUM REPORT: 01/23/2021 23:55 ADDENDUM: These results were called  by telephone at the time of interpretation on 01/23/2021 at 11:55 pm to provider PAUL STECHSCHULTE , who verbally acknowledged these results. Electronically Signed   By: Iven Finn M.D.   On: 01/23/2021 23:55   Result Date: 01/23/2021 CLINICAL DATA:  Bowel obstruction suspected. Nausea. Abdominal pain. EXAM: CT ABDOMEN AND PELVIS WITHOUT CONTRAST TECHNIQUE: Multidetector CT imaging of the abdomen and pelvis was performed following the standard protocol without IV contrast. COMPARISON:  X-ray abdomen 01/23/2021, ultrasound renal 01/23/2021, CT abdomen pelvis 12/30/2015 FINDINGS: Lower chest: Bibasilar linear atelectasis. Right lower lobe bronchial wall thickening. Coronary artery calcifications. Hepatobiliary: No focal liver abnormality. No gallstones, gallbladder wall thickening, or pericholecystic fluid. No biliary dilatation. Pancreas: No focal lesion. Normal pancreatic contour. No surrounding inflammatory changes. No main pancreatic ductal dilatation. Spleen: Normal in size without focal abnormality. Adrenals/Urinary Tract: No adrenal nodule bilaterally. No nephrolithiasis, no hydronephrosis, and no contour-deforming renal mass. No ureterolithiasis or hydroureter. The urinary bladder is decompressed and grossly unremarkable a Foley catheter is noted to terminate within the urinary bladder lumen. Stomach/Bowel: The stomach is distended with fluid. Air-fluid level is noted within the gastric lumen. Layering hyperdensity within the gastric lumen likely related to ingested medication. The majority of the small bowel is dilated with fluid with associated air-fluid levels. Suggestion of a transition point within the lower mid abdomen (3:70, 4:46). The ascending colon is distended with fluid with findings suggestive of pneumatosis. Appendix appears normal. Vascular/Lymphatic: No abdominal aorta or iliac aneurysm. At least moderate atherosclerotic plaque of the aorta and its branches. No abdominal, pelvic, or  inguinal lymphadenopathy. Reproductive: Status post hysterectomy. No adnexal masses. Other: No intraperitoneal free fluid. No intraperitoneal free gas. No organized fluid collection. Musculoskeletal: Moderate volume supraumbilical ventral wall hernia with an abdominal wall defect of 3.3 cm containing the mid transverse colon. Tiny fat containing paraumbilical hernia. Tiny fat containing left inguinal hernia. No suspicious lytic or blastic osseous lesions. No acute displaced fracture. Multilevel mild degenerative changes of the spine. IMPRESSION: 1. High-grade small-bowel obstruction within a transition point within the lower mid abdomen. 2. Fluid distended ascending colon and proximal transverse colon with a transition point at the entry site of the mid transverse colon into a supraumbilical hernia. Suggestion of pneumatosis of the ascending colon concerning for bowel ischemia. Please note limited evaluation for bowel ischemia on this noncontrast study. Recommend emergent surgical consultation. 3. Tiny fat containing periumbilical hernia and left inguinal hernia. 4.  Aortic Atherosclerosis (ICD10-I70.0). Electronically Signed: By: Iven Finn M.D. On: 01/23/2021 23:50   DG Abd 1 View  Result Date: 01/23/2021 CLINICAL DATA:  Abdominal pain EXAM: ABDOMEN - 1 VIEW COMPARISON:  None. FINDINGS: Diffuse small bowel dilatation is identified with a paucity of colonic gas. No free air, portal venous gas, or pneumatosis. No other acute abnormalities. IMPRESSION: Small bowel obstruction. Insert PRA call report Electronically Signed   By: Dorise Bullion III M.D   On: 01/23/2021 16:55   US RENAL  Result Date: 01/23/2021 CLINICAL DATA:  Acute kidney injury EXAM: RENAL / URINARY TRACT ULTRASOUND COMPLETE COMPARISON:  None. FINDINGS: Right Kidney: Renal measurements: 10.7 x 5.9 x 5.2 cm = volume: 172 mL. Increased renal echogenicity. Conspicuity is noted  between the echogenic cortex and more hypoechoic renal pyramids. No  evidence of hydronephrosis. Left Kidney: Renal measurements: 9.8 x 5.3 x 4.6 cm = volume: 123 mL. Increased renal echogenicity. Conspicuity is noted between the echogenic cortex and more hypoechoic renal pyramids. No evidence of hydronephrosis. Bladder: Foley catheter visualized in the decompressed bladder. Other: None. IMPRESSION: 1. No evidence of hydronephrosis. 2. Increased renal parenchymal echogenicity suggests underlying medical renal disease. 3. Foley catheter present within the collapsed bladder. Electronically Signed   By: Jacqulynn Cadet M.D.   On: 01/23/2021 11:37   DG Chest Portable 1 View  Result Date: 01/22/2021 CLINICAL DATA:  Chills and hypotension EXAM: PORTABLE CHEST 1 VIEW COMPARISON:  05/29/2017 FINDINGS: Post sternotomy changes. No focal consolidation or effusion. Normal cardiac size. No pneumothorax. Hazy atelectasis right base. IMPRESSION: Hazy atelectasis right base. Electronically Signed   By: Donavan Foil M.D.   On: 01/22/2021 17:13   DG Abd Portable 1V  Result Date: 01/24/2021 CLINICAL DATA:  NG tube placement EXAM: PORTABLE ABDOMEN - 1 VIEW COMPARISON:  01/23/2021 FINDINGS: An enteric tube has been placed with tip in the right upper quadrant consistent with location in the distal stomach. Gas-filled dilated small bowel consistent with small bowel obstruction. IMPRESSION: Enteric tube tip is in the right upper quadrant consistent with location in the distal stomach. Electronically Signed   By: Lucienne Capers M.D.   On: 01/24/2021 02:18        Scheduled Meds: . Chlorhexidine Gluconate Cloth  6 each Topical Daily  . heparin  5,000 Units Subcutaneous Q8H   Continuous Infusions: . dextrose 5 % and 0.9 % NaCl with KCl 20 mEq/L 75 mL/hr at 01/24/21 0919  . lacosamide ([name]) IV    . lacosamide ([name]) IV    . potassium chloride 10 mEq (01/24/21 0925)     LOS: 2 days   Time spent: 9mins Greater than 50% of this time was spent in counseling, explanation of  diagnosis, planning of further management, and coordination of care.   Voice Recognition Viviann Spare dictation system was used to create this note, attempts have been made to correct errors. Please contact the author with questions and/or clarifications.   Florencia Reasons, MD PhD FACP [facility]  Available via Epic secure chat 7am-7pm for nonurgent issues Please page for urgent issues To page the attending provider between 7A-7P or the covering provider during after hours 7P-7A, please log into the web site [email or web address] and access using universal [place] password for that web site. If you do not have the password, please call the hospital operator.    01/24/2021, 10:37 AM

## 2021-01-24 NOTE — Progress Notes (Signed)
[facility] night shift.  The patient's potassium level was 3.1 mmol/L.  Her creatinine level has decreased to 4.48 mg/dL.  Prior to developing AKI, in the last 7 months her creatinine has ranged from 1.22-1.39 mg/dL.  KCl 10 mEq IVPB x2 ordered.  Follow-up renal function electrolytes.  Tennis Must, MD.

## 2021-01-25 ENCOUNTER — Inpatient Hospital Stay (HOSPITAL_COMMUNITY): Payer: Medicare Other

## 2021-01-25 LAB — BASIC METABOLIC PANEL
Anion gap: 8 (ref 5–15)
Anion gap: 8 (ref 5–15)
BUN: 63 mg/dL — ABNORMAL HIGH (ref 8–23)
BUN: 51 mg/dL — ABNORMAL HIGH (ref 8–23)
CO2: 28 mmol/L (ref 22–32)
CO2: 29 mmol/L (ref 22–32)
Calcium: 8.8 mg/dL — ABNORMAL LOW (ref 8.9–10.3)
Calcium: 9.1 mg/dL (ref 8.9–10.3)
Chloride: 99 mmol/L (ref 98–111)
Chloride: 101 mmol/L (ref 98–111)
Creatinine, Ser: 2.79 mg/dL — ABNORMAL HIGH (ref 0.44–1.00)
Creatinine, Ser: 2.26 mg/dL — ABNORMAL HIGH (ref 0.44–1.00)
GFR, Estimated: 18 mL/min — ABNORMAL LOW (ref >60)
GFR, Estimated: 23 mL/min — ABNORMAL LOW (ref >60)
Glucose, Bld: 155 mg/dL — ABNORMAL HIGH (ref 70–99)
Glucose, Bld: 137 mg/dL — ABNORMAL HIGH (ref 70–99)
Potassium: 3.3 mmol/L — ABNORMAL LOW (ref 3.5–5.1)
Potassium: 4 mmol/L (ref 3.5–5.1)
Sodium: 135 mmol/L (ref 135–145)
Sodium: 138 mmol/L (ref 135–145)

## 2021-01-25 LAB — CBC
HCT: 35.3 % — ABNORMAL LOW (ref 36.0–46.0)
Hemoglobin: 12.4 g/dL (ref 12.0–15.0)
MCH: 29.2 pg (ref 26.0–34.0)
MCHC: 35.1 g/dL (ref 30.0–36.0)
MCV: 83.3 fL (ref 80.0–100.0)
Platelets: 212 10*3/uL (ref 150–400)
RBC: 4.24 MIL/uL (ref 3.87–5.11)
RDW: 13.7 % (ref 11.5–15.5)
WBC: 11 10*3/uL — ABNORMAL HIGH (ref 4.0–10.5)
nRBC: 0 % (ref 0.0–0.2)

## 2021-01-25 LAB — LACTIC ACID, PLASMA: Lactic Acid, Venous: 0.9 mmol/L (ref 0.5–1.9)

## 2021-01-25 MED ORDER — POTASSIUM CHLORIDE 10 MEQ/100ML IV SOLN
10.0000 meq | INTRAVENOUS | Status: AC
  Administered 2021-01-25 (×2): 10 meq via INTRAVENOUS
  Filled 2021-01-25 (×2): qty 100

## 2021-01-25 MED ORDER — POTASSIUM CHLORIDE 10 MEQ/100ML IV SOLN
10.0000 meq | INTRAVENOUS | Status: AC
  Administered 2021-01-25 (×4): 10 meq via INTRAVENOUS
  Filled 2021-01-25 (×4): qty 100

## 2021-01-25 MED ORDER — DIATRIZOATE MEGLUMINE & SODIUM 66-10 % PO SOLN
90.0000 mL | Freq: Once | ORAL | Status: AC
  Administered 2021-01-25: 90 mL via NASOGASTRIC
  Filled 2021-01-25: qty 90

## 2021-01-25 NOTE — Progress Notes (Signed)
Patient refused CPAP for the night  

## 2021-01-25 NOTE — Progress Notes (Signed)
PROGRESS NOTE    Sara [name]  RSW:[id number] DOB: October 15, 1956 DOA: 01/22/2021 PCP: [name], MD    Chief Complaint  Patient presents with  . Hypotension    Since Monday N/V and diarrhea. Hypotensive, 80/40 and HR at 100 at urgent care. Dizziness with movement.      Brief Narrative:  Sara [name] is a 65 y.o. female with medical history significant for CAD, mild ischemic cardiomyopathy, OSA on CPAP, seizures, chronic kidney disease stage IIIa, hypertension, and essential thrombocythemia, now presenting to the emergency department for evaluation of nausea, vomiting, and diarrhea.  Patient reports that she developed severe nausea and nonbloody vomiting about 1 week ago.   Over the past couple days, she has had progressive lethargy, seem to have some mild confusion, and this prompted family to bring her to urgent care today.  She was noted to have blood pressure of 72/55 at urgent care and sent to the ED.  Subjective:  She is seen this morning, she is sitting up in chair, on ng suction, denies pain, + flatus 2.7liter ng output Reports throat irritation from NG Reports feeling stronger No fever Cr improving   Assessment & Plan:   Principal Problem:   Acute renal failure superimposed on stage 3a chronic kidney disease (HCC) Active Problems:   Essential thrombocythemia (HCC)   Hypotension due to hypovolemia   Chronic diastolic CHF (congestive heart failure) (HCC)   Sleep apnea with use of continuous positive airway pressure (CPAP)   Seizures (HCC)   CAD s/p CABG    Hyponatremia   Ischemic cardiomyopathy   Hypokalemia  N/V/D/SBO --C. difficile testing negative, GI PCR panel negative - KUB showed SBO, ct showed high grade sbo -Ng placed, keep npo, continue IV hydration, antiemetics - general surgery consulted, will follow recommendation   AKI on CKD 3 a -Creatinine significant elevated at 6.02 , bun 83 n ER, cr on 3/29 was 1.93, cr on 3/24 was 1.33 -Likely  prerenal from dehydration and hypotension with continued use of ARB at home -Renal ultrasound no hydronephrosis -Urine specific gravity 1.01, rare bacteria, no RBC, no WBC, negative nitrite, no protein -Urine culture no growth -Foley placed initially to monitor urine output, renal function improving, discontinue Foley catheter -Continue hold Micardis -Continue IV hydration, renal dosing meds, cr improving slowly  Hyponatremia -Sodium 118 in the setting of hypovolemia -Continue normal saline, monitor BMP every 4 hours, rate of correction donot exceed 0.17mmol/l/hr -Sodium normalized today  Hypoglycemia -due to n.p.o. status -change IV fluids from normal saline to D5 normal saline with K  Hypokalemia, remain low, replace K per IV aggressively,  Mag 2.5  Chronic diastolic CHF -Present with dehydration -Continue hydration, monitor volume status  History of CAD status post CABG in 2018 Currently denies chest pain, she is n.p.o., hold statin, aspirin  Hypertension Present with hypotension Home blood pressure medication held initially, she received hydration, blood pressure normalized -schedule IV Lopressor 2.5mg  q8hrs with holding parameters due to n.p.o. status -Continue hold Micardis  History of essential thrombocythemia On anagrelide at home, currently npo Platelets wnl  History of seizure On [name] at home ,currently npo, change to iv vimpat Report has not had seizure for many years  OSA on CPAP Continue     Body mass index is 32.34 kg/m.Marland Kitchen      Unresulted Labs (From admission, onward)          Start     Ordered   01/26/21 0500  CBC with Differential/Platelet  Tomorrow morning,   R       Question:  Specimen collection method  Answer:  IV Team=IV Team collect   01/25/21 1643   01/24/21 1610  Basic metabolic panel  5A & 5P,   R      01/24/21 0835            DVT prophylaxis: heparin injection 5,000 Units Start: 01/22/21 2200   Code Status:full Family  Communication: husband and niece at bedside on 4/2 Disposition:   Status is: Inpatient  Dispo: The patient is from: home              Anticipated d/c is to: home              Anticipated d/c date is: >48hrs                Consultants:   General surgery  Procedures:   NG placement on 4/2 night  Antimicrobials:   Anti-infectives (From admission, onward)   None          Objective: Vitals:   01/24/21 1950 01/25/21 0501 01/25/21 0821 01/25/21 1546  BP: 121/62 123/66 (!) 119/55 127/66  Pulse: 98 91 91 99  Resp: 16 16 17 17   Temp: 97.8 F (36.6 C) 98 F (36.7 C) (!) 97.4 F (36.3 C) 98.3 F (36.8 C)  TempSrc: Oral Oral Oral Oral  SpO2: 97% 97% 98% 94%  Weight:      Height:        Intake/Output Summary (Last 24 hours) at 01/25/2021 1643 Last data filed at 01/25/2021 0317 Gross per 24 hour  Intake 1234 ml  Output 2700 ml  Net -1466 ml   Filed Weights   01/22/21 1553 01/22/21 2333  Weight: 80.3 kg 80.2 kg    Examination:  General exam: appear stronger, calm, NAD, + NG Respiratory system: Clear to auscultation. Respiratory effort normal. Cardiovascular system: S1 & S2 heard, RRR. + 3/6 precordial murmur , No pedal edema. Prior CABG Gastrointestinal system: ventral hernia softer today, nontender, not reducible ,+ bowel sounds heard. Central nervous system: Alert and oriented. No focal neurological deficits. Extremities: generalized weakness Skin: No rashes, lesions or ulcers Psychiatry: Judgement and insight appear normal. Mood & affect appropriate.     Data Reviewed: I have personally reviewed following labs and imaging studies  CBC: Recent Labs  Lab 01/19/21 1723 01/22/21 1745 01/23/21 0354 01/24/21 0239 01/25/21 0358  WBC 19.0* 8.1 5.9 8.0 11.0*  NEUTROABS  --  5.8  --   --   --   HGB 14.8 13.6 12.1 12.7 12.4  HCT 41.9 39.2 32.9* 35.5* 35.3*  MCV 83 84.3 81.8 81.6 83.3  PLT 338 246 284 226 960    Basic Metabolic Panel: Recent Labs  Lab  01/23/21 1713 01/23/21 2037 01/24/21 0100 01/24/21 0239 01/24/21 0904 01/24/21 1800 01/25/21 0358  NA 124*   < > 123* 126* 125* 130* 135  K 2.5*   < > 3.2* 3.1* 3.3* 3.5 3.3*  CL 88*   < > 90* 94* 94* 97* 99  CO2 19*   < > 18* 17* 20* 24 28  GLUCOSE 60*   < > 113* 113* 123* 148* 155*  BUN 87*   < > 83* 84* 75* 67* 63*  CREATININE 4.89*   < > 4.65* 4.48* 3.91* 3.19* 2.79*  CALCIUM 8.2*   < > 8.0* 8.0* 8.0* 8.6* 8.8*  MG 2.5*  --   --  2.5*  --   --   --    < > =  values in this interval not displayed.    GFR: Estimated Creatinine Clearance: 19.4 mL/min (A) (by C-G formula based on SCr of 2.79 mg/dL (H)).  Liver Function Tests: Recent Labs  Lab 01/22/21 1745  AST 25  ALT 22  ALKPHOS 67  BILITOT 1.0  PROT 6.3*  ALBUMIN 2.8*    CBG: Recent Labs  Lab 01/23/21 1717 01/23/21 1936  GLUCAP 59* 79     Recent Results (from the past 240 hour(s))  Novel Coronavirus, NAA ([facility])     Status: None   Collection Time: 01/19/21  4:37 PM   Specimen: Nasopharyngeal(NP) swabs in vial transport medium  Result Value Ref Range Status   SARS-CoV-2, NAA Not Detected Not Detected Final    Comment: This nucleic acid amplification test was developed and its performance characteristics determined by [employer]. Nucleic acid amplification tests include RT-PCR and TMA. This test has not been FDA cleared or approved. This test has been authorized by FDA under an Emergency Use Authorization (EUA). This test is only authorized for the duration of time the declaration that circumstances exist justifying the authorization of the emergency use of in vitro diagnostic tests for detection of SARS-CoV-2 virus and/or diagnosis of COVID-19 infection under section 564(b)(1) of the Act, 21 U.S.C. 811BJY-7(W) (1), unless the authorization is terminated or revoked sooner. When diagnostic testing is negative, the possibility of a false negative result should be considered in the context of a  patient's recent exposures and the presence of clinical signs and symptoms consistent with COVID-19. An individual without symptoms of COVID-19 and who is not shedding SARS-CoV-2 virus wo uld expect to have a negative (not detected) result in this assay.   SARS-COV-2, NAA 2 DAY TAT     Status: None   Collection Time: 01/19/21  4:37 PM  Result Value Ref Range Status   SARS-CoV-2, NAA 2 DAY TAT Performed  Final  Urine culture     Status: None   Collection Time: 01/22/21 10:02 AM   Specimen: Urine, Random  Result Value Ref Range Status   Specimen Description URINE, RANDOM  Final   Special Requests NONE  Final   Culture   Final    NO GROWTH Performed at [facility], [place] [address] [place], [place] [address]    Report Status 01/24/2021 FINAL  Final  Blood culture (routine x 2)     Status: None (Preliminary result)   Collection Time: 01/22/21  5:45 PM   Specimen: BLOOD  Result Value Ref Range Status   Specimen Description BLOOD SITE NOT SPECIFIED  Final   Special Requests   Final    BOTTLES DRAWN AEROBIC AND ANAEROBIC Blood Culture results may not be optimal due to an inadequate volume of blood received in culture bottles   Culture   Final    NO GROWTH 2 DAYS Performed at [facility], [place] [address] [place], [place] [address]    Report Status PENDING  Incomplete  Resp Panel by RT-PCR (Flu A&B, Covid) Nasopharyngeal Swab     Status: None   Collection Time: 01/22/21  5:51 PM   Specimen: Nasopharyngeal Swab; Nasopharyngeal(NP) swabs in vial transport medium  Result Value Ref Range Status   SARS Coronavirus 2 by RT PCR NEGATIVE NEGATIVE Final    Comment: (NOTE) SARS-CoV-2 target nucleic acids are NOT DETECTED.  The SARS-CoV-2 RNA is generally detectable in upper respiratory specimens during the acute phase of infection. The lowest concentration of SARS-CoV-2 viral copies this assay  can detect is 138 copies/mL. A negative result does not preclude SARS-Cov-2 infection  and should not be used as the sole basis for treatment or other patient management decisions. A negative result may occur with  improper specimen collection/handling, submission of specimen other than nasopharyngeal swab, presence of viral mutation(s) within the areas targeted by this assay, and inadequate number of viral copies(<138 copies/mL). A negative result must be combined with clinical observations, patient history, and epidemiological information. The expected result is Negative.  Fact Sheet for Patients:  EntrepreneurPulse.com.au  Fact Sheet for Healthcare Providers:  IncredibleEmployment.be  This test is no t yet approved or cleared by the [place] FDA and  has been authorized for detection and/or diagnosis of SARS-CoV-2 by FDA under an Emergency Use Authorization (EUA). This EUA will remain  in effect (meaning this test can be used) for the duration of the COVID-19 declaration under Section 564(b)(1) of the Act, 21 U.S.C.section 360bbb-3(b)(1), unless the authorization is terminated  or revoked sooner.       Influenza A by PCR NEGATIVE NEGATIVE Final   Influenza B by PCR NEGATIVE NEGATIVE Final    Comment: (NOTE) The Xpert Xpress SARS-CoV-2/FLU/RSV plus assay is intended as an aid in the diagnosis of influenza from Nasopharyngeal swab specimens and should not be used as a sole basis for treatment. Nasal washings and aspirates are unacceptable for Xpert Xpress SARS-CoV-2/FLU/RSV testing.  Fact Sheet for Patients: EntrepreneurPulse.com.au  Fact Sheet for Healthcare Providers: IncredibleEmployment.be  This test is not yet approved or cleared by the [place] FDA and has been authorized for detection and/or diagnosis of SARS-CoV-2 by FDA under an Emergency Use Authorization (EUA). This EUA will remain in effect (meaning this test can be used) for the duration of the COVID-19 declaration  under Section 564(b)(1) of the Act, 21 U.S.C. section 360bbb-3(b)(1), unless the authorization is terminated or revoked.  Performed at [facility], [place] [address] [place], [place] [address]   C Difficile Quick Screen w PCR reflex     Status: None   Collection Time: 01/22/21 11:15 PM   Specimen: STOOL  Result Value Ref Range Status   C Diff antigen NEGATIVE NEGATIVE Final   C Diff toxin NEGATIVE NEGATIVE Final   C Diff interpretation No C. difficile detected.  Final    Comment: Performed at [facility], [place] [address] [place], [place] [address]  Gastrointestinal Panel by PCR , Stool     Status: None   Collection Time: 01/22/21 11:15 PM   Specimen: STOOL  Result Value Ref Range Status   Campylobacter species NOT DETECTED NOT DETECTED Final   Plesimonas shigelloides NOT DETECTED NOT DETECTED Final   Salmonella species NOT DETECTED NOT DETECTED Final   Yersinia enterocolitica NOT DETECTED NOT DETECTED Final   Vibrio species NOT DETECTED NOT DETECTED Final   Vibrio cholerae NOT DETECTED NOT DETECTED Final   Enteroaggregative E coli (EAEC) NOT DETECTED NOT DETECTED Final   Enteropathogenic E coli (EPEC) NOT DETECTED NOT DETECTED Final   Enterotoxigenic E coli (ETEC) NOT DETECTED NOT DETECTED Final   Shiga like toxin producing E coli (STEC) NOT DETECTED NOT DETECTED Final   Shigella/Enteroinvasive E coli (EIEC) NOT DETECTED NOT DETECTED Final   Cryptosporidium NOT DETECTED NOT DETECTED Final   Cyclospora cayetanensis NOT DETECTED NOT DETECTED Final   Entamoeba histolytica NOT DETECTED NOT DETECTED Final   Giardia lamblia NOT DETECTED NOT DETECTED Final   Adenovirus F40/41 NOT DETECTED NOT DETECTED Final  Astrovirus NOT DETECTED NOT DETECTED Final   Norovirus GI/GII NOT DETECTED NOT DETECTED Final   Rotavirus A NOT DETECTED NOT DETECTED Final   Sapovirus (I, II, IV, and V) NOT DETECTED NOT DETECTED Final    Comment: Performed at [name], [place] [place], [place] [address]  Blood culture (routine x 2)     Status: None (Preliminary result)   Collection Time: 01/23/21 12:10 AM   Specimen: BLOOD RIGHT HAND  Result Value Ref Range Status   Specimen Description BLOOD RIGHT HAND  Final   Special Requests   Final    AEROBIC BOTTLE ONLY Blood Culture results may not be optimal due to an inadequate volume of blood received in culture bottles   Culture   Final    NO GROWTH 1 DAY Performed at [facility], [place] [address] [place], [place] [address]    Report Status PENDING  Incomplete         Radiology Studies: CT ABDOMEN PELVIS WO CONTRAST  Addendum Date: 01/23/2021   ADDENDUM REPORT: 01/23/2021 23:55 ADDENDUM: These results were called by telephone at the time of interpretation on 01/23/2021 at 11:55 pm to provider PAUL STECHSCHULTE , who verbally acknowledged these results. Electronically Signed   By: Iven Finn M.D.   On: 01/23/2021 23:55   Result Date: 01/23/2021 CLINICAL DATA:  Bowel obstruction suspected. Nausea. Abdominal pain. EXAM: CT ABDOMEN AND PELVIS WITHOUT CONTRAST TECHNIQUE: Multidetector CT imaging of the abdomen and pelvis was performed following the standard protocol without IV contrast. COMPARISON:  X-ray abdomen 01/23/2021, ultrasound renal 01/23/2021, CT abdomen pelvis 12/30/2015 FINDINGS: Lower chest: Bibasilar linear atelectasis. Right lower lobe bronchial wall thickening. Coronary artery calcifications. Hepatobiliary: No focal liver abnormality. No gallstones, gallbladder wall thickening, or pericholecystic fluid. No biliary dilatation. Pancreas: No focal lesion. Normal pancreatic contour. No surrounding inflammatory changes. No main pancreatic ductal dilatation. Spleen: Normal in size without focal abnormality. Adrenals/Urinary Tract: No adrenal nodule bilaterally. No nephrolithiasis, no hydronephrosis, and no contour-deforming renal mass. No ureterolithiasis or hydroureter. The urinary bladder is decompressed and  grossly unremarkable a Foley catheter is noted to terminate within the urinary bladder lumen. Stomach/Bowel: The stomach is distended with fluid. Air-fluid level is noted within the gastric lumen. Layering hyperdensity within the gastric lumen likely related to ingested medication. The majority of the small bowel is dilated with fluid with associated air-fluid levels. Suggestion of a transition point within the lower mid abdomen (3:70, 4:46). The ascending colon is distended with fluid with findings suggestive of pneumatosis. Appendix appears normal. Vascular/Lymphatic: No abdominal aorta or iliac aneurysm. At least moderate atherosclerotic plaque of the aorta and its branches. No abdominal, pelvic, or inguinal lymphadenopathy. Reproductive: Status post hysterectomy. No adnexal masses. Other: No intraperitoneal free fluid. No intraperitoneal free gas. No organized fluid collection. Musculoskeletal: Moderate volume supraumbilical ventral wall hernia with an abdominal wall defect of 3.3 cm containing the mid transverse colon. Tiny fat containing paraumbilical hernia. Tiny fat containing left inguinal hernia. No suspicious lytic or blastic osseous lesions. No acute displaced fracture. Multilevel mild degenerative changes of the spine. IMPRESSION: 1. High-grade small-bowel obstruction within a transition point within the lower mid abdomen. 2. Fluid distended ascending colon and proximal transverse colon with a transition point at the entry site of the mid transverse colon into a supraumbilical hernia. Suggestion of pneumatosis of the ascending colon concerning for bowel ischemia. Please note limited evaluation for bowel ischemia on this noncontrast study. Recommend emergent surgical consultation. 3. Tiny  fat containing periumbilical hernia and left inguinal hernia. 4.  Aortic Atherosclerosis (ICD10-I70.0). Electronically Signed: By: Iven Finn M.D. On: 01/23/2021 23:50   DG Abd Portable 1V-Small Bowel Obstruction  Protocol-initial, 8 hr delay  Result Date: 01/25/2021 CLINICAL DATA:  Small bowel obstruction EXAM: PORTABLE ABDOMEN - 1 VIEW COMPARISON:  01/24/2021 FINDINGS: Interval reduction in number and distension of gas-filled loops of small bowel in the central abdomen, largest loops measuring approximately 5.1 cm, previously 5.7 cm. Esophagogastric tube remains with tip and side port below the diaphragm, tip in the vicinity of the pylorus or duodenal bulb. No free air in the abdomen on supine radiographs. IMPRESSION: Interval reduction in number and distension of gas-filled loops of small bowel in the central abdomen, largest loops measuring approximately 5.1 cm, previously 5.7 cm. Esophagogastric tube remains with tip and side port below the diaphragm, tip in the vicinity of the pylorus or duodenal bulb. No free air in the abdomen on supine radiograph. Electronically Signed   By: Eddie Candle M.D.   On: 01/25/2021 13:34   DG Abd Portable 1V  Result Date: 01/24/2021 CLINICAL DATA:  NG tube placement EXAM: PORTABLE ABDOMEN - 1 VIEW COMPARISON:  01/23/2021 FINDINGS: An enteric tube has been placed with tip in the right upper quadrant consistent with location in the distal stomach. Gas-filled dilated small bowel consistent with small bowel obstruction. IMPRESSION: Enteric tube tip is in the right upper quadrant consistent with location in the distal stomach. Electronically Signed   By: Lucienne Capers M.D.   On: 01/24/2021 02:18        Scheduled Meds: . Chlorhexidine Gluconate Cloth  6 each Topical Daily  . heparin  5,000 Units Subcutaneous Q8H  . metoprolol tartrate  2.5 mg Intravenous Q8H   Continuous Infusions: . dextrose 5 % and 0.9 % NaCl with KCl 20 mEq/L 75 mL/hr at 01/25/21 1228  . lacosamide ([name]) IV 100 mg (01/25/21 0630)  . lacosamide ([name]) IV 150 mg (01/24/21 1749)     LOS: 3 days   Time spent: 71mins Greater than 50% of this time was spent in counseling, explanation of diagnosis,  planning of further management, and coordination of care.   Voice Recognition Viviann Spare dictation system was used to create this note, attempts have been made to correct errors. Please contact the author with questions and/or clarifications.   Florencia Reasons, MD PhD FACP [facility]  Available via Epic secure chat 7am-7pm for nonurgent issues Please page for urgent issues To page the attending provider between 7A-7P or the covering provider during after hours 7P-7A, please log into the web site [email or web address] and access using universal [place] password for that web site. If you do not have the password, please call the hospital operator.    01/25/2021, 4:43 PM

## 2021-01-25 NOTE — Plan of Care (Signed)

## 2021-01-25 NOTE — Progress Notes (Signed)
Physical Therapy Treatment Patient Details Name: Sara [name] MRN: [id number] DOB: 03-13-1956 Today's Date: 01/25/2021    History of Present Illness Pt adm 4/1 with nausea, vomiting, and diarrhea. Pt found to have acute renal failure superimposed on ckd. x-ray showed possible SBO and surgery consulted. Surgery found epigastric hernia with colon in sac and proximal dilation and dilation of small intestine. Surgery followind for enteritis vs intermittent obstruction.  PMH - DM, gout, HTN, obesity, PAD, seizures, ckd, OSA, CAD, CABG    PT Comments    Pt progressing towards goals. Requiring min guard A for mobility tasks using RW. Educated about seated HEP and generalized walking program for home. Current recommendations appropriate. Will continue to follow acutely.     Follow Up Recommendations  No PT follow up     Equipment Recommendations  None recommended by PT    Recommendations for Other Services       Precautions / Restrictions Precautions Precautions: Other (comment) Precaution Comments: NG tube Restrictions Weight Bearing Restrictions: No    Mobility  Bed Mobility               General bed mobility comments: In recliner upon entry.    Transfers Overall transfer level: Needs assistance Equipment used: None;Rolling walker (2 wheeled) Transfers: Sit to/from Stand Sit to Stand: Min guard         General transfer comment: Min guard for safety.  Ambulation/Gait Ambulation/Gait assistance: Min guard Gait Distance (Feet): 150 Feet Assistive device: Rolling walker (2 wheeled) Gait Pattern/deviations: Step-through pattern;Decreased stride length Gait velocity: decreased   General Gait Details: Min guard A for safety. Guarded gait, however, no overt LOB noted. Educated about walking program to perform at home.   Stairs             Wheelchair Mobility    Modified Rankin (Stroke Patients Only)       Balance Overall balance assessment: Mild  deficits observed, not formally tested                                          Cognition Arousal/Alertness: Awake/alert Behavior During Therapy: WFL for tasks assessed/performed Overall Cognitive Status: Within Functional Limits for tasks assessed                                        Exercises General Exercises - Lower Extremity Long Arc Quad: AROM;Both;20 reps;Seated Hip Flexion/Marching: AROM;Both;20 reps;Seated    General Comments        Pertinent Vitals/Pain Pain Assessment: Faces Faces Pain Scale: Hurts little more Pain Location: abdomen Pain Descriptors / Indicators: Aching Pain Intervention(s): Limited activity within patient's tolerance;Monitored during session;Repositioned    Home Living                      Prior Function            PT Goals (current goals can now be found in the care plan section) Acute Rehab PT Goals Patient Stated Goal: return home PT Goal Formulation: With patient Time For Goal Achievement: 01/31/21 Potential to Achieve Goals: Good Progress towards PT goals: Progressing toward goals    Frequency    Min 3X/week      PT Plan Current plan remains appropriate    Co-evaluation  AM-PAC PT "6 Clicks" Mobility   Outcome Measure  Help needed turning from your back to your side while in a flat bed without using bedrails?: None Help needed moving from lying on your back to sitting on the side of a flat bed without using bedrails?: A Little Help needed moving to and from a bed to a chair (including a wheelchair)?: A Little Help needed standing up from a chair using your arms (e.g., wheelchair or bedside chair)?: A Little Help needed to walk in hospital room?: A Little Help needed climbing 3-5 steps with a railing? : A Little 6 Click Score: 19    End of Session Equipment Utilized During Treatment: Gait belt Activity Tolerance: Patient tolerated treatment well Patient  left: in chair;with call bell/phone within reach Nurse Communication: Mobility status PT Visit Diagnosis: Other abnormalities of gait and mobility (R26.89)     Time: 2951-8841 PT Time Calculation (min) (ACUTE ONLY): 14 min  Charges:  $Gait Training: 8-22 mins                     Lou Miner, DPT  Acute Rehabilitation Services  Pager: [phone] Office: [phone]    Rudean Hitt 01/25/2021, 3:13 PM

## 2021-01-25 NOTE — Progress Notes (Signed)
TRH night shift  The staff reports that the patient's potassium level was 3.3 mmol/L, which is the same level that he was yesterday morning despite aggressive replenishment.  She is currently on D5 NS with KCl 20 mEq at 75 mL/h as well.  KCl 10 mEq every hour x2 doses ordered.  Tennis Must, MD.

## 2021-01-25 NOTE — Progress Notes (Signed)
Progress Note: General Surgery Service   Chief Complaint/Subjective: Denies pain. Endorses flatus but no BM last 24h. Confirms PMH c-section x2, and hysterectomy via pfannensteil incision. Husband at bedside.  Objective: Vital signs in last 24 hours: Temp:  [97.4 F (36.3 C)-98 F (36.7 C)] 97.4 F (36.3 C) (04/04 0821) Pulse Rate:  [91-107] 91 (04/04 0821) Resp:  [16-17] 17 (04/04 0821) BP: (114-123)/(55-66) 119/55 (04/04 0821) SpO2:  [97 %-98 %] 98 % (04/04 0821) Last BM Date: 01/24/21  Intake/Output from previous day: 04/03 0701 - 04/04 0700 In: 1234 [I.V.:1234] Out: 3700 [Urine:1000; Emesis/NG output:2700] Intake/Output this shift: No intake/output data recorded.  Gen: NAD  Resp: nonlabored  Card: tachycardic  Abd: soft, epigastric hernia soft with bowel sounds on palpation, NG with significant bilious output (2700 cc/24h)  Lab Results: CBC  Recent Labs    01/24/21 0239 01/25/21 0358  WBC 8.0 11.0*  HGB 12.7 12.4  HCT 35.5* 35.3*  PLT 226 212   BMET Recent Labs    01/24/21 1800 01/25/21 0358  NA 130* 135  K 3.5 3.3*  CL 97* 99  CO2 24 28  GLUCOSE 148* 155*  BUN 67* 63*  CREATININE 3.19* 2.79*  CALCIUM 8.6* 8.8*   PT/INR No results for input(s): LABPROT, INR in the last 72 hours. ABG No results for input(s): PHART, HCO3 in the last 72 hours.  Invalid input(s): PCO2, PO2  Anti-infectives: Anti-infectives (From admission, onward)   None      Medications: Scheduled Meds: . Chlorhexidine Gluconate Cloth  6 each Topical Daily  . diatrizoate meglumine-sodium  90 mL Per NG tube Once  . heparin  5,000 Units Subcutaneous Q8H  . metoprolol tartrate  2.5 mg Intravenous Q8H   Continuous Infusions: . dextrose 5 % and 0.9 % NaCl with KCl 20 mEq/L 75 mL/hr at 01/24/21 2149  . lacosamide ([name]) IV 100 mg (01/25/21 0383)  . lacosamide ([name]) IV 150 mg (01/24/21 1749)  . potassium chloride 10 mEq (01/25/21 0929)   PRN Meds:.acetaminophen **OR**  acetaminophen, ondansetron **OR** ondansetron (ZOFRAN) IV, phenol, sodium chloride flush  Assessment/Plan: 65 yo female with epigastric hernia with colon in sac and proximal dilation and dilation of small intestine. Having bowel movements also has sick contact for enteritis, GI panel negative. History of c-section x2 and hysterectomy.  - hernia is reducible, non-contrasted CT scan suggests possible colonic pneumatosis however patient is hemodynamically stable with a normal lactate and non-tender abdominal exam, low suspicion for true bowel ischemia.  - suspected pSBO related to enteritis vs adhesive disease - continue non-operative management with NG tube decompression. Small bowel protocol with gastrografin today. Failure to improve in the next 24-48 hours may warrant surgery.   LOS: 3 days   Jill Alexanders, PA-C [facility]

## 2021-01-26 ENCOUNTER — Inpatient Hospital Stay (HOSPITAL_COMMUNITY): Payer: Medicare Other

## 2021-01-26 LAB — CBC WITH DIFFERENTIAL/PLATELET
Abs Immature Granulocytes: 1.3 10*3/uL — ABNORMAL HIGH (ref 0.00–0.07)
Basophils Absolute: 0 10*3/uL (ref 0.0–0.1)
Basophils Relative: 0 %
Eosinophils Absolute: 0.6 10*3/uL — ABNORMAL HIGH (ref 0.0–0.5)
Eosinophils Relative: 4 %
HCT: 36.7 % (ref 36.0–46.0)
Hemoglobin: 12.6 g/dL (ref 12.0–15.0)
Lymphocytes Relative: 10 %
Lymphs Abs: 1.6 10*3/uL (ref 0.7–4.0)
MCH: 28.7 pg (ref 26.0–34.0)
MCHC: 34.3 g/dL (ref 30.0–36.0)
MCV: 83.6 fL (ref 80.0–100.0)
Metamyelocytes Relative: 3 %
Monocytes Absolute: 2.7 10*3/uL — ABNORMAL HIGH (ref 0.1–1.0)
Monocytes Relative: 17 %
Myelocytes: 5 %
Neutro Abs: 9.7 10*3/uL — ABNORMAL HIGH (ref 1.7–7.7)
Neutrophils Relative %: 61 %
Platelets: 202 10*3/uL (ref 150–400)
RBC: 4.39 MIL/uL (ref 3.87–5.11)
RDW: 14 % (ref 11.5–15.5)
WBC: 15.9 10*3/uL — ABNORMAL HIGH (ref 4.0–10.5)
nRBC: 0 % (ref 0.0–0.2)
nRBC: 0 /100 WBC

## 2021-01-26 LAB — BASIC METABOLIC PANEL
Anion gap: 7 (ref 5–15)
BUN: 46 mg/dL — ABNORMAL HIGH (ref 8–23)
CO2: 31 mmol/L (ref 22–32)
Calcium: 9.1 mg/dL (ref 8.9–10.3)
Chloride: 106 mmol/L (ref 98–111)
Creatinine, Ser: 2.07 mg/dL — ABNORMAL HIGH (ref 0.44–1.00)
GFR, Estimated: 26 mL/min — ABNORMAL LOW (ref >60)
Glucose, Bld: 135 mg/dL — ABNORMAL HIGH (ref 70–99)
Potassium: 3.8 mmol/L (ref 3.5–5.1)
Sodium: 144 mmol/L (ref 135–145)

## 2021-01-26 NOTE — Progress Notes (Signed)
PROGRESS NOTE    Sara [name]  QBH:[id number] DOB: [birth year] DOA: 01/22/2021 PCP: [name], MD   Chief Complain: Nausea, vomiting  Brief Narrative: Patient is a 65 year old female with history of coronary disease, ischemic cardiomyopathy, OSA on CPAP, seizure disorder, chronic renal disease stage III, hypertension who presented for the evaluation of nausea, vomiting and diarrhea.  Patient was noted to be progressively lethargic couple of days before admission.she was found to be hypotensive at urgent care and was sent to the emergency department.  KUB showed SBO.  CT imaging did confirm high-grade SBO.  Lab work showed severe AKI.  Started on conservative management.  General surgery following.  Assessment & Plan:   Principal Problem:   Acute renal failure superimposed on stage 3a chronic kidney disease (HCC) Active Problems:   Essential thrombocythemia (HCC)   Hypotension due to hypovolemia   Chronic diastolic CHF (congestive heart failure) (HCC)   Sleep apnea with use of continuous positive airway pressure (CPAP)   Seizures (HCC)   CAD s/p CABG    Hyponatremia   Ischemic cardiomyopathy   Hypokalemia  SBO: General surgery following.  Continue conservative management.  Abdominal x-ray done today showed resolving SBO.  Denies any abdominal pain today  Severe AKI on CKD stage IIIa: Creatinine elevated at 6 on presentation.  Baseline creatinine around 1.3.  Likely prerenal from dehydration.  Kidney function improved with IV fluids.  Renal ultrasound did not show any hydronephrosis.  Urine culture did not show any growth.  Foley catheter will be  discontinued.  On Micardis at home which has been held.  Monitor kidney function  Nausea/vomiting/diarrhea: Continue gentle IV fluids, antiemetics.  The symptoms have resolved.  C. difficile/GI pathogen panel negative.  Leukocytosis: Most likely reactive.  Low suspicion for infectious etiology.  Continue to monitor.  Check CBC  tomorrow  Hyponatremia: Present on admission.  Resolved  Hypoglycemic episode: Continue monitor blood glucose, continue D5 along with normal saline.  Hypokalemia: Supplemented  History of chronic diastolic congestive heart failure: Presented with dehydration.  Continue IV fluids for now.  History of coronary artery disease: Status post CABG in 2018.  On aspirin, statin at home.  History of essential thrombocythemia: On anagrelide at home ,currently platelets normal  History of seizure: Takes [name] at home.  On IV [name] at present.  OSA: On CPAP at home.  History of hypertension: She will hypotensive on presentation.  Currently blood pressure stable.  On Micardis at home which is on hold.             DVT prophylaxis: Heparin [place] Code Status: Full Family Communication: None at bedside Status is: Inpatient  Remains inpatient appropriate because:Inpatient level of care appropriate due to severity of illness   Dispo: The patient is from: Home              Anticipated d/c is to: Home              Patient currently is not medically stable to d/c.   Difficult to place patient No    Consultants: Surgery  Procedures:None  Antimicrobials:  Anti-infectives (From admission, onward)   None      Subjective: Patient seen and examined the bedside this afternoon.  Hemodynamically stable.  Comfortable but denies any abdomen pain, nausea or vomiting.  Has been having bowel movements.  Has good bowel sounds  Objective: Vitals:   01/25/21 0821 01/25/21 1546 01/25/21 2026 01/26/21 0747  BP: (!) 119/55 127/66 124/63 (!) 154/98  Pulse: 91 99 (!) 106 93  Resp: 17 17 16 17   Temp: (!) 97.4 F (36.3 C) 98.3 F (36.8 C) 98.7 F (37.1 C) 97.6 F (36.4 C)  TempSrc: Oral Oral Oral Oral  SpO2: 98% 94% 97% 96%  Weight:      Height:        Intake/Output Summary (Last 24 hours) at 01/26/2021 1343 Last data filed at 01/26/2021 0900 Gross per 24 hour  Intake 1000 ml  Output 2500  ml  Net -1500 ml   Filed Weights   01/22/21 1553 01/22/21 2333  Weight: 80.3 kg 80.2 kg    Examination:  General exam: Appears calm and comfortable ,Not in distress,average built HEENT:PERRL,Oral mucosa moist, Ear/Nose normal on gross exam.NG tube Respiratory system: Bilateral equal air entry, normal vesicular breath sounds, no wheezes or crackles  Cardiovascular system: S1 & S2 heard, RRR. No JVD, murmurs, rubs, gallops or clicks. No pedal edema. Gastrointestinal system: Abdomen is nondistended, soft and nontender. No organomegaly or masses felt. Normal bowel sounds heard. Central nervous system: Alert and oriented. No focal neurological deficits. Extremities: No edema, no clubbing ,no cyanosis Skin: No rashes, lesions or ulcers,no icterus ,no pallor   Data Reviewed: I have personally reviewed following labs and imaging studies  CBC: Recent Labs  Lab 01/22/21 1745 01/23/21 0354 01/24/21 0239 01/25/21 0358 01/26/21 0342  WBC 8.1 5.9 8.0 11.0* 15.9*  NEUTROABS 5.8  --   --   --  9.7*  HGB 13.6 12.1 12.7 12.4 12.6  HCT 39.2 32.9* 35.5* 35.3* 36.7  MCV 84.3 81.8 81.6 83.3 83.6  PLT 246 284 226 212 962   Basic Metabolic Panel: Recent Labs  Lab 01/23/21 1713 01/23/21 2037 01/24/21 0239 01/24/21 0904 01/24/21 1800 01/25/21 0358 01/25/21 2039 01/26/21 0342  NA 124*   < > 126* 125* 130* 135 138 144  K 2.5*   < > 3.1* 3.3* 3.5 3.3* 4.0 3.8  CL 88*   < > 94* 94* 97* 99 101 106  CO2 19*   < > 17* 20* 24 28 29 31   GLUCOSE 60*   < > 113* 123* 148* 155* 137* 135*  BUN 87*   < > 84* 75* 67* 63* 51* 46*  CREATININE 4.89*   < > 4.48* 3.91* 3.19* 2.79* 2.26* 2.07*  CALCIUM 8.2*   < > 8.0* 8.0* 8.6* 8.8* 9.1 9.1  MG 2.5*  --  2.5*  --   --   --   --   --    < > = values in this interval not displayed.   GFR: Estimated Creatinine Clearance: 26.2 mL/min (A) (by C-G formula based on SCr of 2.07 mg/dL (H)). Liver Function Tests: Recent Labs  Lab 01/22/21 1745  AST 25  ALT  22  ALKPHOS 67  BILITOT 1.0  PROT 6.3*  ALBUMIN 2.8*   Recent Labs  Lab 01/22/21 1745  LIPASE 24   No results for input(s): AMMONIA in the last 168 hours. Coagulation Profile: No results for input(s): INR, PROTIME in the last 168 hours. Cardiac Enzymes: No results for input(s): CKTOTAL, CKMB, CKMBINDEX, TROPONINI in the last 168 hours. BNP (last 3 results) No results for input(s): PROBNP in the last 8760 hours. HbA1C: No results for input(s): HGBA1C in the last 72 hours. CBG: Recent Labs  Lab 01/23/21 1717 01/23/21 1936  GLUCAP 59* 79   Lipid Profile: No results for input(s): CHOL, HDL, LDLCALC, TRIG, CHOLHDL, LDLDIRECT in the last 72 hours. Thyroid Function  Tests: No results for input(s): TSH, T4TOTAL, FREET4, T3FREE, THYROIDAB in the last 72 hours. Anemia Panel: No results for input(s): VITAMINB12, FOLATE, FERRITIN, TIBC, IRON, RETICCTPCT in the last 72 hours. Sepsis Labs: Recent Labs  Lab 01/24/21 0026 01/24/21 0239 01/25/21 0358  LATICACIDVEN 0.8 0.8 0.9    Recent Results (from the past 240 hour(s))  Novel Coronavirus, NAA ([facility])     Status: None   Collection Time: 01/19/21  4:37 PM   Specimen: Nasopharyngeal(NP) swabs in vial transport medium  Result Value Ref Range Status   SARS-CoV-2, NAA Not Detected Not Detected Final    Comment: This nucleic acid amplification test was developed and its performance characteristics determined by [employer]. Nucleic acid amplification tests include RT-PCR and TMA. This test has not been FDA cleared or approved. This test has been authorized by FDA under an Emergency Use Authorization (EUA). This test is only authorized for the duration of time the declaration that circumstances exist justifying the authorization of the emergency use of in vitro diagnostic tests for detection of SARS-CoV-2 virus and/or diagnosis of COVID-19 infection under section 564(b)(1) of the Act, 21 U.S.C. 756EPP-2(R) (1), unless the  authorization is terminated or revoked sooner. When diagnostic testing is negative, the possibility of a false negative result should be considered in the context of a patient's recent exposures and the presence of clinical signs and symptoms consistent with COVID-19. An individual without symptoms of COVID-19 and who is not shedding SARS-CoV-2 virus wo uld expect to have a negative (not detected) result in this assay.   SARS-COV-2, NAA 2 DAY TAT     Status: None   Collection Time: 01/19/21  4:37 PM  Result Value Ref Range Status   SARS-CoV-2, NAA 2 DAY TAT Performed  Final  Urine culture     Status: None   Collection Time: 01/22/21 10:02 AM   Specimen: Urine, Random  Result Value Ref Range Status   Specimen Description URINE, RANDOM  Final   Special Requests NONE  Final   Culture   Final    NO GROWTH Performed at [facility], [place] [address] [place], [place] [address]    Report Status 01/24/2021 FINAL  Final  Blood culture (routine x 2)     Status: None (Preliminary result)   Collection Time: 01/22/21  5:45 PM   Specimen: BLOOD  Result Value Ref Range Status   Specimen Description BLOOD SITE NOT SPECIFIED  Final   Special Requests   Final    BOTTLES DRAWN AEROBIC AND ANAEROBIC Blood Culture results may not be optimal due to an inadequate volume of blood received in culture bottles   Culture   Final    NO GROWTH 4 DAYS Performed at [facility], [place] [address], [place], [place] [address]    Report Status PENDING  Incomplete  Resp Panel by RT-PCR (Flu A&B, Covid) Nasopharyngeal Swab     Status: None   Collection Time: 01/22/21  5:51 PM   Specimen: Nasopharyngeal Swab; Nasopharyngeal(NP) swabs in vial transport medium  Result Value Ref Range Status   SARS Coronavirus 2 by RT PCR NEGATIVE NEGATIVE Final    Comment: (NOTE) SARS-CoV-2 target nucleic acids are NOT DETECTED.  The SARS-CoV-2 RNA is generally detectable in upper respiratory specimens during the acute  phase of infection. The lowest concentration of SARS-CoV-2 viral copies this assay can detect is 138 copies/mL. A negative result does not preclude SARS-Cov-2 infection and should not be used as the sole  basis for treatment or other patient management decisions. A negative result may occur with  improper specimen collection/handling, submission of specimen other than nasopharyngeal swab, presence of viral mutation(s) within the areas targeted by this assay, and inadequate number of viral copies(<138 copies/mL). A negative result must be combined with clinical observations, patient history, and epidemiological information. The expected result is Negative.  Fact Sheet for Patients:  EntrepreneurPulse.com.au  Fact Sheet for Healthcare Providers:  IncredibleEmployment.be  This test is no t yet approved or cleared by the [place] FDA and  has been authorized for detection and/or diagnosis of SARS-CoV-2 by FDA under an Emergency Use Authorization (EUA). This EUA will remain  in effect (meaning this test can be used) for the duration of the COVID-19 declaration under Section 564(b)(1) of the Act, 21 U.S.C.section 360bbb-3(b)(1), unless the authorization is terminated  or revoked sooner.       Influenza A by PCR NEGATIVE NEGATIVE Final   Influenza B by PCR NEGATIVE NEGATIVE Final    Comment: (NOTE) The Xpert Xpress SARS-CoV-2/FLU/RSV plus assay is intended as an aid in the diagnosis of influenza from Nasopharyngeal swab specimens and should not be used as a sole basis for treatment. Nasal washings and aspirates are unacceptable for Xpert Xpress SARS-CoV-2/FLU/RSV testing.  Fact Sheet for Patients: EntrepreneurPulse.com.au  Fact Sheet for Healthcare Providers: IncredibleEmployment.be  This test is not yet approved or cleared by the [place] FDA and has been authorized for detection and/or diagnosis of  SARS-CoV-2 by FDA under an Emergency Use Authorization (EUA). This EUA will remain in effect (meaning this test can be used) for the duration of the COVID-19 declaration under Section 564(b)(1) of the Act, 21 U.S.C. section 360bbb-3(b)(1), unless the authorization is terminated or revoked.  Performed at [facility], [place] [address], [place], [place] [address]   C Difficile Quick Screen w PCR reflex     Status: None   Collection Time: 01/22/21 11:15 PM   Specimen: STOOL  Result Value Ref Range Status   C Diff antigen NEGATIVE NEGATIVE Final   C Diff toxin NEGATIVE NEGATIVE Final   C Diff interpretation No C. difficile detected.  Final    Comment: Performed at [facility], [place] [address], [place], [place] [address]  Gastrointestinal Panel by PCR , Stool     Status: None   Collection Time: 01/22/21 11:15 PM   Specimen: STOOL  Result Value Ref Range Status   Campylobacter species NOT DETECTED NOT DETECTED Final   Plesimonas shigelloides NOT DETECTED NOT DETECTED Final   Salmonella species NOT DETECTED NOT DETECTED Final   Yersinia enterocolitica NOT DETECTED NOT DETECTED Final   Vibrio species NOT DETECTED NOT DETECTED Final   Vibrio cholerae NOT DETECTED NOT DETECTED Final   Enteroaggregative E coli (EAEC) NOT DETECTED NOT DETECTED Final   Enteropathogenic E coli (EPEC) NOT DETECTED NOT DETECTED Final   Enterotoxigenic E coli (ETEC) NOT DETECTED NOT DETECTED Final   Shiga like toxin producing E coli (STEC) NOT DETECTED NOT DETECTED Final   Shigella/Enteroinvasive E coli (EIEC) NOT DETECTED NOT DETECTED Final   Cryptosporidium NOT DETECTED NOT DETECTED Final   Cyclospora cayetanensis NOT DETECTED NOT DETECTED Final   Entamoeba histolytica NOT DETECTED NOT DETECTED Final   Giardia lamblia NOT DETECTED NOT DETECTED Final   Adenovirus F40/41 NOT DETECTED NOT DETECTED Final   Astrovirus NOT DETECTED NOT DETECTED Final   Norovirus GI/GII NOT DETECTED NOT DETECTED Final    Rotavirus A NOT DETECTED  NOT DETECTED Final   Sapovirus (I, II, IV, and V) NOT DETECTED NOT DETECTED Final    Comment: Performed at [facility], [address] [place], [place] [address]  Blood culture (routine x 2)     Status: None (Preliminary result)   Collection Time: 01/23/21 12:10 AM   Specimen: BLOOD RIGHT HAND  Result Value Ref Range Status   Specimen Description BLOOD RIGHT HAND  Final   Special Requests   Final    AEROBIC BOTTLE ONLY Blood Culture results may not be optimal due to an inadequate volume of blood received in culture bottles   Culture   Final    NO GROWTH 3 DAYS Performed at [facility], [place] [address] [place], [place] [address]    Report Status PENDING  Incomplete         Radiology Studies: DG Abd 1 View  Result Date: 01/26/2021 CLINICAL DATA:  Small-bowel obstruction EXAM: ABDOMEN - 1 VIEW COMPARISON:  Abdominal radiograph yesterday FINDINGS: Gastric tube remains in good position with the tip overlying the pre-pyloric gastric antrum. Markedly reduced prominence of small bowel gas pattern. Oral contrast material now noted throughout the colon. No large free air on this single supine series. No acute osseous abnormality. IMPRESSION: 1. Resolving small bowel obstruction. 2. Oral contrast material is now noted throughout the colon. 3. Well-positioned gastrostomy tube. Electronically Signed   By: Jacqulynn Cadet M.D.   On: 01/26/2021 10:50   DG Abd Portable 1V-Small Bowel Obstruction Protocol-initial, 8 hr delay  Result Date: 01/25/2021 CLINICAL DATA:  Small bowel obstruction EXAM: PORTABLE ABDOMEN - 1 VIEW COMPARISON:  01/24/2021 FINDINGS: Interval reduction in number and distension of gas-filled loops of small bowel in the central abdomen, largest loops measuring approximately 5.1 cm, previously 5.7 cm. Esophagogastric tube remains with tip and side port below the diaphragm, tip in the vicinity of the pylorus or duodenal bulb. No free air in the  abdomen on supine radiographs. IMPRESSION: Interval reduction in number and distension of gas-filled loops of small bowel in the central abdomen, largest loops measuring approximately 5.1 cm, previously 5.7 cm. Esophagogastric tube remains with tip and side port below the diaphragm, tip in the vicinity of the pylorus or duodenal bulb. No free air in the abdomen on supine radiograph. Electronically Signed   By: Eddie Candle M.D.   On: 01/25/2021 13:34        Scheduled Meds: . Chlorhexidine Gluconate Cloth  6 each Topical Daily  . heparin  5,000 Units Subcutaneous Q8H  . metoprolol tartrate  2.5 mg Intravenous Q8H   Continuous Infusions: . dextrose 5 % and 0.9 % NaCl with KCl 20 mEq/L 75 mL/hr at 01/25/21 1228  . lacosamide ([name]) IV 100 mg (01/26/21 0610)  . lacosamide ([name]) IV 150 mg (01/25/21 1831)     LOS: 4 days    Time spent: 25 mins.More than 50% of that time was spent in counseling and/or coordination of care.      Shelly Coss, MD [facility] P4/02/2021, 1:43 PM

## 2021-01-26 NOTE — Progress Notes (Signed)
Shift note:  Per MD, clamp NGT overnight and ok for pt to have clear liquids from the floor. Pt denies nausea, ABD pain, ABD is soft, nondistended, nontender throughout shift. Bowel sounds active. Pt has had a Sara [name] small, soft BMs today as charted. Has voided twice since foley catheter removed.

## 2021-01-26 NOTE — Progress Notes (Signed)
Progress Note     Subjective: Patient reports a good sized BM overnight, not passing any flatus. Denies abdominal pain. Mobilizing when able.   Objective: Vital signs in last 24 hours: Temp:  [97.6 F (36.4 C)-98.7 F (37.1 C)] 97.6 F (36.4 C) (04/05 0747) Pulse Rate:  [93-106] 93 (04/05 0747) Resp:  [16-17] 17 (04/05 0747) BP: (124-154)/(63-98) 154/98 (04/05 0747) SpO2:  [94 %-97 %] 96 % (04/05 0747) Last BM Date: 01/23/21  Intake/Output from previous day: 04/04 0701 - 04/05 0700 In: 1000 [I.V.:900; IV Piggyback:100] Out: 1900 [Emesis/NG output:1900] Intake/Output this shift: No intake/output data recorded.  PE: General: pleasant, WD, overweight female who is sitting in chair in NAD Heart: regular, rate, and rhythm.   Lungs: CTAB, no wheezes, rhonchi, or rales noted.  Respiratory effort nonlabored Abd: soft, NT, ND, BS hypoactive, NGT with bilious drainage, ventral hernia is soft and reducible  MS: all 4 extremities are symmetrical with no cyanosis, clubbing, or edema. Skin: warm and dry with no masses, lesions, or rashes Neuro: Cranial nerves 2-12 grossly intact, sensation is normal throughout Psych: A&Ox3 with an appropriate affect.    Lab Results:  Recent Labs    01/25/21 0358 01/26/21 0342  WBC 11.0* 15.9*  HGB 12.4 12.6  HCT 35.3* 36.7  PLT 212 202   BMET Recent Labs    01/25/21 2039 01/26/21 0342  NA 138 144  K 4.0 3.8  CL 101 106  CO2 29 31  GLUCOSE 137* 135*  BUN 51* 46*  CREATININE 2.26* 2.07*  CALCIUM 9.1 9.1   PT/INR No results for input(s): LABPROT, INR in the last 72 hours. CMP     Component Value Date/Time   NA 144 01/26/2021 0342   NA 125 (L) 01/19/2021 1723   NA 139 08/11/2017 1528   K 3.8 01/26/2021 0342   K 4.2 08/11/2017 1528   CL 106 01/26/2021 0342   CL 102 03/19/2013 1535   CO2 31 01/26/2021 0342   CO2 27 08/11/2017 1528   GLUCOSE 135 (H) 01/26/2021 0342   GLUCOSE 93 08/11/2017 1528   GLUCOSE 117 (H) 03/19/2013  1535   BUN 46 (H) 01/26/2021 0342   BUN 18 01/19/2021 1723   BUN 15.0 08/11/2017 1528   CREATININE 2.07 (H) 01/26/2021 0342   CREATININE 1.1 08/11/2017 1528   CALCIUM 9.1 01/26/2021 0342   CALCIUM 9.2 08/11/2017 1528   PROT 6.3 (L) 01/22/2021 1745   PROT 7.0 01/14/2021 1521   PROT 7.0 08/11/2017 1528   ALBUMIN 2.8 (L) 01/22/2021 1745   ALBUMIN 4.3 01/14/2021 1521   ALBUMIN 3.6 08/11/2017 1528   AST 25 01/22/2021 1745   AST 27 08/11/2017 1528   ALT 22 01/22/2021 1745   ALT 26 08/11/2017 1528   ALKPHOS 67 01/22/2021 1745   ALKPHOS 88 08/11/2017 1528   BILITOT 1.0 01/22/2021 1745   BILITOT 0.4 01/14/2021 1521   BILITOT 0.37 08/11/2017 1528   GFRNONAA 26 (L) 01/26/2021 0342   GFRAA 49 (L) 11/16/2020 1209   Lipase     Component Value Date/Time   LIPASE 24 01/22/2021 1745       Studies/Results: DG Abd Portable 1V-Small Bowel Obstruction Protocol-initial, 8 hr delay  Result Date: 01/25/2021 CLINICAL DATA:  Small bowel obstruction EXAM: PORTABLE ABDOMEN - 1 VIEW COMPARISON:  01/24/2021 FINDINGS: Interval reduction in number and distension of gas-filled loops of small bowel in the central abdomen, largest loops measuring approximately 5.1 cm, previously 5.7 cm. Esophagogastric tube remains with tip  and side port below the diaphragm, tip in the vicinity of the pylorus or duodenal bulb. No free air in the abdomen on supine radiographs. IMPRESSION: Interval reduction in number and distension of gas-filled loops of small bowel in the central abdomen, largest loops measuring approximately 5.1 cm, previously 5.7 cm. Esophagogastric tube remains with tip and side port below the diaphragm, tip in the vicinity of the pylorus or duodenal bulb. No free air in the abdomen on supine radiograph. Electronically Signed   By: Eddie Candle M.D.   On: 01/25/2021 13:34    Anti-infectives: Anti-infectives (From admission, onward)   None       Assessment/Plan HTN HLD PAD T2DM Gout LVH Hx of  seizures Obesity - BMI 32.34  Epigastric ventral hernia containing transverse colon - soft, reducible, does not appear to be source of obstruction SBO v ileus secondary to gastroenteritis  - KUB with slight reduction in small bowel dilatation and some possible contrast in L colon  - patient had a BM overnight - start clamping trials and check residuals during the day today - continue to mobilize as able  - continue to monitor labs - ok to have some ice chips and sips of water  FEN: NPO with ice chips, NGT clamping, IVF VTE: SQ heparin  ID: no current abx  LOS: 4 days    Norm Parcel , [facility] 01/26/2021, 9:36 AM Please see Amion for pager number during day hours 7:00am-4:30pm

## 2021-01-26 NOTE — Progress Notes (Signed)
Patient refused CPAP for the night  

## 2021-01-27 LAB — BASIC METABOLIC PANEL
Anion gap: 8 (ref 5–15)
BUN: 29 mg/dL — ABNORMAL HIGH (ref 8–23)
CO2: 31 mmol/L (ref 22–32)
Calcium: 9.4 mg/dL (ref 8.9–10.3)
Chloride: 102 mmol/L (ref 98–111)
Creatinine, Ser: 1.67 mg/dL — ABNORMAL HIGH (ref 0.44–1.00)
GFR, Estimated: 34 mL/min — ABNORMAL LOW (ref >60)
Glucose, Bld: 104 mg/dL — ABNORMAL HIGH (ref 70–99)
Potassium: 3.9 mmol/L (ref 3.5–5.1)
Sodium: 141 mmol/L (ref 135–145)

## 2021-01-27 LAB — CULTURE, BLOOD (ROUTINE X 2): Culture: NO GROWTH

## 2021-01-27 LAB — CBC
HCT: 41.3 % (ref 36.0–46.0)
Hemoglobin: 13.6 g/dL (ref 12.0–15.0)
MCH: 28.8 pg (ref 26.0–34.0)
MCHC: 32.9 g/dL (ref 30.0–36.0)
MCV: 87.3 fL (ref 80.0–100.0)
Platelets: 325 10*3/uL (ref 150–400)
RBC: 4.73 MIL/uL (ref 3.87–5.11)
RDW: 14.6 % (ref 11.5–15.5)
WBC: 24.8 10*3/uL — ABNORMAL HIGH (ref 4.0–10.5)
nRBC: 0 % (ref 0.0–0.2)

## 2021-01-27 LAB — MAGNESIUM: Magnesium: 2.2 mg/dL (ref 1.7–2.4)

## 2021-01-27 NOTE — Care Management Important Message (Signed)
Important Message  Patient Details  Name: Sara [name] MRN: [id number] Date of Birth: [birth year]   Medicare Important Message Given:  Yes     Barb Merino [place] 01/27/2021, 1:35 PM

## 2021-01-27 NOTE — Plan of Care (Signed)
  Problem: Nutrition: Goal: Adequate nutrition will be maintained Outcome: Progressing   Problem: Elimination: Goal: Will not experience complications related to bowel motility Outcome: Completed/Met   Problem: Safety: Goal: Ability to remain free from injury will improve Outcome: Progressing

## 2021-01-27 NOTE — Discharge Instructions (Signed)
Hernia, Adult     A hernia happens when tissue inside your body pushes out through a weak spot in your belly muscles (abdominal wall). This makes a round lump (bulge). The lump may be:  In a scar from surgery that was done in your belly (incisional hernia).  Near your belly button (umbilical hernia).  In your groin (inguinal hernia). Your groin is the area where your leg meets your lower belly (abdomen). This kind of hernia could also be: ? In your scrotum, if you are female. ? In folds of skin around your vagina, if you are female.  In your upper thigh (femoral hernia).  Inside your belly (hiatal hernia). This happens when your stomach slides above the muscle between your belly and your chest (diaphragm). If your hernia is small and it does not cause pain, you may not need treatment. If your hernia is large or it causes pain, you may need surgery. Follow these instructions at home: Activity  Avoid stretching or overusing (straining) the muscles near your hernia. Straining can happen when you: ? Lift something heavy. ? Poop (have a bowel movement).  Do not lift anything that is heavier than 10 lb (4.5 kg), or the limit that you are told, until your doctor says that it is safe.  Use the strength of your legs when you lift something heavy. Do not use only your back muscles to lift. General instructions  Do these things if told by your doctor so you do not have trouble pooping (constipation): ? Drink enough fluid to keep your pee (urine) pale yellow. ? Eat foods that are high in fiber. These include fresh fruits and vegetables, whole grains, and beans. ? Limit foods that are high in fat and processed sugars. These include foods that are fried or sweet. ? Take medicine for trouble pooping.  When you cough, try to cough gently.  You may try to push your hernia in by very gently pressing on it when you are lying down. Do not try to force the bulge back in if it will not push in  easily.  If you are overweight, work with your doctor to lose weight safely.  Do not use any products that have nicotine or tobacco in them. These include cigarettes and e-cigarettes. If you need help quitting, ask your doctor.  If you will be having surgery (hernia repair), watch your hernia for changes in shape, size, or color. Tell your doctor if you see any changes.  Take over-the-counter and prescription medicines only as told by your doctor.  Keep all follow-up visits as told by your doctor. Contact a doctor if:  You get new pain, swelling, or redness near your hernia.  You poop fewer times in a week than normal.  You have trouble pooping.  You have poop (stool) that is more dry than normal.  You have poop that is harder or larger than normal. Get help right away if:  You have a fever.  You have belly pain that gets worse.  You feel sick to your stomach (nauseous).  You throw up (vomit).  Your hernia cannot be pushed in by very gently pressing on it when you are lying down. Do not try to force the bulge back in if it will not push in easily.  Your hernia: ? Changes in shape or size. ? Changes color. ? Feels hard or it hurts when you touch it. These symptoms may represent a serious problem that is an emergency. Do not   wait to see if the symptoms will go away. Get medical help right away. Call your local emergency services (911 in the [place] Summary  A hernia happens when tissue inside your body pushes out through a weak spot in the belly muscles. This creates a bulge.  If your hernia is small and it does not hurt, you may not need treatment. If your hernia is large or it hurts, you may need surgery.  If you will be having surgery, watch your hernia for changes in shape, size, or color. Tell your doctor about any changes. This information is not intended to replace advice given to you by your health care provider. Make sure you discuss any questions you have with  your health care provider. Document Revised: 01/31/2019 Document Reviewed: 07/12/2017 Elsevier Patient Education  [place].

## 2021-01-27 NOTE — Progress Notes (Signed)
Physical Therapy Treatment Patient Details Name: Sara [name] MRN: [id number] DOB: June 29, 1956 Today's Date: 01/27/2021    History of Present Illness Pt adm 4/1 with nausea, vomiting, and diarrhea. Pt found to have acute renal failure superimposed on ckd. x-ray showed possible SBO and surgery consulted. Surgery found epigastric hernia with colon in sac and proximal dilation and dilation of small intestine. Surgery following for enteritis vs intermittent obstruction.  PMH - DM, gout, HTN, obesity, PAD, seizures, ckd, OSA, CAD, CABG    PT Comments    Pt received seated EOB, agreeable to therapy session and with good participation and tolerance for gait and stair training. Pt modI for all functional mobility tasks this date and needs some instruction on activity pacing due to tachycardia with exertion, RN notified (HR max 145 bpm and pt denies s/sx or pain). Pt scored 23/24 on Dynamic Gait Index indicating low fall risk. Reviewed seated exercises and will plan to bring HEP next session for seated/standing exercises and activity pacing instruction. Pt continues to benefit from PT services to progress toward functional mobility goals. Anticipate pt safe to DC home with recommendations as below once medically cleared, will continue to follow for 1-2 more sessions to reinforce activity pacing.   Follow Up Recommendations  No PT follow up     Equipment Recommendations  None recommended by PT    Recommendations for Other Services       Precautions / Restrictions Restrictions Weight Bearing Restrictions: No    Mobility  Bed Mobility Overal bed mobility: Modified Independent                  Transfers Overall transfer level: Needs assistance Equipment used: None Transfers: Sit to/from Stand Sit to Stand: Modified independent (Device/Increase time)         General transfer comment: increased time to perform  Ambulation/Gait Ambulation/Gait assistance: Supervision Gait  Distance (Feet): 300 Feet Assistive device: None Gait Pattern/deviations: Step-through pattern;Decreased stride length Gait velocity: grossly 0.6-0.7 m/s Gait velocity interpretation: >2.62 ft/sec, indicative of community ambulatory General Gait Details: no LOB without AD, see DGI; cues needed for activity pacing due to HR 120-145 bpm during stair/gait training including 1 seated break after stairs however pt asymptomatic and would have deferred break if therapist hadn't asked her to sit.   Stairs Stairs: Yes Stairs assistance: Modified independent (Device/Increase time) Stair Management: One rail Left;Alternating pattern;Forwards Number of Stairs: 12 General stair comments: reciprocal gait pattern for stairs, no LOB but reliant on handrail per support, same setup per pt as at home; tachy to 145 during stair trial   Wheelchair Mobility    Modified Rankin (Stroke Patients Only)       Balance Overall balance assessment: No apparent balance deficits (not formally assessed)                               Standardized Balance Assessment Standardized Balance Assessment : Dynamic Gait Index   Dynamic Gait Index Level Surface: Normal Change in Gait Speed: Normal Gait with Horizontal Head Turns: Normal Gait with Vertical Head Turns: Normal Gait and Pivot Turn: Normal Step Over Obstacle: Normal Step Around Obstacles: Normal Steps: Mild Impairment Total Score: 23      Cognition Arousal/Alertness: Awake/alert Behavior During Therapy: WFL for tasks assessed/performed Overall Cognitive Status: Within Functional Limits for tasks assessed  General Comments: good safety awareness, needs cues for activity pacing due to tachycardia, otherwise VSS      Exercises General Exercises - Lower Extremity Ankle Circles/Pumps: AROM;Both;10 reps;Seated Long Arc Quad: AROM;Both;Seated;5 reps Hip Flexion/Marching: AROM;Both;Seated;5  reps    General Comments General comments (skin integrity, edema, etc.): SpO2 WNL on RA but HR elevated 120-145 with exertion and very mild shortness of breath with stair trial although pt denies shortness of breath or fatigue      Pertinent Vitals/Pain Pain Assessment: No/denies pain Pain Intervention(s): Monitored during session;Repositioned           PT Goals (current goals can now be found in the care plan section) Acute Rehab PT Goals Patient Stated Goal: return home PT Goal Formulation: With patient Time For Goal Achievement: 01/31/21 Potential to Achieve Goals: Good Progress towards PT goals: Progressing toward goals    Frequency    Min 3X/week      PT Plan Current plan remains appropriate       AM-PAC PT "6 Clicks" Mobility   Outcome Measure  Help needed turning from your back to your side while in a flat bed without using bedrails?: None Help needed moving from lying on your back to sitting on the side of a flat bed without using bedrails?: None Help needed moving to and from a bed to a chair (including a wheelchair)?: None Help needed standing up from a chair using your arms (e.g., wheelchair or bedside chair)?: None Help needed to walk in hospital room?: None Help needed climbing 3-5 steps with a railing? : None 6 Click Score: 24    End of Session Equipment Utilized During Treatment: Gait belt Activity Tolerance: Patient tolerated treatment well Patient left: in chair;with call bell/phone within reach Nurse Communication: Mobility status;Other (comment) (tachycardia) PT Visit Diagnosis: Other abnormalities of gait and mobility (R26.89)     Time: 0881-1031 PT Time Calculation (min) (ACUTE ONLY): 16 min  Charges:  $Gait Training: 8-22 mins                     Sara [name] P., PTA Acute Rehabilitation Services Pager: [phone] Office: [place] 01/27/2021, 5:36 PM

## 2021-01-27 NOTE — Progress Notes (Signed)
Progress Note     Subjective: Patient reports a good sized BM x2 now and passing flatus. Denies any abdominal pain. Mobilizing when able.   Objective: Vital signs in last 24 hours: Temp:  [98.7 F (37.1 C)-100.4 F (38 C)] 98.8 F (37.1 C) (04/06 0751) Pulse Rate:  [94-109] 106 (04/06 0751) Resp:  [15-19] 19 (04/06 0751) BP: (134-153)/(69-75) 153/75 (04/06 0751) SpO2:  [98 %-100 %] 98 % (04/06 0751) Last BM Date: 01/26/21  Intake/Output from previous day: 04/05 0701 - 04/06 0700 In: 405 [P.O.:400; IV Piggyback:5] Out: 1900 [Urine:800; Emesis/NG output:1100] Intake/Output this shift: No intake/output data recorded.  PE: General: pleasant, WD, overweight female who is sitting in chair in NAD Heart: rrr   Lungs: Respiratory effort nonlabored Abd: soft, NT, ND, ventral hernia is soft and reducible  MS: all 4 extremities are symmetrical with no cyanosis, clubbing, or edema. Skin: warm and dry Psych: A&Ox3 with an appropriate affect.    Lab Results:  Recent Labs    01/26/21 0342 01/27/21 0534  WBC 15.9* 24.8*  HGB 12.6 13.6  HCT 36.7 41.3  PLT 202 325   BMET Recent Labs    01/26/21 0342 01/27/21 0534  NA 144 141  K 3.8 3.9  CL 106 102  CO2 31 31  GLUCOSE 135* 104*  BUN 46* 29*  CREATININE 2.07* 1.67*  CALCIUM 9.1 9.4   PT/INR No results for input(s): LABPROT, INR in the last 72 hours. CMP     Component Value Date/Time   NA 141 01/27/2021 0534   NA 125 (L) 01/19/2021 1723   NA 139 08/11/2017 1528   K 3.9 01/27/2021 0534   K 4.2 08/11/2017 1528   CL 102 01/27/2021 0534   CL 102 03/19/2013 1535   CO2 31 01/27/2021 0534   CO2 27 08/11/2017 1528   GLUCOSE 104 (H) 01/27/2021 0534   GLUCOSE 93 08/11/2017 1528   GLUCOSE 117 (H) 03/19/2013 1535   BUN 29 (H) 01/27/2021 0534   BUN 18 01/19/2021 1723   BUN 15.0 08/11/2017 1528   CREATININE 1.67 (H) 01/27/2021 0534   CREATININE 1.1 08/11/2017 1528   CALCIUM 9.4 01/27/2021 0534   CALCIUM 9.2  08/11/2017 1528   PROT 6.3 (L) 01/22/2021 1745   PROT 7.0 01/14/2021 1521   PROT 7.0 08/11/2017 1528   ALBUMIN 2.8 (L) 01/22/2021 1745   ALBUMIN 4.3 01/14/2021 1521   ALBUMIN 3.6 08/11/2017 1528   AST 25 01/22/2021 1745   AST 27 08/11/2017 1528   ALT 22 01/22/2021 1745   ALT 26 08/11/2017 1528   ALKPHOS 67 01/22/2021 1745   ALKPHOS 88 08/11/2017 1528   BILITOT 1.0 01/22/2021 1745   BILITOT 0.4 01/14/2021 1521   BILITOT 0.37 08/11/2017 1528   GFRNONAA 34 (L) 01/27/2021 0534   GFRAA 49 (L) 11/16/2020 1209   Lipase     Component Value Date/Time   LIPASE 24 01/22/2021 1745       Studies/Results: DG Abd 1 View  Result Date: 01/26/2021 CLINICAL DATA:  Small-bowel obstruction EXAM: ABDOMEN - 1 VIEW COMPARISON:  Abdominal radiograph yesterday FINDINGS: Gastric tube remains in good position with the tip overlying the pre-pyloric gastric antrum. Markedly reduced prominence of small bowel gas pattern. Oral contrast material now noted throughout the colon. No large free air on this single supine series. No acute osseous abnormality. IMPRESSION: 1. Resolving small bowel obstruction. 2. Oral contrast material is now noted throughout the colon. 3. Well-positioned gastrostomy tube. Electronically Signed  By: Jacqulynn Cadet M.D.   On: 01/26/2021 10:50   DG Abd Portable 1V-Small Bowel Obstruction Protocol-initial, 8 hr delay  Result Date: 01/25/2021 CLINICAL DATA:  Small bowel obstruction EXAM: PORTABLE ABDOMEN - 1 VIEW COMPARISON:  01/24/2021 FINDINGS: Interval reduction in number and distension of gas-filled loops of small bowel in the central abdomen, largest loops measuring approximately 5.1 cm, previously 5.7 cm. Esophagogastric tube remains with tip and side port below the diaphragm, tip in the vicinity of the pylorus or duodenal bulb. No free air in the abdomen on supine radiographs. IMPRESSION: Interval reduction in number and distension of gas-filled loops of small bowel in the central  abdomen, largest loops measuring approximately 5.1 cm, previously 5.7 cm. Esophagogastric tube remains with tip and side port below the diaphragm, tip in the vicinity of the pylorus or duodenal bulb. No free air in the abdomen on supine radiograph. Electronically Signed   By: Eddie Candle M.D.   On: 01/25/2021 13:34    Anti-infectives: Anti-infectives (From admission, onward)   None       Assessment/Plan HTN HLD PAD T2DM Gout LVH Hx of seizures Obesity - BMI 32.34  Epigastric ventral hernia containing transverse colon - soft, reducible, does not appear to be source of obstruction SBO v ileus secondary to gastroenteritis  - KUB with slight reduction in small bowel dilatation and some possible contrast in L colon  - patient had a BM overnight - continue to mobilize as able  - continue to monitor labs  FEN: NG removed; clear liquids, advance to regular if tolerates VTE: SQ heparin  ID: no current abx  LOS: 5 days   Nadeen Landau, MD [facility] Use [email or web address] to contact on call provider

## 2021-01-27 NOTE — Progress Notes (Signed)
PROGRESS NOTE    Sara [name]  WUJ:[id number] DOB: [birth year] DOA: 01/22/2021 PCP: [name], MD   Chief Complain: Nausea, vomiting  Brief Narrative: Patient is a 65 year old female with history of coronary disease, ischemic cardiomyopathy, OSA on CPAP, seizure disorder, chronic renal disease stage III, hypertension who presented for the evaluation of nausea, vomiting and diarrhea.  Patient was noted to be progressively lethargic couple of days before admission.she was found to be hypotensive at urgent care and was sent to the emergency department.  KUB showed SBO.  CT imaging did confirm high-grade SBO.  Lab work showed severe AKI.  Started on conservative management.  General surgery following.  Assessment & Plan:   Principal Problem:   Acute renal failure superimposed on stage 3a chronic kidney disease (HCC) Active Problems:   Essential thrombocythemia (HCC)   Hypotension due to hypovolemia   Chronic diastolic CHF (congestive heart failure) (HCC)   Sleep apnea with use of continuous positive airway pressure (CPAP)   Seizures (HCC)   CAD s/p CABG    Hyponatremia   Ischemic cardiomyopathy   Hypokalemia  SBO: General surgery following. Was on  conservative management.  Last abdominal x-ray done  showed resolving SBO.  Denies any abdominal pain today.  Had 2 bowel movements.  Start on clear liquid diet.  NG tube removed  Severe AKI on CKD stage IIIa: Creatinine elevated at 6 on presentation.  Baseline creatinine around 1.3.  Likely prerenal from dehydration.  Kidney function improved with IV fluids.  Renal ultrasound did not show any hydronephrosis.  Urine culture did not show any growth.  Foley catheter discontinued.  On Micardis at home which has been held.  Monitor kidney function  Nausea/vomiting/diarrhea: Continue gentle IV fluids, antiemetics.  The symptoms have resolved.  C. difficile/GI pathogen panel negative.  Leukocytosis: Trending up.unclear etiology, most likely  reactive.  Low suspicion for infectious etiology.  Continue to monitor.  Denies any abdominal pain, no dysuria.  Hyponatremia: Present on admission.  Resolved  Hypoglycemic episode: Continue monitor blood glucose, continue D5 along with normal saline.  Hypokalemia: Supplemented  History of chronic diastolic congestive heart failure: Presented with dehydration.  Continue IV fluids for now.  History of coronary artery disease: Status post CABG in 2018.  On aspirin, statin at home.  History of essential thrombocythemia: On anagrelide at home ,currently platelets normal  History of seizure: Takes [name] at home.  On IV [name] at present.  OSA: On CPAP at home.  History of hypertension: She will hypotensive on presentation.  Currently blood pressure stable.  On Micardis at home which is on hold.             DVT prophylaxis: Heparin [place] Code Status: Full Family Communication: Called and discussed with husband on phone on 01/27/21 Status is: Inpatient  Remains inpatient appropriate because:Inpatient level of care appropriate due to severity of illness   Dispo: The patient is from: Home              Anticipated d/c is to: Home              Patient currently is not medically stable to d/c.   Difficult to place patient No    Consultants: Surgery  Procedures:None  Antimicrobials:  Anti-infectives (From admission, onward)   None      Subjective: Patient seen and examined the bedside this morning.  Very comfortable but denies any abdomen pain, nausea or vomiting. Had 2 bowel movements.  Started on clear liquid  diet  Objective: Vitals:   01/26/21 1450 01/26/21 2047 01/27/21 0309 01/27/21 0751  BP: (!) 141/70 (!) 152/71 134/69 (!) 153/75  Pulse: 94 100 (!) 109 (!) 106  Resp: 15 18 16 19   Temp: 98.7 F (37.1 C) 100 F (37.8 C) (!) 100.4 F (38 C) 98.8 F (37.1 C)  TempSrc: Oral Oral Oral   SpO2: 100% 98% 100% 98%  Weight:      Height:        Intake/Output  Summary (Last 24 hours) at 01/27/2021 0814 Last data filed at 01/26/2021 1800 Gross per 24 hour  Intake 405 ml  Output 1900 ml  Net -1495 ml   Filed Weights   01/22/21 1553 01/22/21 2333  Weight: 80.3 kg 80.2 kg    Examination:  General exam: Overall comfortable, not in distress HEENT: PERRL Respiratory system:  no wheezes or crackles  Cardiovascular system: S1 & S2 heard, RRR.  Gastrointestinal system: Abdomen is nondistended, soft and nontender. Central nervous system: Alert and oriented Extremities: No edema, no clubbing ,no cyanosis Skin: No rashes, no ulcers,no icterus   Data Reviewed: I have personally reviewed following labs and imaging studies  CBC: Recent Labs  Lab 01/22/21 1745 01/23/21 0354 01/24/21 0239 01/25/21 0358 01/26/21 0342 01/27/21 0534  WBC 8.1 5.9 8.0 11.0* 15.9* 24.8*  NEUTROABS 5.8  --   --   --  9.7*  --   HGB 13.6 12.1 12.7 12.4 12.6 13.6  HCT 39.2 32.9* 35.5* 35.3* 36.7 41.3  MCV 84.3 81.8 81.6 83.3 83.6 87.3  PLT 246 284 226 212 202 751   Basic Metabolic Panel: Recent Labs  Lab 01/23/21 1713 01/23/21 2037 01/24/21 0239 01/24/21 0904 01/24/21 1800 01/25/21 0358 01/25/21 2039 01/26/21 0342 01/27/21 0534  NA 124*   < > 126*   < > 130* 135 138 144 141  K 2.5*   < > 3.1*   < > 3.5 3.3* 4.0 3.8 3.9  CL 88*   < > 94*   < > 97* 99 101 106 102  CO2 19*   < > 17*   < > 24 28 29 31 31   GLUCOSE 60*   < > 113*   < > 148* 155* 137* 135* 104*  BUN 87*   < > 84*   < > 67* 63* 51* 46* 29*  CREATININE 4.89*   < > 4.48*   < > 3.19* 2.79* 2.26* 2.07* 1.67*  CALCIUM 8.2*   < > 8.0*   < > 8.6* 8.8* 9.1 9.1 9.4  MG 2.5*  --  2.5*  --   --   --   --   --  2.2   < > = values in this interval not displayed.   GFR: Estimated Creatinine Clearance: 32.5 mL/min (A) (by C-G formula based on SCr of 1.67 mg/dL (H)). Liver Function Tests: Recent Labs  Lab 01/22/21 1745  AST 25  ALT 22  ALKPHOS 67  BILITOT 1.0  PROT 6.3*  ALBUMIN 2.8*   Recent Labs   Lab 01/22/21 1745  LIPASE 24   No results for input(s): AMMONIA in the last 168 hours. Coagulation Profile: No results for input(s): INR, PROTIME in the last 168 hours. Cardiac Enzymes: No results for input(s): CKTOTAL, CKMB, CKMBINDEX, TROPONINI in the last 168 hours. BNP (last 3 results) No results for input(s): PROBNP in the last 8760 hours. HbA1C: No results for input(s): HGBA1C in the last 72 hours. CBG: Recent Labs  Lab 01/23/21  1717 01/23/21 [place]   Lipid Profile: No results for input(s): CHOL, HDL, LDLCALC, TRIG, CHOLHDL, LDLDIRECT in the last 72 hours. Thyroid Function Tests: No results for input(s): TSH, T4TOTAL, FREET4, T3FREE, THYROIDAB in the last 72 hours. Anemia Panel: No results for input(s): VITAMINB12, FOLATE, FERRITIN, TIBC, IRON, RETICCTPCT in the last 72 hours. Sepsis Labs: Recent Labs  Lab 01/24/21 0026 01/24/21 0239 01/25/21 0358  LATICACIDVEN 0.8 0.8 0.9    Recent Results (from the past 240 hour(s))  Novel Coronavirus, NAA ([facility])     Status: None   Collection Time: 01/19/21  4:37 PM   Specimen: Nasopharyngeal(NP) swabs in vial transport medium  Result Value Ref Range Status   SARS-CoV-2, NAA Not Detected Not Detected Final    Comment: This nucleic acid amplification test was developed and its performance characteristics determined by [employer]. Nucleic acid amplification tests include RT-PCR and TMA. This test has not been FDA cleared or approved. This test has been authorized by FDA under an Emergency Use Authorization (EUA). This test is only authorized for the duration of time the declaration that circumstances exist justifying the authorization of the emergency use of in vitro diagnostic tests for detection of SARS-CoV-2 virus and/or diagnosis of COVID-19 infection under section 564(b)(1) of the Act, 21 U.S.C. 644IHK-7(Q) (1), unless the authorization is terminated or revoked sooner. When diagnostic testing  is negative, the possibility of a false negative result should be considered in the context of a patient's recent exposures and the presence of clinical signs and symptoms consistent with COVID-19. An individual without symptoms of COVID-19 and who is not shedding SARS-CoV-2 virus wo uld expect to have a negative (not detected) result in this assay.   SARS-COV-2, NAA 2 DAY TAT     Status: None   Collection Time: 01/19/21  4:37 PM  Result Value Ref Range Status   SARS-CoV-2, NAA 2 DAY TAT Performed  Final  Urine culture     Status: None   Collection Time: 01/22/21 10:02 AM   Specimen: Urine, Random  Result Value Ref Range Status   Specimen Description URINE, RANDOM  Final   Special Requests NONE  Final   Culture   Final    NO GROWTH Performed at [facility], [place] [address], [place], [place] [address]    Report Status 01/24/2021 FINAL  Final  Blood culture (routine x 2)     Status: None (Preliminary result)   Collection Time: 01/22/21  5:45 PM   Specimen: BLOOD  Result Value Ref Range Status   Specimen Description BLOOD SITE NOT SPECIFIED  Final   Special Requests   Final    BOTTLES DRAWN AEROBIC AND ANAEROBIC Blood Culture results may not be optimal due to an inadequate volume of blood received in culture bottles   Culture   Final    NO GROWTH 4 DAYS Performed at [facility], [place] [address], [place], [place] [address]    Report Status PENDING  Incomplete  Resp Panel by RT-PCR (Flu A&B, Covid) Nasopharyngeal Swab     Status: None   Collection Time: 01/22/21  5:51 PM   Specimen: Nasopharyngeal Swab; Nasopharyngeal(NP) swabs in vial transport medium  Result Value Ref Range Status   SARS Coronavirus 2 by RT PCR NEGATIVE NEGATIVE Final    Comment: (NOTE) SARS-CoV-2 target nucleic acids are NOT DETECTED.  The SARS-CoV-2 RNA is generally detectable in upper respiratory specimens during the acute phase of infection. The lowest concentration  of SARS-CoV-2 viral copies  this assay can detect is 138 copies/mL. A negative result does not preclude SARS-Cov-2 infection and should not be used as the sole basis for treatment or other patient management decisions. A negative result may occur with  improper specimen collection/handling, submission of specimen other than nasopharyngeal swab, presence of viral mutation(s) within the areas targeted by this assay, and inadequate number of viral copies(<138 copies/mL). A negative result must be combined with clinical observations, patient history, and epidemiological information. The expected result is Negative.  Fact Sheet for Patients:  EntrepreneurPulse.com.au  Fact Sheet for Healthcare Providers:  IncredibleEmployment.be  This test is no t yet approved or cleared by the [place] FDA and  has been authorized for detection and/or diagnosis of SARS-CoV-2 by FDA under an Emergency Use Authorization (EUA). This EUA will remain  in effect (meaning this test can be used) for the duration of the COVID-19 declaration under Section 564(b)(1) of the Act, 21 U.S.C.section 360bbb-3(b)(1), unless the authorization is terminated  or revoked sooner.       Influenza A by PCR NEGATIVE NEGATIVE Final   Influenza B by PCR NEGATIVE NEGATIVE Final    Comment: (NOTE) The Xpert Xpress SARS-CoV-2/FLU/RSV plus assay is intended as an aid in the diagnosis of influenza from Nasopharyngeal swab specimens and should not be used as a sole basis for treatment. Nasal washings and aspirates are unacceptable for Xpert Xpress SARS-CoV-2/FLU/RSV testing.  Fact Sheet for Patients: EntrepreneurPulse.com.au  Fact Sheet for Healthcare Providers: IncredibleEmployment.be  This test is not yet approved or cleared by the [place] FDA and has been authorized for detection and/or diagnosis of SARS-CoV-2 by FDA under an Emergency Use Authorization (EUA). This EUA  will remain in effect (meaning this test can be used) for the duration of the COVID-19 declaration under Section 564(b)(1) of the Act, 21 U.S.C. section 360bbb-3(b)(1), unless the authorization is terminated or revoked.  Performed at [facility], [place] [address], [place], [place] [address]   C Difficile Quick Screen w PCR reflex     Status: None   Collection Time: 01/22/21 11:15 PM   Specimen: STOOL  Result Value Ref Range Status   C Diff antigen NEGATIVE NEGATIVE Final   C Diff toxin NEGATIVE NEGATIVE Final   C Diff interpretation No C. difficile detected.  Final    Comment: Performed at [facility], [place] [address], [place], [place] [address]  Gastrointestinal Panel by PCR , Stool     Status: None   Collection Time: 01/22/21 11:15 PM   Specimen: STOOL  Result Value Ref Range Status   Campylobacter species NOT DETECTED NOT DETECTED Final   Plesimonas shigelloides NOT DETECTED NOT DETECTED Final   Salmonella species NOT DETECTED NOT DETECTED Final   Yersinia enterocolitica NOT DETECTED NOT DETECTED Final   Vibrio species NOT DETECTED NOT DETECTED Final   Vibrio cholerae NOT DETECTED NOT DETECTED Final   Enteroaggregative E coli (EAEC) NOT DETECTED NOT DETECTED Final   Enteropathogenic E coli (EPEC) NOT DETECTED NOT DETECTED Final   Enterotoxigenic E coli (ETEC) NOT DETECTED NOT DETECTED Final   Shiga like toxin producing E coli (STEC) NOT DETECTED NOT DETECTED Final   Shigella/Enteroinvasive E coli (EIEC) NOT DETECTED NOT DETECTED Final   Cryptosporidium NOT DETECTED NOT DETECTED Final   Cyclospora cayetanensis NOT DETECTED NOT DETECTED Final   Entamoeba histolytica NOT DETECTED NOT DETECTED Final   Giardia lamblia NOT DETECTED NOT DETECTED Final   Adenovirus F40/41 NOT  DETECTED NOT DETECTED Final   Astrovirus NOT DETECTED NOT DETECTED Final   Norovirus GI/GII NOT DETECTED NOT DETECTED Final   Rotavirus A NOT DETECTED NOT DETECTED Final   Sapovirus (I, II, IV, and  V) NOT DETECTED NOT DETECTED Final    Comment: Performed at [facility], [address] [place], [place] [address]  Blood culture (routine x 2)     Status: None (Preliminary result)   Collection Time: 01/23/21 12:10 AM   Specimen: BLOOD RIGHT HAND  Result Value Ref Range Status   Specimen Description BLOOD RIGHT HAND  Final   Special Requests   Final    AEROBIC BOTTLE ONLY Blood Culture results may not be optimal due to an inadequate volume of blood received in culture bottles   Culture   Final    NO GROWTH 3 DAYS Performed at [facility], [place] [address] [place], [place] [address]    Report Status PENDING  Incomplete         Radiology Studies: DG Abd 1 View  Result Date: 01/26/2021 CLINICAL DATA:  Small-bowel obstruction EXAM: ABDOMEN - 1 VIEW COMPARISON:  Abdominal radiograph yesterday FINDINGS: Gastric tube remains in good position with the tip overlying the pre-pyloric gastric antrum. Markedly reduced prominence of small bowel gas pattern. Oral contrast material now noted throughout the colon. No large free air on this single supine series. No acute osseous abnormality. IMPRESSION: 1. Resolving small bowel obstruction. 2. Oral contrast material is now noted throughout the colon. 3. Well-positioned gastrostomy tube. Electronically Signed   By: Jacqulynn Cadet M.D.   On: 01/26/2021 10:50   DG Abd Portable 1V-Small Bowel Obstruction Protocol-initial, 8 hr delay  Result Date: 01/25/2021 CLINICAL DATA:  Small bowel obstruction EXAM: PORTABLE ABDOMEN - 1 VIEW COMPARISON:  01/24/2021 FINDINGS: Interval reduction in number and distension of gas-filled loops of small bowel in the central abdomen, largest loops measuring approximately 5.1 cm, previously 5.7 cm. Esophagogastric tube remains with tip and side port below the diaphragm, tip in the vicinity of the pylorus or duodenal bulb. No free air in the abdomen on supine radiographs. IMPRESSION: Interval reduction in number  and distension of gas-filled loops of small bowel in the central abdomen, largest loops measuring approximately 5.1 cm, previously 5.7 cm. Esophagogastric tube remains with tip and side port below the diaphragm, tip in the vicinity of the pylorus or duodenal bulb. No free air in the abdomen on supine radiograph. Electronically Signed   By: Eddie Candle M.D.   On: 01/25/2021 13:34        Scheduled Meds: . Chlorhexidine Gluconate Cloth  6 each Topical Daily  . heparin  5,000 Units Subcutaneous Q8H  . metoprolol tartrate  2.5 mg Intravenous Q8H   Continuous Infusions: . dextrose 5 % and 0.9 % NaCl with KCl 20 mEq/L 75 mL/hr at 01/27/21 0052  . lacosamide ([name]) IV 100 mg (01/27/21 0520)  . lacosamide ([name]) IV 150 mg (01/26/21 1814)     LOS: 5 days    Time spent: 25 mins.More than 50% of that time was spent in counseling and/or coordination of care.      Shelly Coss, MD [facility] P4/03/2021, 8:14 AM

## 2021-01-27 NOTE — Progress Notes (Signed)
Pt's left arm midline IV infiltrated after given [name]. MD aware, pt aware. Midline removed. Pt given hot pack for area. No pain to area.

## 2021-01-27 NOTE — Progress Notes (Signed)
RN Sharyn Lull made aware of the IV tubing needing to be changed. RN agreed to change the tubing due to a new bag of potassium being hung.

## 2021-01-28 ENCOUNTER — Telehealth: Payer: Self-pay

## 2021-01-28 ENCOUNTER — Encounter: Payer: Self-pay | Admitting: Internal Medicine

## 2021-01-28 LAB — CBC WITH DIFFERENTIAL/PLATELET
Abs Immature Granulocytes: 1.55 10*3/uL — ABNORMAL HIGH (ref 0.00–0.07)
Basophils Absolute: 0.1 10*3/uL (ref 0.0–0.1)
Basophils Relative: 1 %
Eosinophils Absolute: 0.7 10*3/uL — ABNORMAL HIGH (ref 0.0–0.5)
Eosinophils Relative: 4 %
HCT: 35.7 % — ABNORMAL LOW (ref 36.0–46.0)
Hemoglobin: 12.1 g/dL (ref 12.0–15.0)
Immature Granulocytes: 9 %
Lymphocytes Relative: 13 %
Lymphs Abs: 2.1 10*3/uL (ref 0.7–4.0)
MCH: 29.3 pg (ref 26.0–34.0)
MCHC: 33.9 g/dL (ref 30.0–36.0)
MCV: 86.4 fL (ref 80.0–100.0)
Monocytes Absolute: 1.1 10*3/uL — ABNORMAL HIGH (ref 0.1–1.0)
Monocytes Relative: 6 %
Neutro Abs: 11.3 10*3/uL — ABNORMAL HIGH (ref 1.7–7.7)
Neutrophils Relative %: 67 %
Platelets: 363 10*3/uL (ref 150–400)
RBC: 4.13 MIL/uL (ref 3.87–5.11)
RDW: 14.6 % (ref 11.5–15.5)
WBC: 16.8 10*3/uL — ABNORMAL HIGH (ref 4.0–10.5)
nRBC: 0 % (ref 0.0–0.2)

## 2021-01-28 LAB — BASIC METABOLIC PANEL
Anion gap: 5 (ref 5–15)
BUN: 22 mg/dL (ref 8–23)
CO2: 28 mmol/L (ref 22–32)
Calcium: 8.9 mg/dL (ref 8.9–10.3)
Chloride: 104 mmol/L (ref 98–111)
Creatinine, Ser: 1.34 mg/dL — ABNORMAL HIGH (ref 0.44–1.00)
GFR, Estimated: 44 mL/min — ABNORMAL LOW (ref >60)
Glucose, Bld: 121 mg/dL — ABNORMAL HIGH (ref 70–99)
Potassium: 3.9 mmol/L (ref 3.5–5.1)
Sodium: 137 mmol/L (ref 135–145)

## 2021-01-28 LAB — CULTURE, BLOOD (ROUTINE X 2): Culture: NO GROWTH

## 2021-01-28 MED ORDER — METOPROLOL TARTRATE 50 MG PO TABS
75.0000 mg | ORAL_TABLET | Freq: Two times a day (BID) | ORAL | Status: DC
  Administered 2021-01-28: 75 mg via ORAL
  Filled 2021-01-28: qty 1

## 2021-01-28 NOTE — Progress Notes (Addendum)
Progress Note     Subjective: NAEO. Having flatus and BMs. Tolerating a regular diet without N/V or recurrence of abdominal distention/pain.  Objective: Vital signs in last 24 hours: Temp:  [98.3 F (36.8 C)-98.7 F (37.1 C)] 98.7 F (37.1 C) (04/07 0538) Pulse Rate:  [100-145] 101 (04/07 0538) Resp:  [18-24] 18 (04/07 0538) BP: (165-168)/(83-84) 168/83 (04/07 0538) SpO2:  [97 %-100 %] 100 % (04/07 0538) Last BM Date: 01/27/21  Intake/Output from previous day: 04/06 0701 - 04/07 0700 In: 480 [P.O.:480] Out: -  Intake/Output this shift: No intake/output data recorded.  PE: General: pleasant, WD, overweight female who is sitting in chair in NAD Heart: rrr   Lungs: Respiratory effort nonlabored Abd: soft, NT, ND, ventral hernia is soft and reducible  MS: all 4 extremities are symmetrical with no cyanosis, clubbing, or edema. Skin: warm and dry Psych: A&Ox3 with an appropriate affect.    Lab Results:  Recent Labs    01/27/21 0534 01/28/21 0510  WBC 24.8* 16.8*  HGB 13.6 12.1  HCT 41.3 35.7*  PLT 325 363   BMET Recent Labs    01/27/21 0534 01/28/21 0510  NA 141 137  K 3.9 3.9  CL 102 104  CO2 31 28  GLUCOSE 104* 121*  BUN 29* 22  CREATININE 1.67* 1.34*  CALCIUM 9.4 8.9   PT/INR No results for input(s): LABPROT, INR in the last 72 hours. CMP     Component Value Date/Time   NA 137 01/28/2021 0510   NA 125 (L) 01/19/2021 1723   NA 139 08/11/2017 1528   K 3.9 01/28/2021 0510   K 4.2 08/11/2017 1528   CL 104 01/28/2021 0510   CL 102 03/19/2013 1535   CO2 28 01/28/2021 0510   CO2 27 08/11/2017 1528   GLUCOSE 121 (H) 01/28/2021 0510   GLUCOSE 93 08/11/2017 1528   GLUCOSE 117 (H) 03/19/2013 1535   BUN 22 01/28/2021 0510   BUN 18 01/19/2021 1723   BUN 15.0 08/11/2017 1528   CREATININE 1.34 (H) 01/28/2021 0510   CREATININE 1.1 08/11/2017 1528   CALCIUM 8.9 01/28/2021 0510   CALCIUM 9.2 08/11/2017 1528   PROT 6.3 (L) 01/22/2021 1745   PROT 7.0  01/14/2021 1521   PROT 7.0 08/11/2017 1528   ALBUMIN 2.8 (L) 01/22/2021 1745   ALBUMIN 4.3 01/14/2021 1521   ALBUMIN 3.6 08/11/2017 1528   AST 25 01/22/2021 1745   AST 27 08/11/2017 1528   ALT 22 01/22/2021 1745   ALT 26 08/11/2017 1528   ALKPHOS 67 01/22/2021 1745   ALKPHOS 88 08/11/2017 1528   BILITOT 1.0 01/22/2021 1745   BILITOT 0.4 01/14/2021 1521   BILITOT 0.37 08/11/2017 1528   GFRNONAA 44 (L) 01/28/2021 0510   GFRAA 49 (L) 11/16/2020 1209   Lipase     Component Value Date/Time   LIPASE 24 01/22/2021 1745       Studies/Results: DG Abd 1 View  Result Date: 01/26/2021 CLINICAL DATA:  Small-bowel obstruction EXAM: ABDOMEN - 1 VIEW COMPARISON:  Abdominal radiograph yesterday FINDINGS: Gastric tube remains in good position with the tip overlying the pre-pyloric gastric antrum. Markedly reduced prominence of small bowel gas pattern. Oral contrast material now noted throughout the colon. No large free air on this single supine series. No acute osseous abnormality. IMPRESSION: 1. Resolving small bowel obstruction. 2. Oral contrast material is now noted throughout the colon. 3. Well-positioned gastrostomy tube. Electronically Signed   By: Jacqulynn Cadet M.D.   On:  01/26/2021 10:50    Anti-infectives: Anti-infectives (From admission, onward)   None       Assessment/Plan HTN HLD PAD T2DM Gout LVH Hx of seizures Obesity - BMI 32.34  Epigastric ventral hernia containing transverse colon - soft, reducible, does not appear to be source of obstruction SBO v ileus secondary to gastroenteritis  - KUB with slight reduction in small bowel dilatation and some contrast in L colon  - clinically SBO is resolving - having gas/stool, tolerating PO  - No acute surgical needs. SBO resolving. General surgery will sign off. Recommend the patient call our office to make an appointment to discuss elective ventral hernia repair. I have left our office information on the patients  AVS.  FEN: regular diet VTE: SQ heparin  ID: no current abx   LOS: 6 days   Obie Dredge, [facility] Use [email or web address] to contact on call provider

## 2021-01-28 NOTE — Telephone Encounter (Signed)
Transition Care Management Follow-up Telephone Call  Date of discharge and from where: 01/28/2021 [place]   How have you been since you were released from the hospital? GREAT  Any questions or concerns? No   Items Reviewed:  Did the pt receive and understand the discharge instructions provided? Yes   Medications obtained and verified? Yes   Any new allergies since your discharge? No   Dietary orders reviewed? Yes  Do you have support at home? Yes   Other (ie: DME, Home Health, etc) NO   Functional Questionnaire: (I = Independent and D = Dependent)  Bathing/Dressing- I   Meal Prep- I  Eating- I  Maintaining continence- I  Transferring/Ambulation- I  Managing Meds- I   Follow up appointments reviewed:    PCP Hospital f/u appt confirmed? Yes  scheduled to see  SANDER on 02/04/2021 [facility] f/u appt confirmed? Yes    Are transportation arrangements needed? No   If their condition worsens, is the pt aware to call  their PCP or go to the ED? Yes  Was the patient provided with contact information for the PCP's office or ED? Yes  Was the pt encouraged to call back with questions or concerns? Yes

## 2021-01-28 NOTE — Discharge Summary (Signed)
Physician Discharge Summary  Sara [name] QHU:[id number] DOB: 1956-03-13 DOA: 01/22/2021  PCP: [name], MD  Admit date: 01/22/2021 Discharge date: 01/28/2021  Admitted From: Home Disposition:  Home  Discharge Condition:Stable CODE STATUS:FULL Diet recommendation: Heart Healthy ( soft diet for next 1-2 days)  Brief/Interim Summary:  Patient is a 65 year old female with history of coronary disease, ischemic cardiomyopathy, OSA on CPAP, seizure disorder, chronic renal disease stage III, hypertension who presented for the evaluation of nausea, vomiting and diarrhea.  Patient was noted to be progressively lethargic couple of days before admission.she was found to be hypotensive at urgent care and was sent to the emergency department.  KUB showed SBO.  CT imaging did confirm high-grade SBO.  Lab work showed severe AKI.  Started on conservative management.  General surgery were following.  SBO has resolved with conservative management.  Currently she is having bowel movements.  Denies any abdominal pain nausea or vomiting.  AKI has significantly improved.  She is medically stable for discharge to home today.  She will follow-up with general surgery as an outpatient.  Following problems were addressed during her hospitalization:  SBO: General surgery following. Was on  conservative management.  Last abdominal x-ray done  showed resolving SBO.  Denies any abdominal pain today.  has been having bowel movements.  NG tube removed.  Tolerating diet.  Also has epigastric hernia but that is not the   cause for SBO.  Severe AKI on CKD stage IIIa: Creatinine elevated at 6 on presentation.  Baseline creatinine around 1.3.  Likely prerenal from dehydration.  Kidney function improved with IV fluids.  Renal ultrasound did not show any hydronephrosis.  Urine culture did not show any growth.  Foley catheter discontinued.    Nausea/vomiting/diarrhea:C. difficile/GI pathogen panel negative.  Symptoms have  resolved  Leukocytosis: Trending down.unclear etiology, most likely reactive.  Low suspicion for infectious etiology.  Check CBC in a week  Hyponatremia: Present on admission.  Resolved.  Hypokalemia: Supplemented  History of chronic diastolic congestive heart failure: Presented with dehydration.    Currently euvolemic  History of coronary artery disease: Status post CABG in 2018.  On aspirin, statin at home.  History of essential thrombocythemia: On anagrelide at home ,currently platelets normal  History of seizure: Takes [name] at home.    OSA: On CPAP at home.  History of hypertension: She was hypotensive on presentation.  Currently blood pressure stable.    Continue home medications    Discharge Diagnoses:  Principal Problem:   Acute renal failure superimposed on stage 3a chronic kidney disease (HCC) Active Problems:   Essential thrombocythemia (HCC)   Hypotension due to hypovolemia   Chronic diastolic CHF (congestive heart failure) (HCC)   Sleep apnea with use of continuous positive airway pressure (CPAP)   Seizures (HCC)   CAD s/p CABG    Hyponatremia   Ischemic cardiomyopathy   Hypokalemia    Discharge Instructions  Discharge Instructions    Diet - low sodium heart healthy   Complete by: As directed    Take soft diet for next 1-2 days   Discharge instructions   Complete by: As directed    1)Please follow-up with general surgery as an outpatient.  Name and number of  the provider group has been attached 2)Follow up with your PCP in a week.  Do a CBC, BMP test during the follow-up 3)Take soft diet for next 1-2 days then he can start having solid food 4)Monitor your blood pressure at home.  Increase activity slowly   Complete by: As directed      Allergies as of 01/28/2021      Reactions   Sulfa Antibiotics Itching      Medication List    TAKE these medications   anagrelide 1 MG capsule Commonly known as: [name] TAKE 1 CAPSULE BY MOUTH  DAILY What changed:   how much to take  when to take this  additional instructions   aspirin EC 81 MG tablet Take 1 tablet (81 mg total) by mouth daily. Swallow whole. What changed: additional instructions   atorvastatin 40 MG tablet Commonly known as: LIPITOR TAKE 1 TABLET BY MOUTH DAILY AT 6 PM   B COMPLEX PO Take 1 tablet by mouth daily.   magnesium (amino acid chelate) 133 MG tablet Take 1 tablet by mouth 2 (two) times daily.   metoprolol tartrate 50 MG tablet Commonly known as: LOPRESSOR Take 1.5 tablets (75 mg total) by mouth 2 (two) times daily.   multivitamin capsule Take 1 capsule by mouth daily.   telmisartan 80 MG tablet Commonly known as: MICARDIS TAKE 1 TABLET BY MOUTH EVERY DAY   [name] 100 MG Tabs Generic drug: Lacosamide Take 1 tablet by mouth See admin instructions. Take 100 mg tablet by mouth in the mornings then take 150 mg tablet by mouth in the evening. Patient states she splits the 100 mg in half to make the total dose of 150 mg in the evening per patient   [name] 150 MG Tabs Generic drug: Lacosamide Take one tablet in PM   Vitamin D3 50 MCG (2000 UT) capsule Take 2,000 Units by mouth daily.       Follow-up Information    Sur[facility], [place]. Schedule an appointment as soon as possible for a visit.   Specialty: General Surgery Why: to discuss elective repair of your hernia. Contact information: [place] [address] [phone]        [name], MD. Schedule an appointment as soon as possible for a visit in 1 week(s).   Specialty: Internal Medicine Contact information: [address] [place] [address] [phone]              Allergies  Allergen Reactions  . Sulfa Antibiotics Itching    Consultations:  General surgery   Procedures/Studies: CT ABDOMEN PELVIS WO CONTRAST  Addendum Date: 01/23/2021   ADDENDUM REPORT: 01/23/2021 23:55 ADDENDUM: These results were  called by telephone at the time of interpretation on 01/23/2021 at 11:55 pm to provider PAUL STECHSCHULTE , who verbally acknowledged these results. Electronically Signed   By: Iven Finn M.D.   On: 01/23/2021 23:55   Result Date: 01/23/2021 CLINICAL DATA:  Bowel obstruction suspected. Nausea. Abdominal pain. EXAM: CT ABDOMEN AND PELVIS WITHOUT CONTRAST TECHNIQUE: Multidetector CT imaging of the abdomen and pelvis was performed following the standard protocol without IV contrast. COMPARISON:  X-ray abdomen 01/23/2021, ultrasound renal 01/23/2021, CT abdomen pelvis 12/30/2015 FINDINGS: Lower chest: Bibasilar linear atelectasis. Right lower lobe bronchial wall thickening. Coronary artery calcifications. Hepatobiliary: No focal liver abnormality. No gallstones, gallbladder wall thickening, or pericholecystic fluid. No biliary dilatation. Pancreas: No focal lesion. Normal pancreatic contour. No surrounding inflammatory changes. No main pancreatic ductal dilatation. Spleen: Normal in size without focal abnormality. Adrenals/Urinary Tract: No adrenal nodule bilaterally. No nephrolithiasis, no hydronephrosis, and no contour-deforming renal mass. No ureterolithiasis or hydroureter. The urinary bladder is decompressed and grossly unremarkable a Foley catheter is noted to terminate within the  urinary bladder lumen. Stomach/Bowel: The stomach is distended with fluid. Air-fluid level is noted within the gastric lumen. Layering hyperdensity within the gastric lumen likely related to ingested medication. The majority of the small bowel is dilated with fluid with associated air-fluid levels. Suggestion of a transition point within the lower mid abdomen (3:70, 4:46). The ascending colon is distended with fluid with findings suggestive of pneumatosis. Appendix appears normal. Vascular/Lymphatic: No abdominal aorta or iliac aneurysm. At least moderate atherosclerotic plaque of the aorta and its branches. No abdominal, pelvic, or  inguinal lymphadenopathy. Reproductive: Status post hysterectomy. No adnexal masses. Other: No intraperitoneal free fluid. No intraperitoneal free gas. No organized fluid collection. Musculoskeletal: Moderate volume supraumbilical ventral wall hernia with an abdominal wall defect of 3.3 cm containing the mid transverse colon. Tiny fat containing paraumbilical hernia. Tiny fat containing left inguinal hernia. No suspicious lytic or blastic osseous lesions. No acute displaced fracture. Multilevel mild degenerative changes of the spine. IMPRESSION: 1. High-grade small-bowel obstruction within a transition point within the lower mid abdomen. 2. Fluid distended ascending colon and proximal transverse colon with a transition point at the entry site of the mid transverse colon into a supraumbilical hernia. Suggestion of pneumatosis of the ascending colon concerning for bowel ischemia. Please note limited evaluation for bowel ischemia on this noncontrast study. Recommend emergent surgical consultation. 3. Tiny fat containing periumbilical hernia and left inguinal hernia. 4.  Aortic Atherosclerosis (ICD10-I70.0). Electronically Signed: By: Iven Finn M.D. On: 01/23/2021 23:50   DG Abd 1 View  Result Date: 01/26/2021 CLINICAL DATA:  Small-bowel obstruction EXAM: ABDOMEN - 1 VIEW COMPARISON:  Abdominal radiograph yesterday FINDINGS: Gastric tube remains in good position with the tip overlying the pre-pyloric gastric antrum. Markedly reduced prominence of small bowel gas pattern. Oral contrast material now noted throughout the colon. No large free air on this single supine series. No acute osseous abnormality. IMPRESSION: 1. Resolving small bowel obstruction. 2. Oral contrast material is now noted throughout the colon. 3. Well-positioned gastrostomy tube. Electronically Signed   By: Jacqulynn Cadet M.D.   On: 01/26/2021 10:50   DG Abd 1 View  Result Date: 01/23/2021 CLINICAL DATA:  Abdominal pain EXAM: ABDOMEN - 1  VIEW COMPARISON:  None. FINDINGS: Diffuse small bowel dilatation is identified with a paucity of colonic gas. No free air, portal venous gas, or pneumatosis. No other acute abnormalities. IMPRESSION: Small bowel obstruction. Insert PRA call report Electronically Signed   By: Dorise Bullion III M.D   On: 01/23/2021 16:55   US RENAL  Result Date: 01/23/2021 CLINICAL DATA:  Acute kidney injury EXAM: RENAL / URINARY TRACT ULTRASOUND COMPLETE COMPARISON:  None. FINDINGS: Right Kidney: Renal measurements: 10.7 x 5.9 x 5.2 cm = volume: 172 mL. Increased renal echogenicity. Conspicuity is noted between the echogenic cortex and more hypoechoic renal pyramids. No evidence of hydronephrosis. Left Kidney: Renal measurements: 9.8 x 5.3 x 4.6 cm = volume: 123 mL. Increased renal echogenicity. Conspicuity is noted between the echogenic cortex and more hypoechoic renal pyramids. No evidence of hydronephrosis. Bladder: Foley catheter visualized in the decompressed bladder. Other: None. IMPRESSION: 1. No evidence of hydronephrosis. 2. Increased renal parenchymal echogenicity suggests underlying medical renal disease. 3. Foley catheter present within the collapsed bladder. Electronically Signed   By: Jacqulynn Cadet M.D.   On: 01/23/2021 11:37   DG Chest Portable 1 View  Result Date: 01/22/2021 CLINICAL DATA:  Chills and hypotension EXAM: PORTABLE CHEST 1 VIEW COMPARISON:  05/29/2017 FINDINGS: Post sternotomy changes. No focal  consolidation or effusion. Normal cardiac size. No pneumothorax. Hazy atelectasis right base. IMPRESSION: Hazy atelectasis right base. Electronically Signed   By: Donavan Foil M.D.   On: 01/22/2021 17:13   DG Abd Portable 1V-Small Bowel Obstruction Protocol-initial, 8 hr delay  Result Date: 01/25/2021 CLINICAL DATA:  Small bowel obstruction EXAM: PORTABLE ABDOMEN - 1 VIEW COMPARISON:  01/24/2021 FINDINGS: Interval reduction in number and distension of gas-filled loops of small bowel in the central  abdomen, largest loops measuring approximately 5.1 cm, previously 5.7 cm. Esophagogastric tube remains with tip and side port below the diaphragm, tip in the vicinity of the pylorus or duodenal bulb. No free air in the abdomen on supine radiographs. IMPRESSION: Interval reduction in number and distension of gas-filled loops of small bowel in the central abdomen, largest loops measuring approximately 5.1 cm, previously 5.7 cm. Esophagogastric tube remains with tip and side port below the diaphragm, tip in the vicinity of the pylorus or duodenal bulb. No free air in the abdomen on supine radiograph. Electronically Signed   By: Eddie Candle M.D.   On: 01/25/2021 13:34   DG Abd Portable 1V  Result Date: 01/24/2021 CLINICAL DATA:  NG tube placement EXAM: PORTABLE ABDOMEN - 1 VIEW COMPARISON:  01/23/2021 FINDINGS: An enteric tube has been placed with tip in the right upper quadrant consistent with location in the distal stomach. Gas-filled dilated small bowel consistent with small bowel obstruction. IMPRESSION: Enteric tube tip is in the right upper quadrant consistent with location in the distal stomach. Electronically Signed   By: Lucienne Capers M.D.   On: 01/24/2021 02:18       Subjective: Patient seen and examined the bedside this morning.  Hemodynamically stable for discharge today.  Discharge Exam: Vitals:   01/28/21 0538 01/28/21 0746  BP: (!) 168/83 (!) 157/81  Pulse: (!) 101 (!) 110  Resp: 18 18  Temp: 98.7 F (37.1 C) 98.7 F (37.1 C)  SpO2: 100% 98%   Vitals:   01/27/21 1730 01/27/21 2050 01/28/21 0538 01/28/21 0746  BP:  (!) 165/84 (!) 168/83 (!) 157/81  Pulse: (!) 101 100 (!) 101 (!) 110  Resp: 19 18 18 18   Temp:  98.3 F (36.8 C) 98.7 F (37.1 C) 98.7 F (37.1 C)  TempSrc:  Oral Oral   SpO2: 100% 97% 100% 98%  Weight:      Height:        General: Pt is alert, awake, not in acute distress Cardiovascular: RRR, S1/S2 +, no rubs, no gallops Respiratory: CTA bilaterally,  no wheezing, no rhonchi Abdominal: Soft, NT, ND, bowel sounds + Extremities: no edema, no cyanosis    The results of significant diagnostics from this hospitalization (including imaging, microbiology, ancillary and laboratory) are listed below for reference.     Microbiology: Recent Results (from the past 240 hour(s))  Novel Coronavirus, NAA ([facility])     Status: None   Collection Time: 01/19/21  4:37 PM   Specimen: Nasopharyngeal(NP) swabs in vial transport medium  Result Value Ref Range Status   SARS-CoV-2, NAA Not Detected Not Detected Final    Comment: This nucleic acid amplification test was developed and its performance characteristics determined by [employer]. Nucleic acid amplification tests include RT-PCR and TMA. This test has not been FDA cleared or approved. This test has been authorized by FDA under an Emergency Use Authorization (EUA). This test is only authorized for the duration of time the declaration that circumstances exist justifying the authorization of the emergency use  of in vitro diagnostic tests for detection of SARS-CoV-2 virus and/or diagnosis of COVID-19 infection under section 564(b)(1) of the Act, 21 U.S.C. 342AJG-8(T) (1), unless the authorization is terminated or revoked sooner. When diagnostic testing is negative, the possibility of a false negative result should be considered in the context of a patient's recent exposures and the presence of clinical signs and symptoms consistent with COVID-19. An individual without symptoms of COVID-19 and who is not shedding SARS-CoV-2 virus wo uld expect to have a negative (not detected) result in this assay.   SARS-COV-2, NAA 2 DAY TAT     Status: None   Collection Time: 01/19/21  4:37 PM  Result Value Ref Range Status   SARS-CoV-2, NAA 2 DAY TAT Performed  Final  Urine culture     Status: None   Collection Time: 01/22/21 10:02 AM   Specimen: Urine, Random  Result Value Ref Range Status    Specimen Description URINE, RANDOM  Final   Special Requests NONE  Final   Culture   Final    NO GROWTH Performed at [facility], [place] [address], [place], [place] [address]    Report Status 01/24/2021 FINAL  Final  Blood culture (routine x 2)     Status: None   Collection Time: 01/22/21  5:45 PM   Specimen: BLOOD  Result Value Ref Range Status   Specimen Description BLOOD SITE NOT SPECIFIED  Final   Special Requests   Final    BOTTLES DRAWN AEROBIC AND ANAEROBIC Blood Culture results may not be optimal due to an inadequate volume of blood received in culture bottles   Culture   Final    NO GROWTH 5 DAYS Performed at [facility], [place] [address] [place], [place] [address]    Report Status 01/27/2021 FINAL  Final  Resp Panel by RT-PCR (Flu A&B, Covid) Nasopharyngeal Swab     Status: None   Collection Time: 01/22/21  5:51 PM   Specimen: Nasopharyngeal Swab; Nasopharyngeal(NP) swabs in vial transport medium  Result Value Ref Range Status   SARS Coronavirus 2 by RT PCR NEGATIVE NEGATIVE Final    Comment: (NOTE) SARS-CoV-2 target nucleic acids are NOT DETECTED.  The SARS-CoV-2 RNA is generally detectable in upper respiratory specimens during the acute phase of infection. The lowest concentration of SARS-CoV-2 viral copies this assay can detect is 138 copies/mL. A negative result does not preclude SARS-Cov-2 infection and should not be used as the sole basis for treatment or other patient management decisions. A negative result may occur with  improper specimen collection/handling, submission of specimen other than nasopharyngeal swab, presence of viral mutation(s) within the areas targeted by this assay, and inadequate number of viral copies(<138 copies/mL). A negative result must be combined with clinical observations, patient history, and epidemiological information. The expected result is Negative.  Fact Sheet for Patients:   EntrepreneurPulse.com.au  Fact Sheet for Healthcare Providers:  IncredibleEmployment.be  This test is no t yet approved or cleared by the [place] FDA and  has been authorized for detection and/or diagnosis of SARS-CoV-2 by FDA under an Emergency Use Authorization (EUA). This EUA will remain  in effect (meaning this test can be used) for the duration of the COVID-19 declaration under Section 564(b)(1) of the Act, 21 U.S.C.section 360bbb-3(b)(1), unless the authorization is terminated  or revoked sooner.       Influenza A by PCR NEGATIVE NEGATIVE Final   Influenza B by PCR NEGATIVE NEGATIVE Final  Comment: (NOTE) The Xpert Xpress SARS-CoV-2/FLU/RSV plus assay is intended as an aid in the diagnosis of influenza from Nasopharyngeal swab specimens and should not be used as a sole basis for treatment. Nasal washings and aspirates are unacceptable for Xpert Xpress SARS-CoV-2/FLU/RSV testing.  Fact Sheet for Patients: EntrepreneurPulse.com.au  Fact Sheet for Healthcare Providers: IncredibleEmployment.be  This test is not yet approved or cleared by the [place] FDA and has been authorized for detection and/or diagnosis of SARS-CoV-2 by FDA under an Emergency Use Authorization (EUA). This EUA will remain in effect (meaning this test can be used) for the duration of the COVID-19 declaration under Section 564(b)(1) of the Act, 21 U.S.C. section 360bbb-3(b)(1), unless the authorization is terminated or revoked.  Performed at [facility], [place] [address], [place], [place] [address]   C Difficile Quick Screen w PCR reflex     Status: None   Collection Time: 01/22/21 11:15 PM   Specimen: STOOL  Result Value Ref Range Status   C Diff antigen NEGATIVE NEGATIVE Final   C Diff toxin NEGATIVE NEGATIVE Final   C Diff interpretation No C. difficile detected.  Final    Comment: Performed at [facility], [place] [address], [place], [place] [address]  Gastrointestinal Panel by PCR , Stool     Status: None   Collection Time: 01/22/21 11:15 PM   Specimen: STOOL  Result Value Ref Range Status   Campylobacter species NOT DETECTED NOT DETECTED Final   Plesimonas shigelloides NOT DETECTED NOT DETECTED Final   Salmonella species NOT DETECTED NOT DETECTED Final   Yersinia enterocolitica NOT DETECTED NOT DETECTED Final   Vibrio species NOT DETECTED NOT DETECTED Final   Vibrio cholerae NOT DETECTED NOT DETECTED Final   Enteroaggregative E coli (EAEC) NOT DETECTED NOT DETECTED Final   Enteropathogenic E coli (EPEC) NOT DETECTED NOT DETECTED Final   Enterotoxigenic E coli (ETEC) NOT DETECTED NOT DETECTED Final   Shiga like toxin producing E coli (STEC) NOT DETECTED NOT DETECTED Final   Shigella/Enteroinvasive E coli (EIEC) NOT DETECTED NOT DETECTED Final   Cryptosporidium NOT DETECTED NOT DETECTED Final   Cyclospora cayetanensis NOT DETECTED NOT DETECTED Final   Entamoeba histolytica NOT DETECTED NOT DETECTED Final   Giardia lamblia NOT DETECTED NOT DETECTED Final   Adenovirus F40/41 NOT DETECTED NOT DETECTED Final   Astrovirus NOT DETECTED NOT DETECTED Final   Norovirus GI/GII NOT DETECTED NOT DETECTED Final   Rotavirus A NOT DETECTED NOT DETECTED Final   Sapovirus (I, II, IV, and V) NOT DETECTED NOT DETECTED Final    Comment: Performed at [facility], [place] [place], [place] [address]  Blood culture (routine x 2)     Status: None (Preliminary result)   Collection Time: 01/23/21 12:10 AM   Specimen: BLOOD RIGHT HAND  Result Value Ref Range Status   Specimen Description BLOOD RIGHT HAND  Final   Special Requests   Final    AEROBIC BOTTLE ONLY Blood Culture results may not be optimal due to an inadequate volume of blood received in culture bottles   Culture   Final    NO GROWTH 4 DAYS Performed at [facility], [address] [place], [place] [address]     Report Status PENDING  Incomplete     Labs: BNP (last 3 results) Recent Labs    01/22/21 1745  BNP 295.6*   Basic Metabolic Panel: Recent Labs  Lab 01/23/21 1713 01/23/21 2037 01/24/21 0239 01/24/21  4709 01/25/21 0358 01/25/21 2039 01/26/21 0342 01/27/21 0534 01/28/21 0510  NA 124*   < > 126*   < > 135 138 144 141 137  K 2.5*   < > 3.1*   < > 3.3* 4.0 3.8 3.9 3.9  CL 88*   < > 94*   < > 99 101 106 102 104  CO2 19*   < > 17*   < > 28 29 31 31 28   GLUCOSE 60*   < > 113*   < > 155* 137* 135* 104* 121*  BUN 87*   < > 84*   < > 63* 51* 46* 29* 22  CREATININE 4.89*   < > 4.48*   < > 2.79* 2.26* 2.07* 1.67* 1.34*  CALCIUM 8.2*   < > 8.0*   < > 8.8* 9.1 9.1 9.4 8.9  MG 2.5*  --  2.5*  --   --   --   --  2.2  --    < > = values in this interval not displayed.   Liver Function Tests: Recent Labs  Lab 01/22/21 1745  AST 25  ALT 22  ALKPHOS 67  BILITOT 1.0  PROT 6.3*  ALBUMIN 2.8*   Recent Labs  Lab 01/22/21 1745  LIPASE 24   No results for input(s): AMMONIA in the last 168 hours. CBC: Recent Labs  Lab 01/22/21 1745 01/23/21 0354 01/24/21 0239 01/25/21 0358 01/26/21 0342 01/27/21 0534 01/28/21 0510  WBC 8.1   < > 8.0 11.0* 15.9* 24.8* 16.8*  NEUTROABS 5.8  --   --   --  9.7*  --  11.3*  HGB 13.6   < > 12.7 12.4 12.6 13.6 12.1  HCT 39.2   < > 35.5* 35.3* 36.7 41.3 35.7*  MCV 84.3   < > 81.6 83.3 83.6 87.3 86.4  PLT 246   < > 226 212 202 325 363   < > = values in this interval not displayed.   Cardiac Enzymes: No results for input(s): CKTOTAL, CKMB, CKMBINDEX, TROPONINI in the last 168 hours. BNP: Invalid input(s): POCBNP CBG: Recent Labs  Lab 01/23/21 1717 01/23/21 1936  GLUCAP 59* 79   D-Dimer No results for input(s): DDIMER in the last 72 hours. Hgb A1c No results for input(s): HGBA1C in the last 72 hours. Lipid Profile No results for input(s): CHOL, HDL, LDLCALC, TRIG, CHOLHDL, LDLDIRECT in the last 72 hours. Thyroid function studies No  results for input(s): TSH, T4TOTAL, T3FREE, THYROIDAB in the last 72 hours.  Invalid input(s): FREET3 Anemia work up No results for input(s): VITAMINB12, FOLATE, FERRITIN, TIBC, IRON, RETICCTPCT in the last 72 hours. Urinalysis    Component Value Date/Time   COLORURINE YELLOW 01/23/2021 1029   APPEARANCEUR HAZY (A) 01/23/2021 1029   LABSPEC 1.010 01/23/2021 1029   PHURINE 5.0 01/23/2021 1029   GLUCOSEU NEGATIVE 01/23/2021 1029   HGBUR NEGATIVE 01/23/2021 1029   BILIRUBINUR NEGATIVE 01/23/2021 1029   BILIRUBINUR Negative 11/04/2020 1720   KETONESUR NEGATIVE 01/23/2021 1029   PROTEINUR NEGATIVE 01/23/2021 1029   UROBILINOGEN 0.2 11/04/2020 1720   UROBILINOGEN 0.2 04/14/2014 0119   NITRITE NEGATIVE 01/23/2021 1029   LEUKOCYTESUR NEGATIVE 01/23/2021 1029   Sepsis Labs Invalid input(s): PROCALCITONIN,  WBC,  LACTICIDVEN Microbiology Recent Results (from the past 240 hour(s))  Novel Coronavirus, NAA ([facility])     Status: None   Collection Time: 01/19/21  4:37 PM   Specimen: Nasopharyngeal(NP) swabs in vial transport medium  Result Value Ref Range Status  SARS-CoV-2, NAA Not Detected Not Detected Final    Comment: This nucleic acid amplification test was developed and its performance characteristics determined by [employer]. Nucleic acid amplification tests include RT-PCR and TMA. This test has not been FDA cleared or approved. This test has been authorized by FDA under an Emergency Use Authorization (EUA). This test is only authorized for the duration of time the declaration that circumstances exist justifying the authorization of the emergency use of in vitro diagnostic tests for detection of SARS-CoV-2 virus and/or diagnosis of COVID-19 infection under section 564(b)(1) of the Act, 21 U.S.C. 088PJS-3(P) (1), unless the authorization is terminated or revoked sooner. When diagnostic testing is negative, the possibility of a false negative result should be considered  in the context of a patient's recent exposures and the presence of clinical signs and symptoms consistent with COVID-19. An individual without symptoms of COVID-19 and who is not shedding SARS-CoV-2 virus wo uld expect to have a negative (not detected) result in this assay.   SARS-COV-2, NAA 2 DAY TAT     Status: None   Collection Time: 01/19/21  4:37 PM  Result Value Ref Range Status   SARS-CoV-2, NAA 2 DAY TAT Performed  Final  Urine culture     Status: None   Collection Time: 01/22/21 10:02 AM   Specimen: Urine, Random  Result Value Ref Range Status   Specimen Description URINE, RANDOM  Final   Special Requests NONE  Final   Culture   Final    NO GROWTH Performed at [facility], [place] [address] [place], [place] [address]    Report Status 01/24/2021 FINAL  Final  Blood culture (routine x 2)     Status: None   Collection Time: 01/22/21  5:45 PM   Specimen: BLOOD  Result Value Ref Range Status   Specimen Description BLOOD SITE NOT SPECIFIED  Final   Special Requests   Final    BOTTLES DRAWN AEROBIC AND ANAEROBIC Blood Culture results may not be optimal due to an inadequate volume of blood received in culture bottles   Culture   Final    NO GROWTH 5 DAYS Performed at [facility], [place] [address], [place], [place] [address]    Report Status 01/27/2021 FINAL  Final  Resp Panel by RT-PCR (Flu A&B, Covid) Nasopharyngeal Swab     Status: None   Collection Time: 01/22/21  5:51 PM   Specimen: Nasopharyngeal Swab; Nasopharyngeal(NP) swabs in vial transport medium  Result Value Ref Range Status   SARS Coronavirus 2 by RT PCR NEGATIVE NEGATIVE Final    Comment: (NOTE) SARS-CoV-2 target nucleic acids are NOT DETECTED.  The SARS-CoV-2 RNA is generally detectable in upper respiratory specimens during the acute phase of infection. The lowest concentration of SARS-CoV-2 viral copies this assay can detect is 138 copies/mL. A negative result does not preclude  SARS-Cov-2 infection and should not be used as the sole basis for treatment or other patient management decisions. A negative result may occur with  improper specimen collection/handling, submission of specimen other than nasopharyngeal swab, presence of viral mutation(s) within the areas targeted by this assay, and inadequate number of viral copies(<138 copies/mL). A negative result must be combined with clinical observations, patient history, and epidemiological information. The expected result is Negative.  Fact Sheet for Patients:  EntrepreneurPulse.com.au  Fact Sheet for Healthcare Providers:  IncredibleEmployment.be  This test is no t yet approved or cleared by the [place] FDA and  has been  authorized for detection and/or diagnosis of SARS-CoV-2 by FDA under an Emergency Use Authorization (EUA). This EUA will remain  in effect (meaning this test can be used) for the duration of the COVID-19 declaration under Section 564(b)(1) of the Act, 21 U.S.C.section 360bbb-3(b)(1), unless the authorization is terminated  or revoked sooner.       Influenza A by PCR NEGATIVE NEGATIVE Final   Influenza B by PCR NEGATIVE NEGATIVE Final    Comment: (NOTE) The Xpert Xpress SARS-CoV-2/FLU/RSV plus assay is intended as an aid in the diagnosis of influenza from Nasopharyngeal swab specimens and should not be used as a sole basis for treatment. Nasal washings and aspirates are unacceptable for Xpert Xpress SARS-CoV-2/FLU/RSV testing.  Fact Sheet for Patients: EntrepreneurPulse.com.au  Fact Sheet for Healthcare Providers: IncredibleEmployment.be  This test is not yet approved or cleared by the [place] FDA and has been authorized for detection and/or diagnosis of SARS-CoV-2 by FDA under an Emergency Use Authorization (EUA). This EUA will remain in effect (meaning this test can be used) for the duration of  the COVID-19 declaration under Section 564(b)(1) of the Act, 21 U.S.C. section 360bbb-3(b)(1), unless the authorization is terminated or revoked.  Performed at [facility], [place] [address], [place], [place] [address]   C Difficile Quick Screen w PCR reflex     Status: None   Collection Time: 01/22/21 11:15 PM   Specimen: STOOL  Result Value Ref Range Status   C Diff antigen NEGATIVE NEGATIVE Final   C Diff toxin NEGATIVE NEGATIVE Final   C Diff interpretation No C. difficile detected.  Final    Comment: Performed at [facility], [place] [address], [place], [place] [address]  Gastrointestinal Panel by PCR , Stool     Status: None   Collection Time: 01/22/21 11:15 PM   Specimen: STOOL  Result Value Ref Range Status   Campylobacter species NOT DETECTED NOT DETECTED Final   Plesimonas shigelloides NOT DETECTED NOT DETECTED Final   Salmonella species NOT DETECTED NOT DETECTED Final   Yersinia enterocolitica NOT DETECTED NOT DETECTED Final   Vibrio species NOT DETECTED NOT DETECTED Final   Vibrio cholerae NOT DETECTED NOT DETECTED Final   Enteroaggregative E coli (EAEC) NOT DETECTED NOT DETECTED Final   Enteropathogenic E coli (EPEC) NOT DETECTED NOT DETECTED Final   Enterotoxigenic E coli (ETEC) NOT DETECTED NOT DETECTED Final   Shiga like toxin producing E coli (STEC) NOT DETECTED NOT DETECTED Final   Shigella/Enteroinvasive E coli (EIEC) NOT DETECTED NOT DETECTED Final   Cryptosporidium NOT DETECTED NOT DETECTED Final   Cyclospora cayetanensis NOT DETECTED NOT DETECTED Final   Entamoeba histolytica NOT DETECTED NOT DETECTED Final   Giardia lamblia NOT DETECTED NOT DETECTED Final   Adenovirus F40/41 NOT DETECTED NOT DETECTED Final   Astrovirus NOT DETECTED NOT DETECTED Final   Norovirus GI/GII NOT DETECTED NOT DETECTED Final   Rotavirus A NOT DETECTED NOT DETECTED Final   Sapovirus (I, II, IV, and V) NOT DETECTED NOT DETECTED Final    Comment: Performed at [facility], [address] [address] [place], [place] [address]  Blood culture (routine x 2)     Status: None (Preliminary result)   Collection Time: 01/23/21 12:10 AM   Specimen: BLOOD RIGHT HAND  Result Value Ref Range Status   Specimen Description BLOOD RIGHT HAND  Final   Special Requests   Final    AEROBIC BOTTLE ONLY Blood Culture results may not be optimal due to  an inadequate volume of blood received in culture bottles   Culture   Final    NO GROWTH 4 DAYS Performed at [facility], [place] [address] [place], [place] [address]    Report Status PENDING  Incomplete    Please note: You were cared for by a hospitalist during your hospital stay. Once you are discharged, your primary care physician will handle any further medical issues. Please note that NO REFILLS for any discharge medications will be authorized once you are discharged, as it is imperative that you return to your primary care physician (or establish a relationship with a primary care physician if you do not have one) for your post hospital discharge needs so that they can reassess your need for medications and monitor your lab values.    Time coordinating discharge: 40 minutes  SIGNED:   Shelly Coss, MD  [facility] 01/28/2021, 10:14 AM Pager [phone]  If 7PM-7AM, please contact night-coverage [email or web address] Password [email or web address]

## 2021-01-29 ENCOUNTER — Telehealth: Payer: Self-pay

## 2021-01-29 NOTE — Telephone Encounter (Signed)
The pt said that she has already been scheduled and called for transition of care call.

## 2021-02-04 ENCOUNTER — Encounter: Payer: Self-pay | Admitting: Internal Medicine

## 2021-02-04 ENCOUNTER — Ambulatory Visit (INDEPENDENT_AMBULATORY_CARE_PROVIDER_SITE_OTHER): Payer: Medicare Other | Admitting: Internal Medicine

## 2021-02-04 ENCOUNTER — Other Ambulatory Visit: Payer: Self-pay

## 2021-02-04 VITALS — BP 146/82 | HR 89 | Temp 98.6°F | Ht 62.0 in | Wt 171.0 lb

## 2021-02-04 DIAGNOSIS — K56609 Unspecified intestinal obstruction, unspecified as to partial versus complete obstruction: Secondary | ICD-10-CM

## 2021-02-04 DIAGNOSIS — I5032 Chronic diastolic (congestive) heart failure: Secondary | ICD-10-CM

## 2021-02-04 DIAGNOSIS — N1831 Chronic kidney disease, stage 3a: Secondary | ICD-10-CM

## 2021-02-04 DIAGNOSIS — I130 Hypertensive heart and chronic kidney disease with heart failure and stage 1 through stage 4 chronic kidney disease, or unspecified chronic kidney disease: Secondary | ICD-10-CM | POA: Diagnosis not present

## 2021-02-04 DIAGNOSIS — D72828 Other elevated white blood cell count: Secondary | ICD-10-CM

## 2021-02-04 DIAGNOSIS — E871 Hypo-osmolality and hyponatremia: Secondary | ICD-10-CM

## 2021-02-04 DIAGNOSIS — K439 Ventral hernia without obstruction or gangrene: Secondary | ICD-10-CM

## 2021-02-04 NOTE — Patient Instructions (Signed)

## 2021-02-04 NOTE — Progress Notes (Signed)
I,[name],acting as a Education administrator for [name], MD.,have documented all relevant documentation on the behalf of [name], MD,as directed by  [name], MD while in the presence of [name], MD.  This visit occurred during the SARS-CoV-2 public health emergency.  Safety protocols were in place, including screening questions prior to the visit, additional usage of staff PPE, and extensive cleaning of exam room while observing appropriate contact time as indicated for disinfecting solutions.  Subjective:     Patient ID: Sara [name] , female    DOB: 1956-01-14 , 65 y.o.   MRN: [id number]   Chief Complaint  Patient presents with  . hospital f/u    HPI  The patient is in today for a hospital f/u.  She was recently hospitalized from 4/1-4/7 for SBO. She presented to ER on 4/1 with complaint of nausea, vomiting and diarrhea. She went to UC prior to coming to ER, but due to progressively worsening lethargy and hypotension, she was sent to ER for further evaluation. ER workup included KUB which showed SBO. CT imaging did confirm high-grade SBO. Gen. Surgery was consulted.  SBO was treated with NG tube. Lab work showed severe AKI. SBO resolved with conservative management. AKI resolved with hydration, bp also normalized.  She was having bowel movements prior to discharge. Since discharge, she has not had any recurrence of n/v or abdominal pain.     Past Medical History:  Diagnosis Date  . Diabetes mellitus   . Gout   . Hyperlipemia   . Hypertension   . LVH (left ventricular hypertrophy)    12/21/09 Echo -mild asymmetric LVH,EF =>55%  . Morbid obesity ([place])   . OSA (obstructive sleep apnea)   . PAD (peripheral artery disease) ([place])   . Renal artery stenosis ([place]) 12/13/2010   PTCA and Stent - right  . Seizures ([place])   . Syncopal episodes   . Thrombocytosis      Family History  Problem Relation Age of Onset  . Heart failure Mother   . Diabetes  Father   . Diabetes Sister   . Hypertension Sister   . Hypertension Sister   . Hypertension Sister   . Breast cancer Maternal Aunt        2 maternal aunts had breast cancer.  . Breast cancer Maternal Grandmother      Current Outpatient Medications:  .  anagrelide ([name]) 1 MG capsule, TAKE 1 CAPSULE BY MOUTH DAILY (Patient taking differently: Take 1-1.5 mg by mouth See admin instructions. Take 1 mg tablet by mouth every day except on Mon, wednesdays and Fridays take 1.$RemoveBefore'5mg'uKoldsnpqWOfq$  tablet per patient), Disp: 30 capsule, Rfl: 5 .  aspirin EC 81 MG tablet, Take 1 tablet (81 mg total) by mouth daily. Swallow whole. (Patient taking differently: Take 81 mg by mouth daily.), Disp: 150 tablet, Rfl: 2 .  atorvastatin (LIPITOR) 40 MG tablet, TAKE 1 TABLET BY MOUTH DAILY AT 6 PM, Disp: 90 tablet, Rfl: 2 .  B Complex Vitamins (B COMPLEX PO), Take 1 tablet by mouth daily. , Disp: , Rfl:  .  Cholecalciferol (VITAMIN D3) 2000 UNITS capsule, Take 2,000 Units by mouth daily. , Disp: , Rfl:  .  Lacosamide ([name]) 100 MG TABS, Take 1 tablet by mouth See admin instructions. Take 100 mg tablet by mouth in the mornings then take 150 mg tablet by mouth in the evening. Patient states she splits the 100 mg in half to make the total dose of  150 mg in the evening per patient, Disp: , Rfl:  .  Lacosamide ([name]) 150 MG TABS, Take one tablet in PM, Disp: 14 tablet, Rfl: 0 .  metoprolol tartrate (LOPRESSOR) 50 MG tablet, Take 1.5 tablets (75 mg total) by mouth 2 (two) times daily., Disp: 90 tablet, Rfl: 3 .  Multiple Vitamin (MULTIVITAMIN) capsule, Take 1 capsule by mouth daily., Disp: , Rfl:  .  Specialty Vitamins Products (MAGNESIUM, AMINO ACID CHELATE,) 133 MG tablet, Take 1 tablet by mouth 2 (two) times daily., Disp: , Rfl:  .  telmisartan (MICARDIS) 80 MG tablet, TAKE 1 TABLET BY MOUTH EVERY DAY (Patient taking differently: Take 80 mg by mouth daily.), Disp: 90 tablet, Rfl: 1   Allergies  Allergen Reactions  . Sulfa  Antibiotics Itching     Review of Systems  Constitutional: Negative.   Respiratory: Negative.   Cardiovascular: Negative.   Gastrointestinal: Negative.   Psychiatric/Behavioral: Negative.   All other systems reviewed and are negative.    Today's Vitals   02/04/21 1413  BP: (!) 146/82  Pulse: 89  Temp: 98.6 F (37 C)  TempSrc: Oral  Weight: 171 lb (77.6 kg)  Height: $Remove'5\' 2"'ZVnKppa$  (1.575 m)   Body mass index is 31.28 kg/m.  Wt Readings from Last 3 Encounters:  03/03/21 168 lb 6.4 oz (76.4 kg)  02/04/21 171 lb (77.6 kg)  01/22/21 176 lb 12.9 oz (80.2 kg)   Objective:  Physical Exam Vitals and nursing note reviewed.  Constitutional:      Appearance: Normal appearance.  HENT:     Head: Normocephalic and atraumatic.     Nose:     Comments: Masked     Mouth/Throat:     Comments: Masked  Cardiovascular:     Rate and Rhythm: Normal rate and regular rhythm.     Heart sounds: Normal heart sounds.  Pulmonary:     Effort: Pulmonary effort is normal.     Breath sounds: Normal breath sounds.  Abdominal:     General: Bowel sounds are normal. There is no distension.     Palpations: Abdomen is soft.     Tenderness: There is no abdominal tenderness.  Musculoskeletal:     Cervical back: Normal range of motion.  Skin:    General: Skin is warm.  Neurological:     General: No focal deficit present.     Mental Status: She is alert.  Psychiatric:        Mood and Affect: Mood normal.        Behavior: Behavior normal.         Assessment And Plan:     1. Small bowel obstruction (HCC)  Resolving.  Discharge summary reviewed in full detail. She is encouraged to keep upcoming appt with Gen. Surgery. I also met with her daughter after the visit to review labs, goals of treatment. TCM PERFORMED. A MEMBER OF THE CLINICAL TEAM SPOKE WITH THE PATIENT UPON DISCHARGE. DISCHARGE SUMMARY WAS REVIEWED IN FULL DETAIL DURING THE VISIT. MEDS RECONCILED AND COMPARED TO DISCHARGE MEDS. MEDICATION LIST  WAS UPDATED AND REVIEWED WITH THE PATIENT. GREATER THAN 50% FACE TO FACE TIME WAS SPENT IN COUNSELING AND COORDINATION OF CARE. ALL QUESTIONS WERE ANSWERED TO THE SATISFACTION OF THE PATIENT.   2. Other elevated white blood cell (WBC) count Comments: She had leukocytosis while in hospital. I will recheck CBC today.  - CBC with Diff  3. Hyponatremia Comments: I will check BMP today.  - BMP8+EGFR  4. Hypertensive heart  and renal disease with renal failure, stage 1 through stage 4 or unspecified chronic kidney disease, with heart failure (HCC) Comments: Chronic, uncontrolled. Encouraged to take meds as prescribed and to follow low sodium diet.  - BMP8+EGFR  5. Stage 3a chronic kidney disease ([place]) Comments: I will check renal function today.   6. Chronic diastolic congestive heart failure ([place]) Comments: Chronic, again importance of maintaining good BP control and dietary compliance was discussed with the patient.   7. Ventral hernia without obstruction or gangrene Comments: Chronic. She plans to see Surgeon for f/u within 2 weeks.  Patient was given opportunity to ask questions. Patient verbalized understanding of the plan and was able to repeat key elements of the plan. All questions were answered to their satisfaction.   I, [name], MD, have reviewed all documentation for this visit. The documentation on 03/07/21 for the exam, diagnosis, procedures, and orders are all accurate and complete.   IF YOU HAVE BEEN REFERRED TO A SPECIALIST, IT MAY TAKE 1-2 WEEKS TO SCHEDULE/PROCESS THE REFERRAL. IF YOU HAVE NOT HEARD FROM US/SPECIALIST IN TWO WEEKS, PLEASE GIVE [place] A CALL AT [phone].   THE PATIENT IS ENCOURAGED TO PRACTICE SOCIAL DISTANCING DUE TO THE COVID-19 PANDEMIC.

## 2021-02-05 LAB — CBC WITH DIFFERENTIAL/PLATELET
Basophils Absolute: 0.1 10*3/uL (ref 0.0–0.2)
Basos: 1 %
EOS (ABSOLUTE): 0.2 10*3/uL (ref 0.0–0.4)
Eos: 3 %
Hematocrit: 35.3 % (ref 34.0–46.6)
Hemoglobin: 12 g/dL (ref 11.1–15.9)
Immature Grans (Abs): 0 10*3/uL (ref 0.0–0.1)
Immature Granulocytes: 0 %
Lymphocytes Absolute: 1.6 10*3/uL (ref 0.7–3.1)
Lymphs: 25 %
MCH: 28.8 pg (ref 26.6–33.0)
MCHC: 34 g/dL (ref 31.5–35.7)
MCV: 85 fL (ref 79–97)
Monocytes Absolute: 0.4 10*3/uL (ref 0.1–0.9)
Monocytes: 7 %
Neutrophils Absolute: 3.9 10*3/uL (ref 1.4–7.0)
Neutrophils: 64 %
Platelets: 695 10*3/uL — ABNORMAL HIGH (ref 150–450)
RBC: 4.17 x10E6/uL (ref 3.77–5.28)
RDW: 13.8 % (ref 11.7–15.4)
WBC: 6.1 10*3/uL (ref 3.4–10.8)

## 2021-02-05 LAB — BMP8+EGFR
BUN/Creatinine Ratio: 9 — ABNORMAL LOW (ref 12–28)
BUN: 12 mg/dL (ref 8–27)
CO2: 24 mmol/L (ref 20–29)
Calcium: 10 mg/dL (ref 8.7–10.3)
Chloride: 100 mmol/L (ref 96–106)
Creatinine, Ser: 1.37 mg/dL — ABNORMAL HIGH (ref 0.57–1.00)
Glucose: 109 mg/dL — ABNORMAL HIGH (ref 65–99)
Potassium: 4.8 mmol/L (ref 3.5–5.2)
Sodium: 138 mmol/L (ref 134–144)
eGFR: 43 mL/min/{1.73_m2} — ABNORMAL LOW (ref >59)

## 2021-02-07 ENCOUNTER — Encounter: Payer: Self-pay | Admitting: Internal Medicine

## 2021-02-11 DIAGNOSIS — E785 Hyperlipidemia, unspecified: Secondary | ICD-10-CM | POA: Diagnosis not present

## 2021-02-11 DIAGNOSIS — E1122 Type 2 diabetes mellitus with diabetic chronic kidney disease: Secondary | ICD-10-CM | POA: Diagnosis not present

## 2021-02-11 DIAGNOSIS — I129 Hypertensive chronic kidney disease with stage 1 through stage 4 chronic kidney disease, or unspecified chronic kidney disease: Secondary | ICD-10-CM | POA: Diagnosis not present

## 2021-02-11 DIAGNOSIS — N183 Chronic kidney disease, stage 3 unspecified: Secondary | ICD-10-CM | POA: Diagnosis not present

## 2021-02-26 NOTE — Progress Notes (Signed)
[place]   [phone] [phone]) [phone]   Clinic Follow up Note   Patient Care Team: [name], MD as PCP - General (Internal Medicine) Sanda Klein, MD as PCP - Cardiology (Cardiology)  Date of Service:  03/03/2021  CHIEF COMPLAINT: F/u of Essential Thrombocytosis   CURRENT THERAPY:  -Hydroxyurea 500 mg daily, started in 2010., increased to hydrea 1000mg  daily on Monday and Thursday, and 500mg  daily for the rest of week in 01/2015 -Aspirin 81 mg daily.  -Switched Hydrea to anagrelide1mg daily10/2017. Held and was on hydrea from 01/2019-5/2020due to Anagrelide on back order.Increase Anagrelide to 1mg  daily in AM and on MWF add 0.5mg  in the PM starting 01/03/20.Reduced to Anagrelide 1mg  daily from 01/28/21 (after hospitalization)  INTERVAL HISTORY:  Sara [name] is here for a follow up of ET. She was last seen by me 6 months ago and seen by NP Lacie 3 months ago in interim. She presents to the clinic alone. She notes she had intractable vomiting and diarrhea. She went to her PCP but was given antibiotics as it was considered stomach virus. This did not resolve she ended up being taken to ED and was seen to have bowl obstruction that lead to renal failure. She was hospitalized for 7 days from April 1st and was able to recover without procedure. She has hernia as well which has contributed to this. She notes she has lost a lot of weight during this time. She is overall getting back to baseline and hesitant about another hernia surgery.   She was off Anagrelide during her hospitalization. Since hospitalization she restarted Anagrelide 1mg  daily. She notes she is paying $47 for her medication. I reviewed her medication list with her.     REVIEW OF SYSTEMS:   Constitutional: Denies fevers, chills or abnormal weight loss Eyes: Denies blurriness of vision Ears, nose, mouth, throat, and face: Denies mucositis or sore throat Respiratory: Denies cough,  dyspnea or wheezes Cardiovascular: Denies palpitation, chest discomfort or lower extremity swelling Gastrointestinal:  Denies nausea, heartburn or change in bowel habits Skin: Denies abnormal skin rashes Lymphatics: Denies new lymphadenopathy or easy bruising Neurological:Denies numbness, tingling or new weaknesses Behavioral/Psych: Mood is stable, no new changes  All other systems were reviewed with the patient and are negative.  MEDICAL HISTORY:  Past Medical History:  Diagnosis Date  . Diabetes mellitus   . Gout   . Hyperlipemia   . Hypertension   . LVH (left ventricular hypertrophy)    12/21/09 Echo -mild asymmetric LVH,EF =>55%  . Morbid obesity ([place])   . OSA (obstructive sleep apnea)   . PAD (peripheral artery disease) ([place])   . Renal artery stenosis ([place]) 12/13/2010   PTCA and Stent - right  . Seizures ([place])   . Syncopal episodes   . Thrombocytosis     SURGICAL HISTORY: Past Surgical History:  Procedure Laterality Date  . BREAST CYST EXCISION     S/P Benign Right Breast Cyst Removal.  . BREAST REDUCTION SURGERY  1995   S/P Bilateral breast reduction  . [place]   Times  Two.  . CORONARY ARTERY BYPASS GRAFT N/A 04/21/2017   Procedure: CORONARY ARTERY BYPASS GRAFTING (CABG) x 2, USING LEFT MAMMARY ARTERY AND RIGHT GREATER SAPHENOUS VEIN HARVESTED ENDOSCOPICALLY;  Surgeon: [name], MD;  Location: [facility];  Service: Open Heart Surgery;  Laterality: N/A;  . EXTREMITY CYST EXCISION  1993   left wrist  . LEFT HEART CATH AND CORONARY  ANGIOGRAPHY N/A 04/17/2017   Procedure: Left Heart Cath and Coronary Angiography;  Surgeon: Jettie Booze, MD;  Location: [facility];  Service: Cardiovascular;  Laterality: N/A;  . PARTIAL HYSTERECTOMY  1994  . renal artery stent placement Right   . TEE WITHOUT CARDIOVERSION N/A 04/21/2017   Procedure: TRANSESOPHAGEAL ECHOCARDIOGRAM (TEE);  Surgeon: [name], MD;  Location: [facility];  Service: Open  Heart Surgery;  Laterality: N/A;    I have reviewed the social history and family history with the patient and they are unchanged from previous note.  ALLERGIES:  is allergic to sulfa antibiotics.  MEDICATIONS:  Current Outpatient Medications  Medication Sig Dispense Refill  . anagrelide ([name]) 1 MG capsule TAKE 1 CAPSULE BY MOUTH DAILY (Patient taking differently: Take 1-1.5 mg by mouth See admin instructions. Take 1 mg tablet by mouth every day except on Mon, wednesdays and Fridays take 1.5mg  tablet per patient) 30 capsule 5  . aspirin EC 81 MG tablet Take 1 tablet (81 mg total) by mouth daily. Swallow whole. (Patient taking differently: Take 81 mg by mouth daily.) 150 tablet 2  . atorvastatin (LIPITOR) 40 MG tablet TAKE 1 TABLET BY MOUTH DAILY AT 6 PM 90 tablet 2  . B Complex Vitamins (B COMPLEX PO) Take 1 tablet by mouth daily.     . Cholecalciferol (VITAMIN D3) 2000 UNITS capsule Take 2,000 Units by mouth daily.     . Lacosamide ([name]) 100 MG TABS Take 1 tablet by mouth See admin instructions. Take 100 mg tablet by mouth in the mornings then take 150 mg tablet by mouth in the evening. Patient states she splits the 100 mg in half to make the total dose of 150 mg in the evening per patient    . Lacosamide ([name]) 150 MG TABS Take one tablet in PM 14 tablet 0  . metoprolol tartrate (LOPRESSOR) 50 MG tablet Take 1.5 tablets (75 mg total) by mouth 2 (two) times daily. 90 tablet 3  . Multiple Vitamin (MULTIVITAMIN) capsule Take 1 capsule by mouth daily.    Marland Kitchen Specialty Vitamins Products (MAGNESIUM, AMINO ACID CHELATE,) 133 MG tablet Take 1 tablet by mouth 2 (two) times daily.    Marland Kitchen telmisartan (MICARDIS) 80 MG tablet TAKE 1 TABLET BY MOUTH EVERY DAY (Patient taking differently: Take 80 mg by mouth daily.) 90 tablet 1   No current facility-administered medications for this visit.    PHYSICAL EXAMINATION: ECOG PERFORMANCE STATUS: 1 - Symptomatic but completely ambulatory  Vitals:    03/03/21 0953  BP: (!) 162/80  Pulse: 69  Resp: 18  Temp: 97.9 F (36.6 C)  SpO2: 98%   Filed Weights   03/03/21 0953  Weight: 168 lb 6.4 oz (76.4 kg)    Due to COVID19 we will limit examination to appearance. Patient had no complaints.  GENERAL:alert, no distress and comfortable SKIN: skin color normal, no rashes or significant lesions EYES: normal, Conjunctiva are pink and non-injected, sclera clear  NEURO: alert & oriented x 3 with fluent speech   LABORATORY DATA:  I have reviewed the data as listed CBC Latest Ref Rng & Units 03/03/2021 02/04/2021 01/28/2021  WBC 4.0 - 10.5 K/uL 6.2 6.1 16.8(H)  Hemoglobin 12.0 - 15.0 g/dL 12.4 12.0 12.1  Hematocrit 36.0 - 46.0 % 37.4 35.3 35.7(L)  Platelets 150 - 400 K/uL 439(H) 695(H) 363     CMP Latest Ref Rng & Units 03/03/2021 02/04/2021 01/28/2021  Glucose 70 - 99 mg/dL 82 109(H) 121(H)  BUN 8 - 23 mg/dL 22 12 22   Creatinine 0.44 - 1.00 mg/dL 1.37(H) 1.37(H) 1.34(H)  Sodium 135 - 145 mmol/L 139 138 137  Potassium 3.5 - 5.1 mmol/L 3.9 4.8 3.9  Chloride 98 - 111 mmol/L 103 100 104  CO2 22 - 32 mmol/L 27 24 28   Calcium 8.9 - 10.3 mg/dL 9.6 10.0 8.9  Total Protein 6.5 - 8.1 g/dL 6.6 - -  Total Bilirubin 0.3 - 1.2 mg/dL 0.5 - -  Alkaline Phos 38 - 126 U/L 75 - -  AST 15 - 41 U/L 17 - -  ALT 0 - 44 U/L 13 - -      RADIOGRAPHIC STUDIES: I have personally reviewed the radiological images as listed and agreed with the findings in the report. No results found.   ASSESSMENT & PLAN:  Sara [name] is a 65 y.o. female with    1. Essential Thrombocythemia. JAK2 (+) -Diagnosed in 2010. JAK2 mutation was present.Herbone marrow aspirate and biopsy on 01/20/2009 which yielded a specimen that was suboptimal for evaluation. However, megakaryocytes were abundant with normal morphology. There was no clustering or excess blasts seen. Platelet count on 01/20/2009 was 881. Vernia Buff on Hydrea previously, had episodes of dizziness and syncope,  and she was concerned about the risk of leukemia, so it was stopped. The patient is being treated with aspirin and Anagrelide 1mg  in AM and on MWF add 0.5mg  in the PM.  -She was off Anagrelide for 7 days in April 2022 due to significant bowl obstruction. She restarted Anagrelide at 1mg  daily only since discharge from 01/28/21. She is tolerating well.  -Labs reviewed, plt did slightly increase on lower dose to 439K. I discussed if her platelets increase above 500K, will increase Anagrelide dose.  -Continue Anagrelide 1mg  daily for now.  -will monitor with lab every 3 months and F/u in 6 months    2.H/o ofSeizure,Recurrent Syncope episodes -Her Syncope have unclear etiology. -Shehas a heart monitor.  -continue meds, follow up with her neurologist.   3. Diabetes, HTN, Arthritis, CAD -She has been seeing a nutritionist at church. -She monitors her sugar level regularly -She has recent arthritis in her left knee.  -Coronary artery disease status post bypass surgery grafting x 2 by Dr. Cyndia Bent in June 2018. -Continue to f/u with cardiologist and PCP    PLAN: -Continue anagrelide 1 mgdaily -Continue aspirin.  -labin 3 and 6 months  -F/u in 57months   No problem-specific Assessment & Plan notes found for this encounter.   No orders of the defined types were placed in this encounter.  All questions were answered. The patient knows to call the clinic with any problems, questions or concerns. No barriers to learning was detected. The total time spent in the appointment was 20 minutes.     Truitt Merle, MD 03/03/2021   I, Joslyn Devon, am acting as scribe for Truitt Merle, MD.   I have reviewed the above documentation for accuracy and completeness, and I agree with the above.

## 2021-02-28 ENCOUNTER — Encounter: Payer: Self-pay | Admitting: Hematology

## 2021-03-03 ENCOUNTER — Other Ambulatory Visit: Payer: Self-pay

## 2021-03-03 ENCOUNTER — Inpatient Hospital Stay: Payer: BC Managed Care – PPO | Attending: Nurse Practitioner

## 2021-03-03 ENCOUNTER — Encounter: Payer: Self-pay | Admitting: Hematology

## 2021-03-03 ENCOUNTER — Inpatient Hospital Stay (HOSPITAL_BASED_OUTPATIENT_CLINIC_OR_DEPARTMENT_OTHER): Payer: BC Managed Care – PPO | Admitting: Hematology

## 2021-03-03 VITALS — BP 162/80 | HR 69 | Temp 97.9°F | Resp 18 | Wt 168.4 lb

## 2021-03-03 DIAGNOSIS — Z0000 Encounter for general adult medical examination without abnormal findings: Secondary | ICD-10-CM

## 2021-03-03 DIAGNOSIS — R1110 Vomiting, unspecified: Secondary | ICD-10-CM | POA: Diagnosis not present

## 2021-03-03 DIAGNOSIS — R197 Diarrhea, unspecified: Secondary | ICD-10-CM | POA: Diagnosis not present

## 2021-03-03 DIAGNOSIS — D75839 Thrombocytosis, unspecified: Secondary | ICD-10-CM | POA: Insufficient documentation

## 2021-03-03 DIAGNOSIS — D473 Essential (hemorrhagic) thrombocythemia: Secondary | ICD-10-CM | POA: Diagnosis not present

## 2021-03-03 LAB — COMPREHENSIVE METABOLIC PANEL
ALT: 13 U/L (ref 0–44)
AST: 17 U/L (ref 15–41)
Albumin: 3.4 g/dL — ABNORMAL LOW (ref 3.5–5.0)
Alkaline Phosphatase: 75 U/L (ref 38–126)
Anion gap: 9 (ref 5–15)
BUN: 22 mg/dL (ref 8–23)
CO2: 27 mmol/L (ref 22–32)
Calcium: 9.6 mg/dL (ref 8.9–10.3)
Chloride: 103 mmol/L (ref 98–111)
Creatinine, Ser: 1.37 mg/dL — ABNORMAL HIGH (ref 0.44–1.00)
GFR, Estimated: 43 mL/min — ABNORMAL LOW (ref >60)
Glucose, Bld: 82 mg/dL (ref 70–99)
Potassium: 3.9 mmol/L (ref 3.5–5.1)
Sodium: 139 mmol/L (ref 135–145)
Total Bilirubin: 0.5 mg/dL (ref 0.3–1.2)
Total Protein: 6.6 g/dL (ref 6.5–8.1)

## 2021-03-03 LAB — CBC WITH DIFFERENTIAL/PLATELET
Abs Immature Granulocytes: 0.01 10*3/uL (ref 0.00–0.07)
Basophils Absolute: 0.1 10*3/uL (ref 0.0–0.1)
Basophils Relative: 1 %
Eosinophils Absolute: 0.2 10*3/uL (ref 0.0–0.5)
Eosinophils Relative: 3 %
HCT: 37.4 % (ref 36.0–46.0)
Hemoglobin: 12.4 g/dL (ref 12.0–15.0)
Immature Granulocytes: 0 %
Lymphocytes Relative: 27 %
Lymphs Abs: 1.7 10*3/uL (ref 0.7–4.0)
MCH: 28.8 pg (ref 26.0–34.0)
MCHC: 33.2 g/dL (ref 30.0–36.0)
MCV: 86.8 fL (ref 80.0–100.0)
Monocytes Absolute: 0.4 10*3/uL (ref 0.1–1.0)
Monocytes Relative: 6 %
Neutro Abs: 3.9 10*3/uL (ref 1.7–7.7)
Neutrophils Relative %: 63 %
Platelets: 439 10*3/uL — ABNORMAL HIGH (ref 150–400)
RBC: 4.31 MIL/uL (ref 3.87–5.11)
RDW: 15.6 % — ABNORMAL HIGH (ref 11.5–15.5)
WBC: 6.2 10*3/uL (ref 4.0–10.5)
nRBC: 0 % (ref 0.0–0.2)

## 2021-03-05 ENCOUNTER — Encounter: Payer: Self-pay | Admitting: Internal Medicine

## 2021-03-05 ENCOUNTER — Encounter: Payer: Self-pay | Admitting: Hematology

## 2021-03-05 DIAGNOSIS — K439 Ventral hernia without obstruction or gangrene: Secondary | ICD-10-CM | POA: Diagnosis not present

## 2021-03-08 ENCOUNTER — Telehealth: Payer: Self-pay | Admitting: *Deleted

## 2021-03-08 NOTE — Telephone Encounter (Signed)
Tried to reach pt to schedule a sooner appt for pre op, see notes from pre op provider today. Called pt but no answer or vm came on. Wanted to offer pt 04/09/21 @ 8:15 am. Could not leave message for pt to call back.

## 2021-03-08 NOTE — Telephone Encounter (Signed)
   Name: Sara [name]  DOB: 10-Feb-1956  MRN: [id number]  Primary Cardiologist: Sanda Klein, MD  Chart reviewed as part of pre-operative protocol coverage. Because of [name] past medical history and time since last visit, she will require a follow-up visit in order to better assess preoperative cardiovascular risk.  Has an appt with Dr. Sallyanne Kuster in August. Can we see if this can be moved up? If not, please schedule with APP and keep the Aug appt.  Pre-op covering staff: - Please schedule appointment and call patient to inform them. If patient already had an upcoming appointment within acceptable timeframe, please add "pre-op clearance" to the appointment notes so provider is aware. - Please contact requesting surgeon's office via preferred method (i.e, phone, fax) to inform them of need for appointment prior to surgery.  If applicable, this message will also be routed to pharmacy pool and/or primary cardiologist for input on holding anticoagulant/antiplatelet agent as requested below so that this information is available to the clearing provider at time of patient's appointment.   [place], [place]  03/08/2021, 12:46 PM

## 2021-03-08 NOTE — Telephone Encounter (Signed)
   [facility] Pre-operative Risk Assessment    Patient Name: Sara [name]  DOB: January 20, 1956  MRN: [id number]   HEARTCARE STAFF: - Please ensure there is not already an duplicate clearance open for this procedure. - Under Visit Info/Reason for Call, type in Other and utilize the format Clearance MM/DD/YY or Clearance TBD. Do not use dashes or single digits. - If request is for dental extraction, please clarify the # of teeth to be extracted.  Request for surgical clearance:  1. What type of surgery is being performed? Ventral hernia repair   2. When is this surgery scheduled? TBD   3. What type of clearance is required (medical clearance vs. Pharmacy clearance to hold med vs. Both)? medical  4. Are there any medications that need to be held prior to surgery and how long? N/A   5. Practice name and name of physician performing surgery? [facility] Dr. Kieth Brightly   6. What is the office phone number? [phone]   7.   What is the office fax number? [phone] attention: Altamese Cabal CMA  8.   Anesthesia type (None, local, MAC, general) ? general   [place] 03/08/2021, 11:24 AM  _________________________________________________________________   (provider comments below)

## 2021-03-08 NOTE — Telephone Encounter (Signed)
S/w pt and she is agreeable to sooner for pre op clearance. Pt has scheduled to see Almyra Deforest, [facility] 04/19/21 @ 8:45. Offered pt 6/17 but states she is supposed to be going to the beach with her daughter. Informed the pt while at appt 6/27, if it is felt that 05/26/21 appt with Dr. Sallyanne Kuster can be cancelled they will cancel it then. Pt is agreeable and thanked me for the call and the help. I will send clearance notes to [facility] for upcoming appt as well as FYI to requesting office. Pt asked to also be put on the waitlist for possible sooner appt.

## 2021-03-10 ENCOUNTER — Encounter: Payer: Self-pay | Admitting: Internal Medicine

## 2021-03-18 ENCOUNTER — Telehealth: Payer: Self-pay | Admitting: *Deleted

## 2021-03-18 ENCOUNTER — Encounter: Payer: Self-pay | Admitting: Internal Medicine

## 2021-03-18 ENCOUNTER — Other Ambulatory Visit: Payer: Self-pay

## 2021-03-18 ENCOUNTER — Ambulatory Visit (INDEPENDENT_AMBULATORY_CARE_PROVIDER_SITE_OTHER): Payer: Medicare Other | Admitting: Internal Medicine

## 2021-03-18 VITALS — BP 130/74 | HR 70 | Temp 98.1°F | Ht 62.0 in | Wt 166.0 lb

## 2021-03-18 DIAGNOSIS — E1122 Type 2 diabetes mellitus with diabetic chronic kidney disease: Secondary | ICD-10-CM

## 2021-03-18 DIAGNOSIS — I130 Hypertensive heart and chronic kidney disease with heart failure and stage 1 through stage 4 chronic kidney disease, or unspecified chronic kidney disease: Secondary | ICD-10-CM | POA: Diagnosis not present

## 2021-03-18 DIAGNOSIS — Z23 Encounter for immunization: Secondary | ICD-10-CM

## 2021-03-18 DIAGNOSIS — N1831 Chronic kidney disease, stage 3a: Secondary | ICD-10-CM | POA: Diagnosis not present

## 2021-03-18 DIAGNOSIS — L987 Excessive and redundant skin and subcutaneous tissue: Secondary | ICD-10-CM

## 2021-03-18 DIAGNOSIS — I700 Atherosclerosis of aorta: Secondary | ICD-10-CM

## 2021-03-18 DIAGNOSIS — I5032 Chronic diastolic (congestive) heart failure: Secondary | ICD-10-CM | POA: Diagnosis not present

## 2021-03-18 MED ORDER — SHINGRIX 50 MCG/0.5ML IM SUSR: 0.5000 mL | Freq: Once | INTRAMUSCULAR | 0 refills | Status: AC

## 2021-03-18 NOTE — Addendum Note (Signed)
Addended by: [name] on: 03/18/2021 10:27 PM   Modules accepted: Orders

## 2021-03-18 NOTE — Patient Instructions (Signed)
Diabetes Mellitus and Exercise Exercising regularly is important for overall health, especially for people who have diabetes mellitus. Exercising is not only about losing weight. It has many other health benefits, such as increasing muscle strength and bone density and reducing body fat and stress. This leads to improved fitness, flexibility, and endurance, all of which result in better overall health. What are the benefits of exercise if I have diabetes? Exercise has many benefits for people with diabetes. They include:  Helping to lower and control blood sugar (glucose).  Helping the body to respond better to the hormone insulin by improving insulin sensitivity.  Reducing how much insulin the body needs.  Lowering the risk for heart disease by: ? Lowering "bad" cholesterol and triglyceride levels. ? Increasing "good" cholesterol levels. ? Lowering blood pressure. ? Lowering blood glucose levels. What is my activity plan? Your health care provider or certified diabetes educator can help you make a plan for the type and frequency of exercise that works for you. This is called your activity plan. Be sure to:  Get at least 150 minutes of medium-intensity or high-intensity exercise each week. Exercises may include brisk walking, biking, or water aerobics.  Do stretching and strengthening exercises, such as yoga or weight lifting, at least 2 times a week.  Spread out your activity over at least 3 days of the week.  Get some form of physical activity each day. ? Do not go more than 2 days in a row without some kind of physical activity. ? Avoid being inactive for more than 90 minutes at a time. Take frequent breaks to walk or stretch.  Choose exercises or activities that you enjoy. Set realistic goals.  Start slowly and gradually increase your exercise intensity over time.   How do I manage my diabetes during exercise? Monitor your blood glucose  Check your blood glucose before and  after exercising. If your blood glucose is: ? 240 mg/dL (13.3 mmol/L) or higher before you exercise, check your urine for ketones. These are chemicals created by the liver. If you have ketones in your urine, do not exercise until your blood glucose returns to normal. ? 100 mg/dL (5.6 mmol/L) or lower, eat a snack containing 15-20 grams of carbohydrate. Check your blood glucose 15 minutes after the snack to make sure that your glucose level is above 100 mg/dL (5.6 mmol/L) before you start your exercise.  Know the symptoms of low blood glucose (hypoglycemia) and how to treat it. Your risk for hypoglycemia increases during and after exercise. Follow these tips and your health care provider's instructions  Keep a carbohydrate snack that is fast-acting for use before, during, and after exercise to help prevent or treat hypoglycemia.  Avoid injecting insulin into areas of the body that are going to be exercised. For example, avoid injecting insulin into: ? Your arms, when you are about to play tennis. ? Your legs, when you are about to go jogging.  Keep records of your exercise habits. Doing this can help you and your health care provider adjust your diabetes management plan as needed. Write down: ? Food that you eat before and after you exercise. ? Blood glucose levels before and after you exercise. ? The type and amount of exercise you have done.  Work with your health care provider when you start a new exercise or activity. He or she may need to: ? Make sure that the activity is safe for you. ? Adjust your insulin, other medicines, and food that   you eat.  Drink plenty of water while you exercise. This prevents loss of water (dehydration) and problems caused by a lot of heat in the body (heat stroke).   Where to find more information  American Diabetes Association: www.diabetes.org Summary  Exercising regularly is important for overall health, especially for people who have diabetes  mellitus.  Exercising has many health benefits. It increases muscle strength and bone density and reduces body fat and stress. It also lowers and controls blood glucose.  Your health care provider or certified diabetes educator can help you make an activity plan for the type and frequency of exercise that works for you.  Work with your health care provider to make sure any new activity is safe for you. Also work with your health care provider to adjust your insulin, other medicines, and the food you eat. This information is not intended to replace advice given to you by your health care provider. Make sure you discuss any questions you have with your health care provider. Document Revised: 07/08/2019 Document Reviewed: 07/08/2019 Elsevier Patient Education  2021 [employer].  

## 2021-03-18 NOTE — Progress Notes (Addendum)
I,Tianna Badgett,acting as a Education administrator for [name], MD.,have documented all relevant documentation on the behalf of [name], MD,as directed by  [name], MD while in the presence of [name], MD.  This visit occurred during the SARS-CoV-2 public health emergency.  Safety protocols were in place, including screening questions prior to the visit, additional usage of staff PPE, and extensive cleaning of exam room while observing appropriate contact time as indicated for disinfecting solutions.  Subjective:     Patient ID: Sara [name] , female    DOB: Sep 22, 1956 , 65 y.o.   MRN: [id number]   Chief Complaint  Patient presents with  . Diabetes  . Hypertension    HPI  She is here today for DM/HTN f/u.  She is not taking any meds for diabetes. She has been able to control her sugars with dietary changes.   Diabetes She presents for her follow-up diabetic visit. She has type 2 diabetes mellitus. There are no hypoglycemic associated symptoms. There are no diabetic associated symptoms. Pertinent negatives for diabetes include no blurred vision and no chest pain. There are no hypoglycemic complications. Diabetic complications include heart disease and nephropathy. Risk factors for coronary artery disease include diabetes mellitus, dyslipidemia, hypertension, obesity, post-menopausal and sedentary lifestyle. She is compliant with treatment most of the time. She is following a diabetic diet. She participates in exercise three times a week. Her breakfast blood glucose is taken between 7-8 am. Her breakfast blood glucose range is generally 90-110 mg/dl. An ACE inhibitor/angiotensin II receptor blocker is being taken. Eye exam is current.  Hypertension This is a chronic problem. The current episode started more than 1 year ago. The problem has been gradually improving since onset. The problem is uncontrolled. Pertinent negatives include no blurred vision, chest pain,  palpitations or shortness of breath. The current treatment provides moderate improvement. Compliance problems include exercise.  Hypertensive end-organ damage includes CAD/MI.     Past Medical History:  Diagnosis Date  . Diabetes mellitus   . Gout   . Hyperlipemia   . Hypertension   . LVH (left ventricular hypertrophy)    12/21/09 Echo -mild asymmetric LVH,EF =>55%  . Morbid obesity ([place])   . OSA (obstructive sleep apnea)   . PAD (peripheral artery disease) ([place])   . Renal artery stenosis ([place]) 12/13/2010   PTCA and Stent - right  . Seizures ([place])   . Syncopal episodes   . Thrombocytosis      Family History  Problem Relation Age of Onset  . Heart failure Mother   . Diabetes Father   . Diabetes Sister   . Hypertension Sister   . Hypertension Sister   . Hypertension Sister   . Breast cancer Maternal Aunt        2 maternal aunts had breast cancer.  . Breast cancer Maternal Grandmother      Current Outpatient Medications:  .  anagrelide ([name]) 1 MG capsule, TAKE 1 CAPSULE BY MOUTH DAILY (Patient taking differently: Take 1-1.5 mg by mouth See admin instructions. Take 1 mg tablet by mouth every day except on Mon, wednesdays and Fridays take 1.5mg  tablet per patient), Disp: 30 capsule, Rfl: 5 .  aspirin EC 81 MG tablet, Take 1 tablet (81 mg total) by mouth daily. Swallow whole. (Patient taking differently: Take 81 mg by mouth daily.), Disp: 150 tablet, Rfl: 2 .  atorvastatin (LIPITOR) 40 MG tablet, TAKE 1 TABLET BY MOUTH DAILY AT 6 PM, Disp: 90 tablet,  Rfl: 2 .  B Complex Vitamins (B COMPLEX PO), Take 1 tablet by mouth daily. , Disp: , Rfl:  .  Cholecalciferol (VITAMIN D3) 2000 UNITS capsule, Take 2,000 Units by mouth daily. , Disp: , Rfl:  .  Lacosamide ([name]) 100 MG TABS, Take 1 tablet by mouth See admin instructions. Take 100 mg tablet by mouth in the mornings then take 150 mg tablet by mouth in the evening. Patient states she splits the 100 mg in half to make the total dose  of 150 mg in the evening per patient, Disp: , Rfl:  .  Lacosamide ([name]) 150 MG TABS, Take one tablet in PM, Disp: 14 tablet, Rfl: 0 .  metoprolol tartrate (LOPRESSOR) 50 MG tablet, Take 1.5 tablets (75 mg total) by mouth 2 (two) times daily., Disp: 90 tablet, Rfl: 3 .  Multiple Vitamin (MULTIVITAMIN) capsule, Take 1 capsule by mouth daily., Disp: , Rfl:  .  Specialty Vitamins Products (MAGNESIUM, AMINO ACID CHELATE,) 133 MG tablet, Take 1 tablet by mouth 2 (two) times daily., Disp: , Rfl:  .  telmisartan (MICARDIS) 80 MG tablet, TAKE 1 TABLET BY MOUTH EVERY DAY (Patient taking differently: Take 80 mg by mouth daily.), Disp: 90 tablet, Rfl: 1 .  Zoster Vaccine Adjuvanted [facility]) injection, Inject 0.5 mLs into the muscle once for 1 dose., Disp: 0.5 mL, Rfl: 0   Allergies  Allergen Reactions  . Sulfa Antibiotics Itching     Review of Systems  Constitutional: Negative.   Eyes: Negative for blurred vision.  Respiratory: Negative.  Negative for shortness of breath.   Cardiovascular: Negative.  Negative for chest pain and palpitations.  Gastrointestinal: Negative.   Neurological: Negative.      Today's Vitals   03/18/21 1148  BP: 130/74  Pulse: 70  Temp: 98.1 F (36.7 C)  TempSrc: Oral  Weight: 166 lb (75.3 kg)  Height: 5\' 2"  (1.575 m)   Body mass index is 30.36 kg/m.   Wt Readings from Last 3 Encounters:  03/18/21 166 lb (75.3 kg)  03/03/21 168 lb 6.4 oz (76.4 kg)  02/04/21 171 lb (77.6 kg)    Objective:  Physical Exam Vitals and nursing note reviewed.  Constitutional:      Appearance: Normal appearance. She is obese.  HENT:     Head: Normocephalic and atraumatic.     Nose:     Comments: Masked     Mouth/Throat:     Comments: Masked  Eyes:     Extraocular Movements: Extraocular movements intact.  Cardiovascular:     Rate and Rhythm: Normal rate and regular rhythm.     Pulses:          Dorsalis pedis pulses are 2+ on the right side and 2+ on the left side.      Heart sounds: Normal heart sounds.  Pulmonary:     Effort: Pulmonary effort is normal.     Breath sounds: Normal breath sounds.  Musculoskeletal:     Cervical back: Normal range of motion.  Feet:     Right foot:     Protective Sensation: 5 sites tested. 5 sites sensed.     Skin integrity: Dry skin present.     Toenail Condition: Right toenails are normal.     Left foot:     Protective Sensation: 5 sites tested. 5 sites sensed.     Skin integrity: Dry skin present.     Toenail Condition: Left toenails are normal.     Comments: R bunion Skin:  General: Skin is warm.  Neurological:     General: No focal deficit present.     Mental Status: She is alert.  Psychiatric:        Mood and Affect: Mood normal.        Behavior: Behavior normal.         Assessment And Plan:     1. Type 2 diabetes mellitus with stage 3a chronic kidney disease, without long-term current use of insulin (HCC) Comments: Chronic, diabetic foot exam performed today. I will check an a1c today. I will also refer her for CCM mgmt.  She agrees with her treatment plan.  - Hemoglobin A1c - AMB Referral to [place]  2. Hypertensive heart and renal disease with renal failure, stage 1 through stage 4 or unspecified chronic kidney disease, with heart failure ([place]) Comments: Chronic, controlled. Advised to follow low sodium diet.  She will c/w telmisartan 80mg  and metoprolol 75 bid.  - AMB Referral to [place]  3. Chronic diastolic congestive heart failure ([place]) Comments: She appears to be euvolemic. Importance of dietary compliance was discussed with the patient.   4. Atherosclerosis of aorta (HCC) Chronic, encouraged to follow heart healthy lifestyle. Clean eating, regular exercise and stress management are all a part of this lifestyle. She is also encouraged to continue with statin therapy.   - AMB Referral to [place]  5. Excess skin of upper  extremity Comments: I will refer her to Plastics as requested.  - Ambulatory referral to Plastic Surgery  6. Immunization due Comments: I will send rx Shingrix to her local pharmacy.    Patient was given opportunity to ask questions. Patient verbalized understanding of the plan and was able to repeat key elements of the plan. All questions were answered to their satisfaction.   I, [name], MD, have reviewed all documentation for this visit. The documentation on 03/18/21 for the exam, diagnosis, procedures, and orders are all accurate and complete.   IF YOU HAVE BEEN REFERRED TO A SPECIALIST, IT MAY TAKE 1-2 WEEKS TO SCHEDULE/PROCESS THE REFERRAL. IF YOU HAVE NOT HEARD FROM US/SPECIALIST IN TWO WEEKS, PLEASE GIVE [place] A CALL AT [phone].   THE PATIENT IS ENCOURAGED TO PRACTICE SOCIAL DISTANCING DUE TO THE COVID-19 PANDEMIC.

## 2021-03-18 NOTE — Chronic Care Management (AMB) (Signed)
  Chronic Care Management   Note  03/18/2021 Name: Sara [name] MRN: [id number] DOB: [birth year]  Sara [name] is a 65 y.o. year old female who is a primary care patient of [name], MD. I reached out to Sara [name] by phone today in response to a referral sent by Ms. Barrett Shell PCP, Dr. Baird Cancer.      [name] was given information about Chronic Care Management services today including:  1. CCM service includes personalized support from designated clinical staff supervised by her physician, including individualized plan of care and coordination with other care providers 2. 24/7 contact phone numbers for assistance for urgent and routine care needs. 3. Service will only be billed when office clinical staff spend 20 minutes or more in a month to coordinate care. 4. Only one practitioner may furnish and bill the service in a calendar month. 5. The patient may stop CCM services at any time (effective at the end of the month) by phone call to the office staff. 6. The patient will be responsible for cost sharing (co-pay) of up to 20% of the service fee (after annual deductible is met).  Patient agreed to services and verbal consent obtained.   Follow up plan: Telephone appointment with care management team member scheduled for:04/02/2021 and 04/21/2021  [place] [facility]

## 2021-03-19 LAB — HEMOGLOBIN A1C
Est. average glucose Bld gHb Est-mCnc: 123 mg/dL
Hgb A1c MFr Bld: 5.9 % — ABNORMAL HIGH (ref 4.8–5.6)

## 2021-03-29 ENCOUNTER — Telehealth: Payer: Self-pay

## 2021-03-29 NOTE — Chronic Care Management (AMB) (Addendum)
    Chronic Care Management Pharmacy Assistant   Name: Sara [name]  MRN: [id number] DOB: 11/10/1955  Reason for Encounter: Patient Assistance Coordination   03/29/2021- Patient assistance application filled out for [name] 100/150 mg with [employer] Patient assistance program and [name] 1 mg with [employer] Patient assistance program. [employer] patient assistance program application enrollment was completed online with patient consent/HIPAA consent, patient aware she will receive a phone call in 2 days with determination. Printed out application for [employer] program, patient will come to PCP office 03/30/2021 at 11 am to sign application and provide income documentation. Completed form will be faxed.  [name], CPP notified.  Medications: Outpatient Encounter Medications as of 03/29/2021  Medication Sig  . anagrelide ([name]) 1 MG capsule TAKE 1 CAPSULE BY MOUTH DAILY (Patient taking differently: Take 1-1.5 mg by mouth See admin instructions. Take 1 mg tablet by mouth every day except on Mon, wednesdays and Fridays take 1.5mg tablet per patient)  . aspirin EC 81 MG tablet Take 1 tablet (81 mg total) by mouth daily. Swallow whole. (Patient taking differently: Take 81 mg by mouth daily.)  . atorvastatin (LIPITOR) 40 MG tablet TAKE 1 TABLET BY MOUTH DAILY AT 6 PM  . B Complex Vitamins (B COMPLEX PO) Take 1 tablet by mouth daily.   . Cholecalciferol (VITAMIN D3) 2000 UNITS capsule Take 2,000 Units by mouth daily.   . Lacosamide ([name]) 100 MG TABS Take 1 tablet by mouth See admin instructions. Take 100 mg tablet by mouth in the mornings then take 150 mg tablet by mouth in the evening. Patient states she splits the 100 mg in half to make the total dose of 150 mg in the evening per patient  . Lacosamide ([name]) 150 MG TABS Take one tablet in PM  . metoprolol tartrate (LOPRESSOR) 50 MG tablet Take 1.5 tablets (75 mg total) by mouth 2 (two) times daily.  . Multiple Vitamin (MULTIVITAMIN) capsule Take 1  capsule by mouth daily.  . Specialty Vitamins Products (MAGNESIUM, AMINO ACID CHELATE,) 133 MG tablet Take 1 tablet by mouth 2 (two) times daily.  . telmisartan (MICARDIS) 80 MG tablet TAKE 1 TABLET BY MOUTH EVERY DAY (Patient taking differently: Take 80 mg by mouth daily.)   No facility-administered encounter medications on file as of 03/29/2021.    Star Rating Drugs: Atorvastatin 40 mg- Last filled 12/27/2020 for 90 day supply at [facility] Ozempic- Last filled 06/26/2019 for 28 day supply at [facility]- NO LONGER TAKING Telmisartan 80 mg- Last filled 12/28/2020 [facility]  SIG:  , CMA Clinical Pharmacist Assistant [phone]  03/30/2021- Met with patient at PCP office, explained forms, patient signed application, faxed application to [employer] for [name] 1 mg. Patient aware she will receive a call from [employer] with more information.  

## 2021-04-02 ENCOUNTER — Telehealth: Payer: BC Managed Care – PPO

## 2021-04-05 ENCOUNTER — Ambulatory Visit: Payer: BC Managed Care – PPO | Admitting: Nurse Practitioner

## 2021-04-06 ENCOUNTER — Encounter: Payer: Self-pay | Admitting: Adult Health

## 2021-04-07 ENCOUNTER — Telehealth: Payer: Self-pay | Admitting: Adult Health

## 2021-04-07 MED ORDER — LACOSAMIDE 100 MG PO TABS: ORAL_TABLET | ORAL | 11 refills | Status: DC

## 2021-04-07 MED ORDER — LACOSAMIDE 150 MG PO TABS: ORAL_TABLET | ORAL | 11 refills | Status: DC

## 2021-04-07 NOTE — Telephone Encounter (Signed)
I called [employer], [employer].  Spoke to [place].   Need pg 3 of form and prescription ;of [name] signed by MD.  Will complete and then fax.  [place] drug registry checked last fill 02-25-21 #[id number] vimpat 100mg  tabs at [facility].

## 2021-04-07 NOTE — Telephone Encounter (Signed)
Patient Assistance Program Sara [name]) called, we need a new prescription for [name] and Patient Assistance form section 3 filled out by physician. Would like a call from the nurse to verify physician.

## 2021-04-08 NOTE — Telephone Encounter (Signed)
Fax confirmation received from [facility].

## 2021-04-14 ENCOUNTER — Telehealth: Payer: Self-pay

## 2021-04-14 NOTE — Chronic Care Management (AMB) (Signed)
Chronic Care Management Pharmacy Assistant   Name: Sara [name]  MRN: [id number] DOB: [birth year]  Reason for Encounter: Chart Review for CPP visit on 04/21/2021.   Conditions to be addressed/monitored: CHF, CAD, HTN, HLD, DMII, CKD Stage 3, and Seizures , Essential Thrombocythemia   Recent office visits:  03/29/2021- CCM- [name] and [name] PAP created.  03/18/2021- Sara Chard, MD (PCP) Patient present for DM/HTN f/u visit .  Type 2 diabetes mellitus with stage 3a chronic kidney disease, without long-term current use of insulin (HCC) Chronic, diabetic foot exam performed today. I will check an a1c today. I will also refer her for CCM mgmt.  She agrees with her treatment plan. Hemoglobin A1c ordered. AMB Referral to [facility]. Hypertensive heart and renal disease with renal failure, stage 1 through stage 4 or unspecified chronic kidney disease, with heart failure (HCC) Chronic, controlled. Advised to follow low sodium diet.  She will c/w telmisartan 57m and metoprolol 75 bid. Chronic diastolic congestive heart failure ([place] She appears to be euvolemic. Importance of dietary compliance was discussed with the patient. Atherosclerosis of aorta (HCC) Chronic, encouraged to follow heart healthy lifestyle. Clean eating, regular exercise and stress management are all a part of this lifestyle. She is also encouraged to continue with statin therapy.  Excess skin of upper extremity Ambulatory referral to Plastic Surgery Immunization due-Shingrix rx sent to her local pharmacy.  Follow up in 3 months, PCP visit scheduled 07/19/2021  02/04/2021- RGlendale Chard MD (PCP) Patient present for hospital f/u.  She was recently hospitalized from 4/1-4/7 for SBO. Small bowel obstruction (HCC) Resolving.  Discharge summary reviewed in full detail. She is encouraged to keep upcoming appt with Gen. Surgery. Other elevated white blood cell (WBC) count She had leukocytosis while in  hospital. I will recheck CBC today. Hyponatremia I will check BMP today. Hypertensive heart and renal disease with renal failure, stage 1 through stage 4 or unspecified chronic kidney disease, with heart failure (HCC) Chronic, uncontrolled. Encouraged to take meds as prescribed and to follow low sodium diet. Stage 3a chronic kidney disease ([place] ordered Chronic diastolic congestive heart failure (HCC Chronic, again importance of maintaining good BP control and dietary compliance was discussed with the patient. Ventral hernia without obstruction or gangrene She plans to see Surgeon for f/u within 2 weeks.  01/19/2021- Sara [name] (PCP)- Patient presents for an acute office visit with Nausea Patient stated she has been vomiting since this morning. She has been unable to keep anything down. She stated last night her stomach was hurting. Patient's blood sugar is 143.  Non-intractable vomiting with nausea, unspecified vomiting type Blood sugar with LElenor Legatois 142, check for covid due to the vomiting and feeling bad, She is encouraged to drink clear liquids and broth while nauseated and vomiting.This is likely gastritis. Ondansetron 4 mg- 1 tablet daily prn started. CBC, BMP, Novel Coronavirus labs ordered. Type 2 diabetes mellitus with stage 3 chronic kidney disease, without long-term current use of insulin, unspecified whether stage 3a or 3b CKD (HCary GVOKE education given and she is to use if her blood sugar drops below 60, she is to call office if blood sugar is greater than 150. [name] 0.5 MG/0.1ML SOAJ; Inject 1 each into the skin as needed prescribed.  Return if symptoms worsen or fail to improve.  01/14/2021- RGlendale Chard MD (PCP) Patient present for BP/dm f/u. Hypertensive heart and renal disease with renal failure, stage 1 through stage 4  or unspecified chronic kidney disease, with heart failure (HCC) Chronic, fair control. Advised optimal control  is less than 130/80. Advised to follow low sodium diet and to aim for at least 150 min of exercise per week.  Type 2 diabetes mellitus with stage 3a chronic kidney disease, without long-term current use of insulin ([place]) She is now off of meds.  Chronic diastolic congestive heart failure ([place]) She appears euvolemic. Again, importance of following low sodium diet was d/w patient. Seizures ([place])  Asymptomatic, no new seizures since last visit. SVT (supraventricular tachycardia) ([place]) She is rate controlled. May beneift from magnesium supplementation. Labs ordered:  Magnesium, TSH,  CMP14+EGFR Return in about 5 weeks (around 02/18/2021), or f/u [name], for BPC/DM check.  11/04/2020- Sara Chard, MD (PCP) Patient present for Medicare Annual Wellness Exam Encounter for Medicare annual wellness exam Comments:  The annual wellness visit was performed including discussion of advanced directives, assessment of functional status and cognitive function.  A full exam was performed. Importance of monthly self breast exams was stressed to the patient. EKG performed, NSR w/o acute changes. Type 2 diabetes mellitus with stage 3 chronic kidney disease, without long-term current use of insulin, unspecified whether stage 3a or 3b CKD ([place]) Diabetic foot exam was NOT performed. Her hba1c has been controlled without meds. She was congratulated on maintaining her lifestyle changes Hypertensive heart and renal disease with renal failure, stage 1 through stage 4 or unspecified chronic kidney disease, with heart failure (HCC) Chronic, well controlled. EKG performed, NSR w/o acute changes. She is encouraged to follow low sodium diet.  She is encouraged to aim for at least 150 minutes of exercise per week.  She will f/u in six months. Chronic diastolic congestive heart failure (HCC) Chronic, yet stable. Encouraged to follow low sodium diet. Renal artery stenosis (HCC) s/p renal artery stent placement Seizures  (HCC) Chronic, she has h/o absence seizures. She denies recent epileptic activity Need for vaccination- She was given high dose flu vaccine Left lower extremity edema Lower ext doppler results from 10/28/20 reviewed in detail: ruptured baker's cyst in popliteal fossa extending to proximal calf. No DVT She understands to wear compression hose and elevate leg when seated.  Labs ordered:  -POCT Urinalysis Dipstick (81002) - POCT UA - Microalbumin - CMP14+EGFR - Lipid panel - Hemoglobin A1c   Recent consult visits:  04/07/2021- Ward Givens, NP (Neurology)- Telephone Encounter Lacosamide modification- 100 mg 1 tablet in the am and 150 mg 1 tablet in the PM.   03/03/2021- Truitt Merle, MD (Oncology)- Patient presents for F/u of Essential Thrombocytosis Essential Thrombocythemia. JAK2 (+)  Diagnosed in 2010, JAK2 mutation was present. She was on Hydrea previously, had episodes of dizziness and syncope, and she was concerned about the risk of leukemia, so it was stopped. The patient is being treated with aspirin and Anagrelide 39m in AM and on MWF add 0.548min the PM.  -She was off Anagrelide for 7 days in April 2022 due to significant bowl obstruction. She restarted Anagrelide at 10m58maily only since discharge from 01/28/21. She is tolerating well. -Labs reviewed, plt did slightly increase on lower dose to 439K. I discussed if her platelets increase above 500K, will increase Anagrelide dose. Continue Anagrelide 10mg29mily for now.  H/o of Seizure, Recurrent Syncope episodes Her Syncope have unclear etiology.She has a heart monitor, continue meds, follow up with her neurologist. Diabetes, HTN, Arthritis, CAD -She has been seeing a nutritionist at church.She monitors her sugar level regularly. She has  recent arthritis in her left knee. Coronary artery disease status post bypass surgery grafting x 2 by Dr. Cyndia Bent in June 2018. Continue to f/u with cardiologist and PCP  Continue aspirin. Lab in 3 and 6  months and Follow up in 6 month.  02/11/2021- Santiago Bumpers, MD (Nephrology) [facility] CKD, Stage III, Follow up in 6 months- See visit note in media.   11/26/2020- Ward Givens, NP (Neurology) [facility], Patient presents for follow-up visit. Seizures  Continue [name] 100 mg in the morning and 150 mg in the evening, Advised that her events at night seem less likely to be seizures.  I agree with her PCP and that she should check her sugar to ensure that she is not hypoglycemic. Advised if she has any seizure event she should let us know. We will check a CPAP report at the next visit Follow-up in 6 months or sooner if needed  11/16/2020- [facility] Telephone Encounter Lacosamide modified 100 mg changed 1 tablet in the am and 150 mg to 1 tablet pm  10/28/2020- Cira Rue, NP (Oncology) Patient presents for follow up visit for thrombocytosis.  Left leg edema, pain -she developed acute LLE swelling and pain in 09/2020, she is on anagrelide which can cause edema -DVT risk factor includes thrombocytosis and h/o bypass grafting in 2018. -STAT doppler today shows ruptured baker's cyst in popliteal fossa extending to proximal calf. No DVT -we discussed symptom management including elevation, compression stockings, etc.  Essential Thrombocytosis -CBC reviewed, WNL -continue anagrelide 1 mg daily plus additional 0.5 mg MWF -lab 11/2020 and lab/follow up 02/2021 as scheduled. -Lab, doppler reviewed -No DVT, supportive care for ruptured bakers cyst -Continue anagrelide as prescribed   Hospital visits:  Medication Reconciliation was completed by comparing discharge summary, patient's EMR and Pharmacy list, and upon discussion with patient.  Admitted to the hospital on 01/22/2021 due to Severe Bowel Obstruction, Acute Renal Failure, N/V/D, Leukocytosis, Hyponatremia, Hypokalemia, Dehyrdration, Lethargic, Hypotension. Discharge date was 01/28/2021.  Discharged from [facility].  01/22/2021- [facility] checked in (19 mins) transferred to [place]?Medications Started at [facility] Discharge:?? -started None  Medication Changes at Hospital Discharge: -Changed None  Medications Discontinued at Hospital Discharge: -Stopped None  Medications that remain the same after Hospital Discharge:??  -All other medications will remain the same.    04/20/2021- Unable to speak with patient for initial questions but left message reminding of appointment, date and time.     Medications: Outpatient Encounter Medications as of 04/14/2021  Medication Sig   anagrelide ([name]) 1 MG capsule TAKE 1 CAPSULE BY MOUTH DAILY (Patient taking differently: Take 1-1.5 mg by mouth See admin instructions. Take 1 mg tablet by mouth every day except on Mon, wednesdays and Fridays take 1.$RemoveBefore'5mg'[id number]$  tablet per patient)   aspirin EC 81 MG tablet Take 1 tablet (81 mg total) by mouth daily. Swallow whole. (Patient taking differently: Take 81 mg by mouth daily.)   atorvastatin (LIPITOR) 40 MG tablet TAKE 1 TABLET BY MOUTH DAILY AT 6 PM   B Complex Vitamins (B COMPLEX PO) Take 1 tablet by mouth daily.    Cholecalciferol (VITAMIN D3) 2000 UNITS capsule Take 2,000 Units by mouth daily.    Lacosamide ([name]) 100 MG TABS Take 1 tablet by mouth See admin instructions. Take 100 mg tablet by mouth in the mornings then take 150 mg tablet by mouth in the evening. Patient states she splits the 100 mg  in half to make the total dose of 150 mg in the evening per patient   Lacosamide ([name]) 100 MG TABS Take one tablet in AM   Lacosamide ([name]) 150 MG TABS Take one tablet in PM   Lacosamide ([name]) 150 MG TABS Take one tablet in PM   metoprolol tartrate (LOPRESSOR) 50 MG tablet Take 1.5 tablets (75 mg total) by mouth 2 (two) times daily.   Multiple Vitamin (MULTIVITAMIN) capsule Take 1 capsule by mouth daily.   Specialty Vitamins Products  (MAGNESIUM, AMINO ACID CHELATE,) 133 MG tablet Take 1 tablet by mouth 2 (two) times daily.   telmisartan (MICARDIS) 80 MG tablet TAKE 1 TABLET BY MOUTH EVERY DAY (Patient taking differently: Take 80 mg by mouth daily.)   No facility-administered encounter medications on file as of 04/14/2021.    Care Gaps: 03/18/2021-Shingrix sent to her local pharmacy.  03/18/2021- DM foot exam 11/04/2020- Annual Wellness Exam- 11/04/2021, next Annual Wellness visit.   Star Rating Drugs: Telmisartan 80 mg- Last filled 03/28/2021 for 90 day supply at [facility] Atorvastatin 40 mg- Last filled 03/28/2021 for 90 day supply at [facility] 0.25/0.5 mg- Last filled 06/26/2019 for 28 day supply at [facility] (Patient no longer taking)   SIG: Sara [name], [place] Pharmacist Assistant [phone]

## 2021-04-17 ENCOUNTER — Encounter: Payer: Self-pay | Admitting: Internal Medicine

## 2021-04-19 ENCOUNTER — Telehealth: Payer: Self-pay

## 2021-04-19 ENCOUNTER — Other Ambulatory Visit: Payer: Self-pay

## 2021-04-19 ENCOUNTER — Ambulatory Visit (INDEPENDENT_AMBULATORY_CARE_PROVIDER_SITE_OTHER): Payer: HMO | Admitting: Physician Assistant

## 2021-04-19 VITALS — BP 190/92 | HR 66 | Ht 62.0 in | Wt 166.0 lb

## 2021-04-19 DIAGNOSIS — Z01818 Encounter for other preprocedural examination: Secondary | ICD-10-CM | POA: Diagnosis not present

## 2021-04-19 DIAGNOSIS — I2510 Atherosclerotic heart disease of native coronary artery without angina pectoris: Secondary | ICD-10-CM | POA: Diagnosis not present

## 2021-04-19 DIAGNOSIS — I10 Essential (primary) hypertension: Secondary | ICD-10-CM

## 2021-04-19 DIAGNOSIS — E119 Type 2 diabetes mellitus without complications: Secondary | ICD-10-CM

## 2021-04-19 DIAGNOSIS — E785 Hyperlipidemia, unspecified: Secondary | ICD-10-CM

## 2021-04-19 DIAGNOSIS — I701 Atherosclerosis of renal artery: Secondary | ICD-10-CM

## 2021-04-19 NOTE — Progress Notes (Signed)
Cardiology Office Note:    Date:  04/21/2021   ID:  Sara [name], DOB 04/01/1956, MRN [id number]  PCP:  Sara Chard, MD   [facility] Providers Cardiologist:  Sara Klein, MD     Referring MD: Sara Chard, MD   Chief Complaint  Patient presents with   Follow-up    Seen for Dr. Sallyanne [name]    History of Present Illness:    Sara [name] is a 65 y.o. female with a hx of CAD, ICM, hypertension, hyperlipidemia, DM 2, history of a renal artery stent, morbid obesity, obstructive sleep apnea on CPAP therapy, seizures and essential thrombocythemia.  Patient had NSTEMI in April 15, 2017 and eventually underwent two-vessel CABG on June 29 with LIMA to LAD and SVG to diagonal.  EF was 45 to 50% at the time.  Patient is planning for upcoming ventral hernia repair by Dr. Alvino [name] of [facility] surgery.  Since the last visit, patient was admitted in early April 2022 with nausea, vomiting and diarrhea.  She was noted to have progressive lethargy for few days prior to admission and found to be hypotensive at her urgent care.  KUB showed SBO.  This was confirmed on CT imaging as well.  Lab work also revealed significant AKI with creatinine up to 6.  General surgery was consulted and recommended conservative management.  SBO did self resolve was conservative therapy.  AKI also improved prior to discharge as well was creatinine improved to 1.3.  It was felt that AKI was likely prerenal from dehydration.  She is being followed by neurology for seizure and is currently on [name].  More recently, while on vacation near [place] on over the weekend, she was seen in the [facility] ED for generalized seizure.  EMS also reviewed reported brief postictal period.  Pulse ox was 99% on room air.  Sodium borderline low at 132, potassium 3.7, creatinine 1.19.  White [name] cell count and hemoglobin all normal.  Urinalysis negative.  Patient presents today for follow-up.  She  just took her morning [name] pressure medication prior to arrival.  [name] pressure was 190/92, I rechecked the [name] pressure manually myself, [name] pressure remained 190/100.  However when she saw her PCP a month ago, [name] pressure was actually 130/74.  She just returned from her vacation at the [facility].  While there, she was exercising and walking on a daily basis.  She had no problem accomplish more than 4 METS of activity.  As far as her ventral hernia repair, I think she is cleared from the cardiac perspective.  However we need to make sure her [name] pressure does come down.  I recommended a nursing visit next Wednesday, if her systolic [name] pressure does come down to less than 160 mmHg, she is cleared to proceed with surgery.  If it is still high, we will need to adjust her telmisartan dosage.  Otherwise she denies any recent chest pain or worsening dyspnea.    Past Medical History:  Diagnosis Date   Diabetes mellitus    Gout    Hyperlipemia    Hypertension    LVH (left ventricular hypertrophy)    12/21/09 Echo -mild asymmetric LVH,EF =>55%   Morbid obesity (HCC)    OSA (obstructive sleep apnea)    PAD (peripheral artery disease) ([place])    Renal artery stenosis ([place]) 12/13/2010   PTCA and Stent - right   Seizures (HCC)    Syncopal episodes  Thrombocytosis     Past Surgical History:  Procedure Laterality Date   BREAST CYST EXCISION     S/P Benign Right Breast Cyst Removal.   BREAST REDUCTION SURGERY  1995   S/P Bilateral breast reduction   CESAREAN SECTION  1980, 1986   Times  Two.   CORONARY ARTERY BYPASS GRAFT N/A 04/21/2017   Procedure: CORONARY ARTERY BYPASS GRAFTING (CABG) x 2, USING LEFT MAMMARY ARTERY AND RIGHT GREATER SAPHENOUS VEIN HARVESTED ENDOSCOPICALLY;  Surgeon: [name], MD;  Location: [place];  Service: Open Heart Surgery;  Laterality: N/A;   EXTREMITY CYST EXCISION  1993   left wrist   LEFT HEART CATH AND CORONARY ANGIOGRAPHY N/A 04/17/2017   Procedure:  Left Heart Cath and Coronary Angiography;  Surgeon: Jettie Booze, MD;  Location: [place] [facility];  Service: Cardiovascular;  Laterality: N/A;   PARTIAL HYSTERECTOMY  1994   renal artery stent placement Right    TEE WITHOUT CARDIOVERSION N/A 04/21/2017   Procedure: TRANSESOPHAGEAL ECHOCARDIOGRAM (TEE);  Surgeon: [name], MD;  Location: [place];  Service: Open Heart Surgery;  Laterality: N/A;    Current Medications: Current Meds  Medication Sig   anagrelide ([name]) 1 MG capsule TAKE 1 CAPSULE BY MOUTH DAILY   aspirin EC 81 MG tablet Take 1 tablet (81 mg total) by mouth daily. Swallow whole.   atorvastatin (LIPITOR) 40 MG tablet TAKE 1 TABLET BY MOUTH DAILY AT 6 PM   B Complex Vitamins (B COMPLEX PO) Take 1 tablet by mouth daily.  (Patient not taking: Reported on 04/21/2021)   Cholecalciferol (VITAMIN D3) 2000 UNITS capsule Take 2,000 Units by mouth daily.    Lacosamide ([name]) 100 MG TABS Take one tablet in AM   Lacosamide ([name]) 150 MG TABS Take one tablet in PM   metoprolol tartrate (LOPRESSOR) 50 MG tablet Take 1.5 tablets (75 mg total) by mouth 2 (two) times daily.   Multiple Vitamin (MULTIVITAMIN) capsule Take 1 capsule by mouth daily.   Specialty Vitamins Products (MAGNESIUM, AMINO ACID CHELATE,) 133 MG tablet Take 1 tablet by mouth 2 (two) times daily.   telmisartan (MICARDIS) 80 MG tablet TAKE 1 TABLET BY MOUTH EVERY DAY   [DISCONTINUED] Lacosamide ([name]) 100 MG TABS Take 1 tablet by mouth See admin instructions. Take 100 mg tablet by mouth in the mornings then take 150 mg tablet by mouth in the evening. Patient states she splits the 100 mg in half to make the total dose of 150 mg in the evening per patient     Allergies:   Sulfa antibiotics   Social History   Socioeconomic History   Marital status: Married    Spouse name: Sara [name]   Number of children: 2   Years of education: 16   Highest education level: Not on file  Occupational History   Occupation:  TRANSPORTATION COOR.    Employer: [employer]  Tobacco Use   Smoking status: Never   Smokeless tobacco: Never  Vaping Use   Vaping Use: Never used  Substance and Sexual Activity   Alcohol use: No    Alcohol/week: 0.0 standard drinks   Drug use: No   Sexual activity: Yes  Other Topics Concern   Not on file  Social History Narrative   Patient lives at husband and her son, home with family.   Patient has two adult children.   Patient is not drinking any caffeine.   Patient is working full-time.   Patient has a college education.  Patient is right-handed.   Social Determinants of Health   Financial Resource Strain: Low Risk    Difficulty of Paying Living Expenses: Not hard at all  Food Insecurity: No Food Insecurity   Worried About Charity fundraiser in the Last Year: Never true   [place] in the Last Year: Never true  Transportation Needs: No Transportation Needs   Lack of Transportation (Medical): No   Lack of Transportation (Non-Medical): No  Physical Activity: Insufficiently Active   Days of Exercise per Week: 2 days   Minutes of Exercise per Session: 30 min  Stress: No Stress Concern Present   Feeling of Stress : Not at all  Social Connections: Socially Integrated   Frequency of Communication with Friends and Family: More than three times a week   Frequency of Social Gatherings with Friends and Family: Not on file   Attends Religious Services: More than 4 times per year   Active Member of [employer] or Organizations: Yes   Attends Music therapist: More than 4 times per year   Marital Status: Married     Family History: The patient's family history includes Breast cancer in her maternal aunt and maternal grandmother; Diabetes in her father and sister; Heart failure in her mother; Hypertension in her sister, sister, and sister.  ROS:   Please see the history of present illness.     All other systems reviewed and are  negative.  EKGs/Labs/Other Studies Reviewed:    The following studies were reviewed today:  N/A  EKG:  EKG is ordered today.  The ekg ordered today demonstrates normal sinus rhythm, no significant ST-T wave changes, poor R wave question in the anterior leads.  Recent Labs: 01/22/2021: B Natriuretic Peptide 376.8; TSH 0.845 01/27/2021: Magnesium 2.2 03/03/2021: ALT 13; BUN 22; Creatinine, Ser 1.37; Hemoglobin 12.4; Platelets 439; Potassium 3.9; Sodium 139  Recent Lipid Panel    Component Value Date/Time   CHOL 111 11/04/2020 1628   TRIG 39 11/04/2020 1628   HDL 47 11/04/2020 1628   CHOLHDL 2.4 11/04/2020 1628   CHOLHDL 2.7 04/17/2017 1000   VLDL 8 04/17/2017 1000   LDLCALC 54 11/04/2020 1628     Risk Assessment/Calculations:           Physical Exam:    VS:  BP (!) 190/92   Pulse 66   Ht 5\' 2"  (1.575 m)   Wt 166 lb (75.3 kg)   SpO2 98%   BMI 30.36 kg/m     Wt Readings from Last 3 Encounters:  04/19/21 166 lb (75.3 kg)  03/18/21 166 lb (75.3 kg)  03/03/21 168 lb 6.4 oz (76.4 kg)     GEN:  Well nourished, well developed in no acute distress HEENT: Normal NECK: No JVD; No carotid bruits LYMPHATICS: No lymphadenopathy CARDIAC: RRR, no murmurs, rubs, gallops RESPIRATORY:  Clear to auscultation without rales, wheezing or rhonchi  ABDOMEN: Soft, non-tender, non-distended MUSCULOSKELETAL:  No edema; No deformity  SKIN: Warm and dry NEUROLOGIC:  Alert and oriented x 3 PSYCHIATRIC:  Normal affect   ASSESSMENT:    1. Preop examination   2. Coronary artery disease involving native coronary artery of native heart without angina pectoris   3. Essential hypertension   4. Hyperlipidemia LDL goal <70   5. Controlled type 2 diabetes mellitus without complication, without long-term current use of insulin ([place])   6. Renal artery stenosis (right) s/p stent 2012    PLAN:    In order of  problems listed above:  Preoperative clearance: Upcoming hernia repair.  She denies any  chest pain or worsening dyspnea.  She has no problem accomplish more than 4 METS of activity.  From the cardiac perspective.  She is cleared to proceed with surgery.  However, her [name] pressure is quite elevated, I recommend a nursing visit next Wednesday to try to bring down her [name] pressure before finally clear her.  Interestingly, her [name] pressure during the previous visit in May was normal.  If [name] pressure still elevated at the next visit, will uptitrate telmisartan.  CAD: Status post CABG in 2018.  Denies any recent chest pain  Hypertension: [name] pressure elevated today, however it was normal during the previous visit.  We will reassess next Wednesday during nursing visit  Hyperlipidemia: On Lipitor  DM2: Managed by primary care provider  History of renal artery stenosis s/p stenting in 2012.        Medication Adjustments/Labs and Tests Ordered: Current medicines are reviewed at length with the patient today.  Concerns regarding medicines are outlined above.  Orders Placed This Encounter  Procedures   EKG 12-Lead   No orders of the defined types were placed in this encounter.   Patient Instructions  Medication Instructions:  Your physician recommends that you continue on your current medications as directed. Please refer to the Current Medication list given to you today.  *If you need a refill on your cardiac medications before your next appointment, please call your pharmacy*  Lab Work: NONE ordered at this time of appointment   If you have labs ([name] work) drawn today and your tests are completely normal, you will receive your results only by: [place] (if you have MyChart) OR A paper copy in the mail If you have any lab test that is abnormal or we need to change your treatment, we will call you to review the results.  Testing/Procedures: NONE ordered at this time of appointment   Follow-Up: At [facility], you and your health needs are our  priority.  As part of our continuing mission to provide you with exceptional heart care, we have created designated Provider Care Teams.  These Care Teams include your primary Cardiologist (physician) and Advanced Practice Providers (APPs -  Physician Assistants and Nurse Practitioners) who all work together to provide you with the care you need, when you need it.  Your next appointment:   As scheduled-05/26/21 at 8:40 AM   The format for your next appointment:   In Person  Provider:   Sanda Klein, MD  Other Instructions You are schedule for a BP nurse visit on 04/28/21 at 11:15    Signed, Almyra Deforest, [place]  04/21/2021 7:01 PM    [place]

## 2021-04-19 NOTE — Telephone Encounter (Signed)
Please inquire with the patient if she has missed any medication, been sick, sleep deprivation?  Please verify what medication she is actually taking and how she is taking it.  If there was no trigger for the seizure we can get her in for an office visit with me or the MD

## 2021-04-19 NOTE — Patient Instructions (Signed)
Medication Instructions:  Your physician recommends that you continue on your current medications as directed. Please refer to the Current Medication list given to you today.  *If you need a refill on your cardiac medications before your next appointment, please call your pharmacy*  Lab Work: NONE ordered at this time of appointment   If you have labs (blood work) drawn today and your tests are completely normal, you will receive your results only by: [place] (if you have MyChart) OR A paper copy in the mail If you have any lab test that is abnormal or we need to change your treatment, we will call you to review the results.  Testing/Procedures: NONE ordered at this time of appointment   Follow-Up: At [facility], you and your health needs are our priority.  As part of our continuing mission to provide you with exceptional heart care, we have created designated Provider Care Teams.  These Care Teams include your primary Cardiologist (physician) and Advanced Practice Providers (APPs -  Physician Assistants and Nurse Practitioners) who all work together to provide you with the care you need, when you need it.  Your next appointment:   As scheduled-05/26/21 at 8:40 AM   The format for your next appointment:   In Person  Provider:   Sanda Klein, MD  Other Instructions You are schedule for a BP nurse visit on 04/28/21 at 11:15

## 2021-04-19 NOTE — Telephone Encounter (Signed)
Patient advised to call neurologist due to increased seizure activity. Pt agrees

## 2021-04-20 ENCOUNTER — Telehealth: Payer: Self-pay

## 2021-04-20 NOTE — Chronic Care Management (AMB) (Signed)
  Patient aware of telephone appointment with [name] CPP on 04-20-2021 at 1:15. Patient aware to have/bring all medications, supplements, blood pressure and/or blood sugar logs to visit. Patient was unable to complete appointment reminder questions because she was busy in the store.   [place] Pharmacist Assistant [phone]

## 2021-04-20 NOTE — Telephone Encounter (Signed)
If patient is amenable we can increase her [name] to 150 mg twice a day

## 2021-04-21 ENCOUNTER — Ambulatory Visit (INDEPENDENT_AMBULATORY_CARE_PROVIDER_SITE_OTHER): Payer: HMO

## 2021-04-21 ENCOUNTER — Encounter: Payer: Self-pay | Admitting: Internal Medicine

## 2021-04-21 ENCOUNTER — Encounter: Payer: Self-pay | Admitting: Physician Assistant

## 2021-04-21 DIAGNOSIS — I130 Hypertensive heart and chronic kidney disease with heart failure and stage 1 through stage 4 chronic kidney disease, or unspecified chronic kidney disease: Secondary | ICD-10-CM | POA: Diagnosis not present

## 2021-04-21 DIAGNOSIS — I700 Atherosclerosis of aorta: Secondary | ICD-10-CM

## 2021-04-21 DIAGNOSIS — G4089 Other seizures: Secondary | ICD-10-CM

## 2021-04-21 NOTE — Progress Notes (Signed)
Chronic Care Management Pharmacy Note  04/27/2021 Name:  Sara [name] MRN:  [id number] DOB:  [birth year]  Summary: Patient reports that she is concerned about her BP readings.   Recommendations/Changes made from today's visit: Recommend patient follow up with the cardiologist and PCP team about her high BP readings. Recommend patient continue current medication regimen  Plan: Collaborated with PCP team, patient to follow up with cardiologist with current BP readings. Patient agreed with this plan.  Will have CPA follow up with patient at least every week to determine how patients BP readings are going.    Subjective: Sara [name] is an 65 y.o. year old female who is a primary patient of Sara Chard, MD.  The CCM team was consulted for assistance with disease management and care coordination needs.    Engaged with patient by telephone for initial visit in response to provider referral for pharmacy case management and/or care coordination services. She is retired from [employer] and and was there for over 30 years. She is the oldest of five, and she started taking care of her brothers and sisters. In high school she moved and stayed with her aunt to help her children. She went to [facility] for a year and then she came back to [place]. She has two children one daughter went on to [employer]. Her daughter is married and lives in [place].   Consent to Services:  The patient was given information about Chronic Care Management services, agreed to services, and gave verbal consent prior to initiation of services.  Please see initial visit note for detailed documentation.   Patient Care Team: Sara Chard, MD as PCP - General (Internal Medicine) Croitoru, Dani Gobble, MD as PCP - Cardiology (Cardiology) Sara [name], [facility] (Pharmacist) Sara Logan, RN as [facility]  03/29/2021- CCM- [name] and [name] PAP created.   03/18/2021- Sara Chard, MD (PCP)  Patient present for DM/HTN f/u visit .  Type 2 diabetes mellitus with stage 3a chronic kidney disease, without long-term current use of insulin (HCC) Chronic, diabetic foot exam performed today. I will check an a1c today. I will also refer her for CCM mgmt.  She agrees with her treatment plan. Hemoglobin A1c ordered. AMB Referral to [facility]. Hypertensive heart and renal disease with renal failure, stage 1 through stage 4 or unspecified chronic kidney disease, with heart failure (HCC) Chronic, controlled. Advised to follow low sodium diet.  She will c/w telmisartan $RemoveBeforeD'80mg'hTIHhjCgXhkeOZ$  and metoprolol 75 bid. Chronic diastolic congestive heart failure ([name]) She appears to be euvolemic. Importance of dietary compliance was discussed with the patient. Atherosclerosis of aorta (HCC) Chronic, encouraged to follow heart healthy lifestyle. Clean eating, regular exercise and stress management are all a part of this lifestyle. She is also encouraged to continue with statin therapy.  Excess skin of upper extremity Ambulatory referral to Plastic Surgery Immunization due-Shingrix rx sent to her local pharmacy.  Follow up in 3 months, PCP visit scheduled 07/19/2021   02/04/2021- Sara Chard, MD (PCP) Patient present for hospital f/u.  She was recently hospitalized from 4/1-4/7 for SBO. Small bowel obstruction (HCC) Resolving.  Discharge summary reviewed in full detail. She is encouraged to keep upcoming appt with Gen. Surgery. Other elevated white blood cell (WBC) count She had leukocytosis while in hospital. I will recheck CBC today. Hyponatremia I will check BMP today. Hypertensive heart and renal disease with renal failure, stage 1 through stage 4 or unspecified chronic kidney disease, with heart  failure (HCC) Chronic, uncontrolled. Encouraged to take meds as prescribed and to follow low sodium diet. Stage 3a chronic kidney disease ([name] ordered Chronic diastolic congestive heart  failure (HCC Chronic, again importance of maintaining good BP control and dietary compliance was discussed with the patient. Ventral hernia without obstruction or gangrene She plans to see Surgeon for f/u within 2 weeks.   01/19/2021- Sara Brine, FNP (PCP)- Patient presents for an acute office visit with Nausea Patient stated she has been vomiting since this morning. She has been unable to keep anything down. She stated last night her stomach was hurting. Patient's blood sugar is 143. Non-intractable vomiting with nausea, unspecified vomiting type Blood sugar with Sara [name] is 142, check for covid due to the vomiting and feeling bad, She is encouraged to drink clear liquids and broth while nauseated and vomiting.This is likely gastritis. Ondansetron 4 mg- 1 tablet daily prn started. CBC, BMP, Novel Coronavirus labs ordered. Type 2 diabetes mellitus with stage 3 chronic kidney disease, without long-term current use of insulin, unspecified whether stage 3a or 3b CKD ([name]) GVOKE education given and she is to use if her blood sugar drops below 60, she is to call office if blood sugar is greater than 150. Sara [name] (GVOKE HYPOPEN 2-PACK) 0.5 MG/0.1ML SOAJ; Inject 1 each into the skin as needed prescribed.  Return if symptoms worsen or fail to improve.   01/14/2021- Sara Chard, MD (PCP) Patient present for BP/dm f/u. Hypertensive heart and renal disease with renal failure, stage 1 through stage 4 or unspecified chronic kidney disease, with heart failure (HCC) Chronic, fair control. Advised optimal control is less than 130/80. Advised to follow low sodium diet and to aim for at least 150 min of exercise per week.  Type 2 diabetes mellitus with stage 3a chronic kidney disease, without long-term current use of insulin ([name]) She is now off of meds.  Chronic diastolic congestive heart failure ([place]) She appears euvolemic. Again, importance of following low sodium diet was d/w patient. Seizures ([name])   Asymptomatic, no new seizures since last visit. SVT (supraventricular tachycardia) ([name]) She is rate controlled. May beneift from magnesium supplementation. Labs ordered:  Magnesium, TSH,  CMP14+EGFR Return in about 5 weeks (around 02/18/2021), or f/u Sara [name], for BPC/DM check.   11/04/2020- Sara Chard, MD (PCP) Patient present for Medicare Annual Wellness Exam Encounter for Medicare annual wellness exam Comments:  The annual wellness visit was performed including discussion of advanced directives, assessment of functional status and cognitive function.  A full exam was performed. Importance of monthly self breast exams was stressed to the patient. EKG performed, NSR w/o acute changes. Type 2 diabetes mellitus with stage 3 chronic kidney disease, without long-term current use of insulin, unspecified whether stage 3a or 3b CKD ([place]) Diabetic foot exam was NOT performed. Her hba1c has been controlled without meds. She was congratulated on maintaining her lifestyle changes Hypertensive heart and renal disease with renal failure, stage 1 through stage 4 or unspecified chronic kidney disease, with heart failure (HCC) Chronic, well controlled. EKG performed, NSR w/o acute changes. She is encouraged to follow low sodium diet.  She is encouraged to aim for at least 150 minutes of exercise per week.  She will f/u in six months. Chronic diastolic congestive heart failure (HCC) Chronic, yet stable. Encouraged to follow low sodium diet. Renal artery stenosis (HCC) s/p renal artery stent placement Seizures (HCC) Chronic, she has h/o absence seizures. She denies recent epileptic activity Need for vaccination- She was  given high dose flu vaccine Left lower extremity edema Lower ext doppler results from 10/28/20 reviewed in detail: ruptured baker's cyst in popliteal fossa extending to proximal calf. No DVT She understands to wear compression hose and elevate leg when seated.  Labs ordered: -POCT Urinalysis  Dipstick (81002) - POCT UA - Microalbumin - CMP14+EGFR - Lipid panel - Hemoglobin A1c     Recent consult visits:  04/07/2021- Ward Givens, NP (Neurology)- Telephone Encounter Lacosamide modification- 100 mg 1 tablet in the am and 150 mg 1 tablet in the PM.   03/03/2021- Truitt Merle, MD (Oncology)- Patient presents for F/u of Essential Thrombocytosis Essential Thrombocythemia. JAK2 (+)  Diagnosed in 2010, JAK2 mutation was present. She was on Hydrea previously, had episodes of dizziness and syncope, and she was concerned about the risk of leukemia, so it was stopped. The patient is being treated with aspirin and Anagrelide 1mg  in AM and on MWF add 0.5mg  in the PM.  -She was off Anagrelide for 7 days in April 2022 due to significant bowl obstruction. She restarted Anagrelide at 1mg  daily only since discharge from 01/28/21. She is tolerating well. -Labs reviewed, plt did slightly increase on lower dose to 439K. I discussed if her platelets increase above 500K, will increase Anagrelide dose. Continue Anagrelide 1mg  daily for now.  H/o of Seizure, Recurrent Syncope episodes Her Syncope have unclear etiology.She has a heart monitor, continue meds, follow up with her neurologist. Diabetes, HTN, Arthritis, CAD -She has been seeing a nutritionist at church.She monitors her sugar level regularly. She has recent arthritis in her left knee. Coronary artery disease status post bypass surgery grafting x 2 by Dr. Cyndia Bent in June 2018. Continue to f/u with cardiologist and PCP  Continue aspirin. Lab in 3 and 6 months and Follow up in 6 month.   02/11/2021- Santiago Bumpers, MD (Nephrology) [facility] CKD, Stage III, Follow up in 6 months- See visit note in media.   11/26/2020- Ward Givens, NP (Neurology) [facility], Patient presents for follow-up visit. Seizures  Continue [name] 100 mg in the morning and 150 mg in the evening, Advised that her events at night seem less likely  to be seizures.  I agree with her PCP and that she should check her sugar to ensure that she is not hypoglycemic. Advised if she has any seizure event she should let us know. We will check a CPAP report at the next visit Follow-up in 6 months or sooner if needed   11/16/2020- [facility] Telephone Encounter Lacosamide modified 100 mg changed 1 tablet in the am and 150 mg to 1 tablet pm   10/28/2020- Cira Rue, NP (Oncology) Patient presents for follow up visit for thrombocytosis.  Left leg edema, pain -she developed acute LLE swelling and pain in 09/2020, she is on anagrelide which can cause edema -DVT risk factor includes thrombocytosis and h/o bypass grafting in 2018. -STAT doppler today shows ruptured baker's cyst in popliteal fossa extending to proximal calf. No DVT -we discussed symptom management including elevation, compression stockings, etc.  Essential Thrombocytosis -CBC reviewed, WNL -continue anagrelide 1 mg daily plus additional 0.5 mg MWF -lab 11/2020 and lab/follow up 02/2021 as scheduled. -Lab, doppler reviewed -No DVT, supportive care for ruptured bakers cyst -Continue anagrelide as prescribed     Hospital visits:  Medication Reconciliation was completed by comparing discharge summary, patient's EMR and Pharmacy list, and upon discussion with patient.   Admitted to the hospital on 01/22/2021 due to Severe Bowel  Obstruction, Acute Renal Failure, N/V/D, Leukocytosis, Hyponatremia, Hypokalemia, Dehyrdration, Lethargic, Hypotension. Discharge date was 01/28/2021. Discharged from [facility].  01/22/2021- [facility] checked in (19 mins) transferred to [place]?Medications Started at [facility] Discharge:?? -started None   Medication Changes at Hospital Discharge: -Changed None   Medications Discontinued at Hospital Discharge: -Stopped None   Medications that remain the same after Hospital  Discharge:?? -All other medications will remain the same.    Objective:  Lab Results  Component Value Date   CREATININE 1.37 (H) 03/03/2021   BUN 22 03/03/2021   GFRNONAA 43 (L) 03/03/2021   GFRAA 49 (L) 11/16/2020   NA 139 03/03/2021   K 3.9 03/03/2021   CALCIUM 9.6 03/03/2021   CO2 27 03/03/2021   GLUCOSE 82 03/03/2021    Lab Results  Component Value Date/Time   HGBA1C 5.9 (H) 03/18/2021 12:28 PM   HGBA1C 6.1 (H) 11/04/2020 04:28 PM   MICROALBUR 10 11/04/2020 05:22 PM   MICROALBUR 80 03/13/2019 12:10 PM    Last diabetic Eye exam:  Lab Results  Component Value Date/Time   HMDIABEYEEXA No Retinopathy 04/14/2020 12:00 AM    Last diabetic Foot exam: No results found for: HMDIABFOOTEX   Lab Results  Component Value Date   CHOL 111 11/04/2020   HDL 47 11/04/2020   LDLCALC 54 11/04/2020   TRIG 39 11/04/2020   CHOLHDL 2.4 11/04/2020    Hepatic Function Latest Ref Rng & Units 03/03/2021 01/22/2021 01/14/2021  Total Protein 6.5 - 8.1 g/dL 6.6 6.3(L) 7.0  Albumin 3.5 - 5.0 g/dL 3.4(L) 2.8(L) 4.3  AST 15 - 41 U/L $Remo'17 25 24  'CoyLg$ ALT 0 - 44 U/L $Remo'13 22 20  'rjJmx$ Alk Phosphatase 38 - 126 U/L 75 67 92  Total Bilirubin 0.3 - 1.2 mg/dL 0.5 1.0 0.4    Lab Results  Component Value Date/Time   TSH 0.845 01/22/2021 05:45 PM   TSH 1.650 01/14/2021 03:21 PM   TSH 2.060 11/22/2018 05:14 PM   FREET4 1.24 11/22/2018 05:14 PM    CBC Latest Ref Rng & Units 03/03/2021 02/04/2021 01/28/2021  WBC 4.0 - 10.5 K/uL 6.2 6.1 16.8(H)  Hemoglobin 12.0 - 15.0 g/dL 12.4 12.0 12.1  Hematocrit 36.0 - 46.0 % 37.4 35.3 35.7(L)  Platelets 150 - 400 K/uL 439(H) 695(H) 363    No results found for: VD25OH  Clinical ASCVD: Yes  The ASCVD Risk score Mikey Bussing DC Jr., et al., 2013) failed to calculate for the following reasons:   The patient has a prior MI or stroke diagnosis    Depression screen [facility] 2/9 11/04/2020 09/02/2019 03/13/2019  Decreased Interest 0 0 0  Down, Depressed, Hopeless 0 0 0  PHQ - 2 Score 0 0 0   Altered sleeping - - -  Tired, decreased energy - - -  Change in appetite - - -  Feeling bad or failure about yourself  - - -  Trouble concentrating - - -  Moving slowly or fidgety/restless - - -  Suicidal thoughts - - -  PHQ-9 Score - - -  Difficult doing work/chores - - -  Some recent data might be hidden     Social History   Tobacco Use  Smoking Status Never  Smokeless Tobacco Never   BP Readings from Last 3 Encounters:  04/22/21 (!) 160/90  04/19/21 (!) 190/92  03/18/21 130/74   Pulse Readings from Last 3 Encounters:  04/22/21 86  04/19/21 66  03/18/21  70   Wt Readings from Last 3 Encounters:  04/22/21 165 lb 12.8 oz (75.2 kg)  04/19/21 166 lb (75.3 kg)  03/18/21 166 lb (75.3 kg)   BMI Readings from Last 3 Encounters:  04/22/21 30.33 kg/m  04/19/21 30.36 kg/m  03/18/21 30.36 kg/m    Assessment/Interventions: Review of patient past medical history, allergies, medications, health status, including review of consultants reports, laboratory and other test data, was performed as part of comprehensive evaluation and provision of chronic care management services.   SDOH:  (Social Determinants of Health) assessments and interventions performed: Yes  SDOH Screenings   Alcohol Screen: Low Risk    Last Alcohol Screening Score (AUDIT): 0  Depression (PHQ2-9): Low Risk    PHQ-2 Score: 0  Financial Resource Strain: Low Risk    Difficulty of Paying Living Expenses: Not hard at all  Food Insecurity: No Food Insecurity   Worried About Charity fundraiser in the Last Year: Never true   Ran Out of Food in the Last Year: Never true  Housing: Low Risk    Last Housing Risk Score: 0  Physical Activity: Insufficiently Active   Days of Exercise per Week: 2 days   Minutes of Exercise per Session: 30 min  Social Connections: Engineer, building services of Communication with Friends and Family: More than three times a week   Frequency of Social Gatherings with Friends  and Family: Not on file   Attends Religious Services: More than 4 times per year   Active Member of [employer] or Organizations: Yes   Attends Music therapist: More than 4 times per year   Marital Status: Married  Stress: No Stress Concern Present   Feeling of Stress : Not at all  Tobacco Use: Low Risk    Smoking Tobacco Use: Never   Smokeless Tobacco Use: Never  Transportation Needs: No Transportation Needs   Lack of Transportation (Medical): No   Lack of Transportation (Non-Medical): No    CCM Care Plan  Allergies  Allergen Reactions   Sulfa Antibiotics Itching    Medications Reviewed Today     Reviewed by Sara Brine, FNP (Family Nurse Practitioner) on 04/22/21 at 1046  Med List Status: <None>   Medication Order Taking? Sig Documenting Provider Last Dose Status Informant  amLODipine (NORVASC) 2.5 MG tablet [id number] Yes Take 1 tablet (2.5 mg total) by mouth daily. Sara Brine, FNP  Active   anagrelide Arsenio Loader) 1 MG capsule [id number] Yes TAKE 1 CAPSULE BY MOUTH DAILY Truitt Merle, MD Taking Active   aspirin EC 81 MG tablet [id number] Yes Take 1 tablet (81 mg total) by mouth daily. Swallow whole. Sara Chard, MD Taking Active   atorvastatin (LIPITOR) 40 MG tablet [id number] Yes TAKE 1 TABLET BY MOUTH DAILY AT 6 PM Croitoru, Mihai, MD Taking Active Self  B Complex Vitamins (B COMPLEX PO) [id number] No Take 1 tablet by mouth daily.   Patient not taking: No sig reported   [provider] Not Taking Active   Cholecalciferol (VITAMIN D3) 2000 UNITS capsule [id number] Yes Take 2,000 Units by mouth daily.  [provider] Taking Active Self  Lacosamide ([name]) 100 MG TABS [id number] Yes Take one tablet in AM Marcial Pacas, MD Taking Active   Lacosamide ([name]) 150 MG TABS [id number] Yes Take one tablet in PM Marcial Pacas, MD Taking Active   metoprolol tartrate (LOPRESSOR) 50 MG tablet [id number] Yes Take 1.5 tablets (75 mg total) by mouth 2 (two) times daily.  Croitoru,  Mihai, MD Taking Active Self  Multiple Vitamin (MULTIVITAMIN) capsule [id number] Yes Take 1 capsule by mouth daily. [provider] Taking Active Self  Specialty Vitamins Products (MAGNESIUM, AMINO ACID CHELATE,) 133 MG tablet [id number] Yes Take 1 tablet by mouth 2 (two) times daily. [provider] Taking Active Self  telmisartan (MICARDIS) 80 MG tablet [id number] Yes TAKE 1 TABLET BY MOUTH EVERY DAY Sara Chard, MD Taking Active   Med List Note Hart Rochester, CPhT 01/05/14 2012): CPAP            Patient Active Problem List   Diagnosis Date Noted   Hypertensive heart and renal disease 04/22/2021   Acute renal failure superimposed on stage 3a chronic kidney disease ([place]) 01/22/2021   Hyponatremia 01/22/2021   Ischemic cardiomyopathy 01/22/2021   Hypokalemia 01/22/2021   Nausea vomiting and diarrhea    SVT (supraventricular tachycardia) ([place]) 11/04/2020   Nephropathy 07/28/2018   Chronic kidney disease, stage 2 (mild) 07/28/2018   History of renal artery stenosis 05/16/2018   CAD s/p CABG  05/16/2018   S/P CABG x 2 04/21/2017   Elevated troponin    NSTEMI (non-ST elevated myocardial infarction) ([place])    Chest pain 04/15/2017   ACS (acute coronary syndrome) ([place]) 04/15/2017   Seizures ([place]) 01/19/2016   Typical absence seizures 03/28/2014   Sleep apnea with use of continuous positive airway pressure (CPAP) 01/30/2014   Hypercholesterolemia 01/04/2014   OSA (obstructive sleep apnea) 10/28/2013   Syncope 07/21/2013   Type 2 diabetes mellitus ([place]) 07/20/2013   Acute renal failure ([place]) 07/20/2013   History of renal stent 07/20/2013   Hypotension due to hypovolemia 07/20/2013   Chronic diastolic CHF (congestive heart failure) ([place]) 07/20/2013   Essential thrombocythemia ([place]) 04/19/2012   Hypertension 04/19/2012   Renal artery stenosis (right) s/p stent 2012 12/13/2010    Immunization History  Administered Date(s) Administered   Fluad  Quad(high Dose 65+) 11/04/2020   Influenza,inj,Quad PF,6+ Mos 07/09/2019   PFIZER(Purple Top)SARS-COV-2 Vaccination 11/30/2019, 12/23/2019, 08/10/2020   Tdap 03/13/2019    Conditions to be addressed/monitored:  Hypertension, Hyperlipidemia, and Coronary Artery Disease  Care Plan : CCM Pharmacy Care Plan  Updates made by Sara [name], [place] since 04/27/2021 12:00 AM     Problem: HTN, HLD, ATHEROSCLEROSIS, SEIZURES   Priority: High     Long-Range Goal: Disease Management   This Visit's Progress: On track  Priority: High  Note:      Current Barriers:  Unable to independently monitor therapeutic efficacy  Pharmacist Clinical Goal(s):  Patient will achieve adherence to monitoring guidelines and medication adherence to achieve therapeutic efficacy through collaboration with PharmD and provider.   Interventions: 1:1 collaboration with Sara Chard, MD regarding development and update of comprehensive plan of care as evidenced by provider attestation and co-signature Inter-disciplinary care team collaboration (see longitudinal plan of care) Comprehensive medication review performed; medication list updated in electronic medical record  Hypertension (BP goal <140/90) -Uncontrolled -Current treatment: Telmisartan 80 mg taking 1 tablet by mouth everyday Metoprolol tartrate 50 mg table taking 1.5 tablets by mouth two times daily -Current home readings: 177/85, 11 PM - 180/80  -Patient reported that she has been having these elevated readings since she was seen by the cardiologist.  -Current dietary habits: patient reports that she is a chicken lover, and she eats a lot of grilled food -Current exercise habits: she has been doing a lot of walking, she sometimes walks 10-20 minutes per day  -Denies hypotensive/hypertensive symptoms -Educated on Daily  salt intake goal < 2300 mg; Importance of home blood pressure monitoring; Proper BP monitoring technique; -Going to the  cardiologist 04/28/2021  -Counseled to monitor BP at home at least 6 times per week, document, and provide log at future appointments -Consulted with Dr. Baird Cancer who recommended the patient follow up with the cardiologist with her BP readings.  -Recommended to continue current medication -Collaborated with patient and recommend that she contact the cardiologist, patient agreed to contact the cardiologist.   Atherosclerosis of AortaHyperlipidemia: (LDL goal < 70) -Controlled -Current treatment: Aspirin 81 mg tablet once per day Atorvastatin 40 mg tablet taking it daily -Current dietary patterns: will discuss further during next office visit -Current exercise habits: will discuss further during next office visit -Educated on Cholesterol goals;  Benefits of statin for ASCVD risk reduction; Importance of limiting foods high in cholesterol; Exercise goal of 150 minutes per week; -Recommended to continue current medication  Seizure  -Not ideally controlled -Current treatment  [name] 100 mg taking 1 tablet in the morning [name] 150 mg taking 1 tablet at bedtime -Patient reports having a seizure [place] she was [place]  -She is concerned  -Recommended to continue current medication   Health Maintenance -Vaccine gaps:     - Shingrix Vaccine    - Pneumonia Vaccine    - COVID-19 Booster  Patient Goals/Self-Care Activities Patient will:  - take medications as prescribed  Follow Up Plan: The patient has been provided with contact information for the care management team and has been advised to call with any health related questions or concerns.       Medication Assistance: None required.  Patient affirms current coverage meets needs.  Compliance/Adherence/Medication fill history: Care Gaps: COVID-19 Vaccine  Zoster Vaccine Opthamology Exam  Pneumonia Vaccine    Star-Rating Drugs: Atorvastatin 40 mg Telmisartan 80 mg  Patient's preferred  pharmacy is:  [facility] [phone] - [place], [place] - [address] [address] Phone: [phone] Fax: [phone]  [place], [place] - [address] [place] [address] Phone: [phone] Fax: [phone]  Uses pill box? Yes Pt endorses 89% compliance  We discussed: Benefits of medication synchronization, packaging and delivery as well as enhanced pharmacist oversight with [employer]. Patient decided to: Continue current medication management strategy  Care Plan and Follow Up Patient Decision:  Patient agrees to Care Plan and Follow-up.  Plan: The patient has been provided with contact information for the care management team and has been advised to call with any health related questions or concerns.   [name], PharmD Clinical Pharmacist [facility] [phone]

## 2021-04-22 ENCOUNTER — Ambulatory Visit (INDEPENDENT_AMBULATORY_CARE_PROVIDER_SITE_OTHER): Payer: HMO | Admitting: Nurse Practitioner

## 2021-04-22 ENCOUNTER — Encounter: Payer: Self-pay | Admitting: Nurse Practitioner

## 2021-04-22 ENCOUNTER — Other Ambulatory Visit: Payer: Self-pay

## 2021-04-22 VITALS — BP 160/90 | HR 86 | Temp 98.5°F | Ht 62.0 in | Wt 165.8 lb

## 2021-04-22 DIAGNOSIS — I130 Hypertensive heart and chronic kidney disease with heart failure and stage 1 through stage 4 chronic kidney disease, or unspecified chronic kidney disease: Secondary | ICD-10-CM

## 2021-04-22 DIAGNOSIS — N1831 Chronic kidney disease, stage 3a: Secondary | ICD-10-CM | POA: Diagnosis not present

## 2021-04-22 DIAGNOSIS — I1310 Hypertensive heart and chronic kidney disease without heart failure, with stage 1 through stage 4 chronic kidney disease, or unspecified chronic kidney disease: Secondary | ICD-10-CM | POA: Insufficient documentation

## 2021-04-22 DIAGNOSIS — I10 Essential (primary) hypertension: Secondary | ICD-10-CM | POA: Diagnosis not present

## 2021-04-22 DIAGNOSIS — R569 Unspecified convulsions: Secondary | ICD-10-CM | POA: Diagnosis not present

## 2021-04-22 MED ORDER — AMLODIPINE BESYLATE 2.5 MG PO TABS: 2.5000 mg | ORAL_TABLET | Freq: Every day | ORAL | 2 refills | Status: DC

## 2021-04-22 NOTE — Progress Notes (Signed)
I,Yamilka Roman [employer] as a Education administrator for [employer], FNP.,have documented all relevant documentation on the behalf of [name], FNP,as directed by  [name], FNP while in the presence of [name], [place].  This visit occurred during the SARS-CoV-2 public health emergency.  Safety protocols were in place, including screening questions prior to the visit, additional usage of staff PPE, and extensive cleaning of exam room while observing appropriate contact time as indicated for disinfecting solutions.  Subjective:     Patient ID: Sara [name] , female    DOB: October 18, 1956 , 65 y.o.   MRN: [id number]   Chief Complaint  Patient presents with   Hypertension    HPI  She is here today for blood pressure f/u. [place] and when she came back she had a seizure unsure of how long. She went to the hospital and her bp was up. She went to the cardiologist said to monitor her bp for this week and record it along with her heart rate. Her bp has been still running high.   She took her blood pressure this morning before medications - 163/84. She took her blood pressure medication at 9 am. She is to record and keep a record with the blood pressure until July 6th for the Cardiologist. It has improved from 190 down to 160's. She has taken amlodipine in the past.      Hypertension This is a chronic problem. The current episode started more than 1 year ago. The problem has been gradually improving since onset. The problem is uncontrolled. Pertinent negatives include no blurred vision, chest pain, headaches, palpitations or shortness of breath. The current treatment provides moderate improvement. Compliance problems include exercise.  Hypertensive end-organ damage includes CAD/MI.  Diabetes She presents for her follow-up diabetic visit. She has type 2 diabetes mellitus. There are no hypoglycemic associated symptoms. Pertinent negatives for hypoglycemia  include no dizziness or headaches. There are no diabetic associated symptoms. Pertinent negatives for diabetes include no blurred vision, no chest pain and no fatigue. There are no hypoglycemic complications. Diabetic complications include heart disease and nephropathy. Risk factors for coronary artery disease include diabetes mellitus, dyslipidemia, hypertension, obesity, post-menopausal and sedentary lifestyle. She is compliant with treatment most of the time. She is following a diabetic diet. She participates in exercise three times a week. Her breakfast blood glucose is taken between 7-8 am. Her breakfast blood glucose range is generally 90-110 mg/dl. An ACE inhibitor/angiotensin II receptor blocker is being taken. Eye exam is current.    Past Medical History:  Diagnosis Date   Diabetes mellitus    Gout    Hyperlipemia    Hypertension    LVH (left ventricular hypertrophy)    12/21/09 Echo -mild asymmetric LVH,EF =>55%   Morbid obesity (HCC)    OSA (obstructive sleep apnea)    PAD (peripheral artery disease) (HCC)    Renal artery stenosis ([place]) 12/13/2010   PTCA and Stent - right   Seizures (HCC)    Syncopal episodes    Thrombocytosis      Family History  Problem Relation Age of Onset   Heart failure Mother    Diabetes Father    Diabetes Sister    Hypertension Sister    Hypertension Sister    Hypertension Sister    Breast cancer Maternal Aunt        2 maternal aunts had breast cancer.   Breast cancer Maternal Grandmother  Current Outpatient Medications:    amLODipine (NORVASC) 2.5 MG tablet, Take 1 tablet (2.5 mg total) by mouth daily., Disp: 30 tablet, Rfl: 2   anagrelide ([name]) 1 MG capsule, TAKE 1 CAPSULE BY MOUTH DAILY, Disp: 30 capsule, Rfl: 5   aspirin EC 81 MG tablet, Take 1 tablet (81 mg total) by mouth daily. Swallow whole., Disp: 150 tablet, Rfl: 2   atorvastatin (LIPITOR) 40 MG tablet, TAKE 1 TABLET BY MOUTH DAILY AT 6 PM, Disp: 90 tablet, Rfl: 2    Cholecalciferol (VITAMIN D3) 2000 UNITS capsule, Take 2,000 Units by mouth daily. , Disp: , Rfl:    Lacosamide ([name]) 100 MG TABS, Take one tablet in AM, Disp: 30 tablet, Rfl: 11   Lacosamide ([name]) 150 MG TABS, Take one tablet in PM, Disp: 30 tablet, Rfl: 11   metoprolol tartrate (LOPRESSOR) 50 MG tablet, Take 1.5 tablets (75 mg total) by mouth 2 (two) times daily., Disp: 90 tablet, Rfl: 3   Multiple Vitamin (MULTIVITAMIN) capsule, Take 1 capsule by mouth daily., Disp: , Rfl:    Specialty Vitamins Products (MAGNESIUM, AMINO ACID CHELATE,) 133 MG tablet, Take 1 tablet by mouth 2 (two) times daily., Disp: , Rfl:    telmisartan (MICARDIS) 80 MG tablet, TAKE 1 TABLET BY MOUTH EVERY DAY, Disp: 90 tablet, Rfl: 1   B Complex Vitamins (B COMPLEX PO), Take 1 tablet by mouth daily.  (Patient not taking: No sig reported), Disp: , Rfl:    Allergies  Allergen Reactions   Sulfa Antibiotics Itching     Review of Systems  Constitutional: Negative.  Negative for chills, fatigue and fever.  Eyes:  Negative for blurred vision.  Respiratory:  Negative for shortness of breath.   Cardiovascular:  Negative for chest pain, palpitations and leg swelling.  Neurological:  Negative for dizziness and headaches.  Psychiatric/Behavioral: Negative.      Today's Vitals   04/22/21 1021  BP: (!) 160/90  Pulse: 86  Temp: 98.5 F (36.9 C)  Weight: 165 lb 12.8 oz (75.2 kg)  Height: 5\' 2"  (1.575 m)   Body mass index is 30.33 kg/m.   Objective:  Physical Exam Vitals reviewed.  Constitutional:      General: She is not in acute distress.    Appearance: Normal appearance. She is well-developed and normal weight.  Eyes:     Pupils: Pupils are equal, round, and reactive to light.  Cardiovascular:     Rate and Rhythm: Normal rate and regular rhythm.     Pulses: Normal pulses.     Heart sounds: Normal heart sounds. No murmur heard. Pulmonary:     Effort: Pulmonary effort is normal. No respiratory distress.      Breath sounds: Normal breath sounds. No wheezing.  Skin:    General: Skin is warm and dry.     Capillary Refill: Capillary refill takes less than 2 seconds.  Neurological:     General: No focal deficit present.     Mental Status: She is alert and oriented to person, place, and time.     Cranial Nerves: No cranial nerve deficit.     Motor: No weakness.  Psychiatric:        Mood and Affect: Mood normal.        Behavior: Behavior normal.        Thought Content: Thought content normal.        Judgment: Judgment normal.        Assessment And Plan:     1. Elevated  blood pressure reading with diagnosis of hypertension Comments: Continues to have an elevated blood pressure, will start on amlodipine 2.5 mg daily continue checking blood pressure, log B/P Keep f/u with Cardiology - amLODipine (NORVASC) 2.5 MG tablet; Take 1 tablet (2.5 mg total) by mouth daily.  Dispense: 30 tablet; Refill: 2  2. Hypertensive heart and renal disease with renal failure, stage 1 through stage 4 or unspecified chronic kidney disease, with heart failure (HCC) - amLODipine (NORVASC) 2.5 MG tablet; Take 1 tablet (2.5 mg total) by mouth daily.  Dispense: 30 tablet; Refill: 2  3. Seizures ([place])     Patient was given opportunity to ask questions. Patient verbalized understanding of the plan and was able to repeat key elements of the plan. All questions were answered to their satisfaction.  [name], FNP   I, [name], FNP, have reviewed all documentation for this visit. The documentation on 04/22/21 for the exam, diagnosis, procedures, and orders are all accurate and complete.   IF YOU HAVE BEEN REFERRED TO A SPECIALIST, IT MAY TAKE 1-2 WEEKS TO SCHEDULE/PROCESS THE REFERRAL. IF YOU HAVE NOT HEARD FROM US/SPECIALIST IN TWO WEEKS, PLEASE GIVE [place] A CALL AT [phone].   THE PATIENT IS ENCOURAGED TO PRACTICE SOCIAL DISTANCING DUE TO THE COVID-19 PANDEMIC.

## 2021-04-22 NOTE — Patient Instructions (Signed)

## 2021-04-22 NOTE — Telephone Encounter (Signed)
Spoke to pt and she saw pcp this am and added amlodipine 2.5mg  po at bedtime.  After speaking to her, she relayed that [place] visit to [place] she had 13 people sleeping in home, so she may not have been resting like she normally does.  She also taking her vimpat tablet 100mg  (takes 1.5 tab in pm).  These are not scored (so may not be accurate for evening dose).  I relayed received from [employer] [facility] that she if approved will get both strength tablets so will not be halving her tablets.   Takes 0900, 2100.  She will call them at [facility].  I made her an appt to discuss 04-29-21 at 1530 with MM/NP, she was appreciative.  Has appt with Cardiology 04-28-21. No other seizures since last. FYI

## 2021-04-27 ENCOUNTER — Ambulatory Visit (INDEPENDENT_AMBULATORY_CARE_PROVIDER_SITE_OTHER): Payer: HMO

## 2021-04-27 DIAGNOSIS — I700 Atherosclerosis of aorta: Secondary | ICD-10-CM

## 2021-04-27 DIAGNOSIS — I130 Hypertensive heart and chronic kidney disease with heart failure and stage 1 through stage 4 chronic kidney disease, or unspecified chronic kidney disease: Secondary | ICD-10-CM

## 2021-04-27 DIAGNOSIS — E1122 Type 2 diabetes mellitus with diabetic chronic kidney disease: Secondary | ICD-10-CM

## 2021-04-27 NOTE — Patient Instructions (Signed)
Visit Information It was great speaking with you today!  Please let me know if you have any questions about our visit.   Goals Addressed             This Visit's Progress    Track and Manage My Blood Pressure-Hypertension       Timeframe:  Long-Range Goal Priority:  High Start Date:                             Expected End Date:                       Follow Up Date 05/12/2021   - check blood pressure daily - choose a place to take my blood pressure (home, clinic or office, retail store) - write blood pressure results in a log or diary    Why is this important?   You won't feel high blood pressure, but it can still hurt your blood vessels.  High blood pressure can cause heart or kidney problems. It can also cause a stroke.  Making lifestyle changes like losing a little weight or eating less salt will help.  Checking your blood pressure at home and at different times of the day can help to control blood pressure.  If the doctor prescribes medicine remember to take it the way the doctor ordered.  Call the office if you cannot afford the medicine or if there are questions about it.     Notes:  -Continue to follow up with PCP team and cardiologist.          Patient Care Plan: [place] Plan     Problem Identified: HTN, HLD, ATHEROSCLEROSIS, SEIZURES   Priority: High     Long-Range Goal: Disease Management   This Visit's Progress: On track  Priority: High  Note:      Current Barriers:  Unable to independently monitor therapeutic efficacy  Pharmacist Clinical Goal(s):  Patient will achieve adherence to monitoring guidelines and medication adherence to achieve therapeutic efficacy through collaboration with PharmD and provider.   Interventions: 1:1 collaboration with [name], MD regarding development and update of comprehensive plan of care as evidenced by provider attestation and co-signature Inter-disciplinary care team collaboration (see  longitudinal plan of care) Comprehensive medication review performed; medication list updated in electronic medical record  Hypertension (BP goal <140/90) -Uncontrolled -Current treatment: Telmisartan 80 mg taking 1 tablet by mouth everyday Metoprolol tartrate 50 mg table taking 1.5 tablets by mouth two times daily -Current home readings: 177/85, 11 PM - 180/80  -Patient reported that she has been having these elevated readings since she was seen by the cardiologist.  -Current dietary habits: patient reports that she is a chicken lover, and she eats a lot of grilled food -Current exercise habits: she has been doing a lot of walking, she sometimes walks 10-20 minutes per day  -Denies hypotensive/hypertensive symptoms -Educated on Daily salt intake goal < 2300 mg; Importance of home blood pressure monitoring; Proper BP monitoring technique; -Going to the cardiologist 04/28/2021  -Counseled to monitor BP at home at least 6 times per week, document, and provide log at future appointments -Consulted with Dr. Baird Cancer who recommended the patient follow up with the cardiologist with her BP readings.  -Recommended to continue current medication -Collaborated with patient and recommend that she contact the cardiologist, patient agreed to contact the cardiologist.   Acute Coronary Syndrome/Hyperlipidemia: (LDL goal < 70) -  Controlled -Current treatment: Aspirin 81 mg tablet once per day Atorvastatin 40 mg tablet taking it daily -Current dietary patterns: will discuss further during next office visit -Current exercise habits: will discuss further during next office visit -Educated on Cholesterol goals;  Benefits of statin for ASCVD risk reduction; Importance of limiting foods high in cholesterol; Exercise goal of 150 minutes per week; -Recommended to continue current medication  Seizure  -Not ideally controlled -Current treatment  [name] 100 mg taking 1 tablet in the morning [name] 150 mg  taking 1 tablet at bedtime -Patient reports having a [place] she was [place]  -She is concerned  -Recommended to continue current medication   Health Maintenance -Vaccine gaps:     - Shingrix Vaccine    - Pneumonia Vaccine    - COVID-19 Booster  Patient Goals/Self-Care Activities Patient will:  - take medications as prescribed  Follow Up Plan: The patient has been provided with contact information for the care management team and has been advised to call with any health related questions or concerns.       Ms. Landry was given information about Chronic Care Management services today including:  CCM service includes personalized support from designated clinical staff supervised by her physician, including individualized plan of care and coordination with other care providers 24/7 contact phone numbers for assistance for urgent and routine care needs. Standard insurance, coinsurance, copays and deductibles apply for chronic care management only during months in which we provide at least 20 minutes of these services. Most insurances cover these services at 100%, however patients may be responsible for any copay, coinsurance and/or deductible if applicable. This service may help you avoid the need for more expensive face-to-face services. Only one practitioner may furnish and bill the service in a calendar month. The patient may stop CCM services at any time (effective at the end of the month) by phone call to the office staff.  Patient agreed to services and verbal consent obtained.   The patient verbalized understanding of instructions, educational materials, and care plan provided today and agreed to receive a mailed copy of patient instructions, educational materials, and care plan.   [name], PharmD Clinical Pharmacist [facility] [phone]

## 2021-04-27 NOTE — Chronic Care Management (AMB) (Signed)
Chronic Care Management    Social Work Note  04/27/2021 Name: Sara [name] MRN: [id number] DOB: Sep 17, 1956  Sara [name] is a 65 y.o. year old female who is a primary care patient of [name], MD. The CCM team was consulted to assist the patient with chronic disease management and/or care coordination needs related to:  Hypertensive Heart and Renal Disease, DM II with CKD III, and Atherosclerosis of Aorta .   Engaged with patient by telephone for initial visit in response to provider referral for social work chronic care management and care coordination services.   Consent to Services:  The patient was given the following information about Chronic Care Management services today, agreed to services, and gave verbal consent: 1. CCM service includes personalized support from designated clinical staff supervised by the primary care provider, including individualized plan of care and coordination with other care providers 2. 24/7 contact phone numbers for assistance for urgent and routine care needs. 3. Service will only be billed when office clinical staff spend 20 minutes or more in a month to coordinate care. 4. Only one practitioner may furnish and bill the service in a calendar month. 5.The patient may stop CCM services at any time (effective at the end of the month) by phone call to the office staff. 6. The patient will be responsible for cost sharing (co-pay) of up to 20% of the service fee (after annual deductible is met). Patient agreed to services and consent obtained.  Patient agreed to services and consent obtained.   Assessment: Review of patient past medical history, allergies, medications, and health status, including review of relevant consultants reports was performed today as part of a comprehensive evaluation and provision of chronic care management and care coordination services.     SW placed successful outbound call to the patient to assess for social work needs.  Patient is doing well with no acute resource needs at this time. Discussed the patient has been experiencing high blood pressure. Patient is being followed by primary physician as well as cardiology for HTN management. Performed chart review to note recent ED visit while on vacation due to a seizure. Patient is followed by neurology and has a scheduled visit Thursday 7.7.22 to assess increased seizure activity. Scheduled initial visit with RN Care Manager for August 10.  SDOH (Social Determinants of Health) assessments and interventions performed. No acute resource needs identified at this time. Advised patient she may contact SW as needed if future resource needs arise.  Advanced Directives Status: Not addressed in this encounter.  CCM Care Plan  Allergies  Allergen Reactions   Sulfa Antibiotics Itching    Outpatient Encounter Medications as of 04/27/2021  Medication Sig   amLODipine (NORVASC) 2.5 MG tablet Take 1 tablet (2.5 mg total) by mouth daily.   anagrelide ([name]) 1 MG capsule TAKE 1 CAPSULE BY MOUTH DAILY   aspirin EC 81 MG tablet Take 1 tablet (81 mg total) by mouth daily. Swallow whole.   atorvastatin (LIPITOR) 40 MG tablet TAKE 1 TABLET BY MOUTH DAILY AT 6 PM   B Complex Vitamins (B COMPLEX PO) Take 1 tablet by mouth daily.  (Patient not taking: No sig reported)   Cholecalciferol (VITAMIN D3) 2000 UNITS capsule Take 2,000 Units by mouth daily.    Lacosamide ([name]) 100 MG TABS Take one tablet in AM   Lacosamide ([name]) 150 MG TABS Take one tablet in PM   metoprolol tartrate (LOPRESSOR) 50 MG tablet Take 1.5 tablets (75 mg total)  by mouth 2 (two) times daily.   Multiple Vitamin (MULTIVITAMIN) capsule Take 1 capsule by mouth daily.   Specialty Vitamins Products (MAGNESIUM, AMINO ACID CHELATE,) 133 MG tablet Take 1 tablet by mouth 2 (two) times daily.   telmisartan (MICARDIS) 80 MG tablet TAKE 1 TABLET BY MOUTH EVERY DAY   No facility-administered encounter medications on file  as of 04/27/2021.    Patient Active Problem List   Diagnosis Date Noted   Hypertensive heart and renal disease 04/22/2021   Acute renal failure superimposed on stage 3a chronic kidney disease ([place]) 01/22/2021   Hyponatremia 01/22/2021   Ischemic cardiomyopathy 01/22/2021   Hypokalemia 01/22/2021   Nausea vomiting and diarrhea    SVT (supraventricular tachycardia) ([place]) 11/04/2020   Nephropathy 07/28/2018   Chronic kidney disease, stage 2 (mild) 07/28/2018   History of renal artery stenosis 05/16/2018   CAD s/p CABG  05/16/2018   S/P CABG x 2 04/21/2017   Elevated troponin    NSTEMI (non-ST elevated myocardial infarction) ([place])    Chest pain 04/15/2017   ACS (acute coronary syndrome) ([place]) 04/15/2017   Seizures ([place]) 01/19/2016   Typical absence seizures 03/28/2014   Sleep apnea with use of continuous positive airway pressure (CPAP) 01/30/2014   Hypercholesterolemia 01/04/2014   OSA (obstructive sleep apnea) 10/28/2013   Syncope 07/21/2013   Type 2 diabetes mellitus ([place]) 07/20/2013   Acute renal failure ([place]) 07/20/2013   History of renal stent 07/20/2013   Hypotension due to hypovolemia 07/20/2013   Chronic diastolic CHF (congestive heart failure) ([place]) 07/20/2013   Essential thrombocythemia ([place]) 04/19/2012   Hypertension 04/19/2012   Renal artery stenosis (right) s/p stent 2012 12/13/2010    Conditions to be addressed/monitored:  Hypertensive Heart and Renal Disease, DM II with CKD III, and Atherosclerosis of Aorta  There are no care plans that you recently modified to display for this patient.    Follow Up Plan:  Collaboration with RN Care Manager to advise of patient enrollment status and scheduled appointment on August 10. SW is available to assist with future resource needs. No current SW follow up planned at this time.      Daneen Schick, BSW, CDP Social Worker, Certified Dementia Practitioner [place] [facility] [place] [facility] [phone]

## 2021-04-27 NOTE — Patient Instructions (Addendum)
Social Worker Visit Information  Goals we discussed today:   Goals Addressed   None      Materials provided: Verbal education about RN Care Freight forwarder role provided by phone  Ms. Straker was given information about Chronic Care Management services today including:  CCM service includes personalized support from designated clinical staff supervised by her physician, including individualized plan of care and coordination with other care providers 24/7 contact phone numbers for assistance for urgent and routine care needs. Service will only be billed when office clinical staff spend 20 minutes or more in a month to coordinate care. Only one practitioner may furnish and bill the service in a calendar month. The patient may stop CCM services at any time (effective at the end of the month) by phone call to the office staff. The patient will be responsible for cost sharing (co-pay) of up to 20% of the service fee (after annual deductible is met).  Patient agreed to services and verbal consent obtained.   Patient verbalizes understanding of instructions provided today and agrees to view in [place].   Follow up plan:  No planned SW follow up at this time. Please contact me as needed. You are scheduled for a telephone appointment with [place] on August 10 at 12:30 pm.  Daneen Schick, BSW, CDP Social Worker, Certified Dementia Practitioner [place] [facility] [place] [facility] [phone]

## 2021-04-28 ENCOUNTER — Other Ambulatory Visit: Payer: Self-pay

## 2021-04-28 ENCOUNTER — Ambulatory Visit: Payer: BC Managed Care – PPO | Admitting: *Deleted

## 2021-04-28 ENCOUNTER — Other Ambulatory Visit: Payer: Self-pay | Admitting: Hematology

## 2021-04-28 VITALS — BP 134/80 | HR 74 | Ht 62.0 in | Wt 164.2 lb

## 2021-04-28 DIAGNOSIS — I10 Essential (primary) hypertension: Secondary | ICD-10-CM

## 2021-04-28 DIAGNOSIS — D473 Essential (hemorrhagic) thrombocythemia: Secondary | ICD-10-CM

## 2021-04-28 NOTE — Progress Notes (Signed)
Reason for visit:  blood pressure check   Name of MD requesting visit: Almyra Deforest PA  H&P: n/a  ROS related to problem:  vital sign obtained by T. Ouida Sills ,CMA and reviewed by Almyra Deforest PA  Assessment and plan per MD:  Almyra Deforest PA  - reviewed vital signs and talked to patient . H. Meng PA informed patient she has clearance for surgery .  Information will be sent her surgeons's office

## 2021-04-29 ENCOUNTER — Ambulatory Visit (INDEPENDENT_AMBULATORY_CARE_PROVIDER_SITE_OTHER): Payer: HMO | Admitting: Adult Health

## 2021-04-29 ENCOUNTER — Encounter: Payer: Self-pay | Admitting: Internal Medicine

## 2021-04-29 VITALS — BP 188/83 | HR 66 | Ht 62.0 in | Wt 163.4 lb

## 2021-04-29 DIAGNOSIS — R569 Unspecified convulsions: Secondary | ICD-10-CM | POA: Diagnosis not present

## 2021-04-29 MED ORDER — LACOSAMIDE 150 MG PO TABS: 150.0000 mg | ORAL_TABLET | Freq: Two times a day (BID) | ORAL | 11 refills | Status: DC

## 2021-04-29 NOTE — Progress Notes (Signed)
PATIENT: Sara [name] DOB: 03/01/1956  REASON FOR VISIT: follow up HISTORY FROM: patient Primary neurologist: Dr. Jannifer Franklin  HISTORY OF PRESENT ILLNESS: Today 04/29/21:  Sara [name] is a 65 year old female with a history of seizures and obstructive sleep apnea.  She returns today for follow-up.  The patient reports that [place] and [place].  She states that there was one night that her sister tried to wake her and was not able to wake her so they called EMS.  The sisters thought she had had a seizure event.  The patient states that she had not slept well and was just in a deep sleep.  She reports that she was not disoriented with EMS and was released later that night.  She remains on [name] 100 mg in the morning and 150 mg in the evening.  She is also following up with cardiology.  11/26/20:Sara [name] is a 65 year old female with a history of seizures and obstructive sleep apnea.  She returns today for follow-up.  She states that she had another "attack" in December or January.  The attacks always consist of her making sounds during her sleep.  She called her primary care provider.  Dr. Baird Cancer suggested that she take her sugar or have her husband take her sugar when she is making the sounds.  The patient states that typically when she wakes up she feels fine.  She continues to take [name] 100 mg in the morning and 150mg  at bedtime.  She denies missing any medication.  She returns today for an evaluation.  07/22/20:Sara [name] is a 65 year old female with a history of seizures and obstructive sleep apnea.  She returns today for follow-up.  Since her [name] was increased she reports that her husband has not reported any nocturnal events.  Overall she feels that she has been doing well.  Her CPAP download indicates that she use her machine nightly for compliance of 100%.  She used her machine greater than 4 hours each night.  On average she uses her machine 6  hours and 36 minutes.  Her residual AHI is 1.6 on 9 cm of water with EPR of 2.  Leak in the 95th percentile is 42 L/min.  She states that she does not feel the machine leaking at night.  Overall she feels that she is doing well with the CPAP  HISTORY 03/02/20  Sara [name] is a 65 year old female with history of seizures and obstructive sleep apnea on CPAP.  Her seizures are typically characterized as staring off.  There was question of possible nocturnal seizure last year when she was taking Ozempic, question possible nocturnal seizure described as chewing around 2 AM.  [name] level  was normal, she remained on [name] 100 mg twice daily.  She is overall doing well, has reported 1 or 2 episodes her husband describes as, her making a loud noise, he couldn't wake her, when wearing CPAP, he went back to sleep. She has been trying to not eat before bed.  Recent A1c was 6.3.  She reports increase in stress.  She says when her husband notices these episodes, it it when she she is really tired, and not well rested.  She really does not want to go up on [name], she requires brand-name.  She presents today for evaluation unaccompanied.   Checked CPAP for equipment function, used for more than 4 hours, 29 out of 30 days, 97%, average usage (days used) 6  hours 44 minutes, set pressure 9 cmH2O, leak in the 95th percentile was 48.5, AHI 2.8.    REVIEW OF SYSTEMS: Out of a complete 14 system review of symptoms, the patient complains only of the following symptoms, and all other reviewed systems are negative.   ALLERGIES: Allergies  Allergen Reactions   Sulfa Antibiotics Itching    HOME MEDICATIONS: Outpatient Medications Prior to Visit  Medication Sig Dispense Refill   amLODipine (NORVASC) 2.5 MG tablet Take 1 tablet (2.5 mg total) by mouth daily. 30 tablet 2   anagrelide ([name]) 1 MG capsule TAKE 1 CAPSULE BY MOUTH DAILY 30 capsule 5   aspirin EC 81 MG tablet Take 1 tablet (81 mg total) by mouth daily.  Swallow whole. 150 tablet 2   atorvastatin (LIPITOR) 40 MG tablet TAKE 1 TABLET BY MOUTH DAILY AT 6 PM 90 tablet 2   Cholecalciferol (VITAMIN D3) 2000 UNITS capsule Take 2,000 Units by mouth daily.      Lacosamide ([name]) 100 MG TABS Take one tablet in AM 30 tablet 11   Lacosamide ([name]) 150 MG TABS Take one tablet in PM 30 tablet 11   metoprolol tartrate (LOPRESSOR) 50 MG tablet Take 1.5 tablets (75 mg total) by mouth 2 (two) times daily. 90 tablet 3   Multiple Vitamin (MULTIVITAMIN) capsule Take 1 capsule by mouth daily.     Specialty Vitamins Products (MAGNESIUM, AMINO ACID CHELATE,) 133 MG tablet Take 1 tablet by mouth 2 (two) times daily.     telmisartan (MICARDIS) 80 MG tablet TAKE 1 TABLET BY MOUTH EVERY DAY 90 tablet 1   No facility-administered medications prior to visit.    PAST MEDICAL HISTORY: Past Medical History:  Diagnosis Date   Diabetes mellitus    Gout    Hyperlipemia    Hypertension    LVH (left ventricular hypertrophy)    12/21/09 Echo -mild asymmetric LVH,EF =>55%   Morbid obesity (HCC)    OSA (obstructive sleep apnea)    PAD (peripheral artery disease) ([place])    Renal artery stenosis ([place]) 12/13/2010   PTCA and Stent - right   Seizures (HCC)    Syncopal episodes    Thrombocytosis     PAST SURGICAL HISTORY: Past Surgical History:  Procedure Laterality Date   BREAST CYST EXCISION     S/P Benign Right Breast Cyst Removal.   BREAST REDUCTION SURGERY  1995   S/P Bilateral breast reduction   [place], 1986   Times  Two.   CORONARY ARTERY BYPASS GRAFT N/A 04/21/2017   Procedure: CORONARY ARTERY BYPASS GRAFTING (CABG) x 2, USING LEFT MAMMARY ARTERY AND RIGHT GREATER SAPHENOUS VEIN HARVESTED ENDOSCOPICALLY;  Surgeon: [name], MD;  Location: [facility];  Service: Open Heart Surgery;  Laterality: N/A;   EXTREMITY CYST EXCISION  1993   left wrist   LEFT HEART CATH AND CORONARY ANGIOGRAPHY N/A 04/17/2017   Procedure: Left Heart Cath and Coronary  Angiography;  Surgeon: Jettie Booze, MD;  Location: [facility];  Service: Cardiovascular;  Laterality: N/A;   PARTIAL HYSTERECTOMY  1994   renal artery stent placement Right    TEE WITHOUT CARDIOVERSION N/A 04/21/2017   Procedure: TRANSESOPHAGEAL ECHOCARDIOGRAM (TEE);  Surgeon: [name], MD;  Location: [facility];  Service: Open Heart Surgery;  Laterality: N/A;    FAMILY HISTORY: Family History  Problem Relation Age of Onset   Heart failure Mother    Diabetes Father    Diabetes Sister    Hypertension  Sister    Hypertension Sister    Hypertension Sister    Breast cancer Maternal Aunt        2 maternal aunts had breast cancer.   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    Employer: [employer]  Tobacco Use   Smoking status: Never   Smokeless tobacco: Never  Vaping Use   Vaping Use: Never used  Substance and Sexual Activity   Alcohol use: No    Alcohol/week: 0.0 standard drinks   Drug use: No   Sexual activity: Yes  Other Topics Concern   Not on file  Social History Narrative   Patient lives at husband and her son, home with family.   Patient has two adult children.   Patient is not drinking any caffeine.   Patient is working full-time.   Patient has a college education.   Patient is right-handed.   Social Determinants of Health   Financial Resource Strain: Low Risk    Difficulty of Paying Living Expenses: Not hard at all  Food Insecurity: No Food Insecurity   Worried About Charity fundraiser in the Last Year: Never true   [place] in the Last Year: Never true  Transportation Needs: No Transportation Needs   Lack of Transportation (Medical): No   Lack of Transportation  (Non-Medical): No  Physical Activity: Insufficiently Active   Days of Exercise per Week: 2 days   Minutes of Exercise per Session: 30 min  Stress: No Stress Concern Present   Feeling of Stress : Not at all  Social Connections: Socially Integrated   Frequency of Communication with Friends and Family: More than three times a week   Frequency of Social Gatherings with Friends and Family: Not on file   Attends Religious Services: More than 4 times per year   Active Member of [employer] or Organizations: Yes   Attends Music therapist: More than 4 times per year   Marital Status: Married  Human resources officer Violence: Not At Risk   Fear of Current or Ex-Partner: No   Emotionally Abused: No   Physically Abused: No   Sexually Abused: No      PHYSICAL EXAM  Vitals:   04/29/21 1526  BP: (!) 188/83  Pulse: 66  Weight: 163 lb 6.4 oz (74.1 kg)  Height: 5\' 2"  (1.575 m)   Body mass index is 29.89 kg/m.  Generalized: Well developed, in no acute distress   Neurological examination  Mentation: Alert oriented to time, place, history taking. Follows all commands speech and language fluent Cranial nerve II-XII: Pupils were equal round reactive to light. Extraocular movements were full, visual field were full on confrontational test.. Head turning and shoulder shrug  were normal and symmetric. Motor: The motor testing reveals 5 over 5 strength of all 4 extremities. Good symmetric motor tone is noted throughout.  Sensory: Sensory testing is intact to soft touch on all 4 extremities. No evidence of extinction is noted.  Coordination: Cerebellar testing reveals good finger-nose-finger and heel-to-shin bilaterally.  Gait and station: Gait is normal.   Reflexes: Deep tendon reflexes are symmetric and normal bilaterally.   DIAGNOSTIC DATA (LABS, IMAGING, TESTING) - I reviewed patient records, labs,  notes, testing and imaging myself where available.  Lab Results  Component Value Date   WBC  6.2 03/03/2021   HGB 12.4 03/03/2021   HCT 37.4 03/03/2021   MCV 86.8 03/03/2021   PLT 439 (H) 03/03/2021      Component Value Date/Time   NA 139 03/03/2021 0852   NA 138 02/04/2021 1449   NA 139 08/11/2017 1528   K 3.9 03/03/2021 0852   K 4.2 08/11/2017 1528   CL 103 03/03/2021 0852   CL 102 03/19/2013 1535   CO2 27 03/03/2021 0852   CO2 27 08/11/2017 1528   GLUCOSE 82 03/03/2021 0852   GLUCOSE 93 08/11/2017 1528   GLUCOSE 117 (H) 03/19/2013 1535   BUN 22 03/03/2021 0852   BUN 12 02/04/2021 1449   BUN 15.0 08/11/2017 1528   CREATININE 1.37 (H) 03/03/2021 0852   CREATININE 1.1 08/11/2017 1528   CALCIUM 9.6 03/03/2021 0852   CALCIUM 9.2 08/11/2017 1528   PROT 6.6 03/03/2021 0852   PROT 7.0 01/14/2021 1521   PROT 7.0 08/11/2017 1528   ALBUMIN 3.4 (L) 03/03/2021 0852   ALBUMIN 4.3 01/14/2021 1521   ALBUMIN 3.6 08/11/2017 1528   AST 17 03/03/2021 0852   AST 27 08/11/2017 1528   ALT 13 03/03/2021 0852   ALT 26 08/11/2017 1528   ALKPHOS 75 03/03/2021 0852   ALKPHOS 88 08/11/2017 1528   BILITOT 0.5 03/03/2021 0852   BILITOT 0.4 01/14/2021 1521   BILITOT 0.37 08/11/2017 1528   GFRNONAA 43 (L) 03/03/2021 0852   GFRAA 49 (L) 11/16/2020 1209   Lab Results  Component Value Date   CHOL 111 11/04/2020   HDL 47 11/04/2020   LDLCALC 54 11/04/2020   TRIG 39 11/04/2020   CHOLHDL 2.4 11/04/2020   Lab Results  Component Value Date   HGBA1C 5.9 (H) 03/18/2021   Lab Results  Component Value Date   VITAMINB12 924 03/13/2019   Lab Results  Component Value Date   TSH 0.845 01/22/2021      ASSESSMENT AND PLAN 65 y.o. year old female  has a past medical history of Diabetes mellitus, Gout, Hyperlipemia, Hypertension, LVH (left ventricular hypertrophy), Morbid obesity ([place]), OSA (obstructive sleep apnea), PAD (peripheral artery disease) ([place]), Renal artery stenosis ([place]) (12/13/2010), Seizures ([place]), Syncopal episodes, and Thrombocytosis. here with:   1.  Seizures  Unclear  if this was a true seizure event however she has had similar events in the past. We will increase [name] to 150 mg twice a day to see if this eliminates these events. Reviewed side effects of [name] with the patient Advised if she has any seizure event she should let us know Follow-up in 6 months or sooner if needed   I spent 30 minutes of face-to-face and non-face-to-face time with patient.  This included previsit chart review, lab review, study review, order entry, electronic health record documentation, patient education.  Ward Givens, MSN, NP-C 04/29/2021, 3:41 PM [facility] [address] [place], [place] [address] [phone]

## 2021-04-29 NOTE — Patient Instructions (Signed)
Your Plan:  Increase [name] 150 mg twice a day If your symptoms worsen or you develop new symptoms please let us know.   Thank you for coming to see [place] at [facility]. I hope we have been able to provide you high quality care today.  You may receive a patient satisfaction survey over the next few weeks. We would appreciate your feedback and comments so that we may continue to improve ourselves and the health of our patients.

## 2021-04-29 NOTE — Progress Notes (Signed)
I have read the note, and I agree with the clinical assessment and plan.  Reverie Vaquera K Zariah Jost   

## 2021-04-29 NOTE — Telephone Encounter (Signed)
    Sara [name] DOB:  Jan 11, 1956  MRN:  [id number]   Primary Cardiologist: Sanda Klein, MD  Chart reviewed as part of pre-operative protocol coverage. Given past medical history and time since last visit, based on ACC/AHA guidelines, Sara [name] would be at acceptable risk for the planned procedure without further cardiovascular testing.   Patient was seen in the cardiology office, BP was initially high, after medication adjustment, blood pressure is well controlled. She may proceed with surgery.   The patient was advised that if she develops new symptoms prior to surgery to contact our office to arrange for a follow-up visit, and she verbalized understanding.  I will route this recommendation to the requesting party via Epic fax function and remove from pre-op pool.  Please call with questions.  [place], [place] 04/29/2021, 4:30 PM

## 2021-04-30 ENCOUNTER — Encounter: Payer: Self-pay | Admitting: Hematology

## 2021-05-07 LAB — HM MAMMOGRAPHY

## 2021-05-07 NOTE — Progress Notes (Signed)
Patient wanted to inform office of changes in medication.  Stated her [name] was increased from 100 mg in the morning to 150 mg in the morning and evening dose will stay the same of 150 mg.  No further questions or concerns noted.

## 2021-05-10 ENCOUNTER — Telehealth: Payer: Self-pay | Admitting: Adult Health

## 2021-05-10 NOTE — Telephone Encounter (Signed)
Heather from [facility] called requesting refill for Lacosamide ([name]) 150 MG TABS. Can be reached at [phone].

## 2021-05-11 ENCOUNTER — Telehealth: Payer: Self-pay

## 2021-05-11 MED ORDER — LACOSAMIDE 150 MG PO TABS
150.0000 mg | ORAL_TABLET | Freq: Two times a day (BID) | ORAL | 11 refills | Status: DC
Start: 2021-05-11 — End: 2021-05-12

## 2021-05-11 NOTE — Chronic Care Management (AMB) (Signed)
Date- Patient called to remind of appointment with (CPP) on (date and time of appointment)   No answer, left message of appointment date, time and type of appointment (either telephone or in person). Left message to have all medications, supplements, blood pressure and/or blood sugar logs available during appointment and to return call if need to reschedule.   Patient aware of telephone appointment with [name] CPP on 05-12-2021 at 12:30. Patient aware to have/bring all medications, supplements, blood pressure and/or blood sugar logs to visit.  Questions: Have you had any recent office visit or specialist visit outside of [facility]? Patient stated no  Are there any concerns you would like to discuss during your office visit? Patient stated she was out in public but will discuss tomorrow.  Are you having any problems obtaining your medications? (Whether it pharmacy issues or cost) Patient stated no  If patient has any PAP medications ask if they are having any problems getting their PAP medication or refill?  Care Gaps: Covid booster overdue Annual wellness 11-04-2021   Star Rating Drug: Telmisartan 80 mg- Last filled 03/28/2021 for 90 day supply at [facility] Atorvastatin 40 mg- Last filled 03/28/2021 for 90 day supply at [facility]   Any gaps in medications fill history?  [place] Pharmacist Assistant [phone]

## 2021-05-11 NOTE — Addendum Note (Signed)
Addended by: Brandon Melnick on: 05/11/2021 02:28 PM   Modules accepted: Orders

## 2021-05-12 ENCOUNTER — Ambulatory Visit: Payer: HMO

## 2021-05-12 ENCOUNTER — Encounter: Payer: Self-pay | Admitting: Internal Medicine

## 2021-05-12 ENCOUNTER — Other Ambulatory Visit: Payer: Self-pay

## 2021-05-12 ENCOUNTER — Ambulatory Visit (INDEPENDENT_AMBULATORY_CARE_PROVIDER_SITE_OTHER): Payer: HMO | Admitting: Cardiovascular Disease

## 2021-05-12 ENCOUNTER — Encounter: Payer: Self-pay | Admitting: Cardiovascular Disease

## 2021-05-12 VITALS — BP 142/82 | HR 70 | Ht 62.0 in | Wt 165.0 lb

## 2021-05-12 DIAGNOSIS — I5032 Chronic diastolic (congestive) heart failure: Secondary | ICD-10-CM

## 2021-05-12 DIAGNOSIS — I2510 Atherosclerotic heart disease of native coronary artery without angina pectoris: Secondary | ICD-10-CM

## 2021-05-12 DIAGNOSIS — I1310 Hypertensive heart and chronic kidney disease without heart failure, with stage 1 through stage 4 chronic kidney disease, or unspecified chronic kidney disease: Secondary | ICD-10-CM

## 2021-05-12 DIAGNOSIS — I10 Essential (primary) hypertension: Secondary | ICD-10-CM

## 2021-05-12 DIAGNOSIS — E1122 Type 2 diabetes mellitus with diabetic chronic kidney disease: Secondary | ICD-10-CM

## 2021-05-12 DIAGNOSIS — G4733 Obstructive sleep apnea (adult) (pediatric): Secondary | ICD-10-CM

## 2021-05-12 DIAGNOSIS — N1832 Chronic kidney disease, stage 3b: Secondary | ICD-10-CM

## 2021-05-12 DIAGNOSIS — I130 Hypertensive heart and chronic kidney disease with heart failure and stage 1 through stage 4 chronic kidney disease, or unspecified chronic kidney disease: Secondary | ICD-10-CM | POA: Diagnosis not present

## 2021-05-12 DIAGNOSIS — N1831 Chronic kidney disease, stage 3a: Secondary | ICD-10-CM

## 2021-05-12 DIAGNOSIS — D473 Essential (hemorrhagic) thrombocythemia: Secondary | ICD-10-CM

## 2021-05-12 DIAGNOSIS — E7800 Pure hypercholesterolemia, unspecified: Secondary | ICD-10-CM

## 2021-05-12 DIAGNOSIS — N183 Chronic kidney disease, stage 3 unspecified: Secondary | ICD-10-CM

## 2021-05-12 DIAGNOSIS — I700 Atherosclerosis of aorta: Secondary | ICD-10-CM

## 2021-05-12 DIAGNOSIS — Z8679 Personal history of other diseases of the circulatory system: Secondary | ICD-10-CM

## 2021-05-12 DIAGNOSIS — E669 Obesity, unspecified: Secondary | ICD-10-CM

## 2021-05-12 MED ORDER — LACOSAMIDE 150 MG PO TABS: 150.0000 mg | ORAL_TABLET | Freq: Two times a day (BID) | ORAL | 11 refills | Status: DC

## 2021-05-12 NOTE — Addendum Note (Signed)
Addended by: Brandon Melnick on: 05/12/2021 12:32 PM   Modules accepted: Orders

## 2021-05-12 NOTE — Progress Notes (Signed)
Cardiology Office Note   Date:  05/13/2021   ID:  Sara [name], DOB Sep 05, 1956, MRN [id number] PCP:  [name], MD  Cardiologist:  Quinto Tippy Electrophysiologist:  None   Evaluation Performed:  Follow-Up Visit  Chief Complaint:  CAD  History of Present Illness:    Sara [name] is a 65 y.o. female with history of artery disease (NSTEMI April 15, 2017, LIMA to LAD and SVG to diagonal bypass surgery on April 21, 2017).  Mild ischemic cardiomyopathy (EF 45-50%), peripheral arterial disease (renal artery stenosis with stent 2012), morbid obesity, diabetes mellitus, hyperlipidemia, hypertension, obstructive sleep apnea on CPAP, essential thrombocythemia and absence seizures, returning for routine follow-up.  She is done quite well from a cardiovascular point of view.  She had another "spell" when she was on a family vacation in the [place].  Her family found her in bed unresponsive and unarousable, performing a continuous chewing type motion with her jaw.  She woke up while being transported to the hospital in the ambulance and apparently had completely normal vital signs throughout the entire sequence of events.  Her dose of antiepileptic medication has been adjusted.  The patient specifically denies any chest pain at rest exertion, dyspnea at rest or with exertion, orthopnea, paroxysmal nocturnal dyspnea, syncope, palpitations, focal neurological deficits, intermittent claudication, lower extremity edema, unexplained weight gain, cough, hemoptysis or wheezing.  She is doing a good job losing weight and is now right at the edge of overweight/obese.  Intent on losing more weight.  Blood pressure is very slightly high today.  She has noticed that the blood pressure is only elevated when she first wakes up in the morning and then has normal values later throughout the day.  Plan to switch her amlodipine to evening time.  There is room to increase her dose of amlodipine if necessary.  She  is on a maximum dose of telmisartan, important for renal function preservation, also on beta-blockers are to help with previous problems with palpitations.  She was hospitalized 04/01 - 01/28/2021 with a lethargy, nausea vomiting and diarrhea and transient acute kidney injury.  She had a small bowel obstruction that resolved with conservative management.   Recent LDL cholesterol was excellent at 54 (on atorvastatin) and her hemoglobin A1c was improved at 5.9%, without taking any antidiabetic medications.  She is very busy taking care of her sister who had this a disabling stroke, her son who has paranoid schizophrenia and her husband who has lumbar spine disease and limited mobility.  She is planning to retire from [employer] on October 1.  Past Medical History:  Diagnosis Date   Diabetes mellitus    Gout    Hyperlipemia    Hypertension    LVH (left ventricular hypertrophy)    12/21/09 Echo -mild asymmetric LVH,EF =>55%   Morbid obesity (HCC)    OSA (obstructive sleep apnea)    PAD (peripheral artery disease) ([place])    Renal artery stenosis ([place]) 12/13/2010   PTCA and Stent - right   Seizures (HCC)    Syncopal episodes    Thrombocytosis    Past Surgical History:  Procedure Laterality Date   BREAST CYST EXCISION     S/P Benign Right Breast Cyst Removal.   BREAST REDUCTION SURGERY  1995   S/P Bilateral breast reduction   [place], 1986   Times  Two.   CORONARY ARTERY BYPASS GRAFT N/A 04/21/2017   Procedure: CORONARY ARTERY BYPASS GRAFTING (CABG) x 2,  USING LEFT MAMMARY ARTERY AND RIGHT GREATER SAPHENOUS VEIN HARVESTED ENDOSCOPICALLY;  Surgeon: [name], MD;  Location: [facility];  Service: Open Heart Surgery;  Laterality: N/A;   EXTREMITY CYST EXCISION  1993   left wrist   LEFT HEART CATH AND CORONARY ANGIOGRAPHY N/A 04/17/2017   Procedure: Left Heart Cath and Coronary Angiography;  Surgeon: Jettie Booze, MD;  Location: [facility];   Service: Cardiovascular;  Laterality: N/A;   PARTIAL HYSTERECTOMY  1994   renal artery stent placement Right    TEE WITHOUT CARDIOVERSION N/A 04/21/2017   Procedure: TRANSESOPHAGEAL ECHOCARDIOGRAM (TEE);  Surgeon: [name], MD;  Location: [facility];  Service: Open Heart Surgery;  Laterality: N/A;     Current Meds  Medication Sig   amLODipine (NORVASC) 2.5 MG tablet Take 1 tablet (2.5 mg total) by mouth daily.   anagrelide ([name]) 1 MG capsule TAKE 1 CAPSULE(1 MG) BY MOUTH DAILY   aspirin EC 81 MG tablet Take 1 tablet (81 mg total) by mouth daily. Swallow whole.   atorvastatin (LIPITOR) 40 MG tablet TAKE 1 TABLET BY MOUTH DAILY AT 6 PM   Cholecalciferol (VITAMIN D3) 2000 UNITS capsule Take 2,000 Units by mouth daily.    Lacosamide ([name]) 150 MG TABS Take 1 tablet (150 mg total) by mouth 2 (two) times daily.   metoprolol tartrate (LOPRESSOR) 50 MG tablet Take 1.5 tablets (75 mg total) by mouth 2 (two) times daily.   Multiple Vitamin (MULTIVITAMIN) capsule Take 1 capsule by mouth daily.   Specialty Vitamins Products (MAGNESIUM, AMINO ACID CHELATE,) 133 MG tablet Take 1 tablet by mouth 2 (two) times daily.   telmisartan (MICARDIS) 80 MG tablet TAKE 1 TABLET BY MOUTH EVERY DAY     Allergies:   Sulfa antibiotics   Social History   Tobacco Use   Smoking status: Never   Smokeless tobacco: Never  Vaping Use   Vaping Use: Never used  Substance Use Topics   Alcohol use: No    Alcohol/week: 0.0 standard drinks   Drug use: No     Family Hx: The patient's family history includes Breast cancer in her maternal aunt and maternal grandmother; Diabetes in her father and sister; Heart failure in her mother; Hypertension in her sister, sister, and sister.  ROS:   Please see the history of present illness.     All other systems reviewed and are negative.   Prior CV studies:   The following studies were reviewed today:  Labs/Other Tests and Data Reviewed:    EKG: ECG ordered today  shows normal sinus rhythm, QS pattern in leads V1-V2, no ischemic repolarization abnormalities, QTC 421 ms  Recent Labs: 01/22/2021: B Natriuretic Peptide 376.8; TSH 0.845 01/27/2021: Magnesium 2.2 03/03/2021: ALT 13; BUN 22; Creatinine, Ser 1.37; Hemoglobin 12.4; Platelets 439; Potassium 3.9; Sodium 139   Recent Lipid Panel Lab Results  Component Value Date/Time   CHOL 111 11/04/2020 04:28 PM   TRIG 39 11/04/2020 04:28 PM   HDL 47 11/04/2020 04:28 PM   CHOLHDL 2.4 11/04/2020 04:28 PM   CHOLHDL 2.7 04/17/2017 10:00 AM   LDLCALC 54 11/04/2020 04:28 PM    Wt Readings from Last 3 Encounters:  05/12/21 165 lb (74.8 kg)  04/29/21 163 lb 6.4 oz (74.1 kg)  04/28/21 164 lb 3.2 oz (74.5 kg)     Objective:    Vital Signs:  BP (!) 142/82 (BP Location: Left Arm, Patient Position: Sitting, Cuff Size: Large)   Pulse 70  Ht 5\' 2"  (1.575 m)   Wt 165 lb (74.8 kg)   SpO2 97%   BMI 30.18 kg/m     General: Alert, oriented x3, no distress, overweight/borderline obese Head: no evidence of trauma, PERRL, EOMI, no exophtalmos or lid lag, no myxedema, no xanthelasma; normal ears, nose and oropharynx Neck: normal jugular venous pulsations and no hepatojugular reflux; brisk carotid pulses without delay and no carotid bruits Chest: clear to auscultation, no signs of consolidation by percussion or palpation, normal fremitus, symmetrical and full respiratory excursions Cardiovascular: normal position and quality of the apical impulse, regular rhythm, normal first and second heart sounds, no murmurs, rubs or gallops Abdomen: no tenderness or distention, no masses by palpation, no abnormal pulsatility or arterial bruits, normal bowel sounds, no hepatosplenomegaly Extremities: no clubbing, cyanosis or edema; 2+ radial, ulnar and brachial pulses bilaterally; 2+ right femoral, posterior tibial and dorsalis pedis pulses; 2+ left femoral, posterior tibial and dorsalis pedis pulses; no subclavian or femoral  bruits Neurological: grossly nonfocal Psych: Normal mood and affect   ASSESSMENT & PLAN:    1. Coronary artery disease involving native coronary artery of native heart without angina pectoris   2. Chronic diastolic congestive heart failure ([place])   3. Essential hypertension   4. OSA (obstructive sleep apnea)   5. Controlled type 2 diabetes mellitus with stage 3 chronic kidney disease, without long-term current use of insulin ([place])   6. Hypercholesterolemia   7. Mild obesity   8. History of renal artery stenosis   9. Stage 3b chronic kidney disease ([place])   10. Essential thrombocythemia ([place])       CAD s/p CABG: Physically active, without angina pectoris. CHF: NYHA functional class I.  Appears clinically euvolemic without the use of diuretics. HTN: Very slightly elevated systolic blood pressure today.  On 04/28/2021 her blood pressure was 134/80.  Reminded her of the importance of sodium restriction.  Switch the time of amlodipine administration to evening, to help cover the morning elevated blood pressure.  No changes made to her current medication dosages but asked her to keep an eye on her blood pressure at home. OSA: Orts compliance with CPAP and denies daytime hypersomnolence. DM: Improving hemoglobin A1c, not on any medications.  Now firmly out of diabetes range, although still not completely normal. HLP: Excellent lipid parameters.  Continue statin. Obesity: Job with weight loss.  I am hopeful we will not need to adjust her BP medications as she continues to lose weight. Hx of renal artery stent: This might also need to be reviewed if her blood pressure becomes more difficult to control.  On maximum dose telmisartan.  Creatinine relatively unchanged at 1.37 (down from 1.67 in April) CKD 3b: Baseline creatinine seems to be around 1.35 (GFR 45).  Essential thrombocythemia: No bleeding or clotting complications.  On anagrelide and hydroxyurea.  Most recent platelet count was borderline  elevated at 430 9K.   Medication Adjustments/Labs and Tests Ordered: Current medicines are reviewed at length with the patient today.  Concerns regarding medicines are outlined above.   Tests Ordered: No orders of the defined types were placed in this encounter.   Medication Changes: No orders of the defined types were placed in this encounter.  Patient Instructions  Medication Instructions:  Take the Amlodipine at night  *If you need a refill on your cardiac medications before your next appointment, please call your pharmacy*   Lab Work: None ordered If you have labs (blood work) drawn today and your tests  are completely normal, you will receive your results only by: MyChart Message (if you have MyChart) OR A paper copy in the mail If you have any lab test that is abnormal or we need to change your treatment, we will call you to review the results.   Testing/Procedures: None ordered   Follow-Up: At [facility], you and your health needs are our priority.  As part of our continuing mission to provide you with exceptional heart care, we have created designated Provider Care Teams.  These Care Teams include your primary Cardiologist (physician) and Advanced Practice Providers (APPs -  Physician Assistants and Nurse Practitioners) who all work together to provide you with the care you need, when you need it.  We recommend signing up for the patient portal called "MyChart".  Sign up information is provided on this After Visit Summary.  MyChart is used to connect with patients for Virtual Visits (Telemedicine).  Patients are able to view lab/test results, encounter notes, upcoming appointments, etc.  Non-urgent messages can be sent to your provider as well.   To learn more about what you can do with MyChart, go to [email or web address].    Your next appointment:   12 month(s)  The format for your next appointment:   In Person  Provider:   You may see Sanda Klein, MD or  one of the following Advanced Practice Providers on your designated Care Team:   Almyra Deforest, PA-C Fabian Sharp, [place] or  Roby Lofts, [place]    Follow Up:  Virtual Visit or In Person  1 year  Signed, Sanda Klein, MD  05/13/2021 3:06 PM    [place]

## 2021-05-12 NOTE — Telephone Encounter (Signed)
A written prescription was signed.

## 2021-05-12 NOTE — Telephone Encounter (Signed)
Fax confirmation received. 

## 2021-05-12 NOTE — Progress Notes (Signed)
Chronic Care Management Pharmacy Note  05/19/2021 Name:  Sara [name] MRN:  [id number] DOB:  1956/02/06  Summary: Patient reports that she saw her cardiologist and her Amlodipine dose was increased to 5 mg, she also reports checking her BP at home.   Recommendations/Changes made from today's visit: Recommend that patient get shingrix vaccine and second booster shot.   Plan: Continue to log blood pressure readings daily.  Recommend patient receive 2nd booster vaccine based on guidance from the oncologist.    Subjective: Sara [name] is an 65 y.o. year old female who is a primary patient of [name], MD.  The CCM team was consulted for assistance with disease management and care coordination needs.    Engaged with patient by telephone for follow up visit in response to provider referral for pharmacy case management and/or care coordination services. She reports during the mammogram they noticed she had lost a bunch of weight. She went from size 18-20 to a size 14 or 12.   Consent to Services:  The patient was given information about Chronic Care Management services, agreed to services, and gave verbal consent prior to initiation of services.  Please see initial visit note for detailed documentation.   Patient Care Team: [name], MD as PCP - General (Internal Medicine) Croitoru, Dani Gobble, MD as PCP - Cardiology (Cardiology) Mayford Knife, [facility] (Pharmacist) Lynne Logan, RN as [place] Management  Recent office visits: 04/22/2021 PCP OV  Recent consult visits: 05/12/2021 Cardiology OV 04/29/2021 Neurology OV  ED visits: 04/17/2021 -seizure episode  Objective:  Lab Results  Component Value Date   CREATININE 1.37 (H) 03/03/2021   BUN 22 03/03/2021   GFRNONAA 43 (L) 03/03/2021   GFRAA 49 (L) 11/16/2020   NA 139 03/03/2021   K 3.9 03/03/2021   CALCIUM 9.6 03/03/2021   CO2 27 03/03/2021   GLUCOSE 82 03/03/2021    Lab Results   Component Value Date/Time   HGBA1C 5.9 (H) 03/18/2021 12:28 PM   HGBA1C 6.1 (H) 11/04/2020 04:28 PM   MICROALBUR 10 11/04/2020 05:22 PM   MICROALBUR 80 03/13/2019 12:10 PM    Last diabetic Eye exam:  Lab Results  Component Value Date/Time   HMDIABEYEEXA No Retinopathy 04/14/2020 12:00 AM    Last diabetic Foot exam: No results found for: HMDIABFOOTEX   Lab Results  Component Value Date   CHOL 111 11/04/2020   HDL 47 11/04/2020   LDLCALC 54 11/04/2020   TRIG 39 11/04/2020   CHOLHDL 2.4 11/04/2020    Hepatic Function Latest Ref Rng & Units 03/03/2021 01/22/2021 01/14/2021  Total Protein 6.5 - 8.1 g/dL 6.6 6.3(L) 7.0  Albumin 3.5 - 5.0 g/dL 3.4(L) 2.8(L) 4.3  AST 15 - 41 U/L _0 ALT 0 - 44 U/L _1 Alk Phosphatase 38 - 126 U/L 75 67 92  Total Bilirubin 0.3 - 1.2 mg/dL 0.5 1.0 0.4    Lab Results  Component Value Date/Time   TSH 0.845 01/22/2021 05:45 PM   TSH 1.650 01/14/2021 03:21 PM   TSH 2.060 11/22/2018 05:14 PM   FREET4 1.24 11/22/2018 05:14 PM    CBC Latest Ref Rng & Units 03/03/2021 02/04/2021 01/28/2021  WBC 4.0 - 10.5 K/uL 6.2 6.1 16.8(H)  Hemoglobin 12.0 - 15.0 g/dL 12.4 12.0 12.1  Hematocrit 36.0 - 46.0 % 37.4 35.3 35.7(L)  Platelets 150 - 400 K/uL 439(H) 695(H) 363    No results found for: VD25OH  Clinical ASCVD:  Yes  The ASCVD Risk score Mikey Bussing DC Jr., et al., 2013) failed to calculate for the following reasons:   The patient has a prior MI or stroke diagnosis    Depression screen [facility] 2/9 11/04/2020 09/02/2019 03/13/2019  Decreased Interest 0 0 0  Down, Depressed, Hopeless 0 0 0  PHQ - 2 Score 0 0 0  Altered sleeping - - -  Tired, decreased energy - - -  Change in appetite - - -  Feeling bad or failure about yourself  - - -  Trouble concentrating - - -  Moving slowly or fidgety/restless - - -  Suicidal thoughts - - -  PHQ-9 Score - - -  Difficult doing work/chores - - -  Some recent data might be hidden     Social History   Tobacco Use   Smoking Status Never  Smokeless Tobacco Never   BP Readings from Last 3 Encounters:  05/12/21 (!) 142/82  04/29/21 (!) 188/83  04/28/21 134/80   Pulse Readings from Last 3 Encounters:  05/12/21 70  04/29/21 66  04/28/21 74   Wt Readings from Last 3 Encounters:  05/12/21 165 lb (74.8 kg)  04/29/21 163 lb 6.4 oz (74.1 kg)  04/28/21 164 lb 3.2 oz (74.5 kg)   BMI Readings from Last 3 Encounters:  05/12/21 30.18 kg/m  04/29/21 29.89 kg/m  04/28/21 30.03 kg/m    Assessment/Interventions: Review of patient past medical history, allergies, medications, health status, including review of consultants reports, laboratory and other test data, was performed as part of comprehensive evaluation and provision of chronic care management services.   SDOH:  (Social Determinants of Health) assessments and interventions performed: No  SDOH Screenings   Alcohol Screen: Low Risk    Last Alcohol Screening Score (AUDIT): 0  Depression (PHQ2-9): Low Risk    PHQ-2 Score: 0  Financial Resource Strain: Low Risk    Difficulty of Paying Living Expenses: Not hard at all  Food Insecurity: No Food Insecurity   Worried About Charity fundraiser in the Last Year: Never true   Ran Out of Food in the Last Year: Never true  Housing: Low Risk    Last Housing Risk Score: 0  Physical Activity: Insufficiently Active   Days of Exercise per Week: 2 days   Minutes of Exercise per Session: 30 min  Social Connections: Engineer, building services of Communication with Friends and Family: More than three times a week   Frequency of Social Gatherings with Friends and Family: Not on file   Attends Religious Services: More than 4 times per year   Active Member of [employer] or Organizations: Yes   Attends Music therapist: More than 4 times per year   Marital Status: Married  Stress: No Stress Concern Present   Feeling of Stress : Not at all  Tobacco Use: Low Risk    Smoking Tobacco Use: Never    Smokeless Tobacco Use: Never  Transportation Needs: No Transportation Needs   Lack of Transportation (Medical): No   Lack of Transportation (Non-Medical): No    CCM Care Plan  Allergies  Allergen Reactions   Sulfa Antibiotics Itching    Medications Reviewed Today     Reviewed by Orma Render, CMA (Certified Medical Assistant) on 05/12/21 at 57  Med List Status: <None>   Medication Order Taking? Sig Documenting Provider Last Dose Status Informant  amLODipine (NORVASC) 2.5 MG tablet [id number] Yes Take 1 tablet (2.5 mg total) by mouth daily.  Moore, Janece, FNP Taking Active   anagrelide ([name]) 1 MG capsule [id number] Yes TAKE 1 CAPSULE(1 MG) BY MOUTH DAILY Feng, Yan, MD Taking Active   aspirin EC 81 MG tablet [id number] Yes Take 1 tablet (81 mg total) by mouth daily. Swallow whole. Sanders, Robyn, MD Taking Active   atorvastatin (LIPITOR) 40 MG tablet [id number] Yes TAKE 1 TABLET BY MOUTH DAILY AT 6 PM Croitoru, Mihai, MD Taking Active Self  Cholecalciferol (VITAMIN D3) 2000 UNITS capsule [id number] Yes Take 2,000 Units by mouth daily.  [provider] Taking Active Self  Lacosamide ([name]) 150 MG TABS [id number] Yes Take 1 tablet (150 mg total) by mouth 2 (two) times daily. Willis, Charles K, MD Taking Active   metoprolol tartrate (LOPRESSOR) 50 MG tablet [id number] Yes Take 1.5 tablets (75 mg total) by mouth 2 (two) times daily. Croitoru, Mihai, MD Taking Active Self  Multiple Vitamin (MULTIVITAMIN) capsule [id number] Yes Take 1 capsule by mouth daily. [provider] Taking Active Self  Specialty Vitamins Products (MAGNESIUM, AMINO ACID CHELATE,) 133 MG tablet [id number] Yes Take 1 tablet by mouth 2 (two) times daily. [provider] Taking Active Self  telmisartan (MICARDIS) 80 MG tablet [id number] Yes TAKE 1 TABLET BY MOUTH EVERY DAY Sanders, Robyn, MD Taking Active   Med List Note (Butler, Justin E, CPhT 01/05/14 2012): CPAP            Patient Active  Problem List   Diagnosis Date Noted   Hypertensive heart and renal disease 04/22/2021   Acute renal failure superimposed on stage 3a chronic kidney disease (HCC) 01/22/2021   Hyponatremia 01/22/2021   Ischemic cardiomyopathy 01/22/2021   Hypokalemia 01/22/2021   Nausea vomiting and diarrhea    SVT (supraventricular tachycardia) (HCC) 11/04/2020   Nephropathy 07/28/2018   Chronic kidney disease, stage 2 (mild) 07/28/2018   History of renal artery stenosis 05/16/2018   CAD s/p CABG  05/16/2018   S/P CABG x 2 04/21/2017   Elevated troponin    NSTEMI (non-ST elevated myocardial infarction) (HCC)    Chest pain 04/15/2017   ACS (acute coronary syndrome) (HCC) 04/15/2017   Seizures (HCC) 01/19/2016   Typical absence seizures 03/28/2014   Sleep apnea with use of continuous positive airway pressure (CPAP) 01/30/2014   Hypercholesterolemia 01/04/2014   OSA (obstructive sleep apnea) 10/28/2013   Syncope 07/21/2013   Type 2 diabetes mellitus (HCC) 07/20/2013   Acute renal failure (HCC) 07/20/2013   History of renal stent 07/20/2013   Hypotension due to hypovolemia 07/20/2013   Chronic diastolic CHF (congestive heart failure) (HCC) 07/20/2013   Essential thrombocythemia (HCC) 04/19/2012   Hypertension 04/19/2012   Renal artery stenosis (right) s/p stent 2012 12/13/2010    Immunization History  Administered Date(s) Administered   Fluad Quad(high Dose 65+) 11/04/2020   Influenza,inj,Quad PF,6+ Mos 07/09/2019   PFIZER(Purple Top)SARS-COV-2 Vaccination 11/30/2019, 12/23/2019, 08/10/2020   Tdap 03/13/2019    Conditions to be addressed/monitored:  Hypertension and Hyperlipidemia  Care Plan : CCM Pharmacy Care Plan  Updates made by ,  J, RPH since 05/19/2021 12:00 AM     Problem: HTN, HLD, ATHEROSCLEROSIS   Priority: High     Long-Range Goal: Disease Management   Recent Progress: On track  Priority: High  Note:     Current Barriers:  Unable to independently monitor  therapeutic efficacy  Pharmacist Clinical Goal(s):  Patient will achieve adherence to monitoring guidelines and medication adherence to achieve therapeutic efficacy through collaboration with PharmD and provider.   Interventions: 1:1 collaboration   with [name], MD regarding development and update of comprehensive plan of care as evidenced by provider attestation and co-signature Inter-disciplinary care team collaboration (see longitudinal plan of care) Comprehensive medication review performed; medication list updated in electronic medical record  Hypertension (BP goal <130/80) -Controlled -Current treatment: Telmisartan 80 mg tablet once per day  Metoprolol Tartrate 50 mg taking 1.5 tablet by mouth two times daily  Amlodipine 2.5 mg - take 2 tablets by mouth daily -Current home readings: 160-170/70 -Current dietary habits: eating whole fruits and vegetables -Current exercise habits: she is walking 30 minutes per day  -Denies hypotensive/hypertensive symptoms -Educated on Importance of home blood pressure monitoring; -Counseled to monitor BP at home 5 days per week and, document, and provide log at future appointments -Recommended to continue current medication  Hyperlipidemia/CAD/ACS: (LDL goal < 70) -Controlled -Current treatment: Atorvastatin 40 mg tablet daily Aspirin 81 mg tablet daily  Metoprolol tartrate 50 mg tablet twice per day Amlodipine 2.5 mg - take 2 tablets by mouth daily Telmisartan 80 mg tablet once per day  -Current dietary patterns: Patient reports she is no longer eating fried fatty foods, she has also lost weight by eating more vegetables -Current exercise habits: she is still walking daily  -Educated on Cholesterol goals;  Exercise goal of 150 minutes per week; -Recommended to continue current medication   Patient Goals/Self-Care Activities Patient will:  - take medications as prescribed  Follow Up Plan: The patient has been provided with  contact information for the care management team and has been advised to call with any health related questions or concerns.        Medication Assistance: None required.  Patient affirms current coverage meets needs.  Compliance/Adherence/Medication fill history: Care Gaps: Shingrix vaccine  Pneumonia vaccine Opthalmology Exam COVID-19 2nd booster  Star-Rating Drugs: Atorvastatin 40 mg tablet Telmisartan 80 mg tablet  Patient's preferred pharmacy is:  [facility] [phone] - [address], [address] [place] [address] Phone: [phone] Fax: [phone]  [place], [place] - [address] [place] [address] Phone: [phone] Fax: [phone]  Uses pill box? Yes Pt endorses 90% compliance  We discussed: Benefits of medication synchronization, packaging and delivery as well as enhanced pharmacist oversight with [employer]. Patient decided to: Continue current medication management strategy  Care Plan and Follow Up Patient Decision:  Patient agrees to Care Plan and Follow-up.  Plan: The patient has been provided with contact information for the care management team and has been advised to call with any health related questions or concerns.   [name], PharmD Clinical Pharmacist [facility] [phone]

## 2021-05-12 NOTE — Telephone Encounter (Signed)
Heather from [facility] called back stating that the Lacosamide ([name]) 150 MG TABS, has to be signed by a physician. Please advise.

## 2021-05-12 NOTE — Patient Instructions (Signed)
Medication Instructions:  Take the Amlodipine at night  *If you need a refill on your cardiac medications before your next appointment, please call your pharmacy*   Lab Work: None ordered If you have labs (blood work) drawn today and your tests are completely normal, you will receive your results only by: [place] (if you have MyChart) OR A paper copy in the mail If you have any lab test that is abnormal or we need to change your treatment, we will call you to review the results.   Testing/Procedures: None ordered   Follow-Up: At [facility], you and your health needs are our priority.  As part of our continuing mission to provide you with exceptional heart care, we have created designated Provider Care Teams.  These Care Teams include your primary Cardiologist (physician) and Advanced Practice Providers (APPs -  Physician Assistants and Nurse Practitioners) who all work together to provide you with the care you need, when you need it.  We recommend signing up for the patient portal called "MyChart".  Sign up information is provided on this After Visit Summary.  MyChart is used to connect with patients for Virtual Visits (Telemedicine).  Patients are able to view lab/test results, encounter notes, upcoming appointments, etc.  Non-urgent messages can be sent to your provider as well.   To learn more about what you can do with MyChart, go to [email or web address].    Your next appointment:   12 month(s)  The format for your next appointment:   In Person  Provider:   You may see Sanda Klein, MD or one of the following Advanced Practice Providers on your designated Care Team:   Almyra Deforest, PA-C Fabian Sharp, PA-C or  Roby Lofts, [place]

## 2021-05-12 NOTE — Telephone Encounter (Signed)
Fax confirmation received [name] [facility] [phone].

## 2021-05-12 NOTE — Telephone Encounter (Signed)
Will get Dr. Jannifer Franklin to sign.

## 2021-05-14 ENCOUNTER — Encounter: Payer: Self-pay | Admitting: Adult Health

## 2021-05-14 DIAGNOSIS — R569 Unspecified convulsions: Secondary | ICD-10-CM

## 2021-05-14 LAB — HM MAMMOGRAPHY

## 2021-05-15 ENCOUNTER — Encounter: Payer: Self-pay | Admitting: Internal Medicine

## 2021-05-19 NOTE — Patient Instructions (Addendum)
Visit Information It was great speaking with you today!  Please let me know if you have any questions about our visit.   Goals Addressed             This Visit's Progress    Track and Manage My Blood Pressure-Hypertension       Timeframe:  Long-Range Goal Priority:  High Start Date:                             Expected End Date:                       Follow Up Date 10/20/20222   In Process: - check blood pressure daily - choose a place to take my blood pressure (home, clinic or office, retail store) - write blood pressure results in a log or diary    Why is this important?   You won't feel high blood pressure, but it can still hurt your blood vessels.  High blood pressure can cause heart or kidney problems. It can also cause a stroke.  Making lifestyle changes like losing a little weight or eating less salt will help.  Checking your blood pressure at home and at different times of the day can help to control blood pressure.  If the doctor prescribes medicine remember to take it the way the doctor ordered.  Call the office if you cannot afford the medicine or if there are questions about it.     Notes:  -Continue to follow up with PCP team and cardiologist.         Patient Care Plan: CCM Pharmacy Care Plan     Problem Identified: HTN, HLD, ATHEROSCLEROSIS   Priority: High     Long-Range Goal: Disease Management   Recent Progress: On track  Priority: High  Note:     Current Barriers:  Unable to independently monitor therapeutic efficacy  Pharmacist Clinical Goal(s):  Patient will achieve adherence to monitoring guidelines and medication adherence to achieve therapeutic efficacy through collaboration with PharmD and provider.   Interventions: 1:1 collaboration with [name], MD regarding development and update of comprehensive plan of care as evidenced by provider attestation and co-signature Inter-disciplinary care team collaboration (see longitudinal  plan of care) Comprehensive medication review performed; medication list updated in electronic medical record  Hypertension (BP goal <130/80) -Controlled -Current treatment: Telmisartan 80 mg tablet once per day  Metoprolol Tartrate 50 mg taking 1.5 tablet by mouth two times daily  Amlodipine 2.5 mg - take 2 tablets by mouth daily -Current home readings: 160-170/70 -Current dietary habits: eating whole fruits and vegetables -Current exercise habits: she is walking 30 minutes per day  -Denies hypotensive/hypertensive symptoms -Educated on Importance of home blood pressure monitoring; -Counseled to monitor BP at home 5 days per week and, document, and provide log at future appointments -Recommended to continue current medication  Hyperlipidemia/CAD/ACS: (LDL goal < 70) -Controlled -Current treatment: Atorvastatin 40 mg tablet daily Aspirin 81 mg tablet daily  Metoprolol tartrate 50 mg tablet twice per day Amlodipine 2.5 mg - take 2 tablets by mouth daily Telmisartan 80 mg tablet once per day  -Current dietary patterns: Patient reports she is no longer eating fried fatty foods, she has also lost weight by eating more vegetables -Current exercise habits: she is still walking daily  -Educated on Cholesterol goals;  Exercise goal of 150 minutes per week; -Recommended to continue current medication  Patient Goals/Self-Care Activities Patient will:  - take medications as prescribed  Follow Up Plan: The patient has been provided with contact information for the care management team and has been advised to call with any health related questions or concerns.        Patient agreed to services and verbal consent obtained.   The patient verbalized understanding of instructions, educational materials, and care plan provided today and agreed to receive a mailed copy of patient instructions, educational materials, and care plan.   [name], PharmD Clinical Pharmacist [facility] [phone]

## 2021-05-21 ENCOUNTER — Encounter: Payer: Self-pay | Admitting: Internal Medicine

## 2021-05-22 ENCOUNTER — Encounter: Payer: Self-pay | Admitting: Adult Health

## 2021-05-22 ENCOUNTER — Encounter: Payer: Self-pay | Admitting: Internal Medicine

## 2021-05-24 ENCOUNTER — Ambulatory Visit: Payer: Self-pay | Admitting: General Surgery

## 2021-05-24 NOTE — Telephone Encounter (Signed)
Please advise patient that I would recommend that we repeat EEG.  I have already placed an order for this.  I would also recommend that we increase her [name] to 200 mg twice a day.  In the past the patient has been very resistant to changing her medication.  However since she did bite her tongue/gum then I would recommend that we increase the medication if amenable I will place the order

## 2021-05-24 NOTE — Telephone Encounter (Signed)
Spoke with patient. She reports she was told she had a seizure x 2 on Saturday morning, 2 AM and 4 AM. Pt states she has been taking lacosamide 150 mg twice a day around 9 AM and 9/9:30 PM.  Patient unsure what may have caused this but states that she had not eaten a lot that day and had increased stress.  Also not sure if she is getting enough sleep.  She has reached out to let her cardiologist know and primary care as well.  Patient wanted to make sure Megan NP was made aware as she has an upcoming hernia repair scheduled for August 25th.  Patient stated her husband had puts her up at her mouth during the episode because he thought maybe her blood sugar was too low.  I advised her that he should not be putting anything in her mouth during the seizure due to risk for aspiration.  She verbalized understanding.  I let her know a message will be sent to [facility] NP and we will get back with her via MyChart (per her request). Pt verbalized appreciation for the call.

## 2021-05-24 NOTE — Telephone Encounter (Signed)
Pt sent duplicate message. See other encounter.

## 2021-05-24 NOTE — Addendum Note (Signed)
Addended by: Trudie Buckler on: 05/24/2021 03:41 PM   Modules accepted: Orders

## 2021-05-25 NOTE — Telephone Encounter (Signed)
Ok to wait till after EEG test

## 2021-05-26 ENCOUNTER — Ambulatory Visit: Payer: Medicare Other | Admitting: Cardiovascular Disease

## 2021-06-01 ENCOUNTER — Ambulatory Visit: Payer: BC Managed Care – PPO | Admitting: Adult Health

## 2021-06-02 ENCOUNTER — Telehealth: Payer: HMO

## 2021-06-02 ENCOUNTER — Other Ambulatory Visit: Payer: Self-pay

## 2021-06-02 ENCOUNTER — Inpatient Hospital Stay: Payer: HMO | Attending: Nurse Practitioner

## 2021-06-02 DIAGNOSIS — R1110 Vomiting, unspecified: Secondary | ICD-10-CM | POA: Insufficient documentation

## 2021-06-02 DIAGNOSIS — Z0000 Encounter for general adult medical examination without abnormal findings: Secondary | ICD-10-CM

## 2021-06-02 DIAGNOSIS — R197 Diarrhea, unspecified: Secondary | ICD-10-CM | POA: Insufficient documentation

## 2021-06-02 DIAGNOSIS — D473 Essential (hemorrhagic) thrombocythemia: Secondary | ICD-10-CM | POA: Insufficient documentation

## 2021-06-02 DIAGNOSIS — D75839 Thrombocytosis, unspecified: Secondary | ICD-10-CM | POA: Diagnosis not present

## 2021-06-02 LAB — CBC WITH DIFFERENTIAL/PLATELET
Abs Immature Granulocytes: 0.01 10*3/uL (ref 0.00–0.07)
Basophils Absolute: 0 10*3/uL (ref 0.0–0.1)
Basophils Relative: 1 %
Eosinophils Absolute: 0.2 10*3/uL (ref 0.0–0.5)
Eosinophils Relative: 4 %
HCT: 42.2 % (ref 36.0–46.0)
Hemoglobin: 13.9 g/dL (ref 12.0–15.0)
Immature Granulocytes: 0 %
Lymphocytes Relative: 27 %
Lymphs Abs: 1.1 10*3/uL (ref 0.7–4.0)
MCH: 27.3 pg (ref 26.0–34.0)
MCHC: 32.9 g/dL (ref 30.0–36.0)
MCV: 82.9 fL (ref 80.0–100.0)
Monocytes Absolute: 0.5 10*3/uL (ref 0.1–1.0)
Monocytes Relative: 11 %
Neutro Abs: 2.3 10*3/uL (ref 1.7–7.7)
Neutrophils Relative %: 57 %
Platelets: 363 10*3/uL (ref 150–400)
RBC: 5.09 MIL/uL (ref 3.87–5.11)
RDW: 13.6 % (ref 11.5–15.5)
WBC: 4.1 10*3/uL (ref 4.0–10.5)
nRBC: 0 % (ref 0.0–0.2)

## 2021-06-09 NOTE — Progress Notes (Signed)
DUE TO COVID-19 ONLY ONE VISITOR IS ALLOWED TO COME WITH YOU AND STAY IN THE WAITING ROOM ONLY DURING PRE OP AND PROCEDURE DAY OF SURGERY.  2 VISITOR  MAY VISIT WITH YOU AFTER SURGERY IN YOUR PRIVATE ROOM DURING VISITING HOURS ONLY!  YOU NEED TO HAVE A COVID 19 TEST ON__8/22/22 ____'@_'$  '@_from'$  8am-3pm _____, THIS TEST MUST BE DONE BEFORE SURGERY,  Covid test is done at [place], [place] [address].  This is a drive thru.  No appt required. Please see map.                 Your procedure is scheduled on:  06/17/21   Report to [facility] Main  Entrance   Report to admitting at    [place] AM     Call this number if you have problems the morning of surgery [phone]    REMEMBER: NO  SOLID FOOD CANDY OR GUM AFTER MIDNIGHT. CLEAR LIQUIDS UNTIL  0800am         . NOTHING BY MOUTH EXCEPT CLEAR LIQUIDS UNTIL    0800am   . PLEASE FINISH ENSURE DRINK PER SURGEON ORDER  WHICH NEEDS TO BE COMPLETED AT  0800am     .      CLEAR LIQUID DIET   Foods Allowed                                                                    Coffee and tea, regular and decaf                            Fruit ices (not with fruit pulp)                                      Iced Popsicles                                    Carbonated beverages, regular and diet                                    Cranberry, grape and apple juices Sports drinks like Gatorade Lightly seasoned clear broth or consume(fat free) Sugar, honey syrup ___________________________________________________________________      BRUSH YOUR TEETH MORNING OF SURGERY AND RINSE YOUR MOUTH OUT, NO CHEWING GUM CANDY OR MINTS.     Take these medicines the morning of surgery with A SIP OF WATER:  Amlodipine, metoprolol  DO NOT TAKE ANY DIABETIC MEDICATIONS DAY OF YOUR SURGERY                               You may not have any metal on your body including hair pins and              piercings  Do not wear jewelry, make-up, lotions,  powders or perfumes, deodorant  Do not wear nail polish on your fingernails.  Do not shave  48 hours prior to surgery.              Men may shave face and neck.   Do not bring valuables to the hospital. [place].  Contacts, dentures or bridgework may not be worn into surgery.  Leave suitcase in the car. After surgery it may be brought to your room.     Patients discharged the day of surgery will not be allowed to drive home. IF YOU ARE HAVING SURGERY AND GOING HOME THE SAME DAY, YOU MUST HAVE AN ADULT TO DRIVE YOU HOME AND BE WITH YOU FOR 24 HOURS. YOU MAY GO HOME BY TAXI OR UBER OR ORTHERWISE, BUT AN ADULT MUST ACCOMPANY YOU HOME AND STAY WITH YOU FOR 24 HOURS.  Name and phone number of your driver:  Special Instructions: N/A              Please read over the following fact sheets you were given: _____________________________________________________________________  [facility] - Preparing for Surgery Before surgery, you can play an important role.  Because skin is not sterile, your skin needs to be as free of germs as possible.  You can reduce the number of germs on your skin by washing with CHG (chlorahexidine gluconate) soap before surgery.  CHG is an antiseptic cleaner which kills germs and bonds with the skin to continue killing germs even after washing. Please DO NOT use if you have an allergy to CHG or antibacterial soaps.  If your skin becomes reddened/irritated stop using the CHG and inform your nurse when you arrive at Short Stay. Do not shave (including legs and underarms) for at least 48 hours prior to the first CHG shower.  You may shave your face/neck. Please follow these instructions carefully:  1.  Shower with CHG Soap the night before surgery and the  morning of Surgery.  2.  If you choose to wash your hair, wash your hair first as usual with your  normal  shampoo.  3.  After you shampoo, rinse your hair and body  thoroughly to remove the  shampoo.                           4.  Use CHG as you would any other liquid soap.  You can apply chg directly  to the skin and wash                       Gently with a scrungie or clean washcloth.  5.  Apply the CHG Soap to your body ONLY FROM THE NECK DOWN.   Do not use on face/ open                           Wound or open sores. Avoid contact with eyes, ears mouth and genitals (private parts).                       Wash face,  Genitals (private parts) with your normal soap.             6.  Wash thoroughly, paying special attention to the area where your surgery  will be performed.  7.  Thoroughly rinse your body with  warm water from the neck down.  8.  DO NOT shower/wash with your normal soap after using and rinsing off  the CHG Soap.                9.  Pat yourself dry with a clean towel.            10.  Wear clean pajamas.            11.  Place clean sheets on your bed the night of your first shower and do not  sleep with pets. Day of Surgery : Do not apply any lotions/deodorants the morning of surgery.  Please wear clean clothes to the hospital/surgery center.  FAILURE TO FOLLOW THESE INSTRUCTIONS MAY RESULT IN THE CANCELLATION OF YOUR SURGERY PATIENT SIGNATURE_________________________________  NURSE SIGNATURE__________________________________  ________________________________________________________________________

## 2021-06-11 ENCOUNTER — Other Ambulatory Visit: Payer: Self-pay | Admitting: Internal Medicine

## 2021-06-11 ENCOUNTER — Encounter (HOSPITAL_COMMUNITY)
Admission: RE | Admit: 2021-06-11 | Discharge: 2021-06-11 | Disposition: A | Discharge status: Home / Self Care | Payer: HMO | Source: Ambulatory Visit | Attending: General Surgery | Admitting: General Surgery

## 2021-06-11 ENCOUNTER — Other Ambulatory Visit: Payer: Self-pay

## 2021-06-11 ENCOUNTER — Encounter (HOSPITAL_COMMUNITY): Payer: Self-pay

## 2021-06-11 DIAGNOSIS — I2510 Atherosclerotic heart disease of native coronary artery without angina pectoris: Secondary | ICD-10-CM | POA: Insufficient documentation

## 2021-06-11 DIAGNOSIS — Z7982 Long term (current) use of aspirin: Secondary | ICD-10-CM | POA: Diagnosis not present

## 2021-06-11 DIAGNOSIS — I129 Hypertensive chronic kidney disease with stage 1 through stage 4 chronic kidney disease, or unspecified chronic kidney disease: Secondary | ICD-10-CM | POA: Insufficient documentation

## 2021-06-11 DIAGNOSIS — N189 Chronic kidney disease, unspecified: Secondary | ICD-10-CM | POA: Diagnosis not present

## 2021-06-11 DIAGNOSIS — Z79899 Other long term (current) drug therapy: Secondary | ICD-10-CM | POA: Insufficient documentation

## 2021-06-11 DIAGNOSIS — Z7901 Long term (current) use of anticoagulants: Secondary | ICD-10-CM | POA: Insufficient documentation

## 2021-06-11 DIAGNOSIS — K439 Ventral hernia without obstruction or gangrene: Secondary | ICD-10-CM | POA: Diagnosis not present

## 2021-06-11 DIAGNOSIS — Z01812 Encounter for preprocedural laboratory examination: Secondary | ICD-10-CM | POA: Diagnosis not present

## 2021-06-11 DIAGNOSIS — I252 Old myocardial infarction: Secondary | ICD-10-CM | POA: Insufficient documentation

## 2021-06-11 HISTORY — DX: Disease of blood and blood-forming organs, unspecified: D75.9

## 2021-06-11 HISTORY — DX: Acute myocardial infarction, unspecified: I21.9

## 2021-06-11 HISTORY — DX: Chronic kidney disease, unspecified: N18.9

## 2021-06-11 HISTORY — DX: Prediabetes: R73.03

## 2021-06-11 HISTORY — DX: Pneumonia, unspecified organism: J18.9

## 2021-06-11 HISTORY — DX: Anxiety disorder, unspecified: F41.9

## 2021-06-11 LAB — CBC
HCT: 46.4 % — ABNORMAL HIGH (ref 36.0–46.0)
Hemoglobin: 14.8 g/dL (ref 12.0–15.0)
MCH: 27 pg (ref 26.0–34.0)
MCHC: 31.9 g/dL (ref 30.0–36.0)
MCV: 84.7 fL (ref 80.0–100.0)
Platelets: 500 10*3/uL — ABNORMAL HIGH (ref 150–400)
RBC: 5.48 MIL/uL — ABNORMAL HIGH (ref 3.87–5.11)
RDW: 13.6 % (ref 11.5–15.5)
WBC: 6.9 10*3/uL (ref 4.0–10.5)
nRBC: 0 % (ref 0.0–0.2)

## 2021-06-11 LAB — BASIC METABOLIC PANEL
Anion gap: 7 (ref 5–15)
BUN: 23 mg/dL (ref 8–23)
CO2: 27 mmol/L (ref 22–32)
Calcium: 10.3 mg/dL (ref 8.9–10.3)
Chloride: 102 mmol/L (ref 98–111)
Creatinine, Ser: 1.22 mg/dL — ABNORMAL HIGH (ref 0.44–1.00)
GFR, Estimated: 49 mL/min — ABNORMAL LOW (ref >60)
Glucose, Bld: 84 mg/dL (ref 70–99)
Potassium: 4.3 mmol/L (ref 3.5–5.1)
Sodium: 136 mmol/L (ref 135–145)

## 2021-06-11 LAB — HEMOGLOBIN A1C
Hgb A1c MFr Bld: 6 % — ABNORMAL HIGH (ref 4.8–5.6)
Mean Plasma Glucose: 125.5 mg/dL

## 2021-06-11 LAB — GLUCOSE, CAPILLARY: Glucose-Capillary: 97 mg/dL (ref 70–99)

## 2021-06-11 NOTE — Progress Notes (Signed)
Pt called back and LVMM to return call.   Attempted to call pt and no answer.

## 2021-06-11 NOTE — Progress Notes (Addendum)
Anesthesia Review:  PCP: DR [name]  LOV 03/18/21  Cardiologist : Dr Loleta Chance  05/12/2021 Preop exam - 03/30/21- Isaac Laud M eng,PA  Kidney- followed by [facility]  Thrombocytosis- followed by Dr Truitt Merle  Neuro- [facility] LOV 04/29/21 Trixie Deis, NP  PFT- 2018  Chest x-ray : 1V 01/22/21  EKG : 04/19/21  Echo :2018  Stress test: Cardiac Cath :  2018  Activity level: can do a flight of stairs without difficulty  Sleep Study/ CPAP : Fasting Blood Sugar :      / Checks Blood Sugar -- times a day:   Blood Thinner/ Instructions /Last Dose: ASA / Instructions/ Last Dose :   81 mg Aspirin  EEG on 06/14/21- Hx of seizures last one was 04/2021  Prediabetic per pt not on meds checks glucose 2x per week 06/11/21-hgba1c-6.0 Covid test- 06/14/21  Asked Pricilla Riffle in regards to Anagrelide.  Pricilla Riffle stated that pt needs to contact provider who handles Anagrelide and let them know she is having surgery.  Nurse attempted x 2 to call pt and unable to leave a message.  Will try again.

## 2021-06-11 NOTE — Progress Notes (Signed)
No answer at [phone] -  Nurse attempted again to reach pt in regards to Anagrelide  prior to surgery.  Will attempt to call again on 06/14/21.

## 2021-06-14 ENCOUNTER — Other Ambulatory Visit: Payer: Self-pay | Admitting: General Surgery

## 2021-06-14 NOTE — Progress Notes (Addendum)
Anesthesia Chart Review   Case: [id number] Date/Time: 06/17/21 1045   Procedure: OPEN VENTRAL HERNIA REPAIR WITH MESH   Anesthesia type: General   Pre-op diagnosis: VENTRAL HERNIA   Location: [place] [facility] / [facility]   Surgeons: Mickeal Skinner, MD       DISCUSSION:65 y.o. never smoker with h/o HTN, OSA, CKD, seizures, CAD (NSTEMI April 15, 2017, LIMA to LAD and SVG to diagonal bypass surgery on April 21, 2017), ventral hernia scheduled for above procedure 06/17/2021 with Dr. Gurney Maxin.   Pt last seen by cardiology 05/12/2021. Per OV note pt is physically active without chest pain.  Last seizure 05/21/2021, advised to increase [name] to '200mg'$  BID. Discussed with neuro who feels pt is stable for surgery.  VS: BP (!) 169/84   Pulse 64   Temp 36.6 C (Oral)   Resp 16   Ht '5\' 1"'$  (1.549 m)   Wt 72.6 kg   SpO2 100%   BMI 30.23 kg/m   PROVIDERS: [name], MD is PCP   Croitoru, Mihair, MD is Cardiologist  LABS: Labs reviewed: Acceptable for surgery. (all labs ordered are listed, but only abnormal results are displayed)  Labs Reviewed  BASIC METABOLIC PANEL - Abnormal; Notable for the following components:      Result Value   Creatinine, Ser 1.22 (*)    GFR, Estimated 49 (*)    All other components within normal limits  CBC - Abnormal; Notable for the following components:   RBC 5.48 (*)    HCT 46.4 (*)    Platelets 500 (*)    All other components within normal limits  HEMOGLOBIN A1C - Abnormal; Notable for the following components:   Hgb A1c MFr Bld 6.0 (*)    All other components within normal limits  GLUCOSE, CAPILLARY     IMAGES:   EKG: 04/19/2021 Rate 66 bpm  NSR Anteroseptal infarct, age undetermined  CV: Echo 04/21/2017  Left ventricle: LV systolic function is mildly reduced with an EF of  45-50%.   Septum: No Patent Foramen Ovale present.   Left atrium: Patent foramen ovale not present.   Aortic valve: The valve is trileaflet. No regurgitation.    Mitral valve: Trace regurgitation.  Past Medical History:  Diagnosis Date   Anxiety    Blood dyscrasia    Chronic kidney disease    Gout    Hyperlipemia    Hypertension    LVH (left ventricular hypertrophy)    12/21/09 Echo -mild asymmetric LVH,EF =>55%   Morbid obesity (HCC)    Myocardial infarction (HCC)    OSA (obstructive sleep apnea)    PAD (peripheral artery disease) ([place])    Pneumonia    Pre-diabetes    Renal artery stenosis ([place]) 12/13/2010   PTCA and Stent - right   Seizures (HCC)    Syncopal episodes    Thrombocytosis     Past Surgical History:  Procedure Laterality Date   BREAST CYST EXCISION     S/P Benign Right Breast Cyst Removal.   BREAST REDUCTION SURGERY  1995   S/P Bilateral breast reduction   [place], 1986   Times  Two.   CORONARY ARTERY BYPASS GRAFT N/A 04/21/2017   Procedure: CORONARY ARTERY BYPASS GRAFTING (CABG) x 2, USING LEFT MAMMARY ARTERY AND RIGHT GREATER SAPHENOUS VEIN HARVESTED ENDOSCOPICALLY;  Surgeon: [name], MD;  Location: [facility];  Service: Open Heart Surgery;  Laterality: N/A;   [place]  left wrist   LEFT HEART CATH AND CORONARY ANGIOGRAPHY N/A 04/17/2017   Procedure: Left Heart Cath and Coronary Angiography;  Surgeon: Jettie Booze, MD;  Location: [facility] CV [facility];  Service: Cardiovascular;  Laterality: N/A;   PARTIAL HYSTERECTOMY  1994   renal artery stent placement Right    TEE WITHOUT CARDIOVERSION N/A 04/21/2017   Procedure: TRANSESOPHAGEAL ECHOCARDIOGRAM (TEE);  Surgeon: [name], MD;  Location: [facility];  Service: Open Heart Surgery;  Laterality: N/A;    MEDICATIONS:  amLODipine (NORVASC) 2.5 MG tablet   anagrelide ([name]) 1 MG capsule   aspirin EC 81 MG tablet   atorvastatin (LIPITOR) 40 MG tablet   Cholecalciferol (VITAMIN D3) 2000 UNITS capsule   Lacosamide ([name]) 150 MG TABS   Magnesium 250 MG TABS   metoprolol tartrate (LOPRESSOR) 50 MG tablet   Multiple  Vitamin (MULTIVITAMIN) capsule   telmisartan (MICARDIS) 80 MG tablet   No current facility-administered medications for this encounter.    Konrad Felix Ward, PA-C [facility] Pre-Surgical Testing [phone]

## 2021-06-15 ENCOUNTER — Other Ambulatory Visit: Payer: Self-pay

## 2021-06-15 ENCOUNTER — Ambulatory Visit: Payer: HMO

## 2021-06-15 DIAGNOSIS — R569 Unspecified convulsions: Secondary | ICD-10-CM | POA: Diagnosis not present

## 2021-06-15 LAB — SARS CORONAVIRUS 2 (TAT 6-24 HRS): SARS Coronavirus 2: NEGATIVE

## 2021-06-16 ENCOUNTER — Telehealth: Payer: Self-pay | Admitting: Neurology

## 2021-06-16 MED ORDER — BUPIVACAINE LIPOSOME 1.3 % IJ SUSP
20.0000 mL | Freq: Once | INTRAMUSCULAR | Status: DC
  Filled 2021-06-16: qty 20

## 2021-06-16 NOTE — Procedures (Signed)
    History:  Sara [name] is a 65 year old patient with a history of seizures and obstructive sleep apnea.  In June 2022, she was noted to have difficulty waking up out of sleep, she was confused.  Many of her seizures in the past have occurred out of sleep, there was concern for a seizure that evening.  The patient is being evaluated for this episode.  This is a routine EEG.  No skull defects are noted.  Medications include amlodipine, [name], aspirin, Lipitor, vitamin D, [name], Lopressor, multivitamins, and Micardis.  EEG classification: Normal awake  Description of the recording: The background rhythms of this recording consists of a fairly well modulated medium amplitude alpha rhythm of 9 Hz that is reactive to eye opening and closure. As the record progresses, the patient appears to remain in the waking state throughout the recording. Photic stimulation was performed, resulting in a bilateral and symmetric photic driving response. Hyperventilation was not performed. At no time during the recording does there appear to be evidence of spike or spike wave discharges or evidence of focal slowing. EKG monitor shows no evidence of cardiac rhythm abnormalities with a heart rate of 66.  Impression: This is a normal EEG recording in the waking state. No evidence of ictal or interictal discharges are seen.

## 2021-06-16 NOTE — Telephone Encounter (Signed)
I called the patient.  EEG study was normal.  The patient is to have a hernia surgery tomorrow.  I see no contraindication to having general anesthesia.  A normal EEG study does not exclude the possibility of epilepsy.

## 2021-06-16 NOTE — Anesthesia Preprocedure Evaluation (Addendum)
Anesthesia Evaluation  Patient identified by MRN, date of birth, ID band Patient awake    Reviewed: Allergy & Precautions, NPO status , Patient's Chart, lab work & pertinent test results  Airway Mallampati: II  TM Distance: >3 FB Neck ROM: Full    Dental no notable dental hx. (+) Teeth Intact, Dental Advisory Given   Pulmonary sleep apnea ,    Pulmonary exam normal breath sounds clear to auscultation       Cardiovascular hypertension, Pt. on medications and Pt. on home beta blockers + CAD and + Past MI (NSTEMI 03/2017)  Normal cardiovascular exam Rhythm:Regular Rate:Normal  2018 Echo  45-50% EF   Neuro/Psych Seizures - (last sz 2 month ago ), Well Controlled,  Anxiety    GI/Hepatic negative GI ROS,   Endo/Other  negative endocrine ROS  Renal/GU negative Renal ROS  negative genitourinary   Musculoskeletal negative musculoskeletal ROS (+)   Abdominal (+) + obese,   Peds  Hematology Lab Results      Component                Value               Date                      WBC                      6.9                 06/11/2021                HGB                      14.8                06/11/2021                HCT                      46.4 (H)            06/11/2021                MCV                      84.7                06/11/2021                PLT                      500 (H)             06/11/2021              Anesthesia Other Findings All: Sulfa  Reproductive/Obstetrics                          Anesthesia Physical Anesthesia Plan  ASA: 3  Anesthesia Plan: General   Post-op Pain Management:    Induction: Intravenous  PONV Risk Score and Plan: 4 or greater and Treatment may vary due to age or medical condition, Ondansetron, Dexamethasone and Midazolam  Airway Management Planned: Oral ETT  Additional Equipment:   Intra-op Plan:   Post-operative Plan: Extubation in  OR  Informed Consent: I have reviewed the patients History and Physical, chart,  labs and discussed the procedure including the risks, benefits and alternatives for the proposed anesthesia with the patient or authorized representative who has indicated his/her understanding and acceptance.     Dental advisory given  Plan Discussed with: CRNA and Anesthesiologist  Anesthesia Plan Comments:        Anesthesia Quick Evaluation

## 2021-06-17 ENCOUNTER — Observation Stay (HOSPITAL_COMMUNITY)
Admission: RE | Admit: 2021-06-17 | Discharge: 2021-06-18 | Disposition: A | Discharge status: Home / Self Care | Payer: HMO | Source: Ambulatory Visit | Attending: General Surgery | Admitting: General Surgery

## 2021-06-17 ENCOUNTER — Ambulatory Visit (HOSPITAL_COMMUNITY): Payer: HMO | Admitting: Physician Assistant

## 2021-06-17 ENCOUNTER — Encounter (HOSPITAL_COMMUNITY)
Admission: RE | Disposition: A | Discharge status: Home / Self Care | Payer: Self-pay | Source: Ambulatory Visit | Attending: General Surgery

## 2021-06-17 ENCOUNTER — Encounter (HOSPITAL_COMMUNITY): Payer: Self-pay | Admitting: General Surgery

## 2021-06-17 ENCOUNTER — Other Ambulatory Visit: Payer: Self-pay

## 2021-06-17 ENCOUNTER — Ambulatory Visit (HOSPITAL_COMMUNITY): Payer: HMO | Admitting: Anesthesiology

## 2021-06-17 DIAGNOSIS — K439 Ventral hernia without obstruction or gangrene: Secondary | ICD-10-CM | POA: Diagnosis present

## 2021-06-17 DIAGNOSIS — N189 Chronic kidney disease, unspecified: Secondary | ICD-10-CM | POA: Insufficient documentation

## 2021-06-17 DIAGNOSIS — R7303 Prediabetes: Secondary | ICD-10-CM | POA: Diagnosis not present

## 2021-06-17 DIAGNOSIS — Z951 Presence of aortocoronary bypass graft: Secondary | ICD-10-CM | POA: Diagnosis not present

## 2021-06-17 DIAGNOSIS — I129 Hypertensive chronic kidney disease with stage 1 through stage 4 chronic kidney disease, or unspecified chronic kidney disease: Secondary | ICD-10-CM | POA: Diagnosis not present

## 2021-06-17 DIAGNOSIS — Z79899 Other long term (current) drug therapy: Secondary | ICD-10-CM | POA: Diagnosis not present

## 2021-06-17 DIAGNOSIS — Z7982 Long term (current) use of aspirin: Secondary | ICD-10-CM | POA: Insufficient documentation

## 2021-06-17 DIAGNOSIS — I252 Old myocardial infarction: Secondary | ICD-10-CM | POA: Insufficient documentation

## 2021-06-17 HISTORY — PX: VENTRAL HERNIA REPAIR: SHX424

## 2021-06-17 LAB — CBC
HCT: 43.8 % (ref 36.0–46.0)
Hemoglobin: 13.8 g/dL (ref 12.0–15.0)
MCH: 27 pg (ref 26.0–34.0)
MCHC: 31.5 g/dL (ref 30.0–36.0)
MCV: 85.5 fL (ref 80.0–100.0)
Platelets: 476 10*3/uL — ABNORMAL HIGH (ref 150–400)
RBC: 5.12 MIL/uL — ABNORMAL HIGH (ref 3.87–5.11)
RDW: 14.6 % (ref 11.5–15.5)
WBC: 10.2 10*3/uL (ref 4.0–10.5)
nRBC: 0 % (ref 0.0–0.2)

## 2021-06-17 LAB — HEMOGLOBIN A1C
Hgb A1c MFr Bld: 6.1 % — ABNORMAL HIGH (ref 4.8–5.6)
Mean Plasma Glucose: 128.37 mg/dL

## 2021-06-17 LAB — CREATININE, SERUM
Creatinine, Ser: 1.28 mg/dL — ABNORMAL HIGH (ref 0.44–1.00)
GFR, Estimated: 46 mL/min — ABNORMAL LOW (ref >60)

## 2021-06-17 SURGERY — REPAIR, HERNIA, VENTRAL
Anesthesia: General | Site: Abdomen

## 2021-06-17 MED ORDER — DEXAMETHASONE SODIUM PHOSPHATE 10 MG/ML IJ SOLN
INTRAMUSCULAR | Status: DC | PRN
  Administered 2021-06-17: 8 mg via INTRAVENOUS

## 2021-06-17 MED ORDER — DEXAMETHASONE SODIUM PHOSPHATE 10 MG/ML IJ SOLN
INTRAMUSCULAR | Status: AC
  Filled 2021-06-17: qty 1

## 2021-06-17 MED ORDER — OXYCODONE HCL 5 MG/5ML PO SOLN: 5.0000 mg | Freq: Once | ORAL | Status: DC | PRN

## 2021-06-17 MED ORDER — ONDANSETRON HCL 4 MG/2ML IJ SOLN
INTRAMUSCULAR | Status: AC
  Filled 2021-06-17: qty 2

## 2021-06-17 MED ORDER — LACTATED RINGERS IV SOLN
INTRAVENOUS | Status: DC
  Administered 2021-06-17: 1000 mL via INTRAVENOUS

## 2021-06-17 MED ORDER — HYDRALAZINE HCL 20 MG/ML IJ SOLN: 10.0000 mg | Freq: Once | INTRAMUSCULAR | Status: AC

## 2021-06-17 MED ORDER — ONDANSETRON 4 MG PO TBDP: 4.0000 mg | ORAL_TABLET | Freq: Four times a day (QID) | ORAL | Status: DC | PRN

## 2021-06-17 MED ORDER — PROPOFOL 10 MG/ML IV BOLUS
INTRAVENOUS | Status: AC
  Filled 2021-06-17: qty 20

## 2021-06-17 MED ORDER — LIDOCAINE 2% (20 MG/ML) 5 ML SYRINGE
INTRAMUSCULAR | Status: AC
  Filled 2021-06-17: qty 5

## 2021-06-17 MED ORDER — ATORVASTATIN CALCIUM 40 MG PO TABS
40.0000 mg | ORAL_TABLET | Freq: Every day | ORAL | Status: DC
  Administered 2021-06-17 – 2021-06-18 (×2): 40 mg via ORAL
  Filled 2021-06-17 (×2): qty 1

## 2021-06-17 MED ORDER — ONDANSETRON HCL 4 MG/2ML IJ SOLN
INTRAMUSCULAR | Status: DC | PRN
  Administered 2021-06-17: 4 mg via INTRAVENOUS

## 2021-06-17 MED ORDER — ONDANSETRON HCL 4 MG/2ML IJ SOLN: 4.0000 mg | Freq: Four times a day (QID) | INTRAMUSCULAR | Status: DC | PRN

## 2021-06-17 MED ORDER — HYDROMORPHONE HCL 1 MG/ML IJ SOLN: 0.2500 mg | INTRAMUSCULAR | Status: DC | PRN

## 2021-06-17 MED ORDER — KETOROLAC TROMETHAMINE 30 MG/ML IJ SOLN: 30.0000 mg | Freq: Once | INTRAMUSCULAR | Status: DC | PRN

## 2021-06-17 MED ORDER — KETOROLAC TROMETHAMINE 30 MG/ML IJ SOLN: 30.0000 mg | Freq: Four times a day (QID) | INTRAMUSCULAR | Status: DC | PRN

## 2021-06-17 MED ORDER — IRBESARTAN 75 MG PO TABS
75.0000 mg | ORAL_TABLET | Freq: Every day | ORAL | Status: DC
  Administered 2021-06-17 – 2021-06-18 (×2): 75 mg via ORAL
  Filled 2021-06-17 (×2): qty 1

## 2021-06-17 MED ORDER — MIDAZOLAM HCL 5 MG/5ML IJ SOLN
INTRAMUSCULAR | Status: DC | PRN
  Administered 2021-06-17: 2 mg via INTRAVENOUS

## 2021-06-17 MED ORDER — OXYCODONE HCL 5 MG PO TABS: 5.0000 mg | ORAL_TABLET | ORAL | Status: DC | PRN

## 2021-06-17 MED ORDER — LIDOCAINE HCL (CARDIAC) PF 100 MG/5ML IV SOSY
PREFILLED_SYRINGE | INTRAVENOUS | Status: DC | PRN
  Administered 2021-06-17: 100 mg via INTRAVENOUS

## 2021-06-17 MED ORDER — POLYETHYLENE GLYCOL 3350 17 G PO PACK: 17.0000 g | PACK | Freq: Every day | ORAL | Status: DC | PRN

## 2021-06-17 MED ORDER — AMLODIPINE BESYLATE 5 MG PO TABS
2.5000 mg | ORAL_TABLET | Freq: Every day | ORAL | Status: DC
  Administered 2021-06-17 – 2021-06-18 (×2): 2.5 mg via ORAL
  Filled 2021-06-17 (×2): qty 1

## 2021-06-17 MED ORDER — FENTANYL CITRATE (PF) 100 MCG/2ML IJ SOLN
INTRAMUSCULAR | Status: AC
  Filled 2021-06-17: qty 2

## 2021-06-17 MED ORDER — LACOSAMIDE 50 MG PO TABS
150.0000 mg | ORAL_TABLET | Freq: Two times a day (BID) | ORAL | Status: DC
  Administered 2021-06-17 – 2021-06-18 (×2): 150 mg via ORAL
  Filled 2021-06-17 (×2): qty 3

## 2021-06-17 MED ORDER — KETOROLAC TROMETHAMINE 15 MG/ML IJ SOLN
15.0000 mg | INTRAMUSCULAR | Status: AC
  Administered 2021-06-17: 15 mg via INTRAVENOUS
  Filled 2021-06-17: qty 1

## 2021-06-17 MED ORDER — METOPROLOL TARTRATE 5 MG/5ML IV SOLN: 5.0000 mg | Freq: Four times a day (QID) | INTRAVENOUS | Status: DC | PRN

## 2021-06-17 MED ORDER — MORPHINE SULFATE (PF) 2 MG/ML IV SOLN: 2.0000 mg | INTRAVENOUS | Status: DC | PRN

## 2021-06-17 MED ORDER — 0.9 % SODIUM CHLORIDE (POUR BTL) OPTIME
TOPICAL | Status: DC | PRN
  Administered 2021-06-17: 1000 mL

## 2021-06-17 MED ORDER — CEFAZOLIN SODIUM-DEXTROSE 2-4 GM/100ML-% IV SOLN
2.0000 g | INTRAVENOUS | Status: AC
  Administered 2021-06-17: 2 g via INTRAVENOUS
  Filled 2021-06-17: qty 100

## 2021-06-17 MED ORDER — OXYCODONE HCL 5 MG PO TABS: 5.0000 mg | ORAL_TABLET | Freq: Once | ORAL | Status: DC | PRN

## 2021-06-17 MED ORDER — BUPIVACAINE-EPINEPHRINE 0.25% -1:200000 IJ SOLN
INTRAMUSCULAR | Status: DC | PRN
  Administered 2021-06-17: 30 mL

## 2021-06-17 MED ORDER — ENOXAPARIN SODIUM 40 MG/0.4ML IJ SOSY
40.0000 mg | PREFILLED_SYRINGE | INTRAMUSCULAR | Status: DC
  Administered 2021-06-18: 40 mg via SUBCUTANEOUS
  Filled 2021-06-17: qty 0.4

## 2021-06-17 MED ORDER — DEXMEDETOMIDINE (PRECEDEX) IN NS 20 MCG/5ML (4 MCG/ML) IV SYRINGE
PREFILLED_SYRINGE | INTRAVENOUS | Status: DC | PRN
  Administered 2021-06-17: 8 ug via INTRAVENOUS
  Administered 2021-06-17: 4 ug via INTRAVENOUS

## 2021-06-17 MED ORDER — LACOSAMIDE 150 MG PO TABS: 150.0000 mg | ORAL_TABLET | Freq: Two times a day (BID) | ORAL | Status: DC

## 2021-06-17 MED ORDER — EPHEDRINE 5 MG/ML INJ
INTRAVENOUS | Status: AC
  Filled 2021-06-17: qty 5

## 2021-06-17 MED ORDER — HYDRALAZINE HCL 20 MG/ML IJ SOLN
INTRAMUSCULAR | Status: AC
  Administered 2021-06-17: 10 mg
  Filled 2021-06-17: qty 1

## 2021-06-17 MED ORDER — TRAMADOL HCL 50 MG PO TABS
50.0000 mg | ORAL_TABLET | Freq: Four times a day (QID) | ORAL | Status: DC | PRN
  Administered 2021-06-17: 50 mg via ORAL
  Filled 2021-06-17: qty 1

## 2021-06-17 MED ORDER — METOPROLOL TARTRATE 50 MG PO TABS
75.0000 mg | ORAL_TABLET | Freq: Two times a day (BID) | ORAL | Status: DC
  Administered 2021-06-17 – 2021-06-18 (×2): 75 mg via ORAL
  Filled 2021-06-17 (×2): qty 1

## 2021-06-17 MED ORDER — SIMETHICONE 80 MG PO CHEW: 40.0000 mg | CHEWABLE_TABLET | Freq: Four times a day (QID) | ORAL | Status: DC | PRN

## 2021-06-17 MED ORDER — MAGNESIUM 250 MG PO TABS: 250.0000 mg | ORAL_TABLET | Freq: Every day | ORAL | Status: DC

## 2021-06-17 MED ORDER — ORAL CARE MOUTH RINSE: 15.0000 mL | Freq: Once | OROMUCOSAL | Status: AC

## 2021-06-17 MED ORDER — MIDAZOLAM HCL 2 MG/2ML IJ SOLN
INTRAMUSCULAR | Status: AC
  Filled 2021-06-17: qty 2

## 2021-06-17 MED ORDER — DEXMEDETOMIDINE (PRECEDEX) IN NS 20 MCG/5ML (4 MCG/ML) IV SYRINGE
PREFILLED_SYRINGE | INTRAVENOUS | Status: AC
  Filled 2021-06-17: qty 5

## 2021-06-17 MED ORDER — BUPIVACAINE-EPINEPHRINE (PF) 0.25% -1:200000 IJ SOLN
INTRAMUSCULAR | Status: AC
  Filled 2021-06-17: qty 30

## 2021-06-17 MED ORDER — ROCURONIUM BROMIDE 10 MG/ML (PF) SYRINGE
PREFILLED_SYRINGE | INTRAVENOUS | Status: AC
  Filled 2021-06-17: qty 10

## 2021-06-17 MED ORDER — SUGAMMADEX SODIUM 200 MG/2ML IV SOLN
INTRAVENOUS | Status: DC | PRN
Start: 2021-06-17 — End: 2021-06-17
  Administered 2021-06-17: 150 mg via INTRAVENOUS

## 2021-06-17 MED ORDER — CHLORHEXIDINE GLUCONATE 0.12 % MT SOLN
15.0000 mL | Freq: Once | OROMUCOSAL | Status: AC
  Administered 2021-06-17: 15 mL via OROMUCOSAL

## 2021-06-17 MED ORDER — ACETAMINOPHEN 500 MG PO TABS
1000.0000 mg | ORAL_TABLET | Freq: Four times a day (QID) | ORAL | Status: DC
  Administered 2021-06-17 – 2021-06-18 (×2): 1000 mg via ORAL
  Filled 2021-06-17 (×2): qty 2

## 2021-06-17 MED ORDER — FENTANYL CITRATE (PF) 100 MCG/2ML IJ SOLN
INTRAMUSCULAR | Status: DC | PRN
  Administered 2021-06-17: 75 ug via INTRAVENOUS
  Administered 2021-06-17: 25 ug via INTRAVENOUS

## 2021-06-17 MED ORDER — CHLORHEXIDINE GLUCONATE CLOTH 2 % EX PADS: 6.0000 | MEDICATED_PAD | Freq: Once | CUTANEOUS | Status: DC

## 2021-06-17 MED ORDER — ACETAMINOPHEN 500 MG PO TABS
1000.0000 mg | ORAL_TABLET | ORAL | Status: AC
  Administered 2021-06-17: 1000 mg via ORAL
  Filled 2021-06-17: qty 2

## 2021-06-17 MED ORDER — EPHEDRINE SULFATE 50 MG/ML IJ SOLN
INTRAMUSCULAR | Status: DC | PRN
  Administered 2021-06-17 (×3): 5 mg via INTRAVENOUS

## 2021-06-17 MED ORDER — ACETAMINOPHEN 500 MG PO TABS
ORAL_TABLET | ORAL | Status: AC
  Filled 2021-06-17: qty 2

## 2021-06-17 MED ORDER — MAGNESIUM OXIDE -MG SUPPLEMENT 400 (240 MG) MG PO TABS
200.0000 mg | ORAL_TABLET | Freq: Every day | ORAL | Status: DC
  Administered 2021-06-18: 200 mg via ORAL
  Filled 2021-06-17: qty 1

## 2021-06-17 MED ORDER — ONDANSETRON HCL 4 MG/2ML IJ SOLN: 4.0000 mg | Freq: Once | INTRAMUSCULAR | Status: DC | PRN

## 2021-06-17 MED ORDER — KCL IN DEXTROSE-NACL 20-5-0.45 MEQ/L-%-% IV SOLN
INTRAVENOUS | Status: DC
  Filled 2021-06-17 (×2): qty 1000

## 2021-06-17 MED ORDER — BUPIVACAINE LIPOSOME 1.3 % IJ SUSP
INTRAMUSCULAR | Status: DC | PRN
  Administered 2021-06-17: 20 mL

## 2021-06-17 MED ORDER — ROCURONIUM BROMIDE 100 MG/10ML IV SOLN
INTRAVENOUS | Status: DC | PRN
  Administered 2021-06-17: 50 mg via INTRAVENOUS

## 2021-06-17 MED ORDER — PROPOFOL 10 MG/ML IV BOLUS
INTRAVENOUS | Status: DC | PRN
  Administered 2021-06-17: 120 mg via INTRAVENOUS

## 2021-06-17 MED ORDER — KETOROLAC TROMETHAMINE 30 MG/ML IJ SOLN
30.0000 mg | Freq: Four times a day (QID) | INTRAMUSCULAR | Status: AC
  Administered 2021-06-17: 30 mg via INTRAVENOUS
  Filled 2021-06-17: qty 1

## 2021-06-17 MED ORDER — AMISULPRIDE (ANTIEMETIC) 5 MG/2ML IV SOLN: 10.0000 mg | Freq: Once | INTRAVENOUS | Status: DC | PRN

## 2021-06-17 SURGICAL SUPPLY — 43 items
ADH SKN CLS APL DERMABOND .7 (GAUZE/BANDAGES/DRESSINGS) ×1
APL PRP STRL LF DISP 70% ISPRP (MISCELLANEOUS) ×1
APL SKNCLS STERI-STRIP NONHPOA (GAUZE/BANDAGES/DRESSINGS)
BAG COUNTER SPONGE SURGICOUNT (BAG) IMPLANT
BAG SPNG CNTER NS LX DISP (BAG)
BENZOIN TINCTURE PRP APPL 2/3 (GAUZE/BANDAGES/DRESSINGS) IMPLANT
CHLORAPREP W/TINT 26 (MISCELLANEOUS) ×2 IMPLANT
COVER SURGICAL LIGHT HANDLE (MISCELLANEOUS) ×2 IMPLANT
DECANTER SPIKE VIAL GLASS SM (MISCELLANEOUS) ×2 IMPLANT
DERMABOND ADVANCED (GAUZE/BANDAGES/DRESSINGS) ×1
DERMABOND ADVANCED .7 DNX12 (GAUZE/BANDAGES/DRESSINGS) IMPLANT
DRAIN CHANNEL 19F RND (DRAIN) IMPLANT
DRAPE LAPAROSCOPIC ABDOMINAL (DRAPES) ×2 IMPLANT
DRSG TELFA 4X10 ISLAND STR (GAUZE/BANDAGES/DRESSINGS) IMPLANT
DRSG TELFA 4X8 ISLAND (GAUZE/BANDAGES/DRESSINGS) IMPLANT
ELECT REM PT RETURN 15FT ADLT (MISCELLANEOUS) ×2 IMPLANT
EVACUATOR SILICONE 100CC (DRAIN) IMPLANT
GLOVE SURG POLYISO LF SZ7 (GLOVE) ×2 IMPLANT
GLOVE SURG UNDER POLY LF SZ7 (GLOVE) ×2 IMPLANT
GOWN STRL REUS W/TWL LRG LVL3 (GOWN DISPOSABLE) ×2 IMPLANT
GOWN STRL REUS W/TWL XL LVL3 (GOWN DISPOSABLE) ×2 IMPLANT
KIT BASIN OR (CUSTOM PROCEDURE TRAY) ×2 IMPLANT
KIT TURNOVER KIT A (KITS) ×2 IMPLANT
MESH HERNIA 6X6 BARD (Mesh General) IMPLANT
MESH HERNIA BARD 6X6 (Mesh General) ×1 IMPLANT
NEEDLE HYPO 22GX1.5 SAFETY (NEEDLE) ×2 IMPLANT
PACK GENERAL/GYN (CUSTOM PROCEDURE TRAY) ×2 IMPLANT
SPONGE DRAIN TRACH 4X4 STRL 2S (GAUZE/BANDAGES/DRESSINGS) IMPLANT
SPONGE T-LAP 18X18 ~~LOC~~+RFID (SPONGE) ×2 IMPLANT
STRIP CLOSURE SKIN 1/2X4 (GAUZE/BANDAGES/DRESSINGS) IMPLANT
SUT ETHILON 2 0 PS N (SUTURE) IMPLANT
SUT MNCRL AB 4-0 PS2 18 (SUTURE) ×2 IMPLANT
SUT NOVA 0 T19/GS 22DT (SUTURE) IMPLANT
SUT NOVA NAB GS-21 0 18 T12 DT (SUTURE) ×2 IMPLANT
SUT PDS AB 0 CT1 36 (SUTURE) ×6 IMPLANT
SUT VIC AB 2-0 CT1 27 (SUTURE)
SUT VIC AB 2-0 CT1 TAPERPNT 27 (SUTURE) IMPLANT
SUT VIC AB 3-0 SH 18 (SUTURE) ×2 IMPLANT
SUT VIC AB 3-0 SH 27 (SUTURE)
SUT VIC AB 3-0 SH 27XBRD (SUTURE) IMPLANT
SYR 20ML LL LF (SYRINGE) ×2 IMPLANT
TOWEL OR 17X26 10 PK STRL BLUE (TOWEL DISPOSABLE) ×2 IMPLANT
TOWEL OR NON WOVEN STRL DISP B (DISPOSABLE) ×2 IMPLANT

## 2021-06-17 NOTE — Anesthesia Postprocedure Evaluation (Signed)
Anesthesia Post Note  Patient: Sara [name]  Procedure(s) Performed: OPEN VENTRAL HERNIA REPAIR WITH MESH (Abdomen)     Patient location during evaluation: PACU Anesthesia Type: General Level of consciousness: awake and alert Pain management: pain level controlled Vital Signs Assessment: post-procedure vital signs reviewed and stable Respiratory status: spontaneous breathing, nonlabored ventilation, respiratory function stable and patient connected to nasal cannula oxygen Cardiovascular status: blood pressure returned to baseline and stable Postop Assessment: no apparent nausea or vomiting Anesthetic complications: no   No notable events documented.  Last Vitals:  Vitals:   06/17/21 1318 06/17/21 1330  BP: (!) 178/89 (!) 179/86  Pulse: 68 68  Resp: 15 16  Temp:    SpO2: 95% 94%    Last Pain:  Vitals:   06/17/21 1330  TempSrc:   PainSc: 0-No pain                 Barnet Glasgow

## 2021-06-17 NOTE — Op Note (Signed)
Preoperative diagnosis: ventral hernia  Postoperative diagnosis: same   Procedure: open ventral hernia repair with mesh in the retrorectus space  Surgeon: Gurney Maxin, M.D.  Asst: none  Anesthesia: general  Indications for procedure: Sara [name] is a 65 y.o. year old female with symptoms of constipation and obstruction related to supraumbilical hernia.  Description of procedure: The patient was brought into the operative suite. Anesthesia was administered with General endotracheal anesthesia. WHO checklist was applied. The patient was then placed in supine position. The area was prepped and draped in the usual sterile fashion.  A supraumbilical vertical incision was made. Cautery was used to dissect through the subcutaneous tissue and the hernia sac was encountered. The hernia was bluntly dissected free of the surrounding tissue. The sac was attempted to reduce but was difficult. The sac was opened and contained the transverse colon. The hernia defect was increased to allow reduction of the colon. The hernia defect was 4 cm. 3-4 cm inferior to the ventral hernia there was an additional small umbilical hernia that was dissected free of the umbilical skin and reduced.  Next, the fascia was incised and the retrorectus space was dissected out on both side. The posterior rectus sheath was closed with 0 PDS. A 15 x 15 cm Bard mesh was placed into the retrorectus space and sutured to the posterior rectal sheath in 8 positions with 0 Novafil. The anterior rectus sheath was closed with 0 PDS in running fashion. The deep space was closed with interrupted with 3-0 vicyl in interrupted fashion. The skin was closed with running subcuticular 4-0 monocryl. Dermabond was put in place. The patient woke and was brought to PACU in stable condition.  Findings: 4 cm ventral hernia, small umbilical hernia  Specimen: none  Implant: 15 x 15 cm Bard mesh   Blood loss: 50 ml  Local anesthesia:  50 ml  Exparel:Marcaine Mix  Complications: none  Gurney Maxin, M.D. General, Bariatric, & Minimally Invasive Surgery [facility]

## 2021-06-17 NOTE — H&P (Signed)
Sara [name] is an 65 y.o. female.   Chief Complaint: hernia HPI: 65 yo female with moderate incisional hernia. She was in the ED for obstruction earlier this year. She presents for repair.  Past Medical History:  Diagnosis Date   Anxiety    Blood dyscrasia    Chronic kidney disease    Gout    Hyperlipemia    Hypertension    LVH (left ventricular hypertrophy)    12/21/09 Echo -mild asymmetric LVH,EF =>55%   Morbid obesity (HCC)    Myocardial infarction (HCC)    OSA (obstructive sleep apnea)    PAD (peripheral artery disease) ([place])    Pneumonia    Pre-diabetes    Renal artery stenosis ([place]) 12/13/2010   PTCA and Stent - right   Seizures (HCC)    Syncopal episodes    Thrombocytosis     Past Surgical History:  Procedure Laterality Date   BREAST CYST EXCISION     S/P Benign Right Breast Cyst Removal.   BREAST REDUCTION SURGERY  1995   S/P Bilateral breast reduction   [place], 1986   Times  Two.   CORONARY ARTERY BYPASS GRAFT N/A 04/21/2017   Procedure: CORONARY ARTERY BYPASS GRAFTING (CABG) x 2, USING LEFT MAMMARY ARTERY AND RIGHT GREATER SAPHENOUS VEIN HARVESTED ENDOSCOPICALLY;  Surgeon: [name], MD;  Location: [facility];  Service: Open Heart Surgery;  Laterality: N/A;   EXTREMITY CYST EXCISION  1993   left wrist   LEFT HEART CATH AND CORONARY ANGIOGRAPHY N/A 04/17/2017   Procedure: Left Heart Cath and Coronary Angiography;  Surgeon: Jettie Booze, MD;  Location: [facility];  Service: Cardiovascular;  Laterality: N/A;   PARTIAL HYSTERECTOMY  1994   renal artery stent placement Right    TEE WITHOUT CARDIOVERSION N/A 04/21/2017   Procedure: TRANSESOPHAGEAL ECHOCARDIOGRAM (TEE);  Surgeon: [name], MD;  Location: [facility];  Service: Open Heart Surgery;  Laterality: N/A;    Family History  Problem Relation Age of Onset   Heart failure Mother    Diabetes Father    Diabetes Sister    Hypertension Sister    Hypertension Sister     Hypertension Sister    Breast cancer Maternal Aunt        2 maternal aunts had breast cancer.   Breast cancer Maternal Grandmother    Social History:  reports that she has never smoked. She has never used smokeless tobacco. She reports that she does not drink alcohol and does not use drugs.  Allergies:  Allergies  Allergen Reactions   Sulfa Antibiotics Itching    Medications Prior to Admission  Medication Sig Dispense Refill   amLODipine (NORVASC) 2.5 MG tablet Take 1 tablet (2.5 mg total) by mouth daily. (Patient taking differently: Take 2.5 mg by mouth daily. Pt takes in the pm) 30 tablet 2   anagrelide ([name]) 1 MG capsule TAKE 1 CAPSULE(1 MG) BY MOUTH DAILY (Patient taking differently: Take 1 mg by mouth daily.) 30 capsule 5   aspirin EC 81 MG tablet Take 81 mg by mouth daily. Swallow whole.     atorvastatin (LIPITOR) 40 MG tablet TAKE 1 TABLET BY MOUTH DAILY AT 6 PM (Patient taking differently: Take 40 mg by mouth every evening.) 90 tablet 2   Cholecalciferol (VITAMIN D3) 2000 UNITS capsule Take 2,000 Units by mouth daily.      Lacosamide ([name]) 150 MG TABS Take 1 tablet (150 mg total) by mouth 2 (two) times  daily. 60 tablet 11   Magnesium 250 MG TABS Take 250 mg by mouth in the morning and at bedtime.     metoprolol tartrate (LOPRESSOR) 50 MG tablet Take 1.5 tablets (75 mg total) by mouth 2 (two) times daily. 90 tablet 3   Multiple Vitamin (MULTIVITAMIN) capsule Take 1 capsule by mouth daily.     telmisartan (MICARDIS) 80 MG tablet TAKE 1 TABLET BY MOUTH EVERY DAY 90 tablet 1    No results found for this or any previous visit (from the past 48 hour(s)). No results found.  Review of Systems  Constitutional:  Negative for chills and fever.  HENT:  Negative for hearing loss.   Respiratory:  Negative for cough.   Cardiovascular:  Negative for chest pain and palpitations.  Gastrointestinal:  Negative for abdominal pain, nausea and vomiting.  Genitourinary:  Negative for  dysuria and urgency.  Musculoskeletal:  Negative for myalgias and neck pain.  Skin:  Negative for rash.  Neurological:  Negative for dizziness and headaches.  Hematological:  Does not bruise/bleed easily.  Psychiatric/Behavioral:  Negative for suicidal ideas.    Blood pressure (!) 188/87, pulse 64, temperature 98.4 F (36.9 C), temperature source Oral, resp. rate 16, height '5\' 1"'$  (1.549 m), weight 72.6 kg, SpO2 98 %. Physical Exam Vitals reviewed.  Constitutional:      Appearance: She is well-developed.  HENT:     Head: Normocephalic and atraumatic.  Eyes:     Conjunctiva/sclera: Conjunctivae normal.     Pupils: Pupils are equal, round, and reactive to light.  Cardiovascular:     Rate and Rhythm: Normal rate and regular rhythm.  Pulmonary:     Effort: Pulmonary effort is normal.     Breath sounds: Normal breath sounds.  Abdominal:     General: Bowel sounds are normal. There is no distension.     Palpations: Abdomen is soft.     Tenderness: There is no abdominal tenderness.     Comments: Periumbilical hernia  Musculoskeletal:        General: Normal range of motion.     Cervical back: Normal range of motion and neck supple.  Skin:    General: Skin is warm and dry.  Neurological:     Mental Status: She is alert and oriented to person, place, and time.  Psychiatric:        Behavior: Behavior normal.     Assessment/Plan 65 yo female with incisional hernia -open incisional hernia repair with mesh -obs stay -ERAS protocol  Mickeal Skinner, MD 06/17/2021, 10:06 AM

## 2021-06-17 NOTE — Transfer of Care (Signed)
Immediate Anesthesia Transfer of Care Note  Patient: LEVORA MURRAH  Procedure(s) Performed: OPEN VENTRAL HERNIA REPAIR WITH MESH (Abdomen)  Patient Location: PACU  Anesthesia Type:General  Level of Consciousness: drowsy  Airway & Oxygen Therapy: Patient Spontanous Breathing and Patient connected to face mask oxygen  Post-op Assessment: Report given to RN and Post -op Vital signs reviewed and stable  Post vital signs: Reviewed and stable  Last Vitals:  Vitals Value Taken Time  BP 147/68 06/17/21 1234  Temp 97.7   Pulse 65 06/17/21 1237  Resp 16 06/17/21 1237  SpO2 100 % 06/17/21 1237  Vitals shown include unvalidated device data.  Last Pain:  Vitals:   06/17/21 0914  TempSrc:   PainSc: 0-No pain         Complications: No notable events documented.

## 2021-06-17 NOTE — Anesthesia Procedure Notes (Signed)
Procedure Name: Intubation Date/Time: 06/17/2021 10:40 AM Performed by: Aline Brochure, CRNA Pre-anesthesia Checklist: Patient identified, Patient being monitored, Timeout performed, Emergency Drugs available and Suction available Patient Re-evaluated:Patient Re-evaluated prior to induction Oxygen Delivery Method: Circle system utilized Preoxygenation: Pre-oxygenation with 100% oxygen Induction Type: IV induction Ventilation: Mask ventilation without difficulty Laryngoscope Size: Mac and 3 Grade View: Grade I Tube type: Oral Tube size: 7.0 mm Number of attempts: 1 Airway Equipment and Method: Stylet Placement Confirmation: ETT inserted through vocal cords under direct vision, positive ETCO2 and breath sounds checked- equal and bilateral Secured at: 21 cm Tube secured with: Tape Dental Injury: Teeth and Oropharynx as per pre-operative assessment

## 2021-06-17 NOTE — Progress Notes (Signed)
PT Cancellation Note  Patient Details Name: Sara [name] MRN: [id number] DOB: Dec 09, 1955   Cancelled Treatment:    Reason Eval/Treat Not Completed: PT screened, no needs identified, will sign off (RN and Patient reports she is mobilizing to/from bathroom independently and pt states she feels like she is at her baseline. Pt has no needs for skilled PT, will sign off at this time. Re-consutl if there is  change in functional status.)  Verner Mould, DPT Acute Rehabilitation Services Office [phone] Pager [phone]

## 2021-06-18 ENCOUNTER — Encounter (HOSPITAL_COMMUNITY): Payer: Self-pay | Admitting: General Surgery

## 2021-06-18 DIAGNOSIS — K439 Ventral hernia without obstruction or gangrene: Secondary | ICD-10-CM | POA: Diagnosis not present

## 2021-06-18 MED ORDER — IBUPROFEN 800 MG PO TABS: 800.0000 mg | ORAL_TABLET | Freq: Three times a day (TID) | ORAL | 0 refills | Status: DC | PRN

## 2021-06-18 MED ORDER — OXYCODONE HCL 5 MG PO TABS: 5.0000 mg | ORAL_TABLET | Freq: Four times a day (QID) | ORAL | 0 refills | Status: DC | PRN

## 2021-06-18 NOTE — Discharge Summary (Signed)
Physician Discharge Summary  SHASTELYN PEMBLE [id number] DOB: 04-Jan-1956 DOA: 06/17/2021  PCP: [name], MD  Admit date: 06/17/2021 Discharge date: 06/18/2021  Recommendations for Outpatient Follow-up:   (include homehealth, outpatient follow-up instructions, specific recommendations for PCP to follow-up on, etc.)   Follow-up Information     Quintessa Simmerman, Arta Bruce, MD Follow up in 3 week(s).   Specialty: General Surgery Contact information: [place] [address] [phone]                Discharge Diagnoses:  Active Problems:   Ventral hernia   Surgical Procedure: ventral hernia repair with mesh  Discharge Condition: Good Disposition: Home  Diet recommendation: reg diet   Hospital Course:  65 yo female underwent open ventral hernia repair with mesh> POst op she was watched overnight in the surgical floor. She did well. She had minimal pain. She ambulated by herself. She tolerated food and was discharged home POD 1.  Discharge Instructions  Discharge Instructions     Call MD for:  difficulty breathing, headache or visual disturbances   Complete by: As directed    Call MD for:  hives   Complete by: As directed    Call MD for:  persistant nausea and vomiting   Complete by: As directed    Call MD for:  redness, tenderness, or signs of infection (pain, swelling, redness, odor or green/yellow discharge around incision site)   Complete by: As directed    Call MD for:  severe uncontrolled pain   Complete by: As directed    Call MD for:  temperature >100.4   Complete by: As directed    Diet - low sodium heart healthy   Complete by: As directed    Discharge wound care:   Complete by: As directed    Ok to shower tomorrow. Glue will likely peel off in 1-3 weeks. No bandage required   Driving Restrictions   Complete by: As directed    No driving while on narcotics   Increase activity slowly   Complete by: As directed     Lifting restrictions   Complete by: As directed    No lifting greater than 20 pounds for 3 weeks      Allergies as of 06/18/2021       Reactions   Sulfa Antibiotics Itching        Medication List     TAKE these medications    amLODipine 2.5 MG tablet Commonly known as: NORVASC Take 1 tablet (2.5 mg total) by mouth daily. What changed: additional instructions   anagrelide 1 MG capsule Commonly known as: [name] TAKE 1 CAPSULE(1 MG) BY MOUTH DAILY What changed: See the new instructions.   aspirin EC 81 MG tablet Take 81 mg by mouth daily. Swallow whole.   atorvastatin 40 MG tablet Commonly known as: LIPITOR TAKE 1 TABLET BY MOUTH DAILY AT 6 PM What changed: when to take this   ibuprofen 800 MG tablet Commonly known as: ADVIL Take 1 tablet (800 mg total) by mouth every 8 (eight) hours as needed.   Lacosamide 150 MG Tabs Commonly known as: [name] Take 1 tablet (150 mg total) by mouth 2 (two) times daily.   Magnesium 250 MG Tabs Take 250 mg by mouth in the morning and at bedtime.   metoprolol tartrate 50 MG tablet Commonly known as: LOPRESSOR Take 1.5 tablets (75 mg total) by mouth 2 (two) times daily.   multivitamin capsule Take 1 capsule by  mouth daily.   oxyCODONE 5 MG immediate release tablet Commonly known as: Oxy IR/ROXICODONE Take 1 tablet (5 mg total) by mouth every 6 (six) hours as needed for severe pain.   telmisartan 80 MG tablet Commonly known as: MICARDIS TAKE 1 TABLET BY MOUTH EVERY DAY   Vitamin D3 50 MCG (2000 UT) capsule Take 2,000 Units by mouth daily.               Discharge Care Instructions  (From admission, onward)           Start     Ordered   06/18/21 0000  Discharge wound care:       Comments: Ok to shower tomorrow. Glue will likely peel off in 1-3 weeks. No bandage required   06/18/21 0725            Follow-up Information     Tracy Gerken, Arta Bruce, MD Follow up in 3 week(s).   Specialty: General  Surgery Contact information: [place] [address] [phone]                  The results of significant diagnostics from this hospitalization (including imaging, microbiology, ancillary and laboratory) are listed below for reference.    Significant Diagnostic Studies: EEG adult  Result Date: 2021/06/22 [name], MD     06/16/2021  5:19 PM History: Pearle Gabel is a 65 year old patient with a history of seizures and obstructive sleep apnea.  In June 2022, she was noted to have difficulty waking up out of sleep, she was confused.  Many of her seizures in the past have occurred out of sleep, there was concern for a seizure that evening.  The patient is being evaluated for this episode. This is a routine EEG.  No skull defects are noted.  Medications include amlodipine, [name], aspirin, Lipitor, vitamin D, [name], Lopressor, multivitamins, and Micardis. EEG classification: Normal awake Description of the recording: The background rhythms of this recording consists of a fairly well modulated medium amplitude alpha rhythm of 9 Hz that is reactive to eye opening and closure. As the record progresses, the patient appears to remain in the waking state throughout the recording. Photic stimulation was performed, resulting in a bilateral and symmetric photic driving response. Hyperventilation was not performed. At no time during the recording does there appear to be evidence of spike or spike wave discharges or evidence of focal slowing. EKG monitor shows no evidence of cardiac rhythm abnormalities with a heart rate of 66. Impression: This is a normal EEG recording in the waking state. No evidence of ictal or interictal discharges are seen.    Labs: Basic Metabolic Panel: Recent Labs  Lab 06/11/21 1141 06/17/21 1312  NA 136  --   K 4.3  --   CL 102  --   CO2 27  --   GLUCOSE 84  --   BUN 23  --   CREATININE 1.22* 1.28*  CALCIUM 10.3  --    Liver  Function Tests: No results for input(s): AST, ALT, ALKPHOS, BILITOT, PROT, ALBUMIN in the last 168 hours.  CBC: Recent Labs  Lab 06/11/21 1141 06/17/21 1312  WBC 6.9 10.2  HGB 14.8 13.8  HCT 46.4* 43.8  MCV 84.7 85.5  PLT 500* 476*    CBG: Recent Labs  Lab 06/11/21 1135  GLUCAP 97    Active Problems:   Ventral hernia   Time coordinating discharge: 15 min

## 2021-06-18 NOTE — Plan of Care (Signed)
Instructions were reviewed with patient. All questions were answered. Patient was transported to main entrance by wheelchair. ° °

## 2021-06-18 NOTE — Discharge Instructions (Signed)
[facility] _______[facility]  UMBILICAL OR INGUINAL HERNIA REPAIR: POST OP INSTRUCTIONS  Always review your discharge instruction sheet given to you by the facility where your surgery was performed. IF YOU HAVE DISABILITY OR FAMILY LEAVE FORMS, YOU MUST BRING THEM TO THE OFFICE FOR PROCESSING.   DO NOT GIVE THEM TO YOUR DOCTOR.  1. A  prescription for pain medication may be given to you upon discharge.  Take your pain medication as prescribed, if needed.  If narcotic pain medicine is not needed, then you may take acetaminophen (Tylenol) or ibuprofen (Advil) as needed. 2. Take your usually prescribed medications unless otherwise directed. If you need a refill on your pain medication, please contact your pharmacy.  They will contact our office to request authorization. Prescriptions will not be filled after 5 pm or on week-ends. 3. You should follow a light diet the first 24 hours after arrival home, such as soup and crackers, etc.  Be sure to include lots of fluids daily.  Resume your normal diet the day after surgery. 4.Most patients will experience some swelling and bruising around the umbilicus or in the groin and scrotum.  Ice packs and reclining will help.  Swelling and bruising can take several days to resolve.  6. It is common to experience some constipation if taking pain medication after surgery.  Increasing fluid intake and taking a stool softener (such as Colace) will usually help or prevent this problem from occurring.  A mild laxative (Milk of Magnesia or Miralax) should be taken according to package directions if there are no bowel movements after 48 hours. 7. Unless discharge instructions indicate otherwise, you may remove your bandages 24-48 hours after surgery, and you may shower at that time.  You may have steri-strips (small skin tapes) in place directly over the incision.  These strips should be left on the skin for 7-10 days.  If your surgeon used skin glue on the  incision, you may shower in 24 hours.  The glue will flake off over the next 2-3 weeks.  Any sutures or staples will be removed at the office during your follow-up visit. 8. ACTIVITIES:  You may resume regular (light) daily activities beginning the next day--such as daily self-care, walking, climbing stairs--gradually increasing activities as tolerated.  You may have sexual intercourse when it is comfortable.  Refrain from any heavy lifting or straining until approved by your doctor.  a.You may drive when you are no longer taking prescription pain medication, you can comfortably wear a seatbelt, and you can safely maneuver your car and apply brakes. b.RETURN TO WORK:   _____________________________________________  9.You should see your doctor in the office for a follow-up appointment approximately 2-3 weeks after your surgery.  Make sure that you call for this appointment within a day or two after you arrive home to insure a convenient appointment time. 10.OTHER INSTRUCTIONS: _________________________    _____________________________________  WHEN TO CALL YOUR DOCTOR: Fever over 101.0 Inability to urinate Nausea and/or vomiting Extreme swelling or bruising Continued bleeding from incision. Increased pain, redness, or drainage from the incision  The clinic staff is available to answer your questions during regular business hours.  Please don't hesitate to call and ask to speak to one of the nurses for clinical concerns.  If you have a medical emergency, go to the nearest emergency room or call 911.  A surgeon from [facility] is always on call at the hospital   [address], [address],   [place], [place]  [address] ?  [address] [address], [place], [place]   [address] [phone] ? [phone] ? FAX [phone] Web site: [email or web address]  

## 2021-06-19 ENCOUNTER — Other Ambulatory Visit: Payer: Self-pay

## 2021-06-19 ENCOUNTER — Encounter: Payer: Self-pay | Admitting: Nurse Practitioner

## 2021-06-19 ENCOUNTER — Telehealth: Payer: Self-pay | Admitting: General Surgery

## 2021-06-19 ENCOUNTER — Encounter: Payer: Self-pay | Admitting: Adult Health

## 2021-06-19 ENCOUNTER — Encounter: Payer: Self-pay | Admitting: Neurology

## 2021-06-19 ENCOUNTER — Encounter: Payer: Self-pay | Admitting: Internal Medicine

## 2021-06-19 ENCOUNTER — Encounter: Payer: Self-pay | Admitting: Hematology

## 2021-06-19 ENCOUNTER — Emergency Department (HOSPITAL_COMMUNITY)
Admission: EM | Admit: 2021-06-19 | Discharge: 2021-06-19 | Disposition: A | Discharge status: Home / Self Care | Payer: HMO | Attending: Emergency Medicine | Admitting: Emergency Medicine

## 2021-06-19 ENCOUNTER — Encounter (HOSPITAL_COMMUNITY): Payer: Self-pay

## 2021-06-19 DIAGNOSIS — Z7982 Long term (current) use of aspirin: Secondary | ICD-10-CM | POA: Diagnosis not present

## 2021-06-19 DIAGNOSIS — G4733 Obstructive sleep apnea (adult) (pediatric): Secondary | ICD-10-CM

## 2021-06-19 DIAGNOSIS — Z951 Presence of aortocoronary bypass graft: Secondary | ICD-10-CM | POA: Diagnosis not present

## 2021-06-19 DIAGNOSIS — E1122 Type 2 diabetes mellitus with diabetic chronic kidney disease: Secondary | ICD-10-CM | POA: Insufficient documentation

## 2021-06-19 DIAGNOSIS — I130 Hypertensive heart and chronic kidney disease with heart failure and stage 1 through stage 4 chronic kidney disease, or unspecified chronic kidney disease: Secondary | ICD-10-CM | POA: Diagnosis not present

## 2021-06-19 DIAGNOSIS — Z79899 Other long term (current) drug therapy: Secondary | ICD-10-CM | POA: Diagnosis not present

## 2021-06-19 DIAGNOSIS — R569 Unspecified convulsions: Secondary | ICD-10-CM | POA: Diagnosis not present

## 2021-06-19 DIAGNOSIS — R410 Disorientation, unspecified: Secondary | ICD-10-CM | POA: Diagnosis not present

## 2021-06-19 DIAGNOSIS — I5032 Chronic diastolic (congestive) heart failure: Secondary | ICD-10-CM | POA: Diagnosis not present

## 2021-06-19 DIAGNOSIS — I2510 Atherosclerotic heart disease of native coronary artery without angina pectoris: Secondary | ICD-10-CM | POA: Diagnosis not present

## 2021-06-19 DIAGNOSIS — N1831 Chronic kidney disease, stage 3a: Secondary | ICD-10-CM | POA: Diagnosis not present

## 2021-06-19 LAB — CBC WITH DIFFERENTIAL/PLATELET
Abs Immature Granulocytes: 0.03 10*3/uL (ref 0.00–0.07)
Basophils Absolute: 0 10*3/uL (ref 0.0–0.1)
Basophils Relative: 1 %
Eosinophils Absolute: 0.1 10*3/uL (ref 0.0–0.5)
Eosinophils Relative: 1 %
HCT: 35.6 % — ABNORMAL LOW (ref 36.0–46.0)
Hemoglobin: 11.5 g/dL — ABNORMAL LOW (ref 12.0–15.0)
Immature Granulocytes: 0 %
Lymphocytes Relative: 11 %
Lymphs Abs: 0.9 10*3/uL (ref 0.7–4.0)
MCH: 27.2 pg (ref 26.0–34.0)
MCHC: 32.3 g/dL (ref 30.0–36.0)
MCV: 84.2 fL (ref 80.0–100.0)
Monocytes Absolute: 0.8 10*3/uL (ref 0.1–1.0)
Monocytes Relative: 10 %
Neutro Abs: 6.1 10*3/uL (ref 1.7–7.7)
Neutrophils Relative %: 77 %
Platelets: 503 10*3/uL — ABNORMAL HIGH (ref 150–400)
RBC: 4.23 MIL/uL (ref 3.87–5.11)
RDW: 14.5 % (ref 11.5–15.5)
WBC: 7.9 10*3/uL (ref 4.0–10.5)
nRBC: 0 % (ref 0.0–0.2)

## 2021-06-19 LAB — COMPREHENSIVE METABOLIC PANEL
ALT: 22 U/L (ref 0–44)
AST: 29 U/L (ref 15–41)
Albumin: 3.2 g/dL — ABNORMAL LOW (ref 3.5–5.0)
Alkaline Phosphatase: 70 U/L (ref 38–126)
Anion gap: 8 (ref 5–15)
BUN: 16 mg/dL (ref 8–23)
CO2: 24 mmol/L (ref 22–32)
Calcium: 9.4 mg/dL (ref 8.9–10.3)
Chloride: 97 mmol/L — ABNORMAL LOW (ref 98–111)
Creatinine, Ser: 1.34 mg/dL — ABNORMAL HIGH (ref 0.44–1.00)
GFR, Estimated: 44 mL/min — ABNORMAL LOW (ref >60)
Glucose, Bld: 99 mg/dL (ref 70–99)
Potassium: 4.2 mmol/L (ref 3.5–5.1)
Sodium: 129 mmol/L — ABNORMAL LOW (ref 135–145)
Total Bilirubin: 0.8 mg/dL (ref 0.3–1.2)
Total Protein: 6.3 g/dL — ABNORMAL LOW (ref 6.5–8.1)

## 2021-06-19 LAB — MAGNESIUM: Magnesium: 1.8 mg/dL (ref 1.7–2.4)

## 2021-06-19 MED ORDER — SODIUM CHLORIDE 0.9 % IV BOLUS
1000.0000 mL | Freq: Once | INTRAVENOUS | Status: AC
  Administered 2021-06-19: 1000 mL via INTRAVENOUS

## 2021-06-19 NOTE — Progress Notes (Signed)
Patient called, reported 3 seizures within few hours, presented to emergency room, was discarged home without intervention.  I have advise to call office next Monday for  appt, please give her a follow-ups

## 2021-06-19 NOTE — Telephone Encounter (Signed)
Pt called stating she has had several seizures since being dc from [facility] from her open [place] with Dr Kieth Brightly. Seizures happened fri-sat. Has h/o sz and takes med. Came to [facility] overnight and was evaluated by EDP. Mild hypoNa. Released this am.  She has attempted to contact the neurologist via mychart. No addl seizures since being released from the ED this am.  I advised her to call [facility] and ask to speak with the doctor on call to see if they had any additional recs. If have ongoing sz, pt advised to return to ED

## 2021-06-19 NOTE — ED Triage Notes (Signed)
65 y/o female presents via [facility] with a PMH of seizure, HTN, HLD, PAD, OSA, pre-diabetes, who presents after a seizure. She is currently on [name] '150mg'$  BID. She has not missed any doses of  her antiepileptic medication. Seizure was witness by husband, 3 seizures close together with post-ictal. She did bite her tongue, bleeding controlled. Extended post-ictal state after her 3rd seizure. Her normal is usually only one seizure and not an extended post-ictal state. Last seizure prior to this episode was June of this year, seen here in ED.   Of note she has just undergone an open ventral hernia repair with Dr. Kieth Brightly on 06/17/21 and discharged yesterday 06/18/21.  EMS Vitals: BP 160/90 HR 86 CBG 130 98% RA 18-20 RR/minute 18g in left [facility]

## 2021-06-19 NOTE — ED Provider Notes (Signed)
[facility] EMERGENCY DEPARTMENT Provider Note   CSN: [id number] Arrival date & time: 06/19/21  0302     History Chief Complaint  Patient presents with   Seizures    Sara [name] is a 65 y.o. female.   Seizures Seizure activity on arrival: no   Seizure type:  Myoclonic Preceding symptoms: no sensation of an aura present and no dizziness   Initial focality:  None Episode characteristics: abnormal movements, tongue biting and unresponsiveness   Postictal symptoms: confusion   Return to baseline: yes   Severity:  Moderate Timing:  Clustered Number of seizures this episode:  3 Progression:  Improving Context: not alcohol withdrawal, not cerebral palsy, not change in medication, not hydrocephalus and not pregnant       Past Medical History:  Diagnosis Date   Anxiety    Blood dyscrasia    Chronic kidney disease    Gout    Hyperlipemia    Hypertension    LVH (left ventricular hypertrophy)    12/21/09 Echo -mild asymmetric LVH,EF =>55%   Morbid obesity (HCC)    Myocardial infarction (HCC)    OSA (obstructive sleep apnea)    PAD (peripheral artery disease) (HCC)    Pneumonia    Pre-diabetes    Renal artery stenosis ([place]) 12/13/2010   PTCA and Stent - right   Seizures (HCC)    Syncopal episodes    Thrombocytosis     Patient Active Problem List   Diagnosis Date Noted   Ventral hernia 06/17/2021   Hypertensive heart and renal disease 04/22/2021   Acute renal failure superimposed on stage 3a chronic kidney disease ([place]) 01/22/2021   Hyponatremia 01/22/2021   Ischemic cardiomyopathy 01/22/2021   Hypokalemia 01/22/2021   Nausea vomiting and diarrhea    SVT (supraventricular tachycardia) ([place]) 11/04/2020   Nephropathy 07/28/2018   Chronic kidney disease, stage 2 (mild) 07/28/2018   History of renal artery stenosis 05/16/2018   CAD s/p CABG  05/16/2018   S/P CABG x 2 04/21/2017   Elevated troponin    NSTEMI (non-ST elevated myocardial  infarction) ([place])    Chest pain 04/15/2017   ACS (acute coronary syndrome) ([place]) 04/15/2017   Seizures ([place]) 01/19/2016   Typical absence seizures 03/28/2014   Sleep apnea with use of continuous positive airway pressure (CPAP) 01/30/2014   Hypercholesterolemia 01/04/2014   OSA (obstructive sleep apnea) 10/28/2013   Syncope 07/21/2013   Type 2 diabetes mellitus ([place]) 07/20/2013   Acute renal failure ([place]) 07/20/2013   History of renal stent 07/20/2013   Hypotension due to hypovolemia 07/20/2013   Chronic diastolic CHF (congestive heart failure) ([place]) 07/20/2013   Essential thrombocythemia ([place]) 04/19/2012   Hypertension 04/19/2012   Renal artery stenosis (right) s/p stent 2012 12/13/2010    Past Surgical History:  Procedure Laterality Date   BREAST CYST EXCISION     S/P Benign Right Breast Cyst Removal.   BREAST REDUCTION SURGERY  1995   S/P Bilateral breast reduction   [place], 1986   Times  Two.   CORONARY ARTERY BYPASS GRAFT N/A 04/21/2017   Procedure: CORONARY ARTERY BYPASS GRAFTING (CABG) x 2, USING LEFT MAMMARY ARTERY AND RIGHT GREATER SAPHENOUS VEIN HARVESTED ENDOSCOPICALLY;  Surgeon: [name], MD;  Location: [facility];  Service: Open Heart Surgery;  Laterality: N/A;   EXTREMITY CYST EXCISION  1993   left wrist   LEFT HEART CATH AND CORONARY ANGIOGRAPHY N/A 04/17/2017   Procedure: Left Heart Cath and Coronary Angiography;  Surgeon: Jettie Booze, MD;  Location: [facility];  Service: Cardiovascular;  Laterality: N/A;   PARTIAL HYSTERECTOMY  1994   renal artery stent placement Right    TEE WITHOUT CARDIOVERSION N/A 04/21/2017   Procedure: TRANSESOPHAGEAL ECHOCARDIOGRAM (TEE);  Surgeon: [name], MD;  Location: [facility];  Service: Open Heart Surgery;  Laterality: N/A;   VENTRAL HERNIA REPAIR N/A 06/17/2021   Procedure: OPEN VENTRAL HERNIA REPAIR WITH MESH;  Surgeon: Kinsinger, Arta Bruce, MD;  Location: [facility];  Service: General;  Laterality:  N/A;     OB History   No obstetric history on file.     Family History  Problem Relation Age of Onset   Heart failure Mother    Diabetes Father    Diabetes Sister    Hypertension Sister    Hypertension Sister    Hypertension Sister    Breast cancer Maternal Aunt        2 maternal aunts had breast cancer.   Breast cancer Maternal Grandmother     Social History   Tobacco Use   Smoking status: Never   Smokeless tobacco: Never  Vaping Use   Vaping Use: Never used  Substance Use Topics   Alcohol use: No    Alcohol/week: 0.0 standard drinks   Drug use: No    Home Medications Prior to Admission medications   Medication Sig Start Date End Date Taking? Authorizing Provider  amLODipine (NORVASC) 2.5 MG tablet Take 1 tablet (2.5 mg total) by mouth daily. Patient taking differently: Take 2.5 mg by mouth daily. Pt takes in the pm 04/22/21 04/22/22  [name], FNP  anagrelide ([name]) 1 MG capsule TAKE 1 CAPSULE(1 MG) BY MOUTH DAILY Patient taking differently: Take 1 mg by mouth daily. 04/29/21   Truitt Merle, MD  aspirin EC 81 MG tablet Take 81 mg by mouth daily. Swallow whole.    [provider]  atorvastatin (LIPITOR) 40 MG tablet TAKE 1 TABLET BY MOUTH DAILY AT 6 PM Patient taking differently: Take 40 mg by mouth every evening. 09/29/20   Croitoru, Mihai, MD  Cholecalciferol (VITAMIN D3) 2000 UNITS capsule Take 2,000 Units by mouth daily.     [provider]  ibuprofen (ADVIL) 800 MG tablet Take 1 tablet (800 mg total) by mouth every 8 (eight) hours as needed. 06/18/21   Kinsinger, Arta Bruce, MD  Lacosamide ([name]) 150 MG TABS Take 1 tablet (150 mg total) by mouth 2 (two) times daily. 05/12/21   [name], MD  Magnesium 250 MG TABS Take 250 mg by mouth in the morning and at bedtime.    [provider]  metoprolol tartrate (LOPRESSOR) 50 MG tablet Take 1.5 tablets (75 mg total) by mouth 2 (two) times daily. 12/21/20   Croitoru, Mihai, MD  Multiple  Vitamin (MULTIVITAMIN) capsule Take 1 capsule by mouth daily.    [provider]  oxyCODONE (OXY IR/ROXICODONE) 5 MG immediate release tablet Take 1 tablet (5 mg total) by mouth every 6 (six) hours as needed for severe pain. 06/18/21   Kinsinger, Arta Bruce, MD  telmisartan (MICARDIS) 80 MG tablet TAKE 1 TABLET BY MOUTH EVERY DAY 06/11/21   [name], MD    Allergies    Sulfa antibiotics  Review of Systems   Review of Systems  Neurological:  Positive for seizures.  All other systems reviewed and are negative.  Physical Exam Updated Vital Signs BP (!) 163/69 (BP Location: Right Arm)   Pulse 74  Temp 98.2 F (36.8 C) (Oral)   Resp 13   Ht '5\' 1"'$  (1.549 m)   Wt 72.6 kg   SpO2 100%   BMI 30.23 kg/m   Physical Exam Vitals and nursing note reviewed.  Constitutional:      Appearance: She is well-developed.  HENT:     Head: Normocephalic and atraumatic.     Nose: Nose normal. No congestion or rhinorrhea.     Mouth/Throat:     Mouth: Mucous membranes are moist.     Pharynx: Oropharynx is clear.  Eyes:     Pupils: Pupils are equal, round, and reactive to light.  Cardiovascular:     Rate and Rhythm: Normal rate and regular rhythm.  Pulmonary:     Effort: No respiratory distress.     Breath sounds: No stridor.  Abdominal:     General: Abdomen is flat. There is no distension.  Musculoskeletal:        General: No swelling or tenderness. Normal range of motion.     Cervical back: Normal range of motion.  Skin:    General: Skin is warm and dry.  Neurological:     General: No focal deficit present.     Mental Status: She is alert.    ED Results / Procedures / Treatments   Labs (all labs ordered are listed, but only abnormal results are displayed) Labs Reviewed  CBC WITH DIFFERENTIAL/PLATELET - Abnormal; Notable for the following components:      Result Value   Hemoglobin 11.5 (*)    HCT 35.6 (*)    Platelets 503 (*)    All other components within normal  limits  COMPREHENSIVE METABOLIC PANEL - Abnormal; Notable for the following components:   Sodium 129 (*)    Chloride 97 (*)    Creatinine, Ser 1.34 (*)    Total Protein 6.3 (*)    Albumin 3.2 (*)    GFR, Estimated 44 (*)    All other components within normal limits  MAGNESIUM    EKG None  Radiology No results found.  Procedures Procedures   Medications Ordered in ED Medications  sodium chloride 0.9 % bolus 1,000 mL (1,000 mLs Intravenous New Bag/Given 06/19/21 [id number])    ED Course  I have reviewed the triage vital signs and the nursing notes.  Pertinent labs & imaging results that were available during my care of the patient were reviewed by me and considered in my medical decision making (see chart for details).    MDM Rules/Calculators/A&P                         Breakthrough seizures. Slightly hyponatremic which may have been related? But not that much lower than baseline. Also didn't eat much the day prior because of a surgery, will add a bit of salt to her diet. Her and husband will call Dr. Jannifer Franklin for follow up.   Final Clinical Impression(s) / ED Diagnoses Final diagnoses:  Seizure [facility])    Rx / [place] Orders ED Discharge Orders     None        Caydn Justen, Corene Cornea, MD 06/19/21 [phone]

## 2021-06-20 ENCOUNTER — Telehealth: Payer: Self-pay | Admitting: Neurology

## 2021-06-20 DIAGNOSIS — Z5181 Encounter for therapeutic drug level monitoring: Secondary | ICD-10-CM

## 2021-06-20 NOTE — Telephone Encounter (Signed)
I tried to call the patient, unable to reach her, I will try to call tomorrow morning.  We will need to get her in for blood work to check [name] level and recheck sodium level, may need to maximize [name] dosing, was recently increased slightly.

## 2021-06-21 ENCOUNTER — Telehealth: Payer: Self-pay

## 2021-06-21 NOTE — Telephone Encounter (Signed)
Sara [name] and [place] RN: Please see previous phone note from Dr. Jannifer Franklin.  He is already reached out to the patient.  He feels that the patient needs to be reassessed for sleep apnea.  please advised the patient that we can repeat a home sleep test to verify if she still has sleep apnea.  If she is amenable to this the order can be placed.  We will be repeating the home sleep test due to 50 pound weight loss.  I have verified on [facility] the patient has not used her CPAP machine since March.

## 2021-06-21 NOTE — Telephone Encounter (Signed)
Transition Care Management Follow-up Telephone Call Date of discharge and from where: 06/19/2021 [facility]  How have you been since you were released from the hospital? Pt states shes been feeling okay, she has many concerns after seizure over the weekend. Any questions or concerns? Yes  Items Reviewed: Did the pt receive and understand the discharge instructions provided? Yes  Medications obtained and verified? Yes  Other? Yes  Any new allergies since your discharge? No  Dietary orders reviewed? Yes Do you have support at home? Yes   Home Care and Equipment/Supplies: Were home health services ordered? not applicable If so, what is the name of the agency? N/a   Has the agency set up a time to come to the patient's home? not applicable Were any new equipment or medical supplies ordered?  No What is the name of the medical supply agency? N/a  Were you able to get the supplies/equipment? not applicable Do you have any questions related to the use of the equipment or supplies? No  Functional Questionnaire: (I = Independent and D = Dependent) ADLs: I  Bathing/Dressing- I  Meal Prep- I  Eating- I  Maintaining continence- I  Transferring/Ambulation- I  Managing Meds- I  Follow up appointments reviewed:  PCP Hospital f/u appt confirmed? Yes  Scheduled to see Raman Ghumman on 06/23/2021 @ 4;00pm. [facility] f/u appt confirmed? No  Scheduled to see n/a  on n/a  @ n/a. Are transportation arrangements needed? No  If their condition worsens, is the pt aware to call PCP or go to the Emergency Dept.? Yes Was the patient provided with contact information for the PCP's office or ED? Yes Was to pt encouraged to call back with questions or concerns? Yes

## 2021-06-21 NOTE — Telephone Encounter (Signed)
I called the patient.  The patient recent was in the emergency room with seizures, generally her seizures occur at night while sleeping.  She was also in the emergency room in June with nocturnal seizures.  She has a history of sleep apnea but she went off of this about 3 months ago as she has lost about 50 or 55 pounds.  However, previous to this she had severe sleep apnea, the degree of sleep apnea has not been reassessed since she has gone off of the CPAP.  In the emergency room the sodium level is 129, I will bring her in for blood work to recheck this and check a [name] level.  I think that her sleep apnea needs to be reassessed to make sure that she does not need to continue with the CPAP.  It seems that her seizure frequency has increased since she has come off of CPAP, it is possible that the seizures may be related to the sleep apnea itself.  EEG studies in the past have been normal.

## 2021-06-21 NOTE — Telephone Encounter (Signed)
I called patient.  She had hernia surgery repair last Thursday.  She had some seizures not sure what triggered them questioning anesthesia/medications.  I relayed Megan's message about getting a home sleep test for her since her weight loss of 50 pounds and she has not been using her CPAP since March 2022.  She was fine to proceed with that.  I relayed once that is done, its authorized through insurance and Dr. Rexene Alberts would read the sleep study then we would call her with results. She verbalized understanding.

## 2021-06-23 ENCOUNTER — Encounter: Payer: Self-pay | Admitting: Nurse Practitioner

## 2021-06-23 ENCOUNTER — Ambulatory Visit (INDEPENDENT_AMBULATORY_CARE_PROVIDER_SITE_OTHER): Payer: HMO | Admitting: Nurse Practitioner

## 2021-06-23 ENCOUNTER — Other Ambulatory Visit: Payer: Self-pay

## 2021-06-23 VITALS — BP 136/80 | HR 91 | Temp 98.2°F | Ht 61.0 in | Wt 162.0 lb

## 2021-06-23 DIAGNOSIS — E6609 Other obesity due to excess calories: Secondary | ICD-10-CM | POA: Diagnosis not present

## 2021-06-23 DIAGNOSIS — R569 Unspecified convulsions: Secondary | ICD-10-CM | POA: Diagnosis not present

## 2021-06-23 DIAGNOSIS — Z6830 Body mass index (BMI) 30.0-30.9, adult: Secondary | ICD-10-CM

## 2021-06-23 NOTE — Patient Instructions (Signed)
Seizure, Adult A seizure is a sudden burst of abnormal electrical and chemical activity in the brain. Seizures usually last from 30 seconds to 2 minutes.  What are the causes? Common causes of this condition include: Fever or infection. Problems that affect the brain. These may include: A brain or head injury. Bleeding in the brain. A brain tumor. Low levels of blood sugar or salt. Kidney problems or liver problems. Conditions that are passed from parent to child (are inherited). Problems with a substance, such as: Having a reaction to a drug or a medicine. Stopping the use of a substance all of a sudden (withdrawal). A stroke. Disorders that affect how you develop. Sometimes, the cause may not be known.  What increases the risk? Having someone in your family who has epilepsy. In this condition, seizures happen again and again over time. They have no clear cause. Having had a tonic-clonic seizure before. This type of seizure causes you to: Tighten the muscles of the whole body. Lose consciousness. Having had a head injury or strokes before. Having had a lack of oxygen at birth. What are the signs or symptoms? There are many types of seizures. The symptoms vary depending on the type of seizure you have. Symptoms during a seizure Shaking that you cannot control (convulsions) with fast, jerky movements of muscles. Stiffness of the body. Breathing problems. Feeling mixed up (confused). Staring or not responding to sound or touch. Head nodding. Eyes that blink, flutter, or move fast. Drooling, grunting, or making clicking sounds with your mouth Losing control of when you pee or poop. Symptoms before a seizure Feeling afraid, nervous, or worried. Feeling like you may vomit. Feeling like: You are moving when you are not. Things around you are moving when they are not. Feeling like you saw or heard something before (dj vu). Odd tastes or smells. Changes in how you see. You may  see flashing lights or spots. Symptoms after a seizure Feeling confused. Feeling sleepy. Headache. Sore muscles. How is this treated? If your seizure stops on its own, you will not need treatment. If your seizure lasts longer than 5 minutes, you will normally need treatment. Treatment may include: Medicines given through an IV tube. Avoiding things, such as medicines, that are known to cause your seizures. Medicines to prevent seizures. A device to prevent or control seizures. Surgery. A diet low in carbohydrates and high in fat (ketogenic diet). Follow these instructions at home: Medicines Take over-the-counter and prescription medicines only as told by your doctor. Avoid foods or drinks that may keep your medicine from working, such as alcohol. Activity Follow instructions about driving, swimming, or doing things that would be dangerous if you had another seizure. Wait until your doctor says it is safe for you to do these things. If you live in the [place] ask your local department of motor vehicles when you can drive. Get a lot of rest. Teaching others  Teach friends and family what to do when you have a seizure. They should: Help you get down to the ground. Protect your head and body. Loosen any clothing around your neck. Turn you on your side. Know whether or not you need emergency care. Stay with you until you are better. Also, tell them what not to do if you have a seizure. Tell them: They should not hold you down. They should not put anything in your mouth. General instructions Avoid anything that gives you seizures. Keep a seizure diary. Write down: What you remember   about each seizure. What you think caused each seizure. Keep all follow-up visits. Contact a doctor if: You have another seizure or seizures. Call the doctor each time you have a seizure. The pattern of your seizures changes. You keep having seizures with treatment. You have symptoms of being sick or  having an infection. You are not able to take your medicine. Get help right away if: You have any of these problems: A seizure that lasts longer than 5 minutes. Many seizures in a row and you do not feel better between seizures. A seizure that makes it harder to breathe. A seizure and you can no longer speak or use part of your body. You do not wake up right after a seizure. You get hurt during a seizure. You feel confused or have pain right after a seizure. These symptoms may be an emergency. Get help right away. Call your local emergency services (911 in the [place] Do not wait to see if the symptoms will go away. Do not drive yourself to the hospital. Summary A seizure is a sudden burst of abnormal electrical and chemical activity in the brain. Seizures normally last from 30 seconds to 2 minutes. Causes of seizures include illness, injury to the head, low levels of blood sugar or salt, and certain conditions. Most seizures will stop on their own in less than 5 minutes. Seizures that last longer than 5 minutes are a medical emergency and need treatment right away. Many medicines are used to treat seizures. Take over-the-counter and prescription medicines only as told by your doctor. This information is not intended to replace advice given to you by your health care provider. Make sure you discuss any questions you have with your health care provider. Document Revised: 04/17/2020 Document Reviewed: 04/17/2020 Elsevier Patient Education  [employer].

## 2021-06-23 NOTE — Telephone Encounter (Signed)
I spoke with the patient and got her scheduled with Dr. Rexene Alberts for tomorrow morning September 1 at 8:30 AM arrival 8:00.  Patient has not been using her CPAP since the end of March.  Dr. Jannifer Franklin wants patient to see Dr. Rexene Alberts for follow-up.  I have printed her CPAP report and asked the patient to also bring her machine and power cord to the appointment.  She verbalized appreciation for the call.

## 2021-06-23 NOTE — Progress Notes (Signed)
I,Tianna Badgett,acting as a Education administrator for [employer], NP.,have documented all relevant documentation on the behalf of [employer], NP,as directed by  Bary Castilla, NP while in the presence of Bary Castilla, NP.  This visit occurred during the SARS-CoV-2 public health emergency.  Safety protocols were in place, including screening questions prior to the visit, additional usage of staff PPE, and extensive cleaning of exam room while observing appropriate contact time as indicated for disinfecting solutions.  Subjective:     Patient ID: Sara [name] , female    DOB: 01/24/1956 , 65 y.o.   MRN: [id number]   Chief Complaint  Patient presents with   Follow-up    HPI  Patient is here for a hospital follow up. She was recently in the hospital for seizures. She is follow by a neurologist. She will see the neurologist tomorrow. She states that she is only having seizures during her sleep. She is going to see the neurologist 8:30 am. She is not sure what caused her to have seizures but this happened after her hernia repair. The last time she had a seizure was earlier this year when she was dehydrated.      Past Medical History:  Diagnosis Date   Anxiety    Blood dyscrasia    Chronic kidney disease    Gout    Hyperlipemia    Hypertension    LVH (left ventricular hypertrophy)    12/21/09 Echo -mild asymmetric LVH,EF =>55%   Morbid obesity (HCC)    Myocardial infarction (HCC)    OSA (obstructive sleep apnea)    PAD (peripheral artery disease) (HCC)    Pneumonia    Pre-diabetes    Renal artery stenosis ([place]) 12/13/2010   PTCA and Stent - right   Seizures (HCC)    Syncopal episodes    Thrombocytosis      Family History  Problem Relation Age of Onset   Heart failure Mother    Diabetes Father    Diabetes Sister    Hypertension Sister    Hypertension Sister    Hypertension Sister    Breast cancer Maternal Aunt        2 maternal aunts had breast cancer.    Breast cancer Maternal Grandmother      Current Outpatient Medications:    amLODipine (NORVASC) 2.5 MG tablet, Take 1 tablet (2.5 mg total) by mouth daily. (Patient taking differently: Take 2.5 mg by mouth daily. Pt takes in the pm), Disp: 30 tablet, Rfl: 2   anagrelide ([name]) 1 MG capsule, TAKE 1 CAPSULE(1 MG) BY MOUTH DAILY (Patient taking differently: Take 1 mg by mouth daily.), Disp: 30 capsule, Rfl: 5   aspirin EC 81 MG tablet, Take 81 mg by mouth daily. Swallow whole., Disp: , Rfl:    atorvastatin (LIPITOR) 40 MG tablet, TAKE 1 TABLET BY MOUTH DAILY AT 6 PM (Patient taking differently: Take 40 mg by mouth every evening.), Disp: 90 tablet, Rfl: 2   Cholecalciferol (VITAMIN D3) 2000 UNITS capsule, Take 2,000 Units by mouth daily. , Disp: , Rfl:    ibuprofen (ADVIL) 800 MG tablet, Take 1 tablet (800 mg total) by mouth every 8 (eight) hours as needed., Disp: 30 tablet, Rfl: 0   Lacosamide ([name]) 150 MG TABS, Take 1 tablet (150 mg total) by mouth 2 (two) times daily., Disp: 60 tablet, Rfl: 11   Magnesium 250 MG TABS, Take 250 mg by mouth in the morning and at bedtime., Disp: , Rfl:    metoprolol tartrate (  LOPRESSOR) 50 MG tablet, Take 1.5 tablets (75 mg total) by mouth 2 (two) times daily., Disp: 90 tablet, Rfl: 3   Multiple Vitamin (MULTIVITAMIN) capsule, Take 1 capsule by mouth daily., Disp: , Rfl:    oxyCODONE (OXY IR/ROXICODONE) 5 MG immediate release tablet, Take 1 tablet (5 mg total) by mouth every 6 (six) hours as needed for severe pain., Disp: 20 tablet, Rfl: 0   telmisartan (MICARDIS) 80 MG tablet, TAKE 1 TABLET BY MOUTH EVERY DAY, Disp: 90 tablet, Rfl: 1   Allergies  Allergen Reactions   Sulfa Antibiotics Itching     Review of Systems  Constitutional: Negative.  Negative for chills, fatigue and fever.  HENT:  Negative for congestion.   Respiratory: Negative.  Negative for choking, shortness of breath and wheezing.   Cardiovascular: Negative.  Negative for chest pain.   Gastrointestinal: Negative.   Musculoskeletal:  Negative for arthralgias and myalgias.  Neurological: Negative.  Negative for dizziness, weakness and headaches.    Today's Vitals   06/23/21 1554  BP: 136/80  Pulse: 91  Temp: 98.2 F (36.8 C)  TempSrc: Oral  Weight: 162 lb (73.5 kg)  Height: '5\' 1"'$  (1.549 m)   Body mass index is 30.61 kg/m.  Wt Readings from Last 3 Encounters:  06/23/21 162 lb (73.5 kg)  06/19/21 160 lb (72.6 kg)  06/17/21 160 lb (72.6 kg)    Objective:  Physical Exam Constitutional:      Appearance: Normal appearance.  HENT:     Head: Normocephalic and atraumatic.  Cardiovascular:     Rate and Rhythm: Normal rate and regular rhythm.     Pulses: Normal pulses.     Heart sounds: Normal heart sounds. No murmur heard. Pulmonary:     Effort: Pulmonary effort is normal. No respiratory distress.     Breath sounds: Normal breath sounds. No wheezing.  Skin:    General: Skin is warm and dry.     Capillary Refill: Capillary refill takes less than 2 seconds.  Neurological:     General: No focal deficit present.     Mental Status: She is alert and oriented to person, place, and time.        Assessment And Plan:     1. Seizures ([place]) -Pt was recently in the ED for seizures. She recently had hernia repair surgery and was discharged to home. That night she took ibuprofen and had seizure. Her husband called the ambulance and they took her to the ED. Her sodium level was 129, she did receive IV fluids for that.  Unknown of seizure etiology? Could be drug interaction related to anesthesia? She does have a appt with neurologist tom at 8:30. Will get blood work tom and [name] levels. No changes to her med were made today.    2. Class 1 obesity due to excess calories with serious comorbidity and body mass index (BMI) of 30.0 to 30.9 in adult Advised patient on a healthy diet including avoiding fast food and red meats. Increase the intake of lean meats including grilled  chicken and [place].  Drink a lot of water. Decrease intake of fatty foods. Exercise for 30-45 min. 4-5 a week to decrease the risk of cardiac event.   Follow up: if symptoms persist or do not get better.   The patient was encouraged to call or send a message through [place] for any questions or concerns.   Side effects and appropriate use of all the medication(s) were discussed with the patient today. Patient  advised to use the medication(s) as directed by their healthcare provider. The patient was encouraged to read, review, and understand all associated package inserts and contact our office with any questions or concerns. The patient accepts the risks of the treatment plan and had an opportunity to ask questions.   Patient was given opportunity to ask questions. Patient verbalized understanding of the plan and was able to repeat key elements of the plan. All questions were answered to their satisfaction.  Raman Nimesh Riolo, DNP   I, Raman Ahilyn Nell have reviewed all documentation for this visit. The documentation on 06/23/21 for the exam, diagnosis, procedures, and orders are all accurate and complete.    IF YOU HAVE BEEN REFERRED TO A SPECIALIST, IT MAY TAKE 1-2 WEEKS TO SCHEDULE/PROCESS THE REFERRAL. IF YOU HAVE NOT HEARD FROM US/SPECIALIST IN TWO WEEKS, PLEASE GIVE [place] A CALL AT [phone] X [phone].   THE PATIENT IS ENCOURAGED TO PRACTICE SOCIAL DISTANCING DUE TO THE COVID-19 PANDEMIC.

## 2021-06-24 ENCOUNTER — Encounter: Payer: Self-pay | Admitting: Adult Health

## 2021-06-24 ENCOUNTER — Ambulatory Visit (INDEPENDENT_AMBULATORY_CARE_PROVIDER_SITE_OTHER): Payer: HMO | Admitting: Neurology

## 2021-06-24 ENCOUNTER — Encounter: Payer: Self-pay | Admitting: Neurology

## 2021-06-24 VITALS — BP 134/72 | HR 89 | Ht 61.0 in | Wt 163.4 lb

## 2021-06-24 DIAGNOSIS — Z789 Other specified health status: Secondary | ICD-10-CM

## 2021-06-24 DIAGNOSIS — R569 Unspecified convulsions: Secondary | ICD-10-CM

## 2021-06-24 DIAGNOSIS — Z951 Presence of aortocoronary bypass graft: Secondary | ICD-10-CM

## 2021-06-24 DIAGNOSIS — G4733 Obstructive sleep apnea (adult) (pediatric): Secondary | ICD-10-CM | POA: Diagnosis not present

## 2021-06-24 DIAGNOSIS — R634 Abnormal weight loss: Secondary | ICD-10-CM | POA: Diagnosis not present

## 2021-06-24 NOTE — Progress Notes (Signed)
Subjective:    Patient ID: Sara [name] is a 65 y.o. female.  HPI    Interim history:   Sara [name] is a 65 year old right-handed woman with an underlying medical history of thrombocytosis, hypertension, type 2 diabetes, gout, renal artery stenosis, s/p stent placement in 2013, nocturnal seizures, peripheral artery disease, hyperlipidemia, obesity, left ventricular hypertrophy, heart attack in 2018 with status post three-vessel CABG in June 2018, and prior diagnosis of severe obstructive sleep apnea who presents for re-evaluation of her sleep apnea.  She is no longer on CPAP therapy and was advised to seek reconsultation by Dr. Jannifer Franklin.   I had previously seen her for sleep apnea.  I last saw her on 03/11/2015.  She missed a follow-up appointment on 03/10/2016.  She was compliant with CPAP at the time.  She had an EEG through this office on 06/15/21, and I reviewed the results: Impression: This is a normal EEG recording in the waking state. No evidence of ictal or interictal discharges are seen.  Today, 06/24/2021: She reports that she stopped using her CPAP at the end of March.  She reports that she has had nocturnal seizures.  She is on [name].  Her husband reports that she has a tendency to have episodes of suddenly tensing up her upper body and flexing her arms.  She then makes chewing motions.  She reports that she has 2 wounds on the side of her tongue.  She has not had any bowel or bladder incontinence.  I was able to review her compliance data from the end of February through end of March, she used her machine 29 out of 30 days with percent use days greater than 4 hours at 53% at the time.  Average AHI of 4.9/h at a pressure of 9 cm.  He reports that she had open heart surgery in 2018 and after that she lost a lot of weight, in the realm of 60 pounds.  She had recent hernia surgery.  She reports that given her weight loss she stopped using her machine, and also started giving her discomfort  around the nose.  She was using nasal pillows.  She brought her machine and her mask today.  She does report that she bought a ozone based cleaning device but never actually used it.  She would be willing to get back on CPAP therapy but would also like to be reevaluated and consider getting a new machine as she should be eligible.  Testing in January 2015 revealed severe obstructive sleep apnea with a baseline AHI of 73.9/h, O2 nadir 85%.  Weight was 224 pounds at the time with a BMI of 41.6. She retired from [employer].   Previously:  I reviewed her CPAP compliance data from 02/07/2016 through 03/07/2016 which is a total of 30 days during which time she was her machine every night with percent used days greater than 4 hours at 97%, indicating excellent compliance with an average usage of 6 hours and 34 minutes, residual AHI 1.8 per hour, leaked low with the 95th percentile at 10.8 L/m on a pressure of 9 cm with EPR of 2.  I saw her on 03/11/2015, at which time she was compliant with CPAP therapy and doing well. She had no recent syncopal spell. She had ongoing stress. She was working full-time, her 64 year old son had mental health issues and her sister was living with her. She was eligible for retirement but was not ready to take the step yet. From my end  of things she was doing well and I suggested a one-year checkup.    I saw her on 03/10/2014, at which time she reported doing well with CPAP. She reported a staring spell and decreased responsiveness as witnessed by her daughter and 2 nieces. They took her to [facility] emergency room but because of weight time they actually left. She was compliant with CPAP treatment. I suggested a one-year follow-up. She was advised to follow-up with Dr. Jannifer Franklin as scheduled.  I reviewed her CPAP compliance data from 02/07/2015 through 03/08/2015 which is a total of 30 days during which time she used her machine 29 days with percent used days greater than 4 hours at  93%, indicating excellent compliance with an average usage of 6 hours and 9 minutes for all days, residual AHI low at 2 per hour, leak generally low with the 95th percentile at 11.9 L/m on a pressure of 9 cm with EPR of 2.    I first met her on 11/11/2013 at the request of Dr. Jannifer Franklin at which time I suggested she return for sleep study to get retested for sleep apnea and potentially treated with CPAP. She had stopped using CPAP about a year prior. She reported that CPAP was very cumbersome to use and the pressure felt too high. Her sleep study report from 11/26/2010, which was performed at [facility] showed: Sleep efficiency of 54.95% with a sleep latency of 9 minutes and REM latency normal at 130 minutes. She had a mildly elevated arousal index of 10.7 per hour. She had 6 obstructive apneas and 57 hypopneas. Her overall AHI was 16.4 per hour, rising to 35.3 per hour and REM sleep. The average oxygen saturation was 93% and her nadir was 81%. Mild snoring was reported. She had mild periodic leg movements of sleep at 17.2 per hour with an associated arousal index of 3.4 per hour. She had a split-night sleep study on 11/20/2013 and I went over her test results with her in detail today. Her baseline sleep efficiency was reduced at 57.2% with a latency to sleep of 39.5 minutes and wake after sleep onset of and 15.3% of REM sleep with a normal REM latency. She had no significant periodic leg movements or cardiac arrhythmias. Her total AHI was 73.9 per hour. Oxygen at baseline was 92%, nadir was 85%. She was then titrated on CPAP and had an increased percentage of REM sleep at 27.9%. Average oxygen saturation was 91%, nadir was 85%. She was titrated on CPAP from 4-9 cm and had an AHI of 2.9 events at the final pressure with supine REM sleep achieved. She did have mild periodic leg movements of 15.7 per hour with no significant arousals. I reviewed the patient's CPAP compliance data from  12/13/2013 to 01/11/2014, which is a total of 30 days, during which time the patient used CPAP every day. The average usage for all days was 7 hours and 14 minutes. The percent used days greater than 4 hours was 100 %, indicating superb compliance. The residual AHI was 2 per hour, indicating an appropriate treatment pressure of 9 cwp with EPR of 2. I reviewed her compliance data from 02/08/2014 through 03/09/2014 which is the last 30 days during which time she uses CPAP every night. Percent used days greater than 4 hours was 90%, indicating very good compliance. Average usage was 7 hours and 10 minutes, residual AHI level at 1.7 per hour and leak was low at 8.6 at  the 95th percentile.     Her Past Medical History Is Significant For: Past Medical History:  Diagnosis Date   Anxiety    Blood dyscrasia    Chronic kidney disease    Gout    Hyperlipemia    Hypertension    LVH (left ventricular hypertrophy)    12/21/09 Echo -mild asymmetric LVH,EF =>55%   Morbid obesity (HCC)    Myocardial infarction (HCC)    OSA (obstructive sleep apnea)    PAD (peripheral artery disease) ([place])    Pneumonia    Pre-diabetes    Renal artery stenosis ([place]) 12/13/2010   PTCA and Stent - right   Seizures (HCC)    Syncopal episodes    Thrombocytosis     Her Past Surgical History Is Significant For: Past Surgical History:  Procedure Laterality Date   BREAST CYST EXCISION     S/P Benign Right Breast Cyst Removal.   BREAST REDUCTION SURGERY  1995   S/P Bilateral breast reduction   CESAREAN SECTION  1980, 1986   Times  Two.   CORONARY ARTERY BYPASS GRAFT N/A 04/21/2017   Procedure: CORONARY ARTERY BYPASS GRAFTING (CABG) x 2, USING LEFT MAMMARY ARTERY AND RIGHT GREATER SAPHENOUS VEIN HARVESTED ENDOSCOPICALLY;  Surgeon: [name], MD;  Location: [place];  Service: Open Heart Surgery;  Laterality: N/A;   EXTREMITY CYST EXCISION  1993   left wrist   LEFT HEART CATH AND CORONARY ANGIOGRAPHY N/A 04/17/2017    Procedure: Left Heart Cath and Coronary Angiography;  Surgeon: Jettie Booze, MD;  Location: [place] [facility];  Service: Cardiovascular;  Laterality: N/A;   PARTIAL HYSTERECTOMY  1994   renal artery stent placement Right    TEE WITHOUT CARDIOVERSION N/A 04/21/2017   Procedure: TRANSESOPHAGEAL ECHOCARDIOGRAM (TEE);  Surgeon: [name], MD;  Location: [place];  Service: Open Heart Surgery;  Laterality: N/A;   VENTRAL HERNIA REPAIR N/A 06/17/2021   Procedure: OPEN VENTRAL HERNIA REPAIR WITH MESH;  Surgeon: Kinsinger, Arta Bruce, MD;  Location: [facility];  Service: General;  Laterality: N/A;    Her Family History Is Significant For: Family History  Problem Relation Age of Onset   Heart failure Mother    Diabetes Father    Diabetes Sister    Hypertension Sister    Hypertension Sister    Hypertension Sister    Breast cancer Maternal Aunt        2 maternal aunts had breast cancer.   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    Employer: [employer]  Tobacco Use   Smoking status: Never   Smokeless tobacco: Never  Vaping Use   Vaping Use: Never used  Substance and Sexual Activity   Alcohol use: No    Alcohol/week: 0.0 standard drinks   Drug use: No   Sexual activity: Yes  Other Topics Concern   Not on file  Social History Narrative   Patient lives at husband and her son, home with family.   Patient has two adult children.   Patient is not drinking any caffeine.   Patient is working full-time.   Patient has a college education.   Patient is right-handed.   Social  Determinants of Health   Financial Resource Strain: Low Risk    Difficulty of Paying Living Expenses: Not hard at all  Food  Insecurity: No Food Insecurity   Worried About [place] in the Last Year: Never true   Ran Out of Food in the Last Year: Never true  Transportation Needs: No Transportation Needs   Lack of Transportation (Medical): No   Lack of Transportation (Non-Medical): No  Physical Activity: Insufficiently Active   Days of Exercise per Week: 2 days   Minutes of Exercise per Session: 30 min  Stress: No Stress Concern Present   Feeling of Stress : Not at all  Social Connections: Socially Integrated   Frequency of Communication with Friends and Family: More than three times a week   Frequency of Social Gatherings with Friends and Family: Not on file   Attends Religious Services: More than 4 times per year   Active Member of [employer] or Organizations: Yes   Attends Archivist Meetings: More than 4 times per year   Marital Status: Married    Her Allergies Are:  Allergies  Allergen Reactions   Sulfa Antibiotics Itching  :   Her Current Medications Are:  Outpatient Encounter Medications as of 06/24/2021  Medication Sig   amLODipine (NORVASC) 2.5 MG tablet Take 1 tablet (2.5 mg total) by mouth daily. (Patient taking differently: Take 2.5 mg by mouth daily. Pt takes in the pm)   anagrelide ([name]) 1 MG capsule TAKE 1 CAPSULE(1 MG) BY MOUTH DAILY (Patient taking differently: Take 1 mg by mouth daily.)   aspirin EC 81 MG tablet Take 81 mg by mouth daily. Swallow whole.   atorvastatin (LIPITOR) 40 MG tablet TAKE 1 TABLET BY MOUTH DAILY AT 6 PM (Patient taking differently: Take 40 mg by mouth every evening.)   Cholecalciferol (VITAMIN D3) 2000 UNITS capsule Take 2,000 Units by mouth daily.    ibuprofen (ADVIL) 800 MG tablet Take 1 tablet (800 mg total) by mouth every 8 (eight) hours as needed.   Lacosamide ([name]) 150 MG TABS Take 1 tablet (150 mg total) by mouth 2 (two) times daily.   Magnesium 250 MG TABS Take 250 mg by mouth in the morning and at bedtime.   metoprolol tartrate  (LOPRESSOR) 50 MG tablet Take 1.5 tablets (75 mg total) by mouth 2 (two) times daily.   Multiple Vitamin (MULTIVITAMIN) capsule Take 1 capsule by mouth daily.   oxyCODONE (OXY IR/ROXICODONE) 5 MG immediate release tablet Take 1 tablet (5 mg total) by mouth every 6 (six) hours as needed for severe pain.   telmisartan (MICARDIS) 80 MG tablet TAKE 1 TABLET BY MOUTH EVERY DAY   No facility-administered encounter medications on file as of 06/24/2021.  :  Review of Systems:  Out of a complete 14 point review of systems, all are reviewed and negative with the exception of these symptoms as listed below:  Review of Systems  Neurological:        Pt here to f/u on CPAP. Pt reports she has not used her machine since March due to hospitalization. She also reports 50 lb weight loss over the last year. Dr. Jannifer Franklin had recommended revisit to address.    Objective:  Neurological Exam  Physical Exam Physical Examination:   Vitals:   06/24/21 0809  BP: 134/72  Pulse: 89  SpO2: 98%    General Examination: The patient is a very pleasant 65 y.o. female in no acute distress. She appears well-developed and well-nourished  and well groomed.   HEENT: Normocephalic, atraumatic, pupils are equal, round and reactive to light, extraocular tracking is good without limitation to gaze excursion or nystagmus noted. Normal smooth pursuit is noted. Hearing is grossly intact. Face is symmetric with normal facial animation and normal facial sensation. Speech is clear with no dysarthria noted. There is no hypophonia. There is no lip, neck/head, jaw or voice tremor. Neck is supple with full range of passive and active motion. There are no carotid bruits on auscultation. Oropharynx exam reveals: mild mouth dryness, adequate dental hygiene and moderate airway crowding. Mallampati is class II. Tongue protrudes centrally and palate elevates symmetrically. Tonsils are 1+. Neck size 13 in.   Chest: Clear to auscultation without  wheezing, rhonchi or crackles noted.    Heart: S1+S2+0, regular and normal without murmurs, rubs or gallops noted. Scar midline from sternotomy.    Abdomen: Soft, non-tender and non-distended. Mild protruberance, elastic abdominal belt/garter in place.    Extremities: There is no pitting edema in the distal lower extremities bilaterally.    Skin: Warm and dry without trophic changes noted. There are no varicose veins.   Musculoskeletal: exam reveals no obvious joint deformities.    Neurologically:  Mental status: The patient is awake, alert and oriented in all 4 spheres. Her memory, attention, language and knowledge are appropriate. There is no aphasia, agnosia, apraxia or anomia. Speech is clear with normal prosody and enunciation. Thought process is linear. Mood is congruent and affect is constricted.  Cranial nerves are as described above under HEENT exam. Motor exam: Normal bulk, strength and tone is noted. There is no drift, or tremor. Fine motor skills are intact grossly in the UEs and LEs.   Cerebellar testing shows no dysmetria or intention tremor.  Sensory exam is intact to light touch in the upper and lower extremities.  Gait, station and balance are unremarkable. No veering to one side is noted. No leaning to one side is noted. Posture is age-appropriate and stance is narrow based. No problems turning are noted. She turns en bloc.    Assessment and Plan:    In summary, Sara [name] is a very pleasant 65 year old right-handed woman with an underlying medical history of thrombocytosis, hypertension, type 2 diabetes, gout, renal artery stenosis, s/p stent placement in 2013, nocturnal seizures, peripheral artery disease, hyperlipidemia, obesity, left ventricular hypertrophy, heart attack in 2018 with status post three-vessel CABG in June 2018, and prior diagnosis of severe obstructive sleep apnea who presents for re-evaluation of her sleep apnea.  She is no longer on CPAP therapy and  was advised to seek reconsultation by Dr. Jannifer Franklin.  He was previously compliant with her CPAP of 9 cm.  She was diagnosed with severe obstructive sleep apnea in 2015.  She had subsequent weight loss in the realm of 60 pounds.  She is advised to proceed with reevaluation with a overnight sleep study.  We will consider ongoing treatment with positive airway pressure, she should be eligible for new machine.  She is advised that we will schedule her once we have insurance authorization and we will keep him posted as to her results by phone call and plan a follow-up in sleep clinic accordingly.  I answered all the questions today and the patient and her husband were in agreement. I spent 40 minutes in total face-to-face time and in reviewing records during pre-charting, more than 50% of which was spent in counseling and coordination of care, reviewing test results, reviewing medications and treatment  regimen and/or in discussing or reviewing the diagnosis of OSA, the prognosis and treatment options. Pertinent laboratory and imaging test results that were available during this visit with the patient were reviewed by me and considered in my medical decision making (see chart for details).

## 2021-06-24 NOTE — Patient Instructions (Signed)
It was nice to see you again today.  I do believe you would benefit from reevaluation of your sleep apnea with a laboratory attended sleep study.  As discussed, I will change the order from a home sleep test to the lab test.  Previously, you were very compliant with your CPAP machine but given your weight loss and difficulty tolerating the nasal mask, we should reevaluate you.  If you continue to have sleep apnea, it is possible that it is milder now or in the mild to moderate range.  Your weight loss likely contributed to improving your sleep apnea.  If you still qualify for treatment, you should be eligible for a new machine and I would be happy to write for a new machine after your study.  We will call you to schedule your nighttime sleep study after we get insurance authorization and we will go ahead and cancel your home sleep test that is pending for this month.

## 2021-06-25 ENCOUNTER — Other Ambulatory Visit: Payer: Self-pay | Admitting: Cardiovascular Disease

## 2021-07-07 ENCOUNTER — Telehealth: Payer: Self-pay | Admitting: Hematology

## 2021-07-07 NOTE — Telephone Encounter (Signed)
Unable to leave message with rescheduled upcoming appointment due to provider's breast clinic. Mailed calendar.

## 2021-07-17 ENCOUNTER — Other Ambulatory Visit: Payer: Self-pay | Admitting: Nurse Practitioner

## 2021-07-17 DIAGNOSIS — I10 Essential (primary) hypertension: Secondary | ICD-10-CM

## 2021-07-17 DIAGNOSIS — I130 Hypertensive heart and chronic kidney disease with heart failure and stage 1 through stage 4 chronic kidney disease, or unspecified chronic kidney disease: Secondary | ICD-10-CM

## 2021-07-19 ENCOUNTER — Other Ambulatory Visit: Payer: Self-pay

## 2021-07-19 ENCOUNTER — Encounter: Payer: Self-pay | Admitting: Internal Medicine

## 2021-07-19 ENCOUNTER — Ambulatory Visit (INDEPENDENT_AMBULATORY_CARE_PROVIDER_SITE_OTHER): Payer: HMO | Admitting: Internal Medicine

## 2021-07-19 VITALS — BP 138/80 | HR 76 | Temp 98.4°F | Ht 62.2 in | Wt 161.0 lb

## 2021-07-19 DIAGNOSIS — I2510 Atherosclerotic heart disease of native coronary artery without angina pectoris: Secondary | ICD-10-CM

## 2021-07-19 DIAGNOSIS — Z23 Encounter for immunization: Secondary | ICD-10-CM | POA: Diagnosis not present

## 2021-07-19 DIAGNOSIS — I1310 Hypertensive heart and chronic kidney disease without heart failure, with stage 1 through stage 4 chronic kidney disease, or unspecified chronic kidney disease: Secondary | ICD-10-CM

## 2021-07-19 DIAGNOSIS — E1122 Type 2 diabetes mellitus with diabetic chronic kidney disease: Secondary | ICD-10-CM

## 2021-07-19 DIAGNOSIS — I5032 Chronic diastolic (congestive) heart failure: Secondary | ICD-10-CM | POA: Diagnosis not present

## 2021-07-19 DIAGNOSIS — E663 Overweight: Secondary | ICD-10-CM

## 2021-07-19 DIAGNOSIS — N1831 Chronic kidney disease, stage 3a: Secondary | ICD-10-CM

## 2021-07-19 DIAGNOSIS — G4089 Other seizures: Secondary | ICD-10-CM

## 2021-07-19 MED ORDER — ZOSTER VAC RECOMB ADJUVANTED 50 MCG/0.5ML IM SUSR: 0.5000 mL | Freq: Once | INTRAMUSCULAR | 0 refills | Status: AC

## 2021-07-19 NOTE — Progress Notes (Signed)
Rich Brave [place],acting as a Education administrator for [name], MD.,have documented all relevant documentation on the behalf of [name], MD,as directed by  [name], MD while in the presence of [name], MD.  This visit occurred during the SARS-CoV-2 public health emergency.  Safety protocols were in place, including screening questions prior to the visit, additional usage of staff PPE, and extensive cleaning of exam room while observing appropriate contact time as indicated for disinfecting solutions.  Subjective:     Patient ID: Sara [name] , female    DOB: 1956-01-01 , 65 y.o.   MRN: [id number]   Chief Complaint  Patient presents with   Diabetes   Hypertension    HPI  She is here today for DM/HTN f/u.  She is no longer on meds for diabetes or high blood pressure. She denies having headaches, chest pain and shortness of breath. Unfortunately, she has been having her "episodes' of seizure activity. She is scheduled for her sleep study on Friday.   Diabetes She presents for her follow-up diabetic visit. She has type 2 diabetes mellitus. There are no hypoglycemic associated symptoms. There are no diabetic associated symptoms. Pertinent negatives for diabetes include no blurred vision and no chest pain. There are no hypoglycemic complications. Diabetic complications include heart disease and nephropathy. Risk factors for coronary artery disease include diabetes mellitus, dyslipidemia, hypertension, obesity, post-menopausal and sedentary lifestyle. She is compliant with treatment most of the time. She is following a diabetic diet. She participates in exercise three times a week. Her breakfast blood glucose is taken between 7-8 am. Her breakfast blood glucose range is generally 90-110 mg/dl. An ACE inhibitor/angiotensin II receptor blocker is being taken. Eye exam is current.  Hypertension This is a chronic problem. The current episode started more than 1 year ago. The  problem has been gradually improving since onset. The problem is uncontrolled. Pertinent negatives include no blurred vision, chest pain, palpitations or shortness of breath. The current treatment provides moderate improvement. Compliance problems include exercise.  Hypertensive end-organ damage includes CAD/MI.    Past Medical History:  Diagnosis Date   Anxiety    Blood dyscrasia    Chronic kidney disease    Gout    Hyperlipemia    Hypertension    LVH (left ventricular hypertrophy)    12/21/09 Echo -mild asymmetric LVH,EF =>55%   Morbid obesity (HCC)    Myocardial infarction (HCC)    OSA (obstructive sleep apnea)    PAD (peripheral artery disease) (HCC)    Pneumonia    Pre-diabetes    Renal artery stenosis ([place]) 12/13/2010   PTCA and Stent - right   Seizures (HCC)    Syncopal episodes    Thrombocytosis      Family History  Problem Relation Age of Onset   Heart failure Mother    Diabetes Father    Diabetes Sister    Hypertension Sister    Hypertension Sister    Hypertension Sister    Breast cancer Maternal Aunt        2 maternal aunts had breast cancer.   Breast cancer Maternal Grandmother      Current Outpatient Medications:    anagrelide ([name]) 1 MG capsule, TAKE 1 CAPSULE(1 MG) BY MOUTH DAILY (Patient taking differently: Take 1 mg by mouth daily.), Disp: 30 capsule, Rfl: 5   aspirin EC 81 MG tablet, Take 81 mg by mouth daily. Swallow whole., Disp: , Rfl:    atorvastatin (LIPITOR) 40 MG  tablet, TAKE 1 TABLET BY MOUTH DAILY AT 6 PM, Disp: 90 tablet, Rfl: 2   Cholecalciferol (VITAMIN D3) 2000 UNITS capsule, Take 2,000 Units by mouth daily. , Disp: , Rfl:    ibuprofen (ADVIL) 800 MG tablet, Take 1 tablet (800 mg total) by mouth every 8 (eight) hours as needed., Disp: 30 tablet, Rfl: 0   Lacosamide ([name]) 150 MG TABS, Take 1 tablet (150 mg total) by mouth 2 (two) times daily., Disp: 60 tablet, Rfl: 11   Magnesium 250 MG TABS, Take 250 mg by mouth in the morning and at  bedtime., Disp: , Rfl:    metoprolol tartrate (LOPRESSOR) 50 MG tablet, Take 1.5 tablets (75 mg total) by mouth 2 (two) times daily., Disp: 90 tablet, Rfl: 3   Multiple Vitamin (MULTIVITAMIN) capsule, Take 1 capsule by mouth daily., Disp: , Rfl:    oxyCODONE (OXY IR/ROXICODONE) 5 MG immediate release tablet, Take 1 tablet (5 mg total) by mouth every 6 (six) hours as needed for severe pain., Disp: 20 tablet, Rfl: 0   telmisartan (MICARDIS) 80 MG tablet, TAKE 1 TABLET BY MOUTH EVERY DAY, Disp: 90 tablet, Rfl: 1   Zoster Vaccine Adjuvanted (SHINGRIX) injection, Inject 0.5 mLs into the muscle once for 1 dose., Disp: 0.5 mL, Rfl: 0   amLODipine (NORVASC) 2.5 MG tablet, TAKE 1 TABLET(2.5 MG) BY MOUTH DAILY, Disp: 90 tablet, Rfl: 1   Allergies  Allergen Reactions   Sulfa Antibiotics Itching     Review of Systems  Constitutional: Negative.   Eyes:  Negative for blurred vision.  Respiratory: Negative.  Negative for shortness of breath.   Cardiovascular: Negative.  Negative for chest pain and palpitations.  Neurological: Negative.   Psychiatric/Behavioral: Negative.      Today's Vitals   07/19/21 1106  BP: 138/80  Pulse: 76  Temp: 98.4 F (36.9 C)  Weight: 161 lb (73 kg)  Height: 5' 2.2" (1.58 m)  PainSc: 0-No pain   Body mass index is 29.26 kg/m.   Wt Readings from Last 3 Encounters:  07/19/21 161 lb (73 kg)  06/24/21 163 lb 6 oz (74.1 kg)  06/23/21 162 lb (73.5 kg)    Objective:  Physical Exam Vitals and nursing note reviewed.  Constitutional:      Appearance: Normal appearance.  HENT:     Head: Normocephalic and atraumatic.     Nose:     Comments: Masked     Mouth/Throat:     Comments: Masked  Eyes:     Extraocular Movements: Extraocular movements intact.  Cardiovascular:     Rate and Rhythm: Normal rate and regular rhythm.     Heart sounds: Normal heart sounds.  Pulmonary:     Effort: Pulmonary effort is normal.     Breath sounds: Normal breath sounds.   Musculoskeletal:     Cervical back: Normal range of motion.  Skin:    General: Skin is warm.  Neurological:     General: No focal deficit present.     Mental Status: She is alert.  Psychiatric:        Mood and Affect: Mood normal.        Behavior: Behavior normal.        Assessment And Plan:     1. Type 2 diabetes mellitus with stage 3a chronic kidney disease, without long-term current use of insulin ([facility]) Comments: Chronic, a1c drawn in August 2022, no need for repeat labs today. She is encouraged to follow dietary guidelines and limit intake of  sweetened beverages. States scheduled for eye exam next month.   2. Hypertensive heart and renal disease with renal failure, stage 1 through stage 4 or unspecified chronic kidney disease, without heart failure Comments: Chronic, uncontrolled. Goal BP is less than 130/80. She is encouraged to follow low sodium diet.   3. Chronic diastolic congestive heart failure (HCC) Comments: Chronic.Again, importance of dietary, medication compliance was stressed to the patient.   4. CAD s/p CABG Comments: Chronic, yet stable. She was congratulated on the lifestyle chnages she has made thus far.   5. Other seizures ([place]) Comments: As per Neurology.   6. Overweight with body mass index (BMI) 25.0-29.9 Comments: BMI 29. She was congratulated on her weight loss thus far, she has lost a total of greater than 50 pounds over the past year or so.   7. Immunization due Comments: She was given high dose flu vaccine.  I will send rx Shingrix to her local pharmacy.  - Flu Vaccine QUAD High Dose(Fluad) - Zoster Vaccine Adjuvanted [facility]) injection; Inject 0.5 mLs into the muscle once for 1 dose.  Dispense: 0.5 mL; Refill: 0   Patient was given opportunity to ask questions. Patient verbalized understanding of the plan and was able to repeat key elements of the plan. All questions were answered to their satisfaction.   I, [name], MD, have reviewed  all documentation for this visit. The documentation on 07/19/21 for the exam, diagnosis, procedures, and orders are all accurate and complete.   IF YOU HAVE BEEN REFERRED TO A SPECIALIST, IT MAY TAKE 1-2 WEEKS TO SCHEDULE/PROCESS THE REFERRAL. IF YOU HAVE NOT HEARD FROM US/SPECIALIST IN TWO WEEKS, PLEASE GIVE [place] A CALL AT [phone].   THE PATIENT IS ENCOURAGED TO PRACTICE SOCIAL DISTANCING DUE TO THE COVID-19 PANDEMIC.

## 2021-07-19 NOTE — Patient Instructions (Signed)
Diabetes Mellitus and Nutrition, Adult When you have diabetes, or diabetes mellitus, it is very important to have healthy eating habits because your blood sugar (glucose) levels are greatly affected by what you eat and drink. Eating healthy foods in the right amounts, at about the same times every day, can help you:  Control your blood glucose.  Lower your risk of heart disease.  Improve your blood pressure.  Reach or maintain a healthy weight. What can affect my meal plan? Every person with diabetes is different, and each person has different needs for a meal plan. Your health care provider may recommend that you work with a dietitian to make a meal plan that is best for you. Your meal plan may vary depending on factors such as:  The calories you need.  The medicines you take.  Your weight.  Your blood glucose, blood pressure, and cholesterol levels.  Your activity level.  Other health conditions you have, such as heart or kidney disease. How do carbohydrates affect me? Carbohydrates, also called carbs, affect your blood glucose level more than any other type of food. Eating carbs naturally raises the amount of glucose in your blood. Carb counting is a method for keeping track of how many carbs you eat. Counting carbs is important to keep your blood glucose at a healthy level, especially if you use insulin or take certain oral diabetes medicines. It is important to know how many carbs you can safely have in each meal. This is different for every person. Your dietitian can help you calculate how many carbs you should have at each meal and for each snack. How does alcohol affect me? Alcohol can cause a sudden decrease in blood glucose (hypoglycemia), especially if you use insulin or take certain oral diabetes medicines. Hypoglycemia can be a life-threatening condition. Symptoms of hypoglycemia, such as sleepiness, dizziness, and confusion, are similar to symptoms of having too much  alcohol.  Do not drink alcohol if: ? Your health care provider tells you not to drink. ? You are pregnant, may be pregnant, or are planning to become pregnant.  If you drink alcohol: ? Do not drink on an empty stomach. ? Limit how much you use to:  0-1 drink a day for women.  0-2 drinks a day for men. ? Be aware of how much alcohol is in your drink. In the [place] one drink equals one 12 oz bottle of beer (355 mL), one 5 oz glass of wine (148 mL), or one 1 oz glass of hard liquor (44 mL). ? Keep yourself hydrated with water, diet soda, or unsweetened iced tea.  Keep in mind that regular soda, juice, and other mixers may contain a lot of sugar and must be counted as carbs. What are tips for following this plan? Reading food labels  Start by checking the serving size on the "Nutrition Facts" label of packaged foods and drinks. The amount of calories, carbs, fats, and other nutrients listed on the label is based on one serving of the item. Many items contain more than one serving per package.  Check the total grams (g) of carbs in one serving. You can calculate the number of servings of carbs in one serving by dividing the total carbs by 15. For example, if a food has 30 g of total carbs per serving, it would be equal to 2 servings of carbs.  Check the number of grams (g) of saturated fats and trans fats in one serving. Choose foods that have   a low amount or none of these fats.  Check the number of milligrams (mg) of salt (sodium) in one serving. Most people should limit total sodium intake to less than 2,300 mg per day.  Always check the nutrition information of foods labeled as "low-fat" or "nonfat." These foods may be higher in added sugar or refined carbs and should be avoided.  Talk to your dietitian to identify your daily goals for nutrients listed on the label. Shopping  Avoid buying canned, pre-made, or processed foods. These foods tend to be high in fat, sodium, and added  sugar.  Shop around the outside edge of the grocery store. This is where you will most often find fresh fruits and vegetables, bulk grains, fresh meats, and fresh dairy. Cooking  Use low-heat cooking methods, such as baking, instead of high-heat cooking methods like deep frying.  Cook using healthy oils, such as olive, canola, or sunflower oil.  Avoid cooking with butter, cream, or high-fat meats. Meal planning  Eat meals and snacks regularly, preferably at the same times every day. Avoid going long periods of time without eating.  Eat foods that are high in fiber, such as fresh fruits, vegetables, beans, and whole grains. Talk with your dietitian about how many servings of carbs you can eat at each meal.  Eat 4-6 oz (112-168 g) of lean protein each day, such as lean meat, chicken, fish, eggs, or tofu. One ounce (oz) of lean protein is equal to: ? 1 oz (28 g) of meat, chicken, or fish. ? 1 egg. ?  cup (62 g) of tofu.  Eat some foods each day that contain healthy fats, such as avocado, nuts, seeds, and fish.   What foods should I eat? Fruits Berries. Apples. Oranges. Peaches. Apricots. Plums. Grapes. Mango. Papaya. Pomegranate. Kiwi. Cherries. Vegetables Lettuce. Spinach. Leafy greens, including kale, chard, collard greens, and mustard greens. Beets. Cauliflower. Cabbage. Broccoli. Carrots. Green beans. Tomatoes. Peppers. Onions. Cucumbers. Brussels sprouts. Grains Whole grains, such as whole-wheat or whole-grain bread, crackers, tortillas, cereal, and pasta. Unsweetened oatmeal. Quinoa. Brown or wild rice. Meats and other proteins Seafood. Poultry without skin. Lean cuts of poultry and beef. Tofu. Nuts. Seeds. Dairy Low-fat or fat-free dairy products such as milk, yogurt, and cheese. The items listed above may not be a complete list of foods and beverages you can eat. Contact a dietitian for more information. What foods should I avoid? Fruits Fruits canned with  syrup. Vegetables Canned vegetables. Frozen vegetables with butter or cream sauce. Grains Refined white flour and flour products such as bread, pasta, snack foods, and cereals. Avoid all processed foods. Meats and other proteins Fatty cuts of meat. Poultry with skin. Breaded or fried meats. Processed meat. Avoid saturated fats. Dairy Full-fat yogurt, cheese, or milk. Beverages Sweetened drinks, such as soda or iced tea. The items listed above may not be a complete list of foods and beverages you should avoid. Contact a dietitian for more information. Questions to ask a health care provider  Do I need to meet with a diabetes educator?  Do I need to meet with a dietitian?  What number can I call if I have questions?  When are the best times to check my blood glucose? Where to find more information:  American Diabetes Association: diabetes.org  Academy of Nutrition and Dietetics: www.eatright.org  National Institute of Diabetes and Digestive and Kidney Diseases: www.niddk.nih.gov  Association of Diabetes Care and Education Specialists: www.diabeteseducator.org Summary  It is important to have healthy eating   habits because your blood sugar (glucose) levels are greatly affected by what you eat and drink.  A healthy meal plan will help you control your blood glucose and maintain a healthy lifestyle.  Your health care provider may recommend that you work with a dietitian to make a meal plan that is best for you.  Keep in mind that carbohydrates (carbs) and alcohol have immediate effects on your blood glucose levels. It is important to count carbs and to use alcohol carefully. This information is not intended to replace advice given to you by your health care provider. Make sure you discuss any questions you have with your health care provider. Document Revised: 09/17/2019 Document Reviewed: 09/17/2019 Elsevier Patient Education  2021 Elsevier Inc.  

## 2021-07-20 ENCOUNTER — Encounter: Payer: Self-pay | Admitting: Internal Medicine

## 2021-07-22 ENCOUNTER — Ambulatory Visit: Payer: BC Managed Care – PPO | Admitting: Adult Health

## 2021-07-23 ENCOUNTER — Encounter: Payer: Self-pay | Admitting: Neurology

## 2021-07-26 ENCOUNTER — Telehealth: Payer: Self-pay

## 2021-07-26 NOTE — Chronic Care Management (AMB) (Signed)
07/26/2021- Called patient to reschedule visit on 07/27/2021 due to Pharm D being out of the office on vacation, spoke with patient, appointment rescheduled to 09/02/2021 at 3:45 PM.  Pattricia Boss, [place] Pharmacist Assistant [phone]

## 2021-07-27 ENCOUNTER — Telehealth: Payer: Self-pay

## 2021-07-30 ENCOUNTER — Encounter: Payer: Self-pay | Admitting: Internal Medicine

## 2021-08-03 ENCOUNTER — Telehealth: Payer: Self-pay

## 2021-08-03 NOTE — Chronic Care Management (AMB) (Signed)
Chronic Care Management Pharmacy Assistant   Name: Sara [name]  MRN: [id number] DOB: December 05, 1955  Reason for Encounter: Disease State/ Diabetes  Recent office visits:  06-23-2021 Bary Castilla, NP. Hospital follow up.  07-19-2021 [name], MD. Flu vaccine and Shingrix given  Recent consult visits:  05-12-2021 Sanda Klein, MD (Cardiology). No changes follow up in 1 year.  06-11-2021 Kinsinger, Arta Bruce, MD. Pre admissions testing.  06-15-2021 Dahlia Client, RPSGT (Neurology). EEG procedure.  06-17-2021 Kinsinger, Arta Bruce, MD. Open ventral hernia repair with mesh procedure. START ibuprofen 800 mg every 8 hours as needed and Oxycodone 5 mg every 6 hours as needed. Discharged 06-18-2021.  06-24-2021 Star Age, MD (Neurology). Night time sleep study to be ordered.  07-01-2021 Kinsinger, Arta Bruce, MD. Post op follow up.  Hospital visits:  Medication Reconciliation was completed by comparing discharge summary, patient's EMR and Pharmacy list, and upon discussion with patient.  Admitted to the hospital on 06-19-2021 due to seizure. Discharge date was 06-19-2021. Discharged from [facility]?Medications Started at [facility] Discharge:?? None  Medication Changes at Hospital Discharge: None  Medications Discontinued at Hospital Discharge: None  Medications that remain the same after Hospital Discharge:??  -All other medications will remain the same.    Medications: Outpatient Encounter Medications as of 08/03/2021  Medication Sig   amLODipine (NORVASC) 2.5 MG tablet TAKE 1 TABLET(2.5 MG) BY MOUTH DAILY   anagrelide ([name]) 1 MG capsule TAKE 1 CAPSULE(1 MG) BY MOUTH DAILY (Patient taking differently: Take 1 mg by mouth daily.)   aspirin EC 81 MG tablet Take 81 mg by mouth daily. Swallow whole.   atorvastatin (LIPITOR) 40 MG tablet TAKE 1 TABLET BY MOUTH DAILY AT 6 PM   Cholecalciferol (VITAMIN D3) 2000 UNITS capsule Take  2,000 Units by mouth daily.    ibuprofen (ADVIL) 800 MG tablet Take 1 tablet (800 mg total) by mouth every 8 (eight) hours as needed.   Lacosamide ([name]) 150 MG TABS Take 1 tablet (150 mg total) by mouth 2 (two) times daily.   Magnesium 250 MG TABS Take 250 mg by mouth in the morning and at bedtime.   metoprolol tartrate (LOPRESSOR) 50 MG tablet Take 1.5 tablets (75 mg total) by mouth 2 (two) times daily.   Multiple Vitamin (MULTIVITAMIN) capsule Take 1 capsule by mouth daily.   oxyCODONE (OXY IR/ROXICODONE) 5 MG immediate release tablet Take 1 tablet (5 mg total) by mouth every 6 (six) hours as needed for severe pain.   telmisartan (MICARDIS) 80 MG tablet TAKE 1 TABLET BY MOUTH EVERY DAY   No facility-administered encounter medications on file as of 08/03/2021.   Recent Relevant Labs: Lab Results  Component Value Date/Time   HGBA1C 6.1 (H) 06/17/2021 01:12 PM   HGBA1C 6.0 (H) 06/11/2021 11:41 AM   MICROALBUR 10 11/04/2020 05:22 PM   MICROALBUR 80 03/13/2019 12:10 PM    Kidney Function Lab Results  Component Value Date/Time   CREATININE 1.34 (H) 06/19/2021 04:00 AM   CREATININE 1.28 (H) 06/17/2021 01:12 PM   CREATININE 1.1 08/11/2017 03:28 PM   CREATININE 1.3 (H) 02/09/2017 01:52 PM   GFRNONAA 44 (L) 06/19/2021 04:00 AM   GFRAA 49 (L) 11/16/2020 12:09 PM    Current antihyperglycemic regimen:  None  What recent interventions/DTPs have been made to improve glycemic control:  None  Have there been any recent hospitalizations or ED visits since last visit with CPP? Yes  Patient denies hypoglycemic symptoms  Patient  denies hyperglycemic symptoms  How often are you checking your blood sugar? Patient states twice weekly.  What are your blood sugars ranging?  Fasting: 100 Before meals: None After meals: None Bedtime: None  During the week, how often does your blood glucose drop below 70? Never  Are you checking your feet daily/regularly? Patient states daily and Dr.  Baird Cancer checks her feet at her appointments.  Adherence Review: Is the patient currently on a STATIN medication? Yes Is the patient currently on ACE/ARB medication? Yes Does the patient have >5 day gap between last estimated fill dates? No  NOTES: Patient states she has loss 60 pounds. Patient states she feels the ozempic played a big role on her seizures because her blood sugar dropped so she dropped. Patient states she drinks lots of water and walks daily.  Care Gaps: A1C 06-17-2021 6.1 AWV 11-04-2021 Covid booster overdue last completed 08-10-2020 Ophthalmology exam overdue- scheduled for 08-09-2021  Star Rating Drugs: Telmisartan 80 mg- Last filled 06-11-2021 90 DS [facility] Atorvastatin 40 mg- Last filled 06-25-2021 90 DS [place] Clinical Pharmacist Assistant [phone]

## 2021-08-04 ENCOUNTER — Encounter: Payer: Self-pay | Admitting: Internal Medicine

## 2021-08-18 ENCOUNTER — Other Ambulatory Visit: Payer: Self-pay

## 2021-08-18 ENCOUNTER — Ambulatory Visit (INDEPENDENT_AMBULATORY_CARE_PROVIDER_SITE_OTHER): Payer: HMO | Admitting: Neurology

## 2021-08-18 DIAGNOSIS — R569 Unspecified convulsions: Secondary | ICD-10-CM

## 2021-08-18 DIAGNOSIS — Z789 Other specified health status: Secondary | ICD-10-CM

## 2021-08-18 DIAGNOSIS — R634 Abnormal weight loss: Secondary | ICD-10-CM

## 2021-08-18 DIAGNOSIS — Z951 Presence of aortocoronary bypass graft: Secondary | ICD-10-CM

## 2021-08-18 DIAGNOSIS — G4761 Periodic limb movement disorder: Secondary | ICD-10-CM

## 2021-08-18 DIAGNOSIS — G4720 Circadian rhythm sleep disorder, unspecified type: Secondary | ICD-10-CM

## 2021-08-18 DIAGNOSIS — G4733 Obstructive sleep apnea (adult) (pediatric): Secondary | ICD-10-CM

## 2021-08-23 NOTE — Procedures (Signed)
PATIENT'S NAME:  Sara [name], Sara [name] DOB:      1956-01-08      MR#:    [id number]     DATE OF RECORDING: 08/18/2021 REFERRING M.D.:  [name], MD Study Performed:   Baseline Polysomnogram HISTORY: 65 year old right-handed woman with an underlying medical history of thrombocytosis, hypertension, type 2 diabetes, gout, renal artery stenosis, s/p stent placement in 2013, nocturnal seizures, peripheral artery disease, hyperlipidemia, obesity, left ventricular hypertrophy, heart attack in 2018 with status post three-vessel CABG in June 2018, and prior diagnosis of severe obstructive sleep apnea who presents for re-evaluation of her sleep apnea.  She is no longer on CPAP therapy and has achieved significant weight loss.  The patient's weight 163 pounds with a height of 61 (inches), resulting in a BMI of 30.8 kg/m2.   CURRENT MEDICATIONS: Norvasc, [name], Aspirin, Lipitor, Vitamin D3, Advil, [name], Magnesium, Lopressor, Multivitamin, Oxycodone, Micardis   PROCEDURE:  This is a multichannel digital polysomnogram utilizing the Somnostar 11.2 system.  Electrodes and sensors were applied and monitored per AASM Specifications.   EEG, EOG, Chin and Limb EMG, were sampled at 200 Hz.  ECG, Snore and Nasal Pressure, Thermal Airflow, Respiratory Effort, CPAP Flow and Pressure, Oximetry was sampled at 50 Hz. Digital video and audio were recorded.      BASELINE STUDY  Lights Out was at 21:45 and Lights On at 04:51.  Total recording time (TRT) was 427 minutes, with a total sleep time (TST) of 243.5 minutes.   The patient's sleep latency was 49 minutes, which is delayed. REM latency was 154.5 minutes, which is delayed. The sleep efficiency was 57%, which is reduced.     SLEEP ARCHITECTURE: WASO (Wake after sleep onset) was 135.5 minutes with mild to moderate sleep fragmentation noted and several longer period of wakefulness. There were 18 minutes in Stage N1, 185 minutes Stage N2, 14 minutes Stage N3 and 26.5 minutes  in Stage REM.  The percentage of Stage N1 was 7.4%, Stage N2 was 76.%, which is increased, Stage N3 was 5.7% and Stage R (REM sleep) was 10.9%, which is reduced. The arousals were noted as: 34 were spontaneous, 5 were associated with PLMs, 6 were associated with respiratory events.  RESPIRATORY ANALYSIS:  There were a total of 24 respiratory events:  6 obstructive apneas, 0 central apneas and 7 mixed apneas with a total of 13 apneas and an apnea index (AI) of 3.2 /hour. There were 11 hypopneas with a hypopnea index of 2.7 /hour. The patient also had 0 respiratory event related arousals (RERAs).      The total APNEA/HYPOPNEA INDEX (AHI) was 5.9/hour and the total RESPIRATORY DISTURBANCE INDEX was  5.9 /hour.  8 events occurred in REM sleep and 16 events in NREM. The REM AHI was  18.1 /hour, versus a non-REM AHI of 4.4. The patient spent 33.5 minutes of total sleep time in the supine position and 210 minutes in non-supine.. The supine AHI was 7.2 versus a non-supine AHI of 5.7.  OXYGEN SATURATION & C02:  The Wake baseline 02 saturation was 98%, with the lowest being 84%. Time spent below 89% saturation equaled 9 minutes.    PERIODIC LIMB MOVEMENTS: The patient had a total of 77 Periodic Limb Movements.  The Periodic Limb Movement (PLM) index was 19. and the PLM Arousal index was 1.2/hour.  Audio and video analysis did not show any abnormal or unusual movements, behaviors, phonations or vocalizations. The patient took 2 bathroom breaks. Mild snoring was noted. The EKG  was in keeping with normal sinus rhythm (NSR).  Post-study, the patient indicated that sleep was the same as usual.   IMPRESSION:  Mild obstructive Sleep Apnea (OSA) PLMD (periodic limb movements of sleep) Dysfunctions associated with sleep stages or arousal from sleep  RECOMMENDATIONS:  This sleep study demonstrates overall mild residual obstructive sleep apnea with a total AHI of 5.9/hour and O2 nadir of 84%. Mild snoring was  noted. Treatment with positive airway pressure is not warranted but can be considered in the form of autoPAP. Alternative treatments include weight loss along with avoidance of the supine sleep position, or an oral appliance in appropriate candidates.   Please note that untreated obstructive sleep apnea may carry additional perioperative morbidity. Patients with significant obstructive sleep apnea should receive perioperative PAP therapy and the surgeons and particularly the anesthesiologist should be informed of the diagnosis and the severity of the sleep disordered breathing. This study shows sleep fragmentation and abnormal sleep stage percentages; these are nonspecific findings and per se do not signify an intrinsic sleep disorder or a cause for the patient's sleep-related symptoms. Causes include (but are not limited to) the first night effect of the sleep study, circadian rhythm disturbances, medication effect or an underlying mood disorder or medical problem.  The patient should be cautioned not to drive, work at heights, or operate dangerous or heavy equipment when tired or sleepy. Review and reiteration of good sleep hygiene measures should be pursued with any patient. Mild PLMs (periodic limb movements of sleep) were noted during this study with no significant arousals; clinical correlation is recommended.  The patient will be notified of the test results. The patient will be seen in follow up in sleep clinic at [facility] as necessary.  I certify that I have reviewed the entire raw data recording prior to the issuance of this report in accordance with the Standards of Accreditation of the American Academy of Sleep Medicine (AASM)  Star Age, MD, PhD Diplomat, American Board of Neurology and Sleep Medicine (Neurology and Sleep Medicine)

## 2021-08-25 ENCOUNTER — Telehealth: Payer: Self-pay | Admitting: Neurology

## 2021-08-25 ENCOUNTER — Encounter: Payer: Self-pay | Admitting: Neurology

## 2021-08-25 DIAGNOSIS — G4733 Obstructive sleep apnea (adult) (pediatric): Secondary | ICD-10-CM

## 2021-08-25 NOTE — Telephone Encounter (Signed)
Called the patient. Someone answered and states the patient was not there. Advised to tell the patient to call our office back and that a mychart message will also be sent to her.

## 2021-08-25 NOTE — Telephone Encounter (Signed)
-----   Message from Star Age, MD sent at 08/23/2021  5:14 PM EDT ----- Patient was seen on 06/24/21 for re-eval of her OSA. She was no longer on CPAP d/t wt loss. She had a PSG on 08/18/21:    Please call and notify the patient that the recent sleep study showed evidence of mild sleep apnea. Using a CPAP is not imperative, but we can consider treatment with a machine if she wants to get back on treatment. I would recommend an autoPAP machine and she may qualify for new equipment. Alternative treatment may be in the form of further weight loss and avoiding the back sleep position or a dental device through a dentist. If she would like to start treatment with a new machine, I can send an order for autoPAP to her DME. Let me know.   Star Age, MD, PhD [facility])

## 2021-08-26 ENCOUNTER — Encounter: Payer: Self-pay | Admitting: Internal Medicine

## 2021-08-30 NOTE — Telephone Encounter (Signed)
I would be happy to write for an AutoPap machine if the patient decides to go back on treatment with AutoPap.  She may be eligible for new equipment if she decides to go on treatment with an AutoPap machine.  As far as her questions about seizures, she can make a sooner appointment with the nurse practitioner, she is currently scheduled with Jinny Blossom in January.  I am not sure if Dr. Jannifer Franklin had a plan for her to transition to another physician in this office.  In my opinion, our seizure specialist, Dr. April Manson may be a good fit.  She can discuss further with the nurse practitioner when she sees [place] or another nurse practitioner for a sooner appointment or her scheduled appointment.  Please assist with follow-up appointment if she would like a sooner appointment.  Let me know if she would like to start AutoPap therapy.  Treatment for mild sleep apnea is optional at this time.  If she would rather have a referral to the dentist for consideration of an oral appliance, I would be happy to make a referral.

## 2021-09-01 ENCOUNTER — Ambulatory Visit: Payer: Medicare Other | Admitting: Hematology

## 2021-09-01 ENCOUNTER — Telehealth: Payer: Self-pay

## 2021-09-01 ENCOUNTER — Other Ambulatory Visit: Payer: Medicare Other

## 2021-09-01 NOTE — Telephone Encounter (Signed)
I called pt she had been trying to get in touch with [place] and had not been able to look on her mychart.  She said that she has not had anymore episodes.  I relayed message from Dr. Rexene Alberts  to her ::states it is honestly hard to know for certain if that is the actual cause of the seizures happening at night. She definitely recommends discussing further seizure concerns at the upcoming apt with the nurse practitioner. I do think that it is worth trying the dental device since you have done CPAP and the apnea is on the milder side that way you are still treating the apnea and you can see if you notice decrease in seizures that occur at night. I can place the referral for you to the dentist that make this dental device for mild sleep apnea.   Pt verbalized understanding.  Had not had anymore episodes.  She wanted to wait until seen before deciding on dentist referral. I offered 09-02-21 at 0900, but she had other appt.  I placed on waitilist and on my cancellation list if one became available sooner.  She appreciated this.

## 2021-09-01 NOTE — Chronic Care Management (AMB) (Signed)
   OR: [name] husband was reminded to have all medications, supplements and any blood glucose and blood pressure readings available for review with [name], Pharm. D, at her telephone visit on at 3:45-4:00.     Care Gaps: PNA Vac overdue Shingrix overdue Covid booster overdue Yearly ophthalmology overdue AWV 11-04-2021  Star Rating Drug: Telmisartan 80 mg- Last filled 06-11-2021 90 DS [facility] Atorvastatin 40 mg- Last filled 06-25-2021 90 DS [facility]  Any gaps in medications fill history? No  [place] Pharmacist Assistant [phone]

## 2021-09-02 ENCOUNTER — Encounter: Payer: Self-pay | Admitting: Hematology

## 2021-09-02 ENCOUNTER — Inpatient Hospital Stay: Payer: HMO | Attending: Nurse Practitioner

## 2021-09-02 ENCOUNTER — Telehealth: Payer: HMO

## 2021-09-02 ENCOUNTER — Inpatient Hospital Stay (HOSPITAL_BASED_OUTPATIENT_CLINIC_OR_DEPARTMENT_OTHER): Payer: HMO | Admitting: Hematology

## 2021-09-02 ENCOUNTER — Other Ambulatory Visit: Payer: Self-pay

## 2021-09-02 VITALS — BP 176/71 | HR 65 | Temp 98.3°F | Resp 18 | Ht 62.2 in | Wt 162.9 lb

## 2021-09-02 DIAGNOSIS — Z79899 Other long term (current) drug therapy: Secondary | ICD-10-CM | POA: Insufficient documentation

## 2021-09-02 DIAGNOSIS — E119 Type 2 diabetes mellitus without complications: Secondary | ICD-10-CM | POA: Insufficient documentation

## 2021-09-02 DIAGNOSIS — Z951 Presence of aortocoronary bypass graft: Secondary | ICD-10-CM | POA: Insufficient documentation

## 2021-09-02 DIAGNOSIS — D473 Essential (hemorrhagic) thrombocythemia: Secondary | ICD-10-CM | POA: Diagnosis not present

## 2021-09-02 DIAGNOSIS — Z0000 Encounter for general adult medical examination without abnormal findings: Secondary | ICD-10-CM

## 2021-09-02 DIAGNOSIS — I2510 Atherosclerotic heart disease of native coronary artery without angina pectoris: Secondary | ICD-10-CM | POA: Diagnosis not present

## 2021-09-02 DIAGNOSIS — Z7982 Long term (current) use of aspirin: Secondary | ICD-10-CM | POA: Insufficient documentation

## 2021-09-02 DIAGNOSIS — M1712 Unilateral primary osteoarthritis, left knee: Secondary | ICD-10-CM | POA: Diagnosis not present

## 2021-09-02 DIAGNOSIS — I252 Old myocardial infarction: Secondary | ICD-10-CM | POA: Diagnosis not present

## 2021-09-02 LAB — CBC WITH DIFFERENTIAL/PLATELET
Abs Immature Granulocytes: 0.01 10*3/uL (ref 0.00–0.07)
Basophils Absolute: 0.1 10*3/uL (ref 0.0–0.1)
Basophils Relative: 1 %
Eosinophils Absolute: 0.2 10*3/uL (ref 0.0–0.5)
Eosinophils Relative: 3 %
HCT: 44.1 % (ref 36.0–46.0)
Hemoglobin: 14.2 g/dL (ref 12.0–15.0)
Immature Granulocytes: 0 %
Lymphocytes Relative: 20 %
Lymphs Abs: 1.3 10*3/uL (ref 0.7–4.0)
MCH: 26.9 pg (ref 26.0–34.0)
MCHC: 32.2 g/dL (ref 30.0–36.0)
MCV: 83.5 fL (ref 80.0–100.0)
Monocytes Absolute: 0.3 10*3/uL (ref 0.1–1.0)
Monocytes Relative: 5 %
Neutro Abs: 4.4 10*3/uL (ref 1.7–7.7)
Neutrophils Relative %: 71 %
Platelets: 515 10*3/uL — ABNORMAL HIGH (ref 150–400)
RBC: 5.28 MIL/uL — ABNORMAL HIGH (ref 3.87–5.11)
RDW: 15.9 % — ABNORMAL HIGH (ref 11.5–15.5)
WBC: 6.2 10*3/uL (ref 4.0–10.5)
nRBC: 0 % (ref 0.0–0.2)

## 2021-09-02 MED ORDER — ANAGRELIDE HCL 1 MG PO CAPS: 1.0000 mg | ORAL_CAPSULE | Freq: Every day | ORAL | 5 refills | Status: DC

## 2021-09-02 NOTE — Addendum Note (Signed)
Addended by: Star Age on: 09/02/2021 09:50 AM   Modules accepted: Orders

## 2021-09-02 NOTE — Telephone Encounter (Signed)
Referral placed to dentistry.

## 2021-09-02 NOTE — Progress Notes (Signed)
[place]   [phone] [phone]) [phone]   Clinic Follow up Note   Patient Care Team: [name], MD as PCP - General (Internal Medicine) Croitoru, Dani Gobble, MD as PCP - Cardiology (Cardiology) Mayford Knife, [facility] (Pharmacist) Lynne Logan, RN as [place] Management  Date of Service:  09/02/2021  CHIEF COMPLAINT: f/u of essential thrombocytosis  CURRENT THERAPY:  -Hydroxyurea 500 mg daily, started in 2010., increased to hydrea 1000mg  daily on Monday and Thursday, and 500mg  daily for the rest of week in 01/2015 -Aspirin 81 mg daily.  -Switched Hydrea to anagrelide 1 mg daily 07/2016. Held and was on hydrea from 01/2019-02/2019 due to Anagrelide on back order. Increase Anagrelide to 1mg  daily in AM and on MWF add 0.5mg  in the PM starting 01/03/20. Reduced to Anagrelide 1mg  daily from 01/28/21 (after hospitalization)  ASSESSMENT & PLAN:  Sara [name] is a 65 y.o. female with   1. Essential Thrombocythemia. JAK2 (+)  -Diagnosed in 2010. JAK2 mutation was present. Her bone marrow aspirate and biopsy on 01/20/2009 which yielded a specimen that was suboptimal for evaluation. However, megakaryocytes were abundant with normal morphology. There was no clustering or excess blasts seen. Platelet count on 01/20/2009 was 881.  -She was on Hydrea previously, had episodes of dizziness and syncope, and she was concerned about the risk of leukemia, so it was stopped. The patient is being treated with aspirin and Anagrelide 1mg  in AM and on MWF add 0.5mg  in the PM.  -She was off Anagrelide for 7 days in April 2022 due to significant bowl obstruction. She restarted Anagrelide at 1mg  daily only since discharge from 01/28/21. She is tolerating well.  -Labs reviewed, plt 515k today (09/02/21). I discussed increasing her anagrelide dose slightly, however patient is very reluctant to make the change.  Given slightly increased platelet count, I am okay to  continue Anagrelide 1mg  daily for now.  -will monitor with lab every 2 months and F/u in 4 months    2. H/o of Seizure, Recurrent Syncope episodes -Her Syncope have unclear etiology. -She has a heart monitor.  -continue meds, follow up with her neurologist.    3. Diabetes, HTN, Arthritis, CAD -She has been seeing a nutritionist at church. -She monitors her sugar level regularly -She has recent arthritis in her left knee.  -Coronary artery disease status post bypass surgery grafting x 2 by Dr. Cyndia Bent in June 2018. -Continue to f/u with cardiologist and PCP      PLAN: -Continue anagrelide 1 mg daily, will increase anagrelide dose slightly if platelet count more than 550K in future  -lab every 2 months -F/u in 4 months    No problem-specific Assessment & Plan notes found for this encounter.   INTERVAL HISTORY:  Sara [name] is here for a follow up of essential thrombocytosis. She was last seen by me on 03/03/21. She presents to the clinic accompanied by her husband. She notes she had hernia repair on 06/17/21. She reports she was started on ibuprofen after the surgery, had not eaten for a while, then had a seizure 06/19/21.  She notes she has lost about 40 lbs in two years (02/2019-02/2021). Her BP is high today. She notes she had a situation with her son last night that affected her. She also notes she missed her medication this morning.   All other systems were reviewed with the patient and are negative.  MEDICAL HISTORY:  Past Medical History:  Diagnosis Date  Anxiety    Blood dyscrasia    Chronic kidney disease    Gout    Hyperlipemia    Hypertension    LVH (left ventricular hypertrophy)    12/21/09 Echo -mild asymmetric LVH,EF =>55%   Morbid obesity (HCC)    Myocardial infarction (HCC)    OSA (obstructive sleep apnea)    PAD (peripheral artery disease) (HCC)    Pneumonia    Pre-diabetes    Renal artery stenosis ([place]) 12/13/2010   PTCA and Stent - right   Seizures  (HCC)    Syncopal episodes    Thrombocytosis     SURGICAL HISTORY: Past Surgical History:  Procedure Laterality Date   BREAST CYST EXCISION     S/P Benign Right Breast Cyst Removal.   BREAST REDUCTION SURGERY  1995   S/P Bilateral breast reduction   [place], 1986   Times  Two.   CORONARY ARTERY BYPASS GRAFT N/A 04/21/2017   Procedure: CORONARY ARTERY BYPASS GRAFTING (CABG) x 2, USING LEFT MAMMARY ARTERY AND RIGHT GREATER SAPHENOUS VEIN HARVESTED ENDOSCOPICALLY;  Surgeon: [name], MD;  Location: [place];  Service: Open Heart Surgery;  Laterality: N/A;   EXTREMITY CYST EXCISION  1993   left wrist   LEFT HEART CATH AND CORONARY ANGIOGRAPHY N/A 04/17/2017   Procedure: Left Heart Cath and Coronary Angiography;  Surgeon: Jettie Booze, MD;  Location: [place] [facility];  Service: Cardiovascular;  Laterality: N/A;   PARTIAL HYSTERECTOMY  1994   renal artery stent placement Right    TEE WITHOUT CARDIOVERSION N/A 04/21/2017   Procedure: TRANSESOPHAGEAL ECHOCARDIOGRAM (TEE);  Surgeon: [name], MD;  Location: [place];  Service: Open Heart Surgery;  Laterality: N/A;   VENTRAL HERNIA REPAIR N/A 06/17/2021   Procedure: OPEN VENTRAL HERNIA REPAIR WITH MESH;  Surgeon: Kinsinger, Arta Bruce, MD;  Location: [facility];  Service: General;  Laterality: N/A;    I have reviewed the social history and family history with the patient and they are unchanged from previous note.  ALLERGIES:  is allergic to sulfa antibiotics.  MEDICATIONS:  Current Outpatient Medications  Medication Sig Dispense Refill   amLODipine (NORVASC) 2.5 MG tablet TAKE 1 TABLET(2.5 MG) BY MOUTH DAILY 90 tablet 1   anagrelide ([name]) 1 MG capsule Take 1 capsule (1 mg total) by mouth daily. 30 capsule 5   aspirin EC 81 MG tablet Take 81 mg by mouth daily. Swallow whole.     atorvastatin (LIPITOR) 40 MG tablet TAKE 1 TABLET BY MOUTH DAILY AT 6 PM 90 tablet 2   Cholecalciferol (VITAMIN D3) 2000 UNITS  capsule Take 2,000 Units by mouth daily.      ibuprofen (ADVIL) 800 MG tablet Take 1 tablet (800 mg total) by mouth every 8 (eight) hours as needed. 30 tablet 0   Lacosamide ([name]) 150 MG TABS Take 1 tablet (150 mg total) by mouth 2 (two) times daily. 60 tablet 11   Magnesium 250 MG TABS Take 250 mg by mouth in the morning and at bedtime.     metoprolol tartrate (LOPRESSOR) 50 MG tablet Take 1.5 tablets (75 mg total) by mouth 2 (two) times daily. 90 tablet 3   Multiple Vitamin (MULTIVITAMIN) capsule Take 1 capsule by mouth daily.     oxyCODONE (OXY IR/ROXICODONE) 5 MG immediate release tablet Take 1 tablet (5 mg total) by mouth every 6 (six) hours as needed for severe pain. 20 tablet 0   telmisartan (MICARDIS) 80 MG tablet TAKE 1  TABLET BY MOUTH EVERY DAY 90 tablet 1   No current facility-administered medications for this visit.    PHYSICAL EXAMINATION: ECOG PERFORMANCE STATUS: 1 - Symptomatic but completely ambulatory  Vitals:   09/02/21 0945  BP: (!) 176/71  Pulse: 65  Resp: 18  Temp: 98.3 F (36.8 C)  SpO2: 99%   Wt Readings from Last 3 Encounters:  09/02/21 162 lb 14.4 oz (73.9 kg)  07/19/21 161 lb (73 kg)  06/24/21 163 lb 6 oz (74.1 kg)     GENERAL:alert, no distress and comfortable SKIN: skin color normal, no rashes or significant lesions EYES: normal, Conjunctiva are pink and non-injected, sclera clear  NEURO: alert & oriented x 3 with fluent speech  LABORATORY DATA:  I have reviewed the data as listed CBC Latest Ref Rng & Units 09/02/2021 06/19/2021 06/17/2021  WBC 4.0 - 10.5 K/uL 6.2 7.9 10.2  Hemoglobin 12.0 - 15.0 g/dL 14.2 11.5(L) 13.8  Hematocrit 36.0 - 46.0 % 44.1 35.6(L) 43.8  Platelets 150 - 400 K/uL 515(H) 503(H) 476(H)     CMP Latest Ref Rng & Units 06/19/2021 06/17/2021 06/11/2021  Glucose 70 - 99 mg/dL 99 - 84  BUN 8 - 23 mg/dL 16 - 23  Creatinine 0.44 - 1.00 mg/dL 1.34(H) 1.28(H) 1.22(H)  Sodium 135 - 145 mmol/L 129(L) - 136  Potassium 3.5 - 5.1  mmol/L 4.2 - 4.3  Chloride 98 - 111 mmol/L 97(L) - 102  CO2 22 - 32 mmol/L 24 - 27  Calcium 8.9 - 10.3 mg/dL 9.4 - 10.3  Total Protein 6.5 - 8.1 g/dL 6.3(L) - -  Total Bilirubin 0.3 - 1.2 mg/dL 0.8 - -  Alkaline Phos 38 - 126 U/L 70 - -  AST 15 - 41 U/L 29 - -  ALT 0 - 44 U/L 22 - -      RADIOGRAPHIC STUDIES: I have personally reviewed the radiological images as listed and agreed with the findings in the report. No results found.    Orders Placed This Encounter  Procedures   Ferritin    Standing Status:   Future    Standing Expiration Date:   09/02/2022   Iron and TIBC    Standing Status:   Future    Standing Expiration Date:   09/02/2022   CMP ([place] only)    Standing Status:   Standing    Number of Occurrences:   2    Standing Expiration Date:   09/02/2022   All questions were answered. The patient knows to call the clinic with any problems, questions or concerns. No barriers to learning was detected. The total time spent in the appointment was 25 minutes.     Truitt Merle, MD 09/02/2021   I, Wilburn Mylar, am acting as scribe for Truitt Merle, MD.   I have reviewed the above documentation for accuracy and completeness, and I agree with the above.

## 2021-09-06 NOTE — Telephone Encounter (Signed)
Referral form for Dr. Ron Parker placed in nurse pod for MD signature.

## 2021-09-07 ENCOUNTER — Other Ambulatory Visit: Payer: Self-pay

## 2021-09-07 ENCOUNTER — Encounter: Payer: Self-pay | Admitting: Adult Health

## 2021-09-07 ENCOUNTER — Ambulatory Visit (INDEPENDENT_AMBULATORY_CARE_PROVIDER_SITE_OTHER): Payer: HMO | Admitting: Adult Health

## 2021-09-07 VITALS — BP 170/87 | HR 70 | Ht 61.0 in | Wt 166.2 lb

## 2021-09-07 DIAGNOSIS — R569 Unspecified convulsions: Secondary | ICD-10-CM

## 2021-09-07 DIAGNOSIS — G4733 Obstructive sleep apnea (adult) (pediatric): Secondary | ICD-10-CM | POA: Diagnosis not present

## 2021-09-07 NOTE — Progress Notes (Addendum)
PATIENT: Sara [name] DOB: 1956/02/03  REASON FOR VISIT: follow up HISTORY FROM: patient  HISTORY OF PRESENT ILLNESS: Today 09/07/21:  Sara [name] is a 65 year old female with a history of seizures and obstructive sleep apnea.  She returns today for follow-up.  Seizures: Reports that she has not had any additional events that she knows of since she started [name] 150 mg twice a day.  Tolerating the medication well.  Obstructive sleep apnea: Patient had repeat testing that showed mild sleep apnea.  Patient plans to continue to try to lose more weight but in the meantime she is interested in a dental device.  She returns today for follow-up  04/29/21: Sara [name] is a 65 year old female with a history of seizures and obstructive sleep apnea.  She returns today for follow-up.  She states that she had another "attack" in December or January.  The attacks always consist of her making sounds during her sleep.  She called her primary care provider.  Dr. Baird Cancer suggested that she take her sugar or have her husband take her sugar when she is making the sounds.  The patient states that typically when she wakes up she feels fine.  She continues to take [name] 100 mg in the morning and 150mg  at bedtime.  She denies missing any medication.  She returns today for an evaluation.  07/22/20:Sara [name] is a 64 year old female with a history of seizures and obstructive sleep apnea.  She returns today for follow-up.  Since her [name] was increased she reports that her husband has not reported any nocturnal events.  Overall she feels that she has been doing well.  Her CPAP download indicates that she use her machine nightly for compliance of 100%.  She used her machine greater than 4 hours each night.  On average she uses her machine 6 hours and 36 minutes.  Her residual AHI is 1.6 on 9 cm of water with EPR of 2.  Leak in the 95th percentile is 42 L/min.  She states that she does not feel the machine leaking  at night.  Overall she feels that she is doing well with the CPAP  HISTORY 03/02/20  Sara [name] is a 65 year old female with history of seizures and obstructive sleep apnea on CPAP.  Her seizures are typically characterized as staring off.  There was question of possible nocturnal seizure last year when she was taking Ozempic, question possible nocturnal seizure described as chewing around 2 AM.  [name] level  was normal, she remained on [name] 100 mg twice daily.  She is overall doing well, has reported 1 or 2 episodes her husband describes as, her making a loud noise, he couldn't wake her, when wearing CPAP, he went back to sleep. She has been trying to not eat before bed.  Recent A1c was 6.3.  She reports increase in stress.  She says when her husband notices these episodes, it it when she she is really tired, and not well rested.  She really does not want to go up on [name], she requires brand-name.  She presents today for evaluation unaccompanied.   Checked CPAP for equipment function, used for more than 4 hours, 29 out of 30 days, 97%, average usage (days used) 6 hours 44 minutes, set pressure 9 cmH2O, leak in the 95th percentile was 48.5, AHI 2.8.    REVIEW OF SYSTEMS: Out of a complete 14 system review of symptoms, the patient complains only of the following symptoms, and all other reviewed  systems are negative.   ALLERGIES: Allergies  Allergen Reactions   Sulfa Antibiotics Itching    HOME MEDICATIONS: Outpatient Medications Prior to Visit  Medication Sig Dispense Refill   amLODipine (NORVASC) 2.5 MG tablet TAKE 1 TABLET(2.5 MG) BY MOUTH DAILY 90 tablet 1   anagrelide ([name]) 1 MG capsule Take 1 capsule (1 mg total) by mouth daily. 30 capsule 5   aspirin EC 81 MG tablet Take 81 mg by mouth daily. Swallow whole.     atorvastatin (LIPITOR) 40 MG tablet TAKE 1 TABLET BY MOUTH DAILY AT 6 PM 90 tablet 2   Cholecalciferol (VITAMIN D3) 2000 UNITS capsule Take 2,000 Units by mouth daily.       ibuprofen (ADVIL) 800 MG tablet Take 1 tablet (800 mg total) by mouth every 8 (eight) hours as needed. 30 tablet 0   Lacosamide ([name]) 150 MG TABS Take 1 tablet (150 mg total) by mouth 2 (two) times daily. 60 tablet 11   Magnesium 250 MG TABS Take 250 mg by mouth in the morning and at bedtime.     metoprolol tartrate (LOPRESSOR) 50 MG tablet Take 1.5 tablets (75 mg total) by mouth 2 (two) times daily. 90 tablet 3   Multiple Vitamin (MULTIVITAMIN) capsule Take 1 capsule by mouth daily.     oxyCODONE (OXY IR/ROXICODONE) 5 MG immediate release tablet Take 1 tablet (5 mg total) by mouth every 6 (six) hours as needed for severe pain. 20 tablet 0   telmisartan (MICARDIS) 80 MG tablet TAKE 1 TABLET BY MOUTH EVERY DAY 90 tablet 1   No facility-administered medications prior to visit.    PAST MEDICAL HISTORY: Past Medical History:  Diagnosis Date   Anxiety    Blood dyscrasia    Chronic kidney disease    Gout    Hyperlipemia    Hypertension    LVH (left ventricular hypertrophy)    12/21/09 Echo -mild asymmetric LVH,EF =>55%   Morbid obesity (HCC)    Myocardial infarction (HCC)    OSA (obstructive sleep apnea)    PAD (peripheral artery disease) ([place])    Pneumonia    Pre-diabetes    Renal artery stenosis ([place]) 12/13/2010   PTCA and Stent - right   Seizures (HCC)    Syncopal episodes    Thrombocytosis     PAST SURGICAL HISTORY: Past Surgical History:  Procedure Laterality Date   BREAST CYST EXCISION     S/P Benign Right Breast Cyst Removal.   BREAST REDUCTION SURGERY  1995   S/P Bilateral breast reduction   [place], 1986   Times  Two.   CORONARY ARTERY BYPASS GRAFT N/A 04/21/2017   Procedure: CORONARY ARTERY BYPASS GRAFTING (CABG) x 2, USING LEFT MAMMARY ARTERY AND RIGHT GREATER SAPHENOUS VEIN HARVESTED ENDOSCOPICALLY;  Surgeon: [name], MD;  Location: [facility];  Service: Open Heart Surgery;  Laterality: N/A;   EXTREMITY CYST EXCISION  1993   left wrist    LEFT HEART CATH AND CORONARY ANGIOGRAPHY N/A 04/17/2017   Procedure: Left Heart Cath and Coronary Angiography;  Surgeon: Jettie Booze, MD;  Location: [facility] [facility];  Service: Cardiovascular;  Laterality: N/A;   PARTIAL HYSTERECTOMY  1994   renal artery stent placement Right    TEE WITHOUT CARDIOVERSION N/A 04/21/2017   Procedure: TRANSESOPHAGEAL ECHOCARDIOGRAM (TEE);  Surgeon: [name], MD;  Location: [facility];  Service: Open Heart Surgery;  Laterality: N/A;   VENTRAL HERNIA REPAIR N/A 06/17/2021   Procedure: OPEN VENTRAL  HERNIA REPAIR WITH MESH;  Surgeon: Kinsinger, Arta Bruce, MD;  Location: [facility];  Service: General;  Laterality: N/A;    FAMILY HISTORY: Family History  Problem Relation Age of Onset   Heart failure Mother    Diabetes Father    Diabetes Sister    Hypertension Sister    Hypertension Sister    Hypertension Sister    Breast cancer Maternal Aunt        2 maternal aunts had breast cancer.   Breast cancer Maternal Grandmother    Sleep apnea Neg Hx     SOCIAL HISTORY: Social History   Socioeconomic History   Marital status: Married    Spouse name: Juanda Crumble   Number of children: 2   Years of education: 16   Highest education level: Not on file  Occupational History   Occupation: TRANSPORTATION COOR.    Employer: [employer]  Tobacco Use   Smoking status: Never   Smokeless tobacco: Never  Vaping Use   Vaping Use: Never used  Substance and Sexual Activity   Alcohol use: No    Alcohol/week: 0.0 standard drinks   Drug use: No   Sexual activity: Yes  Other Topics Concern   Not on file  Social History Narrative   Patient lives at husband and her son, home with family.   Patient has two adult children.   Patient is not drinking any caffeine.   Patient is working full-time.   Patient has a college education.   Patient is right-handed.   Social Determinants of Health   Financial Resource Strain: Low Risk    Difficulty of Paying Living  Expenses: Not hard at all  Food Insecurity: No Food Insecurity   Worried About Charity fundraiser in the Last Year: Never true   [place] in the Last Year: Never true  Transportation Needs: No Transportation Needs   Lack of Transportation (Medical): No   Lack of Transportation (Non-Medical): No  Physical Activity: Insufficiently Active   Days of Exercise per Week: 2 days   Minutes of Exercise per Session: 30 min  Stress: No Stress Concern Present   Feeling of Stress : Not at all  Social Connections: Socially Integrated   Frequency of Communication with Friends and Family: More than three times a week   Frequency of Social Gatherings with Friends and Family: Not on file   Attends Religious Services: More than 4 times per year   Active Member of [employer] or Organizations: Yes   Attends Music therapist: More than 4 times per year   Marital Status: Married  Human resources officer Violence: Not At Risk   Fear of Current or Ex-Partner: No   Emotionally Abused: No   Physically Abused: No   Sexually Abused: No      PHYSICAL EXAM  Vitals:   09/07/21 0923  BP: (!) 170/87  Pulse: 70  Weight: 166 lb 3.2 oz (75.4 kg)  Height: 5\' 1"  (1.549 m)   Body mass index is 31.4 kg/m.  Generalized: Well developed, in no acute distress   Neurological examination  Mentation: Alert oriented to time, place, history taking. Follows all commands speech and language fluent Cranial nerve II-XII: Pupils were equal round reactive to light. Extraocular movements were full, visual field were full on confrontational test.. Head turning and shoulder shrug  were normal and symmetric. Motor: The motor testing reveals 5 over 5 strength of all 4 extremities. Good symmetric motor tone is noted throughout.  Sensory: Sensory testing is intact to soft touch on all 4 extremities. No evidence of extinction is noted.  Coordination: Cerebellar testing reveals good finger-nose-finger and heel-to-shin  bilaterally.  Gait and station: Gait is normal.   Reflexes: Deep tendon reflexes are symmetric and normal bilaterally.   DIAGNOSTIC DATA (LABS, IMAGING, TESTING) - I reviewed patient records, labs, notes, testing and imaging myself where available.  Lab Results  Component Value Date   WBC 6.2 09/02/2021   HGB 14.2 09/02/2021   HCT 44.1 09/02/2021   MCV 83.5 09/02/2021   PLT 515 (H) 09/02/2021      Component Value Date/Time   NA 129 (L) 06/19/2021 0400   NA 138 02/04/2021 1449   NA 139 08/11/2017 1528   K 4.2 06/19/2021 0400   K 4.2 08/11/2017 1528   CL 97 (L) 06/19/2021 0400   CL 102 03/19/2013 1535   CO2 24 06/19/2021 0400   CO2 27 08/11/2017 1528   GLUCOSE 99 06/19/2021 0400   GLUCOSE 93 08/11/2017 1528   GLUCOSE 117 (H) 03/19/2013 1535   BUN 16 06/19/2021 0400   BUN 12 02/04/2021 1449   BUN 15.0 08/11/2017 1528   CREATININE 1.34 (H) 06/19/2021 0400   CREATININE 1.1 08/11/2017 1528   CALCIUM 9.4 06/19/2021 0400   CALCIUM 9.2 08/11/2017 1528   PROT 6.3 (L) 06/19/2021 0400   PROT 7.0 01/14/2021 1521   PROT 7.0 08/11/2017 1528   ALBUMIN 3.2 (L) 06/19/2021 0400   ALBUMIN 4.3 01/14/2021 1521   ALBUMIN 3.6 08/11/2017 1528   AST 29 06/19/2021 0400   AST 27 08/11/2017 1528   ALT 22 06/19/2021 0400   ALT 26 08/11/2017 1528   ALKPHOS 70 06/19/2021 0400   ALKPHOS 88 08/11/2017 1528   BILITOT 0.8 06/19/2021 0400   BILITOT 0.4 01/14/2021 1521   BILITOT 0.37 08/11/2017 1528   GFRNONAA 44 (L) 06/19/2021 0400   GFRAA 49 (L) 11/16/2020 1209   Lab Results  Component Value Date   CHOL 111 11/04/2020   HDL 47 11/04/2020   LDLCALC 54 11/04/2020   TRIG 39 11/04/2020   CHOLHDL 2.4 11/04/2020   Lab Results  Component Value Date   HGBA1C 6.1 (H) 06/17/2021   Lab Results  Component Value Date   RSWNIOEV03 500 03/13/2019   Lab Results  Component Value Date   TSH 0.845 01/22/2021      ASSESSMENT AND PLAN 65 y.o. year old female  has a past medical history of  Anxiety, Blood dyscrasia, Chronic kidney disease, Gout, Hyperlipemia, Hypertension, LVH (left ventricular hypertrophy), Morbid obesity ([place]), Myocardial infarction ([place]), OSA (obstructive sleep apnea), PAD (peripheral artery disease) ([place]), Pneumonia, Pre-diabetes, Renal artery stenosis ([place]) (12/13/2010), Seizures ([place]), Syncopal episodes, and Thrombocytosis. here with:   1.  Seizures  Increase [name] to 150 mg twice a day Advised if she has any seizure event she should let us know  2.  Obstructive sleep apnea  Referral sent to dentistry for dental device  Follow-up in 6 months or sooner if needed    Ward Givens, MSN, NP-C 09/07/2021, 10:05 AM [facility] [address], [place], [place] [address] [phone]

## 2021-09-07 NOTE — Patient Instructions (Signed)
Your Plan:  Continue vimpat 150 mg twice a day Referral placed to dental device If your symptoms worsen or you develop new symptoms please let us know.       Thank you for coming to see [place] at [facility]. I hope we have been able to provide you high quality care today.  You may receive a patient satisfaction survey over the next few weeks. We would appreciate your feedback and comments so that we may continue to improve ourselves and the health of our patients.

## 2021-09-09 ENCOUNTER — Encounter: Payer: Self-pay | Admitting: Hematology

## 2021-09-13 NOTE — Telephone Encounter (Signed)
Referral faxed to Dr. Kae Heller office today. Phone: [phone].

## 2021-09-28 ENCOUNTER — Encounter: Payer: Self-pay | Admitting: Hematology

## 2021-10-19 ENCOUNTER — Encounter: Payer: Self-pay | Admitting: Cardiovascular Disease

## 2021-10-19 MED ORDER — METOPROLOL TARTRATE 50 MG PO TABS: 75.0000 mg | ORAL_TABLET | Freq: Two times a day (BID) | ORAL | 3 refills | Status: DC

## 2021-10-31 ENCOUNTER — Encounter: Payer: Self-pay | Admitting: Adult Health

## 2021-10-31 ENCOUNTER — Telehealth: Payer: Self-pay | Admitting: Neurology

## 2021-10-31 ENCOUNTER — Other Ambulatory Visit: Payer: Self-pay | Admitting: Adult Health

## 2021-10-31 MED ORDER — LACOSAMIDE 150 MG PO TABS: 150.0000 mg | ORAL_TABLET | Freq: Two times a day (BID) | ORAL | 11 refills | Status: DC

## 2021-10-31 MED ORDER — LACOSAMIDE 150 MG PO TABS: 150.0000 mg | ORAL_TABLET | Freq: Two times a day (BID) | ORAL | 0 refills | Status: DC

## 2021-10-31 NOTE — Telephone Encounter (Signed)
Patient called emergency line requesting refill on her lacosamide 150 mg twice daily.  She reported that the [facility] on [address] is 24-hour and would like this prescription sent there.  I ordered 1 month supply sent to the [facility] then I refilled her prescription at her regular pharmacy on [address] starting next month.  Alric Ran, MD

## 2021-11-01 ENCOUNTER — Other Ambulatory Visit: Payer: Self-pay

## 2021-11-01 DIAGNOSIS — D473 Essential (hemorrhagic) thrombocythemia: Secondary | ICD-10-CM

## 2021-11-02 ENCOUNTER — Other Ambulatory Visit: Payer: Self-pay

## 2021-11-02 ENCOUNTER — Inpatient Hospital Stay: Payer: Medicare PPO | Attending: Nurse Practitioner

## 2021-11-02 DIAGNOSIS — D473 Essential (hemorrhagic) thrombocythemia: Secondary | ICD-10-CM

## 2021-11-02 DIAGNOSIS — Z0000 Encounter for general adult medical examination without abnormal findings: Secondary | ICD-10-CM

## 2021-11-02 LAB — CBC WITH DIFFERENTIAL/PLATELET
Abs Immature Granulocytes: 0.02 10*3/uL (ref 0.00–0.07)
Basophils Absolute: 0.1 10*3/uL (ref 0.0–0.1)
Basophils Relative: 1 %
Eosinophils Absolute: 0.3 10*3/uL (ref 0.0–0.5)
Eosinophils Relative: 4 %
HCT: 46.8 % — ABNORMAL HIGH (ref 36.0–46.0)
Hemoglobin: 15.1 g/dL — ABNORMAL HIGH (ref 12.0–15.0)
Immature Granulocytes: 0 %
Lymphocytes Relative: 21 %
Lymphs Abs: 1.4 10*3/uL (ref 0.7–4.0)
MCH: 27.2 pg (ref 26.0–34.0)
MCHC: 32.3 g/dL (ref 30.0–36.0)
MCV: 84.3 fL (ref 80.0–100.0)
Monocytes Absolute: 0.5 10*3/uL (ref 0.1–1.0)
Monocytes Relative: 7 %
Neutro Abs: 4.3 10*3/uL (ref 1.7–7.7)
Neutrophils Relative %: 67 %
Platelets: 463 10*3/uL — ABNORMAL HIGH (ref 150–400)
RBC: 5.55 MIL/uL — ABNORMAL HIGH (ref 3.87–5.11)
RDW: 16.9 % — ABNORMAL HIGH (ref 11.5–15.5)
WBC: 6.5 10*3/uL (ref 4.0–10.5)
nRBC: 0 % (ref 0.0–0.2)

## 2021-11-02 LAB — CMP (CANCER CENTER ONLY)
ALT: 31 U/L (ref 0–44)
AST: 29 U/L (ref 15–41)
Albumin: 3.9 g/dL (ref 3.5–5.0)
Alkaline Phosphatase: 96 U/L (ref 38–126)
Anion gap: 6 (ref 5–15)
BUN: 22 mg/dL (ref 8–23)
CO2: 29 mmol/L (ref 22–32)
Calcium: 9.9 mg/dL (ref 8.9–10.3)
Chloride: 102 mmol/L (ref 98–111)
Creatinine: 1.28 mg/dL — ABNORMAL HIGH (ref 0.44–1.00)
GFR, Estimated: 46 mL/min — ABNORMAL LOW (ref >60)
Glucose, Bld: 77 mg/dL (ref 70–99)
Potassium: 4 mmol/L (ref 3.5–5.1)
Sodium: 137 mmol/L (ref 135–145)
Total Bilirubin: 0.6 mg/dL (ref 0.3–1.2)
Total Protein: 6.9 g/dL (ref 6.5–8.1)

## 2021-11-02 LAB — IRON AND IRON BINDING CAPACITY (CC-WL,HP ONLY)
Iron: 83 ug/dL (ref 28–170)
Saturation Ratios: 19 % (ref 10.4–31.8)
TIBC: 430 ug/dL (ref 250–450)
UIBC: 347 ug/dL (ref 148–442)

## 2021-11-02 LAB — FERRITIN: Ferritin: 26 ng/mL (ref 11–307)

## 2021-11-03 NOTE — Progress Notes (Signed)
Erroneous - no show

## 2021-11-03 NOTE — Patient Instructions (Signed)

## 2021-11-04 ENCOUNTER — Telehealth: Payer: Self-pay

## 2021-11-04 ENCOUNTER — Ambulatory Visit: Payer: HMO | Admitting: Adult Health

## 2021-11-04 ENCOUNTER — Encounter: Payer: HMO | Admitting: Internal Medicine

## 2021-11-04 ENCOUNTER — Ambulatory Visit: Payer: BC Managed Care – PPO

## 2021-11-04 DIAGNOSIS — I1310 Hypertensive heart and chronic kidney disease without heart failure, with stage 1 through stage 4 chronic kidney disease, or unspecified chronic kidney disease: Secondary | ICD-10-CM

## 2021-11-04 DIAGNOSIS — E1122 Type 2 diabetes mellitus with diabetic chronic kidney disease: Secondary | ICD-10-CM

## 2021-11-04 DIAGNOSIS — E6609 Other obesity due to excess calories: Secondary | ICD-10-CM

## 2021-11-04 NOTE — Chronic Care Management (AMB) (Signed)
° ° °  Chronic Care Management Pharmacy Assistant   Name: GRAELYN BIHL  MRN: [id number] DOB: [birth year]   Reason for Encounter: Disease State/ Diabetes  Recent office visits:  None  Recent consult visits:  09-07-2021 Ward Givens, NP (Neurology). Referral placed to dentistry. Follow up in 6 months.   09-02-2021 Truitt Merle, MD (Oncology). Follow up in 4 months.  08-18-2021 Star Age, MD (Neurology). Nocturnal polysomnography ordered.  Hospital visits:  Medication Reconciliation was completed by comparing discharge summary, patients EMR and Pharmacy list, and upon discussion with patient.   Admitted to the hospital on 06-19-2021 due to seizure. Discharge date was 06-19-2021. Discharged from [facility]?Medications Started at [facility] Discharge:?? None   Medication Changes at Hospital Discharge: None   Medications Discontinued at Hospital Discharge: None   Medications that remain the same after Hospital Discharge:??  -All other medications will remain the same.      Medications: Outpatient Encounter Medications as of 11/04/2021  Medication Sig   amLODipine (NORVASC) 2.5 MG tablet TAKE 1 TABLET(2.5 MG) BY MOUTH DAILY   anagrelide ([name]) 1 MG capsule Take 1 capsule (1 mg total) by mouth daily.   aspirin EC 81 MG tablet Take 81 mg by mouth daily. Swallow whole.   atorvastatin (LIPITOR) 40 MG tablet TAKE 1 TABLET BY MOUTH DAILY AT 6 PM   Cholecalciferol (VITAMIN D3) 2000 UNITS capsule Take 2,000 Units by mouth daily.    ibuprofen (ADVIL) 800 MG tablet Take 1 tablet (800 mg total) by mouth every 8 (eight) hours as needed.   [START ON 11/28/2021] Lacosamide ([name]) 150 MG TABS Take 1 tablet (150 mg total) by mouth 2 (two) times daily.   Lacosamide 150 MG TABS Take 1 tablet (150 mg total) by mouth in the morning and at bedtime.   Magnesium 250 MG TABS Take 250 mg by mouth in the morning and at bedtime.   metoprolol tartrate (LOPRESSOR) 50 MG tablet Take  1.5 tablets (75 mg total) by mouth 2 (two) times daily.   Multiple Vitamin (MULTIVITAMIN) capsule Take 1 capsule by mouth daily.   oxyCODONE (OXY IR/ROXICODONE) 5 MG immediate release tablet Take 1 tablet (5 mg total) by mouth every 6 (six) hours as needed for severe pain.   telmisartan (MICARDIS) 80 MG tablet TAKE 1 TABLET BY MOUTH EVERY DAY   No facility-administered encounter medications on file as of 11/04/2021.   Recent Relevant Labs: Lab Results  Component Value Date/Time   HGBA1C 6.1 (H) 06/17/2021 01:12 PM   HGBA1C 6.0 (H) 06/11/2021 11:41 AM   MICROALBUR 10 11/04/2020 05:22 PM   MICROALBUR 80 03/13/2019 12:10 PM    Kidney Function Lab Results  Component Value Date/Time   CREATININE 1.28 (H) 11/02/2021 10:15 AM   CREATININE 1.34 (H) 06/19/2021 04:00 AM   CREATININE 1.28 (H) 06/17/2021 01:12 PM   CREATININE 1.1 08/11/2017 03:28 PM   CREATININE 1.3 (H) 02/09/2017 01:52 PM   GFRNONAA 46 (L) 11/02/2021 10:15 AM   GFRAA 49 (L) 11/16/2020 12:09 PM    11-04-2021: 1st attempt left VM 11-05-2021: 2nd attempt left VM 11-09-2021: 3rd attempt left VM  Care Gaps: PNA Vac overdue Covid booster overdue Shingrix overdue Yearly ophthalmology overdue AWV 11-11-2021  Star Rating Drugs: Atorvastatin 40 mg- Last filled 09-23-2021 90 [facility] Telmisartan 80 mg- Last filled 09-23-2021 90 DS [place] Clinical Pharmacist Assistant [phone]

## 2021-11-10 ENCOUNTER — Ambulatory Visit: Payer: Medicare PPO

## 2021-11-11 ENCOUNTER — Ambulatory Visit (INDEPENDENT_AMBULATORY_CARE_PROVIDER_SITE_OTHER): Payer: Medicare PPO

## 2021-11-11 ENCOUNTER — Other Ambulatory Visit: Payer: Self-pay

## 2021-11-11 VITALS — BP 138/82 | HR 80 | Temp 97.9°F | Ht 60.6 in | Wt 163.6 lb

## 2021-11-11 DIAGNOSIS — Z0000 Encounter for general adult medical examination without abnormal findings: Secondary | ICD-10-CM

## 2021-11-11 DIAGNOSIS — Z23 Encounter for immunization: Secondary | ICD-10-CM

## 2021-11-11 NOTE — Progress Notes (Signed)
This visit occurred during the SARS-CoV-2 public health emergency.  Safety protocols were in place, including screening questions prior to the visit, additional usage of staff PPE, and extensive cleaning of exam room while observing appropriate contact time as indicated for disinfecting solutions.  Subjective:   Sara [name] is a 66 y.o. female who presents for an Initial Medicare Annual Wellness Visit.  Review of Systems     Cardiac Risk Factors include: advanced age (>85men, >60 women);diabetes mellitus;hypertension;obesity (BMI >30kg/m2)     Objective:    Today's Vitals   11/11/21 0831 11/11/21 0857  BP: (!) 180/82 138/82  Pulse: 80   Temp: 97.9 F (36.6 C)   TempSrc: Oral   SpO2: 99%   Weight: 163 lb 9.6 oz (74.2 kg)   Height: 5' 0.6" (1.539 m)    Body mass index is 31.32 kg/m.  Advanced Directives 11/11/2021 06/19/2021 06/17/2021 06/11/2021 01/22/2021 11/04/2020 04/03/2020  Does Patient Have a Medical Advance Directive? No No No No No No No  Would patient like information on creating a medical advance directive? No - Patient declined - No - Patient declined - No - Patient declined No - Patient declined -  Pre-existing out of facility DNR order (yellow form or pink MOST form) - - - - - - -    Current Medications (verified) Outpatient Encounter Medications as of 11/11/2021  Medication Sig   amLODipine (NORVASC) 2.5 MG tablet TAKE 1 TABLET(2.5 MG) BY MOUTH DAILY   anagrelide ([name]) 1 MG capsule Take 1 capsule (1 mg total) by mouth daily.   aspirin EC 81 MG tablet Take 81 mg by mouth daily. Swallow whole.   atorvastatin (LIPITOR) 40 MG tablet TAKE 1 TABLET BY MOUTH DAILY AT 6 PM   Cholecalciferol (VITAMIN D3) 2000 UNITS capsule Take 2,000 Units by mouth daily.    [START ON 11/28/2021] Lacosamide ([name]) 150 MG TABS Take 1 tablet (150 mg total) by mouth 2 (two) times daily.   Lacosamide 150 MG TABS Take 1 tablet (150 mg total) by mouth in the morning and at bedtime.    Magnesium 250 MG TABS Take 250 mg by mouth in the morning and at bedtime.   metoprolol tartrate (LOPRESSOR) 50 MG tablet Take 1.5 tablets (75 mg total) by mouth 2 (two) times daily.   Multiple Vitamin (MULTIVITAMIN) capsule Take 1 capsule by mouth daily.   telmisartan (MICARDIS) 80 MG tablet TAKE 1 TABLET BY MOUTH EVERY DAY   ibuprofen (ADVIL) 800 MG tablet Take 1 tablet (800 mg total) by mouth every 8 (eight) hours as needed. (Patient not taking: Reported on 11/11/2021)   oxyCODONE (OXY IR/ROXICODONE) 5 MG immediate release tablet Take 1 tablet (5 mg total) by mouth every 6 (six) hours as needed for severe pain. (Patient not taking: Reported on 11/11/2021)   No facility-administered encounter medications on file as of 11/11/2021.    Allergies (verified) Sulfa antibiotics   History: Past Medical History:  Diagnosis Date   Anxiety    Blood dyscrasia    Chronic kidney disease    Gout    Hyperlipemia    Hypertension    LVH (left ventricular hypertrophy)    12/21/09 Echo -mild asymmetric LVH,EF =>55%   Morbid obesity (HCC)    Myocardial infarction (HCC)    OSA (obstructive sleep apnea)    PAD (peripheral artery disease) ([place])    Pneumonia    Pre-diabetes    Renal artery stenosis ([place]) 12/13/2010   PTCA and Stent - right  Seizures ([place])    Syncopal episodes    Thrombocytosis    Past Surgical History:  Procedure Laterality Date   BREAST CYST EXCISION     S/P Benign Right Breast Cyst Removal.   BREAST REDUCTION SURGERY  1995   S/P Bilateral breast reduction   CESAREAN SECTION  1980, 1986   Times  Two.   CORONARY ARTERY BYPASS GRAFT N/A 04/21/2017   Procedure: CORONARY ARTERY BYPASS GRAFTING (CABG) x 2, USING LEFT MAMMARY ARTERY AND RIGHT GREATER SAPHENOUS VEIN HARVESTED ENDOSCOPICALLY;  Surgeon: [name], MD;  Location: [facility];  Service: Open Heart Surgery;  Laterality: N/A;   EXTREMITY CYST EXCISION  1993   left wrist   LEFT HEART CATH AND CORONARY ANGIOGRAPHY N/A  04/17/2017   Procedure: Left Heart Cath and Coronary Angiography;  Surgeon: Jettie Booze, MD;  Location: [facility];  Service: Cardiovascular;  Laterality: N/A;   PARTIAL HYSTERECTOMY  1994   renal artery stent placement Right    TEE WITHOUT CARDIOVERSION N/A 04/21/2017   Procedure: TRANSESOPHAGEAL ECHOCARDIOGRAM (TEE);  Surgeon: [name], MD;  Location: [facility];  Service: Open Heart Surgery;  Laterality: N/A;   VENTRAL HERNIA REPAIR N/A 06/17/2021   Procedure: OPEN VENTRAL HERNIA REPAIR WITH MESH;  Surgeon: Kinsinger, Arta Bruce, MD;  Location: [facility];  Service: General;  Laterality: N/A;   Family History  Problem Relation Age of Onset   Heart failure Mother    Diabetes Father    Diabetes Sister    Hypertension Sister    Hypertension Sister    Hypertension Sister    Breast cancer Maternal Aunt        2 maternal aunts had breast cancer.   Breast cancer Maternal Grandmother    Sleep apnea Neg Hx    Social History   Socioeconomic History   Marital status: Married    Spouse name: Juanda Crumble   Number of children: 2   Years of education: 16   Highest education level: Not on file  Occupational History   Occupation: TRANSPORTATION COOR.    Employer: [employer]  Tobacco Use   Smoking status: Never   Smokeless tobacco: Never  Vaping Use   Vaping Use: Never used  Substance and Sexual Activity   Alcohol use: No    Alcohol/week: 0.0 standard drinks   Drug use: No   Sexual activity: Yes  Other Topics Concern   Not on file  Social History Narrative   Patient lives at husband and her son, home with family.   Patient has two adult children.   Patient is not drinking any caffeine.   Patient is working full-time.   Patient has a college education.   Patient is right-handed.   Social Determinants of Health   Financial Resource Strain: Low Risk    Difficulty of Paying Living Expenses: Not hard at all  Food Insecurity: No Food Insecurity   Worried About  Charity fundraiser in the Last Year: Never true   [place] in the Last Year: Never true  Transportation Needs: No Transportation Needs   Lack of Transportation (Medical): No   Lack of Transportation (Non-Medical): No  Physical Activity: Insufficiently Active   Days of Exercise per Week: 3 days   Minutes of Exercise per Session: 30 min  Stress: Stress Concern Present   Feeling of Stress : To some extent  Social Connections: Not on file    Tobacco Counseling Counseling given: Not Answered  Clinical Intake:  Pre-visit preparation completed: Yes  Pain : No/denies pain     Nutritional Status: BMI > 30  Obese Nutritional Risks: None Diabetes: Yes  How often do you need to have someone help you when you read instructions, pamphlets, or other written materials from your doctor or pharmacy?: 1 - Never What is the last grade level you completed in school?: college  Diabetic?yes  Nutrition Risk Assessment:  Has the patient had any N/V/D within the last 2 months?  No  Does the patient have any non-healing wounds?  No  Has the patient had any unintentional weight loss or weight gain?  No   Diabetes:  Is the patient diabetic?  Yes  If diabetic, was a CBG obtained today?  No  Did the patient bring in their glucometer from home?  No  How often do you monitor your CBG's? 2-3 weekly.   Financial Strains and Diabetes Management:  Are you having any financial strains with the device, your supplies or your medication? No .  Does the patient want to be seen by Chronic Care Management for management of their diabetes?  No  Would the patient like to be referred to a Nutritionist or for Diabetic Management?  No   Diabetic Exams:  Diabetic Eye Exam: Overdue for diabetic eye exam. Pt has been advised about the importance in completing this exam. Patient advised to call and schedule an eye exam. Diabetic Foot Exam: Completed 03/18/2021   Interpreter Needed?: No  Information  entered by :: NAllen LPN   Activities of Daily Living In your present state of health, do you have any difficulty performing the following activities: 11/11/2021 06/17/2021  Hearing? N N  Vision? N N  Difficulty concentrating or making decisions? N N  Walking or climbing stairs? N N  Dressing or bathing? N N  Doing errands, shopping? N N  Preparing Food and eating ? N -  Using the Toilet? N -  In the past six months, have you accidently leaked urine? N -  Do you have problems with loss of bowel control? N -  Managing your Medications? N -  Managing your Finances? N -  Housekeeping or managing your Housekeeping? N -  Some recent data might be hidden    Patient Care Team: [name], MD as PCP - General (Internal Medicine) Croitoru, Dani Gobble, MD as PCP - Cardiology (Cardiology) Mayford Knife, [facility] (Pharmacist) Rex Kras Claudette Stapler, RN as [place] any recent Medical Services you may have received from other than [facility] providers in the past year (date may be approximate).     Assessment:   This is a routine wellness examination for [place].  Hearing/Vision screen Vision Screening - Comments:: Regular eye exams, [facility]  Dietary issues and exercise activities discussed: Current Exercise Habits: Home exercise routine, Type of exercise: walking, Time (Minutes): 30, Frequency (Times/Week): 3, Weekly Exercise (Minutes/Week): 90   Goals Addressed             This Visit's Progress    Patient Stated       11/11/2021, wants to maintain current weight       Depression Screen PHQ 2/9 Scores 11/11/2021 11/04/2020 09/02/2019 03/13/2019 12/24/2018 12/04/2018 11/22/2018  PHQ - 2 Score 3 0 0 0 2 0 0  PHQ- 9 Score 3 - - - 8 - -    Fall Risk Fall Risk  11/11/2021 11/04/2020 09/02/2019 03/13/2019 12/24/2018  Falls in the past year?  0 0 0 0 0  Risk for fall due to : Medication side effect - - - -  Follow up Falls evaluation completed;Education  provided;Falls prevention discussed - - - -    FALL RISK PREVENTION PERTAINING TO THE HOME:  Any stairs in or around the home? Yes  If so, are there any without handrails? No  Home free of loose throw rugs in walkways, pet beds, electrical cords, etc? Yes  Adequate lighting in your home to reduce risk of falls? Yes   ASSISTIVE DEVICES UTILIZED TO PREVENT FALLS:  Life alert? No  Use of a cane, walker or w/c? No  Grab bars in the bathroom? No  Shower chair or bench in shower? No  Elevated toilet seat or a handicapped toilet? Yes   TIMED UP AND GO:  Was the test performed? No .    Gait steady and fast without use of assistive device  Cognitive Function: MMSE - Mini Mental State Exam 07/23/2019  Orientation to time 5  Orientation to Place 5  Registration 3  Attention/ Calculation 5  Recall 2  Language- name 2 objects 2  Language- repeat 1  Language- follow 3 step command 3  Language- read & follow direction 1  Write a sentence 1  Copy design 1  Total score 29     6CIT Screen 11/11/2021 11/04/2020  What Year? 0 points 0 points  What month? 0 points 0 points  What time? 0 points 0 points  Count back from 20 0 points 0 points  Months in reverse 0 points 0 points  Repeat phrase 2 points 2 points  Total Score 2 2    Immunizations Immunization History  Administered Date(s) Administered   Fluad Quad(high Dose 65+) 11/04/2020, 07/19/2021   Influenza,inj,Quad PF,6+ Mos 07/09/2019   PFIZER(Purple Top)SARS-COV-2 Vaccination 11/30/2019, 12/23/2019, 08/10/2020   PNEUMOCOCCAL CONJUGATE-20 11/11/2021   Tdap 03/13/2019    TDAP status: Up to date  Flu Vaccine status: Up to date  Pneumococcal vaccine status: Completed during today's visit.  Covid-19 vaccine status: Completed vaccines  Qualifies for Shingles Vaccine? Yes   Zostavax completed No   Shingrix Completed?: No.    Education has been provided regarding the importance of this vaccine. Patient has been advised to  call insurance company to determine out of pocket expense if they have not yet received this vaccine. Advised may also receive vaccine at local pharmacy or Health Dept. Verbalized acceptance and understanding.  Screening Tests Health Maintenance  Topic Date Due   Zoster Vaccines- Shingrix (1 of 2) Never done   COVID-19 Vaccine (4 - Booster for Pfizer series) 10/05/2020   OPHTHALMOLOGY EXAM  04/14/2021   HEMOGLOBIN A1C  12/18/2021   FOOT EXAM  03/18/2022   COLONOSCOPY (Pts 45-53yrs Insurance coverage will need to be confirmed)  06/01/2022   MAMMOGRAM  05/15/2023   TETANUS/TDAP  03/12/2029   Pneumonia Vaccine 10+ Years old  Completed   INFLUENZA VACCINE  Completed   DEXA SCAN  Completed   Hepatitis C Screening  Completed   HPV VACCINES  Aged Out    Health Maintenance  Health Maintenance Due  Topic Date Due   Zoster Vaccines- Shingrix (1 of 2) Never done   COVID-19 Vaccine (4 - Booster for Pfizer series) 10/05/2020   OPHTHALMOLOGY EXAM  04/14/2021    Colorectal cancer screening: Type of screening: Colonoscopy. Completed 06/01/2012. Repeat every 10 years  Mammogram status: Completed 05/14/2021. Repeat every year  Bone Density status: Completed 04/13/2018.   Lung  Cancer Screening: (Low Dose CT Chest recommended if Age 18-80 years, 30 pack-year currently smoking OR have quit w/in 15years.) does not qualify.   Lung Cancer Screening Referral: no  Additional Screening:  Hepatitis C Screening: does qualify; Completed 11/28/2012  Vision Screening: Recommended annual ophthalmology exams for early detection of glaucoma and other disorders of the eye. Is the patient up to date with their annual eye exam?  No  Who is the provider or what is the name of the office in which the patient attends annual eye exams? [facility] If pt is not established with a provider, would they like to be referred to a provider to establish care? No .   Dental Screening: Recommended annual dental exams for  proper oral hygiene  Community Resource Referral / Chronic Care Management: CRR required this visit?  No   CCM required this visit?  No      Plan:     I have personally reviewed and noted the following in the patients chart:   Medical and social history Use of alcohol, tobacco or illicit drugs  Current medications and supplements including opioid prescriptions. Patient is not currently taking opioid prescriptions. Functional ability and status Nutritional status Physical activity Advanced directives List of other physicians Hospitalizations, surgeries, and ER visits in previous 12 months Vitals Screenings to include cognitive, depression, and falls Referrals and appointments  In addition, I have reviewed and discussed with patient certain preventive protocols, quality metrics, and best practice recommendations. A written personalized care plan for preventive services as well as general preventive health recommendations were provided to patient.     Kellie Simmering, LPN   [id number]   Nurse Notes: none

## 2021-11-11 NOTE — Patient Instructions (Signed)
Sara [name] , Thank you for taking time to come for your Medicare Wellness Visit. I appreciate your ongoing commitment to your health goals. Please review the following plan we discussed and let me know if I can assist you in the future.   Screening recommendations/referrals: Colonoscopy: completed 06/01/2012 Mammogram: completed 05/14/2021 Bone Density: completed 04/13/2018 Recommended yearly ophthalmology/optometry visit for glaucoma screening and checkup Recommended yearly dental visit for hygiene and checkup  Vaccinations: Influenza vaccine: completed 07/19/2021 Pneumococcal vaccine: today Tdap vaccine: completed 03/13/2019, due 03/12/2029 Shingles vaccine:  discussed   Covid-19: 08/10/2020, 12/23/2019, 11/30/2019  Advanced directives: Advance directive discussed with you today. Even though you declined this today please call our office should you change your mind and we can give you the proper paperwork for you to fill out.  Conditions/risks identified: none  Next appointment: Follow up in one year for your annual wellness visit    Preventive Care 65 Years and Older, Female Preventive care refers to lifestyle choices and visits with your health care provider that can promote health and wellness. What does preventive care include? A yearly physical exam. This is also called an annual well check. Dental exams once or twice a year. Routine eye exams. Ask your health care provider how often you should have your eyes checked. Personal lifestyle choices, including: Daily care of your teeth and gums. Regular physical activity. Eating a healthy diet. Avoiding tobacco and drug use. Limiting alcohol use. Practicing safe sex. Taking low-dose aspirin every day. Taking vitamin and mineral supplements as recommended by your health care provider. What happens during an annual well check? The services and screenings done by your health care provider during your annual well check will depend on your  age, overall health, lifestyle risk factors, and family history of disease. Counseling  Your health care provider may ask you questions about your: Alcohol use. Tobacco use. Drug use. Emotional well-being. Home and relationship well-being. Sexual activity. Eating habits. History of falls. Memory and ability to understand (cognition). Work and work Statistician. Reproductive health. Screening  You may have the following tests or measurements: Height, weight, and BMI. Blood pressure. Lipid and cholesterol levels. These may be checked every 5 years, or more frequently if you are over 7 years old. Skin check. Lung cancer screening. You may have this screening every year starting at age 32 if you have a 30-pack-year history of smoking and currently smoke or have quit within the past 15 years. Fecal occult blood test (FOBT) of the stool. You may have this test every year starting at age 37. Flexible sigmoidoscopy or colonoscopy. You may have a sigmoidoscopy every 5 years or a colonoscopy every 10 years starting at age 78. Hepatitis C blood test. Hepatitis B blood test. Sexually transmitted disease (STD) testing. Diabetes screening. This is done by checking your blood sugar (glucose) after you have not eaten for a while (fasting). You may have this done every 1-3 years. Bone density scan. This is done to screen for osteoporosis. You may have this done starting at age 35. Mammogram. This may be done every 1-2 years. Talk to your health care provider about how often you should have regular mammograms. Talk with your health care provider about your test results, treatment options, and if necessary, the need for more tests. Vaccines  Your health care provider may recommend certain vaccines, such as: Influenza vaccine. This is recommended every year. Tetanus, diphtheria, and acellular pertussis (Tdap, Td) vaccine. You may need a Td booster every 10 years.  Zoster vaccine. You may need this after  age 60. Pneumococcal 13-valent conjugate (PCV13) vaccine. One dose is recommended after age 27. Pneumococcal polysaccharide (PPSV23) vaccine. One dose is recommended after age 72. Talk to your health care provider about which screenings and vaccines you need and how often you need them. This information is not intended to replace advice given to you by your health care provider. Make sure you discuss any questions you have with your health care provider. Document Released: 11/06/2015 Document Revised: 06/29/2016 Document Reviewed: 08/11/2015 Elsevier Interactive Patient Education  2017 [employer] Prevention in the Home Falls can cause injuries. They can happen to people of all ages. There are many things you can do to make your home safe and to help prevent falls. What can I do on the outside of my home? Regularly fix the edges of walkways and driveways and fix any cracks. Remove anything that might make you trip as you walk through a door, such as a raised step or threshold. Trim any bushes or trees on the path to your home. Use bright outdoor lighting. Clear any walking paths of anything that might make someone trip, such as rocks or tools. Regularly check to see if handrails are loose or broken. Make sure that both sides of any steps have handrails. Any raised decks and porches should have guardrails on the edges. Have any leaves, snow, or ice cleared regularly. Use sand or salt on walking paths during winter. Clean up any spills in your garage right away. This includes oil or grease spills. What can I do in the bathroom? Use night lights. Install grab bars by the toilet and in the tub and shower. Do not use towel bars as grab bars. Use non-skid mats or decals in the tub or shower. If you need to sit down in the shower, use a plastic, non-slip stool. Keep the floor dry. Clean up any water that spills on the floor as soon as it happens. Remove soap buildup in the tub or shower  regularly. Attach bath mats securely with double-sided non-slip rug tape. Do not have throw rugs and other things on the floor that can make you trip. What can I do in the bedroom? Use night lights. Make sure that you have a light by your bed that is easy to reach. Do not use any sheets or blankets that are too big for your bed. They should not hang down onto the floor. Have a firm chair that has side arms. You can use this for support while you get dressed. Do not have throw rugs and other things on the floor that can make you trip. What can I do in the kitchen? Clean up any spills right away. Avoid walking on wet floors. Keep items that you use a lot in easy-to-reach places. If you need to reach something above you, use a strong step stool that has a grab bar. Keep electrical cords out of the way. Do not use floor polish or wax that makes floors slippery. If you must use wax, use non-skid floor wax. Do not have throw rugs and other things on the floor that can make you trip. What can I do with my stairs? Do not leave any items on the stairs. Make sure that there are handrails on both sides of the stairs and use them. Fix handrails that are broken or loose. Make sure that handrails are as long as the stairways. Check any carpeting to make sure that  it is firmly attached to the stairs. Fix any carpet that is loose or worn. Avoid having throw rugs at the top or bottom of the stairs. If you do have throw rugs, attach them to the floor with carpet tape. Make sure that you have a light switch at the top of the stairs and the bottom of the stairs. If you do not have them, ask someone to add them for you. What else can I do to help prevent falls? Wear shoes that: Do not have high heels. Have rubber bottoms. Are comfortable and fit you well. Are closed at the toe. Do not wear sandals. If you use a stepladder: Make sure that it is fully opened. Do not climb a closed stepladder. Make sure that  both sides of the stepladder are locked into place. Ask someone to hold it for you, if possible. Clearly mark and make sure that you can see: Any grab bars or handrails. First and last steps. Where the edge of each step is. Use tools that help you move around (mobility aids) if they are needed. These include: Canes. Walkers. Scooters. Crutches. Turn on the lights when you go into a dark area. Replace any light bulbs as soon as they burn out. Set up your furniture so you have a clear path. Avoid moving your furniture around. If any of your floors are uneven, fix them. If there are any pets around you, be aware of where they are. Review your medicines with your doctor. Some medicines can make you feel dizzy. This can increase your chance of falling. Ask your doctor what other things that you can do to help prevent falls. This information is not intended to replace advice given to you by your health care provider. Make sure you discuss any questions you have with your health care provider. Document Released: 08/06/2009 Document Revised: 03/17/2016 Document Reviewed: 11/14/2014 Elsevier Interactive Patient Education  2017 [employer].

## 2021-11-24 ENCOUNTER — Encounter: Payer: Self-pay | Admitting: Internal Medicine

## 2021-11-29 ENCOUNTER — Telehealth: Payer: Self-pay | Admitting: *Deleted

## 2021-11-29 NOTE — Telephone Encounter (Signed)
Received fax that pt started oral appliance on 11/18/2021 Dr. Ron Parker [phone].

## 2021-12-15 ENCOUNTER — Encounter: Payer: Self-pay | Admitting: Adult Health

## 2021-12-15 ENCOUNTER — Encounter: Payer: Self-pay | Admitting: Internal Medicine

## 2021-12-16 ENCOUNTER — Encounter: Payer: Self-pay | Admitting: Hematology

## 2021-12-19 NOTE — Telephone Encounter (Signed)
Lets get her scheduled for in office or VV

## 2021-12-20 ENCOUNTER — Ambulatory Visit: Payer: Medicare PPO | Admitting: Nurse Practitioner

## 2021-12-20 ENCOUNTER — Other Ambulatory Visit: Payer: Self-pay

## 2021-12-20 ENCOUNTER — Encounter: Payer: Self-pay | Admitting: Nurse Practitioner

## 2021-12-20 VITALS — BP 132/78 | HR 69 | Temp 98.3°F | Ht 60.6 in | Wt 170.6 lb

## 2021-12-20 DIAGNOSIS — N3944 Nocturnal enuresis: Secondary | ICD-10-CM | POA: Diagnosis not present

## 2021-12-20 DIAGNOSIS — N1831 Chronic kidney disease, stage 3a: Secondary | ICD-10-CM | POA: Diagnosis not present

## 2021-12-20 DIAGNOSIS — I1310 Hypertensive heart and chronic kidney disease without heart failure, with stage 1 through stage 4 chronic kidney disease, or unspecified chronic kidney disease: Secondary | ICD-10-CM

## 2021-12-20 DIAGNOSIS — G4089 Other seizures: Secondary | ICD-10-CM

## 2021-12-20 DIAGNOSIS — Z23 Encounter for immunization: Secondary | ICD-10-CM

## 2021-12-20 DIAGNOSIS — E1122 Type 2 diabetes mellitus with diabetic chronic kidney disease: Secondary | ICD-10-CM | POA: Diagnosis not present

## 2021-12-20 LAB — HEMOGLOBIN A1C
Est. average glucose Bld gHb Est-mCnc: 128 mg/dL
Hgb A1c MFr Bld: 6.1 % — ABNORMAL HIGH (ref 4.8–5.6)

## 2021-12-20 LAB — POCT URINALYSIS DIPSTICK
Bilirubin, UA: NEGATIVE
Blood, UA: NEGATIVE
Glucose, UA: NEGATIVE
Ketones, UA: NEGATIVE
Leukocytes, UA: NEGATIVE
Nitrite, UA: NEGATIVE
Protein, UA: NEGATIVE
Spec Grav, UA: <=1.005 — AB (ref 1.010–1.025)
Urobilinogen, UA: 0.2 E.U./dL
pH, UA: 6 (ref 5.0–8.0)

## 2021-12-20 NOTE — Patient Instructions (Signed)

## 2021-12-20 NOTE — Progress Notes (Signed)
I,Tianna Badgett,acting as a Education administrator for [employer], FNP.,have documented all relevant documentation on the behalf of [name], FNP,as directed by  [name], FNP while in the presence of [name], [place].  This visit occurred during the SARS-CoV-2 public health emergency.  Safety protocols were in place, including screening questions prior to the visit, additional usage of staff PPE, and extensive cleaning of exam room while observing appropriate contact time as indicated for disinfecting solutions.  Subjective:     Patient ID: Sara [name] , female    DOB: 01/16/1956 , 66 y.o.   MRN: [id number]   Chief Complaint  Patient presents with   Hypertension    HPI  She is here today for DM/HTN f/u.  She denies having headaches, chest pain and shortness of breath.  She had an episode last week of urinary incontinence. She had been having more nocturia. She had been drinking 7 bottles of water a day. She is taking an iron supplement now from the hematologist. She reports her husband said she made a "funny noise" - she has called Dr Jannifer Franklin.   Hypertension This is a chronic problem. The current episode started more than 1 year ago. The problem has been gradually improving since onset. The problem is uncontrolled. Pertinent negatives include no blurred vision, chest pain, palpitations or shortness of breath. The current treatment provides moderate improvement. Compliance problems include exercise.  Hypertensive end-organ damage includes CAD/MI.  Diabetes She presents for her follow-up diabetic visit. She has type 2 diabetes mellitus. There are no hypoglycemic associated symptoms. There are no diabetic associated symptoms. Pertinent negatives for diabetes include no blurred vision and no chest pain. There are no hypoglycemic complications. Diabetic complications include heart disease and nephropathy. Risk factors for coronary artery disease include diabetes mellitus, dyslipidemia,  hypertension, obesity, post-menopausal and sedentary lifestyle. She is compliant with treatment most of the time. She is following a diabetic diet. She participates in exercise three times a week. Her breakfast blood glucose is taken between 7-8 am. Her breakfast blood glucose range is generally 90-110 mg/dl. An ACE inhibitor/angiotensin II receptor blocker is being taken. Eye exam is current.    Past Medical History:  Diagnosis Date   Anxiety    Blood dyscrasia    Chronic kidney disease    Gout    Hyperlipemia    Hypertension    LVH (left ventricular hypertrophy)    12/21/09 Echo -mild asymmetric LVH,EF =>55%   Morbid obesity (HCC)    Myocardial infarction (HCC)    OSA (obstructive sleep apnea)    PAD (peripheral artery disease) (HCC)    Pneumonia    Pre-diabetes    Renal artery stenosis ([place]) 12/13/2010   PTCA and Stent - right   Seizures (HCC)    Syncopal episodes    Thrombocytosis      Family History  Problem Relation Age of Onset   Heart failure Mother    Diabetes Father    Diabetes Sister    Hypertension Sister    Hypertension Sister    Hypertension Sister    Breast cancer Maternal Aunt        2 maternal aunts had breast cancer.   Breast cancer Maternal Grandmother    Sleep apnea Neg Hx      Current Outpatient Medications:    amLODipine (NORVASC) 2.5 MG tablet, TAKE 1 TABLET(2.5 MG) BY MOUTH DAILY, Disp: 90 tablet, Rfl: 1   anagrelide ([name]) 1 MG capsule, Take 1 capsule (1 mg total) by  mouth daily., Disp: 30 capsule, Rfl: 5   aspirin EC 81 MG tablet, Take 81 mg by mouth daily. Swallow whole., Disp: , Rfl:    atorvastatin (LIPITOR) 40 MG tablet, TAKE 1 TABLET BY MOUTH DAILY AT 6 PM, Disp: 90 tablet, Rfl: 2   Cholecalciferol (VITAMIN D3) 2000 UNITS capsule, Take 2,000 Units by mouth daily. , Disp: , Rfl:    Lacosamide ([name]) 150 MG TABS, Take 1 tablet (150 mg total) by mouth 2 (two) times daily., Disp: 60 tablet, Rfl: 11   Magnesium 250 MG TABS, Take 250 mg by  mouth in the morning and at bedtime., Disp: , Rfl:    metoprolol tartrate (LOPRESSOR) 50 MG tablet, Take 1.5 tablets (75 mg total) by mouth 2 (two) times daily., Disp: 270 tablet, Rfl: 3   Multiple Vitamin (MULTIVITAMIN) capsule, Take 1 capsule by mouth daily., Disp: , Rfl:    telmisartan (MICARDIS) 80 MG tablet, TAKE 1 TABLET BY MOUTH EVERY DAY, Disp: 90 tablet, Rfl: 1   Allergies  Allergen Reactions   Sulfa Antibiotics Itching     Review of Systems  Constitutional: Negative.   Eyes:  Negative for blurred vision.  Respiratory: Negative.  Negative for shortness of breath.   Cardiovascular: Negative.  Negative for chest pain, palpitations and leg swelling.  Gastrointestinal: Negative.   Genitourinary:        Nocturia  Neurological: Negative.   Psychiatric/Behavioral: Negative.      Today's Vitals   12/20/21 1444  BP: 132/78  Pulse: 69  Temp: 98.3 F (36.8 C)  TempSrc: Oral  Weight: 170 lb 9.6 oz (77.4 kg)  Height: 5' 0.6" (1.539 m)   Body mass index is 32.66 kg/m.   Objective:  Physical Exam Vitals reviewed.  Constitutional:      General: She is not in acute distress.    Appearance: Normal appearance. She is well-developed and normal weight.  Eyes:     Pupils: Pupils are equal, round, and reactive to light.  Cardiovascular:     Rate and Rhythm: Normal rate and regular rhythm.     Pulses: Normal pulses.     Heart sounds: Normal heart sounds. No murmur heard. Pulmonary:     Effort: Pulmonary effort is normal. No respiratory distress.     Breath sounds: Normal breath sounds. No wheezing.  Skin:    General: Skin is warm and dry.     Capillary Refill: Capillary refill takes less than 2 seconds.  Neurological:     General: No focal deficit present.     Mental Status: She is alert and oriented to person, place, and time.     Cranial Nerves: No cranial nerve deficit.     Motor: No weakness.  Psychiatric:        Mood and Affect: Mood normal.        Behavior: Behavior  normal.        Thought Content: Thought content normal.        Judgment: Judgment normal.        Assessment And Plan:     1. Hypertensive heart and renal disease with renal failure, stage 1 through stage 4 or unspecified chronic kidney disease, without heart failure Comments: Blood pressure is well controlled, continue current medications  2. Type 2 diabetes mellitus with stage 3a chronic kidney disease, without long-term current use of insulin (HCC) Comments: HgbA1c has been stable. Continue current medications.  - Hemoglobin A1c  3. Nocturnal enuresis Comments: Had an episode one week ago,  will check for urinary tract infection. She is also awaiting an appt with Neurology  - POCT Urinalysis Dipstick (81002)  4. Other seizures [facility]) Comments: She is awaiting a call from Neurology  5. Immunization due Comments: Checked [facility] for Shingrix with $0 copay     Patient was given opportunity to ask questions. Patient verbalized understanding of the plan and was able to repeat key elements of the plan. All questions were answered to their satisfaction.  [name], FNP   I, [name], FNP, have reviewed all documentation for this visit. The documentation on 12/20/21 for the exam, diagnosis, procedures, and orders are all accurate and complete.   IF YOU HAVE BEEN REFERRED TO A SPECIALIST, IT MAY TAKE 1-2 WEEKS TO SCHEDULE/PROCESS THE REFERRAL. IF YOU HAVE NOT HEARD FROM US/SPECIALIST IN TWO WEEKS, PLEASE GIVE [place] A CALL AT [phone].   THE PATIENT IS ENCOURAGED TO PRACTICE SOCIAL DISTANCING DUE TO THE COVID-19 PANDEMIC.

## 2021-12-21 ENCOUNTER — Telehealth: Payer: Self-pay | Admitting: Adult Health

## 2021-12-21 ENCOUNTER — Encounter: Payer: Self-pay | Admitting: Nurse Practitioner

## 2021-12-21 NOTE — Telephone Encounter (Signed)
Phone rep did try calling pt on home # husband asked pt be called on cell#.  Phone rep called pt cell# to call received message that pt's vm is full.  Unable to schedule the f/u needed with Jinny Blossom, NP

## 2021-12-21 NOTE — Telephone Encounter (Signed)
Scheduled Pt appt on 04/14/22 at 10:00 am

## 2021-12-22 ENCOUNTER — Other Ambulatory Visit: Payer: Self-pay | Admitting: Internal Medicine

## 2021-12-23 ENCOUNTER — Encounter: Payer: Self-pay | Admitting: Nurse Practitioner

## 2021-12-24 ENCOUNTER — Encounter: Payer: Self-pay | Admitting: Neurology

## 2021-12-28 NOTE — Progress Notes (Signed)
[place] OFFICE PROGRESS NOTE  Sara [name], [address] [place] [address]  DIAGNOSIS: F/u of essential thrombocytosis  CURRENT THERAPY: -Hydroxyurea 500 mg daily, started in 2010., increased to hydrea '1000mg'$  daily on Monday and Thursday, and '500mg'$  daily for the rest of week in 01/2015 -Aspirin 81 mg daily.  -Switched Hydrea to anagrelide 1 mg daily 07/2016. Held and was on hydrea from 01/2019-02/2019 due to Anagrelide on back order. Increase Anagrelide to '1mg'$  daily in AM and on MWF add 0.'5mg'$  in the PM starting 01/03/20. Reduced to Anagrelide '1mg'$  daily from 01/28/21 (after hospitalization)  INTERVAL HISTORY: Sara [name] 66 y.o. female returns to the clinic today for a follow-up visit.  The patient was last seen in clinic on 09/02/2021.  The patient is currently being seen for essential thrombocytosis.  She presented alone. At her last appointment, Dr. Burr Medico recommended slightly increasing her dose of anagrelide.  The patient opted not to increase her dose.  Dr. Burr Medico discussed it was ok to proceed on the current dose as long as her platelet count is <550k. The patient is compliant with her anagrelide and takes it daily. She has been feeling fairly well since her last appointment.  She does have a history of seizures for which she follows with neurology and she did have a seizure in the interval.  She denies any headache, visual changes, numbness and tingling, confusion, chest discomfort, shortness of breath, nausea, weakness, or burning in hands and feet.  She denies any early satiety.  Denies any abnormal bleeding or bruising.  She is here today for evaluation and repeat blood work.   MEDICAL HISTORY: Past Medical History:  Diagnosis Date   Anxiety    Blood dyscrasia    Chronic kidney disease    Gout    Hyperlipemia    Hypertension    LVH (left ventricular hypertrophy)    12/21/09 Echo -mild asymmetric LVH,EF =>55%   Morbid obesity (HCC)     Myocardial infarction (HCC)    OSA (obstructive sleep apnea)    PAD (peripheral artery disease) (HCC)    Pneumonia    Pre-diabetes    Renal artery stenosis ([place]) 12/13/2010   PTCA and Stent - right   Seizures (HCC)    Syncopal episodes    Thrombocytosis     ALLERGIES:  is allergic to sulfa antibiotics.  MEDICATIONS:  Current Outpatient Medications  Medication Sig Dispense Refill   amLODipine (NORVASC) 2.5 MG tablet TAKE 1 TABLET(2.5 MG) BY MOUTH DAILY 90 tablet 1   anagrelide ([name]) 1 MG capsule Take 1 capsule (1 mg total) by mouth daily. 30 capsule 5   aspirin EC 81 MG tablet Take 81 mg by mouth daily. Swallow whole.     atorvastatin (LIPITOR) 40 MG tablet TAKE 1 TABLET BY MOUTH DAILY AT 6 PM 90 tablet 2   Cholecalciferol (VITAMIN D3) 2000 UNITS capsule Take 2,000 Units by mouth daily.      Lacosamide ([name]) 150 MG TABS Take 1 tablet (150 mg total) by mouth 2 (two) times daily. 60 tablet 11   Magnesium 250 MG TABS Take 250 mg by mouth in the morning and at bedtime.     metoprolol tartrate (LOPRESSOR) 50 MG tablet Take 1.5 tablets (75 mg total) by mouth 2 (two) times daily. 270 tablet 3   Multiple Vitamin (MULTIVITAMIN) capsule Take 1 capsule by mouth daily.     telmisartan (MICARDIS) 80 MG tablet TAKE 1 TABLET BY MOUTH EVERY  DAY 90 tablet 1   No current facility-administered medications for this visit.    SURGICAL HISTORY:  Past Surgical History:  Procedure Laterality Date   BREAST CYST EXCISION     S/P Benign Right Breast Cyst Removal.   BREAST REDUCTION SURGERY  1995   S/P Bilateral breast reduction   CESAREAN SECTION  1980, 1986   Times  Two.   CORONARY ARTERY BYPASS GRAFT N/A 04/21/2017   Procedure: CORONARY ARTERY BYPASS GRAFTING (CABG) x 2, USING LEFT MAMMARY ARTERY AND RIGHT GREATER SAPHENOUS VEIN HARVESTED ENDOSCOPICALLY;  Surgeon: [name], MD;  Location: [facility];  Service: Open Heart Surgery;  Laterality: N/A;   EXTREMITY CYST EXCISION  1993   left wrist    LEFT HEART CATH AND CORONARY ANGIOGRAPHY N/A 04/17/2017   Procedure: Left Heart Cath and Coronary Angiography;  Surgeon: Jettie Booze, MD;  Location: [facility];  Service: Cardiovascular;  Laterality: N/A;   PARTIAL HYSTERECTOMY  1994   renal artery stent placement Right    TEE WITHOUT CARDIOVERSION N/A 04/21/2017   Procedure: TRANSESOPHAGEAL ECHOCARDIOGRAM (TEE);  Surgeon: [name], MD;  Location: [facility];  Service: Open Heart Surgery;  Laterality: N/A;   VENTRAL HERNIA REPAIR N/A 06/17/2021   Procedure: OPEN VENTRAL HERNIA REPAIR WITH MESH;  Surgeon: Kinsinger, Arta Bruce, MD;  Location: [facility];  Service: General;  Laterality: N/A;    REVIEW OF SYSTEMS:   Review of Systems  Constitutional: Negative for appetite change, chills, fatigue, fever and unexpected weight change.  HENT: Negative for mouth sores, nosebleeds, sore throat and trouble swallowing.   Eyes: Negative for eye problems and icterus.  Respiratory: Negative for cough, hemoptysis, shortness of breath and wheezing.   Cardiovascular: Negative for chest pain and leg swelling.  Gastrointestinal: Negative for abdominal pain, constipation, diarrhea, nausea and vomiting.  Genitourinary: Negative for bladder incontinence, difficulty urinating, dysuria, frequency and hematuria.   Musculoskeletal: Negative for back pain, gait problem, neck pain and neck stiffness.  Skin: Negative for itching and rash.  Neurological: Negative for dizziness, extremity weakness, gait problem, headaches, light-headedness and seizures.  Hematological: Negative for adenopathy. Does not bruise/bleed easily.  Psychiatric/Behavioral: Negative for confusion, depression and sleep disturbance. The patient is not nervous/anxious.     PHYSICAL EXAMINATION:  Blood pressure (!) 156/75, pulse 70, temperature (!) 97.1 F (36.2 C), temperature source Temporal, resp. rate 18, height '5\' 6"'$  (1.676 m), weight 167 lb 11.2 oz (76.1 kg), SpO2 98 %.  ECOG  PERFORMANCE STATUS: 1  Physical Exam  Constitutional: Oriented to person, place, and time and well-developed, well-nourished, and in no distress.  HENT:  Head: Normocephalic and atraumatic.  Mouth/Throat: Oropharynx is clear and moist. No oropharyngeal exudate.  Eyes: Conjunctivae are normal. Right eye exhibits no discharge. Left eye exhibits no discharge. No scleral icterus.  Neck: Normal range of motion. Neck supple.  Cardiovascular: Normal rate, regular rhythm, systolic murmur noted and intact distal pulses.   Pulmonary/Chest: Effort normal and breath sounds normal. No respiratory distress. No wheezes. No rales.  Abdominal: Soft. Bowel sounds are normal. Exhibits no distension and no mass. There is no tenderness.  Musculoskeletal: Normal range of motion. Exhibits no edema.  Lymphadenopathy:    No cervical adenopathy.  Neurological: Alert and oriented to person, place, and time. Exhibits normal muscle tone. Gait normal. Coordination normal.  Skin: Skin is warm and dry. No rash noted. Not diaphoretic. No erythema. No pallor.  Psychiatric: Mood, memory and judgment normal.  Vitals reviewed.  LABORATORY DATA: Lab Results  Component Value Date   WBC 5.8 12/31/2021   HGB 14.5 12/31/2021   HCT 44.5 12/31/2021   MCV 86.6 12/31/2021   PLT 533 (H) 12/31/2021      Chemistry      Component Value Date/Time   NA 137 11/02/2021 1015   NA 138 02/04/2021 1449   NA 139 08/11/2017 1528   K 4.0 11/02/2021 1015   K 4.2 08/11/2017 1528   CL 102 11/02/2021 1015   CL 102 03/19/2013 1535   CO2 29 11/02/2021 1015   CO2 27 08/11/2017 1528   BUN 22 11/02/2021 1015   BUN 12 02/04/2021 1449   BUN 15.0 08/11/2017 1528   CREATININE 1.28 (H) 11/02/2021 1015   CREATININE 1.1 08/11/2017 1528   GLU 94 06/21/2018 0000      Component Value Date/Time   CALCIUM 9.9 11/02/2021 1015   CALCIUM 9.2 08/11/2017 1528   ALKPHOS 96 11/02/2021 1015   ALKPHOS 88 08/11/2017 1528   AST 29 11/02/2021 1015   AST  27 08/11/2017 1528   ALT 31 11/02/2021 1015   ALT 26 08/11/2017 1528   BILITOT 0.6 11/02/2021 1015   BILITOT 0.37 08/11/2017 1528       RADIOGRAPHIC STUDIES:  No results found.   ASSESSMENT/PLAN:  Sara [name] is a 66 y.o. female with    1. Essential Thrombocythemia. JAK2 (+)  -Diagnosed in 2010. JAK2 mutation was present. Her bone marrow aspirate and biopsy on 01/20/2009 which yielded a specimen that was suboptimal for evaluation. However, megakaryocytes were abundant with normal morphology. There was no clustering or excess blasts seen. Platelet count on 01/20/2009 was 881.  -She was on Hydrea previously, had episodes of dizziness and syncope, and she was concerned about the risk of leukemia, so it was stopped. The patient was being treated with aspirin and Anagrelide '1mg'$  in AM and on MWF add 0.'5mg'$  in the PM.  -She was off Anagrelide for 7 days in April 2022 due to significant bowl obstruction. She restarted Anagrelide at '1mg'$  daily only since discharge from 01/28/21. She is tolerating well.  -Labs reviewed, plt 533k today (12/31/21) from 463 two months ago. Dr. Burr Medico previous note discussed increasing her anagrelide dose slightly, however patient was very reluctant to make the change.  Given slightly increased platelet count, Dr. Ernestina Penna previous note mentions she is okay to continue Anagrelide '1mg'$  daily for now as long as her PLT count is <550k.  -With that said, we will keep her dose the same for now, however, the patient agreed to closer monitoring. I will make a lab appointment in 1, 2, and 4 months to monitor her platelets and dose adjust her anagrelide if necessary.  -F/U provider in 4 months    2. H/o of Seizure, Recurrent Syncope episodes -Her Syncope have unclear etiology. -She has a heart monitor.  -continue meds, follow up with her neurologist.    3. Diabetes, HTN, Arthritis, CAD -She has been seeing a nutritionist at church. -She monitors her sugar level regularly -She has  recent arthritis in her left knee.  -Coronary artery disease status post bypass surgery grafting x 2 by Dr. Cyndia Bent in June 2018. -Continue to f/u with cardiologist and PCP      PLAN: -Continue anagrelide 1 mg daily, will increase anagrelide dose slightly if platelet count more than 550K in future per Dr. Ernestina Penna last note.  -labs in 1 and 2 months -labs and F/u in 4 months  No orders of the defined types were placed in this encounter.    The total time spent in the appointment was 20-29 minutes.   Simisola Sandles L Anjeli Casad, PA-C 12/31/21

## 2021-12-31 ENCOUNTER — Other Ambulatory Visit: Payer: Self-pay

## 2021-12-31 ENCOUNTER — Inpatient Hospital Stay: Payer: Medicare PPO | Admitting: Physician Assistant

## 2021-12-31 ENCOUNTER — Inpatient Hospital Stay: Payer: Medicare PPO | Attending: Nurse Practitioner

## 2021-12-31 VITALS — BP 156/75 | HR 70 | Temp 97.1°F | Resp 18 | Ht 66.0 in | Wt 167.7 lb

## 2021-12-31 DIAGNOSIS — Z7982 Long term (current) use of aspirin: Secondary | ICD-10-CM | POA: Insufficient documentation

## 2021-12-31 DIAGNOSIS — D473 Essential (hemorrhagic) thrombocythemia: Secondary | ICD-10-CM | POA: Diagnosis not present

## 2021-12-31 DIAGNOSIS — D75839 Thrombocytosis, unspecified: Secondary | ICD-10-CM | POA: Insufficient documentation

## 2021-12-31 DIAGNOSIS — Z0000 Encounter for general adult medical examination without abnormal findings: Secondary | ICD-10-CM

## 2021-12-31 DIAGNOSIS — Z79899 Other long term (current) drug therapy: Secondary | ICD-10-CM | POA: Diagnosis not present

## 2021-12-31 LAB — CBC WITH DIFFERENTIAL/PLATELET
Abs Immature Granulocytes: 0.02 10*3/uL (ref 0.00–0.07)
Basophils Absolute: 0.1 10*3/uL (ref 0.0–0.1)
Basophils Relative: 1 %
Eosinophils Absolute: 0.3 10*3/uL (ref 0.0–0.5)
Eosinophils Relative: 5 %
HCT: 44.5 % (ref 36.0–46.0)
Hemoglobin: 14.5 g/dL (ref 12.0–15.0)
Immature Granulocytes: 0 %
Lymphocytes Relative: 21 %
Lymphs Abs: 1.2 10*3/uL (ref 0.7–4.0)
MCH: 28.2 pg (ref 26.0–34.0)
MCHC: 32.6 g/dL (ref 30.0–36.0)
MCV: 86.6 fL (ref 80.0–100.0)
Monocytes Absolute: 0.4 10*3/uL (ref 0.1–1.0)
Monocytes Relative: 7 %
Neutro Abs: 3.8 10*3/uL (ref 1.7–7.7)
Neutrophils Relative %: 66 %
Platelets: 533 10*3/uL — ABNORMAL HIGH (ref 150–400)
RBC: 5.14 MIL/uL — ABNORMAL HIGH (ref 3.87–5.11)
RDW: 17.1 % — ABNORMAL HIGH (ref 11.5–15.5)
WBC: 5.8 10*3/uL (ref 4.0–10.5)
nRBC: 0 % (ref 0.0–0.2)

## 2021-12-31 LAB — IRON AND IRON BINDING CAPACITY (CC-WL,HP ONLY)
Iron: 96 ug/dL (ref 28–170)
Saturation Ratios: 23 % (ref 10.4–31.8)
TIBC: 421 ug/dL (ref 250–450)
UIBC: 325 ug/dL (ref 148–442)

## 2021-12-31 MED ORDER — ANAGRELIDE HCL 1 MG PO CAPS: 1.0000 mg | ORAL_CAPSULE | Freq: Every day | ORAL | 5 refills | Status: DC

## 2022-01-02 ENCOUNTER — Encounter: Payer: Self-pay | Admitting: Cardiovascular Disease

## 2022-01-07 ENCOUNTER — Other Ambulatory Visit: Payer: Self-pay | Admitting: Nurse Practitioner

## 2022-01-07 DIAGNOSIS — I130 Hypertensive heart and chronic kidney disease with heart failure and stage 1 through stage 4 chronic kidney disease, or unspecified chronic kidney disease: Secondary | ICD-10-CM

## 2022-01-07 DIAGNOSIS — I10 Essential (primary) hypertension: Secondary | ICD-10-CM

## 2022-01-19 ENCOUNTER — Telehealth: Payer: Self-pay

## 2022-01-19 NOTE — Chronic Care Management (AMB) (Signed)
? ? ?  Chronic Care Management ?Pharmacy Assistant  ? ?Name: Sara [name]  MRN: [id number] DOB: 26-Mar-1956 ? ?Reason for Encounter: Disease State/ Diabetes ? ?Recent office visits:  ?12-20-2021 [name], [place]. A1C= 6.1. Abnormal UA. Patient reported not taking Ibuprofen and Oxycodone. Shingrix given. ? ?11-11-2021 Kellie Simmering, LPN. Annual wellness visit. PNEUMOCOCCAL vaccine given. ? ?Recent consult visits:  ?12-31-2021 Heilingoetter, Cassandra L, PA-C (Oncology). No changes. Follow up in 4 months. ? ?09-07-2021 Ward Givens, NP (Neurology). Referral placed to dentistry. ? ?09-02-2021 Truitt Merle, MD (Oncology). Continue anagrelide 1 mg daily, will increase anagrelide dose slightly if platelet count more than 550K in future. Follow up in 4 months. ? ?08-19-2021 Kinsinger, Arta Bruce, MD (General surgery). Post op visit for ventral hernia repair. ? ?08-18-2021 Star Age, MD (Neurology). Nocturnal polysomnography completed. ? ?Hospital visits:  ?None in previous 6 months ? ?Medications: ?Outpatient Encounter Medications as of 01/19/2022  ?Medication Sig  ? amLODipine (NORVASC) 2.5 MG tablet TAKE 1 TABLET(2.5 MG) BY MOUTH DAILY  ? anagrelide ([name]) 1 MG capsule Take 1 capsule (1 mg total) by mouth daily.  ? aspirin EC 81 MG tablet Take 81 mg by mouth daily. Swallow whole.  ? atorvastatin (LIPITOR) 40 MG tablet TAKE 1 TABLET BY MOUTH DAILY AT 6 PM  ? Cholecalciferol (VITAMIN D3) 2000 UNITS capsule Take 2,000 Units by mouth daily.   ? Lacosamide ([name]) 150 MG TABS Take 1 tablet (150 mg total) by mouth 2 (two) times daily.  ? Magnesium 250 MG TABS Take 250 mg by mouth in the morning and at bedtime.  ? metoprolol tartrate (LOPRESSOR) 50 MG tablet Take 1.5 tablets (75 mg total) by mouth 2 (two) times daily.  ? Multiple Vitamin (MULTIVITAMIN) capsule Take 1 capsule by mouth daily.  ? telmisartan (MICARDIS) 80 MG tablet TAKE 1 TABLET BY MOUTH EVERY DAY  ? ?No facility-administered encounter medications on  file as of 01/19/2022.  ? ?Recent Relevant Labs: ?Lab Results  ?Component Value Date/Time  ? HGBA1C 6.1 (H) 12/20/2021 03:23 PM  ? HGBA1C 6.1 (H) 06/17/2021 01:12 PM  ? MICROALBUR 10 11/04/2020 05:22 PM  ? MICROALBUR 80 03/13/2019 12:10 PM  ?  ?Kidney Function ?Lab Results  ?Component Value Date/Time  ? CREATININE 1.28 (H) 11/02/2021 10:15 AM  ? CREATININE 1.34 (H) 06/19/2021 04:00 AM  ? CREATININE 1.28 (H) 06/17/2021 01:12 PM  ? CREATININE 1.1 08/11/2017 03:28 PM  ? CREATININE 1.3 (H) 02/09/2017 01:52 PM  ? GFRNONAA 46 (L) 11/02/2021 10:15 AM  ? GFRAA 49 (L) 11/16/2020 12:09 PM  ? ? ?01-19-2022: 1st attempt left message with husband. ?01-21-2022: 2nd attempt left VM ? ?Care Gaps: ?Covid booster overdue ?Yearly Ophthalmology exam overdue ?AWV 11-17-2022 ? ?Star Rating Drugs: ?Atorvastatin 40 mg- Last filled 12-23-2021 90 DS [facility] ?Telmisartan 80 mg- Last filled 12-22-2021 90 DS [facility] ? ?Malecca Hicks CMA ?Clinical Pharmacist Assistant ?[phone] ? ?

## 2022-01-20 ENCOUNTER — Encounter: Payer: Self-pay | Admitting: Hematology

## 2022-01-28 ENCOUNTER — Inpatient Hospital Stay: Payer: Medicare PPO | Attending: Nurse Practitioner

## 2022-01-28 ENCOUNTER — Encounter: Payer: Self-pay | Admitting: Hematology

## 2022-01-28 ENCOUNTER — Other Ambulatory Visit: Payer: Self-pay

## 2022-01-28 DIAGNOSIS — D473 Essential (hemorrhagic) thrombocythemia: Secondary | ICD-10-CM | POA: Diagnosis not present

## 2022-01-28 DIAGNOSIS — Z0000 Encounter for general adult medical examination without abnormal findings: Secondary | ICD-10-CM

## 2022-01-28 LAB — CBC WITH DIFFERENTIAL/PLATELET
Abs Immature Granulocytes: 0.03 10*3/uL (ref 0.00–0.07)
Basophils Absolute: 0.1 10*3/uL (ref 0.0–0.1)
Basophils Relative: 1 %
Eosinophils Absolute: 0.1 10*3/uL (ref 0.0–0.5)
Eosinophils Relative: 1 %
HCT: 44.9 % (ref 36.0–46.0)
Hemoglobin: 15 g/dL (ref 12.0–15.0)
Immature Granulocytes: 1 %
Lymphocytes Relative: 17 %
Lymphs Abs: 1.1 10*3/uL (ref 0.7–4.0)
MCH: 29.1 pg (ref 26.0–34.0)
MCHC: 33.4 g/dL (ref 30.0–36.0)
MCV: 87.2 fL (ref 80.0–100.0)
Monocytes Absolute: 0.4 10*3/uL (ref 0.1–1.0)
Monocytes Relative: 6 %
Neutro Abs: 4.8 10*3/uL (ref 1.7–7.7)
Neutrophils Relative %: 74 %
Platelets: 469 10*3/uL — ABNORMAL HIGH (ref 150–400)
RBC: 5.15 MIL/uL — ABNORMAL HIGH (ref 3.87–5.11)
RDW: 15.8 % — ABNORMAL HIGH (ref 11.5–15.5)
Smear Review: NORMAL
WBC: 6.5 10*3/uL (ref 4.0–10.5)
nRBC: 0 % (ref 0.0–0.2)

## 2022-01-28 LAB — IRON AND IRON BINDING CAPACITY (CC-WL,HP ONLY)
Iron: 105 ug/dL (ref 28–170)
Saturation Ratios: 24 % (ref 10.4–31.8)
TIBC: 434 ug/dL (ref 250–450)
UIBC: 329 ug/dL (ref 148–442)

## 2022-02-02 DIAGNOSIS — F331 Major depressive disorder, recurrent, moderate: Secondary | ICD-10-CM | POA: Diagnosis not present

## 2022-02-02 DIAGNOSIS — F411 Generalized anxiety disorder: Secondary | ICD-10-CM | POA: Diagnosis not present

## 2022-02-08 ENCOUNTER — Telehealth: Payer: Self-pay

## 2022-02-08 NOTE — Chronic Care Management (AMB) (Signed)
? ? ?  Chronic Care Management ?Pharmacy Assistant  ? ?Name: Sara [name]  MRN: [id number] DOB: 10/02/1956 ? ?Reason for Encounter: Disease State/ Diabetes ? ?Recent office visits:  ?12-20-2021 [name], [place]. A1C= 6.1. Abnormal UA. Patient reported not taking Ibuprofen and Oxycodone. Shingrix given. ?  ?11-11-2021 Kellie Simmering, LPN. Annual wellness visit. PNEUMOCOCCAL vaccine given. ? ?Recent consult visits:  ?12-31-2021 Heilingoetter, Cassandra L, PA-C (Oncology). No changes. Follow up in 4 months. ?  ?09-07-2021 Ward Givens, NP (Neurology). Referral placed to dentistry. ?  ?09-02-2021 Truitt Merle, MD (Oncology). Continue anagrelide 1 mg daily, will increase anagrelide dose slightly if platelet count more than 550K in future. Follow up in 4 months. ?  ?08-19-2021 Kinsinger, Arta Bruce, MD (General surgery). Post op visit for ventral hernia repair. ?  ?08-18-2021 Star Age, MD (Neurology). Nocturnal polysomnography completed. ? ?Hospital visits:  ?None in previous 6 months ? ?Medications: ?Outpatient Encounter Medications as of 02/08/2022  ?Medication Sig  ? amLODipine (NORVASC) 2.5 MG tablet TAKE 1 TABLET(2.5 MG) BY MOUTH DAILY  ? anagrelide ([name]) 1 MG capsule Take 1 capsule (1 mg total) by mouth daily.  ? aspirin EC 81 MG tablet Take 81 mg by mouth daily. Swallow whole.  ? atorvastatin (LIPITOR) 40 MG tablet TAKE 1 TABLET BY MOUTH DAILY AT 6 PM  ? Cholecalciferol (VITAMIN D3) 2000 UNITS capsule Take 2,000 Units by mouth daily.   ? Lacosamide ([name]) 150 MG TABS Take 1 tablet (150 mg total) by mouth 2 (two) times daily.  ? Magnesium 250 MG TABS Take 250 mg by mouth in the morning and at bedtime.  ? metoprolol tartrate (LOPRESSOR) 50 MG tablet Take 1.5 tablets (75 mg total) by mouth 2 (two) times daily.  ? Multiple Vitamin (MULTIVITAMIN) capsule Take 1 capsule by mouth daily.  ? telmisartan (MICARDIS) 80 MG tablet TAKE 1 TABLET BY MOUTH EVERY DAY  ? ?No facility-administered encounter medications  on file as of 02/08/2022.  ? ?Recent Relevant Labs: ?Lab Results  ?Component Value Date/Time  ? HGBA1C 6.1 (H) 12/20/2021 03:23 PM  ? HGBA1C 6.1 (H) 06/17/2021 01:12 PM  ? MICROALBUR 10 11/04/2020 05:22 PM  ? MICROALBUR 80 03/13/2019 12:10 PM  ?  ?Kidney Function ?Lab Results  ?Component Value Date/Time  ? CREATININE 1.28 (H) 11/02/2021 10:15 AM  ? CREATININE 1.34 (H) 06/19/2021 04:00 AM  ? CREATININE 1.28 (H) 06/17/2021 01:12 PM  ? CREATININE 1.1 08/11/2017 03:28 PM  ? CREATININE 1.3 (H) 02/09/2017 01:52 PM  ? GFRNONAA 46 (L) 11/02/2021 10:15 AM  ? GFRAA 49 (L) 11/16/2020 12:09 PM  ? ? ? ?02-08-2022: 1st attempt left VM ?02-09-2022: 2nd attempt no VM ?02-14-2022: 3rd attempt no VM ? ?Care Gaps: ?Covid booster overdue ?Yearly Ophthalmology exam overdue ?AWV 11-17-2022 ? ?Star Rating Drugs: ?Telmisartan 80 mg- Last filled 12-22-2021 90 DS [facility] ?Atorvastatin 40 mg- Last filled 12-23-2021 90 DS [facility] ? ?Malecca Hicks CMA ?Clinical Pharmacist Assistant ?[phone] ? ?

## 2022-02-16 ENCOUNTER — Encounter: Payer: Self-pay | Admitting: Adult Health

## 2022-02-16 ENCOUNTER — Encounter: Payer: Self-pay | Admitting: Neurology

## 2022-02-25 ENCOUNTER — Inpatient Hospital Stay: Payer: Medicare PPO | Attending: Nurse Practitioner

## 2022-02-25 DIAGNOSIS — D473 Essential (hemorrhagic) thrombocythemia: Secondary | ICD-10-CM | POA: Insufficient documentation

## 2022-03-04 ENCOUNTER — Inpatient Hospital Stay: Payer: Medicare PPO

## 2022-03-04 ENCOUNTER — Other Ambulatory Visit: Payer: Medicare PPO

## 2022-03-04 ENCOUNTER — Other Ambulatory Visit: Payer: Self-pay

## 2022-03-04 DIAGNOSIS — D473 Essential (hemorrhagic) thrombocythemia: Secondary | ICD-10-CM | POA: Diagnosis not present

## 2022-03-04 DIAGNOSIS — Z0000 Encounter for general adult medical examination without abnormal findings: Secondary | ICD-10-CM

## 2022-03-04 LAB — CMP (CANCER CENTER ONLY)
ALT: 29 U/L (ref 0–44)
AST: 20 U/L (ref 15–41)
Albumin: 3.7 g/dL (ref 3.5–5.0)
Alkaline Phosphatase: 77 U/L (ref 38–126)
Anion gap: 3 — ABNORMAL LOW (ref 5–15)
BUN: 25 mg/dL — ABNORMAL HIGH (ref 8–23)
CO2: 31 mmol/L (ref 22–32)
Calcium: 9.6 mg/dL (ref 8.9–10.3)
Chloride: 105 mmol/L (ref 98–111)
Creatinine: 1.26 mg/dL — ABNORMAL HIGH (ref 0.44–1.00)
GFR, Estimated: 47 mL/min — ABNORMAL LOW (ref >60)
Glucose, Bld: 96 mg/dL (ref 70–99)
Potassium: 4.5 mmol/L (ref 3.5–5.1)
Sodium: 139 mmol/L (ref 135–145)
Total Bilirubin: 0.5 mg/dL (ref 0.3–1.2)
Total Protein: 6.5 g/dL (ref 6.5–8.1)

## 2022-03-04 LAB — CBC WITH DIFFERENTIAL/PLATELET
Abs Immature Granulocytes: 0.02 10*3/uL (ref 0.00–0.07)
Basophils Absolute: 0.1 10*3/uL (ref 0.0–0.1)
Basophils Relative: 1 %
Eosinophils Absolute: 0.3 10*3/uL (ref 0.0–0.5)
Eosinophils Relative: 4 %
HCT: 41 % (ref 36.0–46.0)
Hemoglobin: 13.9 g/dL (ref 12.0–15.0)
Immature Granulocytes: 0 %
Lymphocytes Relative: 22 %
Lymphs Abs: 1.6 10*3/uL (ref 0.7–4.0)
MCH: 29.9 pg (ref 26.0–34.0)
MCHC: 33.9 g/dL (ref 30.0–36.0)
MCV: 88.2 fL (ref 80.0–100.0)
Monocytes Absolute: 0.5 10*3/uL (ref 0.1–1.0)
Monocytes Relative: 7 %
Neutro Abs: 4.6 10*3/uL (ref 1.7–7.7)
Neutrophils Relative %: 66 %
Platelets: 458 10*3/uL — ABNORMAL HIGH (ref 150–400)
RBC: 4.65 MIL/uL (ref 3.87–5.11)
RDW: 14.4 % (ref 11.5–15.5)
WBC: 7.1 10*3/uL (ref 4.0–10.5)
nRBC: 0 % (ref 0.0–0.2)

## 2022-03-04 LAB — IRON AND IRON BINDING CAPACITY (CC-WL,HP ONLY)
Iron: 99 ug/dL (ref 28–170)
Saturation Ratios: 27 % (ref 10.4–31.8)
TIBC: 374 ug/dL (ref 250–450)
UIBC: 275 ug/dL (ref 148–442)

## 2022-03-08 ENCOUNTER — Ambulatory Visit: Payer: HMO | Admitting: Adult Health

## 2022-03-15 ENCOUNTER — Encounter: Payer: Self-pay | Admitting: Adult Health

## 2022-03-15 ENCOUNTER — Ambulatory Visit (INDEPENDENT_AMBULATORY_CARE_PROVIDER_SITE_OTHER): Payer: Medicare PPO | Admitting: Adult Health

## 2022-03-15 VITALS — BP 167/84 | HR 61 | Ht 61.0 in | Wt 168.6 lb

## 2022-03-15 DIAGNOSIS — Z9989 Dependence on other enabling machines and devices: Secondary | ICD-10-CM | POA: Diagnosis not present

## 2022-03-15 DIAGNOSIS — R569 Unspecified convulsions: Secondary | ICD-10-CM

## 2022-03-15 DIAGNOSIS — G4733 Obstructive sleep apnea (adult) (pediatric): Secondary | ICD-10-CM | POA: Diagnosis not present

## 2022-03-15 NOTE — Patient Instructions (Signed)
Your Plan:  Continue vimpat 150 mg twice a day If your symptoms worsen or you develop new symptoms please let us know.   Thank you for coming to see [place] at [facility]. I hope we have been able to provide you high quality care today.  You may receive a patient satisfaction survey over the next few weeks. We would appreciate your feedback and comments so that we may continue to improve ourselves and the health of our patients.

## 2022-03-15 NOTE — Progress Notes (Signed)
PATIENT: Sara [name] DOB: 1956/09/01  REASON FOR VISIT: follow up HISTORY FROM: patient  Chief Complaint  Patient presents with   Follow-up    Pt in 4 pt is here for sleep apnea  and seizure follow up. Pt has dental device pt states she does ok with dental device . Pt sates she has not had a seizure since last appointment. Pt states when she is stressed  it triggers her seziures     HISTORY OF PRESENT ILLNESS: Today 03/15/22:  Sara [name] is a 66 year old female with a history of seizures and obstructive sleep apnea.  She returns today for follow-up.  Seizures: Reports that she has not had any additional events that she knows of since she started [name] 150 mg twice a day.  Tolerating the medication well. Driving a motor vehicle. Able to do everything for herself.   Obstructive sleep apnea: Patient had repeat testing that showed mild sleep apnea.  She now using a dental device. Feels that it helps but not as good as the cpap. Following with Dr. Ron Parker.   She returns today for follow-up  04/29/21: Sara [name] is a 66 year old female with a history of seizures and obstructive sleep apnea.  She returns today for follow-up.  She states that she had another "attack" in December or January.  The attacks always consist of her making sounds during her sleep.  She called her primary care provider.  Dr. Baird Cancer suggested that she take her sugar or have her husband take her sugar when she is making the sounds.  The patient states that typically when she wakes up she feels fine.  She continues to take [name] 100 mg in the morning and '150mg'$  at bedtime.  She denies missing any medication.  She returns today for an evaluation.  07/22/20:Sara [name] is a 66 year old female with a history of seizures and obstructive sleep apnea.  She returns today for follow-up.  Since her [name] was increased she reports that her husband has not reported any nocturnal events.  Overall she feels that she has been doing  well.  Her CPAP download indicates that she use her machine nightly for compliance of 100%.  She used her machine greater than 4 hours each night.  On average she uses her machine 6 hours and 36 minutes.  Her residual AHI is 1.6 on 9 cm of water with EPR of 2.  Leak in the 95th percentile is 42 L/min.  She states that she does not feel the machine leaking at night.  Overall she feels that she is doing well with the CPAP  HISTORY 03/02/20  Sara [name] is a 66 year old female with history of seizures and obstructive sleep apnea on CPAP.  Her seizures are typically characterized as staring off.  There was question of possible nocturnal seizure last year when she was taking Ozempic, question possible nocturnal seizure described as chewing around 2 AM.  [name] level  was normal, she remained on [name] 100 mg twice daily.  She is overall doing well, has reported 1 or 2 episodes her husband describes as, her making a loud noise, he couldn't wake her, when wearing CPAP, he went back to sleep. She has been trying to not eat before bed.  Recent A1c was 6.3.  She reports increase in stress.  She says when her husband notices these episodes, it it when she she is really tired, and not well rested.  She really does not want to go up on [name],  she requires brand-name.  She presents today for evaluation unaccompanied.   Checked CPAP for equipment function, used for more than 4 hours, 29 out of 30 days, 97%, average usage (days used) 6 hours 44 minutes, set pressure 9 cmH2O, leak in the 95th percentile was 48.5, AHI 2.8.    REVIEW OF SYSTEMS: Out of a complete 14 system review of symptoms, the patient complains only of the following symptoms, and all other reviewed systems are negative.   ALLERGIES: Allergies  Allergen Reactions   Sulfa Antibiotics Itching    HOME MEDICATIONS: Outpatient Medications Prior to Visit  Medication Sig Dispense Refill   amLODipine (NORVASC) 2.5 MG tablet TAKE 1 TABLET(2.5 MG) BY  MOUTH DAILY 90 tablet 1   anagrelide ([name]) 1 MG capsule Take 1 capsule (1 mg total) by mouth daily. 30 capsule 5   aspirin EC 81 MG tablet Take 81 mg by mouth daily. Swallow whole.     atorvastatin (LIPITOR) 40 MG tablet TAKE 1 TABLET BY MOUTH DAILY AT 6 PM 90 tablet 2   Cholecalciferol (VITAMIN D3) 2000 UNITS capsule Take 2,000 Units by mouth daily.      Lacosamide ([name]) 150 MG TABS Take 1 tablet (150 mg total) by mouth 2 (two) times daily. 60 tablet 11   Magnesium 250 MG TABS Take 250 mg by mouth in the morning and at bedtime.     metoprolol tartrate (LOPRESSOR) 50 MG tablet Take 1.5 tablets (75 mg total) by mouth 2 (two) times daily. 270 tablet 3   Multiple Vitamin (MULTIVITAMIN) capsule Take 1 capsule by mouth daily.     telmisartan (MICARDIS) 80 MG tablet TAKE 1 TABLET BY MOUTH EVERY DAY 90 tablet 1   No facility-administered medications prior to visit.    PAST MEDICAL HISTORY: Past Medical History:  Diagnosis Date   Anxiety    Blood dyscrasia    Chronic kidney disease    Gout    Hyperlipemia    Hypertension    LVH (left ventricular hypertrophy)    12/21/09 Echo -mild asymmetric LVH,EF =>55%   Morbid obesity (HCC)    Myocardial infarction (HCC)    OSA (obstructive sleep apnea)    PAD (peripheral artery disease) ([place])    Pneumonia    Pre-diabetes    Renal artery stenosis ([place]) 12/13/2010   PTCA and Stent - right   Seizures (HCC)    Syncopal episodes    Thrombocytosis     PAST SURGICAL HISTORY: Past Surgical History:  Procedure Laterality Date   BREAST CYST EXCISION     S/P Benign Right Breast Cyst Removal.   BREAST REDUCTION SURGERY  1995   S/P Bilateral breast reduction   [place], 1986   Times  Two.   CORONARY ARTERY BYPASS GRAFT N/A 04/21/2017   Procedure: CORONARY ARTERY BYPASS GRAFTING (CABG) x 2, USING LEFT MAMMARY ARTERY AND RIGHT GREATER SAPHENOUS VEIN HARVESTED ENDOSCOPICALLY;  Surgeon: Sara Pollack, MD;  Location: [place];  Service:  Open Heart Surgery;  Laterality: N/A;   EXTREMITY CYST EXCISION  1993   left wrist   LEFT HEART CATH AND CORONARY ANGIOGRAPHY N/A 04/17/2017   Procedure: Left Heart Cath and Coronary Angiography;  Surgeon: Jettie Booze, MD;  Location: [place] [facility];  Service: Cardiovascular;  Laterality: N/A;   PARTIAL HYSTERECTOMY  1994   renal artery stent placement Right    TEE WITHOUT CARDIOVERSION N/A 04/21/2017   Procedure: TRANSESOPHAGEAL ECHOCARDIOGRAM (TEE);  Surgeon: Sara Pollack, MD;  [facility];  Service: Open Heart Surgery;  Laterality: N/A;   VENTRAL HERNIA REPAIR N/A 06/17/2021   Procedure: OPEN VENTRAL HERNIA REPAIR WITH MESH;  Surgeon: Kinsinger, Arta Bruce, MD;  Location: [facility];  Service: General;  Laterality: N/A;    FAMILY HISTORY: Family History  Problem Relation Age of Onset   Heart failure Mother    Diabetes Father    Diabetes Sister    Hypertension Sister    Hypertension Sister    Hypertension Sister    Breast cancer Maternal Aunt        2 maternal aunts had breast cancer.   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    Employer: [employer]  Tobacco Use   Smoking status: Never   Smokeless tobacco: Never  Vaping Use   Vaping Use: Never used  Substance and Sexual Activity   Alcohol use: No    Alcohol/week: 0.0 standard drinks   Drug use: No   Sexual activity: Yes  Other Topics Concern   Not on file  Social History Narrative   Patient lives at husband and her son, home with family.   Patient has two adult children.   Patient is not drinking any caffeine.   Patient is working full-time.   Patient has a college education.   Patient is right-handed.   Social  Determinants of Health   Financial Resource Strain: Low Risk    Difficulty of Paying Living Expenses: Not hard at all  Food Insecurity: No Food Insecurity   Worried About Charity fundraiser in the Last Year: Never true   [place] in the Last Year: Never true  Transportation Needs: No Transportation Needs   Lack of Transportation (Medical): No   Lack of Transportation (Non-Medical): No  Physical Activity: Insufficiently Active   Days of Exercise per Week: 3 days   Minutes of Exercise per Session: 30 min  Stress: Stress Concern Present   Feeling of Stress : To some extent  Social Connections: Not on file  Intimate Partner Violence: Not on file      PHYSICAL EXAM  Vitals:   03/15/22 0734  BP: (!) 167/84  Pulse: 61  Weight: 168 lb 9.6 oz (76.5 kg)  Height: '5\' 1"'$  (1.549 m)   Body mass index is 31.86 kg/m.  Generalized: Well developed, in no acute distress   Neurological examination  Mentation: Alert oriented to time, place, history taking. Follows all commands speech and language fluent Cranial nerve II-XII: Pupils were equal round reactive to light. Extraocular movements were full, visual field were full on confrontational test.. Head turning and shoulder shrug  were normal and symmetric. Motor: The motor testing reveals 5 over 5 strength of all 4 extremities. Good symmetric motor tone is noted throughout.  Sensory: Sensory testing is intact to soft touch on all 4 extremities. No evidence of extinction is noted.  Coordination: Cerebellar testing reveals good finger-nose-finger and heel-to-shin bilaterally.  Gait and station: Gait is normal.   Reflexes: Deep tendon reflexes are symmetric and normal bilaterally.   DIAGNOSTIC DATA (LABS, IMAGING, TESTING) - I reviewed patient records, labs, notes, testing and imaging myself where available.  Lab Results  Component Value Date   WBC 7.1 03/04/2022   HGB 13.9 03/04/2022   HCT 41.0 03/04/2022   MCV 88.2 03/04/2022    PLT 458 (H) 03/04/2022      Component Value Date/Time   NA 139 03/04/2022 1447   NA 138 02/04/2021 1449   NA 139 08/11/2017 1528   K 4.5 03/04/2022 1447   K 4.2 08/11/2017 1528   CL 105 03/04/2022 1447   CL 102 03/19/2013 1535   CO2 31 03/04/2022 1447   CO2 27 08/11/2017 1528   GLUCOSE 96 03/04/2022 1447   GLUCOSE 93 08/11/2017 1528   GLUCOSE 117 (H) 03/19/2013 1535   BUN 25 (H) 03/04/2022 1447   BUN 12 02/04/2021 1449   BUN 15.0 08/11/2017 1528   CREATININE 1.26 (H) 03/04/2022 1447   CREATININE 1.1 08/11/2017 1528   CALCIUM 9.6 03/04/2022 1447   CALCIUM 9.2 08/11/2017 1528   PROT 6.5 03/04/2022 1447   PROT 7.0 01/14/2021 1521   PROT 7.0 08/11/2017 1528   ALBUMIN 3.7 03/04/2022 1447   ALBUMIN 4.3 01/14/2021 1521   ALBUMIN 3.6 08/11/2017 1528   AST 20 03/04/2022 1447   AST 27 08/11/2017 1528   ALT 29 03/04/2022 1447   ALT 26 08/11/2017 1528   ALKPHOS 77 03/04/2022 1447   ALKPHOS 88 08/11/2017 1528   BILITOT 0.5 03/04/2022 1447   BILITOT 0.37 08/11/2017 1528   GFRNONAA 47 (L) 03/04/2022 1447   GFRAA 49 (L) 11/16/2020 1209   Lab Results  Component Value Date   CHOL 111 11/04/2020   HDL 47 11/04/2020   LDLCALC 54 11/04/2020   TRIG 39 11/04/2020   CHOLHDL 2.4 11/04/2020   Lab Results  Component Value Date   HGBA1C 6.1 (H) 12/20/2021   Lab Results  Component Value Date   UMPNTIRW43 154 03/13/2019   Lab Results  Component Value Date   TSH 0.845 01/22/2021      ASSESSMENT AND PLAN 67 y.o. year old female  has a past medical history of Anxiety, Blood dyscrasia, Chronic kidney disease, Gout, Hyperlipemia, Hypertension, LVH (left ventricular hypertrophy), Morbid obesity ([place]), Myocardial infarction ([place]), OSA (obstructive sleep apnea), PAD (peripheral artery disease) ([place]), Pneumonia, Pre-diabetes, Renal artery stenosis ([place]) (12/13/2010), Seizures ([place]), Syncopal episodes, and Thrombocytosis. here with:   1.  Seizures  Continue [name] to 150 mg twice a  day Advised if she has any seizure event she should let us know  2.  Obstructive sleep apnea  Continue using dental device  Follow-up in 6 months or sooner if needed    Ward Givens, MSN, NP-C 03/15/2022, 8:19 AM [facility] [address] [place], [place] [address] [phone]

## 2022-03-22 ENCOUNTER — Other Ambulatory Visit: Payer: Self-pay | Admitting: Internal Medicine

## 2022-04-07 ENCOUNTER — Encounter: Payer: Self-pay | Admitting: Cardiovascular Disease

## 2022-04-07 ENCOUNTER — Encounter: Payer: Self-pay | Admitting: Adult Health

## 2022-04-08 ENCOUNTER — Other Ambulatory Visit: Payer: Self-pay | Admitting: *Deleted

## 2022-04-08 MED ORDER — ATORVASTATIN CALCIUM 40 MG PO TABS: 40.0000 mg | ORAL_TABLET | Freq: Every day | ORAL | 0 refills | Status: DC

## 2022-04-08 NOTE — Telephone Encounter (Signed)
Refills has been sent to the pharmacy. 

## 2022-04-13 DIAGNOSIS — F4322 Adjustment disorder with anxiety: Secondary | ICD-10-CM | POA: Diagnosis not present

## 2022-04-13 DIAGNOSIS — F331 Major depressive disorder, recurrent, moderate: Secondary | ICD-10-CM | POA: Diagnosis not present

## 2022-04-14 ENCOUNTER — Ambulatory Visit: Payer: Medicare PPO | Admitting: Adult Health

## 2022-04-29 ENCOUNTER — Inpatient Hospital Stay: Payer: Medicare PPO | Attending: Nurse Practitioner

## 2022-04-29 ENCOUNTER — Inpatient Hospital Stay (HOSPITAL_BASED_OUTPATIENT_CLINIC_OR_DEPARTMENT_OTHER): Payer: Medicare PPO | Admitting: Hematology

## 2022-04-29 ENCOUNTER — Encounter: Payer: Self-pay | Admitting: Cardiovascular Disease

## 2022-04-29 ENCOUNTER — Encounter: Payer: Self-pay | Admitting: Hematology

## 2022-04-29 ENCOUNTER — Other Ambulatory Visit: Payer: Self-pay

## 2022-04-29 VITALS — BP 157/85 | HR 62 | Temp 98.1°F | Resp 18 | Ht 61.0 in | Wt 164.9 lb

## 2022-04-29 DIAGNOSIS — I10 Essential (primary) hypertension: Secondary | ICD-10-CM | POA: Insufficient documentation

## 2022-04-29 DIAGNOSIS — I2510 Atherosclerotic heart disease of native coronary artery without angina pectoris: Secondary | ICD-10-CM | POA: Insufficient documentation

## 2022-04-29 DIAGNOSIS — D473 Essential (hemorrhagic) thrombocythemia: Secondary | ICD-10-CM

## 2022-04-29 DIAGNOSIS — G4733 Obstructive sleep apnea (adult) (pediatric): Secondary | ICD-10-CM | POA: Insufficient documentation

## 2022-04-29 DIAGNOSIS — Z658 Other specified problems related to psychosocial circumstances: Secondary | ICD-10-CM | POA: Diagnosis not present

## 2022-04-29 DIAGNOSIS — M1712 Unilateral primary osteoarthritis, left knee: Secondary | ICD-10-CM | POA: Insufficient documentation

## 2022-04-29 DIAGNOSIS — D75839 Thrombocytosis, unspecified: Secondary | ICD-10-CM | POA: Insufficient documentation

## 2022-04-29 DIAGNOSIS — Z79899 Other long term (current) drug therapy: Secondary | ICD-10-CM | POA: Insufficient documentation

## 2022-04-29 DIAGNOSIS — Z882 Allergy status to sulfonamides status: Secondary | ICD-10-CM | POA: Diagnosis not present

## 2022-04-29 DIAGNOSIS — E119 Type 2 diabetes mellitus without complications: Secondary | ICD-10-CM | POA: Diagnosis not present

## 2022-04-29 DIAGNOSIS — Z0000 Encounter for general adult medical examination without abnormal findings: Secondary | ICD-10-CM

## 2022-04-29 LAB — CBC WITH DIFFERENTIAL/PLATELET
Abs Immature Granulocytes: 0.02 10*3/uL (ref 0.00–0.07)
Basophils Absolute: 0.1 10*3/uL (ref 0.0–0.1)
Basophils Relative: 1 %
Eosinophils Absolute: 0.2 10*3/uL (ref 0.0–0.5)
Eosinophils Relative: 3 %
HCT: 41.1 % (ref 36.0–46.0)
Hemoglobin: 14.1 g/dL (ref 12.0–15.0)
Immature Granulocytes: 0 %
Lymphocytes Relative: 24 %
Lymphs Abs: 1.5 10*3/uL (ref 0.7–4.0)
MCH: 30.5 pg (ref 26.0–34.0)
MCHC: 34.3 g/dL (ref 30.0–36.0)
MCV: 89 fL (ref 80.0–100.0)
Monocytes Absolute: 0.5 10*3/uL (ref 0.1–1.0)
Monocytes Relative: 8 %
Neutro Abs: 3.9 10*3/uL (ref 1.7–7.7)
Neutrophils Relative %: 64 %
Platelets: 468 10*3/uL — ABNORMAL HIGH (ref 150–400)
RBC: 4.62 MIL/uL (ref 3.87–5.11)
RDW: 13.7 % (ref 11.5–15.5)
WBC: 6.1 10*3/uL (ref 4.0–10.5)
nRBC: 0 % (ref 0.0–0.2)

## 2022-04-29 LAB — IRON AND IRON BINDING CAPACITY (CC-WL,HP ONLY)
Iron: 94 ug/dL (ref 28–170)
Saturation Ratios: 27 % (ref 10.4–31.8)
TIBC: 351 ug/dL (ref 250–450)
UIBC: 257 ug/dL (ref 148–442)

## 2022-04-29 NOTE — Progress Notes (Addendum)
Sara [name]   [phone] [phone]   Clinic Follow up Note   Patient Care Team: [name], MD as PCP - General (Internal Medicine) Croitoru, Dani Gobble, MD as PCP - Cardiology (Cardiology) Mayford Knife, [facility] (Pharmacist) Lynne Logan, RN as [place] Management  Date of Service:  04/29/2022  CHIEF COMPLAINT: f/u of essential thrombocythemia  CURRENT THERAPY:  Anagrelide '1mg'$  daily, since 01/28/21  ASSESSMENT & PLAN:  Sara [name] is a 66 y.o. female with   1. Essential Thrombocythemia, JAK2 (+)  -diagnosed in 2010, JAK2 mutation present. Bone marrow aspirate and biopsy on 01/20/09 was non-diagnostic. However, megakaryocytes were abundant with normal morphology. There was no clustering or excess blasts seen. Platelet count on 01/20/09 was 881k.  -started Hydrea in 2010, dose adjusted as needed. Switched to anagrelide in 07/2016 due to concern about risk of leukemia. She is currently on '1mg'$  daily, since 01/28/21, tolerating well with no noticeable side effects. -she is clinically doing very well, aside from some social stressors. Labs reviewed, plt stable at 468k. Given good response, we will repeat lab in 3 and 6 months. -She is due for cardiology follow-up this month, I encouraged her to call for appointment.  We again reviewed the potential cardiotoxicity from anagrelide, I recommend her to have a repeat echo (last in 2018)    2. H/o of Seizure, Recurrent Syncope episodes -Her Syncope have unclear etiology. -She has a heart monitor.  -continue meds, follow up with her neurologist.    3. Diabetes, HTN, Arthritis, CAD -She has been seeing a nutritionist at church. -She monitors her sugar level regularly -She has recent arthritis in her left knee.  -Coronary artery disease status post bypass surgery grafting x 2 by Dr. Cyndia Bent in June 2018. -Continue to f/u with cardiologist and PCP      PLAN: -Continue anagrelide 1 mg  daily -lab in 3 and 6 months -f/u in 6 months   No problem-specific Assessment & Plan notes found for this encounter.   INTERVAL HISTORY:  Sara [name] is here for a follow up of essential thrombocythemia. She was last seen by PA Cassie on 12/31/21. She presents to the clinic alone. She reports she has a lot of social stressors that are affecting her health. She denies any side effects from the anagrelide.   All other systems were reviewed with the patient and are negative.  MEDICAL HISTORY:  Past Medical History:  Diagnosis Date   Anxiety    Blood dyscrasia    Chronic kidney disease    Gout    Hyperlipemia    Hypertension    LVH (left ventricular hypertrophy)    12/21/09 Echo -mild asymmetric LVH,EF =>55%   Morbid obesity (HCC)    Myocardial infarction (HCC)    OSA (obstructive sleep apnea)    PAD (peripheral artery disease) ([place])    Pneumonia    Pre-diabetes    Renal artery stenosis ([place]) 12/13/2010   PTCA and Stent - right   Seizures (HCC)    Syncopal episodes    Thrombocytosis     SURGICAL HISTORY: Past Surgical History:  Procedure Laterality Date   BREAST CYST EXCISION     S/P Benign Right Breast Cyst Removal.   BREAST REDUCTION SURGERY  1995   S/P Bilateral breast reduction   [place], 1986   Times  Two.   CORONARY ARTERY BYPASS GRAFT N/A 04/21/2017   Procedure: CORONARY ARTERY BYPASS GRAFTING (CABG) x  2, USING LEFT MAMMARY ARTERY AND RIGHT GREATER SAPHENOUS VEIN HARVESTED ENDOSCOPICALLY;  Surgeon: [name], MD;  Location: [facility];  Service: Open Heart Surgery;  Laterality: N/A;   EXTREMITY CYST EXCISION  1993   left wrist   LEFT HEART CATH AND CORONARY ANGIOGRAPHY N/A 04/17/2017   Procedure: Left Heart Cath and Coronary Angiography;  Surgeon: Jettie Booze, MD;  Location: [place] [facility];  Service: Cardiovascular;  Laterality: N/A;   PARTIAL HYSTERECTOMY  1994   renal artery stent placement Right    TEE WITHOUT CARDIOVERSION  N/A 04/21/2017   Procedure: TRANSESOPHAGEAL ECHOCARDIOGRAM (TEE);  Surgeon: [name], MD;  Location: [facility];  Service: Open Heart Surgery;  Laterality: N/A;   VENTRAL HERNIA REPAIR N/A 06/17/2021   Procedure: OPEN VENTRAL HERNIA REPAIR WITH MESH;  Surgeon: Kinsinger, Arta Bruce, MD;  [facility];  Service: General;  Laterality: N/A;    I have reviewed the social history and family history with the patient and they are unchanged from previous note.  ALLERGIES:  is allergic to sulfa antibiotics.  MEDICATIONS:  Current Outpatient Medications  Medication Sig Dispense Refill   amLODipine (NORVASC) 2.5 MG tablet TAKE 1 TABLET(2.5 MG) BY MOUTH DAILY 90 tablet 1   anagrelide ([name]) 1 MG capsule Take 1 capsule (1 mg total) by mouth daily. 30 capsule 5   aspirin EC 81 MG tablet Take 81 mg by mouth daily. Swallow whole.     atorvastatin (LIPITOR) 40 MG tablet Take 1 tablet (40 mg total) by mouth daily. NEED OV. 90 tablet 0   Cholecalciferol (VITAMIN D3) 2000 UNITS capsule Take 2,000 Units by mouth daily.      Lacosamide ([name]) 150 MG TABS Take 1 tablet (150 mg total) by mouth 2 (two) times daily. 60 tablet 11   Magnesium 250 MG TABS Take 250 mg by mouth in the morning and at bedtime.     metoprolol tartrate (LOPRESSOR) 50 MG tablet Take 1.5 tablets (75 mg total) by mouth 2 (two) times daily. 270 tablet 3   Multiple Vitamin (MULTIVITAMIN) capsule Take 1 capsule by mouth daily.     telmisartan (MICARDIS) 80 MG tablet TAKE 1 TABLET BY MOUTH EVERY DAY 90 tablet 1   No current facility-administered medications for this visit.    PHYSICAL EXAMINATION: ECOG PERFORMANCE STATUS: 1 - Symptomatic but completely ambulatory  Vitals:   04/29/22 1301  BP: (!) 157/85  Pulse: 62  Resp: 18  Temp: 98.1 F (36.7 C)  SpO2: 97%   Wt Readings from Last 3 Encounters:  04/29/22 164 lb 14.4 oz (74.8 kg)  03/15/22 168 lb 9.6 oz (76.5 kg)  12/31/21 167 lb 11.2 oz (76.1 kg)     GENERAL:alert, no  distress and comfortable SKIN: skin color normal, no rashes or significant lesions EYES: normal, Conjunctiva are pink and non-injected, sclera clear  NEURO: alert & oriented x 3 with fluent speech  LABORATORY DATA:  I have reviewed the data as listed    Latest Ref Rng & Units 04/29/2022   12:40 PM 03/04/2022    2:47 PM 01/28/2022   12:36 PM  CBC  WBC 4.0 - 10.5 K/uL 6.1  7.1  6.5   Hemoglobin 12.0 - 15.0 g/dL 14.1  13.9  15.0   Hematocrit 36.0 - 46.0 % 41.1  41.0  44.9   Platelets 150 - 400 K/uL 468  458  469         Latest Ref Rng & Units 03/04/2022  2:47 PM 11/02/2021   10:15 AM 06/19/2021    4:00 AM  CMP  Glucose 70 - 99 mg/dL 96  77  99   BUN 8 - 23 mg/dL '25  22  16   '$ Creatinine 0.44 - 1.00 mg/dL 1.26  1.28  1.34   Sodium 135 - 145 mmol/L 139  137  129   Potassium 3.5 - 5.1 mmol/L 4.5  4.0  4.2   Chloride 98 - 111 mmol/L 105  102  97   CO2 22 - 32 mmol/L '31  29  24   '$ Calcium 8.9 - 10.3 mg/dL 9.6  9.9  9.4   Total Protein 6.5 - 8.1 g/dL 6.5  6.9  6.3   Total Bilirubin 0.3 - 1.2 mg/dL 0.5  0.6  0.8   Alkaline Phos 38 - 126 U/L 77  96  70   AST 15 - 41 U/L '20  29  29   '$ ALT 0 - 44 U/L '29  31  22       '$ RADIOGRAPHIC STUDIES: I have personally reviewed the radiological images as listed and agreed with the findings in the report. No results found.    No orders of the defined types were placed in this encounter.  All questions were answered. The patient knows to call the clinic with any problems, questions or concerns. No barriers to learning was detected. The total time spent in the appointment was 20 minutes.     Truitt Merle, MD 04/29/2022   I, Wilburn Mylar, am acting as scribe for Truitt Merle, MD.   I have reviewed the above documentation for accuracy and completeness, and I agree with the above.

## 2022-04-30 ENCOUNTER — Other Ambulatory Visit: Payer: Self-pay | Admitting: Cardiology

## 2022-04-30 LAB — GLUCOSE, POCT (MANUAL RESULT ENTRY): POC Glucose: 293 mg/dl — AB (ref 70–99)

## 2022-05-01 ENCOUNTER — Encounter: Payer: Self-pay | Admitting: Internal Medicine

## 2022-05-04 DIAGNOSIS — F331 Major depressive disorder, recurrent, moderate: Secondary | ICD-10-CM | POA: Diagnosis not present

## 2022-05-05 LAB — LIPID PANEL W/O CHOL/HDL RATIO

## 2022-05-05 LAB — HGB A1C W/O EAG: Hgb A1c MFr Bld: 5.9 % — ABNORMAL HIGH (ref 4.8–5.6)

## 2022-05-19 ENCOUNTER — Ambulatory Visit: Payer: Medicare PPO | Admitting: Cardiovascular Disease

## 2022-05-19 ENCOUNTER — Encounter: Payer: Self-pay | Admitting: Cardiovascular Disease

## 2022-05-19 VITALS — BP 144/88 | HR 77 | Ht 61.0 in | Wt 164.7 lb

## 2022-05-19 DIAGNOSIS — G4733 Obstructive sleep apnea (adult) (pediatric): Secondary | ICD-10-CM | POA: Diagnosis not present

## 2022-05-19 DIAGNOSIS — I10 Essential (primary) hypertension: Secondary | ICD-10-CM | POA: Diagnosis not present

## 2022-05-19 DIAGNOSIS — E1122 Type 2 diabetes mellitus with diabetic chronic kidney disease: Secondary | ICD-10-CM

## 2022-05-19 DIAGNOSIS — I5032 Chronic diastolic (congestive) heart failure: Secondary | ICD-10-CM

## 2022-05-19 DIAGNOSIS — I2510 Atherosclerotic heart disease of native coronary artery without angina pectoris: Secondary | ICD-10-CM

## 2022-05-19 DIAGNOSIS — Z8679 Personal history of other diseases of the circulatory system: Secondary | ICD-10-CM

## 2022-05-19 DIAGNOSIS — N183 Chronic kidney disease, stage 3 unspecified: Secondary | ICD-10-CM

## 2022-05-19 DIAGNOSIS — N1831 Chronic kidney disease, stage 3a: Secondary | ICD-10-CM

## 2022-05-19 DIAGNOSIS — D473 Essential (hemorrhagic) thrombocythemia: Secondary | ICD-10-CM

## 2022-05-19 DIAGNOSIS — E7800 Pure hypercholesterolemia, unspecified: Secondary | ICD-10-CM

## 2022-05-19 DIAGNOSIS — E669 Obesity, unspecified: Secondary | ICD-10-CM

## 2022-05-19 MED ORDER — METOPROLOL TARTRATE 100 MG PO TABS: 100.0000 mg | ORAL_TABLET | Freq: Two times a day (BID) | ORAL | 3 refills | Status: DC

## 2022-05-19 NOTE — Progress Notes (Signed)
Cardiology Office Note   Date:  05/20/2022   ID:  [name], DOB [birth year], MRN [id number] PCP:  [name], MD  Cardiologist:  Lavender Stanke Electrophysiologist:  None   Evaluation Performed:  Follow-Up Visit  Chief Complaint:  CAD  History of Present Illness:    Sara [name] is a 66 y.o. female with history of artery disease (NSTEMI April 15, 2017, LIMA to LAD and SVG to diagonal bypass surgery on April 21, 2017).  Mild ischemic cardiomyopathy (EF 45-50%), peripheral arterial disease (renal artery stenosis with stent 2012), history of morbid obesity, diabetes mellitus, hyperlipidemia, hypertension, obstructive sleep apnea on CPAP, essential thrombocythemia and absence seizures, returning for routine follow-up.  She is doing quite well from a cardiovascular point of view.  She has not had any problems with chest pain, shortness of breath, palpitations, syncope, edema, orthopnea/PND, claudication or new focal neurological events.  She has not had any recent seizures.  She remains under a lot of emotional stress at home due to the unpredictable behavior and mood swings of her 41 year old son who has paranoid schizophrenia.  He has not been physically violent but is often verbally abusive and becomes easily agitated.  Her blood pressure has been high, typically in the 150-160 range.  She will occasionally have a systolic in the 732K.  She has been steadily losing weight and her BMI has decreased from greater than 40 to only 31.  Metabolic parameters are markedly improved.  Her most recent hemoglobin A1c was 5.7% (without medications),  LDL was 54 and HDL was 47.  She was hospitalized 04/01 - 01/28/2021 with a lethargy, nausea vomiting and diarrhea and transient acute kidney injury.  She had a small bowel obstruction that resolved with conservative management.  She has had episodes of loss of consciousness that are consistent with absence seizures.  She had normal vital signs when she  was unresponsive.  She is very busy taking care of her sister who had this a disabling stroke, her son who has paranoid schizophrenia and her husband who has lumbar spine disease and limited mobility.  She has retired from [employer] .  Past Medical History:  Diagnosis Date   Anxiety    Blood dyscrasia    Chronic kidney disease    Gout    Hyperlipemia    Hypertension    LVH (left ventricular hypertrophy)    12/21/09 Echo -mild asymmetric LVH,EF =>55%   Morbid obesity (HCC)    Myocardial infarction (HCC)    OSA (obstructive sleep apnea)    PAD (peripheral artery disease) ([place])    Pneumonia    Pre-diabetes    Renal artery stenosis ([place]) 12/13/2010   PTCA and Stent - right   Seizures (HCC)    Syncopal episodes    Thrombocytosis    Past Surgical History:  Procedure Laterality Date   BREAST CYST EXCISION     S/P Benign Right Breast Cyst Removal.   BREAST REDUCTION SURGERY  1995   S/P Bilateral breast reduction   [place], 1986   Times  Two.   CORONARY ARTERY BYPASS GRAFT N/A 04/21/2017   Procedure: CORONARY ARTERY BYPASS GRAFTING (CABG) x 2, USING LEFT MAMMARY ARTERY AND RIGHT GREATER SAPHENOUS VEIN HARVESTED ENDOSCOPICALLY;  Surgeon: [name], MD;  Location: [place] [facility];  Service: Open Heart Surgery;  Laterality: N/A;   EXTREMITY CYST EXCISION  1993   left wrist   LEFT HEART CATH AND CORONARY ANGIOGRAPHY N/A 04/17/2017  Procedure: Left Heart Cath and Coronary Angiography;  Surgeon: Jettie Booze, MD;  Location: [facility];  Service: Cardiovascular;  Laterality: N/A;   PARTIAL HYSTERECTOMY  1994   renal artery stent placement Right    TEE WITHOUT CARDIOVERSION N/A 04/21/2017   Procedure: TRANSESOPHAGEAL ECHOCARDIOGRAM (TEE);  Surgeon: [name], MD;  Location: [facility];  Service: Open Heart Surgery;  Laterality: N/A;   VENTRAL HERNIA REPAIR N/A 06/17/2021   Procedure: OPEN VENTRAL HERNIA REPAIR WITH MESH;  Surgeon: Kinsinger, Arta Bruce, MD;  Location: [facility];  Service: General;  Laterality: N/A;     Current Meds  Medication Sig   amLODipine (NORVASC) 2.5 MG tablet TAKE 1 TABLET(2.5 MG) BY MOUTH DAILY   anagrelide ([name]) 1 MG capsule Take 1 capsule (1 mg total) by mouth daily.   aspirin EC 81 MG tablet Take 81 mg by mouth daily. Swallow whole.   atorvastatin (LIPITOR) 40 MG tablet Take 1 tablet (40 mg total) by mouth daily. NEED OV.   Cholecalciferol (VITAMIN D3) 2000 UNITS capsule Take 2,000 Units by mouth daily.    Lacosamide ([name]) 150 MG TABS Take 1 tablet (150 mg total) by mouth 2 (two) times daily.   Magnesium 250 MG TABS Take 250 mg by mouth daily.   Multiple Vitamin (MULTIVITAMIN) capsule Take 1 capsule by mouth daily.   telmisartan (MICARDIS) 80 MG tablet TAKE 1 TABLET BY MOUTH EVERY DAY   [DISCONTINUED] metoprolol tartrate (LOPRESSOR) 50 MG tablet Take 1.5 tablets (75 mg total) by mouth 2 (two) times daily.     Allergies:   Sulfa antibiotics   Social History   Tobacco Use   Smoking status: Never   Smokeless tobacco: Never  Vaping Use   Vaping Use: Never used  Substance Use Topics   Alcohol use: No    Alcohol/week: 0.0 standard drinks of alcohol   Drug use: No     Family Hx: The patient's family history includes Breast cancer in her maternal aunt and maternal grandmother; Diabetes in her father and sister; Heart failure in her mother; Hypertension in her sister, sister, and sister. There is no history of Sleep apnea.  ROS:   Please see the history of present illness.     All other systems reviewed and are negative.   Prior CV studies:   The following studies were reviewed today:  Labs/Other Tests and Data Reviewed:    EKG: ECG ordered today and personally reviewed.  It shows normal sinus rhythm with borderline first-degree AV block (PR 210 ms) and a chronic unchanged QS pattern in leads V1-V2 without ischemic repolarization abnormalities.  QTc 414 ms.  Recent Labs: 06/19/2021:  Magnesium 1.8 03/04/2022: ALT 29; BUN 25; Creatinine 1.26; Potassium 4.5; Sodium 139 04/29/2022: Hemoglobin 14.1; Platelets 468   Recent Lipid Panel Lab Results  Component Value Date/Time   CHOL CANCELED 04/30/2022 10:53 AM   TRIG CANCELED 04/30/2022 10:53 AM   HDL CANCELED 04/30/2022 10:53 AM   CHOLHDL 2.4 11/04/2020 04:28 PM   CHOLHDL 2.7 04/17/2017 10:00 AM   LDLCALC 54 11/04/2020 04:28 PM    Wt Readings from Last 3 Encounters:  05/19/22 164 lb 11.2 oz (74.7 kg)  04/29/22 164 lb 14.4 oz (74.8 kg)  03/15/22 168 lb 9.6 oz (76.5 kg)     Objective:    Vital Signs:  BP (!) 144/88   Pulse 77   Ht '5\' 1"'$  (1.549 m)   Wt 164 lb 11.2 oz (74.7 kg)   SpO2  97%   BMI 31.12 kg/m     General: Alert, oriented x3, no distress, borderline obese Head: no evidence of trauma, PERRL, EOMI, no exophtalmos or lid lag, no myxedema, no xanthelasma; normal ears, nose and oropharynx Neck: normal jugular venous pulsations and no hepatojugular reflux; brisk carotid pulses without delay and no carotid bruits Chest: clear to auscultation, no signs of consolidation by percussion or palpation, normal fremitus, symmetrical and full respiratory excursions Cardiovascular: normal position and quality of the apical impulse, regular rhythm, normal first and second heart sounds, no murmurs, rubs or gallops Abdomen: no tenderness or distention, no masses by palpation, no abnormal pulsatility or arterial bruits, normal bowel sounds, no hepatosplenomegaly Extremities: no clubbing, cyanosis or edema; 2+ radial, ulnar and brachial pulses bilaterally; 2+ right femoral, posterior tibial and dorsalis pedis pulses; 2+ left femoral, posterior tibial and dorsalis pedis pulses; no subclavian or femoral bruits Neurological: grossly nonfocal Psych: Normal mood and affect    ASSESSMENT & PLAN:    1. Coronary artery disease involving native coronary artery of native heart without angina pectoris   2. Chronic diastolic  congestive heart failure ([place])   3. Essential hypertension   4. OSA (obstructive sleep apnea)   5. Controlled type 2 diabetes mellitus with stage 3 chronic kidney disease, without long-term current use of insulin ([place])   6. Hypercholesterolemia   7. Mild obesity   8. History of renal artery stenosis   9. Stage 3a chronic kidney disease ([place])   10. Essential thrombocythemia ([place])       CAD s/p CABG: She is quite physically active and does not have angina pectoris.  On aspirin, statin, beta-blocker. CHF: NYHA functional class I and clinically euvolemic without the use of loop diuretics.  Has not had any manifestations of heart failure since bypass surgery.  At this juncture, I do not think SGLT2 inhibitors are warranted. HTN: Blood pressure remains elevated despite lifestyle changes and weight loss.  Some of this may be due to the constant hyperadrenergic drive related to her situation at home.  May respond best to beta-blockers.  Increase metoprolol to 100 mg twice daily. OSA: Denies daytime hypersomnolence.  Compliant with CPAP.  With substantial weight loss her sleep apnea may even be "cured". DM: Hemoglobin A1c is now normal range without medications, following weight loss and improve diet. HLP: All lipid parameters are in target range.  Continue statin. Obesity: Doing an excellent job with weight loss.  Encouraged target BMI of less than 30 before the next office appointment. Hx of renal artery stent: On maximum dose telmisartan and I think we will be able to control her blood pressure with medications.  Creatinine is stable, actually improved to 1.26 (was 1.67 in April 2022). CKD 3a-3b: Baseline creatinine seems to be around 1.3 (GFR 45).  Essential thrombocythemia:   Most recent platelet count was borderline elevated at 468K.  Continue anagrelide and hydroxyurea.   Medication Adjustments/Labs and Tests Ordered: Current medicines are reviewed at length with the patient today.  Concerns  regarding medicines are outlined above.   Tests Ordered: Orders Placed This Encounter  Procedures   EKG 12-Lead     Medication Changes: Meds ordered this encounter  Medications   metoprolol tartrate (LOPRESSOR) 100 MG tablet    Sig: Take 1 tablet (100 mg total) by mouth 2 (two) times daily.    Dispense:  180 tablet    Refill:  3    Patient Instructions  Medication Instructions:  INCREASE the Metoprolol Tartrate  to 100 mg twice daily  *If you need a refill on your cardiac medications before your next appointment, please call your pharmacy*   Lab Work: None ordered If you have labs (blood work) drawn today and your tests are completely normal, you will receive your results only by: [place] (if you have MyChart) OR A paper copy in the mail If you have any lab test that is abnormal or we need to change your treatment, we will call you to review the results.   Testing/Procedures: None ordered   Follow-Up: At [facility], you and your health needs are our priority.  As part of our continuing mission to provide you with exceptional heart care, we have created designated Provider Care Teams.  These Care Teams include your primary Cardiologist (physician) and Advanced Practice Providers (APPs -  Physician Assistants and Nurse Practitioners) who all work together to provide you with the care you need, when you need it.  We recommend signing up for the patient portal called "MyChart".  Sign up information is provided on this After Visit Summary.  MyChart is used to connect with patients for Virtual Visits (Telemedicine).  Patients are able to view lab/test results, encounter notes, upcoming appointments, etc.  Non-urgent messages can be sent to your provider as well.   To learn more about what you can do with MyChart, go to [email or web address].    Your next appointment:   12 month(s)  The format for your next appointment:   In Person  Provider:   Sanda Klein,  MD {  Important Information About Sugar       HYPERTENSION CONTROL Vitals:   05/19/22 0934 05/19/22 1006  BP: (!) 144/84 (!) 144/88    The patient's blood pressure is elevated above target today.  The following intervention was performed to address the patient's elevated BP:  - A current anti-hypertensive medication was adjusted today.       Follow Up:  Virtual Visit or In Person  1 year  Signed, Sanda Klein, MD  05/20/2022 6:35 PM    [place]

## 2022-05-19 NOTE — Patient Instructions (Addendum)
Medication Instructions:  INCREASE the Metoprolol Tartrate to 100 mg twice daily  *If you need a refill on your cardiac medications before your next appointment, please call your pharmacy*   Lab Work: None ordered If you have labs (blood work) drawn today and your tests are completely normal, you will receive your results only by: [place] (if you have MyChart) OR A paper copy in the mail If you have any lab test that is abnormal or we need to change your treatment, we will call you to review the results.   Testing/Procedures: None ordered   Follow-Up: At [facility], you and your health needs are our priority.  As part of our continuing mission to provide you with exceptional heart care, we have created designated Provider Care Teams.  These Care Teams include your primary Cardiologist (physician) and Advanced Practice Providers (APPs -  Physician Assistants and Nurse Practitioners) who all work together to provide you with the care you need, when you need it.  We recommend signing up for the patient portal called "MyChart".  Sign up information is provided on this After Visit Summary.  MyChart is used to connect with patients for Virtual Visits (Telemedicine).  Patients are able to view lab/test results, encounter notes, upcoming appointments, etc.  Non-urgent messages can be sent to your provider as well.   To learn more about what you can do with MyChart, go to [email or web address].    Your next appointment:   12 month(s)  The format for your next appointment:   In Person  Provider:   Sanda Klein, MD {  Important Information About Sugar

## 2022-05-20 DIAGNOSIS — Z1231 Encounter for screening mammogram for malignant neoplasm of breast: Secondary | ICD-10-CM | POA: Diagnosis not present

## 2022-05-20 LAB — HM MAMMOGRAPHY: HM Mammogram: ABNORMAL — AB (ref 0–4)

## 2022-05-25 ENCOUNTER — Telehealth: Payer: Self-pay

## 2022-05-25 NOTE — Telephone Encounter (Signed)
Called patient via to give lab results from free screening.  Hemoglobin A1C: 5.9   Lipid Panel- Cancelled  Patient voiced understanding.  Patient is under the care of PCP [name], Cardiologist and Oncologist. Patient is scheduled for FU w/ PCP on 06/01/22.

## 2022-05-26 ENCOUNTER — Other Ambulatory Visit: Payer: Self-pay | Admitting: Neurology

## 2022-05-26 DIAGNOSIS — R569 Unspecified convulsions: Secondary | ICD-10-CM

## 2022-06-01 ENCOUNTER — Encounter: Payer: Self-pay | Admitting: Nurse Practitioner

## 2022-06-01 ENCOUNTER — Ambulatory Visit (INDEPENDENT_AMBULATORY_CARE_PROVIDER_SITE_OTHER): Payer: Medicare PPO | Admitting: Nurse Practitioner

## 2022-06-01 VITALS — BP 128/70 | HR 69 | Temp 98.3°F | Ht 61.0 in | Wt 161.0 lb

## 2022-06-01 DIAGNOSIS — Z0000 Encounter for general adult medical examination without abnormal findings: Secondary | ICD-10-CM

## 2022-06-01 DIAGNOSIS — E1122 Type 2 diabetes mellitus with diabetic chronic kidney disease: Secondary | ICD-10-CM | POA: Diagnosis not present

## 2022-06-01 DIAGNOSIS — I1310 Hypertensive heart and chronic kidney disease without heart failure, with stage 1 through stage 4 chronic kidney disease, or unspecified chronic kidney disease: Secondary | ICD-10-CM | POA: Diagnosis not present

## 2022-06-01 DIAGNOSIS — I701 Atherosclerosis of renal artery: Secondary | ICD-10-CM

## 2022-06-01 DIAGNOSIS — N1831 Chronic kidney disease, stage 3a: Secondary | ICD-10-CM | POA: Diagnosis not present

## 2022-06-01 DIAGNOSIS — Z1211 Encounter for screening for malignant neoplasm of colon: Secondary | ICD-10-CM | POA: Diagnosis not present

## 2022-06-01 DIAGNOSIS — E6609 Other obesity due to excess calories: Secondary | ICD-10-CM

## 2022-06-01 DIAGNOSIS — Z23 Encounter for immunization: Secondary | ICD-10-CM

## 2022-06-01 DIAGNOSIS — Z6830 Body mass index (BMI) 30.0-30.9, adult: Secondary | ICD-10-CM | POA: Diagnosis not present

## 2022-06-01 DIAGNOSIS — Z8679 Personal history of other diseases of the circulatory system: Secondary | ICD-10-CM | POA: Diagnosis not present

## 2022-06-01 MED ORDER — TELMISARTAN 80 MG PO TABS: 80.0000 mg | ORAL_TABLET | Freq: Every day | ORAL | 1 refills | Status: DC

## 2022-06-01 NOTE — Patient Instructions (Signed)

## 2022-06-01 NOTE — Progress Notes (Signed)
Sara [name],Sara [name],acting as a Education administrator for [employer], FNP.,have documented all relevant documentation on the behalf of [name], FNP,as directed by  [name], FNP while in the presence of [name], [place]. Subjective:     Patient ID: Sara [name] , female    DOB: [birth year] , 66 y.o.   MRN: [id number]   Chief Complaint  Patient presents with   Annual Exam    HPI  Patient presents today for HM. She has an appt with Nephrology next week.   Wt Readings from Last 3 Encounters: 06/01/22 : 161 lb (73 kg) 05/19/22 : 164 lb 11.2 oz (74.7 kg) 04/29/22 : 164 lb 14.4 oz (74.8 kg)  Prior to taking ozempic she was weighing 225 lbs. No longer taking Ozempic      Past Medical History:  Diagnosis Date   Anxiety    Blood dyscrasia    Chronic kidney disease    Gout    Hyperlipemia    Hypertension    LVH (left ventricular hypertrophy)    12/21/09 Echo -mild asymmetric LVH,EF =>55%   Morbid obesity (HCC)    Myocardial infarction (HCC)    OSA (obstructive sleep apnea)    PAD (peripheral artery disease) (HCC)    Pneumonia    Pre-diabetes    Renal artery stenosis ([place]) 12/13/2010   PTCA and Stent - right   Seizures (HCC)    Syncopal episodes    Thrombocytosis      Family History  Problem Relation Age of Onset   Heart failure Mother    Diabetes Father    Diabetes Sister    Hypertension Sister    Hypertension Sister    Hypertension Sister    Breast cancer Maternal Aunt        2 maternal aunts had breast cancer.   Breast cancer Maternal Grandmother    Sleep apnea Neg Hx      Current Outpatient Medications:    escitalopram (LEXAPRO) 10 MG tablet, Take 10 mg by mouth as needed., Disp: , Rfl:    amLODipine (NORVASC) 2.5 MG tablet, TAKE 1 TABLET(2.5 MG) BY MOUTH DAILY, Disp: 90 tablet, Rfl: 1   anagrelide ([name]) 1 MG capsule, Take 1 capsule (1 mg total) by mouth daily., Disp: 30 capsule, Rfl: 5   aspirin EC 81 MG tablet, Take 81 mg by mouth daily. Swallow whole.,  Disp: , Rfl:    atorvastatin (LIPITOR) 40 MG tablet, Take 1 tablet (40 mg total) by mouth daily. NEED OV., Disp: 90 tablet, Rfl: 0   Cholecalciferol (VITAMIN D3) 2000 UNITS capsule, Take 2,000 Units by mouth daily. , Disp: , Rfl:    Lacosamide 150 MG TABS, TAKE 1 TABLET(150 MG) BY MOUTH TWICE DAILY, Disp: 60 tablet, Rfl: 5   Magnesium 250 MG TABS, Take 250 mg by mouth daily., Disp: , Rfl:    metoprolol tartrate (LOPRESSOR) 100 MG tablet, Take 1 tablet (100 mg total) by mouth 2 (two) times daily., Disp: 180 tablet, Rfl: 3   Multiple Vitamin (MULTIVITAMIN) capsule, Take 1 capsule by mouth daily., Disp: , Rfl:    telmisartan (MICARDIS) 80 MG tablet, Take 1 tablet (80 mg total) by mouth daily., Disp: 90 tablet, Rfl: 1   Allergies  Allergen Reactions   Sulfa Antibiotics Itching      The patient states she is status post hysterectomy. Negative for Dysmenorrhea and Negative for Menorrhagia. Negative for: breast discharge, breast lump(s), breast pain and breast self exam. Associated symptoms include abnormal vaginal bleeding. Pertinent negatives  include abnormal bleeding (hematology), anxiety, decreased libido, depression, difficulty falling sleep, dyspareunia, history of infertility, nocturia, sexual dysfunction, sleep disturbances, urinary incontinence, urinary urgency, vaginal discharge and vaginal itching. Diet regular; she cut back on her portions and stopped eating at a certain time of the day and drinking adequate amounts of water.  The patient states her exercise level is minimal with walking, also has stairs in her house so she tries to stay on the move.   The patient's tobacco use is:  Social History   Tobacco Use  Smoking Status Never  Smokeless Tobacco Never   She has been exposed to passive smoke. The patient's alcohol use is:  Social History   Substance and Sexual Activity  Alcohol Use No   Alcohol/week: 0.0 standard drinks of alcohol   Review of Systems  Constitutional:  Negative.   HENT: Negative.    Eyes: Negative.   Respiratory: Negative.    Cardiovascular: Negative.   Gastrointestinal: Negative.   Endocrine: Negative.   Genitourinary: Negative.   Musculoskeletal: Negative.   Skin: Negative.   Allergic/Immunologic: Negative.   Neurological: Negative.   Hematological: Negative.   Psychiatric/Behavioral: Negative.       Today's Vitals   06/01/22 1411  BP: 128/70  Pulse: 69  Temp: 98.3 F (36.8 C)  TempSrc: Oral  Weight: 161 lb (73 kg)  Height: '5\' 1"'$  (1.549 m)   Body mass index is 30.42 kg/m.  Wt Readings from Last 3 Encounters:  06/01/22 161 lb (73 kg)  05/19/22 164 lb 11.2 oz (74.7 kg)  04/29/22 164 lb 14.4 oz (74.8 kg)    Objective:  Physical Exam Vitals reviewed.  Constitutional:      General: She is not in acute distress.    Appearance: Normal appearance. She is well-developed and normal weight.  Eyes:     Pupils: Pupils are equal, round, and reactive to light.  Cardiovascular:     Rate and Rhythm: Normal rate and regular rhythm.     Pulses: Normal pulses.     Heart sounds: Normal heart sounds. No murmur heard. Pulmonary:     Effort: Pulmonary effort is normal. No respiratory distress.     Breath sounds: Normal breath sounds. No wheezing.  Chest:     Chest wall: No mass.  Breasts:    Tanner Score is 5.     Breasts are symmetrical.     Right: Normal.     Left: Normal.  Lymphadenopathy:     Upper Body:     Right upper body: No supraclavicular, axillary or pectoral adenopathy.     Left upper body: No supraclavicular, axillary or pectoral adenopathy.  Skin:    General: Skin is warm and dry.     Capillary Refill: Capillary refill takes less than 2 seconds.  Neurological:     General: No focal deficit present.     Mental Status: She is alert and oriented to person, place, and time.     Cranial Nerves: No cranial nerve deficit.     Motor: No weakness.  Psychiatric:        Mood and Affect: Mood normal.         Behavior: Behavior normal.        Thought Content: Thought content normal.        Judgment: Judgment normal.         Assessment And Plan:     1. Routine general medical examination at health care facility Behavior modifications discussed and diet history reviewed.  Pt will continue to exercise regularly and modify diet with low GI, plant based foods and decrease intake of processed foods.  Recommend intake of daily multivitamin, Vitamin D, and calcium.  Recommend mammogram and colonoscopy for preventive screenings, as well as recommend immunizations that include influenza, TDAP, and Shingles  2. Encounter for screening colonoscopy According to USPTF Colorectal cancer Screening guidelines. Colonoscopy is recommended every 10 years, starting at age 53 years. Will refer to GI for colon cancer screening. - Ambulatory referral to Gastroenterology  3. Immunization due Comments: 2nd Shingrix done after checking Trans Rx - Zoster Recombinant (Shingrix )  4. Class 1 obesity due to excess calories with serious comorbidity and body mass index (BMI) of 30.0 to 30.9 in adult She is encouraged to strive for BMI less than 30 to decrease cardiac risk. Advised to aim for at least 150 minutes of exercise per week.  5. Hypertensive heart and renal disease with renal failure, stage 1 through stage 4 or unspecified chronic kidney disease, without heart failure Comments: Blood pressure is well controlled, continue current medications - telmisartan (MICARDIS) 80 MG tablet; Take 1 tablet (80 mg total) by mouth daily.  Dispense: 90 tablet; Refill: 1  6. Type 2 diabetes mellitus with stage 3a chronic kidney disease, without long-term current use of insulin (HCC) Comments: Stable, continue current medications.  - Microalbumin / Creatinine Urine Ratio  7. Renal artery stenosis ([place])  8. History of supraventricular tachycardia      Patient was given opportunity to ask questions. Patient verbalized  understanding of the plan and was able to repeat key elements of the plan. All questions were answered to their satisfaction.   [name], FNP   Sara [name], [name], FNP, have reviewed all documentation for this visit. The documentation on 06/01/22 for the exam, diagnosis, procedures, and orders are all accurate and complete.   THE PATIENT IS ENCOURAGED TO PRACTICE SOCIAL DISTANCING DUE TO THE COVID-19 PANDEMIC.

## 2022-06-09 ENCOUNTER — Encounter: Payer: Self-pay | Admitting: Cardiovascular Disease

## 2022-06-09 ENCOUNTER — Encounter: Payer: Self-pay | Admitting: Nurse Practitioner

## 2022-06-09 LAB — HM DIABETES EYE EXAM

## 2022-06-09 NOTE — Progress Notes (Signed)
This encounter was created in error - please disregard.

## 2022-06-09 NOTE — Telephone Encounter (Signed)
Multiple encounters open. This encounter will be closed. See mychart message from today for more details.

## 2022-06-15 DIAGNOSIS — F331 Major depressive disorder, recurrent, moderate: Secondary | ICD-10-CM | POA: Diagnosis not present

## 2022-06-20 ENCOUNTER — Other Ambulatory Visit: Payer: Self-pay | Admitting: Cardiovascular Disease

## 2022-06-29 ENCOUNTER — Other Ambulatory Visit: Payer: Self-pay | Admitting: Nurse Practitioner

## 2022-06-29 ENCOUNTER — Ambulatory Visit: Payer: Self-pay

## 2022-06-29 DIAGNOSIS — E1122 Type 2 diabetes mellitus with diabetic chronic kidney disease: Secondary | ICD-10-CM

## 2022-06-29 MED ORDER — BLOOD GLUCOSE MONITOR KIT: PACK | 0 refills | Status: DC

## 2022-06-29 NOTE — Patient Instructions (Addendum)
Visit Information  Thank you for taking time to visit with me today. Please don't hesitate to contact me if I can be of assistance to you.   Following are the goals we discussed today:   Goals Addressed     Patient Stated     I would like to lose weight (pt-stated)        Care Coordination Interventions: Advised patient to discuss with primary care provider options regarding weight management Provided verbal and/or written education to patient re: provider recommended life style modifications  Advised patient to discuss PREP (provider referral exercise referral) with provider Sent referral to PCP requesting PREP referral       I would like to keep my blood pressure under good control(pt-stated)        Care Coordination Interventions: Evaluation of current treatment plan related to hypertension self management and patient's adherence to plan as established by provider Counseled on the importance of exercise goals with target of 150 minutes per week Advised patient, providing education and rationale, to monitor blood pressure daily and record, calling PCP for findings outside established parameters Provided education on prescribed diet low Sodium     Our next appointment is by telephone on 07/13/22 at 10:00 AM   Please call the care guide team at [phone] if you need to cancel or reschedule your appointment.   If you are experiencing a Mental Health or [place] or need someone to talk to, please call 1-800-273-TALK (toll free, 24 hour hotline)  Patient verbalizes understanding of instructions and care plan provided today and agrees to view in [place]. Active MyChart status and patient understanding of how to access instructions and care plan via MyChart confirmed with patient.     Barb Merino, RN, BSN, CCM Care Management Coordinator [facility] Care Management  Direct Phone: [phone]

## 2022-06-29 NOTE — Patient Outreach (Addendum)
  Care Coordination   Initial Visit Note   06/29/2022 Name: Sara [name] MRN: [id number] DOB: Oct 02, 1956  Sara [name] is a 66 y.o. year old female who sees [name], MD for primary care. I spoke with  Sara [name] by phone today.  What matters to the patients health and wellness today?  Patient would like to work on losing weight and management A1c.     Goals Addressed     Patient Stated     I would like to lose weight (pt-stated)        Care Coordination Interventions: Advised patient to discuss with primary care provider options regarding weight management Provided verbal and/or written education to patient re: provider recommended life style modifications  Advised patient to discuss PREP (provider referral exercise referral) with provider Sent referral to PCP requesting PREP referral       I would like to keep my blood pressure under good control (pt-stated)        Care Coordination Interventions: Evaluation of current treatment plan related to hypertension self management and patient's adherence to plan as established by provider Counseled on the importance of exercise goals with target of 150 minutes per week Advised patient, providing education and rationale, to monitor blood pressure daily and record, calling PCP for findings outside established parameters Provided education on prescribed diet low Sodium     SDOH assessments and interventions completed:  No     Care Coordination Interventions Activated:  Yes  Care Coordination Interventions:  Yes, provided   Follow up plan: Follow up call scheduled for 07/13/22 '@10'$ :00 AM    Encounter Outcome:  Pt. Visit Completed

## 2022-07-01 ENCOUNTER — Telehealth: Payer: Self-pay

## 2022-07-01 NOTE — Telephone Encounter (Signed)
Call to pt reference PREP referral Explained program to pt and class available at [facility] starting in Oct.  Can do Oct 17th on T/TH 1p-215p Will contact her closer to start of class to schedule intake  Patient requests info text to her-done to [phone]

## 2022-07-07 ENCOUNTER — Other Ambulatory Visit: Payer: Self-pay | Admitting: Internal Medicine

## 2022-07-07 DIAGNOSIS — I130 Hypertensive heart and chronic kidney disease with heart failure and stage 1 through stage 4 chronic kidney disease, or unspecified chronic kidney disease: Secondary | ICD-10-CM

## 2022-07-07 DIAGNOSIS — I10 Essential (primary) hypertension: Secondary | ICD-10-CM

## 2022-07-11 ENCOUNTER — Encounter: Payer: Self-pay | Admitting: Internal Medicine

## 2022-07-11 DIAGNOSIS — E669 Obesity, unspecified: Secondary | ICD-10-CM | POA: Diagnosis not present

## 2022-07-11 DIAGNOSIS — D75839 Thrombocytosis, unspecified: Secondary | ICD-10-CM | POA: Diagnosis not present

## 2022-07-11 DIAGNOSIS — D473 Essential (hemorrhagic) thrombocythemia: Secondary | ICD-10-CM | POA: Diagnosis not present

## 2022-07-11 DIAGNOSIS — E785 Hyperlipidemia, unspecified: Secondary | ICD-10-CM | POA: Diagnosis not present

## 2022-07-11 DIAGNOSIS — I252 Old myocardial infarction: Secondary | ICD-10-CM | POA: Diagnosis not present

## 2022-07-11 DIAGNOSIS — I10 Essential (primary) hypertension: Secondary | ICD-10-CM | POA: Diagnosis not present

## 2022-07-11 DIAGNOSIS — F411 Generalized anxiety disorder: Secondary | ICD-10-CM | POA: Diagnosis not present

## 2022-07-11 DIAGNOSIS — G40909 Epilepsy, unspecified, not intractable, without status epilepticus: Secondary | ICD-10-CM | POA: Diagnosis not present

## 2022-07-11 DIAGNOSIS — F324 Major depressive disorder, single episode, in partial remission: Secondary | ICD-10-CM | POA: Diagnosis not present

## 2022-07-13 ENCOUNTER — Ambulatory Visit: Payer: Self-pay

## 2022-07-13 NOTE — Patient Outreach (Signed)
  Care Coordination   07/13/2022 Name: SONITA MICHIELS MRN: [id number] DOB: 1956/09/02   Care Coordination Outreach Attempts:  An unsuccessful telephone outreach was attempted for a scheduled appointment today.  Follow Up Plan:  Additional outreach attempts will be made to offer the patient care coordination information and services.   Encounter Outcome:  No Answer  Care Coordination Interventions Activated:  No   Care Coordination Interventions:  No, not indicated    Barb Merino, RN, BSN, CCM Care Management Coordinator [place] Management Direct Phone: [phone]

## 2022-07-14 ENCOUNTER — Other Ambulatory Visit: Payer: Self-pay

## 2022-07-14 ENCOUNTER — Other Ambulatory Visit (INDEPENDENT_AMBULATORY_CARE_PROVIDER_SITE_OTHER): Payer: Medicare PPO

## 2022-07-14 ENCOUNTER — Encounter: Payer: Self-pay | Admitting: Nurse Practitioner

## 2022-07-14 DIAGNOSIS — E1122 Type 2 diabetes mellitus with diabetic chronic kidney disease: Secondary | ICD-10-CM | POA: Diagnosis not present

## 2022-07-14 DIAGNOSIS — N1831 Type 2 diabetes mellitus with diabetic chronic kidney disease: Secondary | ICD-10-CM

## 2022-07-14 LAB — POCT URINALYSIS DIPSTICK
Bilirubin, UA: NEGATIVE
Blood, UA: NEGATIVE
Glucose, UA: NEGATIVE
Ketones, UA: NEGATIVE
Nitrite, UA: NEGATIVE
Protein, UA: NEGATIVE
Spec Grav, UA: 1.01 (ref 1.010–1.025)
Urobilinogen, UA: 0.2 E.U./dL
pH, UA: 5.5 (ref 5.0–8.0)

## 2022-07-15 LAB — MICROALBUMIN / CREATININE URINE RATIO
Creatinine, Urine: 76.6 mg/dL
Microalb/Creat Ratio: 12 mg/g creat (ref 0–29)
Microalbumin, Urine: 9.5 ug/mL

## 2022-07-18 IMAGING — DX DG ABDOMEN 1V
1 series · 2 of 2 positions shown · non-contrast
Comparison: None.

CLINICAL DATA: Abdominal pain

EXAM:
ABDOMEN - 1 VIEW

[Series 1: abdomen · 0.14mm/px · 2 of 2 slices shown]
[im 1/2]
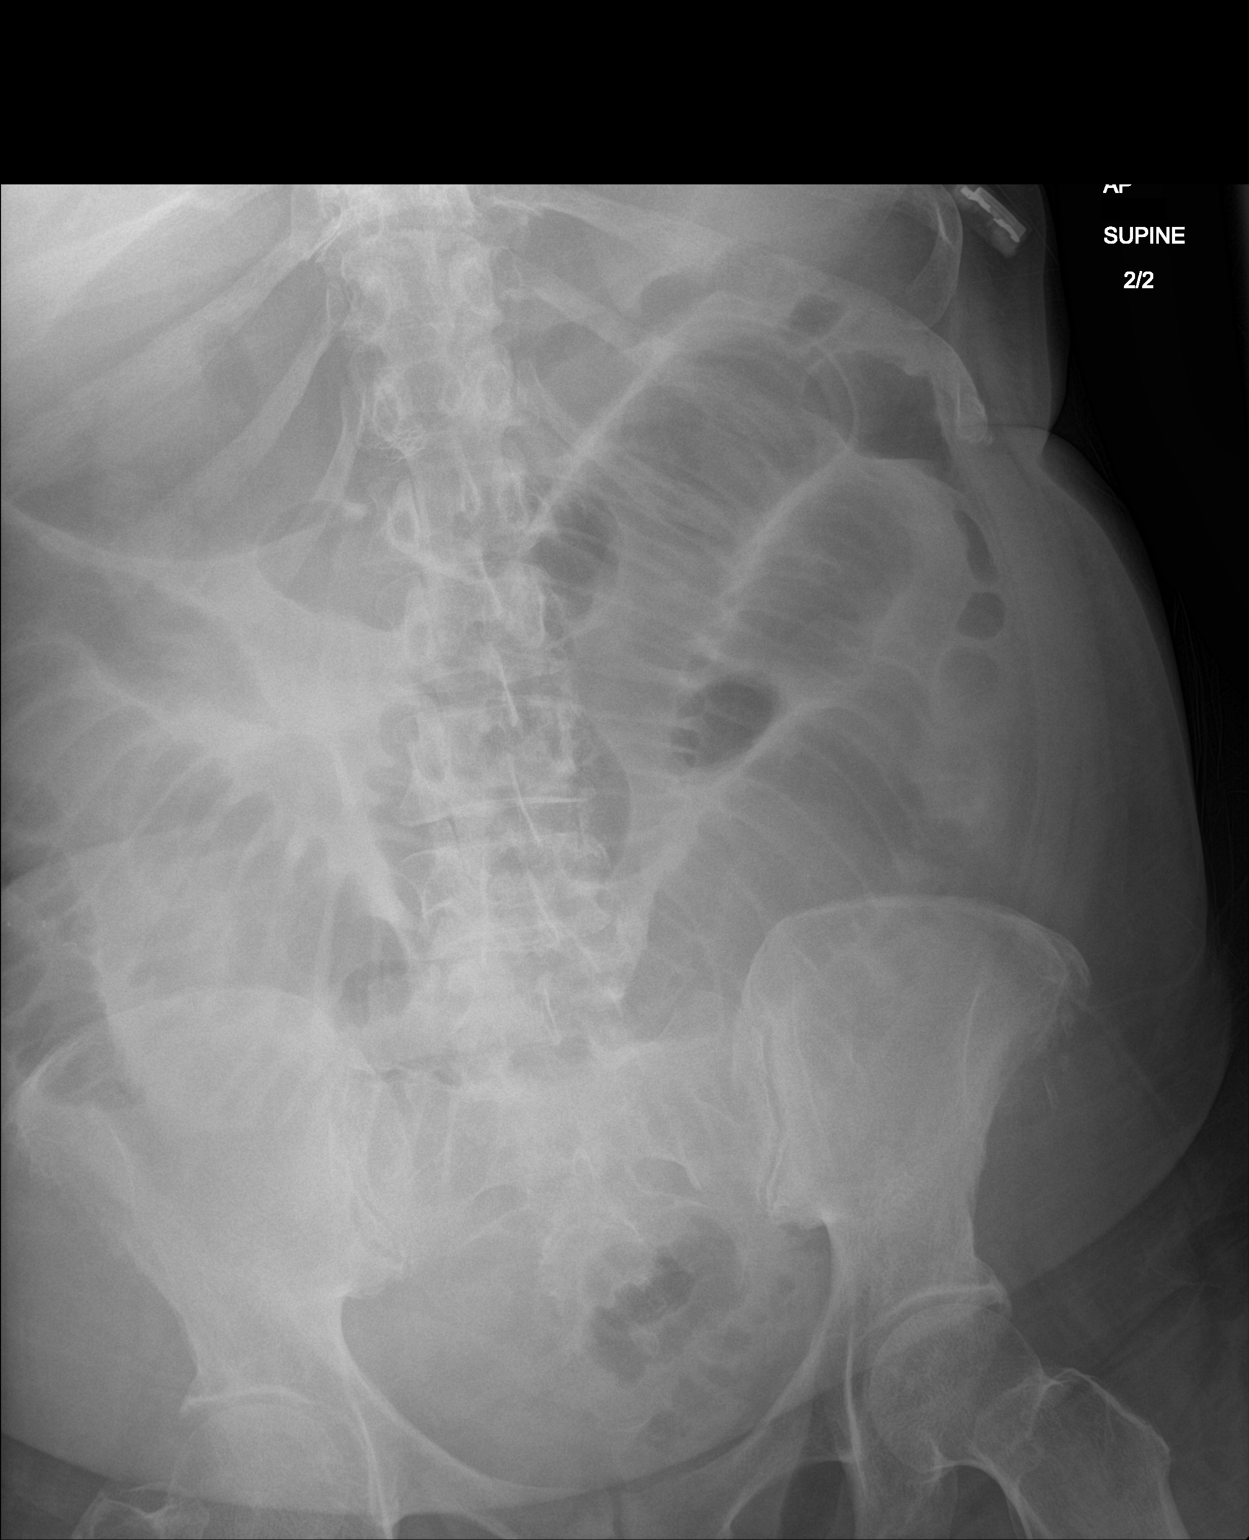
[im 2/2]
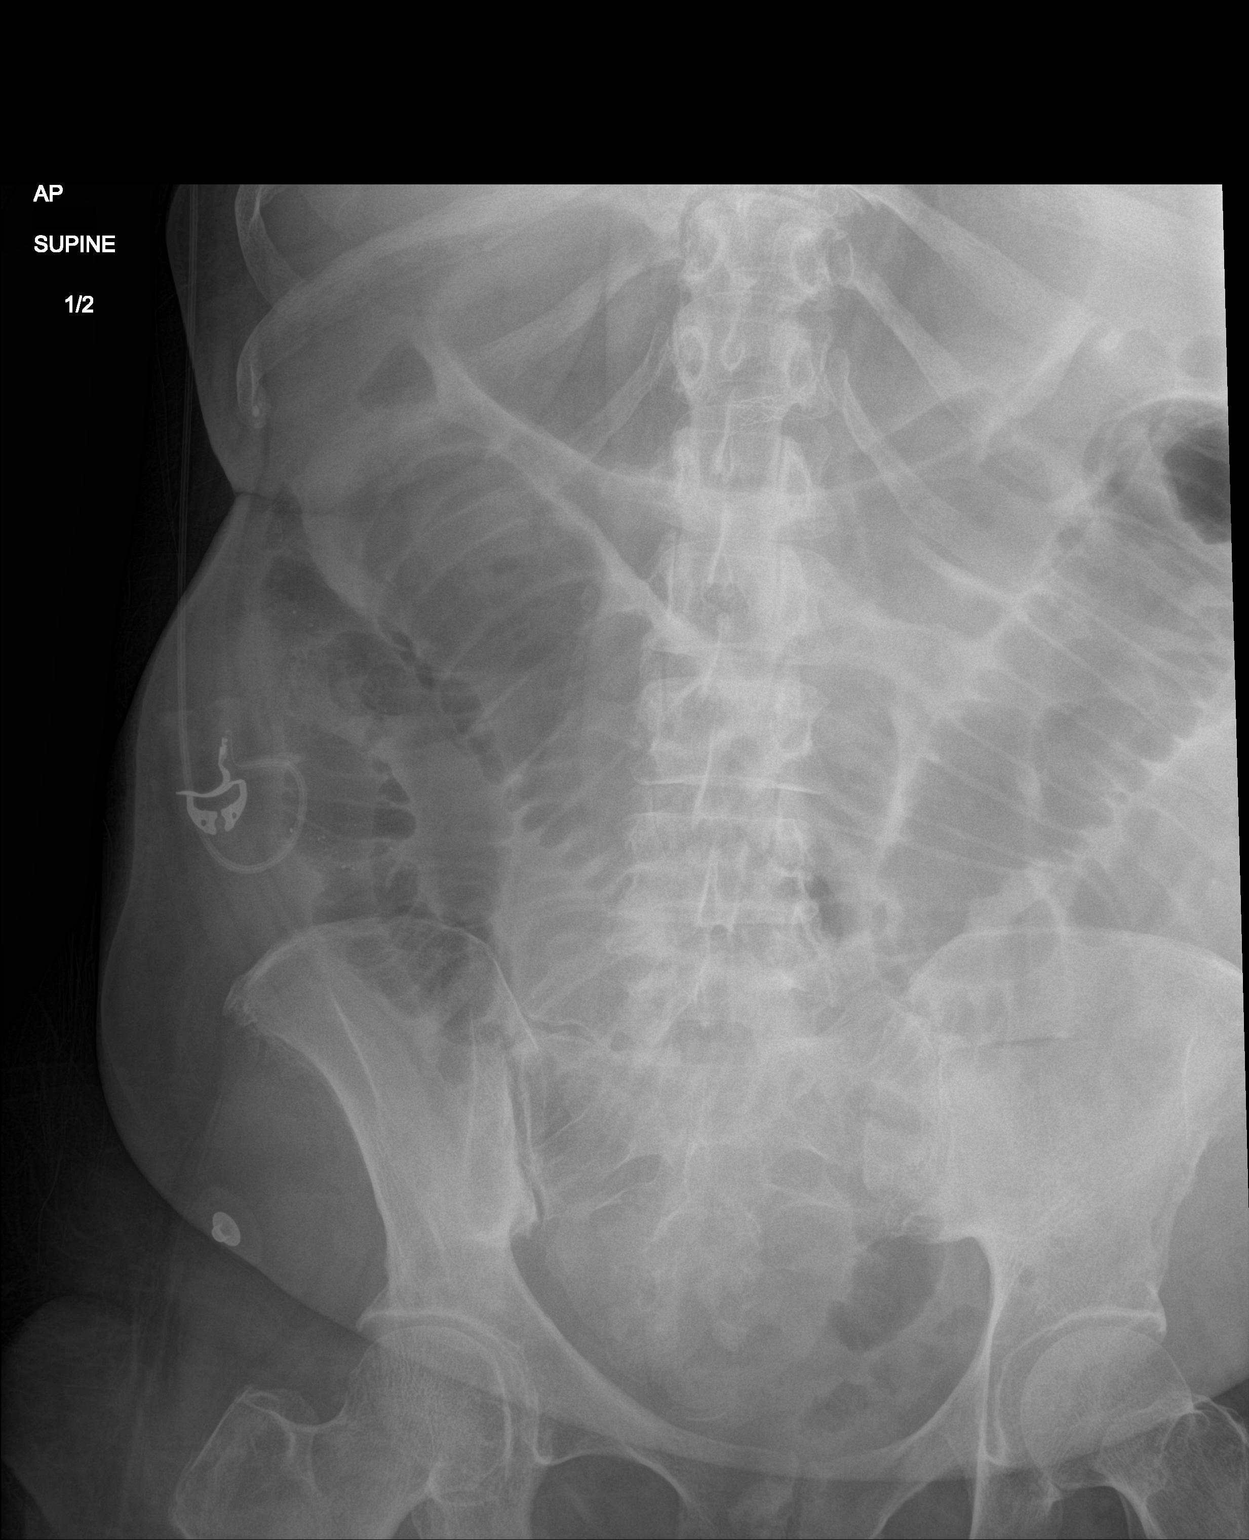

[2 of 2 positions shown; findings below may reference images not displayed]

FINDINGS: Diffuse small bowel dilatation is identified with a paucity of
colonic gas. No free air, portal venous gas, or pneumatosis. No
other acute abnormalities.
IMPRESSION: Small bowel obstruction.

Insert [facility] report

## 2022-07-20 DIAGNOSIS — F331 Major depressive disorder, recurrent, moderate: Secondary | ICD-10-CM | POA: Diagnosis not present

## 2022-07-20 DIAGNOSIS — F411 Generalized anxiety disorder: Secondary | ICD-10-CM | POA: Diagnosis not present

## 2022-07-21 IMAGING — CR DG ABDOMEN 1V
1 series · 1 of 1 positions shown · non-contrast
Comparison: Abdominal radiograph yesterday

CLINICAL DATA: Small-bowel obstruction

EXAM:
ABDOMEN - 1 VIEW

[abdomen kub]
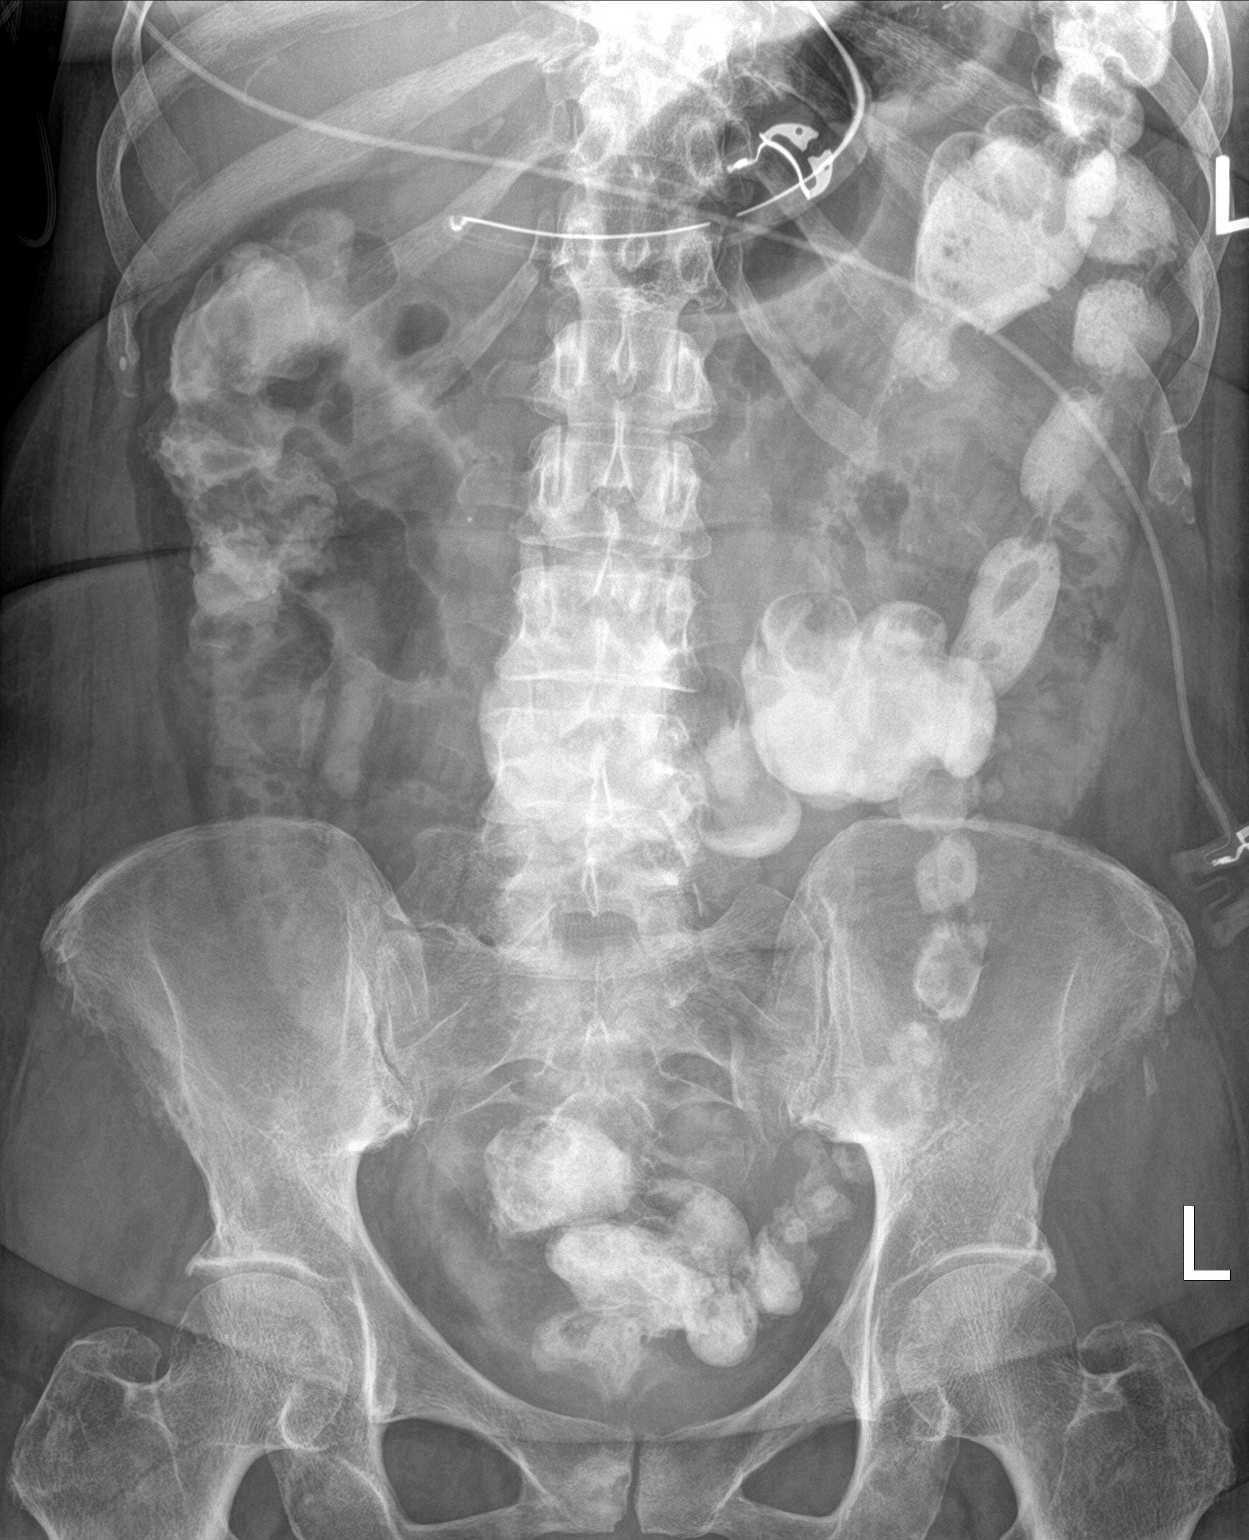

[1 of 1 positions shown; findings below may reference images not displayed]

FINDINGS: Gastric tube remains in good position with the tip overlying the
pre-pyloric gastric antrum. Markedly reduced prominence of small
bowel gas pattern. Oral contrast material now noted throughout the
colon. No large free air on this single supine series. No acute
osseous abnormality.
IMPRESSION: 1. Resolving small bowel obstruction.
2. Oral contrast material is now noted throughout the colon.
3. Well-positioned gastrostomy tube.

## 2022-07-22 ENCOUNTER — Encounter: Payer: Self-pay | Admitting: Nurse Practitioner

## 2022-07-22 ENCOUNTER — Encounter: Payer: Self-pay | Admitting: Hematology

## 2022-07-22 ENCOUNTER — Encounter: Payer: Self-pay | Admitting: Cardiovascular Disease

## 2022-07-22 ENCOUNTER — Telehealth: Payer: Self-pay

## 2022-07-22 NOTE — Telephone Encounter (Signed)
Call to pt and confirmed PREP starting on 08/09/22 T/TH 1p-215p Intake scheduled for 08/02/22 at 4pm at [place] times emailed to pt as requested

## 2022-07-22 NOTE — Telephone Encounter (Signed)
Fully agree with our clinical pharmacist.  Thanks

## 2022-07-29 ENCOUNTER — Inpatient Hospital Stay: Payer: Medicare PPO | Attending: Nurse Practitioner

## 2022-07-29 ENCOUNTER — Other Ambulatory Visit: Payer: Self-pay

## 2022-07-29 DIAGNOSIS — D473 Essential (hemorrhagic) thrombocythemia: Secondary | ICD-10-CM | POA: Diagnosis not present

## 2022-07-29 DIAGNOSIS — Z0000 Encounter for general adult medical examination without abnormal findings: Secondary | ICD-10-CM

## 2022-07-29 LAB — CBC WITH DIFFERENTIAL/PLATELET
Abs Immature Granulocytes: 0.02 10*3/uL (ref 0.00–0.07)
Basophils Absolute: 0.1 10*3/uL (ref 0.0–0.1)
Basophils Relative: 1 %
Eosinophils Absolute: 0.3 10*3/uL (ref 0.0–0.5)
Eosinophils Relative: 4 %
HCT: 42.4 % (ref 36.0–46.0)
Hemoglobin: 14.3 g/dL (ref 12.0–15.0)
Immature Granulocytes: 0 %
Lymphocytes Relative: 22 %
Lymphs Abs: 1.2 10*3/uL (ref 0.7–4.0)
MCH: 31 pg (ref 26.0–34.0)
MCHC: 33.7 g/dL (ref 30.0–36.0)
MCV: 91.8 fL (ref 80.0–100.0)
Monocytes Absolute: 0.4 10*3/uL (ref 0.1–1.0)
Monocytes Relative: 6 %
Neutro Abs: 3.7 10*3/uL (ref 1.7–7.7)
Neutrophils Relative %: 67 %
Platelets: 379 10*3/uL (ref 150–400)
RBC: 4.62 MIL/uL (ref 3.87–5.11)
RDW: 13.3 % (ref 11.5–15.5)
WBC Morphology: REACTIVE
WBC: 5.6 10*3/uL (ref 4.0–10.5)
nRBC: 0 % (ref 0.0–0.2)

## 2022-07-29 LAB — IRON AND IRON BINDING CAPACITY (CC-WL,HP ONLY)
Iron: 81 ug/dL (ref 28–170)
Saturation Ratios: 21 % (ref 10.4–31.8)
TIBC: 385 ug/dL (ref 250–450)
UIBC: 304 ug/dL (ref 148–442)

## 2022-08-04 NOTE — Progress Notes (Signed)
[facility] PREP Evaluation  Patient Details  Name: Sara [name] MRN: [id number] Date of Birth: 05-22-1956 Age: 66 y.o. PCP: [name], MD  Vitals:   08/03/22 1700  BP: (!) 142/80  Pulse: 62  SpO2: 97%  Weight: 167 lb 3.2 oz (75.8 kg)     [facility] Eval - 08/04/22 1300       [facility] "PREP" Location   [facility] "PREP" Location [facility]      Referral    Referring Provider [place]    Reason for referral Diabetes;Heart Failure;Inactivity;Hypertension    Program Start Date 08/03/22   T/TH 1p-215p x 12 wks     Measurement   Waist Circumference 37 inches    Hip Circumference 42 inches    Body fat 43.3 percent      Information for Trainer   Goals Get off BP meds, enjoy grandchildren, manage stress    Current Exercise walks and errands    Orthopedic Concerns none    Pertinent Medical History CHF, HTN, s/p CABG, OSA, PRE DM2, CKD3    Current Barriers none    Medications that affect exercise Medication causing dizziness/drowsiness      Timed Up and Go (TUGS)   Timed Up and Go Low risk <9 seconds      Mobility and Daily Activities   I find it easy to walk up or down two or more flights of stairs. 4    I have no trouble taking out the trash. 4    I do housework such as vacuuming and dusting on my own without difficulty. 4    I can easily lift a gallon of milk (8lbs). 4    I can easily walk a mile. 4    I have no trouble reaching into high cupboards or reaching down to pick up something from the floor. 4    I do not have trouble doing out-door work such as Armed forces logistics/support/administrative officer, raking leaves, or gardening. 2      Mobility and Daily Activities   I feel younger than my age. 3    I feel independent. 4    I feel energetic. 4    I live an active life.  4    I feel strong. 4    I feel healthy. 4    I feel active as other people my age. 4      How fit and strong are you.   Fit and Strong Total Score 53            Past Medical History:  Diagnosis Date   Anxiety    Blood  dyscrasia    Chronic kidney disease    Gout    Hyperlipemia    Hypertension    LVH (left ventricular hypertrophy)    12/21/09 Echo -mild asymmetric LVH,EF =>55%   Morbid obesity (HCC)    Myocardial infarction (HCC)    OSA (obstructive sleep apnea)    PAD (peripheral artery disease) ([place])    Pneumonia    Pre-diabetes    Renal artery stenosis ([place]) 12/13/2010   PTCA and Stent - right   Seizures (HCC)    Syncopal episodes    Thrombocytosis    Past Surgical History:  Procedure Laterality Date   BREAST CYST EXCISION     S/P Benign Right Breast Cyst Removal.   BREAST REDUCTION SURGERY  1995   S/P Bilateral breast reduction   [place], 1986   Times  Two.   CORONARY  ARTERY BYPASS GRAFT N/A 04/21/2017   Procedure: CORONARY ARTERY BYPASS GRAFTING (CABG) x 2, USING LEFT MAMMARY ARTERY AND RIGHT GREATER SAPHENOUS VEIN HARVESTED ENDOSCOPICALLY;  Surgeon: [name], MD;  Location: [facility];  Service: Open Heart Surgery;  Laterality: N/A;   EXTREMITY CYST EXCISION  1993   left wrist   LEFT HEART CATH AND CORONARY ANGIOGRAPHY N/A 04/17/2017   Procedure: Left Heart Cath and Coronary Angiography;  Surgeon: Jettie Booze, MD;  Location: [facility];  Service: Cardiovascular;  Laterality: N/A;   PARTIAL HYSTERECTOMY  1994   renal artery stent placement Right    TEE WITHOUT CARDIOVERSION N/A 04/21/2017   Procedure: TRANSESOPHAGEAL ECHOCARDIOGRAM (TEE);  Surgeon: [name], MD;  Location: [facility];  Service: Open Heart Surgery;  Laterality: N/A;   VENTRAL HERNIA REPAIR N/A 06/17/2021   Procedure: OPEN VENTRAL HERNIA REPAIR WITH MESH;  Surgeon: Kinsinger, Arta Bruce, MD;  Location: [facility];  Service: General;  Laterality: N/A;   Social History   Tobacco Use  Smoking Status Never  Smokeless Tobacco Never    Barnett Hatter 08/04/2022, 1:07 PM

## 2022-08-09 ENCOUNTER — Ambulatory Visit: Payer: Self-pay

## 2022-08-09 NOTE — Patient Outreach (Signed)
  Care Coordination   08/09/2022 Name: MAILYN STEICHEN MRN: [id number] DOB: 1956-09-22   Care Coordination Outreach Attempts:  An unsuccessful telephone outreach was attempted for a scheduled appointment today.  Follow Up Plan:  Additional outreach attempts will be made to offer the patient care coordination information and services.   Encounter Outcome:  No Answer  Care Coordination Interventions Activated:  No   Care Coordination Interventions:  No, not indicated    Barb Merino, RN, BSN, CCM Care Management Coordinator [place] Management  Direct Phone: [phone]

## 2022-08-16 DIAGNOSIS — N1831 Chronic kidney disease, stage 3a: Secondary | ICD-10-CM | POA: Diagnosis not present

## 2022-08-16 DIAGNOSIS — Z23 Encounter for immunization: Secondary | ICD-10-CM | POA: Diagnosis not present

## 2022-08-16 DIAGNOSIS — I2510 Atherosclerotic heart disease of native coronary artery without angina pectoris: Secondary | ICD-10-CM | POA: Diagnosis not present

## 2022-08-16 DIAGNOSIS — D75839 Thrombocytosis, unspecified: Secondary | ICD-10-CM | POA: Diagnosis not present

## 2022-08-16 DIAGNOSIS — E1122 Type 2 diabetes mellitus with diabetic chronic kidney disease: Secondary | ICD-10-CM | POA: Diagnosis not present

## 2022-08-16 DIAGNOSIS — I129 Hypertensive chronic kidney disease with stage 1 through stage 4 chronic kidney disease, or unspecified chronic kidney disease: Secondary | ICD-10-CM | POA: Diagnosis not present

## 2022-08-16 DIAGNOSIS — E785 Hyperlipidemia, unspecified: Secondary | ICD-10-CM | POA: Diagnosis not present

## 2022-08-16 DIAGNOSIS — G4733 Obstructive sleep apnea (adult) (pediatric): Secondary | ICD-10-CM | POA: Diagnosis not present

## 2022-08-19 NOTE — Progress Notes (Signed)
[facility] PREP Weekly Session  Patient Details  Name: LAVONN MAXCY MRN: [id number] Date of Birth: 01-11-1956 Age: 66 y.o. PCP: [name], MD  Vitals:   08/16/22 1300  Weight: 168 lb (76.2 kg)     [facility] Weekly seesion - 08/19/22 1300       [facility] "PREP" Location   [facility] "PREP" Location [facility]      Weekly Session   Topic Discussed Importance of resistance training;Other ways to be active    Minutes exercised this week 240 minutes    Classes attended to date [place] 08/19/2022, 1:30 PM

## 2022-08-25 ENCOUNTER — Telehealth: Payer: Self-pay | Admitting: *Deleted

## 2022-08-25 NOTE — Chronic Care Management (AMB) (Signed)
  Care Coordination Note  08/25/2022 Name: Sara [name] MRN: [id number] DOB: 06/29/1956  Sara [name] is a 66 y.o. year old female who is a primary care patient of [name], MD and is actively engaged with the care management team. I reached out to Sara [name] by phone today to assist with re-scheduling a follow up visit with the RN Case Manager  Follow up plan: Unsuccessful telephone outreach attempt made. A HIPAA compliant phone message was left for the patient providing contact information and requesting a return call.   [place]  Direct Dial: [phone]

## 2022-08-26 NOTE — Telephone Encounter (Signed)
  Care Coordination Note  08/26/2022 Name: Sara [name] MRN: [id number] DOB: 1956/09/22  Sara [name] is a 66 y.o. year old female who is a primary care patient of [name], MD and is actively engaged with the care management team. I reached out to Sara [name] by phone today to assist with re-scheduling a follow up visit with the RN Case Manager  Follow up plan: Telephone appointment with care management team member scheduled for:09/09/22  [place]  Direct Dial: [phone]

## 2022-08-30 NOTE — Progress Notes (Signed)
[facility] PREP Weekly Session  Patient Details  Name: Sara [name] MRN: [id number] Date of Birth: [birth year] Age: 66 y.o. PCP: [name], MD  Vitals:   08/30/22 1300  Weight: 166 lb (75.3 kg)     [facility] Weekly seesion - 08/30/22 1500       [facility] "PREP" Location   [facility] "PREP" [facility]      Weekly Session   Topic Discussed Health habits   sugar demo, importance of sleep   Minutes exercised this week 75 minutes    Classes attended to date [place] 08/30/2022, 3:58 PM

## 2022-09-05 ENCOUNTER — Encounter: Payer: Self-pay | Admitting: *Deleted

## 2022-09-05 NOTE — Progress Notes (Signed)
Chart review indicates that pt has been seen by PCP office FNP with multiple documented messages. A1C f/u shows pt has been given glucometer and supplies and has upcoming 12/23 PCP appt - also multiple documented notes by [facility] CM RN about DM follow up and future appts also scheuduled. No additional follow up or communication from [place] team indicated at this time.

## 2022-09-08 ENCOUNTER — Other Ambulatory Visit: Payer: Self-pay | Admitting: Physician Assistant

## 2022-09-08 DIAGNOSIS — D473 Essential (hemorrhagic) thrombocythemia: Secondary | ICD-10-CM

## 2022-09-09 ENCOUNTER — Ambulatory Visit: Payer: Self-pay

## 2022-09-09 NOTE — Patient Instructions (Signed)
Visit Information  Thank you for taking time to visit with me today. Please don't hesitate to contact me if I can be of assistance to you.   Following are the goals we discussed today:   Goals Addressed               This Visit's Progress     Patient Stated     I want to keep my blood pressure under good control (pt-stated)        Care Coordination Interventions: Evaluation of current treatment plan related to hypertension self management and patient's adherence to plan as established by provider Determined patient is participating in the provider referral exercise program at the [facility], she is enjoying the program  Encouraged patient to continue limiting her Sodium intake and to continue to monitor her BP at home as directed  Reinforced to stay well hydrated with water aiming for 48-64 oz daily  Discussed family stressors that often effect patient's BP  Educated patient regarding availability of LCSW for telephonic counseling and patient would like to proceed Sent in basket message to Christa See LCSW requesting she assist patient  Reviewed upcoming scheduled PCP visit for BP check with Dr. Baird Cancer scheduled for 10/10/22 '@820'$  AM        I would like to lose weight (pt-stated)        Care Coordination Interventions: Advised patient to discuss with primary care provider options regarding weight management Determined patient continues to work on weight loss and staying healthy Determined patient is participating in the [place] program and finding this to be very beneficial Discussed patient's target weight is 150 lbs, with current weight at 166 lbs today Positive reinforcement given to patient for making efforts to make healthy decisions to help improve her overall health           Our next appointment is by telephone on 11/09/22 at 1030 AM  Please call the care guide team at [phone] if you need to cancel or reschedule your appointment.   If you are experiencing a Mental  Health or [place] or need someone to talk to, please call 1-800-273-TALK (toll free, 24 hour hotline)  Patient verbalizes understanding of instructions and care plan provided today and agrees to view in [place]. Active MyChart status and patient understanding of how to access instructions and care plan via MyChart confirmed with patient.     Barb Merino, RN, BSN, CCM Care Management Coordinator [facility] Care Management  Direct Phone: [phone]

## 2022-09-09 NOTE — Patient Outreach (Addendum)
  Care Coordination   Follow Up Visit Note   09/09/2022 Name: Sara [name] MRN: [id number] DOB: 1956/08/11  Sara [name] is a 66 y.o. year old female who sees [name], MD for primary care. I spoke with  Sara [name] by phone today.  What matters to the patients health and wellness today?  Patient will continue to work on weight loss and monitoring her blood pressure at home.     Goals Addressed               This Visit's Progress     Patient Stated     I want to keep my blood pressure under good control (pt-stated)        Care Coordination Interventions: Evaluation of current treatment plan related to hypertension self management and patient's adherence to plan as established by provider Determined patient is participating in the provider referral exercise program at the [facility], she is enjoying the program  Encouraged patient to continue limiting her Sodium intake and to continue to monitor her BP at home as directed  Reinforced to stay well hydrated with water aiming for 48-64 oz daily  Discussed family stressors that often effect patient's BP  Educated patient regarding availability of LCSW for telephonic counseling and patient would like to proceed Sent in basket message to Christa See LCSW requesting she assist patient  Reviewed upcoming scheduled PCP visit for BP check with Dr. Baird Cancer scheduled for 10/10/22 '@820'$  AM        I would like to lose weight (pt-stated)        Care Coordination Interventions: Advised patient to discuss with primary care provider options regarding weight management Determined patient continues to work on weight loss and staying healthy Determined patient is participating in the [place] program and finding this to be very beneficial Discussed patient's target weight is 150 lbs, with current weight at 166 lbs today Positive reinforcement given to patient for making efforts to make healthy decisions to help improve her overall health            SDOH assessments and interventions completed:  No     Care Coordination Interventions Activated:  Yes  Care Coordination Interventions:  Yes, provided   Follow up plan: Referral made to Christa See LCSW to assist patient with counseling  Follow up call scheduled for 11/09/22 @ 10:30 AM    Encounter Outcome:  Pt. Visit Completed

## 2022-09-21 DIAGNOSIS — F4322 Adjustment disorder with anxiety: Secondary | ICD-10-CM | POA: Diagnosis not present

## 2022-09-22 ENCOUNTER — Telehealth: Payer: Self-pay | Admitting: Licensed Clinical Social Worker

## 2022-09-22 NOTE — Patient Outreach (Signed)
  Care Coordination   Initial Visit Note   09/22/2022 Name: Sara [name] MRN: [id number] DOB: 27-Nov-1955  Sara [name] is a 66 y.o. year old female who sees Sara Chard, MD for primary care. I  pt's spouse  What matters to the patients health and wellness today?  LCSW attempted to speak with pt, in regards to scheduling initial appt with LCSW for care coordination services. Pt's spouse took message and agreed to have pt contact LCSW directly   SDOH assessments and interventions completed:  No     Care Coordination Interventions:  Yes, provided   Follow up plan:  LCSW will await a return call.   LCW will make additional attempts  Encounter Outcome:  Pt. Request to Call Sara [name], MSW, [name] Phone [phone] 2:17 PM

## 2022-09-23 NOTE — Patient Outreach (Signed)
  Care Coordination   Initial Visit Note   09/23/2022 Name: DELINDA [name] MRN: [id number] DOB: 08-Dec-1955  Sara [name] is a 66 y.o. year old female who sees [name], MD for primary care. I spoke with  Sara [name] by phone today.  What matters to the patients health and wellness today?  Care Coordination with LCSW    Goals Addressed             This Visit's Progress    LCSW-Strengthen Support   On track    Care Coordination Interventions: Solution-Focused Strategies employed:  Active listening / Reflection utilized  Emotional Support Provided LCSW informed patient of care coordination services. Pt is interested in meeting with LCSW and scheduled initial appt        SDOH assessments and interventions completed:  No     Care Coordination Interventions:  Yes, provided   Follow up plan: Follow up call scheduled for 1-2 weeks    Encounter Outcome:  Pt. Visit Completed   Christa See, MSW, [email or web address] Phone [phone] 8:08 AM

## 2022-09-23 NOTE — Patient Instructions (Signed)
Visit Information  Thank you for taking time to visit with me today. Please don't hesitate to contact me if I can be of assistance to you.   Following are the goals we discussed today:   Goals Addressed             This Visit's Progress    LCSW-Strengthen Support   On track    Care Coordination Interventions: Solution-Focused Strategies employed:  Active listening / Reflection utilized  Emotional Support Provided LCSW informed patient of care coordination services. Pt is interested in meeting with LCSW and scheduled initial appt        Our next appointment is by telephone on 12/5 at 11:30 AM  Please call the care guide team at [phone] if you need to cancel or reschedule your appointment.   If you are experiencing a Mental Health or [place] or need someone to talk to, please call the Suicide and Crisis Lifeline: 988 call 911   Patient verbalizes understanding of instructions and care plan provided today and agrees to view in [place]. Active MyChart status and patient understanding of how to access instructions and care plan via MyChart confirmed with patient.     Christa See, MSW, [email or web address] Phone [phone] 8:08 AM

## 2022-09-27 ENCOUNTER — Ambulatory Visit: Payer: Self-pay | Admitting: Licensed Clinical Social Worker

## 2022-09-28 NOTE — Progress Notes (Signed)
[facility] PREP Weekly Session  Patient Details  Name: Sara [name] MRN: [id number] Date of Birth: April 12, 1956 Age: 66 y.o. PCP: [name], MD  Vitals:   09/27/22 1300  Weight: 164 lb (74.4 kg)     [facility] Weekly seesion - 09/28/22 1200       [facility] "PREP" Location   [facility] "PREP" Location [facility]      Weekly Session   Topic Discussed Expectations and non-scale victories    Classes attended to date 8            Encouraged to find help for stressful home situations  Barnett Hatter 09/28/2022, 12:25 PM

## 2022-09-29 NOTE — Patient Instructions (Signed)
Visit Information  Thank you for taking time to visit with me today. Please don't hesitate to contact me if I can be of assistance to you.   Following are the goals we discussed today:   Goals Addressed             This Visit's Progress    LCSW-Strengthen Support   On track    Care Coordination Interventions: Solution-Focused Strategies employed:  Active listening / Reflection utilized  Emotional Support Provided Patient identified stressors that negatively impact mental health, including chronic health conditions of son and recent loss of nephew Stages of grief was discussed. Validation and encouragement provided Patient is motivated to continue with management of health conditions noting she has loss substantial weight and participates in exercise classes at the [place] skills discussed LCSW will follow up with PCP office regarding space to meet patient in clinic for future sessions        Please call the care guide team at [phone] if you need to cancel or reschedule your appointment.   If you are experiencing a Mental Health or [place] or need someone to talk to, please call the Suicide and Crisis Lifeline: 988 call 911   Patient verbalizes understanding of instructions and care plan provided today and agrees to view in [place]. Active MyChart status and patient understanding of how to access instructions and care plan via MyChart confirmed with patient.     Christa See, MSW, [email or web address] Phone [phone] 5:03 PM

## 2022-09-29 NOTE — Patient Outreach (Signed)
  Care Coordination   Initial Visit Note   09/29/2022 Name: Sara [name] MRN: [id number] DOB: [birth year]  Sara [name] is a 66 y.o. year old female who sees [name], MD for primary care. I spoke with  Sara [name] by phone today.  What matters to the patients health and wellness today?  Grief, Stress management    Goals Addressed             This Visit's Progress    LCSW-Strengthen Support   On track    Care Coordination Interventions: Solution-Focused Strategies employed:  Active listening / Reflection utilized  Emotional Support Provided Patient identified stressors that negatively impact mental health, including chronic health conditions of son and recent loss of nephew Stages of grief was discussed. Validation and encouragement provided Patient is motivated to continue with management of health conditions noting she has loss substantial weight and participates in exercise classes at the [employer] skills discussed LCSW will follow up with PCP office regarding space to meet patient in clinic for future sessions        SDOH assessments and interventions completed:  Yes  SDOH Interventions Today    Flowsheet Row Most Recent Value  SDOH Interventions   Food Insecurity Interventions Intervention Not Indicated  Housing Interventions Intervention Not Indicated  Transportation Interventions Intervention Not Indicated        Care Coordination Interventions:  Yes, provided   Follow up plan:  LCSW will follow up with pt to schedule f/up appt    Encounter Outcome:  Pt. Visit Completed   Christa See, MSW, [email or web address] Phone [phone] 5:02 PM

## 2022-10-03 ENCOUNTER — Encounter: Payer: Self-pay | Admitting: Adult Health

## 2022-10-03 ENCOUNTER — Ambulatory Visit: Payer: Medicare PPO | Admitting: Adult Health

## 2022-10-03 DIAGNOSIS — H2513 Age-related nuclear cataract, bilateral: Secondary | ICD-10-CM | POA: Diagnosis not present

## 2022-10-03 DIAGNOSIS — H5203 Hypermetropia, bilateral: Secondary | ICD-10-CM | POA: Diagnosis not present

## 2022-10-03 DIAGNOSIS — Z135 Encounter for screening for eye and ear disorders: Secondary | ICD-10-CM | POA: Diagnosis not present

## 2022-10-03 DIAGNOSIS — R569 Unspecified convulsions: Secondary | ICD-10-CM

## 2022-10-03 DIAGNOSIS — H52223 Regular astigmatism, bilateral: Secondary | ICD-10-CM | POA: Diagnosis not present

## 2022-10-03 DIAGNOSIS — H524 Presbyopia: Secondary | ICD-10-CM | POA: Diagnosis not present

## 2022-10-03 MED ORDER — LACOSAMIDE 150 MG PO TABS: ORAL_TABLET | ORAL | 5 refills | Status: DC

## 2022-10-03 NOTE — Progress Notes (Signed)
PATIENT: Sara [name] DOB: 1956/03/06  REASON FOR VISIT: follow up HISTORY FROM: patient  Chief Complaint  Patient presents with   Follow-up    Rm 19,  No seizures as far as she knows.  Husband says doing ok.  Wears dental appliance (getting new appliance so has not been wearing).       HISTORY OF PRESENT ILLNESS: Today 10/03/22:  Sara [name] is a 66 year old [name] with a history of seizures and obstructive sleep apnea.  She returns today for follow-up.  She denies any additional seizure events.  She remains on [name] 150 mg twice a day.  Operates a motor vehicle without difficulty.  Able to complete all ADLs independently.  She continues to use dental device for sleep apnea but reports that her and Dr. Ron Parker are looking at another type of device. Has some anxiety with the previous device because it "locked in."  03/15/22: Sara [name] is a 66 year old [name] with a history of seizures and obstructive sleep apnea.  She returns today for follow-up.  Seizures: Reports that she has not had any additional events that she knows of since she started [name] 150 mg twice a day.  Tolerating the medication well. Driving a motor vehicle. Able to do everything for herself.   Obstructive sleep apnea: Patient had repeat testing that showed mild sleep apnea.  She now using a dental device. Feels that it helps but not as good as the cpap. Following with Dr. Ron Parker.   She returns today for follow-up  04/29/21: Sara [name] is a 66 year old [name] with a history of seizures and obstructive sleep apnea.  She returns today for follow-up.  She states that she had another "attack" in December or January.  The attacks always consist of her making sounds during her sleep.  She called her primary care provider.  Dr. Baird Cancer suggested that she take her sugar or have her husband take her sugar when she is making the sounds.  The patient states that typically when she wakes up she feels fine.  She continues to take  [name] 100 mg in the morning and 149m at bedtime.  She denies missing any medication.  She returns today for an evaluation.  07/22/20:Sara [name] with a history of seizures and obstructive sleep apnea.  She returns today for follow-up.  Since her [name] was increased she reports that her husband has not reported any nocturnal events.  Overall she feels that she has been doing well.  Her CPAP download indicates that she use her machine nightly for compliance of 100%.  She used her machine greater than 4 hours each night.  On average she uses her machine 6 hours and 36 minutes.  Her residual AHI is 1.6 on 9 cm of water with EPR of 2.  Leak in the 95th percentile is 42 L/min.  She states that she does not feel the machine leaking at night.  Overall she feels that she is doing well with the CPAP  HISTORY 03/02/20  Ms. GCarrenois a 66year old [name] with history of seizures and obstructive sleep apnea on CPAP.  Her seizures are typically characterized as staring off.  There was question of possible nocturnal seizure last year when she was taking Ozempic, question possible nocturnal seizure described as chewing around 2 AM.  [name] level  was normal, she remained on [name] 100 mg twice daily.  She is overall doing well, has reported 1 or 2 episodes her husband describes as,  her making a loud noise, he couldn't wake her, when wearing CPAP, he went back to sleep. She has been trying to not eat before bed.  Recent A1c was 6.3.  She reports increase in stress.  She says when her husband notices these episodes, it it when she she is really tired, and not well rested.  She really does not want to go up on [name], she requires brand-name.  She presents today for evaluation unaccompanied.   Checked CPAP for equipment function, used for more than 4 hours, 29 out of 30 days, 97%, average usage (days used) 6 hours 44 minutes, set pressure 9 cmH2O, leak in the 95th percentile was 48.5, AHI 2.8.     REVIEW OF SYSTEMS: Out of a complete 14 system review of symptoms, the patient complains only of the following symptoms, and all other reviewed systems are negative.  ESS 9 ALLERGIES: Allergies  Allergen Reactions   Sulfa Antibiotics Itching    HOME MEDICATIONS: Outpatient Medications Prior to Visit  Medication Sig Dispense Refill   amLODipine (NORVASC) 2.5 MG tablet TAKE 1 TABLET(2.5 MG) BY MOUTH DAILY 90 tablet 1   anagrelide ([name]) 1 MG capsule TAKE 1 CAPSULE(1 MG) BY MOUTH DAILY 30 capsule 5   aspirin EC 81 MG tablet Take 81 mg by mouth daily. Swallow whole.     atorvastatin (LIPITOR) 40 MG tablet TAKE 1 TABLET(40 MG) BY MOUTH DAILY 90 tablet 3   blood glucose meter kit and supplies KIT Dispense based on patient and insurance preference. Use up to four times daily as directed. 1 each 0   Cholecalciferol (VITAMIN D3) 2000 UNITS capsule Take 2,000 Units by mouth daily.      escitalopram (LEXAPRO) 10 MG tablet Take 10 mg by mouth as needed.     Lacosamide 150 MG TABS TAKE 1 TABLET(150 MG) BY MOUTH TWICE DAILY 60 tablet 5   Magnesium 250 MG TABS Take 250 mg by mouth daily.     metoprolol tartrate (LOPRESSOR) 100 MG tablet Take 1 tablet (100 mg total) by mouth 2 (two) times daily. 180 tablet 3   Multiple Vitamin (MULTIVITAMIN) capsule Take 1 capsule by mouth daily.     telmisartan (MICARDIS) 80 MG tablet Take 1 tablet (80 mg total) by mouth daily. 90 tablet 1   No facility-administered medications prior to visit.    PAST MEDICAL HISTORY: Past Medical History:  Diagnosis Date   Anxiety    Blood dyscrasia    Chronic kidney disease    Gout    Hyperlipemia    Hypertension    LVH (left ventricular hypertrophy)    12/21/09 Echo -mild asymmetric LVH,EF =>55%   Morbid obesity (HCC)    Myocardial infarction (HCC)    OSA (obstructive sleep apnea)    PAD (peripheral artery disease) ([place])    Pneumonia    Pre-diabetes    Renal artery stenosis ([place]) 12/13/2010   PTCA and Stent -  right   Seizures (HCC)    Syncopal episodes    Thrombocytosis     PAST SURGICAL HISTORY: Past Surgical History:  Procedure Laterality Date   BREAST CYST EXCISION     S/P Benign Right Breast Cyst Removal.   BREAST REDUCTION SURGERY  1995   S/P Bilateral breast reduction   [place], 1986   Times  Two.   CORONARY ARTERY BYPASS GRAFT N/A 04/21/2017   Procedure: CORONARY ARTERY BYPASS GRAFTING (CABG) x 2, USING LEFT MAMMARY ARTERY AND RIGHT GREATER SAPHENOUS VEIN HARVESTED  ENDOSCOPICALLY;  Surgeon: [name], MD;  Location: [facility] OR;  Service: Open Heart Surgery;  Laterality: N/A;   EXTREMITY CYST EXCISION  1993   left wrist   LEFT HEART CATH AND CORONARY ANGIOGRAPHY N/A 04/17/2017   Procedure: Left Heart Cath and Coronary Angiography;  Surgeon: Jettie Booze, MD;  Location: [place] [facility];  Service: Cardiovascular;  Laterality: N/A;   PARTIAL HYSTERECTOMY  1994   renal artery stent placement Right    TEE WITHOUT CARDIOVERSION N/A 04/21/2017   Procedure: TRANSESOPHAGEAL ECHOCARDIOGRAM (TEE);  Surgeon: [name], MD;  Location: [place];  Service: Open Heart Surgery;  Laterality: N/A;   VENTRAL HERNIA REPAIR N/A 06/17/2021   Procedure: OPEN VENTRAL HERNIA REPAIR WITH MESH;  Surgeon: Kinsinger, Arta Bruce, MD;  Location: [facility] ORS;  Service: General;  Laterality: N/A;    FAMILY HISTORY: Family History  Problem Relation Age of Onset   Heart failure Mother    Diabetes Father    Diabetes Sister    Hypertension Sister    Hypertension Sister    Hypertension Sister    Breast cancer Maternal Aunt        2 maternal aunts had breast cancer.   Breast cancer Maternal Grandmother    Sleep apnea Neg Hx     SOCIAL HISTORY: Social History   Socioeconomic History   Marital status: Married    Spouse name: Juanda Crumble   Number of children: 2   Years of education: 16   Highest education level: Not on file  Occupational History   Occupation: TRANSPORTATION COOR.     Employer: [employer]  Tobacco Use   Smoking status: Never   Smokeless tobacco: Never  Vaping Use   Vaping Use: Never used  Substance and Sexual Activity   Alcohol use: No    Alcohol/week: 0.0 standard drinks of alcohol   Drug use: No   Sexual activity: Yes  Other Topics Concern   Not on file  Social History Narrative   Patient lives at husband and her son, home with family.   Patient has two adult children.   Patient is not drinking any caffeine.   Patient is working full-time.   Patient has a college education.   Patient is right-handed.   Social Determinants of Health   Financial Resource Strain: Low Risk  (11/11/2021)   Overall Financial Resource Strain (CARDIA)    Difficulty of Paying Living Expenses: Not hard at all  Food Insecurity: No Food Insecurity (09/27/2022)   Hunger Vital Sign    Worried About Running Out of Food in the Last Year: Never true    Ran Out of Food in the Last Year: Never true  Transportation Needs: No Transportation Needs (09/27/2022)   PRAPARE - Hydrologist (Medical): No    Lack of Transportation (Non-Medical): No  Physical Activity: Insufficiently Active (11/11/2021)   Exercise Vital Sign    Days of Exercise per Week: 3 days    Minutes of Exercise per Session: 30 min  Stress: Stress Concern Present (11/11/2021)   [place]    Feeling of Stress : To some extent  Social Connections: Socially Integrated (11/04/2020)   Social Connection and Isolation Panel [NHANES]    Frequency of Communication with Friends and Family: More than three times a week    Frequency of Social Gatherings with Friends and Family: Not on file    Attends Religious Services: More than  4 times per year    Active Member of Clubs or Organizations: Yes    Attends Archivist Meetings: More than 4 times per year    Marital Status: Married  Human resources officer Violence: Not  At Risk (11/04/2020)   Humiliation, Afraid, Rape, and Kick questionnaire    Fear of Current or Ex-Partner: No    Emotionally Abused: No    Physically Abused: No    Sexually Abused: No      PHYSICAL EXAM  Vitals:   10/03/22 0826  BP: (!) 156/86  Pulse: 72  Weight: 166 lb 9.6 oz (75.6 kg)  Height: _0  (1.549 m)   Body mass index is 31.48 kg/m.  Generalized: Well developed, in no acute distress   Neurological examination  Mentation: Alert oriented to time, place, history taking. Follows all commands speech and language fluent Cranial nerve II-XII: Pupils were equal round reactive to light. Extraocular movements were full, visual field were full on confrontational test.. Head turning and shoulder shrug  were normal and symmetric. Motor: The motor testing reveals 5 over 5 strength of all 4 extremities. Good symmetric motor tone is noted throughout.  Sensory: Sensory testing is intact to soft touch on all 4 extremities. No evidence of extinction is noted.  Coordination: Cerebellar testing reveals good finger-nose-finger and heel-to-shin bilaterally.  Gait and station: Gait is normal.   Reflexes: Deep tendon reflexes are symmetric and normal bilaterally.   DIAGNOSTIC DATA (LABS, IMAGING, TESTING) - I reviewed patient records, labs, notes, testing and imaging myself where available.  Lab Results  Component Value Date   WBC 5.6 07/29/2022   HGB 14.3 07/29/2022   HCT 42.4 07/29/2022   MCV 91.8 07/29/2022   PLT 379 07/29/2022      Component Value Date/Time   NA 139 03/04/2022 1447   NA 138 02/04/2021 1449   NA 139 08/11/2017 1528   K 4.5 03/04/2022 1447   K 4.2 08/11/2017 1528   CL 105 03/04/2022 1447   CL 102 03/19/2013 1535   CO2 31 03/04/2022 1447   CO2 27 08/11/2017 1528   GLUCOSE 96 03/04/2022 1447   GLUCOSE 93 08/11/2017 1528   GLUCOSE 117 (H) 03/19/2013 1535   BUN 25 (H) 03/04/2022 1447   BUN 12 02/04/2021 1449   BUN 15.0 08/11/2017 1528   CREATININE 1.26 (H)  03/04/2022 1447   CREATININE 1.1 08/11/2017 1528   CALCIUM 9.6 03/04/2022 1447   CALCIUM 9.2 08/11/2017 1528   PROT 6.5 03/04/2022 1447   PROT 7.0 01/14/2021 1521   PROT 7.0 08/11/2017 1528   ALBUMIN 3.7 03/04/2022 1447   ALBUMIN 4.3 01/14/2021 1521   ALBUMIN 3.6 08/11/2017 1528   AST 20 03/04/2022 1447   AST 27 08/11/2017 1528   ALT 29 03/04/2022 1447   ALT 26 08/11/2017 1528   ALKPHOS 77 03/04/2022 1447   ALKPHOS 88 08/11/2017 1528   BILITOT 0.5 03/04/2022 1447   BILITOT 0.37 08/11/2017 1528   GFRNONAA 47 (L) 03/04/2022 1447   GFRAA 49 (L) 11/16/2020 1209   Lab Results  Component Value Date   CHOL CANCELED 04/30/2022   HDL CANCELED 04/30/2022   LDLCALC 54 11/04/2020   TRIG CANCELED 04/30/2022   CHOLHDL 2.4 11/04/2020   Lab Results  Component Value Date   HGBA1C 5.9 (H) 04/30/2022   Lab Results  Component Value Date   VITAMINB12 924 03/13/2019   Lab Results  Component Value Date   TSH 0.845 01/22/2021  ASSESSMENT AND PLAN 66 y.o. year old [name]  has a past medical history of Anxiety, Blood dyscrasia, Chronic kidney disease, Gout, Hyperlipemia, Hypertension, LVH (left ventricular hypertrophy), Morbid obesity ([place]), Myocardial infarction ([place]), OSA (obstructive sleep apnea), PAD (peripheral artery disease) ([place]), Pneumonia, Pre-diabetes, Renal artery stenosis ([place]) (12/13/2010), Seizures ([place]), Syncopal episodes, and Thrombocytosis. here with:   1.  Seizures  Continue [name] 150 mg twice a day Advised if she has any seizure event she should let us know  2.  Obstructive sleep apnea  Continue using dental device  Follow-up in 1 year or sooner if needed    Ward Givens, MSN, NP-C 10/03/2022, 8:30 AM [facility] [address], [place], [place] [address] [phone]

## 2022-10-03 NOTE — Patient Instructions (Signed)
Your Plan:  Continue [name]  Continue using dental device     Thank you for coming to see [place] at [facility]. I hope we have been able to provide you high quality care today.  You may receive a patient satisfaction survey over the next few weeks. We would appreciate your feedback and comments so that we may continue to improve ourselves and the health of our patients.

## 2022-10-10 ENCOUNTER — Encounter: Payer: Self-pay | Admitting: Internal Medicine

## 2022-10-10 ENCOUNTER — Ambulatory Visit (INDEPENDENT_AMBULATORY_CARE_PROVIDER_SITE_OTHER): Payer: Medicare PPO | Admitting: Internal Medicine

## 2022-10-10 VITALS — BP 136/80 | HR 71 | Temp 98.4°F | Ht 61.2 in | Wt 161.0 lb

## 2022-10-10 DIAGNOSIS — Z1211 Encounter for screening for malignant neoplasm of colon: Secondary | ICD-10-CM | POA: Diagnosis not present

## 2022-10-10 DIAGNOSIS — E1122 Type 2 diabetes mellitus with diabetic chronic kidney disease: Secondary | ICD-10-CM | POA: Diagnosis not present

## 2022-10-10 DIAGNOSIS — Z23 Encounter for immunization: Secondary | ICD-10-CM

## 2022-10-10 DIAGNOSIS — N1831 Chronic kidney disease, stage 3a: Secondary | ICD-10-CM

## 2022-10-10 DIAGNOSIS — I1310 Hypertensive heart and chronic kidney disease without heart failure, with stage 1 through stage 4 chronic kidney disease, or unspecified chronic kidney disease: Secondary | ICD-10-CM | POA: Diagnosis not present

## 2022-10-10 DIAGNOSIS — E6609 Other obesity due to excess calories: Secondary | ICD-10-CM

## 2022-10-10 DIAGNOSIS — Z6830 Body mass index (BMI) 30.0-30.9, adult: Secondary | ICD-10-CM | POA: Diagnosis not present

## 2022-10-10 NOTE — Progress Notes (Signed)
Sara [name],acting as a Education administrator for Sara Greenland, MD.,have documented all relevant documentation on the behalf of Sara Greenland, MD,as directed by  Sara Greenland, MD while in the presence of Sara Greenland, MD.    Subjective:     Patient ID: Sara [name] , female    DOB: August 09, 1956 , 66 y.o.   MRN: [id number]   Chief Complaint  Patient presents with   Hypertension   Diabetes    HPI  She is here today for DM/HTN f/u.  She denies having headaches, chest pain and shortness of breath.  She is currently taking telmisartan, amlodipine and metoprolol BID. She takes the amlodipine at night because she states it makes her lightheaded. She does report compliance.   Hypertension This is a chronic problem. The current episode started more than 1 year ago. The problem has been gradually improving since onset. The problem is uncontrolled. Pertinent negatives include no blurred vision, chest pain, palpitations or shortness of breath. The current treatment provides moderate improvement. Compliance problems include exercise.  Hypertensive end-organ damage includes CAD/MI.  Diabetes She presents for her follow-up diabetic visit. She has type 2 diabetes mellitus. There are no hypoglycemic associated symptoms. There are no diabetic associated symptoms. Pertinent negatives for diabetes include no blurred vision, no chest pain, no polydipsia, no polyphagia and no polyuria. There are no hypoglycemic complications. Diabetic complications include heart disease and nephropathy. Risk factors for coronary artery disease include diabetes mellitus, dyslipidemia, hypertension, obesity, post-menopausal and sedentary lifestyle. She is compliant with treatment most of the time. She is following a diabetic diet. She participates in exercise three times a week. Her breakfast blood glucose is taken between 7-8 am. Her breakfast blood glucose range is generally 90-110 mg/dl. An ACE inhibitor/angiotensin II  receptor blocker is being taken. Eye exam is current.     Past Medical History:  Diagnosis Date   Anxiety    Blood dyscrasia    Chronic kidney disease    Gout    Hyperlipemia    Hypertension    LVH (left ventricular hypertrophy)    12/21/09 Echo -mild asymmetric LVH,EF =>55%   Morbid obesity (HCC)    Myocardial infarction (HCC)    OSA (obstructive sleep apnea)    PAD (peripheral artery disease) (HCC)    Pneumonia    Pre-diabetes    Renal artery stenosis ([place]) 12/13/2010   PTCA and Stent - right   Seizures (HCC)    Syncopal episodes    Thrombocytosis      Family History  Problem Relation Age of Onset   Heart failure Mother    Diabetes Father    Diabetes Sister    Hypertension Sister    Hypertension Sister    Hypertension Sister    Breast cancer Maternal Aunt        2 maternal aunts had breast cancer.   Breast cancer Maternal Grandmother    Sleep apnea Neg Hx      Current Outpatient Medications:    amLODipine (NORVASC) 2.5 MG tablet, TAKE 1 TABLET(2.5 MG) BY MOUTH DAILY, Disp: 90 tablet, Rfl: 1   anagrelide ([name]) 1 MG capsule, TAKE 1 CAPSULE(1 MG) BY MOUTH DAILY, Disp: 30 capsule, Rfl: 5   aspirin EC 81 MG tablet, Take 81 mg by mouth daily. Swallow whole., Disp: , Rfl:    atorvastatin (LIPITOR) 40 MG tablet, TAKE 1 TABLET(40 MG) BY MOUTH DAILY, Disp: 90 tablet, Rfl: 3   blood glucose meter kit and supplies KIT,  Dispense based on patient and insurance preference. Use up to four times daily as directed., Disp: 1 each, Rfl: 0   Cholecalciferol (VITAMIN D3) 2000 UNITS capsule, Take 2,000 Units by mouth daily. , Disp: , Rfl:    escitalopram (LEXAPRO) 10 MG tablet, Take 10 mg by mouth as needed., Disp: , Rfl:    Lacosamide 150 MG TABS, TAKE 1 TABLET(150 MG) BY MOUTH TWICE DAILY, Disp: 60 tablet, Rfl: 5   Magnesium 250 MG TABS, Take 250 mg by mouth daily., Disp: , Rfl:    metoprolol tartrate (LOPRESSOR) 100 MG tablet, Take 1 tablet (100 mg total) by mouth 2 (two) times  daily., Disp: 180 tablet, Rfl: 3   Multiple Vitamin (MULTIVITAMIN) capsule, Take 1 capsule by mouth daily., Disp: , Rfl:    telmisartan (MICARDIS) 80 MG tablet, Take 1 tablet (80 mg total) by mouth daily., Disp: 90 tablet, Rfl: 1   Allergies  Allergen Reactions   Sulfa Antibiotics Itching     Review of Systems  Constitutional: Negative.   Eyes: Negative.  Negative for blurred vision.  Respiratory: Negative.  Negative for shortness of breath.   Cardiovascular: Negative.  Negative for chest pain and palpitations.  Endocrine: Negative for polydipsia, polyphagia and polyuria.  Musculoskeletal: Negative.   Skin: Negative.   Neurological: Negative.   Psychiatric/Behavioral: Negative.       Today's Vitals   10/10/22 0824 10/10/22 0842  BP: (!) 140/80 136/80  Pulse: 71   Temp: 98.4 F (36.9 C)   Weight: 161 lb (73 kg)   Height: 5' 1.2" (1.554 m)   PainSc: 0-No pain    Body mass index is 30.22 kg/m.  Wt Readings from Last 3 Encounters:  10/10/22 161 lb (73 kg)  10/03/22 166 lb 9.6 oz (75.6 kg)  09/27/22 164 lb (74.4 kg)    BP Readings from Last 3 Encounters:  10/10/22 136/80  10/03/22 (!) 156/86  08/03/22 (!) 142/80     Objective:  Physical Exam Vitals and nursing note reviewed.  Constitutional:      Appearance: Normal appearance.  HENT:     Head: Normocephalic and atraumatic.     Nose:     Comments: Masked     Mouth/Throat:     Comments: Masked  Eyes:     Extraocular Movements: Extraocular movements intact.  Cardiovascular:     Rate and Rhythm: Normal rate and regular rhythm.     Heart sounds: Normal heart sounds.  Pulmonary:     Effort: Pulmonary effort is normal.     Breath sounds: Normal breath sounds.  Musculoskeletal:     Cervical back: Normal range of motion.  Skin:    General: Skin is warm.  Neurological:     General: No focal deficit present.     Mental Status: She is alert.  Psychiatric:        Mood and Affect: Mood normal.        Behavior:  Behavior normal.       Assessment And Plan:     1. Hypertensive heart and renal disease with renal failure, stage 1 through stage 4 or unspecified chronic kidney disease, without heart failure Comments: Repeat BP improved, down to 136/80. She will c/w current meds for now. She agrees to check BP every evening and send results to me. - CMP14+EGFR - Lipid panel  2. Type 2 diabetes mellitus with stage 3a chronic kidney disease, without long-term current use of insulin (HCC) Comments: Chronic, not on any meds. Well controlled  with lifestyle changes. She will rto in four to six months for re-evaluation. - CMP14+EGFR - Lipid panel - Hemoglobin A1c  3. Class 1 obesity due to excess calories with serious comorbidity and body mass index (BMI) of 30.0 to 30.9 in adult Comments: She was congratulated on her weight loss, she is currently participating in the PREP program.  4. Immunization due - Flu Vaccine QUAD High Dose(Fluad)  5. Screening for colon cancer Comments: Previous procedure performed at [facility]. I will refer her back for colonoscopy. - Ambulatory referral to Gastroenterology   Patient was given opportunity to ask questions. Patient verbalized understanding of the plan and was able to repeat key elements of the plan. All questions were answered to their satisfaction.   I, Sara Greenland, MD, have reviewed all documentation for this visit. The documentation on 10/10/22 for the exam, diagnosis, procedures, and orders are all accurate and complete.   IF YOU HAVE BEEN REFERRED TO A SPECIALIST, IT MAY TAKE 1-2 WEEKS TO SCHEDULE/PROCESS THE REFERRAL. IF YOU HAVE NOT HEARD FROM US/SPECIALIST IN TWO WEEKS, PLEASE GIVE [place] A CALL AT [phone].   THE PATIENT IS ENCOURAGED TO PRACTICE SOCIAL DISTANCING DUE TO THE COVID-19 PANDEMIC.

## 2022-10-10 NOTE — Patient Instructions (Signed)
Hypertension, Adult ?Hypertension is another name for high blood pressure. High blood pressure forces your heart to work harder to pump blood. This can cause problems over time. ?There are two numbers in a blood pressure reading. There is a top number (systolic) over a bottom number (diastolic). It is best to have a blood pressure that is below 120/80. ?What are the causes? ?The cause of this condition is not known. Some other conditions can lead to high blood pressure. ?What increases the risk? ?Some lifestyle factors can make you more likely to develop high blood pressure: ?Smoking. ?Not getting enough exercise or physical activity. ?Being overweight. ?Having too much fat, sugar, calories, or salt (sodium) in your diet. ?Drinking too much alcohol. ?Other risk factors include: ?Having any of these conditions: ?Heart disease. ?Diabetes. ?High cholesterol. ?Kidney disease. ?Obstructive sleep apnea. ?Having a family history of high blood pressure and high cholesterol. ?Age. The risk increases with age. ?Stress. ?What are the signs or symptoms? ?High blood pressure may not cause symptoms. Very high blood pressure (hypertensive crisis) may cause: ?Headache. ?Fast or uneven heartbeats (palpitations). ?Shortness of breath. ?Nosebleed. ?Vomiting or feeling like you may vomit (nauseous). ?Changes in how you see. ?Very bad chest pain. ?Feeling dizzy. ?Seizures. ?How is this treated? ?This condition is treated by making healthy lifestyle changes, such as: ?Eating healthy foods. ?Exercising more. ?Drinking less alcohol. ?Your doctor may prescribe medicine if lifestyle changes do not help enough and if: ?Your top number is above 130. ?Your bottom number is above 80. ?Your personal target blood pressure may vary. ?Follow these instructions at home: ?Eating and drinking ? ?If told, follow the DASH eating plan. To follow this plan: ?Fill one half of your plate at each meal with fruits and vegetables. ?Fill one fourth of your plate  at each meal with whole grains. Whole grains include whole-wheat pasta, brown rice, and whole-grain bread. ?Eat or drink low-fat dairy products, such as skim milk or low-fat yogurt. ?Fill one fourth of your plate at each meal with low-fat (lean) proteins. Low-fat proteins include fish, chicken without skin, eggs, beans, and tofu. ?Avoid fatty meat, cured and processed meat, or chicken with skin. ?Avoid pre-made or processed food. ?Limit the amount of salt in your diet to less than 1,500 mg each day. ?Do not drink alcohol if: ?Your doctor tells you not to drink. ?You are pregnant, may be pregnant, or are planning to become pregnant. ?If you drink alcohol: ?Limit how much you have to: ?0-1 drink a day for women. ?0-2 drinks a day for men. ?Know how much alcohol is in your drink. In the [place] one drink equals one 12 oz bottle of beer (355 mL), one 5 oz glass of wine (148 mL), or one 1? oz glass of hard liquor (44 mL). ?Lifestyle ? ?Work with your doctor to stay at a healthy weight or to lose weight. Ask your doctor what the best weight is for you. ?Get at least 30 minutes of exercise that causes your heart to beat faster (aerobic exercise) most days of the week. This may include walking, swimming, or biking. ?Get at least 30 minutes of exercise that strengthens your muscles (resistance exercise) at least 3 days a week. This may include lifting weights or doing Pilates. ?Do not smoke or use any products that contain nicotine or tobacco. If you need help quitting, ask your doctor. ?Check your blood pressure at home as told by your doctor. ?Keep all follow-up visits. ?Medicines ?Take over-the-counter and prescription medicines   only as told by your doctor. Follow directions carefully. ?Do not skip doses of blood pressure medicine. The medicine does not work as well if you skip doses. Skipping doses also puts you at risk for problems. ?Ask your doctor about side effects or reactions to medicines that you should watch  for. ?Contact a doctor if: ?You think you are having a reaction to the medicine you are taking. ?You have headaches that keep coming back. ?You feel dizzy. ?You have swelling in your ankles. ?You have trouble with your vision. ?Get help right away if: ?You get a very bad headache. ?You start to feel mixed up (confused). ?You feel weak or numb. ?You feel faint. ?You have very bad pain in your: ?Chest. ?Belly (abdomen). ?You vomit more than once. ?You have trouble breathing. ?These symptoms may be an emergency. Get help right away. Call 911. ?Do not wait to see if the symptoms will go away. ?Do not drive yourself to the hospital. ?Summary ?Hypertension is another name for high blood pressure. ?High blood pressure forces your heart to work harder to pump blood. ?For most people, a normal blood pressure is less than 120/80. ?Making healthy choices can help lower blood pressure. If your blood pressure does not get lower with healthy choices, you may need to take medicine. ?This information is not intended to replace advice given to you by your health care provider. Make sure you discuss any questions you have with your health care provider. ?Document Revised: 07/29/2021 Document Reviewed: 07/29/2021 ?Elsevier Patient Education ? 2023 Elsevier Inc. ? ?

## 2022-10-11 LAB — CMP14+EGFR
ALT: 27 IU/L (ref 0–32)
AST: 24 IU/L (ref 0–40)
Albumin/Globulin Ratio: 1.5 (ref 1.2–2.2)
Albumin: 3.8 g/dL — ABNORMAL LOW (ref 3.9–4.9)
Alkaline Phosphatase: 96 IU/L (ref 44–121)
BUN/Creatinine Ratio: 13 (ref 12–28)
BUN: 16 mg/dL (ref 8–27)
Bilirubin Total: 0.4 mg/dL (ref 0.0–1.2)
CO2: 25 mmol/L (ref 20–29)
Calcium: 9.9 mg/dL (ref 8.7–10.3)
Chloride: 101 mmol/L (ref 96–106)
Creatinine, Ser: 1.2 mg/dL — ABNORMAL HIGH (ref 0.57–1.00)
Globulin, Total: 2.6 g/dL (ref 1.5–4.5)
Glucose: 76 mg/dL (ref 70–99)
Potassium: 4.8 mmol/L (ref 3.5–5.2)
Sodium: 138 mmol/L (ref 134–144)
Total Protein: 6.4 g/dL (ref 6.0–8.5)
eGFR: 49 mL/min/{1.73_m2} — ABNORMAL LOW (ref >59)

## 2022-10-11 LAB — LIPID PANEL
Chol/HDL Ratio: 2 ratio (ref 0.0–4.4)
Cholesterol, Total: 106 mg/dL (ref 100–199)
HDL: 54 mg/dL (ref >39)
LDL Chol Calc (NIH): 43 mg/dL (ref 0–99)
Triglycerides: 28 mg/dL (ref 0–149)
VLDL Cholesterol Cal: 9 mg/dL (ref 5–40)

## 2022-10-11 LAB — HEMOGLOBIN A1C
Est. average glucose Bld gHb Est-mCnc: 123 mg/dL
Hgb A1c MFr Bld: 5.9 % — ABNORMAL HIGH (ref 4.8–5.6)

## 2022-10-12 NOTE — Progress Notes (Signed)
[facility] PREP Weekly Session  Patient Details  Name: Sara [name] MRN: [id number] Date of Birth: [birth year] Age: 66 y.o. PCP: [name], MD  Vitals:   10/11/22 1300  Weight: 161 lb (73 kg)     [facility] Weekly seesion - 10/12/22 1100       [facility] "PREP" Location   [facility] "PREP" Location [facility]      Weekly Session   Topic Discussed Finding support    Classes attended to date [place] 10/12/2022, 11:27 AM

## 2022-10-18 NOTE — Progress Notes (Signed)
[facility] PREP Weekly Session  Patient Details  Name: Sara [name] MRN: [id number] Date of Birth: 1956/10/13 Age: 66 y.o. PCP: [name], MD  Vitals:   10/18/22 1300  Weight: 166 lb (75.3 kg)     [facility] Weekly seesion - 10/18/22 1600       [facility] "PREP" Location   [facility] "PREP" [facility]      Weekly Session   Topic Discussed Calorie breakdown    Minutes exercised this week --   walking   Classes attended to date [place] 10/18/2022, 4:22 PM

## 2022-10-26 DIAGNOSIS — Z86010 Personal history of colonic polyps: Secondary | ICD-10-CM | POA: Diagnosis not present

## 2022-10-26 DIAGNOSIS — I509 Heart failure, unspecified: Secondary | ICD-10-CM | POA: Diagnosis not present

## 2022-10-28 ENCOUNTER — Other Ambulatory Visit: Payer: Self-pay

## 2022-10-28 ENCOUNTER — Inpatient Hospital Stay: Payer: Medicare PPO | Attending: Nurse Practitioner

## 2022-10-28 ENCOUNTER — Encounter: Payer: Self-pay | Admitting: Hematology

## 2022-10-28 ENCOUNTER — Encounter: Payer: Self-pay | Admitting: Internal Medicine

## 2022-10-28 ENCOUNTER — Inpatient Hospital Stay (HOSPITAL_BASED_OUTPATIENT_CLINIC_OR_DEPARTMENT_OTHER): Payer: Medicare PPO | Admitting: Hematology

## 2022-10-28 VITALS — BP 140/80 | HR 61 | Temp 98.6°F | Resp 18 | Wt 162.4 lb

## 2022-10-28 DIAGNOSIS — D473 Essential (hemorrhagic) thrombocythemia: Secondary | ICD-10-CM | POA: Diagnosis not present

## 2022-10-28 DIAGNOSIS — Z0000 Encounter for general adult medical examination without abnormal findings: Secondary | ICD-10-CM

## 2022-10-28 LAB — CBC WITH DIFFERENTIAL/PLATELET
Abs Immature Granulocytes: 0.02 10*3/uL (ref 0.00–0.07)
Basophils Absolute: 0.1 10*3/uL (ref 0.0–0.1)
Basophils Relative: 1 %
Eosinophils Absolute: 0.2 10*3/uL (ref 0.0–0.5)
Eosinophils Relative: 3 %
HCT: 42.9 % (ref 36.0–46.0)
Hemoglobin: 14.3 g/dL (ref 12.0–15.0)
Immature Granulocytes: 0 %
Lymphocytes Relative: 19 %
Lymphs Abs: 1.2 10*3/uL (ref 0.7–4.0)
MCH: 30.2 pg (ref 26.0–34.0)
MCHC: 33.3 g/dL (ref 30.0–36.0)
MCV: 90.7 fL (ref 80.0–100.0)
Monocytes Absolute: 0.5 10*3/uL (ref 0.1–1.0)
Monocytes Relative: 8 %
Neutro Abs: 4.3 10*3/uL (ref 1.7–7.7)
Neutrophils Relative %: 69 %
Platelets: 460 10*3/uL — ABNORMAL HIGH (ref 150–400)
RBC: 4.73 MIL/uL (ref 3.87–5.11)
RDW: 13 % (ref 11.5–15.5)
WBC: 6.3 10*3/uL (ref 4.0–10.5)
nRBC: 0 % (ref 0.0–0.2)

## 2022-10-28 LAB — IRON AND IRON BINDING CAPACITY (CC-WL,HP ONLY)
Iron: 90 ug/dL (ref 28–170)
Saturation Ratios: 24 % (ref 10.4–31.8)
TIBC: 371 ug/dL (ref 250–450)
UIBC: 281 ug/dL (ref 148–442)

## 2022-10-28 NOTE — Assessment & Plan Note (Signed)
-  JAK2(+) --diagnosed in 2010, JAK2 mutation present. Bone marrow aspirate and biopsy on 01/20/09 was non-diagnostic. However, megakaryocytes were abundant with normal morphology. There was no clustering or excess blasts seen. Platelet count on 01/20/09 was 881k.  -started Hydrea in 2010, dose adjusted as needed. Switched to anagrelide in 07/2016 due to concern about risk of leukemia. She is currently on '1mg'$  daily, since 01/28/21, tolerating well with no noticeable side effects.

## 2022-10-28 NOTE — Progress Notes (Signed)
[place]   [phone] Fax:([phone]) [phone]   Clinic Follow up Note   Patient Care Team: [name], MD as PCP - General (Internal Medicine) Croitoru, Dani Gobble, MD as PCP - Cardiology (Cardiology) Mayford Knife, [facility] (Pharmacist) Lynne Logan, RN as [place] Management  Date of Service:  10/28/2022  CHIEF COMPLAINT: f/u of  essential thrombocythemia   CURRENT THERAPY: Anagrelide '1mg'$  daily, since 01/28/21    ASSESSMENT:  Sara [name] is a 67 y.o. female with   Essential thrombocythemia ([place]) -JAK2(+) --diagnosed in 2010, JAK2 mutation present. Bone marrow aspirate and biopsy on 01/20/09 was non-diagnostic. However, megakaryocytes were abundant with normal morphology. There was no clustering or excess blasts seen. Platelet count on 01/20/09 was 881k.  -started Hydrea in 2010, dose adjusted as needed. Switched to anagrelide in 07/2016 due to concern about risk of leukemia. She is currently on '1mg'$  daily, since 01/28/21, tolerating well with no noticeable side effects.    PLAN: - lab reviewed, CBC and iron level good -lab every 3 months -f/u in 6 months  -she will f/u with cardiology    SUMMARY OF ONCOLOGIC HISTORY: Oncology History   No history exists.     INTERVAL HISTORY:  Sara [name] is here for a follow up of essential thrombocythemia   She was last seen by me on 04/29/22 She presents to the clinic alone. Pt reports that she hasn't had a colonoscopy in 10 years. Pt denies stomach issues.Pt started to eat healthy, and has been monitoring her glucose.  Pt reports she has been going to the Gym and she loves it. Pt has not other concerns.     All other systems were reviewed with the patient and are negative.  MEDICAL HISTORY:  Past Medical History:  Diagnosis Date   Anxiety    Blood dyscrasia    Chronic kidney disease    Gout    Hyperlipemia    Hypertension    LVH (left ventricular hypertrophy)    12/21/09  Echo -mild asymmetric LVH,EF =>55%   Morbid obesity (HCC)    Myocardial infarction (HCC)    OSA (obstructive sleep apnea)    PAD (peripheral artery disease) ([place])    Pneumonia    Pre-diabetes    Renal artery stenosis ([place]) 12/13/2010   PTCA and Stent - right   Seizures (HCC)    Syncopal episodes    Thrombocytosis     SURGICAL HISTORY: Past Surgical History:  Procedure Laterality Date   BREAST CYST EXCISION     S/P Benign Right Breast Cyst Removal.   BREAST REDUCTION SURGERY  1995   S/P Bilateral breast reduction   [place], 1986   Times  Two.   CORONARY ARTERY BYPASS GRAFT N/A 04/21/2017   Procedure: CORONARY ARTERY BYPASS GRAFTING (CABG) x 2, USING LEFT MAMMARY ARTERY AND RIGHT GREATER SAPHENOUS VEIN HARVESTED ENDOSCOPICALLY;  Surgeon: [name], MD;  Location: [place];  Service: Open Heart Surgery;  Laterality: N/A;   EXTREMITY CYST EXCISION  1993   left wrist   LEFT HEART CATH AND CORONARY ANGIOGRAPHY N/A 04/17/2017   Procedure: Left Heart Cath and Coronary Angiography;  Surgeon: Jettie Booze, MD;  Location: [place] [facility];  Service: Cardiovascular;  Laterality: N/A;   PARTIAL HYSTERECTOMY  1994   renal artery stent placement Right    TEE WITHOUT CARDIOVERSION N/A 04/21/2017   Procedure: TRANSESOPHAGEAL ECHOCARDIOGRAM (TEE);  Surgeon: [name], MD;  Location:  [facility];  Service: Open Heart Surgery;  Laterality: N/A;   VENTRAL HERNIA REPAIR N/A 06/17/2021   Procedure: OPEN VENTRAL HERNIA REPAIR WITH MESH;  Surgeon: Kinsinger, Arta Bruce, MD;  Location: [facility];  Service: General;  Laterality: N/A;    I have reviewed the social history and family history with the patient and they are unchanged from previous note.  ALLERGIES:  is allergic to sulfa antibiotics.  MEDICATIONS:  Current Outpatient Medications  Medication Sig Dispense Refill   amLODipine (NORVASC) 2.5 MG tablet TAKE 1 TABLET(2.5 MG) BY MOUTH DAILY 90 tablet 1   anagrelide ([name])  1 MG capsule TAKE 1 CAPSULE(1 MG) BY MOUTH DAILY 30 capsule 5   aspirin EC 81 MG tablet Take 81 mg by mouth daily. Swallow whole.     atorvastatin (LIPITOR) 40 MG tablet TAKE 1 TABLET(40 MG) BY MOUTH DAILY 90 tablet 3   blood glucose meter kit and supplies KIT Dispense based on patient and insurance preference. Use up to four times daily as directed. 1 each 0   Cholecalciferol (VITAMIN D3) 2000 UNITS capsule Take 2,000 Units by mouth daily.      escitalopram (LEXAPRO) 10 MG tablet Take 10 mg by mouth as needed.     Lacosamide 150 MG TABS TAKE 1 TABLET(150 MG) BY MOUTH TWICE DAILY 60 tablet 5   Magnesium 250 MG TABS Take 250 mg by mouth daily.     metoprolol tartrate (LOPRESSOR) 100 MG tablet Take 1 tablet (100 mg total) by mouth 2 (two) times daily. 180 tablet 3   Multiple Vitamin (MULTIVITAMIN) capsule Take 1 capsule by mouth daily.     telmisartan (MICARDIS) 80 MG tablet Take 1 tablet (80 mg total) by mouth daily. 90 tablet 1   No current facility-administered medications for this visit.    PHYSICAL EXAMINATION: ECOG PERFORMANCE STATUS: 0 - Asymptomatic  Vitals:   10/28/22 1408  BP: (!) 140/80  Pulse: 61  Resp: 18  Temp: 98.6 F (37 C)  SpO2: 100%   Wt Readings from Last 3 Encounters:  10/28/22 162 lb 6.4 oz (73.7 kg)  10/18/22 166 lb (75.3 kg)  10/11/22 161 lb (73 kg)     GENERAL:alert, no distress and comfortable SKIN: skin color normal, no rashes or significant lesions EYES: normal, Conjunctiva are pink and non-injected, sclera clear  NEURO: alert & oriented x 3 with fluent speech  LABORATORY DATA:  I have reviewed the data as listed    Latest Ref Rng & Units 10/28/2022   12:51 PM 07/29/2022    8:24 AM 04/29/2022   12:40 PM  CBC  WBC 4.0 - 10.5 K/uL 6.3  5.6  6.1   Hemoglobin 12.0 - 15.0 g/dL 14.3  14.3  14.1   Hematocrit 36.0 - 46.0 % 42.9  42.4  41.1   Platelets 150 - 400 K/uL 460  379  468         Latest Ref Rng & Units 10/10/2022    9:43 AM 03/04/2022     2:47 PM 11/02/2021   10:15 AM  CMP  Glucose 70 - 99 mg/dL 76  96  77   BUN 8 - 27 mg/dL '16  25  22   '$ Creatinine 0.57 - 1.00 mg/dL 1.20  1.26  1.28   Sodium 134 - 144 mmol/L 138  139  137   Potassium 3.5 - 5.2 mmol/L 4.8  4.5  4.0   Chloride 96 - 106 mmol/L 101  105  102   CO2  20 - 29 mmol/L '25  31  29   '$ Calcium 8.7 - 10.3 mg/dL 9.9  9.6  9.9   Total Protein 6.0 - 8.5 g/dL 6.4  6.5  6.9   Total Bilirubin 0.0 - 1.2 mg/dL 0.4  0.5  0.6   Alkaline Phos 44 - 121 IU/L 96  77  96   AST 0 - 40 IU/L '24  20  29   '$ ALT 0 - 32 IU/L '27  29  31       '$ RADIOGRAPHIC STUDIES: I have personally reviewed the radiological images as listed and agreed with the findings in the report. No results found.    No orders of the defined types were placed in this encounter.  All questions were answered. The patient knows to call the clinic with any problems, questions or concerns. No barriers to learning was detected. The total time spent in the appointment was 15 minutes.     Truitt Merle, MD 10/28/2022   Felicity Coyer, CMA, am acting as scribe for Truitt Merle, MD.   I have reviewed the above documentation for accuracy and completeness, and I agree with the above.

## 2022-11-09 ENCOUNTER — Ambulatory Visit: Payer: Self-pay

## 2022-11-09 NOTE — Patient Outreach (Signed)
  Care Coordination   Follow Up Visit Note   11/09/2022 Name: Sara [name] MRN: [id number] DOB: January 13, 1956  Sara [name] is a 67 y.o. year old female who sees [name], MD for primary care. I spoke with  Sara [name] by phone today.  What matters to the patients health and wellness today?  Patient is concerned about having persistent elevated BP readings.     Goals Addressed               This Visit's Progress     Patient Stated     I want to keep my blood pressure under good control (pt-stated)        Care Coordination Interventions: Evaluation of current treatment plan related to hypertension self management and patient's adherence to plan as established by provider Reviewed medications with patient and discussed importance of compliance Counseled on the importance of exercise goals with target of 150 minutes per week Advised patient, providing education and rationale, to monitor blood pressure daily and record, calling PCP for findings outside established parameters Provided education on prescribed diet low Sodium Determined patient is concerned about having persistent elevated BP >140/80 Sent in basket message to PCP, Dr. Baird Cancer regarding patient's reported elevated BP         I would like to lose weight (pt-stated)        Care Coordination Interventions: Determined patient is participating in the [place] program, she is really enjoying the program and will graduate over the coming weeks Educated patient regarding current BMI; educated on goal BMI to help improve overall health Positive reinforcement given to patient for making efforts to improve her health         SDOH assessments and interventions completed:  No     Care Coordination Interventions:  Yes, provided   Follow up plan: Follow up call scheduled for 12/08/22 '@11'$ :00 AM    Encounter Outcome:  Pt. Visit Completed

## 2022-11-09 NOTE — Patient Instructions (Signed)
Visit Information  Thank you for taking time to visit with me today. Please don't hesitate to contact me if I can be of assistance to you.   Following are the goals we discussed today:   Goals Addressed               This Visit's Progress     Patient Stated     I want to keep my blood pressure under good control (pt-stated)        Care Coordination Interventions: Evaluation of current treatment plan related to hypertension self management and patient's adherence to plan as established by provider Reviewed medications with patient and discussed importance of compliance Counseled on the importance of exercise goals with target of 150 minutes per week Advised patient, providing education and rationale, to monitor blood pressure daily and record, calling PCP for findings outside established parameters Provided education on prescribed diet low Sodium Determined patient is concerned about having persistent elevated BP >140/80 Sent in basket message to PCP, Dr. Baird Cancer regarding patient's reported elevated BP         I would like to lose weight (pt-stated)        Care Coordination Interventions: Determined patient is participating in the [place] program, she is really enjoying the program and will graduate over the coming weeks Educated patient regarding current BMI; educated on goal BMI to help improve overall health Positive reinforcement given to patient for making efforts to improve her health         Our next appointment is by telephone on 12/08/22 at 11:00 AM  Please call the care guide team at [phone] if you need to cancel or reschedule your appointment.   If you are experiencing a Mental Health or [place] or need someone to talk to, please go to [facility] [address], [place] [phone])  Patient verbalizes understanding of instructions and care plan provided today and agrees to view in [place]. Active  MyChart status and patient understanding of how to access instructions and care plan via MyChart confirmed with patient.     Barb Merino, RN, BSN, CCM Care Management Coordinator [facility] Care Management  Direct Phone: [phone]

## 2022-11-10 ENCOUNTER — Encounter: Payer: Self-pay | Admitting: Internal Medicine

## 2022-11-16 DIAGNOSIS — F331 Major depressive disorder, recurrent, moderate: Secondary | ICD-10-CM | POA: Diagnosis not present

## 2022-11-17 ENCOUNTER — Ambulatory Visit (INDEPENDENT_AMBULATORY_CARE_PROVIDER_SITE_OTHER): Payer: Medicare PPO

## 2022-11-17 VITALS — BP 126/62 | HR 76 | Temp 98.3°F | Ht 60.0 in | Wt 162.6 lb

## 2022-11-17 DIAGNOSIS — E119 Type 2 diabetes mellitus without complications: Secondary | ICD-10-CM | POA: Diagnosis not present

## 2022-11-17 DIAGNOSIS — I2510 Atherosclerotic heart disease of native coronary artery without angina pectoris: Secondary | ICD-10-CM | POA: Diagnosis not present

## 2022-11-17 DIAGNOSIS — F411 Generalized anxiety disorder: Secondary | ICD-10-CM | POA: Diagnosis not present

## 2022-11-17 DIAGNOSIS — M1990 Unspecified osteoarthritis, unspecified site: Secondary | ICD-10-CM | POA: Diagnosis not present

## 2022-11-17 DIAGNOSIS — E785 Hyperlipidemia, unspecified: Secondary | ICD-10-CM | POA: Diagnosis not present

## 2022-11-17 DIAGNOSIS — E669 Obesity, unspecified: Secondary | ICD-10-CM | POA: Diagnosis not present

## 2022-11-17 DIAGNOSIS — Z0000 Encounter for general adult medical examination without abnormal findings: Secondary | ICD-10-CM | POA: Diagnosis not present

## 2022-11-17 DIAGNOSIS — I10 Essential (primary) hypertension: Secondary | ICD-10-CM | POA: Diagnosis not present

## 2022-11-17 DIAGNOSIS — G4733 Obstructive sleep apnea (adult) (pediatric): Secondary | ICD-10-CM | POA: Diagnosis not present

## 2022-11-17 DIAGNOSIS — G40909 Epilepsy, unspecified, not intractable, without status epilepticus: Secondary | ICD-10-CM | POA: Diagnosis not present

## 2022-11-17 NOTE — Progress Notes (Signed)
[facility] PREP Evaluation  Patient Details  Name: Sara [name] MRN: [id number] Date of Birth: 1956/09/06 Age: 67 y.o. PCP: [name], MD  Vitals:   11/15/22 1330  BP: 122/74  Pulse: 75  SpO2: 96%  Weight: 160 lb 12.8 oz (72.9 kg)     [facility] Eval - 11/17/22 1600       [facility] "PREP" Location   [facility] "PREP" Location [facility]      Referral    Referring Provider [employer]    Program Start Date --   Program end date 11/15/22     Measurement   Waist Circumference 33.5 inches    Hip Circumference 39 inches    Body fat 43 percent      Information for Trainer   Goals Reset      Mobility and Daily Activities   I find it easy to walk up or down two or more flights of stairs. 4    I have no trouble taking out the trash. 4    I do housework such as vacuuming and dusting on my own without difficulty. 4    I can easily lift a gallon of milk (8lbs). 4    I can easily walk a mile. 3    I have no trouble reaching into high cupboards or reaching down to pick up something from the floor. 4    I do not have trouble doing out-door work such as Armed forces logistics/support/administrative officer, raking leaves, or gardening. 2      Mobility and Daily Activities   I feel younger than my age. 4    I feel independent. 4    I feel energetic. 3    I live an active life.  4    I feel strong. 4    I feel healthy. 3    I feel active as other people my age. 4      How fit and strong are you.   Fit and Strong Total Score 51            Past Medical History:  Diagnosis Date   Anxiety    Blood dyscrasia    Chronic kidney disease    Gout    Hyperlipemia    Hypertension    LVH (left ventricular hypertrophy)    12/21/09 Echo -mild asymmetric LVH,EF =>55%   Morbid obesity (HCC)    Myocardial infarction (HCC)    Nausea vomiting and diarrhea    OSA (obstructive sleep apnea)    PAD (peripheral artery disease) (HCC)    Pneumonia    Pre-diabetes    Renal artery stenosis ([place]) 12/13/2010   PTCA and Stent - right    Seizures (HCC)    Syncopal episodes    Thrombocytosis    Type 2 diabetes mellitus ([place]) 07/20/2013   Past Surgical History:  Procedure Laterality Date   BREAST CYST EXCISION     S/P Benign Right Breast Cyst Removal.   BREAST REDUCTION SURGERY  1995   S/P Bilateral breast reduction   [place], 1986   Times  Two.   CORONARY ARTERY BYPASS GRAFT N/A 04/21/2017   Procedure: CORONARY ARTERY BYPASS GRAFTING (CABG) x 2, USING LEFT MAMMARY ARTERY AND RIGHT GREATER SAPHENOUS VEIN HARVESTED ENDOSCOPICALLY;  Surgeon: [name], MD;  Location: [place];  Service: Open Heart Surgery;  Laterality: N/A;   EXTREMITY CYST EXCISION  1993   left wrist   LEFT HEART CATH AND CORONARY ANGIOGRAPHY N/A 04/17/2017  Procedure: Left Heart Cath and Coronary Angiography;  Surgeon: Jettie Booze, MD;  Location: [facility] [facility];  Service: Cardiovascular;  Laterality: N/A;   PARTIAL HYSTERECTOMY  1994   renal artery stent placement Right    TEE WITHOUT CARDIOVERSION N/A 04/21/2017   Procedure: TRANSESOPHAGEAL ECHOCARDIOGRAM (TEE);  Surgeon: [name], MD;  Location: [facility];  Service: Open Heart Surgery;  Laterality: N/A;   VENTRAL HERNIA REPAIR N/A 06/17/2021   Procedure: OPEN VENTRAL HERNIA REPAIR WITH MESH;  Surgeon: Kinsinger, Arta Bruce, MD;  [facility];  Service: General;  Laterality: N/A;   Social History   Tobacco Use  Smoking Status Never  Smokeless Tobacco Never  Attended 14 sessions, 7 of 12 educational sessions  Fit test: Cardio march test: 230 to 230 Sit to stand: 14 to 12 Bicep curl: 17 to 20 Balance a bit better for single leg. Encouraged to continue to exercise and do balance exercises daily  Barnett Hatter 11/17/2022, 4:09 PM

## 2022-11-17 NOTE — Patient Instructions (Addendum)
Sara [name] , Thank you for taking time to come for your Medicare Wellness Visit. I appreciate your ongoing commitment to your health goals. Please review the following plan we discussed and let me know if I can assist you in the future.   These are the goals we discussed:  Goals       Exercise 150 min/wk Moderate Activity      I want to keep my blood pressure under good control (pt-stated)      Care Coordination Interventions: Evaluation of current treatment plan related to hypertension self management and patient's adherence to plan as established by provider Reviewed medications with patient and discussed importance of compliance Counseled on the importance of exercise goals with target of 150 minutes per week Advised patient, providing education and rationale, to monitor blood pressure daily and record, calling PCP for findings outside established parameters Provided education on prescribed diet low Sodium Determined patient is concerned about having persistent elevated BP >140/80 Sent in basket message to PCP, Dr. Baird Cancer regarding patient's reported elevated BP         I would like to lose weight (pt-stated)      Care Coordination Interventions: Determined patient is participating in the PREP program, she is really enjoying the program and will graduate over the coming weeks Educated patient regarding current BMI; educated on goal BMI to help improve overall health Positive reinforcement given to patient for making efforts to improve her health       LCSW-Strengthen Support      Care Coordination Interventions: Solution-Focused Strategies employed:  Active listening / Reflection utilized  Emotional Support Provided Patient identified stressors that negatively impact mental health, including chronic health conditions of son and recent loss of nephew Stages of grief was discussed. Validation and encouragement provided Patient is motivated to continue with management of health  conditions noting she has loss substantial weight and participates in exercise classes at the [employer] skills discussed LCSW will follow up with PCP office regarding space to meet patient in clinic for future sessions      Patient Stated      11/11/2021, wants to maintain current weight      Patient Stated      11/17/2022, wants to keep weight in control and keep bp and blood sugar down      Track and Manage My Blood Pressure-Hypertension      Timeframe:  Long-Range Goal Priority:  High Start Date:                             Expected End Date:                       Follow Up Date 10/20/20222   In Process: - check blood pressure daily - choose a place to take my blood pressure (home, clinic or office, retail store) - write blood pressure results in a log or diary    Why is this important?   You won't feel high blood pressure, but it can still hurt your blood vessels.  High blood pressure can cause heart or kidney problems. It can also cause a stroke.  Making lifestyle changes like losing a little weight or eating less salt will help.  Checking your blood pressure at home and at different times of the day can help to control blood pressure.  If the doctor prescribes medicine remember to take it the way the  doctor ordered.  Call the office if you cannot afford the medicine or if there are questions about it.     Notes:  -Continue to follow up with PCP team and cardiologist.         This is a list of the screening recommended for you and due dates:  Health Maintenance  Topic Date Due   Complete foot exam   03/18/2022   Colon Cancer Screening  06/01/2022   COVID-19 Vaccine (4 - 2023-24 season) 06/24/2022   Hemoglobin A1C  04/11/2023   Eye exam for diabetics  06/10/2023   Yearly kidney health urinalysis for diabetes  07/15/2023   Yearly kidney function blood test for diabetes  10/11/2023   Medicare Annual Wellness Visit  11/18/2023   Mammogram  05/20/2024    DTaP/Tdap/Td vaccine (2 - Td or Tdap) 03/12/2029   Pneumonia Vaccine  Completed   Flu Shot  Completed   DEXA scan (bone density measurement)  Completed   Hepatitis C Screening: USPSTF Recommendation to screen - Ages 65-79 yo.  Completed   Zoster (Shingles) Vaccine  Completed   HPV Vaccine  Aged Out    Advanced directives: Advance directive discussed with you today. Even though you declined this today please call our office should you change your mind and we can give you the proper paperwork for you to fill out.  Conditions/risks identified: none  Next appointment: Follow up in one year for your annual wellness visit 1   Preventive Care 65 Years and Older, Female Preventive care refers to lifestyle choices and visits with your health care provider that can promote health and wellness. What does preventive care include? A yearly physical exam. This is also called an annual well check. Dental exams once or twice a year. Routine eye exams. Ask your health care provider how often you should have your eyes checked. Personal lifestyle choices, including: Daily care of your teeth and gums. Regular physical activity. Eating a healthy diet. Avoiding tobacco and drug use. Limiting alcohol use. Practicing safe sex. Taking low-dose aspirin every day. Taking vitamin and mineral supplements as recommended by your health care provider. What happens during an annual well check? The services and screenings done by your health care provider during your annual well check will depend on your age, overall health, lifestyle risk factors, and family history of disease. Counseling  Your health care provider may ask you questions about your: Alcohol use. Tobacco use. Drug use. Emotional well-being. Home and relationship well-being. Sexual activity. Eating habits. History of falls. Memory and ability to understand (cognition). Work and work Statistician. Reproductive health. Screening  You may  have the following tests or measurements: Height, weight, and BMI. Blood pressure. Lipid and cholesterol levels. These may be checked every 5 years, or more frequently if you are over 36 years old. Skin check. Lung cancer screening. You may have this screening every year starting at age 63 if you have a 30-pack-year history of smoking and currently smoke or have quit within the past 15 years. Fecal occult blood test (FOBT) of the stool. You may have this test every year starting at age 86. Flexible sigmoidoscopy or colonoscopy. You may have a sigmoidoscopy every 5 years or a colonoscopy every 10 years starting at age 66. Hepatitis C blood test. Hepatitis B blood test. Sexually transmitted disease (STD) testing. Diabetes screening. This is done by checking your blood sugar (glucose) after you have not eaten for a while (fasting). You may have this done every 1-3  years. Bone density scan. This is done to screen for osteoporosis. You may have this done starting at age 67. Mammogram. This may be done every 1-2 years. Talk to your health care provider about how often you should have regular mammograms. Talk with your health care provider about your test results, treatment options, and if necessary, the need for more tests. Vaccines  Your health care provider may recommend certain vaccines, such as: Influenza vaccine. This is recommended every year. Tetanus, diphtheria, and acellular pertussis (Tdap, Td) vaccine. You may need a Td booster every 10 years. Zoster vaccine. You may need this after age 51. Pneumococcal 13-valent conjugate (PCV13) vaccine. One dose is recommended after age 24. Pneumococcal polysaccharide (PPSV23) vaccine. One dose is recommended after age 98. Talk to your health care provider about which screenings and vaccines you need and how often you need them. This information is not intended to replace advice given to you by your health care provider. Make sure you discuss any  questions you have with your health care provider. Document Released: 11/06/2015 Document Revised: 06/29/2016 Document Reviewed: 08/11/2015 Elsevier Interactive Patient Education  2017 [employer] Prevention in the Home Falls can cause injuries. They can happen to people of all ages. There are many things you can do to make your home safe and to help prevent falls. What can I do on the outside of my home? Regularly fix the edges of walkways and driveways and fix any cracks. Remove anything that might make you trip as you walk through a door, such as a raised step or threshold. Trim any bushes or trees on the path to your home. Use bright outdoor lighting. Clear any walking paths of anything that might make someone trip, such as rocks or tools. Regularly check to see if handrails are loose or broken. Make sure that both sides of any steps have handrails. Any raised decks and porches should have guardrails on the edges. Have any leaves, snow, or ice cleared regularly. Use sand or salt on walking paths during winter. Clean up any spills in your garage right away. This includes oil or grease spills. What can I do in the bathroom? Use night lights. Install grab bars by the toilet and in the tub and shower. Do not use towel bars as grab bars. Use non-skid mats or decals in the tub or shower. If you need to sit down in the shower, use a plastic, non-slip stool. Keep the floor dry. Clean up any water that spills on the floor as soon as it happens. Remove soap buildup in the tub or shower regularly. Attach bath mats securely with double-sided non-slip rug tape. Do not have throw rugs and other things on the floor that can make you trip. What can I do in the bedroom? Use night lights. Make sure that you have a light by your bed that is easy to reach. Do not use any sheets or blankets that are too big for your bed. They should not hang down onto the floor. Have a firm chair that has side  arms. You can use this for support while you get dressed. Do not have throw rugs and other things on the floor that can make you trip. What can I do in the kitchen? Clean up any spills right away. Avoid walking on wet floors. Keep items that you use a lot in easy-to-reach places. If you need to reach something above you, use a strong step stool that has a grab  bar. Keep electrical cords out of the way. Do not use floor polish or wax that makes floors slippery. If you must use wax, use non-skid floor wax. Do not have throw rugs and other things on the floor that can make you trip. What can I do with my stairs? Do not leave any items on the stairs. Make sure that there are handrails on both sides of the stairs and use them. Fix handrails that are broken or loose. Make sure that handrails are as long as the stairways. Check any carpeting to make sure that it is firmly attached to the stairs. Fix any carpet that is loose or worn. Avoid having throw rugs at the top or bottom of the stairs. If you do have throw rugs, attach them to the floor with carpet tape. Make sure that you have a light switch at the top of the stairs and the bottom of the stairs. If you do not have them, ask someone to add them for you. What else can I do to help prevent falls? Wear shoes that: Do not have high heels. Have rubber bottoms. Are comfortable and fit you well. Are closed at the toe. Do not wear sandals. If you use a stepladder: Make sure that it is fully opened. Do not climb a closed stepladder. Make sure that both sides of the stepladder are locked into place. Ask someone to hold it for you, if possible. Clearly mark and make sure that you can see: Any grab bars or handrails. First and last steps. Where the edge of each step is. Use tools that help you move around (mobility aids) if they are needed. These include: Canes. Walkers. Scooters. Crutches. Turn on the lights when you go into a dark area.  Replace any light bulbs as soon as they burn out. Set up your furniture so you have a clear path. Avoid moving your furniture around. If any of your floors are uneven, fix them. If there are any pets around you, be aware of where they are. Review your medicines with your doctor. Some medicines can make you feel dizzy. This can increase your chance of falling. Ask your doctor what other things that you can do to help prevent falls. This information is not intended to replace advice given to you by your health care provider. Make sure you discuss any questions you have with your health care provider. Document Released: 08/06/2009 Document Revised: 03/17/2016 Document Reviewed: 11/14/2014 Elsevier Interactive Patient Education  2017 [employer].

## 2022-11-17 NOTE — Progress Notes (Signed)
Subjective:   Sara [name] is a 67 y.o. female who presents for Medicare Annual (Subsequent) preventive examination.  Review of Systems     Cardiac Risk Factors include: advanced age (>40mn, >>24women);hypertension;diabetes mellitus;obesity (BMI >30kg/m2)     Objective:    Today's Vitals   11/17/22 1108  BP: 126/62  Pulse: 76  Temp: 98.3 F (36.8 C)  TempSrc: Oral  SpO2: 97%  Weight: 162 lb 9.6 oz (73.8 kg)  Height: 5' (1.524 m)   Body mass index is 31.76 kg/m.     11/17/2022   11:21 AM 12/31/2021    9:53 AM 11/11/2021    8:44 AM 06/19/2021    3:13 AM 06/17/2021    2:40 PM 06/11/2021   11:10 AM 01/22/2021   11:35 PM  Advanced Directives  Does Patient Have a Medical Advance Directive? No No No No No No No  Would patient like information on creating a medical advance directive? No - Patient declined No - Patient declined No - Patient declined  No - Patient declined  No - Patient declined    Current Medications (verified) Outpatient Encounter Medications as of 11/17/2022  Medication Sig   amLODipine (NORVASC) 2.5 MG tablet TAKE 1 TABLET(2.5 MG) BY MOUTH DAILY   anagrelide ([name]) 1 MG capsule TAKE 1 CAPSULE(1 MG) BY MOUTH DAILY   aspirin EC 81 MG tablet Take 81 mg by mouth daily. Swallow whole.   atorvastatin (LIPITOR) 40 MG tablet TAKE 1 TABLET(40 MG) BY MOUTH DAILY   blood glucose meter kit and supplies KIT Dispense based on patient and insurance preference. Use up to four times daily as directed.   Cholecalciferol (VITAMIN D3) 2000 UNITS capsule Take 2,000 Units by mouth daily.    Lacosamide 150 MG TABS TAKE 1 TABLET(150 MG) BY MOUTH TWICE DAILY   Magnesium 250 MG TABS Take 250 mg by mouth daily.   metoprolol tartrate (LOPRESSOR) 100 MG tablet Take 1 tablet (100 mg total) by mouth 2 (two) times daily.   Multiple Vitamin (MULTIVITAMIN) capsule Take 1 capsule by mouth daily.   telmisartan (MICARDIS) 80 MG tablet Take 1 tablet (80 mg total) by mouth daily.    escitalopram (LEXAPRO) 10 MG tablet Take 10 mg by mouth as needed. (Patient not taking: Reported on 11/17/2022)   No facility-administered encounter medications on file as of 11/17/2022.    Allergies (verified) Sulfa antibiotics   History: Past Medical History:  Diagnosis Date   Anxiety    Blood dyscrasia    Chronic kidney disease    Gout    Hyperlipemia    Hypertension    LVH (left ventricular hypertrophy)    12/21/09 Echo -mild asymmetric LVH,EF =>55%   Morbid obesity (HCC)    Myocardial infarction (HCC)    Nausea vomiting and diarrhea    OSA (obstructive sleep apnea)    PAD (peripheral artery disease) (HCC)    Pneumonia    Pre-diabetes    Renal artery stenosis ([facility] 12/13/2010   PTCA and Stent - right   Seizures (HCC)    Syncopal episodes    Thrombocytosis    Type 2 diabetes mellitus ([facility] 07/20/2013   Past Surgical History:  Procedure Laterality Date   BREAST CYST EXCISION     S/P Benign Right Breast Cyst Removal.   BREAST REDUCTION SURGERY  1995   S[place]P [place]ilateral [place] [facility] [place]   [place]  [place].   CORONARY ARTERY BYPASS GRAFT N/A 04/21/2017   Procedure:  CORONARY ARTERY BYPASS GRAFTING (CABG) x 2, USING LEFT MAMMARY ARTERY AND RIGHT GREATER SAPHENOUS VEIN HARVESTED ENDOSCOPICALLY;  Surgeon: [name], MD;  Location: [facility];  Service: Open Heart Surgery;  Laterality: N/A;   EXTREMITY CYST EXCISION  1993   left wrist   LEFT HEART CATH AND CORONARY ANGIOGRAPHY N/A 04/17/2017   Procedure: Left Heart Cath and Coronary Angiography;  Surgeon: Jettie Booze, MD;  Location: [facility];  Service: Cardiovascular;  Laterality: N/A;   PARTIAL HYSTERECTOMY  1994   renal artery stent placement Right    TEE WITHOUT CARDIOVERSION N/A 04/21/2017   Procedure: TRANSESOPHAGEAL ECHOCARDIOGRAM (TEE);  Surgeon: [name], MD;  Location: [facility];  Service: Open Heart Surgery;  Laterality: N/A;   VENTRAL HERNIA REPAIR N/A 06/17/2021    Procedure: OPEN VENTRAL HERNIA REPAIR WITH MESH;  Surgeon: Kinsinger, Arta Bruce, MD;  Location: [facility];  Service: General;  Laterality: N/A;   Family History  Problem Relation Age of Onset   Heart failure Mother    Diabetes Father    Diabetes Sister    Hypertension Sister    Hypertension Sister    Hypertension Sister    Breast cancer Maternal Aunt        2 maternal aunts had breast cancer.   Breast cancer Maternal Grandmother    Sleep apnea Neg Hx    Social History   Socioeconomic History   Marital status: Married    Spouse name: Juanda Crumble   Number of children: 2   Years of education: 16   Highest education level: Not on file  Occupational History   Occupation: TRANSPORTATION COOR.    Employer: [employer]  Tobacco Use   Smoking status: Never   Smokeless tobacco: Never  Vaping Use   Vaping Use: Never used  Substance and Sexual Activity   Alcohol use: No    Alcohol/week: 0.0 standard drinks of alcohol   Drug use: No   Sexual activity: Yes  Other Topics Concern   Not on file  Social History Narrative   Patient lives at husband and her son, home with family.   Patient has two adult children.   Patient is not drinking any caffeine.   Patient is working full-time.   Patient has a college education.   Patient is right-handed.   Social Determinants of Health   Financial Resource Strain: Low Risk  (11/17/2022)   Overall Financial Resource Strain (CARDIA)    Difficulty of Paying Living Expenses: Not hard at all  Food Insecurity: No Food Insecurity (11/17/2022)   Hunger Vital Sign    Worried About Running Out of Food in the Last Year: Never true    Ran Out of Food in the Last Year: Never true  Transportation Needs: No Transportation Needs (11/17/2022)   PRAPARE - Hydrologist (Medical): No    Lack of Transportation (Non-Medical): No  Physical Activity: Inactive (11/17/2022)   Exercise Vital Sign    Days of Exercise per Week: 0 days     Minutes of Exercise per Session: 0 min  Stress: Stress Concern Present (11/17/2022)   [place]    Feeling of Stress : To some extent  Social Connections: Socially Integrated (11/04/2020)   Social Connection and Isolation Panel [NHANES]    Frequency of Communication with Friends and Family: More than three times a week    Frequency of Social Gatherings with Friends and Family:  Not on file    Attends Religious Services: More than 4 times per year    Active Member of Clubs or Organizations: Yes    Attends Archivist Meetings: More than 4 times per year    Marital Status: Married    Tobacco Counseling Counseling given: Not Answered   Clinical Intake:  Pre-visit preparation completed: Yes  Pain : No/denies pain     Nutritional Status: BMI > 30  Obese Nutritional Risks: None Diabetes: Yes  How often do you need to have someone help you when you read instructions, pamphlets, or other written materials from your doctor or pharmacy?: 1 - Never  Diabetic? Yes Nutrition Risk Assessment:  Has the patient had any N/V/D within the last 2 months?  No  Does the patient have any non-healing wounds?  No  Has the patient had any unintentional weight loss or weight gain?  No   Diabetes:  Is the patient diabetic?  Yes  If diabetic, was a CBG obtained today?  No  Did the patient bring in their glucometer from home?  No  How often do you monitor your CBG's? Does not.   Financial Strains and Diabetes Management:  Are you having any financial strains with the device, your supplies or your medication? No .  Does the patient want to be seen by Chronic Care Management for management of their diabetes?  No  Would the patient like to be referred to a Nutritionist or for Diabetic Management?  No   Diabetic Exams:  Diabetic Eye Exam: Completed 06/09/2022 Diabetic Foot Exam: Overdue, Pt has been advised about the  importance in completing this exam. Pt is scheduled for diabetic foot exam on next appointment.   Interpreter Needed?: No  Information entered by :: NAllen LPN   Activities of Daily Living    11/17/2022   11:22 AM  In your present state of health, do you have any difficulty performing the following activities:  Hearing? 0  Vision? 0  Difficulty concentrating or making decisions? 0  Walking or climbing stairs? 0  Dressing or bathing? 0  Doing errands, shopping? 0  Preparing Food and eating ? N  Using the Toilet? N  In the past six months, have you accidently leaked urine? N  Do you have problems with loss of bowel control? N  Managing your Medications? N  Managing your Finances? N  Housekeeping or managing your Housekeeping? N    Patient Care Team: [name], MD as PCP - General (Internal Medicine) Croitoru, Dani Gobble, MD as PCP - Cardiology (Cardiology) Mayford Knife, [facility] (Pharmacist)  Indicate any recent Medical Services you may have received from other than [facility] providers in the past year (date may be approximate).     Assessment:   This is a routine wellness examination for Bernadetta.  Hearing/Vision screen Vision Screening - Comments:: Regular eye exams, [facility], Dr. Einar Gip  Dietary issues and exercise activities discussed: Current Exercise Habits: The patient does not participate in regular exercise at present   Goals Addressed             This Visit's Progress    Patient Stated       11/17/2022, wants to keep weight in control and keep bp and blood sugar down       Depression Screen    11/17/2022   11:22 AM 06/01/2022    2:16 PM 06/01/2022    1:57 PM 11/11/2021    8:45 AM 11/04/2020  3:01 PM 09/02/2019   10:34 AM 03/13/2019   10:35 AM  PHQ 2/9 Scores  PHQ - 2 Score 0 2 0 3 0 0 0  PHQ- 9 Score  4  3       Fall Risk    11/17/2022   11:22 AM 06/01/2022    1:58 PM 11/11/2021    8:45 AM 11/04/2020    3:01 PM 09/02/2019   10:34 AM  [place] in the past year? 0 0 0 0 0  Number falls in past yr: 0 0     Injury with Fall? 0 0     Risk for fall due to : Medication side effect No Fall Risks Medication side effect    Follow up Falls prevention discussed;Education provided;Falls evaluation completed Falls evaluation completed Falls evaluation completed;Education provided;Falls prevention discussed      FALL RISK PREVENTION PERTAINING TO THE HOME:  Any stairs in or around the home? Yes  If so, are there any without handrails? no Home free of loose throw rugs in walkways, pet beds, electrical cords, etc? Yes  Adequate lighting in your home to reduce risk of falls? Yes   ASSISTIVE DEVICES UTILIZED TO PREVENT FALLS:  Life alert? No  Use of a cane, walker or w/c? No  Grab bars in the bathroom? No  Shower chair or bench in shower? No  Elevated toilet seat or a handicapped toilet? Yes   TIMED UP AND GO:  Was the test performed? Yes .  Length of time to ambulate 10 feet: 5 sec.   Gait steady and fast without use of assistive device  Cognitive Function:    07/23/2019    8:07 AM  MMSE - Mini Mental State Exam  Orientation to time 5  Orientation to Place 5  Registration 3  Attention/ Calculation 5  Recall 2  Language- name 2 objects 2  Language- repeat 1  Language- follow 3 step command 3  Language- read & follow direction 1  Write a sentence 1  Copy design 1  Total score 29        11/17/2022   11:23 AM 11/11/2021    8:49 AM 11/04/2020    3:03 PM  6CIT Screen  What Year? 0 points 0 points 0 points  What month? 0 points 0 points 0 points  What time? 0 points 0 points 0 points  Count back from 20 0 points 0 points 0 points  Months in reverse 0 points 0 points 0 points  Repeat phrase 0 points 2 points 2 points  Total Score 0 points 2 points 2 points    Immunizations Immunization History  Administered Date(s) Administered   Fluad Quad(high Dose 65+) 11/04/2020, 07/19/2021, 10/10/2022    Influenza,inj,Quad PF,6+ Mos 07/09/2019   PFIZER(Purple Top)SARS-COV-2 Vaccination 11/30/2019, 12/23/2019, 08/10/2020   PNEUMOCOCCAL CONJUGATE-20 11/11/2021   Tdap 03/13/2019   Zoster Recombinat (Shingrix) 12/20/2021, 06/01/2022    TDAP status: Up to date  Flu Vaccine status: Up to date  Pneumococcal vaccine status: Up to date  Covid-19 vaccine status: Completed vaccines  Qualifies for Shingles Vaccine? Yes   Zostavax completed Yes   Shingrix Completed?: Yes  Screening Tests Health Maintenance  Topic Date Due   FOOT EXAM  03/18/2022   COLONOSCOPY (Pts 45-7yr Insurance coverage will need to be confirmed)  06/01/2022   COVID-19 Vaccine (4 - 2023-24 season) 06/24/2022   Medicare Annual Wellness (AWV)  11/11/2022   HEMOGLOBIN A1C  04/11/2023   OPHTHALMOLOGY  EXAM  06/10/2023   Diabetic kidney evaluation - Urine ACR  07/15/2023   Diabetic kidney evaluation - eGFR measurement  10/11/2023   MAMMOGRAM  05/20/2024   DTaP/Tdap/Td (2 - Td or Tdap) 03/12/2029   Pneumonia Vaccine 52+ Years old  Completed   INFLUENZA VACCINE  Completed   DEXA SCAN  Completed   Hepatitis C Screening  Completed   Zoster Vaccines- Shingrix  Completed   HPV VACCINES  Aged Out    Health Maintenance  Health Maintenance Due  Topic Date Due   FOOT EXAM  03/18/2022   COLONOSCOPY (Pts 45-46yr Insurance coverage will need to be confirmed)  06/01/2022   COVID-19 Vaccine (4 - 2023-24 season) 06/24/2022   Medicare Annual Wellness (AWV)  11/11/2022    Colorectal cancer screening: scheduled for 11/22/2021  Mammogram status: Completed 05/20/2022. Repeat every year  Bone Density status: Completed 04/13/2018.   Lung Cancer Screening: (Low Dose CT Chest recommended if Age 69-80years, 30 pack-year currently smoking OR have quit w/in 15years.) does not qualify.   Lung Cancer Screening Referral: no  Additional Screening:  Hepatitis C Screening: does qualify; Completed 11/28/2012  Vision Screening:  Recommended annual ophthalmology exams for early detection of glaucoma and other disorders of the eye. Is the patient up to date with their annual eye exam?  Yes  Who is the provider or what is the name of the office in which the patient attends annual eye exams? Dr. MEinar GipIf pt is not established with a provider, would they like to be referred to a provider to establish care? No .   Dental Screening: Recommended annual dental exams for proper oral hygiene  Community Resource Referral / Chronic Care Management: CRR required this visit?  No   CCM required this visit?  No      Plan:     I have personally reviewed and noted the following in the patient's chart:   Medical and social history Use of alcohol, tobacco or illicit drugs  Current medications and supplements including opioid prescriptions. Patient is not currently taking opioid prescriptions. Functional ability and status Nutritional status Physical activity Advanced directives List of other physicians Hospitalizations, surgeries, and ER visits in previous 12 months Vitals Screenings to include cognitive, depression, and falls Referrals and appointments  In addition, I have reviewed and discussed with patient certain preventive protocols, quality metrics, and best practice recommendations. A written personalized care plan for preventive services as well as general preventive health recommendations were provided to patient.     NKellie Simmering LPN   08/26/130  Nurse Notes: none

## 2022-11-18 ENCOUNTER — Encounter: Payer: Self-pay | Admitting: Hematology

## 2022-11-22 DIAGNOSIS — Z86010 Personal history of colonic polyps: Secondary | ICD-10-CM | POA: Diagnosis not present

## 2022-11-22 DIAGNOSIS — K635 Polyp of colon: Secondary | ICD-10-CM | POA: Diagnosis not present

## 2022-11-22 DIAGNOSIS — Z1211 Encounter for screening for malignant neoplasm of colon: Secondary | ICD-10-CM | POA: Diagnosis not present

## 2022-11-22 LAB — HM COLONOSCOPY

## 2022-11-28 DIAGNOSIS — K635 Polyp of colon: Secondary | ICD-10-CM | POA: Diagnosis not present

## 2022-12-08 ENCOUNTER — Ambulatory Visit: Payer: Self-pay

## 2022-12-08 NOTE — Patient Instructions (Addendum)
Visit Information  Thank you for taking time to visit with me today. Please don't hesitate to contact me if I can be of assistance to you.   Following are the goals we discussed today:   Goals Addressed               This Visit's Progress     Patient Stated     I want to keep my blood pressure under good control (pt-stated)        Care Coordination Interventions: Evaluation of current treatment plan related to hypertension self management and patient's adherence to plan as established by provider Determined patient is self monitoring her BP at home occasionally, she reports her most recent BP was noted to be 120/80 Educated patient regarding the benefits of exercise to help better manage BP Educated patient on the importance of monitoring BP on a regular basis to help ensure BP is staying within normal limits  Determined patient's goal is to keep BP well controlled and or to be able to come off some of her BP meds Educated patient on the importance of staying well hydrated with water and to notify her PCP of new symptoms or concerns             Our next appointment is by telephone on 02/13/23 at 09:00 AM  Please call the care guide team at [phone] if you need to cancel or reschedule your appointment.   If you are experiencing a Mental Health or [place] or need someone to talk to, please call 1-800-273-TALK (toll free, 24 hour hotline) go to [facility] [address] [place] [phone])  Patient verbalizes understanding of instructions and care plan provided today and agrees to view in [place]. Active MyChart status and patient understanding of how to access instructions and care plan via MyChart confirmed with patient.     Barb Merino, RN, BSN, CCM Care Management Coordinator [facility] Care Management  Direct Phone: [phone]

## 2022-12-08 NOTE — Patient Outreach (Signed)
  Care Coordination   Follow Up Visit Note   12/08/2022 Name: Sara [name] MRN: [id number] DOB: [birth year]  Sara [name] is a 67 y.o. year old female who sees [name], MD for primary care. I spoke with  Sara [name] by phone today.  What matters to the patients health and wellness today?  Patient will continue to exercise and monitor her BP at home to achieve her weight loss goals, lower her A1c and maintain adequate BP.     Goals Addressed               This Visit's Progress     Patient Stated     I want to keep my blood pressure under good control (pt-stated)        Care Coordination Interventions: Evaluation of current treatment plan related to hypertension self management and patient's adherence to plan as established by provider Determined patient is self monitoring her BP at home occasionally, she reports her most recent BP was noted to be 120/80 Educated patient regarding the benefits of exercise to help better manage BP Educated patient on the importance of monitoring BP on a regular basis to help ensure BP is staying within normal limits  Determined patient's goal is to keep BP well controlled and or to be able to come off some of her BP meds Educated patient on the importance of staying well hydrated with water and to notify her PCP of new symptoms or concerns      Interventions Today    Flowsheet Row Most Recent Value  Chronic Disease   Chronic disease during today's visit Hypertension (HTN), Diabetes  General Interventions   General Interventions Discussed/Reviewed General Interventions Discussed, General Interventions Reviewed, Annual Foot Exam  Exercise Interventions   Exercise Discussed/Reviewed Exercise Discussed, Exercise Reviewed, Physical Activity, Weight Managment  Physical Activity Discussed/Reviewed Physical Activity Discussed, Physical Activity Reviewed, PREP  Weight Management Weight loss          SDOH assessments and  interventions completed:  No     Care Coordination Interventions:  Yes, provided   Follow up plan: Follow up call scheduled for 02/13/23 '@09'$ :00 AM    Encounter Outcome:  Pt. Visit Completed

## 2022-12-23 DIAGNOSIS — D126 Benign neoplasm of colon, unspecified: Secondary | ICD-10-CM | POA: Diagnosis not present

## 2022-12-23 DIAGNOSIS — E119 Type 2 diabetes mellitus without complications: Secondary | ICD-10-CM | POA: Diagnosis not present

## 2022-12-23 DIAGNOSIS — Z6828 Body mass index (BMI) 28.0-28.9, adult: Secondary | ICD-10-CM | POA: Diagnosis not present

## 2022-12-23 DIAGNOSIS — K5790 Diverticulosis of intestine, part unspecified, without perforation or abscess without bleeding: Secondary | ICD-10-CM | POA: Diagnosis not present

## 2022-12-23 DIAGNOSIS — Z0000 Encounter for general adult medical examination without abnormal findings: Secondary | ICD-10-CM | POA: Diagnosis not present

## 2022-12-26 ENCOUNTER — Other Ambulatory Visit: Payer: Self-pay | Admitting: Internal Medicine

## 2022-12-26 DIAGNOSIS — I130 Hypertensive heart and chronic kidney disease with heart failure and stage 1 through stage 4 chronic kidney disease, or unspecified chronic kidney disease: Secondary | ICD-10-CM

## 2022-12-26 DIAGNOSIS — I10 Essential (primary) hypertension: Secondary | ICD-10-CM

## 2023-01-02 DIAGNOSIS — H2513 Age-related nuclear cataract, bilateral: Secondary | ICD-10-CM | POA: Diagnosis not present

## 2023-01-02 DIAGNOSIS — H43392 Other vitreous opacities, left eye: Secondary | ICD-10-CM | POA: Diagnosis not present

## 2023-01-23 ENCOUNTER — Other Ambulatory Visit: Payer: Self-pay

## 2023-01-23 ENCOUNTER — Inpatient Hospital Stay: Payer: Medicare PPO | Attending: Nurse Practitioner

## 2023-01-23 DIAGNOSIS — Z0000 Encounter for general adult medical examination without abnormal findings: Secondary | ICD-10-CM

## 2023-01-23 DIAGNOSIS — D473 Essential (hemorrhagic) thrombocythemia: Secondary | ICD-10-CM | POA: Diagnosis not present

## 2023-01-23 LAB — CBC WITH DIFFERENTIAL/PLATELET
Abs Immature Granulocytes: 0.02 10*3/uL (ref 0.00–0.07)
Basophils Absolute: 0.1 10*3/uL (ref 0.0–0.1)
Basophils Relative: 1 %
Eosinophils Absolute: 0.3 10*3/uL (ref 0.0–0.5)
Eosinophils Relative: 5 %
HCT: 41.4 % (ref 36.0–46.0)
Hemoglobin: 13.7 g/dL (ref 12.0–15.0)
Immature Granulocytes: 0 %
Lymphocytes Relative: 24 %
Lymphs Abs: 1.3 10*3/uL (ref 0.7–4.0)
MCH: 30.2 pg (ref 26.0–34.0)
MCHC: 33.1 g/dL (ref 30.0–36.0)
MCV: 91.4 fL (ref 80.0–100.0)
Monocytes Absolute: 0.5 10*3/uL (ref 0.1–1.0)
Monocytes Relative: 8 %
Neutro Abs: 3.3 10*3/uL (ref 1.7–7.7)
Neutrophils Relative %: 62 %
Platelets: 395 10*3/uL (ref 150–400)
RBC: 4.53 MIL/uL (ref 3.87–5.11)
RDW: 13.5 % (ref 11.5–15.5)
WBC: 5.4 10*3/uL (ref 4.0–10.5)
nRBC: 0 % (ref 0.0–0.2)

## 2023-01-26 DIAGNOSIS — F411 Generalized anxiety disorder: Secondary | ICD-10-CM | POA: Diagnosis not present

## 2023-01-26 DIAGNOSIS — F331 Major depressive disorder, recurrent, moderate: Secondary | ICD-10-CM | POA: Diagnosis not present

## 2023-02-09 ENCOUNTER — Ambulatory Visit: Payer: Medicare PPO | Admitting: Internal Medicine

## 2023-02-09 ENCOUNTER — Encounter: Payer: Self-pay | Admitting: Internal Medicine

## 2023-02-09 VITALS — BP 136/80 | HR 84 | Temp 98.6°F | Ht 60.0 in | Wt 157.6 lb

## 2023-02-09 DIAGNOSIS — I130 Hypertensive heart and chronic kidney disease with heart failure and stage 1 through stage 4 chronic kidney disease, or unspecified chronic kidney disease: Secondary | ICD-10-CM

## 2023-02-09 DIAGNOSIS — I5032 Chronic diastolic (congestive) heart failure: Secondary | ICD-10-CM | POA: Diagnosis not present

## 2023-02-09 DIAGNOSIS — G4733 Obstructive sleep apnea (adult) (pediatric): Secondary | ICD-10-CM

## 2023-02-09 DIAGNOSIS — E1122 Type 2 diabetes mellitus with diabetic chronic kidney disease: Secondary | ICD-10-CM | POA: Diagnosis not present

## 2023-02-09 DIAGNOSIS — Z6830 Body mass index (BMI) 30.0-30.9, adult: Secondary | ICD-10-CM | POA: Diagnosis not present

## 2023-02-09 DIAGNOSIS — I2510 Atherosclerotic heart disease of native coronary artery without angina pectoris: Secondary | ICD-10-CM | POA: Diagnosis not present

## 2023-02-09 DIAGNOSIS — E6609 Other obesity due to excess calories: Secondary | ICD-10-CM | POA: Diagnosis not present

## 2023-02-09 DIAGNOSIS — I1310 Hypertensive heart and chronic kidney disease without heart failure, with stage 1 through stage 4 chronic kidney disease, or unspecified chronic kidney disease: Secondary | ICD-10-CM

## 2023-02-09 DIAGNOSIS — N1831 Chronic kidney disease, stage 3a: Secondary | ICD-10-CM

## 2023-02-09 MED ORDER — TELMISARTAN 80 MG PO TABS: 80.0000 mg | ORAL_TABLET | Freq: Every day | ORAL | 1 refills | Status: DC

## 2023-02-09 NOTE — Patient Instructions (Signed)
Hypertension, Adult ?Hypertension is another name for high blood pressure. High blood pressure forces your heart to work harder to pump blood. This can cause problems over time. ?There are two numbers in a blood pressure reading. There is a top number (systolic) over a bottom number (diastolic). It is best to have a blood pressure that is below 120/80. ?What are the causes? ?The cause of this condition is not known. Some other conditions can lead to high blood pressure. ?What increases the risk? ?Some lifestyle factors can make you more likely to develop high blood pressure: ?Smoking. ?Not getting enough exercise or physical activity. ?Being overweight. ?Having too much fat, sugar, calories, or salt (sodium) in your diet. ?Drinking too much alcohol. ?Other risk factors include: ?Having any of these conditions: ?Heart disease. ?Diabetes. ?High cholesterol. ?Kidney disease. ?Obstructive sleep apnea. ?Having a family history of high blood pressure and high cholesterol. ?Age. The risk increases with age. ?Stress. ?What are the signs or symptoms? ?High blood pressure may not cause symptoms. Very high blood pressure (hypertensive crisis) may cause: ?Headache. ?Fast or uneven heartbeats (palpitations). ?Shortness of breath. ?Nosebleed. ?Vomiting or feeling like you may vomit (nauseous). ?Changes in how you see. ?Very bad chest pain. ?Feeling dizzy. ?Seizures. ?How is this treated? ?This condition is treated by making healthy lifestyle changes, such as: ?Eating healthy foods. ?Exercising more. ?Drinking less alcohol. ?Your doctor may prescribe medicine if lifestyle changes do not help enough and if: ?Your top number is above 130. ?Your bottom number is above 80. ?Your personal target blood pressure may vary. ?Follow these instructions at home: ?Eating and drinking ? ?If told, follow the DASH eating plan. To follow this plan: ?Fill one half of your plate at each meal with fruits and vegetables. ?Fill one fourth of your plate  at each meal with whole grains. Whole grains include whole-wheat pasta, brown rice, and whole-grain bread. ?Eat or drink low-fat dairy products, such as skim milk or low-fat yogurt. ?Fill one fourth of your plate at each meal with low-fat (lean) proteins. Low-fat proteins include fish, chicken without skin, eggs, beans, and tofu. ?Avoid fatty meat, cured and processed meat, or chicken with skin. ?Avoid pre-made or processed food. ?Limit the amount of salt in your diet to less than 1,500 mg each day. ?Do not drink alcohol if: ?Your doctor tells you not to drink. ?You are pregnant, may be pregnant, or are planning to become pregnant. ?If you drink alcohol: ?Limit how much you have to: ?0-1 drink a day for women. ?0-2 drinks a day for men. ?Know how much alcohol is in your drink. In the [place] one drink equals one 12 oz bottle of beer (355 mL), one 5 oz glass of wine (148 mL), or one 1? oz glass of hard liquor (44 mL). ?Lifestyle ? ?Work with your doctor to stay at a healthy weight or to lose weight. Ask your doctor what the best weight is for you. ?Get at least 30 minutes of exercise that causes your heart to beat faster (aerobic exercise) most days of the week. This may include walking, swimming, or biking. ?Get at least 30 minutes of exercise that strengthens your muscles (resistance exercise) at least 3 days a week. This may include lifting weights or doing Pilates. ?Do not smoke or use any products that contain nicotine or tobacco. If you need help quitting, ask your doctor. ?Check your blood pressure at home as told by your doctor. ?Keep all follow-up visits. ?Medicines ?Take over-the-counter and prescription medicines   only as told by your doctor. Follow directions carefully. ?Do not skip doses of blood pressure medicine. The medicine does not work as well if you skip doses. Skipping doses also puts you at risk for problems. ?Ask your doctor about side effects or reactions to medicines that you should watch  for. ?Contact a doctor if: ?You think you are having a reaction to the medicine you are taking. ?You have headaches that keep coming back. ?You feel dizzy. ?You have swelling in your ankles. ?You have trouble with your vision. ?Get help right away if: ?You get a very bad headache. ?You start to feel mixed up (confused). ?You feel weak or numb. ?You feel faint. ?You have very bad pain in your: ?Chest. ?Belly (abdomen). ?You vomit more than once. ?You have trouble breathing. ?These symptoms may be an emergency. Get help right away. Call 911. ?Do not wait to see if the symptoms will go away. ?Do not drive yourself to the hospital. ?Summary ?Hypertension is another name for high blood pressure. ?High blood pressure forces your heart to work harder to pump blood. ?For most people, a normal blood pressure is less than 120/80. ?Making healthy choices can help lower blood pressure. If your blood pressure does not get lower with healthy choices, you may need to take medicine. ?This information is not intended to replace advice given to you by your health care provider. Make sure you discuss any questions you have with your health care provider. ?Document Revised: 07/29/2021 Document Reviewed: 07/29/2021 ?Elsevier Patient Education ? 2023 Elsevier Inc. ? ?

## 2023-02-09 NOTE — Progress Notes (Signed)
Hershal Coria Martin,acting as a Neurosurgeon for [name], MD.,have documented all relevant documentation on the behalf of [name], MD,as directed by  [name], MD while in the presence of [name], MD.    Subjective:     Patient ID: Sara [name] , female    DOB: 02/22/1956 , 67 y.o.   MRN: [id number]   Chief Complaint  Patient presents with   Diabetes   Hypertension    HPI  Patient presents today for a BP and DM check. Patient states compliance with medications and has no other concerns today. Patient reports not getting a lot of rest last night due to her son being up last night.   BP Readings from Last 3 Encounters: 02/09/23 : (!) 140/80 11/17/22 : 126/62 11/15/22 : 122/74     Diabetes She presents for her follow-up diabetic visit. She has type 2 diabetes mellitus. There are no hypoglycemic associated symptoms. There are no diabetic associated symptoms. Pertinent negatives for diabetes include no blurred vision and no chest pain. There are no hypoglycemic complications. Diabetic complications include heart disease and nephropathy. Risk factors for coronary artery disease include diabetes mellitus, dyslipidemia, hypertension, obesity, post-menopausal and sedentary lifestyle. She is compliant with treatment most of the time. She is following a diabetic diet. She participates in exercise three times a week. Her breakfast blood glucose is taken between 7-8 am. Her breakfast blood glucose range is generally 90-110 mg/dl. An ACE inhibitor/angiotensin II receptor blocker is being taken. Eye exam is current.  Hypertension This is a chronic problem. The current episode started more than 1 year ago. The problem has been gradually improving since onset. The problem is uncontrolled. Pertinent negatives include no blurred vision, chest pain, palpitations or shortness of breath. The current treatment provides moderate improvement. Compliance problems include exercise.   Hypertensive end-organ damage includes CAD/MI.     Past Medical History:  Diagnosis Date   Anxiety    Blood dyscrasia    Chronic kidney disease    Gout    Hyperlipemia    Hypertension    LVH (left ventricular hypertrophy)    12/21/09 Echo -mild asymmetric LVH,EF =>55%   Morbid obesity    Myocardial infarction    Nausea vomiting and diarrhea    OSA (obstructive sleep apnea)    PAD (peripheral artery disease)    Pneumonia    Pre-diabetes    Renal artery stenosis 12/13/2010   PTCA and Stent - right   Seizures    Syncopal episodes    Thrombocytosis    Type 2 diabetes mellitus 07/20/2013     Family History  Problem Relation Age of Onset   Heart failure Mother    Diabetes Father    Diabetes Sister    Hypertension Sister    Hypertension Sister    Hypertension Sister    Breast cancer Maternal Aunt        2 maternal aunts had breast cancer.   Breast cancer Maternal Grandmother    Sleep apnea Neg Hx      Current Outpatient Medications:    amLODipine (NORVASC) 2.5 MG tablet, TAKE 1 TABLET(2.5 MG) BY MOUTH DAILY, Disp: 90 tablet, Rfl: 1   anagrelide ([name]) 1 MG capsule, TAKE 1 CAPSULE(1 MG) BY MOUTH DAILY, Disp: 30 capsule, Rfl: 5   aspirin EC 81 MG tablet, Take 81 mg by mouth daily. Swallow whole., Disp: , Rfl:    atorvastatin (LIPITOR) 40 MG tablet, TAKE 1 TABLET(40 MG) BY  MOUTH DAILY, Disp: 90 tablet, Rfl: 3   blood glucose meter kit and supplies KIT, Dispense based on patient and insurance preference. Use up to four times daily as directed., Disp: 1 each, Rfl: 0   Cholecalciferol (VITAMIN D3) 2000 UNITS capsule, Take 2,000 Units by mouth daily. , Disp: , Rfl:    hydrOXYzine (ATARAX) 10 MG tablet, Take 10 mg by mouth daily as needed., Disp: , Rfl:    Lacosamide 150 MG TABS, TAKE 1 TABLET(150 MG) BY MOUTH TWICE DAILY, Disp: 60 tablet, Rfl: 5   Magnesium 250 MG TABS, Take 250 mg by mouth daily., Disp: , Rfl:    metoprolol tartrate (LOPRESSOR) 100 MG tablet, Take 1 tablet  (100 mg total) by mouth 2 (two) times daily., Disp: 180 tablet, Rfl: 3   Multiple Vitamin (MULTIVITAMIN) capsule, Take 1 capsule by mouth daily., Disp: , Rfl:    telmisartan (MICARDIS) 80 MG tablet, Take 1 tablet (80 mg total) by mouth daily., Disp: 90 tablet, Rfl: 1   Allergies  Allergen Reactions   Sulfa Antibiotics Itching     Review of Systems  Constitutional: Negative.   Eyes:  Negative for blurred vision.  Respiratory: Negative.  Negative for shortness of breath.   Cardiovascular: Negative.  Negative for chest pain and palpitations.  Gastrointestinal: Negative.   Musculoskeletal: Negative.   Skin: Negative.   Neurological: Negative.   Psychiatric/Behavioral: Negative.       Today's Vitals   02/09/23 0837 02/09/23 0850  BP: (!) 140/80 136/80  Pulse: 84   Temp: 98.6 F (37 C)   TempSrc: Oral   Weight: 157 lb 9.6 oz (71.5 kg)   Height: 5' (1.524 m)   PainSc: 0-No pain    Body mass index is 30.78 kg/m.  Wt Readings from Last 3 Encounters:  02/09/23 157 lb 9.6 oz (71.5 kg)  11/17/22 162 lb 9.6 oz (73.8 kg)  11/15/22 160 lb 12.8 oz (72.9 kg)    Objective:  Physical Exam Vitals and nursing note reviewed.  Constitutional:      Appearance: Normal appearance.  HENT:     Head: Normocephalic and atraumatic.  Eyes:     Extraocular Movements: Extraocular movements intact.  Cardiovascular:     Rate and Rhythm: Normal rate and regular rhythm.     Heart sounds: Normal heart sounds.  Pulmonary:     Effort: Pulmonary effort is normal.     Breath sounds: Normal breath sounds.  Musculoskeletal:     Cervical back: Normal range of motion.  Skin:    General: Skin is warm.  Neurological:     General: No focal deficit present.     Mental Status: She is alert.  Psychiatric:        Mood and Affect: Mood normal.        Behavior: Behavior normal.         Assessment And Plan:     1. Type 2 diabetes mellitus with stage 3a chronic kidney disease, without long-term current  use of insulin Comments: Chronic, reminded to avoid NSAIDs, stay well hydrated and keep BP/BS controlled to decrease risk of CKD progression. - PTH, intact and calcium - Phosphorus - Protein electrophoresis, serum - CMP14+EGFR - Hemoglobin A1c  2. Hypertensive heart and renal disease with renal failure, stage 1 through stage 4 or unspecified chronic kidney disease, without heart failure Comments: Chronic, fair control. Admits she has not had alot of sleep in the past two days. She has been dealing with her son and  his mental health issues. - telmisartan (MICARDIS) 80 MG tablet; Take 1 tablet (80 mg total) by mouth daily.  Dispense: 90 tablet; Refill: 1 - CMP14+EGFR  3. CAD s/p CABG  Comments: Chronic, reports compliance with Bblocker, ASA and statin therapy. Encouraged to follow heart healthy lifestyle.  4. Chronic diastolic congestive heart failure Comments: Chronic, clinically euvolemic. Last echo 2018, recent Cardiology notes reviewed. Diuretics not needed at this time.  5. OSA (obstructive sleep apnea) Comments: She admits to noncompliance with both CPAP and dental device.  6. Class 1 obesity due to excess calories with serious comorbidity and body mass index (BMI) of 30.0 to 30.9 in adult Comments: She is encouraged to aim for at least 150 minutes of exercise/ week.     Patient was given opportunity to ask questions. Patient verbalized understanding of the plan and was able to repeat key elements of the plan. All questions were answered to their satisfaction.   I, [name], MD, have reviewed all documentation for this visit. The documentation on 02/09/23 for the exam, diagnosis, procedures, and orders are all accurate and complete.   IF YOU HAVE BEEN REFERRED TO A SPECIALIST, IT MAY TAKE 1-2 WEEKS TO SCHEDULE/PROCESS THE REFERRAL. IF YOU HAVE NOT HEARD FROM US/SPECIALIST IN TWO WEEKS, PLEASE GIVE [place] A CALL AT [phone] X [phone].   THE PATIENT IS ENCOURAGED TO PRACTICE  SOCIAL DISTANCING DUE TO THE COVID-19 PANDEMIC.

## 2023-02-13 ENCOUNTER — Ambulatory Visit: Payer: Self-pay

## 2023-02-13 LAB — PROTEIN ELECTROPHORESIS, SERUM
A/G Ratio: 1.1 (ref 0.7–1.7)
Albumin ELP: 3.6 g/dL (ref 2.9–4.4)
Alpha 1: 0.2 g/dL (ref 0.0–0.4)
Alpha 2: 0.8 g/dL (ref 0.4–1.0)
Beta: 1.1 g/dL (ref 0.7–1.3)
Gamma Globulin: 1.2 g/dL (ref 0.4–1.8)
Globulin, Total: 3.3 g/dL (ref 2.2–3.9)

## 2023-02-13 LAB — CMP14+EGFR
ALT: 40 IU/L — ABNORMAL HIGH (ref 0–32)
AST: 28 IU/L (ref 0–40)
Albumin/Globulin Ratio: 1.4 (ref 1.2–2.2)
Albumin: 4 g/dL (ref 3.9–4.9)
Alkaline Phosphatase: 102 IU/L (ref 44–121)
BUN/Creatinine Ratio: 16 (ref 12–28)
BUN: 19 mg/dL (ref 8–27)
Bilirubin Total: 0.5 mg/dL (ref 0.0–1.2)
CO2: 22 mmol/L (ref 20–29)
Calcium: 10.4 mg/dL — ABNORMAL HIGH (ref 8.7–10.3)
Chloride: 100 mmol/L (ref 96–106)
Creatinine, Ser: 1.17 mg/dL — ABNORMAL HIGH (ref 0.57–1.00)
Globulin, Total: 2.9 g/dL (ref 1.5–4.5)
Glucose: 94 mg/dL (ref 70–99)
Potassium: 4.4 mmol/L (ref 3.5–5.2)
Sodium: 140 mmol/L (ref 134–144)
Total Protein: 6.9 g/dL (ref 6.0–8.5)
eGFR: 51 mL/min/{1.73_m2} — ABNORMAL LOW (ref >59)

## 2023-02-13 LAB — HEMOGLOBIN A1C
Est. average glucose Bld gHb Est-mCnc: 123 mg/dL
Hgb A1c MFr Bld: 5.9 % — ABNORMAL HIGH (ref 4.8–5.6)

## 2023-02-13 LAB — PHOSPHORUS: Phosphorus: 3.2 mg/dL (ref 3.0–4.3)

## 2023-02-13 LAB — PTH, INTACT AND CALCIUM: PTH: 42 pg/mL (ref 15–65)

## 2023-02-13 NOTE — Patient Outreach (Signed)
Care Coordination   Follow Up Visit Note   02/13/2023 Name: Sara [name] MRN: [id number] DOB: 09-Jun-1956  Sara [name] is a 67 y.o. year old female who sees Dorothyann Peng, MD for primary care. I spoke with  Sara [name] by phone today.  What matters to the patients health and wellness today?  Patient will continue to exercise, increase her daily water intake and eat less carbohydrates to help manage her chronic health conditions.     Goals Addressed               This Visit's Progress     Patient Stated     I want to keep my blood pressure under good control (pt-stated)        Care Coordination Interventions: Evaluation of current treatment plan related to hypertension self management and patient's adherence to plan as established by provider Counseled on the importance of exercise goals with target of 150 minutes per week Advised patient, providing education and rationale, to monitor blood pressure daily and record, calling PCP for findings outside established parameters Discussed complications of poorly controlled blood pressure such as heart disease, stroke, circulatory complications, vision complications, kidney impairment, sexual dysfunction Reviewed scheduled/upcoming provider appointments including: next scheduled PCP follow up set for 06/12/23 :40 AM Last practice recorded BP readings:  BP Readings from Last 3 Encounters:  02/09/23 136/80  11/17/22 126/62  11/15/22 122/74  Most recent eGFR/CrCl:  Lab Results  Component Value Date   EGFR 49 (L) 10/10/2022         I would like to lose weight (pt-stated)        Care Coordination Interventions: Determined patient completed both the Silver Sneakers and PREP program  Provided verbal and/or written education to patient re: provider recommended life style modifications  Reviewed and discussed 5 lb weight loss since last PCP follow up Positive reinforcement provided to patient for making lifestyle changes to  improve her overall health  Educated patient on BMI range to achieve a healthy weight  Counseled on the importance of exercise goals with target of 150 minutes per week Body mass index is 30.78 kg/m.     Wt Readings from Last 3 Encounters:  02/09/23 157 lb 9.6 oz (71.5 kg)  11/17/22 162 lb 9.6 oz (73.8 kg)  11/15/22 160 lb 12.8 oz (72.9 kg)     Interventions Today    Flowsheet Row Most Recent Value  Chronic Disease   Chronic disease during today's visit Diabetes, Chronic Kidney Disease/End Stage Renal Disease (ESRD), Hypertension (HTN), Other  [Obesity]  General Interventions   General Interventions Discussed/Reviewed Labs, General Interventions Discussed, General Interventions Reviewed, Doctor Visits  Labs Kidney Function  Doctor Visits Discussed/Reviewed Doctor Visits Discussed, Doctor Visits Reviewed, PCP  Exercise Interventions   Exercise Discussed/Reviewed Exercise Discussed, Exercise Reviewed, Physical Activity  Physical Activity Discussed/Reviewed Physical Activity Discussed, Physical Activity Reviewed, PREP, Gym  Education Interventions   Education Provided Provided Education  Provided Verbal Education On Nutrition, Labs, Exercise, Medication, When to see the doctor  Labs Reviewed Hgb A1c, Kidney Function  Nutrition Interventions   Nutrition Discussed/Reviewed Nutrition Discussed, Carbohydrate meal planning, Nutrition Reviewed, Fluid intake  Pharmacy Interventions   Pharmacy Dicussed/Reviewed Pharmacy Topics Discussed, Medications and their functions          SDOH assessments and interventions completed:  No     Care Coordination Interventions:  Yes, provided   Follow up plan: Follow up call scheduled for 06/14/23 :30 AM    Encounter  Outcome:  Pt. Visit Completed

## 2023-02-13 NOTE — Patient Instructions (Addendum)
Visit Information  Thank you for taking time to visit with me today. Please don't hesitate to contact me if I can be of assistance to you.   Following are the goals we discussed today:   Goals Addressed               This Visit's Progress     Patient Stated     I want to keep my blood pressure under good control (pt-stated)        Care Coordination Interventions: Evaluation of current treatment plan related to hypertension self management and patient's adherence to plan as established by provider Counseled on the importance of exercise goals with target of 150 minutes per week Advised patient, providing education and rationale, to monitor blood pressure daily and record, calling PCP for findings outside established parameters Discussed complications of poorly controlled blood pressure such as heart disease, stroke, circulatory complications, vision complications, kidney impairment, sexual dysfunction Reviewed scheduled/upcoming provider appointments including: next scheduled PCP follow up set for 06/12/23 :40 AM Last practice recorded BP readings:  BP Readings from Last 3 Encounters:  02/09/23 136/80  11/17/22 126/62  11/15/22 122/74  Most recent eGFR/CrCl:  Lab Results  Component Value Date   EGFR 49 (L) 10/10/2022                I would like to lose weight (pt-stated)        Care Coordination Interventions: Determined patient completed both the Silver Sneakers and PREP program  Provided verbal and/or written education to patient re: provider recommended life style modifications  Reviewed and discussed 5 lb weight loss since last PCP follow up Positive reinforcement provided to patient for making lifestyle changes to improve her overall health  Educated patient on BMI range to achieve a healthy weight  Counseled on the importance of exercise goals with target of 150 minutes per week Body mass index is 30.78 kg/m.     Wt Readings from Last 3 Encounters:  02/09/23  157 lb 9.6 oz (71.5 kg)  11/17/22 162 lb 9.6 oz (73.8 kg)  11/15/22 160 lb 12.8 oz (72.9 kg)           Our next appointment is by telephone on 06/14/23 at 09:30 AM  Please call the care guide team at [phone] if you need to cancel or reschedule your appointment.   If you are experiencing a Mental Health or Behavioral Health Crisis or need someone to talk to, please call 1-800-273-TALK (toll free, 24 hour hotline) go to [facility] [address], [place] [phone])  Patient verbalizes understanding of instructions and care plan provided today and agrees to view in MyChart. Active MyChart status and patient understanding of how to access instructions and care plan via MyChart confirmed with patient.     Delsa Sale, RN, BSN, CCM Care Management Coordinator [facility] Care Management Direct Phone: [phone]

## 2023-02-16 ENCOUNTER — Encounter: Payer: Self-pay | Admitting: Internal Medicine

## 2023-02-19 ENCOUNTER — Encounter: Payer: Self-pay | Admitting: Internal Medicine

## 2023-02-19 ENCOUNTER — Encounter: Payer: Self-pay | Admitting: Hematology

## 2023-02-19 ENCOUNTER — Encounter: Payer: Self-pay | Admitting: Adult Health

## 2023-02-19 ENCOUNTER — Encounter: Payer: Self-pay | Admitting: Cardiovascular Disease

## 2023-02-28 NOTE — Telephone Encounter (Signed)
We will continue to monitor for now

## 2023-03-09 ENCOUNTER — Other Ambulatory Visit: Payer: Self-pay | Admitting: Physician Assistant

## 2023-03-09 DIAGNOSIS — D473 Essential (hemorrhagic) thrombocythemia: Secondary | ICD-10-CM

## 2023-03-10 ENCOUNTER — Other Ambulatory Visit: Payer: Self-pay

## 2023-03-16 DIAGNOSIS — F411 Generalized anxiety disorder: Secondary | ICD-10-CM | POA: Diagnosis not present

## 2023-03-22 ENCOUNTER — Telehealth: Payer: Self-pay | Admitting: Hematology

## 2023-03-22 NOTE — Telephone Encounter (Signed)
Patient is aware that previous appointment has been rescheduled.

## 2023-04-15 DIAGNOSIS — F331 Major depressive disorder, recurrent, moderate: Secondary | ICD-10-CM | POA: Diagnosis not present

## 2023-04-15 DIAGNOSIS — F4322 Adjustment disorder with anxiety: Secondary | ICD-10-CM | POA: Diagnosis not present

## 2023-04-24 ENCOUNTER — Other Ambulatory Visit: Payer: Self-pay | Admitting: Adult Health

## 2023-04-24 ENCOUNTER — Ambulatory Visit: Payer: Medicare PPO | Admitting: Hematology

## 2023-04-24 ENCOUNTER — Other Ambulatory Visit: Payer: Medicare PPO

## 2023-04-24 DIAGNOSIS — R569 Unspecified convulsions: Secondary | ICD-10-CM

## 2023-04-25 ENCOUNTER — Encounter: Payer: Self-pay | Admitting: Adult Health

## 2023-04-25 NOTE — Telephone Encounter (Signed)
[place] drug registry checked, last filled 03/24/23 60tab. Last seen 10/03/22, has upcoming 10/02/23. Refill is appropriate

## 2023-05-07 NOTE — Assessment & Plan Note (Signed)
-  JAK2(+) --diagnosed in 2010, JAK2 mutation present. Bone marrow aspirate and biopsy on 01/20/09 was non-diagnostic. However, megakaryocytes were abundant with normal morphology. There was no clustering or excess blasts seen. Platelet count on 01/20/09 was 881k.  -started Hydrea in 2010, dose adjusted as needed. Switched to anagrelide in 07/2016 due to concern about risk of leukemia. She is currently on 1mg daily, since 01/28/21, tolerating well with no noticeable side effects. 

## 2023-05-08 ENCOUNTER — Encounter: Payer: Self-pay | Admitting: Hematology

## 2023-05-08 ENCOUNTER — Inpatient Hospital Stay: Payer: Medicare PPO | Attending: Nurse Practitioner

## 2023-05-08 ENCOUNTER — Other Ambulatory Visit: Payer: Self-pay

## 2023-05-08 ENCOUNTER — Inpatient Hospital Stay: Payer: Medicare PPO | Admitting: Hematology

## 2023-05-08 VITALS — BP 151/74 | HR 60 | Temp 98.4°F | Resp 18 | Ht 60.0 in | Wt 158.1 lb

## 2023-05-08 DIAGNOSIS — Z130 Encounter for screening for diseases of the blood and blood-forming organs and certain disorders involving the immune mechanism: Secondary | ICD-10-CM

## 2023-05-08 DIAGNOSIS — D473 Essential (hemorrhagic) thrombocythemia: Secondary | ICD-10-CM | POA: Diagnosis not present

## 2023-05-08 DIAGNOSIS — Z0000 Encounter for general adult medical examination without abnormal findings: Secondary | ICD-10-CM

## 2023-05-08 LAB — CBC WITH DIFFERENTIAL/PLATELET
Abs Immature Granulocytes: 0.02 10*3/uL (ref 0.00–0.07)
Basophils Absolute: 0.1 10*3/uL (ref 0.0–0.1)
Basophils Relative: 1 %
Eosinophils Absolute: 0.2 10*3/uL (ref 0.0–0.5)
Eosinophils Relative: 3 %
HCT: 39.9 % (ref 36.0–46.0)
Hemoglobin: 13.3 g/dL (ref 12.0–15.0)
Immature Granulocytes: 0 %
Lymphocytes Relative: 22 %
Lymphs Abs: 1.2 10*3/uL (ref 0.7–4.0)
MCH: 30.2 pg (ref 26.0–34.0)
MCHC: 33.3 g/dL (ref 30.0–36.0)
MCV: 90.7 fL (ref 80.0–100.0)
Monocytes Absolute: 0.4 10*3/uL (ref 0.1–1.0)
Monocytes Relative: 8 %
Neutro Abs: 3.6 10*3/uL (ref 1.7–7.7)
Neutrophils Relative %: 66 %
Platelets: 373 10*3/uL (ref 150–400)
RBC: 4.4 MIL/uL (ref 3.87–5.11)
RDW: 13.5 % (ref 11.5–15.5)
WBC: 5.6 10*3/uL (ref 4.0–10.5)
nRBC: 0 % (ref 0.0–0.2)

## 2023-05-08 NOTE — Progress Notes (Signed)
[facility]   [phone] [phone]   Clinic Follow up Note   Patient Care Team: Dorothyann Peng, MD as PCP - General (Internal Medicine) Croitoru, Rachelle Hora, MD as PCP - Cardiology (Cardiology) Harlan Stains, [facility] (Inactive) (Pharmacist)  Date of Service:  05/08/2023  CHIEF COMPLAINT: f/u of essential thrombocythemia    CURRENT THERAPY:  Anagrelide 1mg  daily, since 01/28/21   ASSESSMENT:  Sara [name] is a 67 y.o. female with   Essential thrombocythemia (HCC) -JAK2(+) --diagnosed in 2010, JAK2 mutation present. Bone marrow aspirate and biopsy on 01/20/09 was non-diagnostic. However, megakaryocytes were abundant with normal morphology. There was no clustering or excess blasts seen. Platelet count on 01/20/09 was 881k.  -started Hydrea in 2010, dose adjusted as needed. Switched to anagrelide in 07/2016 due to concern about risk of leukemia. She is currently on 1mg  daily, since 01/28/21, tolerating well with no noticeable side effects.     PLAN: -labs -pending -Continue  Anagrelide  1 mg daily  -repeat  lab q3 months  -Echo q5 years due to pt being on anagrelide, I will order one to be done in next month  -lab and f/u in 6 months   INTERVAL HISTORY:  Sara [name] is here for a follow up of essential thrombocythemia. She was last seen by me on 10/28/2022. She presents to the clinic alone.Pt state that she is still taking  Anagrelide.    All other systems were reviewed with the patient and are negative.  MEDICAL HISTORY:  Past Medical History:  Diagnosis Date   Anxiety    Blood dyscrasia    Chronic kidney disease    Gout    Hyperlipemia    Hypertension    LVH (left ventricular hypertrophy)    12/21/09 Echo -mild asymmetric LVH,EF =>55%   Morbid obesity (HCC)    Myocardial infarction (HCC)    Nausea vomiting and diarrhea    OSA (obstructive sleep apnea)    PAD (peripheral artery disease) (HCC)    Pneumonia    Pre-diabetes     Renal artery stenosis (HCC) 12/13/2010   PTCA and Stent - right   Seizures (HCC)    Syncopal episodes    Thrombocytosis    Type 2 diabetes mellitus (HCC) 07/20/2013    SURGICAL HISTORY: Past Surgical History:  Procedure Laterality Date   BREAST CYST EXCISION     S/P Benign Right Breast Cyst Removal.   BREAST REDUCTION SURGERY  1995   S/P Bilateral breast reduction   CESAREAN SECTION  1980, 1986   Times  Two.   CORONARY ARTERY BYPASS GRAFT N/A 04/21/2017   Procedure: CORONARY ARTERY BYPASS GRAFTING (CABG) x 2, USING LEFT MAMMARY ARTERY AND RIGHT GREATER SAPHENOUS VEIN HARVESTED ENDOSCOPICALLY;  Surgeon: Alleen Borne, MD;  Location: [facility] OR;  Service: Open Heart Surgery;  Laterality: N/A;   EXTREMITY CYST EXCISION  1993   left wrist   LEFT HEART CATH AND CORONARY ANGIOGRAPHY N/A 04/17/2017   Procedure: Left Heart Cath and Coronary Angiography;  Surgeon: Corky Crafts, MD;  Location: [facility] INVASIVE CV LAB;  Service: Cardiovascular;  Laterality: N/A;   PARTIAL HYSTERECTOMY  1994   renal artery stent placement Right    TEE WITHOUT CARDIOVERSION N/A 04/21/2017   Procedure: TRANSESOPHAGEAL ECHOCARDIOGRAM (TEE);  Surgeon: Alleen Borne, MD;  Location: [facility] OR;  Service: Open Heart Surgery;  Laterality: N/A;   VENTRAL HERNIA REPAIR N/A 06/17/2021   Procedure: OPEN VENTRAL HERNIA REPAIR WITH MESH;  Surgeon:  Kinsinger, De Blanch, MD;  Location: [facility] ORS;  Service: General;  Laterality: N/A;    I have reviewed the social history and family history with the patient and they are unchanged from previous note.  ALLERGIES:  is allergic to sulfa antibiotics.  MEDICATIONS:  Current Outpatient Medications  Medication Sig Dispense Refill   amLODipine (NORVASC) 2.5 MG tablet TAKE 1 TABLET(2.5 MG) BY MOUTH DAILY 90 tablet 1   anagrelide ([name]) 1 MG capsule TAKE 1 CAPSULE(1 MG) BY MOUTH DAILY 30 capsule 5   aspirin EC 81 MG tablet Take 81 mg by mouth daily. Swallow whole.     atorvastatin  (LIPITOR) 40 MG tablet TAKE 1 TABLET(40 MG) BY MOUTH DAILY 90 tablet 3   blood glucose meter kit and supplies KIT Dispense based on patient and insurance preference. Use up to four times daily as directed. 1 each 0   Cholecalciferol (VITAMIN D3) 2000 UNITS capsule Take 2,000 Units by mouth daily.      hydrOXYzine (ATARAX) 10 MG tablet Take 10 mg by mouth daily as needed.     Lacosamide 150 MG TABS TAKE 1 TABLET(150 MG) BY MOUTH TWICE DAILY 60 tablet 6   Magnesium 250 MG TABS Take 250 mg by mouth daily.     metoprolol tartrate (LOPRESSOR) 100 MG tablet Take 1 tablet (100 mg total) by mouth 2 (two) times daily. 180 tablet 3   Multiple Vitamin (MULTIVITAMIN) capsule Take 1 capsule by mouth daily.     telmisartan (MICARDIS) 80 MG tablet Take 1 tablet (80 mg total) by mouth daily. 90 tablet 1   No current facility-administered medications for this visit.    PHYSICAL EXAMINATION: ECOG PERFORMANCE STATUS: 0 - Asymptomatic  Vitals:   05/08/23 1137  BP: (!) 151/74  Pulse: 60  Resp: 18  Temp: 98.4 F (36.9 C)  SpO2: 99%   Wt Readings from Last 3 Encounters:  05/08/23 158 lb 1.6 oz (71.7 kg)  02/09/23 157 lb 9.6 oz (71.5 kg)  11/17/22 162 lb 9.6 oz (73.8 kg)    GENERAL:alert, no distress and comfortable SKIN: skin color normal, no rashes or significant lesions EYES: normal, Conjunctiva are pink and non-injected, sclera clear  NEURO: alert & oriented x 3 with fluent speech  LABORATORY DATA:  I have reviewed the data as listed    Latest Ref Rng & Units 05/08/2023   10:59 AM 01/23/2023    8:46 AM 10/28/2022   12:51 PM  CBC  WBC 4.0 - 10.5 K/uL 5.6  5.4  6.3   Hemoglobin 12.0 - 15.0 g/dL 40.9  81.1  91.4   Hematocrit 36.0 - 46.0 % 39.9  41.4  42.9   Platelets 150 - 400 K/uL 373  395  460         Latest Ref Rng & Units 02/09/2023    9:26 AM 10/10/2022    9:43 AM 03/04/2022    2:47 PM  CMP  Glucose 70 - 99 mg/dL 94  76  96   BUN 8 - 27 mg/dL 19  16  25    Creatinine 0.57 - 1.00 mg/dL  7.82  9.56  2.13   Sodium 134 - 144 mmol/L 140  138  139   Potassium 3.5 - 5.2 mmol/L 4.4  4.8  4.5   Chloride 96 - 106 mmol/L 100  101  105   CO2 20 - 29 mmol/L 22  25  31    Calcium 8.7 - 10.3 mg/dL 08.6  9.9  9.6  Total Protein 6.0 - 8.5 g/dL 6.9  6.4  6.5   Total Bilirubin 0.0 - 1.2 mg/dL 0.5  0.4  0.5   Alkaline Phos 44 - 121 IU/L 102  96  77   AST 0 - 40 IU/L 28  24  20    ALT 0 - 32 IU/L 40  27  29       RADIOGRAPHIC STUDIES: I have personally reviewed the radiological images as listed and agreed with the findings in the report. No results found.    Orders Placed This Encounter  Procedures   Ferritin    Standing Status:   Future    Standing Expiration Date:   05/07/2024   ECHOCARDIOGRAM COMPLETE    Standing Status:   Future    Standing Expiration Date:   05/07/2024    Order Specific Question:   Where should this test be performed    Answer:   [facility]    Order Specific Question:   Perflutren DEFINITY (image enhancing agent) should be administered unless hypersensitivity or allergy exist    Answer:   Administer Perflutren    Order Specific Question:   Is a special reader required? (athlete or structural heart)    Answer:   No    Order Specific Question:   Does this study need to be read by the Structural team/Level 3 readers?    Answer:   No    Order Specific Question:   Reason for exam-Echo    Answer:   Chemo  Z09   All questions were answered. The patient knows to call the clinic with any problems, questions or concerns. No barriers to learning was detected. The total time spent in the appointment was 20 minutes.     Malachy Mood, MD 05/08/2023   Carolin Coy, CMA, am acting as scribe for Malachy Mood, MD.   I have reviewed the above documentation for accuracy and completeness, and I agree with the above.

## 2023-05-22 ENCOUNTER — Ambulatory Visit (HOSPITAL_COMMUNITY)
Admission: RE | Admit: 2023-05-22 | Discharge: 2023-05-22 | Disposition: A | Discharge status: Home / Self Care | Payer: Medicare PPO | Source: Ambulatory Visit | Attending: Hematology | Admitting: Hematology

## 2023-05-22 DIAGNOSIS — I429 Cardiomyopathy, unspecified: Secondary | ICD-10-CM | POA: Diagnosis not present

## 2023-05-22 DIAGNOSIS — Z0189 Encounter for other specified special examinations: Secondary | ICD-10-CM | POA: Diagnosis not present

## 2023-05-22 DIAGNOSIS — D473 Essential (hemorrhagic) thrombocythemia: Secondary | ICD-10-CM | POA: Diagnosis not present

## 2023-05-22 DIAGNOSIS — I130 Hypertensive heart and chronic kidney disease with heart failure and stage 1 through stage 4 chronic kidney disease, or unspecified chronic kidney disease: Secondary | ICD-10-CM | POA: Insufficient documentation

## 2023-05-22 DIAGNOSIS — I509 Heart failure, unspecified: Secondary | ICD-10-CM | POA: Insufficient documentation

## 2023-05-22 DIAGNOSIS — I700 Atherosclerosis of aorta: Secondary | ICD-10-CM | POA: Insufficient documentation

## 2023-05-22 DIAGNOSIS — I2510 Atherosclerotic heart disease of native coronary artery without angina pectoris: Secondary | ICD-10-CM | POA: Diagnosis not present

## 2023-05-22 DIAGNOSIS — N189 Chronic kidney disease, unspecified: Secondary | ICD-10-CM | POA: Diagnosis not present

## 2023-05-22 DIAGNOSIS — D75839 Thrombocytosis, unspecified: Secondary | ICD-10-CM | POA: Insufficient documentation

## 2023-05-22 DIAGNOSIS — E1122 Type 2 diabetes mellitus with diabetic chronic kidney disease: Secondary | ICD-10-CM | POA: Diagnosis not present

## 2023-05-22 DIAGNOSIS — Z951 Presence of aortocoronary bypass graft: Secondary | ICD-10-CM | POA: Diagnosis not present

## 2023-05-22 DIAGNOSIS — I252 Old myocardial infarction: Secondary | ICD-10-CM | POA: Insufficient documentation

## 2023-05-22 LAB — ECHOCARDIOGRAM COMPLETE
AR max vel: 2.34 cm2
AV Area VTI: 2.47 cm2
AV Area mean vel: 2.47 cm2
AV Mean grad: 7 mmHg
AV Peak grad: 12.4 mmHg
Ao pk vel: 1.76 m/s
Area-P 1/2: 3.24 cm2
Calc EF: 64.6 %
S' Lateral: 3 cm
Single Plane A2C EF: 63.9 %
Single Plane A4C EF: 65.3 %

## 2023-06-02 DIAGNOSIS — Z1231 Encounter for screening mammogram for malignant neoplasm of breast: Secondary | ICD-10-CM | POA: Diagnosis not present

## 2023-06-02 LAB — HM MAMMOGRAPHY

## 2023-06-05 ENCOUNTER — Telehealth: Payer: Self-pay

## 2023-06-05 NOTE — Telephone Encounter (Addendum)
Called patient to relay message below. Patient voiced full understanding.   ----- Message from Malachy Mood sent at 06/04/2023 10:28 PM EDT ----- Please let pt know her echo result, overall no concerns, she has mild diastolic dysfunction which is probably related to her HTN, thanks.   Malachy Mood

## 2023-06-12 ENCOUNTER — Ambulatory Visit (INDEPENDENT_AMBULATORY_CARE_PROVIDER_SITE_OTHER): Payer: Medicare PPO | Admitting: Internal Medicine

## 2023-06-12 ENCOUNTER — Encounter: Payer: Self-pay | Admitting: Internal Medicine

## 2023-06-12 VITALS — BP 124/70 | HR 62 | Temp 97.9°F | Ht 60.0 in | Wt 158.2 lb

## 2023-06-12 DIAGNOSIS — N1831 Chronic kidney disease, stage 3a: Secondary | ICD-10-CM

## 2023-06-12 DIAGNOSIS — E2839 Other primary ovarian failure: Secondary | ICD-10-CM

## 2023-06-12 DIAGNOSIS — G4733 Obstructive sleep apnea (adult) (pediatric): Secondary | ICD-10-CM

## 2023-06-12 DIAGNOSIS — I130 Hypertensive heart and chronic kidney disease with heart failure and stage 1 through stage 4 chronic kidney disease, or unspecified chronic kidney disease: Secondary | ICD-10-CM

## 2023-06-12 DIAGNOSIS — E1122 Type 2 diabetes mellitus with diabetic chronic kidney disease: Secondary | ICD-10-CM

## 2023-06-12 DIAGNOSIS — Z0000 Encounter for general adult medical examination without abnormal findings: Secondary | ICD-10-CM

## 2023-06-12 DIAGNOSIS — R569 Unspecified convulsions: Secondary | ICD-10-CM

## 2023-06-12 DIAGNOSIS — I5032 Chronic diastolic (congestive) heart failure: Secondary | ICD-10-CM | POA: Diagnosis not present

## 2023-06-12 DIAGNOSIS — I1310 Hypertensive heart and chronic kidney disease without heart failure, with stage 1 through stage 4 chronic kidney disease, or unspecified chronic kidney disease: Secondary | ICD-10-CM | POA: Diagnosis not present

## 2023-06-12 DIAGNOSIS — N1832 Chronic kidney disease, stage 3b: Secondary | ICD-10-CM | POA: Insufficient documentation

## 2023-06-12 DIAGNOSIS — E7800 Pure hypercholesterolemia, unspecified: Secondary | ICD-10-CM

## 2023-06-12 DIAGNOSIS — Z6830 Body mass index (BMI) 30.0-30.9, adult: Secondary | ICD-10-CM

## 2023-06-12 DIAGNOSIS — E6609 Other obesity due to excess calories: Secondary | ICD-10-CM

## 2023-06-12 LAB — POCT URINALYSIS DIPSTICK
Bilirubin, UA: NEGATIVE
Glucose, UA: NEGATIVE
Ketones, UA: NEGATIVE
Leukocytes, UA: NEGATIVE
Nitrite, UA: NEGATIVE
Protein, UA: NEGATIVE
Spec Grav, UA: 1.015 (ref 1.010–1.025)
Urobilinogen, UA: 0.2 E.U./dL
pH, UA: 5.5 (ref 5.0–8.0)

## 2023-06-12 MED ORDER — TELMISARTAN 80 MG PO TABS
80.0000 mg | ORAL_TABLET | Freq: Every day | ORAL | 2 refills | Status: DC
Start: 2023-06-12 — End: 2024-01-29

## 2023-06-12 MED ORDER — AMLODIPINE BESYLATE 2.5 MG PO TABS
2.5000 mg | ORAL_TABLET | Freq: Every day | ORAL | 2 refills | Status: DC
Start: 2023-06-12 — End: 2024-01-29

## 2023-06-12 MED ORDER — METOPROLOL TARTRATE 100 MG PO TABS
100.0000 mg | ORAL_TABLET | Freq: Two times a day (BID) | ORAL | 3 refills | Status: DC
Start: 2023-06-12 — End: 2023-12-21

## 2023-06-12 MED ORDER — ATORVASTATIN CALCIUM 40 MG PO TABS
40.0000 mg | ORAL_TABLET | Freq: Every day | ORAL | 3 refills | Status: DC
Start: 2023-06-12 — End: 2024-06-10

## 2023-06-12 NOTE — Progress Notes (Signed)
I,Sara [name], CMA,acting as a Neurosurgeon for Sara Aliment, MD.,have documented all relevant documentation on the behalf of Sara Aliment, MD,as directed by  Sara Aliment, MD while in the presence of Sara Aliment, MD.  Subjective:    Patient ID: Sara [name] , female    DOB: [birth year] , 67 y.o.   MRN: [id number]  Chief Complaint  Patient presents with   Annual Exam   Diabetes   Hypertension    HPI  Patient presents today for annual exam. She is no longer followed by Gynecology. She is s/p hysterectomy.  She reports compliance with medications. Denies headache, chest pain, and SOB. She admits she is not checking her sugars regularly.   She reports she will schedule her eye exam in the near future. She admits she is under a lot of stress.  She is the main care-taker of her son that suffers from anxiety & depression.  Additionally, her husband has a recent illness that affected his ability to walk.       Diabetes She presents for her follow-up diabetic visit. She has type 2 diabetes mellitus. Her disease course has been stable. There are no hypoglycemic associated symptoms. There are no diabetic associated symptoms. Pertinent negatives for diabetes include no blurred vision and no chest pain. There are no hypoglycemic complications. Diabetic complications include heart disease and nephropathy. Risk factors for coronary artery disease include diabetes mellitus, dyslipidemia, hypertension, obesity, post-menopausal and sedentary lifestyle. She is compliant with treatment most of the time. She is following a diabetic diet. She participates in exercise three times a week. Her breakfast blood glucose is taken between 7-8 am. Her breakfast blood glucose range is generally 90-110 mg/dl. An ACE inhibitor/angiotensin II receptor blocker is being taken. Eye exam is current.  Hypertension This is a chronic problem. The current episode started more than 1 year ago. The problem has been  gradually improving since onset. The problem is uncontrolled. Pertinent negatives include no blurred vision, chest pain, palpitations or shortness of breath. The current treatment provides moderate improvement. Compliance problems include exercise.  Hypertensive end-organ damage includes CAD/MI.     Past Medical History:  Diagnosis Date   Anxiety    Blood dyscrasia    Chronic kidney disease    Gout    Hyperlipemia    Hypertension    LVH (left ventricular hypertrophy)    12/21/09 Echo -mild asymmetric LVH,EF =>55%   Morbid obesity (HCC)    Myocardial infarction (HCC)    Nausea vomiting and diarrhea    OSA (obstructive sleep apnea)    PAD (peripheral artery disease) (HCC)    Pneumonia    Pre-diabetes    Renal artery stenosis (HCC) 12/13/2010   PTCA and Stent - right   Seizures (HCC)    Syncopal episodes    Thrombocytosis    Type 2 diabetes mellitus (HCC) 07/20/2013     Family History  Problem Relation Age of Onset   Heart failure Mother    Diabetes Father    Diabetes Sister    Hypertension Sister    Hypertension Sister    Hypertension Sister    Breast cancer Maternal Aunt        2 maternal aunts had breast cancer.   Breast cancer Maternal Grandmother    Sleep apnea Neg Hx      Current Outpatient Medications:    anagrelide ([name]) 1 MG capsule, TAKE 1 CAPSULE(1 MG) BY MOUTH DAILY, Disp: 30 capsule, Rfl: 5  aspirin EC 81 MG tablet, Take 81 mg by mouth daily. Swallow whole., Disp: , Rfl:    blood glucose meter kit and supplies KIT, Dispense based on patient and insurance preference. Use up to four times daily as directed., Disp: 1 each, Rfl: 0   Cholecalciferol (VITAMIN D3) 2000 UNITS capsule, Take 2,000 Units by mouth daily. , Disp: , Rfl:    hydrOXYzine (ATARAX) 10 MG tablet, Take 10 mg by mouth daily as needed., Disp: , Rfl:    Lacosamide 150 MG TABS, TAKE 1 TABLET(150 MG) BY MOUTH TWICE DAILY, Disp: 60 tablet, Rfl: 6   Magnesium 250 MG TABS, Take 250 mg by mouth  daily., Disp: , Rfl:    Multiple Vitamin (MULTIVITAMIN) capsule, Take 1 capsule by mouth daily., Disp: , Rfl:    amLODipine (NORVASC) 2.5 MG tablet, Take 1 tablet (2.5 mg total) by mouth daily., Disp: 90 tablet, Rfl: 2   atorvastatin (LIPITOR) 40 MG tablet, Take 1 tablet (40 mg total) by mouth daily., Disp: 90 tablet, Rfl: 3   metoprolol tartrate (LOPRESSOR) 100 MG tablet, Take 1 tablet (100 mg total) by mouth 2 (two) times daily., Disp: 180 tablet, Rfl: 3   telmisartan (MICARDIS) 80 MG tablet, Take 1 tablet (80 mg total) by mouth daily., Disp: 90 tablet, Rfl: 2   Allergies  Allergen Reactions   Sulfa Antibiotics Itching      The patient states she uses post menopausal status for birth control. No LMP recorded. Patient has had a hysterectomy.. Negative for Dysmenorrhea. Negative for: breast discharge, breast lump(s), breast pain and breast self exam. Associated symptoms include abnormal vaginal bleeding. Pertinent negatives include abnormal bleeding (hematology), anxiety, decreased libido, depression, difficulty falling sleep, dyspareunia, history of infertility, nocturia, sexual dysfunction, sleep disturbances, urinary incontinence, urinary urgency, vaginal discharge and vaginal itching. Diet regular.The patient states her exercise level is  moderate, 30 minutes five days per week.   . The patient's tobacco use is:  Social History   Tobacco Use  Smoking Status Never  Smokeless Tobacco Never  . She has been exposed to passive smoke. The patient's alcohol use is:  Social History   Substance and Sexual Activity  Alcohol Use No   Alcohol/week: 0.0 standard drinks of alcohol   Review of Systems  Constitutional: Negative.   HENT: Negative.    Eyes: Negative.  Negative for blurred vision.  Respiratory: Negative.  Negative for shortness of breath.   Cardiovascular: Negative.  Negative for chest pain and palpitations.  Gastrointestinal: Negative.   Endocrine: Negative.   Genitourinary:  Negative.   Musculoskeletal: Negative.   Skin: Negative.   Allergic/Immunologic: Negative.   Neurological: Negative.   Hematological: Negative.   Psychiatric/Behavioral: Negative.       Today's Vitals   06/12/23 0844 06/12/23 0900  BP: (!) 142/78 124/70  Pulse: 62   Temp: 97.9 F (36.6 C)   SpO2: 98%   Weight: 158 lb 3.2 oz (71.8 kg)   Height: 5' (1.524 m)    Body mass index is 30.9 kg/m.  Wt Readings from Last 3 Encounters:  06/12/23 158 lb 3.2 oz (71.8 kg)  05/08/23 158 lb 1.6 oz (71.7 kg)  02/09/23 157 lb 9.6 oz (71.5 kg)     Objective:  Physical Exam Constitutional:      General: She is not in acute distress.    Appearance: Normal appearance. She is well-developed. She is obese.  HENT:     Head: Normocephalic and atraumatic.     Right Ear:  Hearing, tympanic membrane, ear canal and external ear normal. There is no impacted cerumen.     Left Ear: Hearing, tympanic membrane, ear canal and external ear normal. There is no impacted cerumen.     Nose:     Comments: Deferred - masked    Mouth/Throat:     Pharynx: Oropharynx is clear. No oropharyngeal exudate or posterior oropharyngeal erythema.  Eyes:     General: Lids are normal.     Extraocular Movements: Extraocular movements intact.     Conjunctiva/sclera: Conjunctivae normal.     Pupils: Pupils are equal, round, and reactive to light.     Funduscopic exam:    Right eye: No papilledema.        Left eye: No papilledema.  Neck:     Thyroid: No thyroid mass.     Vascular: No carotid bruit.  Cardiovascular:     Rate and Rhythm: Normal rate and regular rhythm.     Pulses: Normal pulses.          Dorsalis pedis pulses are 2+ on the right side and 2+ on the left side.     Heart sounds: Normal heart sounds. No murmur heard. Pulmonary:     Effort: Pulmonary effort is normal.     Breath sounds: Normal breath sounds.  Chest:     Chest wall: No mass.  Breasts:    Tanner Score is 5.     Right: Normal. No mass or  tenderness.     Left: Normal. No mass or tenderness.     Comments: Healed sternal scar Abdominal:     General: Abdomen is flat. Bowel sounds are normal. There is no distension.     Palpations: Abdomen is soft.     Tenderness: There is no abdominal tenderness.  Genitourinary:    Rectum: Guaiac result negative.  Musculoskeletal:        General: No swelling. Normal range of motion.     Cervical back: Full passive range of motion without pain, normal range of motion and neck supple.     Right lower leg: No edema.     Left lower leg: No edema.  Feet:     Right foot:     Protective Sensation: 5 sites tested.  5 sites sensed.     Skin integrity: Dry skin present.     Toenail Condition: Right toenails are normal.     Left foot:     Protective Sensation: 5 sites tested.  5 sites sensed.     Skin integrity: Dry skin present.     Toenail Condition: Left toenails are long.  Lymphadenopathy:     Upper Body:     Right upper body: No supraclavicular, axillary or pectoral adenopathy.     Left upper body: No supraclavicular, axillary or pectoral adenopathy.  Skin:    General: Skin is warm and dry.     Capillary Refill: Capillary refill takes less than 2 seconds.  Neurological:     General: No focal deficit present.     Mental Status: She is alert and oriented to person, place, and time.     Cranial Nerves: No cranial nerve deficit.     Sensory: No sensory deficit.  Psychiatric:        Mood and Affect: Mood normal.        Behavior: Behavior normal.        Thought Content: Thought content normal.        Judgment: Judgment normal.  Assessment And Plan:     Routine general medical examination at health care facility Assessment & Plan: A full exam was performed.  Importance of monthly self breast exams was discussed with the patient.  She is advised to get 30-45 minutes of regular exercise, no less than four to five days per week. Both weight-bearing and aerobic exercises are  recommended.  She is advised to follow a healthy diet with at least six fruits/veggies per day, decrease intake of red meat and other saturated fats and to increase fish intake to twice weekly.  Meats/fish should not be fried -- baked, boiled or broiled is preferable. It is also important to cut back on your sugar intake.  Be sure to read labels - try to avoid anything with added sugar, high fructose corn syrup or other sweeteners.  If you must use a sweetener, you can try stevia or monkfruit.  It is also important to avoid artificially sweetened foods/beverages and diet drinks. Lastly, wear SPF 50 sunscreen on exposed skin and when in direct sunlight for an extended period of time.  Be sure to avoid fast food restaurants and aim for at least 60 ounces of water daily.       Type 2 diabetes mellitus with stage 3a chronic kidney disease, without long-term current use of insulin (HCC) Assessment & Plan: Chronic, no longer requires medication. Foot exam was performed. I DISCUSSED WITH THE PATIENT AT LENGTH REGARDING THE GOALS OF GLYCEMIC CONTROL AND POSSIBLE LONG-TERM COMPLICATIONS.  I  ALSO STRESSED THE IMPORTANCE OF COMPLIANCE WITH HOME GLUCOSE MONITORING, DIETARY RESTRICTIONS INCLUDING AVOIDANCE OF SUGARY DRINKS/PROCESSED FOODS,  ALONG WITH REGULAR EXERCISE.  I  ALSO STRESSED THE IMPORTANCE OF ANNUAL EYE EXAMS, SELF FOOT CARE AND COMPLIANCE WITH OFFICE VISITS.   Orders: -     CMP14+EGFR -     Lipid panel -     Hemoglobin A1c -     POCT urinalysis dipstick -     Microalbumin / creatinine urine ratio -     EKG 12-Lead -     amLODIPine Besylate; Take 1 tablet (2.5 mg total) by mouth daily.  Dispense: 90 tablet; Refill: 2 -     TSH  Hypertensive heart and renal disease with renal failure, stage 1 through stage 4 or unspecified chronic kidney disease, with heart failure (HCC) Assessment & Plan: Chronic, controlled. EKG performed, SB w/ first degree A-V block and old anteroseptal infarct. She will  continue with metoprolol tartrate 100mg  twice daily, amlodipine 2.5mg  and telmisartan 80mg  daily. She is encouraged to follow low sodium diet. She agrees to f/u in six months for re-evaluation.    Orders: -     CMP14+EGFR -     Lipid panel -     POCT urinalysis dipstick -     Microalbumin / creatinine urine ratio -     EKG 12-Lead -     Atorvastatin Calcium; Take 1 tablet (40 mg total) by mouth daily.  Dispense: 90 tablet; Refill: 3 -     Metoprolol Tartrate; Take 1 tablet (100 mg total) by mouth 2 (two) times daily.  Dispense: 180 tablet; Refill: 3 -     Telmisartan; Take 1 tablet (80 mg total) by mouth daily.  Dispense: 90 tablet; Refill: 2  Chronic diastolic congestive heart failure (HCC)  OSA (obstructive sleep apnea) Assessment & Plan: Chronic, importance of CPAP compliance was discussed with the patient in full detail. She is encouraged to wear at least four hours per night.  Estrogen deficiency Assessment & Plan: I will schedule her for bone density. She is encouraged to engage in weight-bearing exercises at least twice weekly. Will make further recommendations once I have reviewed her results.   Orders: -     DG Bone Density; Future  Pure hypercholesterolemia Assessment & Plan: Chronic, LDL goal is less than 70 due to underlying CAD.  She will continue with atorvastatin 40mg  daily. She is encouraged to follow a heart healthy diet.    Seizures (HCC) Assessment & Plan: Chronic, also followed by Neuro. She will contiue with [name].    Class 1 obesity due to excess calories with serious comorbidity and body mass index (BMI) of 30.0 to 30.9 in adult Assessment & Plan: Her BMI is acceptable for his demographic. She is encouraged to aim for at least 150 minutes of exercise/week.    Chronic diastolic CHF (congestive heart failure) (HCC) Assessment & Plan: Chronic, sx are stable. July 2024 echo results reviewed in detail. She is encouraged to follow a low sodium diet.  Most recent Cardiology notes reviewed (2023).   She is encouraged to strive for BMI less than 30 to decrease cardiac risk. Advised to aim for at least 150 minutes of exercise per week.    Return in 4 months (on 10/12/2023), or dm check, for 1 YEAR HM, 4 month dm. Patient was given opportunity to ask questions. Patient verbalized understanding of the plan and was able to repeat key elements of the plan. All questions were answered to their satisfaction.   I, Sara Aliment, MD, have reviewed all documentation for this visit. The documentation on 06/12/23 for the exam, diagnosis, procedures, and orders are all accurate and complete.

## 2023-06-12 NOTE — Patient Instructions (Signed)

## 2023-06-13 LAB — CMP14+EGFR
ALT: 29 IU/L (ref 0–32)
AST: 22 IU/L (ref 0–40)
Albumin: 3.7 g/dL — ABNORMAL LOW (ref 3.9–4.9)
Alkaline Phosphatase: 94 IU/L (ref 44–121)
BUN/Creatinine Ratio: 17 (ref 12–28)
BUN: 20 mg/dL (ref 8–27)
Bilirubin Total: 0.4 mg/dL (ref 0.0–1.2)
CO2: 26 mmol/L (ref 20–29)
Calcium: 10.1 mg/dL (ref 8.7–10.3)
Chloride: 100 mmol/L (ref 96–106)
Creatinine, Ser: 1.17 mg/dL — ABNORMAL HIGH (ref 0.57–1.00)
Globulin, Total: 2.6 g/dL (ref 1.5–4.5)
Glucose: 62 mg/dL — ABNORMAL LOW (ref 70–99)
Potassium: 4.6 mmol/L (ref 3.5–5.2)
Sodium: 137 mmol/L (ref 134–144)
Total Protein: 6.3 g/dL (ref 6.0–8.5)
eGFR: 51 mL/min/{1.73_m2} — ABNORMAL LOW (ref >59)

## 2023-06-13 LAB — HEMOGLOBIN A1C
Est. average glucose Bld gHb Est-mCnc: 131 mg/dL
Hgb A1c MFr Bld: 6.2 % — ABNORMAL HIGH (ref 4.8–5.6)

## 2023-06-13 LAB — LIPID PANEL
Chol/HDL Ratio: 2 ratio (ref 0.0–4.4)
Cholesterol, Total: 108 mg/dL (ref 100–199)
HDL: 55 mg/dL (ref >39)
LDL Chol Calc (NIH): 44 mg/dL (ref 0–99)
Triglycerides: 30 mg/dL (ref 0–149)
VLDL Cholesterol Cal: 9 mg/dL (ref 5–40)

## 2023-06-13 LAB — MICROALBUMIN / CREATININE URINE RATIO
Creatinine, Urine: 28.4 mg/dL
Microalb/Creat Ratio: <11 mg/g{creat} (ref 0–29)
Microalbumin, Urine: <3 ug/mL

## 2023-06-13 LAB — TSH: TSH: 2.39 u[IU]/mL (ref 0.450–4.500)

## 2023-06-14 ENCOUNTER — Ambulatory Visit: Payer: Self-pay

## 2023-06-14 NOTE — Patient Outreach (Signed)
  Care Coordination   06/14/2023 Name: Sara [name] MRN: [id number] DOB: January 07, 1956   Care Coordination Outreach Attempts:  An unsuccessful telephone outreach was attempted for a scheduled appointment today.  Follow Up Plan:  Additional outreach attempts will be made to offer the patient care coordination information and services.   Encounter Outcome:  No Answer   Care Coordination Interventions:  No, not indicated    Delsa Sale, RN, BSN, CCM Care Management Coordinator [facility] Care Management  Direct Phone: [phone]

## 2023-06-22 DIAGNOSIS — E6609 Other obesity due to excess calories: Secondary | ICD-10-CM | POA: Insufficient documentation

## 2023-06-22 DIAGNOSIS — E2839 Other primary ovarian failure: Secondary | ICD-10-CM | POA: Insufficient documentation

## 2023-06-22 DIAGNOSIS — E7800 Pure hypercholesterolemia, unspecified: Secondary | ICD-10-CM | POA: Insufficient documentation

## 2023-06-22 NOTE — Assessment & Plan Note (Signed)
Chronic, importance of CPAP compliance was discussed with the patient in full detail. She is encouraged to wear at least four hours per night.

## 2023-06-22 NOTE — Assessment & Plan Note (Signed)
Chronic, no longer requires medication. Foot exam was performed. I DISCUSSED WITH THE PATIENT AT LENGTH REGARDING THE GOALS OF GLYCEMIC CONTROL AND POSSIBLE LONG-TERM COMPLICATIONS.  I  ALSO STRESSED THE IMPORTANCE OF COMPLIANCE WITH HOME GLUCOSE MONITORING, DIETARY RESTRICTIONS INCLUDING AVOIDANCE OF SUGARY DRINKS/PROCESSED FOODS,  ALONG WITH REGULAR EXERCISE.  I  ALSO STRESSED THE IMPORTANCE OF ANNUAL EYE EXAMS, SELF FOOT CARE AND COMPLIANCE WITH OFFICE VISITS.

## 2023-06-22 NOTE — Assessment & Plan Note (Signed)
Chronic, also followed by Neuro. She will contiue with [name].

## 2023-06-22 NOTE — Assessment & Plan Note (Addendum)
Chronic, controlled. EKG performed, SB w/ first degree A-V block and old anteroseptal infarct. She will continue with metoprolol tartrate 100mg  twice daily, amlodipine 2.5mg  and telmisartan 80mg  daily. She is encouraged to follow low sodium diet. She agrees to f/u in six months for re-evaluation.

## 2023-06-22 NOTE — Assessment & Plan Note (Addendum)

## 2023-06-22 NOTE — Assessment & Plan Note (Signed)
Chronic, sx are stable. July 2024 echo results reviewed in detail. She is encouraged to follow a low sodium diet. Most recent Cardiology notes reviewed (2023).

## 2023-06-22 NOTE — Assessment & Plan Note (Signed)
Her BMI is acceptable for his demographic. She is encouraged to aim for at least 150 minutes of exercise/week.

## 2023-06-22 NOTE — Assessment & Plan Note (Signed)
Chronic, LDL goal is less than 70 due to underlying CAD.  She will continue with atorvastatin 40mg  daily. She is encouraged to follow a heart healthy diet.

## 2023-06-22 NOTE — Assessment & Plan Note (Signed)
I will schedule her for bone density. She is encouraged to engage in weight-bearing exercises at least twice weekly. Will make further recommendations once I have reviewed her results.

## 2023-06-23 ENCOUNTER — Telehealth: Payer: Self-pay | Admitting: *Deleted

## 2023-06-23 NOTE — Progress Notes (Unsigned)
  Care Coordination Note  06/23/2023 Name: Sara [name] MRN: [id number] DOB: 07/24/1956  Sara [name] is a 67 y.o. year old female who is a primary care patient of Dorothyann Peng, MD and is actively engaged with the care management team. I reached out to Sara [name] by phone today to assist with re-scheduling a follow up visit with the RN Case Manager  Follow up plan: Unsuccessful telephone outreach attempt made. A HIPAA compliant phone message was left for the patient providing contact information and requesting a return call.   [facility]  Care Coordination Care Guide  Direct Dial: [phone]

## 2023-06-27 NOTE — Progress Notes (Signed)
  Care Coordination Note  06/27/2023 Name: Sara [name] MRN: [id number] DOB: Jul 02, 1956  Sara [name] is a 67 y.o. year old female who is a primary care patient of Dorothyann Peng, MD and is actively engaged with the care management team. I reached out to Sara [name] by phone today to assist with re-scheduling a follow up visit with the RN Case Manager  Follow up plan: Telephone appointment with care management team member scheduled for:07/06/23  [facility] Coordination Care Guide  Direct Dial: [phone]

## 2023-06-30 ENCOUNTER — Encounter: Payer: Self-pay | Admitting: Internal Medicine

## 2023-07-06 ENCOUNTER — Ambulatory Visit: Payer: Self-pay

## 2023-07-07 NOTE — Patient Outreach (Signed)
Care Coordination   Follow Up Visit Note   07/06/2023 Name: Sara [name] MRN: [id number] DOB: 06-20-1956  Sara [name] is a 67 y.o. year old female who sees Dorothyann Peng, MD for primary care. I spoke with  Sara [name] by phone today.  What matters to the patients health and wellness today?  Patient would like to resume her exercise regimen. She would like to speak with a SW for counseling and resources to help relieve her caregiver stress.     Goals Addressed               This Visit's Progress     Patient Stated     I want to keep my blood pressure under good control (pt-stated)   On track     Care Coordination Interventions: Evaluation of current treatment plan related to hypertension self management and patient's adherence to plan as established by provider Review of patient status, including review of consultant's reports, relevant laboratory and other test results, and medications completed Counseled on the importance of exercise goals with target of 150 minutes per week Advised patient, providing education and rationale, to monitor blood pressure daily and record, calling PCP for findings outside established parameters Last practice recorded BP readings:  BP Readings from Last 3 Encounters:  06/12/23 124/70  05/08/23 (!) 151/74  02/09/23 136/80   Most recent eGFR/CrCl:  Lab Results  Component Value Date   EGFR 51 (L) 06/12/2023    No components found for: "CRCL"      Other     To improve caregiver stress        Care Coordination Interventions: Evaluation of current treatment plan related to caregiver stress and patient's adherence to plan as established by provider Determined patient is experiencing caregiver stress due to caring for her adult son who suffers from mental health issues Motivational Interviewing employed Active listening / Reflection utilized  Emotional Support Provided Problem Solving /Task Center strategies reviewed Sent referral  to Jenel Lucks LCSW to request re-engaging with patient per her request for resources and counseling       To work on lowering A1c        Care Coordination Interventions: Provided education to patient about basic DM disease process Reviewed medications with patient and discussed importance of medication adherence Review of patient status, including review of consultants reports, relevant laboratory and other test results, and medications completed Counseled on Diabetic diet, my plate method, 409 minutes of moderate intensity exercise/week Mailed printed educational materials related to diabetes  Lab Results  Component Value Date   HGBA1C 6.2 (H) 06/12/2023           Interventions Today    Flowsheet Row Most Recent Value  Chronic Disease   Chronic disease during today's visit Hypertension (HTN), Chronic Kidney Disease/End Stage Renal Disease (ESRD), Other  [anxiety]  General Interventions   General Interventions Discussed/Reviewed General Interventions Discussed, General Interventions Reviewed, Doctor Visits, Labs, Communication with  Doctor Visits Discussed/Reviewed Doctor Visits Discussed, Doctor Visits Reviewed, PCP  Communication with Social Work  Jenel Lucks LCSW referral sent for resources and counseling]  Exercise Interventions   Exercise Discussed/Reviewed Exercise Discussed, Exercise Reviewed, Physical Activity  Physical Activity Discussed/Reviewed Physical Activity Reviewed, Physical Activity Discussed, Types of exercise  Education Interventions   Education Provided Provided Education  Provided Verbal Education On Labs, Exercise, When to see the doctor  Mental Health Interventions   Mental Health Discussed/Reviewed Mental Health Discussed, Mental Health Reviewed, Anxiety, Refer to  Social Work for resources, Research scientist (physical sciences) to Social Work for counseling  Jenel Lucks LCSW]  Refer to Social Work for counseling regarding Anxiety/Coping  Nutrition Interventions   Nutrition  Discussed/Reviewed Nutrition Discussed, Nutrition Reviewed, Fluid intake          SDOH assessments and interventions completed:  No     Care Coordination Interventions:  Yes, provided   Follow up plan: Referral made to LCSW Jenel Lucks for counseling and resources  Follow up call scheduled for 10/27/23 @09 :30 AM    Encounter Outcome:  Patient Visit Completed

## 2023-07-07 NOTE — Patient Instructions (Signed)
Visit Information  Thank you for taking time to visit with me today. Please don't hesitate to contact me if I can be of assistance to you.   Following are the goals we discussed today:   Goals Addressed               This Visit's Progress     Patient Stated     I want to keep my blood pressure under good control (pt-stated)   On track     Care Coordination Interventions: Evaluation of current treatment plan related to hypertension self management and patient's adherence to plan as established by provider Review of patient status, including review of consultant's reports, relevant laboratory and other test results, and medications completed Counseled on the importance of exercise goals with target of 150 minutes per week Advised patient, providing education and rationale, to monitor blood pressure daily and record, calling PCP for findings outside established parameters Last practice recorded BP readings:  BP Readings from Last 3 Encounters:  06/12/23 124/70  05/08/23 (!) 151/74  02/09/23 136/80   Most recent eGFR/CrCl:  Lab Results  Component Value Date   EGFR 51 (L) 06/12/2023    No components found for: "CRCL"      Other     To improve caregiver stress        Care Coordination Interventions: Evaluation of current treatment plan related to caregiver stress and patient's adherence to plan as established by provider Determined patient is experiencing caregiver stress due to caring for her adult son who suffers from mental health issues Motivational Interviewing employed Active listening / Reflection utilized  Emotional Support Provided Problem Solving /Task Center strategies reviewed Sent referral to Jenel Lucks LCSW to request re-engaging with patient per her request for resources and counseling        To work on lowering A1c        Care Coordination Interventions: Provided education to patient about basic DM disease process Reviewed medications with patient and  discussed importance of medication adherence Review of patient status, including review of consultants reports, relevant laboratory and other test results, and medications completed Counseled on Diabetic diet, my plate method, 161 minutes of moderate intensity exercise/week Mailed printed educational materials related to diabetes  Lab Results  Component Value Date   HGBA1C 6.2 (H) 06/12/2023            Our next appointment is by telephone on 10/27/23 at 09:30 AM  Please call the care guide team at [phone] if you need to cancel or reschedule your appointment.   If you are experiencing a Mental Health or Behavioral Health Crisis or need someone to talk to, please call 1-800-273-TALK (toll free, 24 hour hotline)  Patient verbalizes understanding of instructions and care plan provided today and agrees to view in MyChart. Active MyChart status and patient understanding of how to access instructions and care plan via MyChart confirmed with patient.     Delsa Sale RN BSN CCM [place]  [facility], [facility] Health Nurse Care Coordinator  Direct Dial: [phone] Website: [email or web address]

## 2023-07-08 DIAGNOSIS — F411 Generalized anxiety disorder: Secondary | ICD-10-CM | POA: Diagnosis not present

## 2023-07-08 DIAGNOSIS — F4322 Adjustment disorder with anxiety: Secondary | ICD-10-CM | POA: Diagnosis not present

## 2023-07-24 ENCOUNTER — Other Ambulatory Visit: Payer: Self-pay

## 2023-07-24 DIAGNOSIS — Z130 Encounter for screening for diseases of the blood and blood-forming organs and certain disorders involving the immune mechanism: Secondary | ICD-10-CM

## 2023-07-24 DIAGNOSIS — D473 Essential (hemorrhagic) thrombocythemia: Secondary | ICD-10-CM

## 2023-07-25 ENCOUNTER — Inpatient Hospital Stay: Payer: Medicare PPO | Attending: Nurse Practitioner

## 2023-07-25 DIAGNOSIS — Z135 Encounter for screening for eye and ear disorders: Secondary | ICD-10-CM | POA: Diagnosis not present

## 2023-07-25 DIAGNOSIS — H2513 Age-related nuclear cataract, bilateral: Secondary | ICD-10-CM | POA: Diagnosis not present

## 2023-07-25 DIAGNOSIS — Z130 Encounter for screening for diseases of the blood and blood-forming organs and certain disorders involving the immune mechanism: Secondary | ICD-10-CM

## 2023-07-25 DIAGNOSIS — H5203 Hypermetropia, bilateral: Secondary | ICD-10-CM | POA: Diagnosis not present

## 2023-07-25 DIAGNOSIS — H524 Presbyopia: Secondary | ICD-10-CM | POA: Diagnosis not present

## 2023-07-25 DIAGNOSIS — H52223 Regular astigmatism, bilateral: Secondary | ICD-10-CM | POA: Diagnosis not present

## 2023-07-25 DIAGNOSIS — D473 Essential (hemorrhagic) thrombocythemia: Secondary | ICD-10-CM | POA: Insufficient documentation

## 2023-07-25 LAB — CBC WITH DIFFERENTIAL (CANCER CENTER ONLY)
Abs Immature Granulocytes: 0.02 10*3/uL (ref 0.00–0.07)
Basophils Absolute: 0.1 10*3/uL (ref 0.0–0.1)
Basophils Relative: 1 %
Eosinophils Absolute: 0.3 10*3/uL (ref 0.0–0.5)
Eosinophils Relative: 4 %
HCT: 40.6 % (ref 36.0–46.0)
Hemoglobin: 13.4 g/dL (ref 12.0–15.0)
Immature Granulocytes: 0 %
Lymphocytes Relative: 19 %
Lymphs Abs: 1.2 10*3/uL (ref 0.7–4.0)
MCH: 30.2 pg (ref 26.0–34.0)
MCHC: 33 g/dL (ref 30.0–36.0)
MCV: 91.4 fL (ref 80.0–100.0)
Monocytes Absolute: 0.5 10*3/uL (ref 0.1–1.0)
Monocytes Relative: 8 %
Neutro Abs: 4.5 10*3/uL (ref 1.7–7.7)
Neutrophils Relative %: 68 %
Platelet Count: 376 10*3/uL (ref 150–400)
RBC: 4.44 MIL/uL (ref 3.87–5.11)
RDW: 13.7 % (ref 11.5–15.5)
WBC Count: 6.5 10*3/uL (ref 4.0–10.5)
nRBC: 0 % (ref 0.0–0.2)

## 2023-07-25 LAB — HM DIABETES EYE EXAM

## 2023-08-07 ENCOUNTER — Encounter: Payer: Self-pay | Admitting: Internal Medicine

## 2023-09-05 ENCOUNTER — Other Ambulatory Visit: Payer: Self-pay | Admitting: Physician Assistant

## 2023-09-05 DIAGNOSIS — D473 Essential (hemorrhagic) thrombocythemia: Secondary | ICD-10-CM

## 2023-09-12 ENCOUNTER — Telehealth: Payer: Self-pay | Admitting: Licensed Clinical Social Worker

## 2023-09-12 NOTE — Patient Outreach (Signed)
  Care Coordination   09/12/2023 Name: Sara [name] MRN: [id number] DOB: 16-Jan-1956   Care Coordination Outreach Attempts:  An unsuccessful telephone outreach was attempted today to offer the patient information about available care coordination services.  Follow Up Plan:  Additional outreach attempts will be made to offer the patient care coordination information and services.   Encounter Outcome:  No Answer   Care Coordination Interventions:  No, not indicated    Jenel Lucks, MSW, LCSW [facility] Care Management [place]  [facility] [email or web address] Phone [phone] 6:20 PM

## 2023-09-19 ENCOUNTER — Telehealth: Payer: Self-pay | Admitting: *Deleted

## 2023-09-19 NOTE — Progress Notes (Signed)
  Care Coordination Note  09/19/2023 Name: AZILEE TUBBY MRN: [id number] DOB: 04/06/1956  [name] is a 67 y.o. year old female who is a primary care patient of Dorothyann Peng, MD and is actively engaged with the care management team. I reached out to [name] by phone today to assist with re-scheduling an initial visit with the Licensed Clinical Social Worker  Follow up plan: Unsuccessful telephone outreach attempt made.   [facility]  Care Coordination Care Guide  Direct Dial: [phone]

## 2023-09-23 ENCOUNTER — Other Ambulatory Visit: Payer: Self-pay | Admitting: Adult Health

## 2023-09-23 DIAGNOSIS — R569 Unspecified convulsions: Secondary | ICD-10-CM

## 2023-09-25 ENCOUNTER — Other Ambulatory Visit: Payer: Self-pay | Admitting: Anesthesiology

## 2023-09-25 ENCOUNTER — Encounter: Payer: Self-pay | Admitting: Internal Medicine

## 2023-09-25 DIAGNOSIS — R569 Unspecified convulsions: Secondary | ICD-10-CM

## 2023-09-26 ENCOUNTER — Telehealth: Payer: Self-pay

## 2023-09-26 NOTE — Telephone Encounter (Signed)
Faxed vimpat refill to [facility]

## 2023-09-28 NOTE — Progress Notes (Signed)
  Care Coordination Note  09/28/2023 Name: ZEPHORA DYNES MRN: [id number] DOB: 07-04-1956  Sara [name] is a 67 y.o. year old female who is a primary care patient of Dorothyann Peng, MD and is actively engaged with the care management team. I reached out to Sara [name] by phone today to assist with re-scheduling a follow up visit with the RN Case Manager  Follow up plan: Telephone appointment with care management team member scheduled for:10/05/23 with SW and 1/8 with [facility]   [facility] Coordination Care Guide  Direct Dial: [phone]

## 2023-10-02 ENCOUNTER — Ambulatory Visit: Payer: Medicare PPO | Admitting: Adult Health

## 2023-10-02 ENCOUNTER — Encounter: Payer: Self-pay | Admitting: Adult Health

## 2023-10-02 VITALS — BP 160/88 | HR 80 | Ht 61.0 in | Wt 154.0 lb

## 2023-10-02 DIAGNOSIS — R569 Unspecified convulsions: Secondary | ICD-10-CM

## 2023-10-02 NOTE — Progress Notes (Signed)
PATIENT: Sara [name] DOB: 10-22-1956  REASON FOR VISIT: follow up HISTORY FROM: patient  Chief Complaint  Patient presents with   Follow-up    Pt in 19, here alone  Pt is here for follow up on seizures. Pt states she has not had any seizure activity since last time here. States she is not using the dental device any longer. Pt states she is doing well overall.      HISTORY OF PRESENT ILLNESS: Today 10/02/23:  Sara [name] is a 67 y.o. female with a history of seizures and obstructive sleep apnea. Returns today for follow-up.  She reports that she has not had any seizure-like episodes since last seen.  Continues on [name] at 150 mg twice a day.  Reports that she is no longer using the dental device.  Able to complete all ADLs independently.  Operates a Librarian, academic independently.  Returns today for an evaluation.  10/03/22: Sara [name] is a 67 year old female with a history of seizures and obstructive sleep apnea.  She returns today for follow-up.  She denies any additional seizure events.  She remains on [name] 150 mg twice a day.  Operates a motor vehicle without difficulty.  Able to complete all ADLs independently.  She continues to use dental device for sleep apnea but reports that her and Dr. Myrtis Ser are looking at another type of device. Has some anxiety with the previous device because it "locked in."  03/15/22: Sara [name] is a 67 year old female with a history of seizures and obstructive sleep apnea.  She returns today for follow-up.  Seizures: Reports that she has not had any additional events that she knows of since she started [name] 150 mg twice a day.  Tolerating the medication well. Driving a motor vehicle. Able to do everything for herself.   Obstructive sleep apnea: Patient had repeat testing that showed mild sleep apnea.  She now using a dental device. Feels that it helps but not as good as the cpap. Following with Dr. Myrtis Ser.   She returns today for  follow-up  04/29/21: Sara [name] is a 67 year old female with a history of seizures and obstructive sleep apnea.  She returns today for follow-up.  She states that she had another "attack" in December or January.  The attacks always consist of her making sounds during her sleep.  She called her primary care provider.  Dr. Allyne Gee suggested that she take her sugar or have her husband take her sugar when she is making the sounds.  The patient states that typically when she wakes up she feels fine.  She continues to take [name] 100 mg in the morning and 150mg  at bedtime.  She denies missing any medication.  She returns today for an evaluation.  07/22/20:Sara [name] is a 67 year old female with a history of seizures and obstructive sleep apnea.  She returns today for follow-up.  Since her [name] was increased she reports that her husband has not reported any nocturnal events.  Overall she feels that she has been doing well.  Her CPAP download indicates that she use her machine nightly for compliance of 100%.  She used her machine greater than 4 hours each night.  On average she uses her machine 6 hours and 36 minutes.  Her residual AHI is 1.6 on 9 cm of water with EPR of 2.  Leak in the 95th percentile is 42 L/min.  She states that she does not feel the machine leaking at night.  Overall she  feels that she is doing well with the CPAP  HISTORY 03/02/20  Sara [name] is a 67 year old female with history of seizures and obstructive sleep apnea on CPAP.  Her seizures are typically characterized as staring off.  There was question of possible nocturnal seizure last year when she was taking Ozempic, question possible nocturnal seizure described as chewing around 2 AM.  [name] level  was normal, she remained on [name] 100 mg twice daily.  She is overall doing well, has reported 1 or 2 episodes her husband describes as, her making a loud noise, he couldn't wake her, when wearing CPAP, he went back to sleep. She has been  trying to not eat before bed.  Recent A1c was 6.3.  She reports increase in stress.  She says when her husband notices these episodes, it it when she she is really tired, and not well rested.  She really does not want to go up on [name], she requires brand-name.  She presents today for evaluation unaccompanied.   Checked CPAP for equipment function, used for more than 4 hours, 29 out of 30 days, 97%, average usage (days used) 6 hours 44 minutes, set pressure 9 cmH2O, leak in the 95th percentile was 48.5, AHI 2.8.    REVIEW OF SYSTEMS: Out of a complete 14 system review of symptoms, the patient complains only of the following symptoms, and all other reviewed systems are negative.  ESS 9 ALLERGIES: Allergies  Allergen Reactions   Sulfa Antibiotics Itching    HOME MEDICATIONS: Outpatient Medications Prior to Visit  Medication Sig Dispense Refill   amLODipine (NORVASC) 2.5 MG tablet Take 1 tablet (2.5 mg total) by mouth daily. 90 tablet 2   anagrelide ([name]) 1 MG capsule TAKE 1 CAPSULE(1 MG) BY MOUTH DAILY 30 capsule 5   aspirin EC 81 MG tablet Take 81 mg by mouth daily. Swallow whole.     atorvastatin (LIPITOR) 40 MG tablet Take 1 tablet (40 mg total) by mouth daily. 90 tablet 3   Cholecalciferol (VITAMIN D3) 2000 UNITS capsule Take 2,000 Units by mouth daily.      Lacosamide 150 MG TABS TAKE 1 TABLET(150 MG) BY MOUTH TWICE DAILY 60 tablet 1   Magnesium 250 MG TABS Take 250 mg by mouth daily.     metoprolol tartrate (LOPRESSOR) 100 MG tablet Take 1 tablet (100 mg total) by mouth 2 (two) times daily. 180 tablet 3   Prenatal Vit-Fe Fumarate-FA (PRENATAL MULTIVITAMIN/IRON PO) Take by mouth.     telmisartan (MICARDIS) 80 MG tablet Take 1 tablet (80 mg total) by mouth daily. 90 tablet 2   Multiple Vitamin (MULTIVITAMIN) capsule Take 1 capsule by mouth daily.     hydrOXYzine (ATARAX) 10 MG tablet Take 10 mg by mouth 3 (three) times daily as needed. (Patient not taking: Reported on 10/02/2023)      blood glucose meter kit and supplies KIT Dispense based on patient and insurance preference. Use up to four times daily as directed. 1 each 0   hydrOXYzine (ATARAX) 10 MG tablet Take 10 mg by mouth daily as needed.     No facility-administered medications prior to visit.    PAST MEDICAL HISTORY: Past Medical History:  Diagnosis Date   Anxiety    Blood dyscrasia    Chronic kidney disease    Gout    Hyperlipemia    Hypertension    LVH (left ventricular hypertrophy)    12/21/09 Echo -mild asymmetric LVH,EF =>55%   Morbid obesity (HCC)  Myocardial infarction (HCC)    Nausea vomiting and diarrhea    OSA (obstructive sleep apnea)    PAD (peripheral artery disease) (HCC)    Pneumonia    Pre-diabetes    Renal artery stenosis (HCC) 12/13/2010   PTCA and Stent - right   Seizures (HCC)    Syncopal episodes    Thrombocytosis    Type 2 diabetes mellitus (HCC) 07/20/2013    PAST SURGICAL HISTORY: Past Surgical History:  Procedure Laterality Date   BREAST CYST EXCISION     S/P Benign Right Breast Cyst Removal.   BREAST REDUCTION SURGERY  1995   S/P Bilateral breast reduction   CESAREAN SECTION  1980, 1986   Times  Two.   CORONARY ARTERY BYPASS GRAFT N/A 04/21/2017   Procedure: CORONARY ARTERY BYPASS GRAFTING (CABG) x 2, USING LEFT MAMMARY ARTERY AND RIGHT GREATER SAPHENOUS VEIN HARVESTED ENDOSCOPICALLY;  Surgeon: Alleen Borne, MD;  Location: [facility] OR;  Service: Open Heart Surgery;  Laterality: N/A;   EXTREMITY CYST EXCISION  1993   left wrist   LEFT HEART CATH AND CORONARY ANGIOGRAPHY N/A 04/17/2017   Procedure: Left Heart Cath and Coronary Angiography;  Surgeon: Corky Crafts, MD;  Location: [facility] INVASIVE CV LAB;  Service: Cardiovascular;  Laterality: N/A;   PARTIAL HYSTERECTOMY  1994   renal artery stent placement Right    TEE WITHOUT CARDIOVERSION N/A 04/21/2017   Procedure: TRANSESOPHAGEAL ECHOCARDIOGRAM (TEE);  Surgeon: Alleen Borne, MD;  Location: [facility] OR;  Service:  Open Heart Surgery;  Laterality: N/A;   VENTRAL HERNIA REPAIR N/A 06/17/2021   Procedure: OPEN VENTRAL HERNIA REPAIR WITH MESH;  Surgeon: Kinsinger, De Blanch, MD;  Location: [facility] ORS;  Service: General;  Laterality: N/A;    FAMILY HISTORY: Family History  Problem Relation Age of Onset   Heart failure Mother    Diabetes Father    Diabetes Sister    Hypertension Sister    Hypertension Sister    Hypertension Sister    Breast cancer Maternal Aunt        2 maternal aunts had breast cancer.   Breast cancer Maternal Grandmother    Sleep apnea Neg Hx     SOCIAL HISTORY: Social History   Socioeconomic History   Marital status: Married    Spouse name: Leonette Most   Number of children: 2   Years of education: 16   Highest education level: Not on file  Occupational History   Occupation: TRANSPORTATION COOR.    Employer: [employer]  Tobacco Use   Smoking status: Never   Smokeless tobacco: Never  Vaping Use   Vaping status: Never Used  Substance and Sexual Activity   Alcohol use: No    Alcohol/week: 0.0 standard drinks of alcohol   Drug use: No   Sexual activity: Yes  Other Topics Concern   Not on file  Social History Narrative   Patient lives at husband and her son, home with family.   Patient has two adult children.   Patient is not drinking any caffeine.   Patient is working full-time.   Patient has a college education.   Patient is right-handed.   Social Determinants of Health   Financial Resource Strain: Low Risk  (11/17/2022)   Overall Financial Resource Strain (CARDIA)    Difficulty of Paying Living Expenses: Not hard at all  Food Insecurity: No Food Insecurity (11/17/2022)   Hunger Vital Sign    Worried About Running Out of Food in the Last Year: Never true  Ran Out of Food in the Last Year: Never true  Transportation Needs: No Transportation Needs (11/17/2022)   PRAPARE - Administrator, Civil Service (Medical): No    Lack of Transportation  (Non-Medical): No  Physical Activity: Inactive (11/17/2022)   Exercise Vital Sign    Days of Exercise per Week: 0 days    Minutes of Exercise per Session: 0 min  Stress: Stress Concern Present (11/17/2022)   [employer] of Occupational Health - Occupational Stress Questionnaire    Feeling of Stress : To some extent  Social Connections: Socially Integrated (11/04/2020)   Social Connection and Isolation Panel [NHANES]    Frequency of Communication with Friends and Family: More than three times a week    Frequency of Social Gatherings with Friends and Family: Not on file    Attends Religious Services: More than 4 times per year    Active Member of [employer] or Organizations: Yes    Attends Banker Meetings: More than 4 times per year    Marital Status: Married  Catering manager Violence: Not At Risk (11/04/2020)   Humiliation, Afraid, Rape, and Kick questionnaire    Fear of Current or Ex-Partner: No    Emotionally Abused: No    Physically Abused: No    Sexually Abused: No      PHYSICAL EXAM  Vitals:   10/02/23 0816  BP: (!) 160/88  Pulse: 80  Weight: 154 lb (69.9 kg)  Height: 5\' 1"  (1.549 m)   Body mass index is 29.1 kg/m.  Generalized: Well developed, in no acute distress   Neurological examination  Mentation: Alert oriented to time, place, history taking. Follows all commands speech and language fluent Cranial nerve II-XII: Pupils were equal round reactive to light. Extraocular movements were full, visual field were full on confrontational test.. Head turning and shoulder shrug  were normal and symmetric. Motor: The motor testing reveals 5 over 5 strength of all 4 extremities. Good symmetric motor tone is noted throughout.  Sensory: Sensory testing is intact to soft touch on all 4 extremities. No evidence of extinction is noted.  Coordination: Cerebellar testing reveals good finger-nose-finger and heel-to-shin bilaterally.  Gait and station: Gait is normal.    Reflexes: Deep tendon reflexes are symmetric and normal bilaterally.   DIAGNOSTIC DATA (LABS, IMAGING, TESTING) - I reviewed patient records, labs, notes, testing and imaging myself where available.  Lab Results  Component Value Date   WBC 6.5 07/25/2023   HGB 13.4 07/25/2023   HCT 40.6 07/25/2023   MCV 91.4 07/25/2023   PLT 376 07/25/2023      Component Value Date/Time   NA 137 06/12/2023 0941   NA 139 08/11/2017 1528   K 4.6 06/12/2023 0941   K 4.2 08/11/2017 1528   CL 100 06/12/2023 0941   CL 102 03/19/2013 1535   CO2 26 06/12/2023 0941   CO2 27 08/11/2017 1528   GLUCOSE 62 (L) 06/12/2023 0941   GLUCOSE 96 03/04/2022 1447   GLUCOSE 93 08/11/2017 1528   GLUCOSE 117 (H) 03/19/2013 1535   BUN 20 06/12/2023 0941   BUN 15.0 08/11/2017 1528   CREATININE 1.17 (H) 06/12/2023 0941   CREATININE 1.26 (H) 03/04/2022 1447   CREATININE 1.1 08/11/2017 1528   CALCIUM 10.1 06/12/2023 0941   CALCIUM 9.2 08/11/2017 1528   PROT 6.3 06/12/2023 0941   PROT 7.0 08/11/2017 1528   ALBUMIN 3.7 (L) 06/12/2023 0941   ALBUMIN 3.6 08/11/2017 1528   AST 22 06/12/2023  0941   AST 20 03/04/2022 1447   AST 27 08/11/2017 1528   ALT 29 06/12/2023 0941   ALT 29 03/04/2022 1447   ALT 26 08/11/2017 1528   ALKPHOS 94 06/12/2023 0941   ALKPHOS 88 08/11/2017 1528   BILITOT 0.4 06/12/2023 0941   BILITOT 0.5 03/04/2022 1447   BILITOT 0.37 08/11/2017 1528   GFRNONAA 47 (L) 03/04/2022 1447   GFRAA 49 (L) 11/16/2020 1209   Lab Results  Component Value Date   CHOL 108 06/12/2023   HDL 55 06/12/2023   LDLCALC 44 06/12/2023   TRIG 30 06/12/2023   CHOLHDL 2.0 06/12/2023   Lab Results  Component Value Date   HGBA1C 6.2 (H) 06/12/2023   Lab Results  Component Value Date   VITAMINB12 924 03/13/2019   Lab Results  Component Value Date   TSH 2.390 06/12/2023      ASSESSMENT AND PLAN 67 y.o. year old female  has a past medical history of Anxiety, Blood dyscrasia, Chronic kidney disease, Gout,  Hyperlipemia, Hypertension, LVH (left ventricular hypertrophy), Morbid obesity (HCC), Myocardial infarction (HCC), Nausea vomiting and diarrhea, OSA (obstructive sleep apnea), PAD (peripheral artery disease) (HCC), Pneumonia, Pre-diabetes, Renal artery stenosis (HCC) (12/13/2010), Seizures (HCC), Syncopal episodes, Thrombocytosis, and Type 2 diabetes mellitus (HCC) (07/20/2013). here with:   1.  Seizures   Continue [name] 150 mg twice a day Advised if she has any seizure event she should let us know Follow-up in 1 year or sooner if needed    Butch Penny, MSN, NP-C 10/02/2023, 8:35 AM [facility] [address], [address] [place], [place] [address] [phone]

## 2023-10-02 NOTE — Patient Instructions (Signed)
Your Plan:  Continue lacosamide 150 mg twice a day      Thank you for coming to see [place] at [facility]. I hope we have been able to provide you high quality care today.  You may receive a patient satisfaction survey over the next few weeks. We would appreciate your feedback and comments so that we may continue to improve ourselves and the health of our patients.

## 2023-10-04 ENCOUNTER — Encounter: Payer: Self-pay | Admitting: Cardiovascular Disease

## 2023-10-04 ENCOUNTER — Ambulatory Visit: Payer: Medicare PPO | Attending: Cardiovascular Disease | Admitting: Cardiovascular Disease

## 2023-10-04 VITALS — BP 130/80 | HR 75 | Ht 61.0 in | Wt 157.4 lb

## 2023-10-04 DIAGNOSIS — I10 Essential (primary) hypertension: Secondary | ICD-10-CM

## 2023-10-04 DIAGNOSIS — E1122 Type 2 diabetes mellitus with diabetic chronic kidney disease: Secondary | ICD-10-CM | POA: Diagnosis not present

## 2023-10-04 DIAGNOSIS — E663 Overweight: Secondary | ICD-10-CM | POA: Diagnosis not present

## 2023-10-04 DIAGNOSIS — Z8679 Personal history of other diseases of the circulatory system: Secondary | ICD-10-CM

## 2023-10-04 DIAGNOSIS — N1831 Chronic kidney disease, stage 3a: Secondary | ICD-10-CM | POA: Diagnosis not present

## 2023-10-04 DIAGNOSIS — D473 Essential (hemorrhagic) thrombocythemia: Secondary | ICD-10-CM

## 2023-10-04 DIAGNOSIS — N183 Chronic kidney disease, stage 3 unspecified: Secondary | ICD-10-CM

## 2023-10-04 DIAGNOSIS — G4733 Obstructive sleep apnea (adult) (pediatric): Secondary | ICD-10-CM

## 2023-10-04 DIAGNOSIS — I2510 Atherosclerotic heart disease of native coronary artery without angina pectoris: Secondary | ICD-10-CM

## 2023-10-04 DIAGNOSIS — I5032 Chronic diastolic (congestive) heart failure: Secondary | ICD-10-CM | POA: Diagnosis not present

## 2023-10-04 NOTE — Patient Instructions (Signed)

## 2023-10-04 NOTE — Progress Notes (Signed)
Cardiology Office Note   Date:  10/08/2023   ID:  Sara [name], DOB 16-Aug-1956, MRN [id number] PCP:  Dorothyann Peng, MD  Cardiologist:  Rasheem Figiel Electrophysiologist:  None   Evaluation Performed:  Follow-Up Visit  Chief Complaint:  CAD  History of Present Illness:    Sara [name] is a 67 y.o. female with history of artery disease (NSTEMI April 15, 2017, LIMA to LAD and SVG to diagonal bypass surgery on April 21, 2017).  Mild ischemic cardiomyopathy (EF 45-50%), peripheral arterial disease (renal artery stenosis with stent 2012), history of morbid obesity, diabetes mellitus, hyperlipidemia, hypertension, obstructive sleep apnea, essential thrombocythemia and absence seizures, returning for routine follow-up.  She is doing well.  She's lost about another 10 pounds since last year and has lost almost 70 pounds from her peak weight of 226 pounds about 6 years ago.  She is no longer obese, her BMI is down to 29 .7.  She was intolerant of CPAP and oral appliances, but now she no longer has any symptoms of sleep apnea.  She gets 7 hours of mostly uninterrupted sleep (sometimes she has to get up once to use the bathroom), does not have daytime hypersomnolence.  Metabolic parameters are also good with an LDL cholesterol 44, HDL of 55 and a hemoglobin A1c in prediabetes range at 6.2%, without any medications.  Despite the weight loss, her blood pressure still remains relatively high today at 140/84.  The patient specifically denies any chest pain at rest or with exertion, dyspnea at rest or with exertion, orthopnea, paroxysmal nocturnal dyspnea, syncope, palpitations, focal neurological deficits, intermittent claudication, lower extremity edema, unexplained weight gain, cough, hemoptysis or wheezing.   This year she has the additional stress and worrying about her daughter, who may have a lump related to breast problems.  She remains under a lot of emotional stress at home due to the  unpredictable behavior and mood swings of her 47 year old son who has paranoid schizophrenia.  He has not been physically violent but is often verbally abusive and becomes easily agitated.  She was hospitalized 04/01 - 01/28/2021 with lethargy, nausea, vomiting and diarrhea and transient acute kidney injury.  She had a small bowel obstruction that resolved with conservative management.  She has had episodes of loss of consciousness that are consistent with absence seizures, currently treated with lacosamide.  She had normal vital signs when she was unresponsive.   She is very busy taking care of her sister who had this a disabling stroke, her son who has paranoid schizophrenia and her husband who has lumbar spine disease and limited mobility.  She has retired from [employer] .  Past Medical History:  Diagnosis Date   Anxiety    Blood dyscrasia    Chronic kidney disease    Gout    Hyperlipemia    Hypertension    LVH (left ventricular hypertrophy)    12/21/09 Echo -mild asymmetric LVH,EF =>55%   Morbid obesity (HCC)    Myocardial infarction (HCC)    Nausea vomiting and diarrhea    OSA (obstructive sleep apnea)    PAD (peripheral artery disease) (HCC)    Pneumonia    Pre-diabetes    Renal artery stenosis (HCC) 12/13/2010   PTCA and Stent - right   Seizures (HCC)    Syncopal episodes    Thrombocytosis    Type 2 diabetes mellitus (HCC) 07/20/2013   Past Surgical History:  Procedure Laterality Date   BREAST CYST EXCISION  S/P Benign Right Breast Cyst Removal.   BREAST REDUCTION SURGERY  1995   S/P Bilateral breast reduction   CESAREAN SECTION  1980, 1986   Times  Two.   CORONARY ARTERY BYPASS GRAFT N/A 04/21/2017   Procedure: CORONARY ARTERY BYPASS GRAFTING (CABG) x 2, USING LEFT MAMMARY ARTERY AND RIGHT GREATER SAPHENOUS VEIN HARVESTED ENDOSCOPICALLY;  Surgeon: Alleen Borne, MD;  Location: [facility] OR;  Service: Open Heart Surgery;  Laterality: N/A;   EXTREMITY CYST  EXCISION  1993   left wrist   LEFT HEART CATH AND CORONARY ANGIOGRAPHY N/A 04/17/2017   Procedure: Left Heart Cath and Coronary Angiography;  Surgeon: Corky Crafts, MD;  Location: [facility] INVASIVE CV LAB;  Service: Cardiovascular;  Laterality: N/A;   PARTIAL HYSTERECTOMY  1994   renal artery stent placement Right    TEE WITHOUT CARDIOVERSION N/A 04/21/2017   Procedure: TRANSESOPHAGEAL ECHOCARDIOGRAM (TEE);  Surgeon: Alleen Borne, MD;  Location: [facility] OR;  Service: Open Heart Surgery;  Laterality: N/A;   VENTRAL HERNIA REPAIR N/A 06/17/2021   Procedure: OPEN VENTRAL HERNIA REPAIR WITH MESH;  Surgeon: Kinsinger, De Blanch, MD;  Location: [facility] ORS;  Service: General;  Laterality: N/A;     Current Meds  Medication Sig   amLODipine (NORVASC) 2.5 MG tablet Take 1 tablet (2.5 mg total) by mouth daily.   anagrelide ([name]) 1 MG capsule TAKE 1 CAPSULE(1 MG) BY MOUTH DAILY   aspirin EC 81 MG tablet Take 81 mg by mouth daily. Swallow whole.   atorvastatin (LIPITOR) 40 MG tablet Take 1 tablet (40 mg total) by mouth daily.   Cholecalciferol (VITAMIN D3) 2000 UNITS capsule Take 2,000 Units by mouth daily.    Lacosamide 150 MG TABS TAKE 1 TABLET(150 MG) BY MOUTH TWICE DAILY   Magnesium 250 MG TABS Take 250 mg by mouth daily.   metoprolol tartrate (LOPRESSOR) 100 MG tablet Take 1 tablet (100 mg total) by mouth 2 (two) times daily.   Prenatal Vit-Fe Fumarate-FA (PRENATAL MULTIVITAMIN/IRON PO) Take by mouth.   telmisartan (MICARDIS) 80 MG tablet Take 1 tablet (80 mg total) by mouth daily.     Allergies:   Sulfa antibiotics   Social History   Tobacco Use   Smoking status: Never   Smokeless tobacco: Never  Vaping Use   Vaping status: Never Used  Substance Use Topics   Alcohol use: No    Alcohol/week: 0.0 standard drinks of alcohol   Drug use: No     Family Hx: The patient's family history includes Breast cancer in her maternal aunt and maternal grandmother; Diabetes in her father and sister;  Heart failure in her mother; Hypertension in her sister, sister, and sister. There is no history of Sleep apnea.  ROS:   Please see the history of present illness.     All other systems reviewed and are negative.   Prior CV studies:   The following studies were reviewed today:  Labs/Other Tests and Data Reviewed:    EKG: ECG from 06/12/2023 is personally reviewed.  It shows borderline sinus bradycardia 59 bpm and mild first-degree AV block, chronic QS pattern in leads V1-V2.  There are no acute repolarization abnormalities.  Normal QTc.   EKG Interpretation Date/Time:    Ventricular Rate:    PR Interval:    QRS Duration:    QT Interval:    QTC Calculation:   R Axis:      Text Interpretation:          Recent Labs: 06/12/2023:  ALT 29; BUN 20; Creatinine, Ser 1.17; Potassium 4.6; Sodium 137; TSH 2.390 07/25/2023: Hemoglobin 13.4; Platelet Count 376   Recent Lipid Panel Lab Results  Component Value Date/Time   CHOL 108 06/12/2023 09:41 AM   TRIG 30 06/12/2023 09:41 AM   HDL 55 06/12/2023 09:41 AM   CHOLHDL 2.0 06/12/2023 09:41 AM   CHOLHDL 2.7 04/17/2017 10:00 AM   LDLCALC 44 06/12/2023 09:41 AM    Wt Readings from Last 3 Encounters:  10/04/23 157 lb 6.4 oz (71.4 kg)  10/02/23 154 lb (69.9 kg)  06/12/23 158 lb 3.2 oz (71.8 kg)     Objective:    Vital Signs:  BP 130/80   Pulse 75   Ht 5\' 1"  (1.549 m)   Wt 157 lb 6.4 oz (71.4 kg)   SpO2 96%   BMI 29.74 kg/m     General: Alert, oriented x3, no distress, borderline obese Head: no evidence of trauma, PERRL, EOMI, no exophtalmos or lid lag, no myxedema, no xanthelasma; normal ears, nose and oropharynx Neck: normal jugular venous pulsations and no hepatojugular reflux; brisk carotid pulses without delay and no carotid bruits Chest: clear to auscultation, no signs of consolidation by percussion or palpation, normal fremitus, symmetrical and full respiratory excursions Cardiovascular: normal position and quality of  the apical impulse, regular rhythm, normal first and second heart sounds, no murmurs, rubs or gallops Abdomen: no tenderness or distention, no masses by palpation, no abnormal pulsatility or arterial bruits, normal bowel sounds, no hepatosplenomegaly Extremities: no clubbing, cyanosis or edema; 2+ radial, ulnar and brachial pulses bilaterally; 2+ right femoral, posterior tibial and dorsalis pedis pulses; 2+ left femoral, posterior tibial and dorsalis pedis pulses; no subclavian or femoral bruits Neurological: grossly nonfocal Psych: Normal mood and affect    ASSESSMENT & PLAN:    1. Coronary artery disease involving native coronary artery of native heart without angina pectoris   2. Chronic diastolic congestive heart failure (HCC)   3. Essential hypertension   4. OSA (obstructive sleep apnea)   5. Controlled type 2 diabetes mellitus with stage 3 chronic kidney disease, without long-term current use of insulin (HCC)   6. Overweight   7. History of renal artery stenosis   8. Stage 3a chronic kidney disease (HCC)   9. Essential thrombocythemia (HCC)        CAD s/p CABG: Despite being physically active she does not have any angina pectoris.   On aspirin, statin, beta-blocker. CHF: She has not had any manifestations of heart failure since her bypass surgery. NYHA functional class I and clinically euvolemic without the use of loop diuretics.   HTN: Well-controlled OSA: Denies daytime hypersomnolence.   DM: A1c 6.2% without medications following weight loss. HLP: All lipid parameters are in target range.  Continue statin. Overweight: She is denying exceptional job losing weight.  Her BMI is now less than 30. Hx of renal artery stent: Creatinine has actually improved, now down to 1.17.  Blood pressure is well-controlled. CKD 3a: Baseline creatinine seems to be around 1.3 (GFR 50).  Essential thrombocythemia:   Most recent platelet count 376K.   Medication Adjustments/Labs and Tests  Ordered: Current medicines are reviewed at length with the patient today.  Concerns regarding medicines are outlined above.   Tests Ordered: No orders of the defined types were placed in this encounter.    Medication Changes: No orders of the defined types were placed in this encounter.   Patient Instructions  Medication Instructions:  No changes *If you  need a refill on your cardiac medications before your next appointment, please call your pharmacy*  Follow-Up: At [facility] you and your health needs are our priority.  As part of our continuing mission to provide you with exceptional heart care, we have created designated Provider Care Teams.  These Care Teams include your primary Cardiologist (physician) and Advanced Practice Providers (APPs -  Physician Assistants and Nurse Practitioners) who all work together to provide you with the care you need, when you need it.  We recommend signing up for the patient portal called "MyChart".  Sign up information is provided on this After Visit Summary.  MyChart is used to connect with patients for Virtual Visits (Telemedicine).  Patients are able to view lab/test results, encounter notes, upcoming appointments, etc.  Non-urgent messages can be sent to your provider as well.   To learn more about what you can do with MyChart, go to [email or web address].    Your next appointment:   1 year(s)  Provider:   Thurmon Fair, MD       Follow Up:  Virtual Visit or In Person  1 year  Signed, Thurmon Fair, MD  10/08/2023 1:43 PM    [facility]

## 2023-10-05 ENCOUNTER — Ambulatory Visit: Payer: Self-pay | Admitting: Licensed Clinical Social Worker

## 2023-10-06 NOTE — Patient Outreach (Signed)
  Care Coordination   Follow Up Visit Note   10/05/2023 Name: Sara [name] MRN: [id number] DOB: 07/03/1956  Sara [name] is a 67 y.o. year old female who sees Dorothyann Peng, MD for primary care. I spoke with  Sara [name] by phone today.  What matters to the patients health and wellness today?  Symptom Management and Supportive Resources    Goals Addressed             This Visit's Progress    LCSW-Strengthen Support   On track    Activities and task to complete in order to accomplish goals.   Keep all upcoming appointments discussed today Continue with compliance of taking medication prescribed by Doctor Implement healthy coping skills discussed to assist with management of symptoms Follow up with community agencies regarding goals for son Solution-Focused Strategies employed:  Active listening / Reflection utilized  Emotional Support Provided Patient identified stressors that negatively impact mental health Validation and encouragement provided Patient is motivated to continue with management of health conditions Healthy coping skills discussed         SDOH assessments and interventions completed:  Yes  SDOH Interventions Today    Flowsheet Row Most Recent Value  SDOH Interventions   Food Insecurity Interventions Intervention Not Indicated  Housing Interventions Intervention Not Indicated  Transportation Interventions Intervention Not Indicated        Care Coordination Interventions:  Yes, provided  Interventions Today    Flowsheet Row Most Recent Value  Chronic Disease   Chronic disease during today's visit Hypertension (HTN), Congestive Heart Failure (CHF), Chronic Kidney Disease/End Stage Renal Disease (ESRD), Diabetes  General Interventions   General Interventions Discussed/Reviewed General Interventions Reviewed, Doctor Visits, Community Resources  Doctor Visits Discussed/Reviewed Doctor Visits Reviewed  Exercise Interventions   Exercise  Discussed/Reviewed Exercise Reviewed  Mental Health Interventions   Mental Health Discussed/Reviewed Mental Health Reviewed, Coping Strategies, Depression, Anxiety  [Stress management]  Nutrition Interventions   Nutrition Discussed/Reviewed Nutrition Reviewed  Pharmacy Interventions   Pharmacy Dicussed/Reviewed Pharmacy Topics Reviewed, Medication Adherence  Safety Interventions   Safety Discussed/Reviewed Safety Reviewed       Follow up plan: Follow up call scheduled for 2-4 weeks    Encounter Outcome:  Patient Visit Completed   Jenel Lucks, MSW, LCSW [facility] Care Management [facility]  [email or web address] Phone [phone] 4:44 PM

## 2023-10-06 NOTE — Patient Instructions (Signed)
Visit Information  Thank you for taking time to visit with me today. Please don't hesitate to contact me if I can be of assistance to you.   Following are the goals we discussed today:   Goals Addressed             This Visit's Progress    LCSW-Strengthen Support   On track    Activities and task to complete in order to accomplish goals.   Keep all upcoming appointments discussed today Continue with compliance of taking medication prescribed by Doctor Implement healthy coping skills discussed to assist with management of symptoms Follow up with community agencies regarding goals for son Solution-Focused Strategies employed:  Active listening / Reflection utilized  Emotional Support Provided Patient identified stressors that negatively impact mental health Validation and encouragement provided Patient is motivated to continue with management of health conditions Healthy coping skills discussed         Our next appointment is by telephone on 01/09 at 10 AM  Please call the care guide team at [phone] if you need to cancel or reschedule your appointment.   If you are experiencing a Mental Health or Behavioral Health Crisis or need someone to talk to, please call the Suicide and Crisis Lifeline: 988 call 911   Patient verbalizes understanding of instructions and care plan provided today and agrees to view in MyChart. Active MyChart status and patient understanding of how to access instructions and care plan via MyChart confirmed with patient.     Jenel Lucks, MSW, LCSW [facility] Care Management [place]  [facility] [email or web address] Phone [phone] 4:44 PM

## 2023-10-11 NOTE — Telephone Encounter (Signed)
Abstraction completed by the office. Waiting for notes to be scanned to the chart.

## 2023-10-16 ENCOUNTER — Ambulatory Visit (INDEPENDENT_AMBULATORY_CARE_PROVIDER_SITE_OTHER): Payer: Medicare PPO | Admitting: Internal Medicine

## 2023-10-16 ENCOUNTER — Encounter: Payer: Self-pay | Admitting: Internal Medicine

## 2023-10-16 VITALS — BP 138/80 | HR 67 | Temp 98.7°F | Ht 60.8 in | Wt 153.0 lb

## 2023-10-16 DIAGNOSIS — E663 Overweight: Secondary | ICD-10-CM | POA: Diagnosis not present

## 2023-10-16 DIAGNOSIS — E1122 Type 2 diabetes mellitus with diabetic chronic kidney disease: Secondary | ICD-10-CM | POA: Diagnosis not present

## 2023-10-16 DIAGNOSIS — I509 Heart failure, unspecified: Secondary | ICD-10-CM

## 2023-10-16 DIAGNOSIS — Z6829 Body mass index (BMI) 29.0-29.9, adult: Secondary | ICD-10-CM | POA: Diagnosis not present

## 2023-10-16 DIAGNOSIS — E7800 Pure hypercholesterolemia, unspecified: Secondary | ICD-10-CM

## 2023-10-16 DIAGNOSIS — N1831 Chronic kidney disease, stage 3a: Secondary | ICD-10-CM

## 2023-10-16 DIAGNOSIS — I130 Hypertensive heart and chronic kidney disease with heart failure and stage 1 through stage 4 chronic kidney disease, or unspecified chronic kidney disease: Secondary | ICD-10-CM

## 2023-10-16 NOTE — Progress Notes (Unsigned)
I,Tianna Badgett,acting as a Neurosurgeon for [name], MD.,have documented all relevant documentation on the behalf of [name], MD,as directed by  [name], MD while in the presence of [name], MD.  Subjective:  Patient ID: Sara [name] , female    DOB: [birth year] , 67 y.o.   MRN: [id number]  Chief Complaint  Patient presents with   Diabetes   Hypertension    HPI  Patient presents today for DM follow up. She states that she is having a rough day so far.   Diabetes     Past Medical History:  Diagnosis Date   Anxiety    Blood dyscrasia    Chronic kidney disease    Gout    Hyperlipemia    Hypertension    LVH (left ventricular hypertrophy)    12/21/09 Echo -mild asymmetric LVH,EF =>55%   Morbid obesity (HCC)    Myocardial infarction (HCC)    Nausea vomiting and diarrhea    OSA (obstructive sleep apnea)    PAD (peripheral artery disease) (HCC)    Pneumonia    Pre-diabetes    Renal artery stenosis (HCC) 12/13/2010   PTCA and Stent - right   Seizures (HCC)    Syncopal episodes    Thrombocytosis    Type 2 diabetes mellitus (HCC) 07/20/2013     Family History  Problem Relation Age of Onset   Heart failure Mother    Diabetes Father    Diabetes Sister    Hypertension Sister    Hypertension Sister    Hypertension Sister    Breast cancer Maternal Aunt        2 maternal aunts had breast cancer.   Breast cancer Maternal Grandmother    Sleep apnea Neg Hx      Current Outpatient Medications:    amLODipine (NORVASC) 2.5 MG tablet, Take 1 tablet (2.5 mg total) by mouth daily., Disp: 90 tablet, Rfl: 2   anagrelide ([name]) 1 MG capsule, TAKE 1 CAPSULE(1 MG) BY MOUTH DAILY, Disp: 30 capsule, Rfl: 5   aspirin EC 81 MG tablet, Take 81 mg by mouth daily. Swallow whole., Disp: , Rfl:    atorvastatin (LIPITOR) 40 MG tablet, Take 1 tablet (40 mg total) by mouth daily., Disp: 90 tablet, Rfl: 3   Cholecalciferol (VITAMIN D3) 2000 UNITS capsule, Take  2,000 Units by mouth daily. , Disp: , Rfl:    hydrOXYzine (ATARAX) 10 MG tablet, Take 10 mg by mouth 3 (three) times daily as needed., Disp: , Rfl:    Lacosamide 150 MG TABS, TAKE 1 TABLET(150 MG) BY MOUTH TWICE DAILY, Disp: 60 tablet, Rfl: 1   Magnesium 250 MG TABS, Take 250 mg by mouth daily., Disp: , Rfl:    metoprolol tartrate (LOPRESSOR) 100 MG tablet, Take 1 tablet (100 mg total) by mouth 2 (two) times daily., Disp: 180 tablet, Rfl: 3   Prenatal Vit-Fe Fumarate-FA (PRENATAL MULTIVITAMIN/IRON PO), Take by mouth., Disp: , Rfl:    telmisartan (MICARDIS) 80 MG tablet, Take 1 tablet (80 mg total) by mouth daily., Disp: 90 tablet, Rfl: 2   Allergies  Allergen Reactions   Sulfa Antibiotics Itching     Review of Systems  Constitutional: Negative.   Respiratory: Negative.    Cardiovascular: Negative.   Gastrointestinal: Negative.   Neurological: Negative.      Today's Vitals   10/16/23 1530 10/16/23 1557  BP: (!) 160/88 138/80  Pulse: 67   Temp: 98.7 F (37.1 C)   TempSrc:  Oral   SpO2: 98%   Weight: 153 lb (69.4 kg)   Height: 5' 0.8" (1.544 m)    Body mass index is 29.1 kg/m.  Wt Readings from Last 3 Encounters:  10/16/23 153 lb (69.4 kg)  10/04/23 157 lb 6.4 oz (71.4 kg)  10/02/23 154 lb (69.9 kg)    The ASCVD Risk score (Arnett DK, et al., 2019) failed to calculate for the following reasons:   Risk score cannot be calculated because patient has a medical history suggesting prior/existing ASCVD  Objective:  Physical Exam Vitals and nursing note reviewed.  Constitutional:      Appearance: Normal appearance.  HENT:     Head: Normocephalic and atraumatic.  Eyes:     Extraocular Movements: Extraocular movements intact.  Cardiovascular:     Rate and Rhythm: Normal rate and regular rhythm.     Heart sounds: Normal heart sounds.  Pulmonary:     Effort: Pulmonary effort is normal.     Breath sounds: Normal breath sounds.  Musculoskeletal:     Cervical back: Normal  range of motion.  Skin:    General: Skin is warm.  Neurological:     General: No focal deficit present.     Mental Status: She is alert.  Psychiatric:        Mood and Affect: Mood normal.        Behavior: Behavior normal.         Assessment And Plan:  Type 2 diabetes mellitus with stage 3a chronic kidney disease, without long-term current use of insulin (HCC) -     Hemoglobin A1c -     BMP8+EGFR  Hypertensive heart and renal disease with renal failure, stage 1 through stage 4 or unspecified chronic kidney disease, with heart failure (HCC) -     BMP8+EGFR  Pure hypercholesterolemia  Overweight with body mass index (BMI) of 29 to 29.9 in adult    Return in about 4 months (around 02/14/2024) for diabetes check.  Patient was given opportunity to ask questions. Patient verbalized understanding of the plan and was able to repeat key elements of the plan. All questions were answered to their satisfaction.    I, [name], MD, have reviewed all documentation for this visit. The documentation on 10/16/23 for the exam, diagnosis, procedures, and orders are all accurate and complete.   IF YOU HAVE BEEN REFERRED TO A SPECIALIST, IT MAY TAKE 1-2 WEEKS TO SCHEDULE/PROCESS THE REFERRAL. IF YOU HAVE NOT HEARD FROM US/SPECIALIST IN TWO WEEKS, PLEASE GIVE [place] A CALL AT [phone].

## 2023-10-16 NOTE — Patient Instructions (Addendum)
[facility] [place]  Diabetes Mellitus and Nutrition, Adult When you have diabetes, or diabetes mellitus, it is very important to have healthy eating habits because your blood sugar (glucose) levels are greatly affected by what you eat and drink. Eating healthy foods in the right amounts, at about the same times every day, can help you: Manage your blood glucose. Lower your risk of heart disease. Improve your blood pressure. Reach or maintain a healthy weight. What can affect my meal plan? Every person with diabetes is different, and each person has different needs for a meal plan. Your health care provider may recommend that you work with a dietitian to make a meal plan that is best for you. Your meal plan may vary depending on factors such as: The calories you need. The medicines you take. Your weight. Your blood glucose, blood pressure, and cholesterol levels. Your activity level. Other health conditions you have, such as heart or kidney disease. How do carbohydrates affect me? Carbohydrates, also called carbs, affect your blood glucose level more than any other type of food. Eating carbs raises the amount of glucose in your blood. It is important to know how many carbs you can safely have in each meal. This is different for every person. Your dietitian can help you calculate how many carbs you should have at each meal and for each snack. How does alcohol affect me? Alcohol can cause a decrease in blood glucose (hypoglycemia), especially if you use insulin or take certain diabetes medicines by mouth. Hypoglycemia can be a life-threatening condition. Symptoms of hypoglycemia, such as sleepiness, dizziness, and confusion, are similar to symptoms of having too much alcohol. Do not drink alcohol if: Your health care provider tells you not to drink. You are pregnant, may be pregnant, or are planning to become pregnant. If you drink alcohol: Limit how much you have to: 0-1 drink a  day for women. 0-2 drinks a day for men. Know how much alcohol is in your drink. In the [place] one drink equals one 12 oz bottle of beer (355 mL), one 5 oz glass of wine (148 mL), or one 1 oz glass of hard liquor (44 mL). Keep yourself hydrated with water, diet soda, or unsweetened iced tea. Keep in mind that regular soda, juice, and other mixers may contain a lot of sugar and must be counted as carbs. What are tips for following this plan?  Reading food labels Start by checking the serving size on the Nutrition Facts label of packaged foods and drinks. The number of calories and the amount of carbs, fats, and other nutrients listed on the label are based on one serving of the item. Many items contain more than one serving per package. Check the total grams (g) of carbs in one serving. Check the number of grams of saturated fats and trans fats in one serving. Choose foods that have a low amount or none of these fats. Check the number of milligrams (mg) of salt (sodium) in one serving. Most people should limit total sodium intake to less than 2,300 mg per day. Always check the nutrition information of foods labeled as "low-fat" or "nonfat." These foods may be higher in added sugar or refined carbs and should be avoided. Talk to your dietitian to identify your daily goals for nutrients listed on the label. Shopping Avoid buying canned, pre-made, or processed foods. These foods tend to be high in fat, sodium, and added sugar. Shop around the outside edge of the  grocery store. This is where you will most often find fresh fruits and vegetables, bulk grains, fresh meats, and fresh dairy products. Cooking Use low-heat cooking methods, such as baking, instead of high-heat cooking methods, such as deep frying. Cook using healthy oils, such as olive, canola, or sunflower oil. Avoid cooking with butter, cream, or high-fat meats. Meal planning Eat meals and snacks regularly, preferably at the same times  every day. Avoid going long periods of time without eating. Eat foods that are high in fiber, such as fresh fruits, vegetables, beans, and whole grains. Eat 4-6 oz (112-168 g) of lean protein each day, such as lean meat, chicken, fish, eggs, or tofu. One ounce (oz) (28 g) of lean protein is equal to: 1 oz (28 g) of meat, chicken, or fish. 1 egg.  cup (62 g) of tofu. Eat some foods each day that contain healthy fats, such as avocado, nuts, seeds, and fish. What foods should I eat? Fruits Berries. Apples. Oranges. Peaches. Apricots. Plums. Grapes. Mangoes. Papayas. Pomegranates. Kiwi. Cherries. Vegetables Leafy greens, including lettuce, spinach, kale, chard, collard greens, mustard greens, and cabbage. Beets. Cauliflower. Broccoli. Carrots. Green beans. Tomatoes. Peppers. Onions. Cucumbers. Brussels sprouts. Grains Whole grains, such as whole-wheat or whole-grain bread, crackers, tortillas, cereal, and pasta. Unsweetened oatmeal. Quinoa. Brown or wild rice. Meats and other proteins Seafood. Poultry without skin. Lean cuts of poultry and beef. Tofu. Nuts. Seeds. Dairy Low-fat or fat-free dairy products such as milk, yogurt, and cheese. The items listed above may not be a complete list of foods and beverages you can eat and drink. Contact a dietitian for more information. What foods should I avoid? Fruits Fruits canned with syrup. Vegetables Canned vegetables. Frozen vegetables with butter or cream sauce. Grains Refined white flour and flour products such as bread, pasta, snack foods, and cereals. Avoid all processed foods. Meats and other proteins Fatty cuts of meat. Poultry with skin. Breaded or fried meats. Processed meat. Avoid saturated fats. Dairy Full-fat yogurt, cheese, or milk. Beverages Sweetened drinks, such as soda or iced tea. The items listed above may not be a complete list of foods and beverages you should avoid. Contact a dietitian for more information. Questions to  ask a health care provider Do I need to meet with a certified diabetes care and education specialist? Do I need to meet with a dietitian? What number can I call if I have questions? When are the best times to check my blood glucose? Where to find more information: American Diabetes Association: diabetes.org Academy of Nutrition and Dietetics: eatright[employer] of Diabetes and Digestive and Kidney Diseases: StageSync.si Association of Diabetes Care & Education Specialists: diabeteseducator.org Summary It is important to have healthy eating habits because your blood sugar (glucose) levels are greatly affected by what you eat and drink. It is important to use alcohol carefully. A healthy meal plan will help you manage your blood glucose and lower your risk of heart disease. Your health care provider may recommend that you work with a dietitian to make a meal plan that is best for you. This information is not intended to replace advice given to you by your health care provider. Make sure you discuss any questions you have with your health care provider. Document Revised: 05/13/2020 Document Reviewed: 05/13/2020 Elsevier Patient Education  2024 [employer].

## 2023-10-17 LAB — BMP8+EGFR
BUN/Creatinine Ratio: 21 (ref 12–28)
BUN: 27 mg/dL (ref 8–27)
CO2: 26 mmol/L (ref 20–29)
Calcium: 10.7 mg/dL — ABNORMAL HIGH (ref 8.7–10.3)
Chloride: 97 mmol/L (ref 96–106)
Creatinine, Ser: 1.3 mg/dL — ABNORMAL HIGH (ref 0.57–1.00)
Glucose: 136 mg/dL — ABNORMAL HIGH (ref 70–99)
Potassium: 5 mmol/L (ref 3.5–5.2)
Sodium: 137 mmol/L (ref 134–144)
eGFR: 45 mL/min/{1.73_m2} — ABNORMAL LOW (ref >59)

## 2023-10-17 LAB — HEMOGLOBIN A1C
Est. average glucose Bld gHb Est-mCnc: 126 mg/dL
Hgb A1c MFr Bld: 6 % — ABNORMAL HIGH (ref 4.8–5.6)

## 2023-10-19 ENCOUNTER — Encounter: Payer: Self-pay | Admitting: Internal Medicine

## 2023-10-28 DIAGNOSIS — F411 Generalized anxiety disorder: Secondary | ICD-10-CM | POA: Diagnosis not present

## 2023-10-28 DIAGNOSIS — E663 Overweight: Secondary | ICD-10-CM | POA: Insufficient documentation

## 2023-10-28 NOTE — Assessment & Plan Note (Signed)
 Chronic, no longer requires medication. She is encouraged to follow dietary compliance. She will f/u in four to six months for re-evaluation.

## 2023-10-28 NOTE — Assessment & Plan Note (Signed)
 She is encouraged to aim for at least 150 minutes of exercise per week.

## 2023-10-28 NOTE — Assessment & Plan Note (Signed)
 Chronic, fair control. Goal BP<120/80.  She admits to having a stressful day.  No med changes today.  She will continue with metoprolol  tartrate 100mg  twice daily, amlodipine  2.5mg  and telmisartan  80mg  daily. She is encouraged to follow low sodium diet. She agrees to f/u in six months for re-evaluation.

## 2023-10-30 ENCOUNTER — Ambulatory Visit: Payer: Medicare PPO | Admitting: Hematology

## 2023-10-30 ENCOUNTER — Telehealth: Payer: Self-pay | Admitting: Hematology

## 2023-10-30 ENCOUNTER — Other Ambulatory Visit: Payer: Medicare PPO

## 2023-11-02 ENCOUNTER — Ambulatory Visit: Payer: Self-pay | Admitting: Licensed Clinical Social Worker

## 2023-11-02 DIAGNOSIS — H25013 Cortical age-related cataract, bilateral: Secondary | ICD-10-CM | POA: Diagnosis not present

## 2023-11-02 DIAGNOSIS — H2513 Age-related nuclear cataract, bilateral: Secondary | ICD-10-CM | POA: Diagnosis not present

## 2023-11-02 DIAGNOSIS — H25043 Posterior subcapsular polar age-related cataract, bilateral: Secondary | ICD-10-CM | POA: Diagnosis not present

## 2023-11-02 DIAGNOSIS — H18413 Arcus senilis, bilateral: Secondary | ICD-10-CM | POA: Diagnosis not present

## 2023-11-02 DIAGNOSIS — H2512 Age-related nuclear cataract, left eye: Secondary | ICD-10-CM | POA: Diagnosis not present

## 2023-11-03 ENCOUNTER — Telehealth (HOSPITAL_BASED_OUTPATIENT_CLINIC_OR_DEPARTMENT_OTHER): Payer: Self-pay

## 2023-11-03 ENCOUNTER — Inpatient Hospital Stay (HOSPITAL_BASED_OUTPATIENT_CLINIC_OR_DEPARTMENT_OTHER): Payer: HMO | Admitting: Hematology

## 2023-11-03 ENCOUNTER — Inpatient Hospital Stay: Payer: HMO | Attending: Nurse Practitioner

## 2023-11-03 ENCOUNTER — Encounter: Payer: Self-pay | Admitting: Hematology

## 2023-11-03 VITALS — BP 170/80 | HR 64 | Temp 97.8°F | Resp 15 | Wt 156.6 lb

## 2023-11-03 DIAGNOSIS — I10 Essential (primary) hypertension: Secondary | ICD-10-CM | POA: Diagnosis not present

## 2023-11-03 DIAGNOSIS — E1136 Type 2 diabetes mellitus with diabetic cataract: Secondary | ICD-10-CM | POA: Insufficient documentation

## 2023-11-03 DIAGNOSIS — D473 Essential (hemorrhagic) thrombocythemia: Secondary | ICD-10-CM

## 2023-11-03 DIAGNOSIS — Z130 Encounter for screening for diseases of the blood and blood-forming organs and certain disorders involving the immune mechanism: Secondary | ICD-10-CM

## 2023-11-03 LAB — CBC WITH DIFFERENTIAL (CANCER CENTER ONLY)
Abs Immature Granulocytes: 0.02 10*3/uL (ref 0.00–0.07)
Basophils Absolute: 0.1 10*3/uL (ref 0.0–0.1)
Basophils Relative: 1 %
Eosinophils Absolute: 0.2 10*3/uL (ref 0.0–0.5)
Eosinophils Relative: 3 %
HCT: 43.5 % (ref 36.0–46.0)
Hemoglobin: 14.4 g/dL (ref 12.0–15.0)
Immature Granulocytes: 0 %
Lymphocytes Relative: 18 %
Lymphs Abs: 1.3 10*3/uL (ref 0.7–4.0)
MCH: 29.5 pg (ref 26.0–34.0)
MCHC: 33.1 g/dL (ref 30.0–36.0)
MCV: 89.1 fL (ref 80.0–100.0)
Monocytes Absolute: 0.6 10*3/uL (ref 0.1–1.0)
Monocytes Relative: 8 %
Neutro Abs: 4.8 10*3/uL (ref 1.7–7.7)
Neutrophils Relative %: 70 %
Platelet Count: 406 10*3/uL — ABNORMAL HIGH (ref 150–400)
RBC: 4.88 MIL/uL (ref 3.87–5.11)
RDW: 13 % (ref 11.5–15.5)
WBC Count: 7 10*3/uL (ref 4.0–10.5)
nRBC: 0 % (ref 0.0–0.2)

## 2023-11-03 NOTE — Assessment & Plan Note (Signed)
-  JAK2(+) --diagnosed in 2010, JAK2 mutation present. Bone marrow aspirate and biopsy on 01/20/09 was non-diagnostic. However, megakaryocytes were abundant with normal morphology. There was no clustering or excess blasts seen. Platelet count on 01/20/09 was 881k.  -started Hydrea in 2010, dose adjusted as needed. Switched to anagrelide in 07/2016 due to concern about risk of leukemia. She is currently on 1mg daily, since 01/28/21, tolerating well with no noticeable side effects. 

## 2023-11-03 NOTE — Telephone Encounter (Signed)
   Pre-operative Risk Assessment    Patient Name: Sara [name]  DOB: [birth year] MRN: [id number]   Date of last office visit: 10/04/23 Dr. Jerel Croitoru Date of next office visit: None   Request for Surgical Clearance    Procedure:   Cataract extraction w/ intraocular Lens implantation of the LT eye followed by the RT eye   Date of Surgery:  Clearance 12/20/23 -01/03/24                               Surgeon:  Dr. Evalene Raw Surgeon's Group or Practice Name:  [facility] Phone number:  [phone] Fax number:  [phone]   Type of Clearance Requested:   - Medical  - Pharmacy:  Hold Aspirin  ?   Type of Anesthesia:  Not Indicated   Additional requests/questions:    Bonney Huxley Joycie Aerts   11/03/2023, 10:22 AM
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
## 2023-11-03 NOTE — Telephone Encounter (Signed)
   Patient Name: Sara [name]  DOB: [birth year] MRN: [id number]  Primary Cardiologist: Jerel Balding, MD  Chart reviewed as part of pre-operative protocol coverage. Cataract extractions are recognized in guidelines as low risk surgeries that do not typically require specific preoperative testing or holding of blood thinner therapy. Therefore, given past medical history and time since last visit, based on ACC/AHA guidelines, Sara [name] would be at acceptable risk for the planned procedure without further cardiovascular testing.   I will route this recommendation to the requesting party via Epic fax function and remove from pre-op  pool.  Please call with questions.  Sara LOISE Fabry, PA-C 11/03/2023, 10:31 AM

## 2023-11-04 ENCOUNTER — Encounter: Payer: Self-pay | Admitting: Hematology

## 2023-11-05 ENCOUNTER — Encounter: Payer: Self-pay | Admitting: Internal Medicine

## 2023-11-07 NOTE — Patient Outreach (Signed)
  Care Coordination   Follow Up Visit Note   11/02/2023 Name: Sara [name] MRN: [id number] DOB: August 05, 1956  Sara [name] is a 68 y.o. year old female who sees Jarold Medici, MD for primary care. I spoke with  Sara [name] by phone today.  What matters to the patients health and wellness today?  Symptom Management    Goals Addressed             This Visit's Progress    LCSW-Strengthen Support   On track    Activities and task to complete in order to accomplish goals.   Keep all upcoming appointments discussed today Continue with compliance of taking medication prescribed by Doctor Implement healthy coping skills discussed to assist with management of symptoms Follow up with community agencies regarding goals for son Solution-Focused Strategies employed:  Active listening / Reflection utilized  Emotional Support Provided Patient identified stressors that negatively impact mental health Validation and encouragement provided Patient is motivated to continue with management of health conditions Healthy coping skills discussed         SDOH assessments and interventions completed:  No     Care Coordination Interventions:  Yes, provided  Interventions Today    Flowsheet Row Most Recent Value  Chronic Disease   Chronic disease during today's visit Hypertension (HTN), Diabetes, Chronic Kidney Disease/End Stage Renal Disease (ESRD)  General Interventions   General Interventions Discussed/Reviewed General Interventions Reviewed, [facility], Doctor Visits  [Pt reports her calcium  was elevated. Seeing eye surgeon today to assess cataracts tx]  Doctor Visits Discussed/Reviewed Doctor Visits Reviewed  Mental Health Interventions   Mental Health Discussed/Reviewed Mental Health Reviewed, Coping Strategies, Anxiety, Depression  [Stress management, scammed during holidays. Practicing gratitude thinking. Identified patterns/triggers that negatively impact symptoms.  Garnette from [facility] visited home yesterday, been calling ten group homes for son]  Nutrition Interventions   Nutrition Discussed/Reviewed Nutrition Reviewed  [May make modifications to diet to assist with calcium ]       Follow up plan: Follow up call scheduled for 4-6 weeks    Encounter Outcome:  Patient Visit Completed   Rolin Kerns, MSW, LCSW [facility] Care Management [facility]  [facility] [email or web address] Phone [phone] 10:24 AM

## 2023-11-07 NOTE — Patient Instructions (Signed)
 Visit Information  Thank you for taking time to visit with me today. Please don't hesitate to contact me if I can be of assistance to you before our next scheduled telephone appointment.  Following are the goals we discussed today:  (Copy and paste patient goals from clinical care plan here)  Our next appointment is by telephone on 11/23/23 at 10 AM  Please call the care guide team at [phone] if you need to cancel or reschedule your appointment.   If you are experiencing a Mental Health or Behavioral Health Crisis or need someone to talk to, please call the Suicide and Crisis Lifeline: 988 call 911   Patient verbalizes understanding of instructions and care plan provided today and agrees to view in MyChart. Active MyChart status and patient understanding of how to access instructions and care plan via MyChart confirmed with patient.     [name], MSW, LCSW [facility] Care Management [place]  [facility] [email or web address] Phone [phone] 10:26 AM

## 2023-11-13 ENCOUNTER — Ambulatory Visit: Payer: Self-pay

## 2023-11-13 DIAGNOSIS — E1169 Type 2 diabetes mellitus with other specified complication: Secondary | ICD-10-CM

## 2023-11-13 DIAGNOSIS — I10 Essential (primary) hypertension: Secondary | ICD-10-CM

## 2023-11-14 NOTE — Patient Outreach (Signed)
Care Coordination   Follow Up Visit Note   11/13/2023 Name: Sara [name] MRN: [id number] DOB: 1956-03-22  Sara [name] is a 68 y.o. year old female who sees Dorothyann Peng, MD for primary care. I spoke with  Sara [name] by phone today.  What matters to the patients health and wellness today?  Patient would like to undergo left eye cataract surgery without complications.     Goals Addressed               This Visit's Progress     Patient Stated     I want to keep my blood pressure under good control (pt-stated)   On track     Care Coordination Interventions: Evaluation of current treatment plan related to hypertension self management and patient's adherence to plan as established by provider Review of patient status, including review of consultant's reports, relevant laboratory and other test results, and medications completed Advised patient, providing education and rationale, to monitor blood pressure daily and record, calling PCP for findings outside established parameters, with goal BP <130/80 Educated patient on the importance to stay well hydrated with water, aiming for 48-64 oz of water daily and to adhere to a low Sodium diet  Assessed for SDOH barriers  Last practice recorded BP readings:  BP Readings from Last 3 Encounters:  11/03/23 (!) 170/80  10/16/23 138/80  10/04/23 130/80   Most recent eGFR/CrCl:  Lab Results  Component Value Date   EGFR 45 (L) 10/16/2023    No components found for: "CRCL"      Other     To undergo cataract removal without complications        Care Coordination Interventions: Evaluation of current treatment plan related to left eye cataract removal  and patient's adherence to plan as established by provider Reviewed and discussed with patient she is scheduled for left eye cataract removal on 11/23/23 with her right eye to follow once left eye has healed  Discussed plans with patient for ongoing care coordination follow up and  provided patient with direct contact information for nurse care coordinator       To work on lowering A1c   On track     Care Coordination Interventions: Provided education to patient about basic DM disease process Reviewed medications with patient and discussed importance of medication adherence Review of patient status, including review of consultants reports, relevant laboratory and other test results, and medications completed Counseled on the importance of exercise goals with target of 150 minutes per week  Positive reinforcement given to patient for making efforts to improve her diabetes and overall health  Last practice recorded BP readings:  BP Readings from Last 3 Encounters:  11/03/23 (!) 170/80  10/16/23 138/80  10/04/23 130/80   Most recent eGFR/CrCl:  Lab Results  Component Value Date   EGFR 45 (L) 10/16/2023    No components found for: "CRCL"     Interventions Today    Flowsheet Row Most Recent Value  Chronic Disease   Chronic disease during today's visit Diabetes, Hypertension (HTN), Other  [Cataract extraction]  General Interventions   General Interventions Discussed/Reviewed General Interventions Discussed, General Interventions Reviewed, Doctor Visits, Labs, Durable Medical Equipment (DME)  Doctor Visits Discussed/Reviewed Doctor Visits Reviewed, Doctor Visits Discussed, PCP, Specialist  Durable Medical Equipment (DME) BP Cuff  Education Interventions   Education Provided Provided Education  Provided Verbal Education On Medication, Nutrition, When to see the doctor, Other, Mental Health/Coping with Illness  [BP monitoring]  Mental Health Interventions   Mental Health Discussed/Reviewed Mental Health Discussed, Mental Health Reviewed, Anxiety, Coping Strategies  Nutrition Interventions   Nutrition Discussed/Reviewed Nutrition Discussed, Nutrition Reviewed, Decreasing salt  Pharmacy Interventions   Pharmacy Dicussed/Reviewed Pharmacy Topics Discussed, Pharmacy  Topics Reviewed, Medications and their functions          SDOH assessments and interventions completed:  Yes  SDOH Interventions Today    Flowsheet Row Most Recent Value  SDOH Interventions   Utilities Interventions Intervention Not Indicated  Physical Activity Interventions Intervention Not Indicated  Stress Interventions Other (Comment)  [patient established with LCSW Jasmine Lewis]        Care Coordination Interventions:  Yes, provided   Follow up plan: Follow up call scheduled for 11/28/23 @12 :30 PM    Encounter Outcome:  Patient Visit Completed

## 2023-11-14 NOTE — Patient Instructions (Signed)
Visit Information  Thank you for taking time to visit with me today. Please don't hesitate to contact me if I can be of assistance to you.   Following are the goals we discussed today:   Goals Addressed               This Visit's Progress     Patient Stated     I want to keep my blood pressure under good control (pt-stated)   On track     Care Coordination Interventions: Evaluation of current treatment plan related to hypertension self management and patient's adherence to plan as established by provider Review of patient status, including review of consultant's reports, relevant laboratory and other test results, and medications completed Advised patient, providing education and rationale, to monitor blood pressure daily and record, calling PCP for findings outside established parameters, with goal BP <130/80 Educated patient on the importance to stay well hydrated with water, aiming for 48-64 oz of water daily and to adhere to a low Sodium diet  Assessed for SDOH barriers  Last practice recorded BP readings:  BP Readings from Last 3 Encounters:  11/03/23 (!) 170/80  10/16/23 138/80  10/04/23 130/80   Most recent eGFR/CrCl:  Lab Results  Component Value Date   EGFR 45 (L) 10/16/2023    No components found for: "CRCL"         Other     To undergo cataract removal without complications        Care Coordination Interventions: Evaluation of current treatment plan related to left eye cataract removal  and patient's adherence to plan as established by provider Reviewed and discussed with patient she is scheduled for left eye cataract removal on 11/23/23 with her right eye to follow once left eye has healed  Discussed plans with patient for ongoing care coordination follow up and provided patient with direct contact information for nurse care coordinator         To work on lowering A1c   On track     Care Coordination Interventions: Provided education to patient about basic  DM disease process Reviewed medications with patient and discussed importance of medication adherence Review of patient status, including review of consultants reports, relevant laboratory and other test results, and medications completed Counseled on the importance of exercise goals with target of 150 minutes per week  Positive reinforcement given to patient for making efforts to improve her diabetes and overall health  Last practice recorded BP readings:  BP Readings from Last 3 Encounters:  11/03/23 (!) 170/80  10/16/23 138/80  10/04/23 130/80   Most recent eGFR/CrCl:  Lab Results  Component Value Date   EGFR 45 (L) 10/16/2023    No components found for: "CRCL"           Our next appointment is by telephone on 11/28/23 at 12:30 PM  Please call the care guide team at [phone] if you need to cancel or reschedule your appointment.   If you are experiencing a Mental Health or Behavioral Health Crisis or need someone to talk to, please call 1-800-273-TALK (toll free, 24 hour hotline)  Patient verbalizes understanding of instructions and care plan provided today and agrees to view in MyChart. Active MyChart status and patient understanding of how to access instructions and care plan via MyChart confirmed with patient.     Delsa Sale RN BSN CCM [place]  [facility], [facility] Health Nurse Care Coordinator  Direct Dial: [phone] Website: [email or web address]

## 2023-11-14 NOTE — Addendum Note (Signed)
Addended by: Riley Churches on: 11/14/2023 05:05 PM   Modules accepted: Orders

## 2023-11-23 ENCOUNTER — Encounter: Payer: Self-pay | Admitting: Licensed Clinical Social Worker

## 2023-11-23 ENCOUNTER — Ambulatory Visit: Payer: Self-pay | Admitting: Licensed Clinical Social Worker

## 2023-11-23 ENCOUNTER — Encounter: Payer: Medicare PPO | Admitting: Licensed Clinical Social Worker

## 2023-11-25 NOTE — Patient Outreach (Signed)
  Care Coordination   Multidisciplinary Case Review Note   11/13/2023 Name: Sara [name] MRN: [id number] DOB: April 06, 1956  Sara [name] is a 68 y.o. year old female who sees Sara Peng, MD for primary care.  The  multidisciplinary care team met today to review patient care needs and barriers.     Goals Addressed   None     SDOH assessments and interventions completed:  No     Care Coordination Interventions Activated:  Yes   Care Coordination Interventions:  Yes, provided  Interventions Today    Flowsheet Row Most Recent Value  Chronic Disease   Chronic disease during today's visit Diabetes, Hypertension (HTN)  General Interventions   General Interventions Discussed/Reviewed Communication with  Communication with RN  [employer] collaborated with [facility] regarding patient care needs]       Follow up plan: Follow up call scheduled for 2-4 weeks    Multidisciplinary Team Attendees:   Delsa Sale, RNCM Jenel Lucks, LCSW  Scribe for Multidisciplinary Case Review:   Jenel Lucks, LCSW [facility]  [facility] Clinical Social Worker Direct Dial: [phone]  Fax: [phone] Website: [email or web address] 4:15 PM

## 2023-11-28 ENCOUNTER — Ambulatory Visit: Payer: Self-pay

## 2023-11-28 NOTE — Patient Outreach (Signed)
  Care Coordination   Follow Up Visit Note   11/28/2023 Name: KEYANAH KOZICKI MRN: [id number] DOB: 05/28/1956  [name] is a 68 y.o. year old female who sees Jarold Medici, MD for primary care. I spoke with  [name] by phone today.  What matters to the patients health and wellness today?  Patient would like to make a full recovery from her left Cataract removal.     Goals Addressed             This Visit's Progress    To undergo cataract removal without complications       Care Coordination Interventions: Evaluation of current treatment plan related to left eye cataract removal  and patient's adherence to plan as established by provider Reviewed and discussed with patient her left eye cataract removal has been rescheduled for 12/20/23 Discussed plans with patient for ongoing care coordination follow up and provided patient with direct contact information for nurse care coordinator Scheduled nurse care coordination follow up call for 12/27/23 @12 :30 PM       Interventions Today    Flowsheet Row Most Recent Value  Chronic Disease   Chronic disease during today's visit Other  [left eye Cataract Extraction]  General Interventions   General Interventions Discussed/Reviewed General Interventions Discussed, General Interventions Reviewed, Doctor Visits  Doctor Visits Discussed/Reviewed Doctor Visits Reviewed, Doctor Visits Discussed, Specialist          SDOH assessments and interventions completed:  No     Care Coordination Interventions:  Yes, provided   Follow up plan: Follow up call scheduled for 12/27/23 @12 :30 PM    Encounter Outcome:  Patient Visit Completed

## 2023-11-28 NOTE — Patient Instructions (Signed)
Visit Information  Thank you for taking time to visit with me today. Please don't hesitate to contact me if I can be of assistance to you.   Following are the goals we discussed today:   Goals Addressed             This Visit's Progress    To undergo cataract removal without complications       Care Coordination Interventions: Evaluation of current treatment plan related to left eye cataract removal  and patient's adherence to plan as established by provider Reviewed and discussed with patient her left eye cataract removal has been rescheduled for 12/20/23 Discussed plans with patient for ongoing care coordination follow up and provided patient with direct contact information for nurse care coordinator Scheduled nurse care coordination follow up call for 12/27/23 @12 :30 PM           Our next appointment is by telephone on 12/27/23 at 12:30 PM  Please call the care guide team at [phone] if you need to cancel or reschedule your appointment.   If you are experiencing a Mental Health or Behavioral Health Crisis or need someone to talk to, please call 1-800-273-TALK (toll free, 24 hour hotline)  Patient verbalizes understanding of instructions and care plan provided today and agrees to view in MyChart. Active MyChart status and patient understanding of how to access instructions and care plan via MyChart confirmed with patient.     Delsa Sale RN BSN CCM [place]  [facility], [facility] Nurse Care Coordinator  Direct Dial: [phone] Website: [email or web address]

## 2023-11-28 NOTE — Patient Outreach (Signed)
  Care Coordination   Follow Up Visit Note   11/23/2023 Name: Sara [name] MRN: [id number] DOB: 15-Jun-1956  Sara [name] is a 68 y.o. year old female who sees Dorothyann Peng, MD for primary care. I spoke with  Sara [name] by phone today.  What matters to the patients health and wellness today?  Stress Management and Supportive Resources    Goals Addressed             This Visit's Progress    LCSW-Strengthen Support   On track    Activities and task to complete in order to accomplish goals.   Keep all upcoming appointments discussed today Continue with compliance of taking medication prescribed by Doctor Implement healthy coping skills discussed to assist with management of symptoms Follow up with community agencies regarding goals for son Solution-Focused Strategies employed:  Active listening / Reflection utilized  Emotional Support Provided Patient identified stressors that negatively impact mental health Validation and encouragement provided Patient is motivated to continue with management of health conditions Healthy coping skills discussed         SDOH assessments and interventions completed:  No     Care Coordination Interventions:  Yes, provided  Interventions Today    Flowsheet Row Most Recent Value  Chronic Disease   Chronic disease during today's visit Diabetes, Hypertension (HTN), Other  [Financial Stress]  General Interventions   General Interventions Discussed/Reviewed General Interventions Reviewed, [employer], Doctor Visits  [Patient is working with 3rd party warranty about car repairs. Will provide update to LCSW]  Doctor Visits Discussed/Reviewed Doctor Visits Reviewed  Communication with Social Work  [employer] collaborated with [employer] regarding pt's interest in support resources for car repairs]  Mental Health Interventions   Mental Health Discussed/Reviewed Mental Health Reviewed, Coping Strategies  [Stress management strategies  discussed]       Follow up plan: Follow up call scheduled for 2-4 weeks    Encounter Outcome:  Patient Visit Completed   Jenel Lucks, LCSW [place]  [facility], [facility] Clinical Social Worker Direct Dial: [phone]  Fax: [phone] Website: [email or web address] 12:22 PM

## 2023-11-28 NOTE — Patient Instructions (Signed)
Visit Information  Thank you for taking time to visit with me today. Please don't hesitate to contact me if I can be of assistance to you.   Following are the goals we discussed today:   Goals Addressed             This Visit's Progress    LCSW-Strengthen Support   On track    Activities and task to complete in order to accomplish goals.   Keep all upcoming appointments discussed today Continue with compliance of taking medication prescribed by Doctor Implement healthy coping skills discussed to assist with management of symptoms Follow up with community agencies regarding goals for son Solution-Focused Strategies employed:  Active listening / Reflection utilized  Emotional Support Provided Patient identified stressors that negatively impact mental health Validation and encouragement provided Patient is motivated to continue with management of health conditions Healthy coping skills discussed         Our next appointment is by telephone on 2/27 at 1 PM  Please call the care guide team at [phone] if you need to cancel or reschedule your appointment.   If you are experiencing a Mental Health or Behavioral Health Crisis or need someone to talk to, please call the Suicide and Crisis Lifeline: 988 call 911   Patient verbalizes understanding of instructions and care plan provided today and agrees to view in MyChart. Active MyChart status and patient understanding of how to access instructions and care plan via MyChart confirmed with patient.     Windy Fast [facility]  [facility], [facility] Clinical Social Worker Direct Dial: [phone]  Fax: [phone] Website: [email or web address] 12:22 PM

## 2023-12-06 ENCOUNTER — Ambulatory Visit (INDEPENDENT_AMBULATORY_CARE_PROVIDER_SITE_OTHER): Payer: PPO

## 2023-12-06 DIAGNOSIS — Z0000 Encounter for general adult medical examination without abnormal findings: Secondary | ICD-10-CM

## 2023-12-06 NOTE — Progress Notes (Signed)
Subjective:   Sara [name] is a 68 y.o. female who presents for Medicare Annual (Subsequent) preventive examination.  Visit Complete: Virtual I connected with  [name] on 12/06/23 by a audio enabled telemedicine application and verified that I am speaking with the correct person using two identifiers. Interactive audio and video telecommunications were attempted between this provider and patient, however failed, due to patient having technical difficulties OR patient did not have access to video capability.  We continued and completed visit with audio only.  Patient Location: Home  Provider Location: Office/Clinic  I discussed the limitations of evaluation and management by telemedicine. The patient expressed understanding and agreed to proceed.  Vital Signs: Because this visit was a virtual/telehealth visit, some criteria may be missing or patient reported. Any vitals not documented were not able to be obtained and vitals that have been documented are patient reported.    Cardiac Risk Factors include: advanced age (>49men, >51 women);diabetes mellitus;hypertension     Objective:    Today's Vitals   There is no height or weight on file to calculate BMI.     12/06/2023    8:22 AM 11/17/2022   11:21 AM 12/31/2021    9:53 AM 11/11/2021    8:44 AM 06/19/2021    3:13 AM 06/17/2021    2:40 PM 06/11/2021   11:10 AM  Advanced Directives  Does Patient Have a Medical Advance Directive? No No No No No No No  Would patient like information on creating a medical advance directive? No - Patient declined No - Patient declined No - Patient declined No - Patient declined  No - Patient declined     Current Medications (verified) Outpatient Encounter Medications as of 12/06/2023  Medication Sig   amLODipine (NORVASC) 2.5 MG tablet Take 1 tablet (2.5 mg total) by mouth daily.   anagrelide ([name]) 1 MG capsule TAKE 1 CAPSULE(1 MG) BY MOUTH DAILY   aspirin EC 81 MG tablet Take 81 mg  by mouth daily. Swallow whole.   atorvastatin (LIPITOR) 40 MG tablet Take 1 tablet (40 mg total) by mouth daily.   Cholecalciferol (VITAMIN D3) 2000 UNITS capsule Take 2,000 Units by mouth daily.    hydrOXYzine (ATARAX) 10 MG tablet Take 10 mg by mouth 3 (three) times daily as needed.   Lacosamide 150 MG TABS TAKE 1 TABLET(150 MG) BY MOUTH TWICE DAILY   Magnesium 250 MG TABS Take 250 mg by mouth daily.   metoprolol tartrate (LOPRESSOR) 100 MG tablet Take 1 tablet (100 mg total) by mouth 2 (two) times daily.   Prenatal Vit-Fe Fumarate-FA (PRENATAL MULTIVITAMIN/IRON PO) Take by mouth.   telmisartan (MICARDIS) 80 MG tablet Take 1 tablet (80 mg total) by mouth daily.   No facility-administered encounter medications on file as of 12/06/2023.    Allergies (verified) Sulfa antibiotics   History: Past Medical History:  Diagnosis Date   Anxiety    Blood dyscrasia    Chronic kidney disease    Gout    Hyperlipemia    Hypertension    LVH (left ventricular hypertrophy)    12/21/09 Echo -mild asymmetric LVH,EF =>55%   Morbid obesity (HCC)    Myocardial infarction (HCC)    Nausea vomiting and diarrhea    OSA (obstructive sleep apnea)    PAD (peripheral artery disease) (HCC)    Pneumonia    Pre-diabetes    Renal artery stenosis (HCC) 12/13/2010   PTCA and Stent - right   Seizures (HCC)  Syncopal episodes    Thrombocytosis    Type 2 diabetes mellitus (HCC) 07/20/2013   Past Surgical History:  Procedure Laterality Date   BREAST CYST EXCISION     S/P Benign Right Breast Cyst Removal.   BREAST REDUCTION SURGERY  1995   S/P Bilateral breast reduction   CESAREAN SECTION  1980, 1986   Times  Two.   CORONARY ARTERY BYPASS GRAFT N/A 04/21/2017   Procedure: CORONARY ARTERY BYPASS GRAFTING (CABG) x 2, USING LEFT MAMMARY ARTERY AND RIGHT GREATER SAPHENOUS VEIN HARVESTED ENDOSCOPICALLY;  Surgeon: Alleen Borne, MD;  Location: [facility] OR;  Service: Open Heart Surgery;  Laterality: N/A;   EXTREMITY  CYST EXCISION  1993   left wrist   LEFT HEART CATH AND CORONARY ANGIOGRAPHY N/A 04/17/2017   Procedure: Left Heart Cath and Coronary Angiography;  Surgeon: Corky Crafts, MD;  Location: [facility] INVASIVE CV LAB;  Service: Cardiovascular;  Laterality: N/A;   PARTIAL HYSTERECTOMY  1994   renal artery stent placement Right    TEE WITHOUT CARDIOVERSION N/A 04/21/2017   Procedure: TRANSESOPHAGEAL ECHOCARDIOGRAM (TEE);  Surgeon: Alleen Borne, MD;  Location: [facility] OR;  Service: Open Heart Surgery;  Laterality: N/A;   VENTRAL HERNIA REPAIR N/A 06/17/2021   Procedure: OPEN VENTRAL HERNIA REPAIR WITH MESH;  Surgeon: Kinsinger, De Blanch, MD;  Location: [facility] ORS;  Service: General;  Laterality: N/A;   Family History  Problem Relation Age of Onset   Heart failure Mother    Diabetes Father    Diabetes Sister    Hypertension Sister    Hypertension Sister    Hypertension Sister    Breast cancer Maternal Aunt        2 maternal aunts had breast cancer.   Breast cancer Maternal Grandmother    Sleep apnea Neg Hx    Social History   Socioeconomic History   Marital status: Married    Spouse name: Leonette Most   Number of children: 2   Years of education: 16   Highest education level: Not on file  Occupational History   Occupation: TRANSPORTATION COOR.    Employer: [employer]  Tobacco Use   Smoking status: Never   Smokeless tobacco: Never  Vaping Use   Vaping status: Never Used  Substance and Sexual Activity   Alcohol use: No    Alcohol/week: 0.0 standard drinks of alcohol   Drug use: No   Sexual activity: Yes  Other Topics Concern   Not on file  Social History Narrative   Patient lives at husband and her son, home with family.   Patient has two adult children.   Patient is not drinking any caffeine.   Patient is working full-time.   Patient has a college education.   Patient is right-handed.   Social Drivers of Corporate investment banker Strain: Low Risk  (12/06/2023)   Overall  Financial Resource Strain (CARDIA)    Difficulty of Paying Living Expenses: Not hard at all  Food Insecurity: No Food Insecurity (12/06/2023)   Hunger Vital Sign    Worried About Running Out of Food in the Last Year: Never true    Ran Out of Food in the Last Year: Never true  Transportation Needs: No Transportation Needs (12/06/2023)   PRAPARE - Administrator, Civil Service (Medical): No    Lack of Transportation (Non-Medical): No  Physical Activity: Sufficiently Active (12/06/2023)   Exercise Vital Sign    Days of Exercise per Week: 7 days  Minutes of Exercise per Session: 30 min  Stress: No Stress Concern Present (12/06/2023)   [employer] of Occupational Health - Occupational Stress Questionnaire    Feeling of Stress : Not at all  Recent Concern: Stress - Stress Concern Present (11/13/2023)   [employer] of Occupational Health - Occupational Stress Questionnaire    Feeling of Stress : Rather much  Social Connections: Socially Integrated (12/06/2023)   Social Connection and Isolation Panel [NHANES]    Frequency of Communication with Friends and Family: More than three times a week    Frequency of Social Gatherings with Friends and Family: Never    Attends Religious Services: More than 4 times per year    Active Member of [employer] or Organizations: Yes    Attends Engineer, structural: More than 4 times per year    Marital Status: Married    Tobacco Counseling Counseling given: Not Answered   Clinical Intake:  Pre-visit preparation completed: Yes  Pain : No/denies pain     Nutritional Risks: None Diabetes: Yes CBG done?: No Did pt. bring in CBG monitor from home?: No  How often do you need to have someone help you when you read instructions, pamphlets, or other written materials from your doctor or pharmacy?: 1 - Never  Interpreter Needed?: No  Information entered by :: NAllen LPN   Activities of Daily Living    12/06/2023    8:17 AM   In your present state of health, do you have any difficulty performing the following activities:  Hearing? 0  Vision? 0  Difficulty concentrating or making decisions? 0  Walking or climbing stairs? 0  Dressing or bathing? 0  Doing errands, shopping? 0  Preparing Food and eating ? N  Using the Toilet? N  In the past six months, have you accidently leaked urine? N  Do you have problems with loss of bowel control? N  Managing your Medications? N  Managing your Finances? N  Housekeeping or managing your Housekeeping? N    Patient Care Team: Dorothyann Peng, MD as PCP - General (Internal Medicine) Croitoru, Rachelle Hora, MD as PCP - Cardiology (Cardiology) Harlan Stains, RPH (Inactive) (Pharmacist) Malachy Mood, MD as Consulting Physician (Hematology and Oncology) Bridgett Larsson, LCSW as [facility] Care Management (Licensed Clinical Social Worker) [facility] Little, Karma Lew, RN as [facility] Care Management  Indicate any recent Medical Services you may have received from other than [facility] providers in the past year (date may be approximate).     Assessment:   This is a routine wellness examination for Kimbella.  Hearing/Vision screen Hearing Screening - Comments:: Denies hearing issues Vision Screening - Comments:: Regular eye exams, [facility]   Goals Addressed             This Visit's Progress    Patient Stated       12/06/2023, maintain weight       Depression Screen    12/06/2023    8:23 AM 06/12/2023    8:43 AM 02/09/2023    8:36 AM 11/17/2022   11:22 AM 06/01/2022    2:16 PM 06/01/2022    1:57 PM 11/11/2021    8:45 AM  PHQ 2/9 Scores  PHQ - 2 Score 0 0 0 0 2 0 3  PHQ- 9 Score 0 0 0  4  3    Fall Risk    12/06/2023    8:23 AM 06/12/2023    8:43 AM 11/17/2022   11:22  AM 06/01/2022    1:58 PM 11/11/2021    8:45 AM  Fall Risk   Falls in the past year? 0 0 0 0 0  Number falls in past yr: 0 0 0 0   Injury with Fall? 0 0 0 0   Risk for fall due to :  Medication side effect No Fall Risks Medication side effect No Fall Risks Medication side effect  Follow up Falls prevention discussed;Falls evaluation completed Falls evaluation completed Falls prevention discussed;Education provided;Falls evaluation completed Falls evaluation completed Falls evaluation completed;Education provided;Falls prevention discussed    MEDICARE RISK AT HOME: Medicare Risk at Home Any stairs in or around the home?: Yes If so, are there any without handrails?: No Home free of loose throw rugs in walkways, pet beds, electrical cords, etc?: Yes Adequate lighting in your home to reduce risk of falls?: Yes Life alert?: No Use of a cane, walker or w/c?: No Grab bars in the bathroom?: No Shower chair or bench in shower?: No Elevated toilet seat or a handicapped toilet?: Yes  TIMED UP AND GO:  Was the test performed?  No    Cognitive Function:    07/23/2019    8:07 AM  MMSE - Mini Mental State Exam  Orientation to time 5  Orientation to Place 5  Registration 3  Attention/ Calculation 5  Recall 2  Language- name 2 objects 2  Language- repeat 1  Language- follow 3 step command 3  Language- read & follow direction 1  Write a sentence 1  Copy design 1  Total score 29        12/06/2023    8:24 AM 11/17/2022   11:23 AM 11/11/2021    8:49 AM 11/04/2020    3:03 PM  6CIT Screen  What Year? 0 points 0 points 0 points 0 points  What month? 0 points 0 points 0 points 0 points  What time? 0 points 0 points 0 points 0 points  Count back from 20 0 points 0 points 0 points 0 points  Months in reverse 0 points 0 points 0 points 0 points  Repeat phrase 2 points 0 points 2 points 2 points  Total Score 2 points 0 points 2 points 2 points    Immunizations Immunization History  Administered Date(s) Administered   Fluad Quad(high Dose 65+) 11/04/2020, 07/19/2021, 10/10/2022   Influenza, High Dose Seasonal PF 08/08/2023   Influenza,inj,Quad PF,6+ Mos 07/09/2019    PFIZER(Purple Top)SARS-COV-2 Vaccination 11/30/2019, 12/23/2019, 08/10/2020   PNEUMOCOCCAL CONJUGATE-20 11/11/2021   Pfizer(Comirnaty)Fall Seasonal Vaccine 12 years and older 08/08/2023   Tdap 03/13/2019   Zoster Recombinant(Shingrix) 12/20/2021, 06/01/2022    TDAP status: Up to date  Flu Vaccine status: Up to date  Pneumococcal vaccine status: Up to date  Covid-19 vaccine status: Completed vaccines  Qualifies for Shingles Vaccine? Yes   Zostavax completed Yes   Shingrix Completed?: Yes  Screening Tests Health Maintenance  Topic Date Due   COVID-19 Vaccine (5 - 2024-25 season) 10/03/2023   HEMOGLOBIN A1C  04/15/2024   MAMMOGRAM  05/20/2024   Diabetic kidney evaluation - Urine ACR  06/11/2024   FOOT EXAM  06/11/2024   OPHTHALMOLOGY EXAM  07/24/2024   Diabetic kidney evaluation - eGFR measurement  10/15/2024   Medicare Annual Wellness (AWV)  12/05/2024   DTaP/Tdap/Td (2 - Td or Tdap) 03/12/2029   Colonoscopy  11/22/2032   Pneumonia Vaccine 61+ Years old  Completed   INFLUENZA VACCINE  Completed   DEXA SCAN  Completed  Hepatitis C Screening  Completed   Zoster Vaccines- Shingrix  Completed   HPV VACCINES  Aged Out    Health Maintenance  Health Maintenance Due  Topic Date Due   COVID-19 Vaccine (5 - 2024-25 season) 10/03/2023    Colorectal cancer screening: Type of screening: Colonoscopy. Completed 11/22/2022. Repeat every 10 years  Mammogram status: Completed 05/20/2022. Repeat every 2 years  Bone Density status: Completed 04/13/2018.   Lung Cancer Screening: (Low Dose CT Chest recommended if Age 19-80 years, 20 pack-year currently smoking OR have quit w/in 15years.) does not qualify.   Lung Cancer Screening Referral: no  Additional Screening:  Hepatitis C Screening: does qualify; Completed 11/28/2012  Vision Screening: Recommended annual ophthalmology exams for early detection of glaucoma and other disorders of the eye. Is the patient up to date with their  annual eye exam?  Yes  Who is the provider or what is the name of the office in which the patient attends annual eye exams? [facility] If pt is not established with a provider, would they like to be referred to a provider to establish care? No .   Dental Screening: Recommended annual dental exams for proper oral hygiene  Diabetic Foot Exam: Diabetic Foot Exam: Completed 06/12/2023  Community Resource Referral / Chronic Care Management: CRR required this visit?  No   CCM required this visit?  No     Plan:     I have personally reviewed and noted the following in the patient's chart:   Medical and social history Use of alcohol, tobacco or illicit drugs  Current medications and supplements including opioid prescriptions. Patient is not currently taking opioid prescriptions. Functional ability and status Nutritional status Physical activity Advanced directives List of other physicians Hospitalizations, surgeries, and ER visits in previous 12 months Vitals Screenings to include cognitive, depression, and falls Referrals and appointments  In addition, I have reviewed and discussed with patient certain preventive protocols, quality metrics, and best practice recommendations. A written personalized care plan for preventive services as well as general preventive health recommendations were provided to patient.     Barb Merino, LPN   [id number]/61/0960   After Visit Summary: (MyChart) Due to this being a telephonic visit, the after visit summary with patients personalized plan was offered to patient via MyChart   Nurse Notes: none

## 2023-12-06 NOTE — Patient Instructions (Signed)
Ms. Mullen , Thank you for taking time to come for your Medicare Wellness Visit. I appreciate your ongoing commitment to your health goals. Please review the following plan we discussed and let me know if I can assist you in the future.   Referrals/Orders/Follow-Ups/Clinician Recommendations: none  This is a list of the screening recommended for you and due dates:  Health Maintenance  Topic Date Due   COVID-19 Vaccine (5 - 2024-25 season) 10/03/2023   Hemoglobin A1C  04/15/2024   Mammogram  05/20/2024   Yearly kidney health urinalysis for diabetes  06/11/2024   Complete foot exam   06/11/2024   Eye exam for diabetics  07/24/2024   Yearly kidney function blood test for diabetes  10/15/2024   Medicare Annual Wellness Visit  12/05/2024   DTaP/Tdap/Td vaccine (2 - Td or Tdap) 03/12/2029   Colon Cancer Screening  11/22/2032   Pneumonia Vaccine  Completed   Flu Shot  Completed   DEXA scan (bone density measurement)  Completed   Hepatitis C Screening  Completed   Zoster (Shingles) Vaccine  Completed   HPV Vaccine  Aged Out    Advanced directives: (ACP Link)Information on Advanced Care Planning can be found at [facility] Advance Health Care Directives Advance Health Care Directives ([email or web address]   Next Medicare Annual Wellness Visit scheduled for next year: No, office will schedule  insert Preventive Care attachment Insert FALL PREVENTION attachment if needed

## 2023-12-07 ENCOUNTER — Encounter: Payer: Self-pay | Admitting: Internal Medicine

## 2023-12-10 ENCOUNTER — Encounter: Payer: Self-pay | Admitting: Hematology

## 2023-12-10 ENCOUNTER — Encounter: Payer: Self-pay | Admitting: Internal Medicine

## 2023-12-12 ENCOUNTER — Inpatient Hospital Stay: Payer: HMO | Attending: Nurse Practitioner

## 2023-12-12 ENCOUNTER — Inpatient Hospital Stay: Payer: HMO | Admitting: Nurse Practitioner

## 2023-12-12 ENCOUNTER — Encounter: Payer: Self-pay | Admitting: Nurse Practitioner

## 2023-12-12 VITALS — BP 118/68 | HR 76 | Temp 98.1°F | Resp 21 | Wt 161.4 lb

## 2023-12-12 DIAGNOSIS — D473 Essential (hemorrhagic) thrombocythemia: Secondary | ICD-10-CM | POA: Insufficient documentation

## 2023-12-12 DIAGNOSIS — D75839 Thrombocytosis, unspecified: Secondary | ICD-10-CM | POA: Diagnosis not present

## 2023-12-12 DIAGNOSIS — S40022A Contusion of left upper arm, initial encounter: Secondary | ICD-10-CM

## 2023-12-12 DIAGNOSIS — I252 Old myocardial infarction: Secondary | ICD-10-CM | POA: Insufficient documentation

## 2023-12-12 DIAGNOSIS — T148XXA Other injury of unspecified body region, initial encounter: Secondary | ICD-10-CM

## 2023-12-12 DIAGNOSIS — Z7982 Long term (current) use of aspirin: Secondary | ICD-10-CM | POA: Diagnosis not present

## 2023-12-12 LAB — CBC WITH DIFFERENTIAL (CANCER CENTER ONLY)
Abs Immature Granulocytes: 0.05 10*3/uL (ref 0.00–0.07)
Basophils Absolute: 0.1 10*3/uL (ref 0.0–0.1)
Basophils Relative: 1 %
Eosinophils Absolute: 0.2 10*3/uL (ref 0.0–0.5)
Eosinophils Relative: 2 %
HCT: 39.2 % (ref 36.0–46.0)
Hemoglobin: 12.8 g/dL (ref 12.0–15.0)
Immature Granulocytes: 1 %
Lymphocytes Relative: 18 %
Lymphs Abs: 1.5 10*3/uL (ref 0.7–4.0)
MCH: 29.9 pg (ref 26.0–34.0)
MCHC: 32.7 g/dL (ref 30.0–36.0)
MCV: 91.6 fL (ref 80.0–100.0)
Monocytes Absolute: 0.8 10*3/uL (ref 0.1–1.0)
Monocytes Relative: 9 %
Neutro Abs: 5.8 10*3/uL (ref 1.7–7.7)
Neutrophils Relative %: 69 %
Platelet Count: 452 10*3/uL — ABNORMAL HIGH (ref 150–400)
RBC: 4.28 MIL/uL (ref 3.87–5.11)
RDW: 13.7 % (ref 11.5–15.5)
WBC Count: 8.4 10*3/uL (ref 4.0–10.5)
nRBC: 0 % (ref 0.0–0.2)

## 2023-12-12 NOTE — Progress Notes (Cosign Needed)
Patient Care Team: Dorothyann Peng, MD as PCP - General (Internal Medicine) Croitoru, Rachelle Hora, MD as PCP - Cardiology (Cardiology) Harlan Stains, RPH (Inactive) (Pharmacist) Malachy Mood, MD as Consulting Physician (Hematology and Oncology) Bridgett Larsson, LCSW as VBCI Care Management (Licensed Clinical Social Worker) [facility] [place], [place], RN as VBCI Care Management   CHIEF COMPLAINT: Symptom management for bruising, in the setting of ET  CURRENT THERAPY: Anagrelide 1 mg daily since 01/2021  INTERVAL HISTORY Sara [name] presents for symptom management for bruising and pain of the left arm. Made a sudden arm movement to keep a shoe box from falling on her and heard a "pop" now with tenderness over the left upper arm and an enlarging bruise over most of the L arm. Denies h/o thrombosis. Denies cough, chest pain, dyspnea, or other bleeding. Takes baby aspirin since h/o MI in addition to anagrelide and other meds.   ROS  All other systems reviewed and negative  Past Medical History:  Diagnosis Date   Anxiety    Blood dyscrasia    Chronic kidney disease    Gout    Hyperlipemia    Hypertension    LVH (left ventricular hypertrophy)    12/21/09 Echo -mild asymmetric LVH,EF =>55%   Morbid obesity (HCC)    Myocardial infarction (HCC)    Nausea vomiting and diarrhea    OSA (obstructive sleep apnea)    PAD (peripheral artery disease) (HCC)    Pneumonia    Pre-diabetes    Renal artery stenosis (HCC) 12/13/2010   PTCA and Stent - right   Seizures (HCC)    Syncopal episodes    Thrombocytosis    Type 2 diabetes mellitus (HCC) 07/20/2013     Past Surgical History:  Procedure Laterality Date   BREAST CYST EXCISION     S/P Benign Right Breast Cyst Removal.   BREAST REDUCTION SURGERY  1995   S/P Bilateral breast reduction   CESAREAN SECTION  1980, 1986   Times  Two.   CORONARY ARTERY BYPASS GRAFT N/A 04/21/2017   Procedure: CORONARY ARTERY BYPASS  GRAFTING (CABG) x 2, USING LEFT MAMMARY ARTERY AND RIGHT GREATER SAPHENOUS VEIN HARVESTED ENDOSCOPICALLY;  Surgeon: Alleen Borne, MD;  Location: [facility] OR;  Service: Open Heart Surgery;  Laterality: N/A;   EXTREMITY CYST EXCISION  1993   left wrist   LEFT HEART CATH AND CORONARY ANGIOGRAPHY N/A 04/17/2017   Procedure: Left Heart Cath and Coronary Angiography;  Surgeon: Corky Crafts, MD;  Location: [facility] INVASIVE CV LAB;  Service: Cardiovascular;  Laterality: N/A;   PARTIAL HYSTERECTOMY  1994   renal artery stent placement Right    TEE WITHOUT CARDIOVERSION N/A 04/21/2017   Procedure: TRANSESOPHAGEAL ECHOCARDIOGRAM (TEE);  Surgeon: Alleen Borne, MD;  Location: [facility] OR;  Service: Open Heart Surgery;  Laterality: N/A;   VENTRAL HERNIA REPAIR N/A 06/17/2021   Procedure: OPEN VENTRAL HERNIA REPAIR WITH MESH;  Surgeon: Kinsinger, De Blanch, MD;  Location: [facility] ORS;  Service: General;  Laterality: N/A;     Outpatient Encounter Medications as of 12/12/2023  Medication Sig   amLODipine (NORVASC) 2.5 MG tablet Take 1 tablet (2.5 mg total) by mouth daily.   anagrelide ([name]) 1 MG capsule TAKE 1 CAPSULE(1 MG) BY MOUTH DAILY   aspirin EC 81 MG tablet Take 81 mg by mouth daily. Swallow whole.   atorvastatin (LIPITOR) 40 MG tablet Take 1 tablet (40 mg total) by mouth daily.   Cholecalciferol (  VITAMIN D3) 2000 UNITS capsule Take 2,000 Units by mouth daily.    hydrOXYzine (ATARAX) 10 MG tablet Take 10 mg by mouth 3 (three) times daily as needed.   Lacosamide 150 MG TABS TAKE 1 TABLET(150 MG) BY MOUTH TWICE DAILY   Magnesium 250 MG TABS Take 250 mg by mouth daily.   metoprolol tartrate (LOPRESSOR) 100 MG tablet Take 1 tablet (100 mg total) by mouth 2 (two) times daily.   Prenatal Vit-Fe Fumarate-FA (PRENATAL MULTIVITAMIN/IRON PO) Take by mouth.   telmisartan (MICARDIS) 80 MG tablet Take 1 tablet (80 mg total) by mouth daily.   No facility-administered encounter medications on file as of 12/12/2023.      Today's Vitals   12/12/23 1324  BP: 118/68  Pulse: 76  Resp: (!) 21  Temp: 98.1 F (36.7 C)  TempSrc: Temporal  SpO2: 99%  Weight: 161 lb 6.4 oz (73.2 kg)   Body mass index is 30.7 kg/m.   PHYSICAL EXAM GENERAL:alert, no distress and comfortable LUNGS: normal breathing effort NEURO: alert & oriented x 3 with fluent speech, no focal motor/sensory deficits MSK: LUE soft swelling and tenderness with diffuse ecchymosis/hematoma over the proximal arm. Normal temp/color, pulse and brisk cap refill      CBC    Component Value Date/Time   WBC 7.0 11/03/2023 1234   WBC 5.6 05/08/2023 1059   RBC 4.88 11/03/2023 1234   HGB 14.4 11/03/2023 1234   HGB 12.0 02/04/2021 1449   HGB 13.9 10/11/2017 1459   HCT 43.5 11/03/2023 1234   HCT 35.3 02/04/2021 1449   HCT 42.0 10/11/2017 1459   PLT 406 (H) 11/03/2023 1234   PLT 695 (H) 02/04/2021 1449   MCV 89.1 11/03/2023 1234   MCV 85 02/04/2021 1449   MCV 85.4 10/11/2017 1459   MCH 29.5 11/03/2023 1234   MCHC 33.1 11/03/2023 1234   RDW 13.0 11/03/2023 1234   RDW 13.8 02/04/2021 1449   RDW 15.6 (H) 10/11/2017 1459   LYMPHSABS 1.3 11/03/2023 1234   LYMPHSABS 1.6 02/04/2021 1449   LYMPHSABS 1.8 10/11/2017 1459   MONOABS 0.6 11/03/2023 1234   MONOABS 0.4 10/11/2017 1459   EOSABS 0.2 11/03/2023 1234   EOSABS 0.2 02/04/2021 1449   BASOSABS 0.1 11/03/2023 1234   BASOSABS 0.1 02/04/2021 1449   BASOSABS 0.0 10/11/2017 1459     CMP     Component Value Date/Time   NA 137 10/16/2023 1630   NA 139 08/11/2017 1528   K 5.0 10/16/2023 1630   K 4.2 08/11/2017 1528   CL 97 10/16/2023 1630   CL 102 03/19/2013 1535   CO2 26 10/16/2023 1630   CO2 27 08/11/2017 1528   GLUCOSE 136 (H) 10/16/2023 1630   GLUCOSE 96 03/04/2022 1447   GLUCOSE 93 08/11/2017 1528   GLUCOSE 117 (H) 03/19/2013 1535   BUN 27 10/16/2023 1630   BUN 15.0 08/11/2017 1528   CREATININE 1.30 (H) 10/16/2023 1630   CREATININE 1.26 (H) 03/04/2022 1447   CREATININE 1.1  08/11/2017 1528   CALCIUM 10.7 (H) 10/16/2023 1630   CALCIUM 9.2 08/11/2017 1528   PROT 6.3 06/12/2023 0941   PROT 7.0 08/11/2017 1528   ALBUMIN 3.7 (L) 06/12/2023 0941   ALBUMIN 3.6 08/11/2017 1528   AST 22 06/12/2023 0941   AST 20 03/04/2022 1447   AST 27 08/11/2017 1528   ALT 29 06/12/2023 0941   ALT 29 03/04/2022 1447   ALT 26 08/11/2017 1528   ALKPHOS 94 06/12/2023 0941   ALKPHOS  88 08/11/2017 1528   BILITOT 0.4 06/12/2023 0941   BILITOT 0.5 03/04/2022 1447   BILITOT 0.37 08/11/2017 1528   GFRNONAA 47 (L) 03/04/2022 1447   GFRAA 49 (L) 11/16/2020 1209     ASSESSMENT & PLAN: 68 yo female   Essential thrombocythemia, JAK2+ -diagnosed in 2010 with plt 881K -Bone marrow aspirate and biopsy on 01/20/09 was non-diagnostic. However, megakaryocytes were abundant with normal morphology. There was no clustering or excess blasts seen.  -started Hydrea in 2010, switched to anagrelide in 07/2016 due to concern about risk of leukemia. Currently on 1mg  daily, since 01/28/21, tolerating well with no noticeable side effects.  Hematoma  -She has a severe hematoma overtaking the entire proximal left arm past her elbow, with tenderness and swelling. Exam otherwise benign. No evidence of compartment syndrome -Soft tissue bleeding likely from aspirin. Will check plt count today -We reviewed symptom management in the future, including to hold aspirin temporarily with injury, ice in first 24 hours, then heat.      PLAN: -CBC pending -Continue anagrelide and aspirin, hold aspirin with future injury and symptom management -Future lab 01/2024 and lab/follow up in 04/2204 as scheduled  -Pt seen with Dr. Mosetta Putt   Orders Placed This Encounter  Procedures   CBC with Differential (Cancer Center Only)    Standing Status:   Future    Number of Occurrences:   1    Expiration Date:   12/11/2024      All questions were answered. The patient knows to call the clinic with any problems, questions or  concerns. No barriers to learning were detected.  Sara Glad, NP-C 12/12/23  Addendum  I have seen the patient, examined her. I agree with the assessment and and plan and have edited the notes.   Glena come in today with a large area of ecchymosis/hematoma in the left upper arm, secondary to a recent mild injury.  The area is soft, no concern for compartment syndrome.  This is probably related to her aspirin, the bleeding was 3 days ago, okay to resume aspirin.  Continue anagrelide.  All questions answered.  I spent a total of 15 minutes for her visit today, more than 50% time on face-to-face counseling.   Malachy Mood  12/12/2023

## 2023-12-13 ENCOUNTER — Encounter: Payer: Self-pay | Admitting: Nurse Practitioner

## 2023-12-18 ENCOUNTER — Encounter: Payer: Self-pay | Admitting: Nurse Practitioner

## 2023-12-18 ENCOUNTER — Ambulatory Visit (INDEPENDENT_AMBULATORY_CARE_PROVIDER_SITE_OTHER): Payer: PPO | Admitting: Nurse Practitioner

## 2023-12-18 VITALS — BP 130/60 | HR 85 | Temp 98.0°F | Ht 60.0 in | Wt 159.0 lb

## 2023-12-18 DIAGNOSIS — M79602 Pain in left arm: Secondary | ICD-10-CM | POA: Diagnosis not present

## 2023-12-18 DIAGNOSIS — T148XXA Other injury of unspecified body region, initial encounter: Secondary | ICD-10-CM | POA: Diagnosis not present

## 2023-12-18 NOTE — Assessment & Plan Note (Addendum)
 Healing hematoma/bruising, has mostly ecchymosis present to left forearm does not extend to elbow as mentioned before. Mild tenderness on palpation. This is likely related to her aspirin use and there is an increased risk in bruising with her anagrelide

## 2023-12-18 NOTE — Progress Notes (Signed)
 Madelaine Bhat, CMA,acting as a Neurosurgeon for Arnette Felts, FNP.,have documented all relevant documentation on the behalf of Arnette Felts, FNP,as directed by  Arnette Felts, FNP while in the presence of Arnette Felts, FNP.  Subjective:  Patient ID: Sara [name] , female    DOB: 1956-07-08 , 68 y.o.   MRN: [id number]  No chief complaint on file.   HPI  Patient presents today for swelling in her left arm since Saturday February 15th. While reaching for a box she felt like she pulled something and the box hit her in the arm. She does have some discomfort. Patient's left arm is very bruised. It is improving at this time. Patient reports she lifted up something and then it happened. Patient was seen by her hematologist last Tuesday and told her that it may be a popped blood vessel.      Past Medical History:  Diagnosis Date   Anxiety    Blood dyscrasia    Chronic kidney disease    Gout    Hyperlipemia    Hypertension    LVH (left ventricular hypertrophy)    12/21/09 Echo -mild asymmetric LVH,EF =>55%   Morbid obesity (HCC)    Myocardial infarction (HCC)    Nausea vomiting and diarrhea    OSA (obstructive sleep apnea)    PAD (peripheral artery disease) (HCC)    Pneumonia    Pre-diabetes    Renal artery stenosis (HCC) 12/13/2010   PTCA and Stent - right   Seizures (HCC)    Syncopal episodes    Thrombocytosis    Type 2 diabetes mellitus (HCC) 07/20/2013     Family History  Problem Relation Age of Onset   Heart failure Mother    Diabetes Father    Diabetes Sister    Hypertension Sister    Hypertension Sister    Hypertension Sister    Breast cancer Maternal Aunt        2 maternal aunts had breast cancer.   Breast cancer Maternal Grandmother    Sleep apnea Neg Hx      Current Outpatient Medications:    amLODipine (NORVASC) 2.5 MG tablet, Take 1 tablet (2.5 mg total) by mouth daily., Disp: 90 tablet, Rfl: 2   anagrelide ([name]) 1 MG capsule, TAKE 1 CAPSULE(1 MG) BY MOUTH  DAILY, Disp: 30 capsule, Rfl: 5   aspirin EC 81 MG tablet, Take 81 mg by mouth daily. Swallow whole., Disp: , Rfl:    atorvastatin (LIPITOR) 40 MG tablet, Take 1 tablet (40 mg total) by mouth daily., Disp: 90 tablet, Rfl: 3   Cholecalciferol (VITAMIN D3) 2000 UNITS capsule, Take 2,000 Units by mouth daily. , Disp: , Rfl:    hydrOXYzine (ATARAX) 10 MG tablet, Take 10 mg by mouth 3 (three) times daily as needed., Disp: , Rfl:    Lacosamide 150 MG TABS, TAKE 1 TABLET(150 MG) BY MOUTH TWICE DAILY, Disp: 60 tablet, Rfl: 1   Magnesium 250 MG TABS, Take 250 mg by mouth daily., Disp: , Rfl:    metoprolol tartrate (LOPRESSOR) 100 MG tablet, Take 1 tablet (100 mg total) by mouth 2 (two) times daily., Disp: 180 tablet, Rfl: 3   Prenatal Vit-Fe Fumarate-FA (PRENATAL MULTIVITAMIN/IRON PO), Take by mouth., Disp: , Rfl:    telmisartan (MICARDIS) 80 MG tablet, Take 1 tablet (80 mg total) by mouth daily., Disp: 90 tablet, Rfl: 2   Allergies  Allergen Reactions   Sulfa Antibiotics Itching     Review of Systems  Constitutional: Negative.  Respiratory: Negative.  Negative for shortness of breath.   Cardiovascular: Negative.  Negative for chest pain and palpitations.  Gastrointestinal: Negative.   Neurological: Negative.      Today's Vitals   12/18/23 0827  BP: 130/60  Pulse: 85  Temp: 98 F (36.7 C)  TempSrc: Oral  Weight: 159 lb (72.1 kg)  Height: 5' (1.524 m)  PainSc: 8   PainLoc: Arm   Body mass index is 31.05 kg/m.  Wt Readings from Last 3 Encounters:  12/18/23 159 lb (72.1 kg)  12/12/23 161 lb 6.4 oz (73.2 kg)  11/03/23 156 lb 9.6 oz (71 kg)    The ASCVD Risk score (Arnett DK, et al., 2019) failed to calculate for the following reasons:   Risk score cannot be calculated because patient has a medical history suggesting prior/existing ASCVD  Objective:  Physical Exam Vitals and nursing note reviewed.  Constitutional:      General: She is not in acute distress.    Appearance: Normal  appearance. She is obese.  HENT:     Head: Normocephalic and atraumatic.  Eyes:     Extraocular Movements: Extraocular movements intact.  Cardiovascular:     Rate and Rhythm: Normal rate and regular rhythm.     Heart sounds: Normal heart sounds.  Pulmonary:     Effort: Pulmonary effort is normal. No respiratory distress.     Breath sounds: Normal breath sounds.  Skin:    General: Skin is warm.     Findings: Bruising (left forearm with purple bruising with some tenderness on palpation) present.  Neurological:     General: No focal deficit present.     Mental Status: She is alert and oriented to person, place, and time.  Psychiatric:        Mood and Affect: Mood normal.        Behavior: Behavior normal.        Thought Content: Thought content normal.        Judgment: Judgment normal.      Assessment And Plan:  Left arm pain Assessment & Plan: Slight tenderness on palpation to forearm   Hematoma Assessment & Plan: Healing hematoma/bruising, has mostly ecchymosis present to left forearm does not extend to elbow as mentioned before. Mild tenderness on palpation. This is likely related to her aspirin use and there is an increased risk in bruising with her anagrelide     No follow-ups on file.  Patient was given opportunity to ask questions. Patient verbalized understanding of the plan and was able to repeat key elements of the plan. All questions were answered to their satisfaction.    Jeanell Sparrow, FNP, have reviewed all documentation for this visit. The documentation on 12/18/23 for the exam, diagnosis, procedures, and orders are all accurate and complete.   IF YOU HAVE BEEN REFERRED TO A SPECIALIST, IT MAY TAKE 1-2 WEEKS TO SCHEDULE/PROCESS THE REFERRAL. IF YOU HAVE NOT HEARD FROM US/SPECIALIST IN TWO WEEKS, PLEASE GIVE [place] A CALL AT [phone].

## 2023-12-18 NOTE — Assessment & Plan Note (Signed)
 Slight tenderness on palpation to forearm

## 2023-12-20 DIAGNOSIS — H2512 Age-related nuclear cataract, left eye: Secondary | ICD-10-CM | POA: Diagnosis not present

## 2023-12-21 ENCOUNTER — Other Ambulatory Visit: Payer: Self-pay | Admitting: Internal Medicine

## 2023-12-21 ENCOUNTER — Other Ambulatory Visit: Payer: Self-pay | Admitting: Adult Health

## 2023-12-21 ENCOUNTER — Ambulatory Visit: Payer: Self-pay | Admitting: Licensed Clinical Social Worker

## 2023-12-21 DIAGNOSIS — R569 Unspecified convulsions: Secondary | ICD-10-CM

## 2023-12-21 DIAGNOSIS — I130 Hypertensive heart and chronic kidney disease with heart failure and stage 1 through stage 4 chronic kidney disease, or unspecified chronic kidney disease: Secondary | ICD-10-CM

## 2023-12-21 DIAGNOSIS — H2511 Age-related nuclear cataract, right eye: Secondary | ICD-10-CM | POA: Diagnosis not present

## 2023-12-22 NOTE — Patient Instructions (Signed)
 Visit Information  Thank you for taking time to visit with me today. Please don't hesitate to contact me if I can be of assistance to you.   Following are the goals we discussed today:   Goals Addressed             This Visit's Progress    LCSW-Strengthen Support   On track    Activities and task to complete in order to accomplish goals.   Keep all upcoming appointments discussed today Continue with compliance of taking medication prescribed by Doctor Implement healthy coping skills discussed to assist with management of symptoms Follow up with community agencies regarding goals for son Solution-Focused Strategies employed:  Active listening / Reflection utilized  Emotional Support Provided Patient identified stressors that negatively impact mental health Validation and encouragement provided Patient is motivated to continue with management of health conditions Healthy coping skills discussed         Our next appointment is by telephone on 4/10 at 11:30 AM  Please call the care guide team at [phone] if you need to cancel or reschedule your appointment.   If you are experiencing a Mental Health or Behavioral Health Crisis or need someone to talk to, please call the Suicide and Crisis Lifeline: 988 call 911   Patient verbalizes understanding of instructions and care plan provided today and agrees to view in MyChart. Active MyChart status and patient understanding of how to access instructions and care plan via MyChart confirmed with patient.     Windy Fast [facility]  [facility], [facility] Clinical Social Worker Direct Dial: [phone]  Fax: [phone] Website: [email or web address] 4:55 PM]

## 2023-12-22 NOTE — Patient Outreach (Signed)
 Care Coordination   Follow Up Visit Note   12/21/2023 Name: Sara [name] MRN: [id number] DOB: 02-Oct-1956  Sara [name] is a 68 y.o. year old female who sees Dorothyann Peng, MD for primary care. I spoke with  Sara [name] by phone today.  What matters to the patients health and wellness today?  Symptom Management, Resources    Goals Addressed             This Visit's Progress    LCSW-Strengthen Support   On track    Activities and task to complete in order to accomplish goals.   Keep all upcoming appointments discussed today Continue with compliance of taking medication prescribed by Doctor Implement healthy coping skills discussed to assist with management of symptoms Follow up with community agencies regarding goals for son Solution-Focused Strategies employed:  Active listening / Reflection utilized  Emotional Support Provided Patient identified stressors that negatively impact mental health Validation and encouragement provided Patient is motivated to continue with management of health conditions Healthy coping skills discussed         SDOH assessments and interventions completed:  No     Care Coordination Interventions:  Yes, provided  Interventions Today    Flowsheet Row Most Recent Value  Chronic Disease   Chronic disease during today's visit Hypertension (HTN), Diabetes, Congestive Heart Failure (CHF), Chronic Kidney Disease/End Stage Renal Disease (ESRD)  General Interventions   General Interventions Discussed/Reviewed General Interventions Reviewed, Doctor Visits  [Pt's arm is decreasing in bruising and soreness. Spouse continues to provide transporation to medical appts]  Doctor Visits Discussed/Reviewed Doctor Visits Reviewed, Specialist  [Pt completed cataract procedure for left eye. She visited Dr. Carolan Shiver today, received a good report. Surgery for right eye scheduled 3/12]  Mental Health Interventions   Mental Health Discussed/Reviewed Mental  Health Reviewed, Coping Strategies  [Pt continues to work with [facility] for son to assist with his housing needs. LCSW will continue to look into additional resources to assist with caregiver stress]  Nutrition Interventions   Nutrition Discussed/Reviewed Nutrition Reviewed  Pharmacy Interventions   Pharmacy Dicussed/Reviewed Pharmacy Topics Reviewed, Medication Adherence  Safety Interventions   Safety Discussed/Reviewed Safety Reviewed       Follow up plan: Follow up call scheduled for 4-6 sweeks    Encounter Outcome:  Patient Visit Completed   Jenel Lucks, LCSW [place]  [facility], [facility] Clinical Social Worker Direct Dial: [phone]  Fax: [phone] Website: [email or web address] 4:51 PM

## 2023-12-23 DIAGNOSIS — F411 Generalized anxiety disorder: Secondary | ICD-10-CM | POA: Diagnosis not present

## 2023-12-23 DIAGNOSIS — F331 Major depressive disorder, recurrent, moderate: Secondary | ICD-10-CM | POA: Diagnosis not present

## 2023-12-27 ENCOUNTER — Ambulatory Visit: Payer: Self-pay

## 2023-12-28 ENCOUNTER — Other Ambulatory Visit: Payer: Self-pay | Admitting: Internal Medicine

## 2023-12-28 NOTE — Telephone Encounter (Signed)
 Copied from CRM [id number]. Topic: Clinical - Medication Refill >> Dec 28, 2023  2:36 PM Fuller Mandril wrote: Most Recent Primary Care Visit:  Provider: Arnette Felts  Department: [facility]  Visit Type: OFFICE VISIT  Date: 12/18/2023  Medication: Glucose test strips for machine that was giving to her in office - she came to pick up last week would like Rx for strips. Thinks is contour but does not have machine at time of call.   Has the patient contacted their pharmacy? Yes (Agent: If no, request that the patient contact the pharmacy for the refill. If patient does not wish to contact the pharmacy document the reason why and proceed with request.) (Agent: If yes, when and what did the pharmacy advise?) told to call provider for rx  Is this the correct pharmacy for this prescription? Yes If no, delete pharmacy and type the correct one.  This is the patient's preferred pharmacy:  [facility] [address] [place] - [address] AT [address] [place] [address] Phone: [phone] Fax: [phone]   Has the prescription been filled recently? N/A  Is the patient out of the medication? N/A  Has the patient been seen for an appointment in the last year OR does the patient have an upcoming appointment? Yes  Can we respond through MyChart? Yes  Agent: Please be advised that Rx refills may take up to 3 business days. We ask that you follow-up with your pharmacy.

## 2023-12-28 NOTE — Patient Instructions (Signed)
 Visit Information  Thank you for taking time to visit with me today. Please don't hesitate to contact me if I can be of assistance to you.   Following are the goals we discussed today:   Goals Addressed             This Visit's Progress    COMPLETED: To undergo cataract removal without complications       Care Coordination Interventions: Evaluation of current treatment plan related to left eye cataract removal  and patient's adherence to plan as established by provider Reviewed and discussed with patient she is recovering well following cataract removal completed on 12/20/23 Determined patient has completed her post operative exam, she reports adherence to using her prescription medication drops and will resume driving when she is ready            Our next appointment is by telephone on 01/31/24 at 2:00 PM  Please call the care guide team at [phone] if you need to cancel or reschedule your appointment.   If you are experiencing a Mental Health or Behavioral Health Crisis or need someone to talk to, please call 1-800-273-TALK (toll free, 24 hour hotline)  Patient verbalizes understanding of instructions and care plan provided today and agrees to view in MyChart. Active MyChart status and patient understanding of how to access instructions and care plan via MyChart confirmed with patient.     Delsa Sale RN BSN CCM [place] [facility]  [facility], [facility] Health Nurse Care Coordinator  Direct Dial: [phone] Website: [email or web address]

## 2023-12-28 NOTE — Patient Outreach (Signed)
 Care Coordination   Follow Up Visit Note   12/27/2023 Name: Sara [name] MRN: [id number] DOB: [birth year]  Sara [name] is a 68 y.o. year old female who sees Dorothyann Peng, MD for primary care. I spoke with  Sara [name] by phone today.  What matters to the patients health and wellness today?  Patient would like to resume driving following her cataract surgery.     Goals Addressed             This Visit's Progress    COMPLETED: To undergo cataract removal without complications       Care Coordination Interventions: Evaluation of current treatment plan related to left eye cataract removal  and patient's adherence to plan as established by provider Reviewed and discussed with patient she is recovering well following cataract removal completed on 12/20/23 Determined patient has completed her post operative exam, she reports adherence to using her prescription medication drops and will resume driving when she is ready     Interventions Today    Flowsheet Row Most Recent Value  Chronic Disease   Chronic disease during today's visit Other  [s/p Cataract surgery,  grief,  arm pain w/hematoma]  General Interventions   General Interventions Discussed/Reviewed General Interventions Discussed, General Interventions Reviewed, Doctor Visits  Doctor Visits Discussed/Reviewed Doctor Visits Discussed, Doctor Visits Reviewed, PCP  Education Interventions   Education Provided Provided Education  Provided Verbal Education On When to see the doctor, Mental Health/Coping with Illness, Medication, Eye Care  Mental Health Interventions   Mental Health Discussed/Reviewed Mental Health Discussed, Mental Health Reviewed, Grief and Loss, Depression, Anxiety, Coping Strategies  Pharmacy Interventions   Pharmacy Dicussed/Reviewed Pharmacy Topics Reviewed, Pharmacy Topics Discussed, Medications and their functions          SDOH assessments and interventions completed:  No     Care  Coordination Interventions:  Yes, provided   Follow up plan: Follow up call scheduled for 01/31/24 @2 :00 PM    Encounter Outcome:  Patient Visit Completed

## 2023-12-29 MED ORDER — CONTOUR TEST VI STRP: ORAL_STRIP | 1 refills | Status: DC

## 2024-01-01 ENCOUNTER — Other Ambulatory Visit: Payer: Self-pay

## 2024-01-01 DIAGNOSIS — N1831 Chronic kidney disease, stage 3a: Secondary | ICD-10-CM

## 2024-01-01 MED ORDER — CONTOUR TEST VI STRP: ORAL_STRIP | 1 refills | Status: DC

## 2024-01-03 DIAGNOSIS — H2511 Age-related nuclear cataract, right eye: Secondary | ICD-10-CM | POA: Diagnosis not present

## 2024-01-29 ENCOUNTER — Encounter: Payer: Self-pay | Admitting: Internal Medicine

## 2024-01-29 ENCOUNTER — Ambulatory Visit: Payer: Medicare PPO | Admitting: Internal Medicine

## 2024-01-29 VITALS — BP 150/82 | HR 65 | Temp 98.6°F | Ht 60.0 in | Wt 156.0 lb

## 2024-01-29 DIAGNOSIS — E1122 Type 2 diabetes mellitus with diabetic chronic kidney disease: Secondary | ICD-10-CM

## 2024-01-29 DIAGNOSIS — Z6830 Body mass index (BMI) 30.0-30.9, adult: Secondary | ICD-10-CM

## 2024-01-29 DIAGNOSIS — J302 Other seasonal allergic rhinitis: Secondary | ICD-10-CM

## 2024-01-29 DIAGNOSIS — E66811 Obesity, class 1: Secondary | ICD-10-CM | POA: Diagnosis not present

## 2024-01-29 DIAGNOSIS — N1831 Chronic kidney disease, stage 3a: Secondary | ICD-10-CM

## 2024-01-29 DIAGNOSIS — Z636 Dependent relative needing care at home: Secondary | ICD-10-CM

## 2024-01-29 DIAGNOSIS — F439 Reaction to severe stress, unspecified: Secondary | ICD-10-CM | POA: Insufficient documentation

## 2024-01-29 DIAGNOSIS — E6609 Other obesity due to excess calories: Secondary | ICD-10-CM

## 2024-01-29 DIAGNOSIS — I130 Hypertensive heart and chronic kidney disease with heart failure and stage 1 through stage 4 chronic kidney disease, or unspecified chronic kidney disease: Secondary | ICD-10-CM

## 2024-01-29 DIAGNOSIS — I5032 Chronic diastolic (congestive) heart failure: Secondary | ICD-10-CM

## 2024-01-29 MED ORDER — TELMISARTAN 80 MG PO TABS: 80.0000 mg | ORAL_TABLET | Freq: Every day | ORAL | 2 refills | Status: DC

## 2024-01-29 MED ORDER — AMLODIPINE BESYLATE 2.5 MG PO TABS: 2.5000 mg | ORAL_TABLET | Freq: Every day | ORAL | 2 refills | Status: AC

## 2024-01-29 NOTE — Patient Instructions (Addendum)
 Jardiance Farxiga  Diabetes Mellitus and Nutrition, Adult When you have diabetes, or diabetes mellitus, it is very important to have healthy eating habits because your blood sugar (glucose) levels are greatly affected by what you eat and drink. Eating healthy foods in the right amounts, at about the same times every day, can help you: Manage your blood glucose. Lower your risk of heart disease. Improve your blood pressure. Reach or maintain a healthy weight. What can affect my meal plan? Every person with diabetes is different, and each person has different needs for a meal plan. Your health care provider may recommend that you work with a dietitian to make a meal plan that is best for you. Your meal plan may vary depending on factors such as: The calories you need. The medicines you take. Your weight. Your blood glucose, blood pressure, and cholesterol levels. Your activity level. Other health conditions you have, such as heart or kidney disease. How do carbohydrates affect me? Carbohydrates, also called carbs, affect your blood glucose level more than any other type of food. Eating carbs raises the amount of glucose in your blood. It is important to know how many carbs you can safely have in each meal. This is different for every person. Your dietitian can help you calculate how many carbs you should have at each meal and for each snack. How does alcohol affect me? Alcohol can cause a decrease in blood glucose (hypoglycemia), especially if you use insulin or take certain diabetes medicines by mouth. Hypoglycemia can be a life-threatening condition. Symptoms of hypoglycemia, such as sleepiness, dizziness, and confusion, are similar to symptoms of having too much alcohol. Do not drink alcohol if: Your health care provider tells you not to drink. You are pregnant, may be pregnant, or are planning to become pregnant. If you drink alcohol: Limit how much you have to: 0-1 drink a day for  women. 0-2 drinks a day for men. Know how much alcohol is in your drink. In the [place] one drink equals one 12 oz bottle of beer (355 mL), one 5 oz glass of wine (148 mL), or one 1 oz glass of hard liquor (44 mL). Keep yourself hydrated with water, diet soda, or unsweetened iced tea. Keep in mind that regular soda, juice, and other mixers may contain a lot of sugar and must be counted as carbs. What are tips for following this plan?  Reading food labels Start by checking the serving size on the Nutrition Facts label of packaged foods and drinks. The number of calories and the amount of carbs, fats, and other nutrients listed on the label are based on one serving of the item. Many items contain more than one serving per package. Check the total grams (g) of carbs in one serving. Check the number of grams of saturated fats and trans fats in one serving. Choose foods that have a low amount or none of these fats. Check the number of milligrams (mg) of salt (sodium) in one serving. Most people should limit total sodium intake to less than 2,300 mg per day. Always check the nutrition information of foods labeled as "low-fat" or "nonfat." These foods may be higher in added sugar or refined carbs and should be avoided. Talk to your dietitian to identify your daily goals for nutrients listed on the label. Shopping Avoid buying canned, pre-made, or processed foods. These foods tend to be high in fat, sodium, and added sugar. Shop around the outside edge of the grocery store. This  is where you will most often find fresh fruits and vegetables, bulk grains, fresh meats, and fresh dairy products. Cooking Use low-heat cooking methods, such as baking, instead of high-heat cooking methods, such as deep frying. Cook using healthy oils, such as olive, canola, or sunflower oil. Avoid cooking with butter, cream, or high-fat meats. Meal planning Eat meals and snacks regularly, preferably at the same times every day.  Avoid going long periods of time without eating. Eat foods that are high in fiber, such as fresh fruits, vegetables, beans, and whole grains. Eat 4-6 oz (112-168 g) of lean protein each day, such as lean meat, chicken, fish, eggs, or tofu. One ounce (oz) (28 g) of lean protein is equal to: 1 oz (28 g) of meat, chicken, or fish. 1 egg.  cup (62 g) of tofu. Eat some foods each day that contain healthy fats, such as avocado, nuts, seeds, and fish. What foods should I eat? Fruits Berries. Apples. Oranges. Peaches. Apricots. Plums. Grapes. Mangoes. Papayas. Pomegranates. Kiwi. Cherries. Vegetables Leafy greens, including lettuce, spinach, kale, chard, collard greens, mustard greens, and cabbage. Beets. Cauliflower. Broccoli. Carrots. Green beans. Tomatoes. Peppers. Onions. Cucumbers. Brussels sprouts. Grains Whole grains, such as whole-wheat or whole-grain bread, crackers, tortillas, cereal, and pasta. Unsweetened oatmeal. Quinoa. Brown or wild rice. Meats and other proteins Seafood. Poultry without skin. Lean cuts of poultry and beef. Tofu. Nuts. Seeds. Dairy Low-fat or fat-free dairy products such as milk, yogurt, and cheese. The items listed above may not be a complete list of foods and beverages you can eat and drink. Contact a dietitian for more information. What foods should I avoid? Fruits Fruits canned with syrup. Vegetables Canned vegetables. Frozen vegetables with butter or cream sauce. Grains Refined white flour and flour products such as bread, pasta, snack foods, and cereals. Avoid all processed foods. Meats and other proteins Fatty cuts of meat. Poultry with skin. Breaded or fried meats. Processed meat. Avoid saturated fats. Dairy Full-fat yogurt, cheese, or milk. Beverages Sweetened drinks, such as soda or iced tea. The items listed above may not be a complete list of foods and beverages you should avoid. Contact a dietitian for more information. Questions to ask a health  care provider Do I need to meet with a certified diabetes care and education specialist? Do I need to meet with a dietitian? What number can I call if I have questions? When are the best times to check my blood glucose? Where to find more information: American Diabetes Association: diabetes.org Academy of Nutrition and Dietetics: eatright.[employer] of Diabetes and Digestive and Kidney Diseases: StageSync.si Association of Diabetes Care & Education Specialists: diabeteseducator.org Summary It is important to have healthy eating habits because your blood sugar (glucose) levels are greatly affected by what you eat and drink. It is important to use alcohol carefully. A healthy meal plan will help you manage your blood glucose and lower your risk of heart disease. Your health care provider may recommend that you work with a dietitian to make a meal plan that is best for you. This information is not intended to replace advice given to you by your health care provider. Make sure you discuss any questions you have with your health care provider. Document Revised: 05/12/2020 Document Reviewed: 05/13/2020 Elsevier Patient Education  2024 [employer].

## 2024-01-29 NOTE — Assessment & Plan Note (Signed)
Her BMI is acceptable for his demographic. She is encouraged to aim for at least 150 minutes of exercise/week.

## 2024-01-29 NOTE — Assessment & Plan Note (Addendum)
 She is the primary caregiver of her son who has mental illness. She is also followed by Psychiatry, prescribed hydroxyzine to take as needed for anxiety. She also cares for her sister who lives in [place], adds her husband has recent illness as well.

## 2024-01-29 NOTE — Progress Notes (Addendum)
 I,[name], CMA,acting as a Neurosurgeon for Smiley Dung, MD.,have documented all relevant documentation on the behalf of Smiley Dung, MD,as directed by  Smiley Dung, MD while in the presence of Smiley Dung, MD.  Subjective:  Patient ID: Sara [name] , female    DOB: [birth year] , 68 y.o.   MRN: [id number]  Chief Complaint  Patient presents with   Hypertension   Diabetes    Patient presents today for diabetes check. She reports compliance with her meds. Patient denies having chest pain, sob or headaches.     HPI  Discussed the use of AI scribe software for clinical note transcription with the patient, who gave verbal consent to proceed.  History of Present Illness The patient presents for a routine check of diabetes and blood pressure.  Her blood pressure is elevated, which she attributes to recent stressors involving her family. She has not been checking her blood pressure at home as often as she should.  She is currently taking telmisartan every morning between 8:00 and 8:30 AM, amlodipine at night, atorvastatin, aspirin, and metoprolol (a full tablet in the morning and evening). She also takes lacosamide twice daily, magnesium at night, and [name] as prescribed by her hematologist. Hydroxyzine is prescribed by her psychiatrist, but she has not been taking it regularly.  She is experiencing stress due to her son's behavioral issues and his housing instability, which she believes is contributing to her elevated blood pressure. She has been actively seeking help for her son through behavioral health services and job fairs, but with limited success.  She describes a busy lifestyle, including regular church activities and caring for her family. She tries to walk for at least 30 minutes a day and reports eating more than one meal a day, typically starting with yogurt and strawberries or oatmeal with apples in the morning, followed by a light meal in the afternoon and a  snack before bed.    Diabetes She presents for her follow-up diabetic visit. She has type 2 diabetes mellitus. Her disease course has been stable. There are no hypoglycemic associated symptoms. There are no diabetic associated symptoms. Pertinent negatives for diabetes include no blurred vision and no chest pain. There are no hypoglycemic complications. Diabetic complications include heart disease and nephropathy. Risk factors for coronary artery disease include diabetes mellitus, dyslipidemia, hypertension, obesity, post-menopausal and sedentary lifestyle. She is compliant with treatment most of the time. She is following a diabetic diet. She participates in exercise three times a week. Her breakfast blood glucose is taken between 7-8 am. Her breakfast blood glucose range is generally 90-110 mg/dl. An ACE inhibitor/angiotensin II receptor blocker is being taken. Eye exam is current.  Hypertension This is a chronic problem. The current episode started more than 1 year ago. The problem has been gradually improving since onset. The problem is uncontrolled. Pertinent negatives include no blurred vision, chest pain, palpitations or shortness of breath. The current treatment provides moderate improvement. Compliance problems include exercise.  Hypertensive end-organ damage includes CAD/MI.     Past Medical History:  Diagnosis Date   Anxiety    Blood dyscrasia    Chronic kidney disease    Gout    Hyperlipemia    Hypertension    LVH (left ventricular hypertrophy)    12/21/09 Echo -mild asymmetric LVH,EF =>55%   Morbid obesity (HCC)    Myocardial infarction (HCC)    Nausea vomiting and diarrhea    OSA (obstructive sleep apnea)  PAD (peripheral artery disease) (HCC)    Pneumonia    Pre-diabetes    Renal artery stenosis (HCC) 12/13/2010   PTCA and Stent - right   Seizures (HCC)    Syncopal episodes    Thrombocytosis    Type 2 diabetes mellitus (HCC) 07/20/2013     Family History  Problem  Relation Age of Onset   Heart failure Mother    Diabetes Father    Diabetes Sister    Hypertension Sister    Hypertension Sister    Hypertension Sister    Breast cancer Maternal Aunt        2 maternal aunts had breast cancer.   Breast cancer Maternal Grandmother    Sleep apnea Neg Hx      Current Outpatient Medications:    anagrelide ([name]) 1 MG capsule, TAKE 1 CAPSULE(1 MG) BY MOUTH DAILY, Disp: 30 capsule, Rfl: 5   aspirin EC 81 MG tablet, Take 81 mg by mouth daily. Swallow whole., Disp: , Rfl:    atorvastatin (LIPITOR) 40 MG tablet, Take 1 tablet (40 mg total) by mouth daily., Disp: 90 tablet, Rfl: 3   Cholecalciferol (VITAMIN D3) 2000 UNITS capsule, Take 2,000 Units by mouth daily. , Disp: , Rfl:    glucose blood (CONTOUR TEST) test strip, Use once daily to check blood sugars., Disp: 100 each, Rfl: 1   Lacosamide 150 MG TABS, TAKE 1 TABLET BY MOUTH TWICE DAILY, Disp: 60 tablet, Rfl: 4   Magnesium 250 MG TABS, Take 250 mg by mouth 2 (two) times daily., Disp: , Rfl:    metoprolol tartrate (LOPRESSOR) 100 MG tablet, TAKE 1 TABLET(100 MG) BY MOUTH TWICE DAILY, Disp: 180 tablet, Rfl: 3   Prenatal Vit-Fe Fumarate-FA (PRENATAL MULTIVITAMIN/IRON PO), Take by mouth., Disp: , Rfl:    amLODipine (NORVASC) 2.5 MG tablet, Take 1 tablet (2.5 mg total) by mouth daily., Disp: 90 tablet, Rfl: 2   hydrOXYzine (ATARAX) 10 MG tablet, Take 10 mg by mouth 3 (three) times daily as needed. (Patient not taking: Reported on 01/30/2024), Disp: , Rfl:    telmisartan (MICARDIS) 80 MG tablet, Take 1 tablet (80 mg total) by mouth daily., Disp: 90 tablet, Rfl: 2   Allergies  Allergen Reactions   Allegra Allergy [Fexofenadine Hcl] Other (See Comments)    Generally not feeling well.    Sulfa Antibiotics Itching     Review of Systems  Constitutional: Negative.   HENT:  Positive for postnasal drip.        She admits the pollen has been aggravating her.   Eyes:  Negative for blurred vision.  Respiratory:  Negative.  Negative for shortness of breath.   Cardiovascular: Negative.  Negative for chest pain and palpitations.  Gastrointestinal: Negative.   Neurological: Negative.   Psychiatric/Behavioral: Negative.       Today's Vitals   01/29/24 1206 01/29/24 1229  BP: (!) 160/70 (!) 150/82  Pulse: 65   Temp: 98.6 F (37 C)   TempSrc: Oral   Weight: 156 lb (70.8 kg)   Height: 5' (1.524 m)   PainSc: 0-No pain    Body mass index is 30.47 kg/m.  Wt Readings from Last 3 Encounters:  01/29/24 156 lb (70.8 kg)  12/18/23 159 lb (72.1 kg)  12/12/23 161 lb 6.4 oz (73.2 kg)    BP Readings from Last 3 Encounters:  01/29/24 (!) 150/82  12/18/23 130/60  12/12/23 118/68   Objective:  Physical Exam Vitals and nursing note reviewed.  Constitutional:  Appearance: Normal appearance.  HENT:     Head: Normocephalic and atraumatic.  Eyes:     Extraocular Movements: Extraocular movements intact.  Cardiovascular:     Rate and Rhythm: Normal rate and regular rhythm.     Heart sounds: Normal heart sounds.  Pulmonary:     Effort: Pulmonary effort is normal.     Breath sounds: Normal breath sounds.  Musculoskeletal:     Cervical back: Normal range of motion.  Skin:    General: Skin is warm.  Neurological:     General: No focal deficit present.     Mental Status: She is alert.  Psychiatric:        Mood and Affect: Mood normal.        Behavior: Behavior normal.      Assessment And Plan:  Type 2 diabetes mellitus with stage 3a chronic kidney disease, without long-term current use of insulin (HCC) Assessment & Plan: Diabetes well-controlled without medication. Discussed potential future medication need if A1c rises above 7 or for cardiovascular and renal protection. - Include information on Farxiga and Jardiance in after visit summary. - Monitor A1c levels.  Orders: -     amLODIPine Besylate; Take 1 tablet (2.5 mg total) by mouth daily.  Dispense: 90 tablet; Refill: 2 -      CMP14+EGFR -     Hemoglobin A1c  Hypertensive heart and renal disease with renal failure, stage 1 through stage 4 or unspecified chronic kidney disease, with heart failure (HCC) Assessment & Plan: Chronic, uncontrolled. Goal BP<120/80.  She admits to having a stressful day. She will continue with metoprolol tartrate 100mg  twice daily, amlodipine 2.5mg  and telmisartan 80mg  daily. She is encouraged to follow low sodium diet. Blood pressure elevated at 150/82 mmHg. Stress may contribute. Adherent to medication but inconsistent dietary intake and lacks regular home monitoring. - Recheck blood pressure in office in three weeks during nurse visit. - Encourage home blood pressure monitoring. - I plan to increase dose of amlodipine IF her blood pressure remains high.   Orders: -     Telmisartan; Take 1 tablet (80 mg total) by mouth daily.  Dispense: 90 tablet; Refill: 2  Seasonal allergies Assessment & Plan: She was given sample of Zyrtec 10mg  daily.  She can try 1/2 tablet first to see if she develops drowsiness.  Advised to take nightly prn.    Caregiver stress Assessment & Plan: She is the primary caregiver of her son who has mental illness. She is also followed by Psychiatry, prescribed hydroxyzine to take as needed for anxiety. She also cares for her sister who lives in [place], adds her husband has recent illness as well.    Class 1 obesity due to excess calories with serious comorbidity and body mass index (BMI) of 30.0 to 30.9 in adult Assessment & Plan: Her BMI is acceptable for his demographic. She is encouraged to aim for at least 150 minutes of exercise/week.    Return in 3 weeks (on 02/19/2024), or [place] - bp check.   Patient was given opportunity to ask questions. Patient verbalized understanding of the plan and was able to repeat key elements of the plan. All questions were answered to their satisfaction.    I, [name], MD, have reviewed all documentation for this visit. The  documentation on 01/29/24 for the exam, diagnosis, procedures, and orders are all accurate and complete.   IF YOU HAVE BEEN REFERRED TO A SPECIALIST, IT MAY TAKE 1-2 WEEKS TO SCHEDULE/PROCESS THE REFERRAL.  IF YOU HAVE NOT HEARD FROM US /SPECIALIST IN TWO WEEKS, PLEASE GIVE US  A CALL AT [phone].

## 2024-01-30 ENCOUNTER — Ambulatory Visit: Payer: Self-pay

## 2024-01-30 LAB — CMP14+EGFR
ALT: 33 IU/L — ABNORMAL HIGH (ref 0–32)
AST: 28 IU/L (ref 0–40)
Albumin: 4.1 g/dL (ref 3.9–4.9)
Alkaline Phosphatase: 106 IU/L (ref 44–121)
BUN/Creatinine Ratio: 15 (ref 12–28)
BUN: 20 mg/dL (ref 8–27)
Bilirubin Total: 0.5 mg/dL (ref 0.0–1.2)
CO2: 24 mmol/L (ref 20–29)
Calcium: 10.4 mg/dL — ABNORMAL HIGH (ref 8.7–10.3)
Chloride: 101 mmol/L (ref 96–106)
Creatinine, Ser: 1.3 mg/dL — ABNORMAL HIGH (ref 0.57–1.00)
Globulin, Total: 2.6 g/dL (ref 1.5–4.5)
Glucose: 89 mg/dL (ref 70–99)
Potassium: 5 mmol/L (ref 3.5–5.2)
Sodium: 139 mmol/L (ref 134–144)
Total Protein: 6.7 g/dL (ref 6.0–8.5)
eGFR: 45 mL/min/{1.73_m2} — ABNORMAL LOW (ref >59)

## 2024-01-30 LAB — HEMOGLOBIN A1C
Est. average glucose Bld gHb Est-mCnc: 126 mg/dL
Hgb A1c MFr Bld: 6 % — ABNORMAL HIGH (ref 4.8–5.6)

## 2024-01-30 NOTE — Patient Outreach (Signed)
 Complex Care Management   Visit Note  01/30/2024  Name:  Sara [name] MRN: [id number] DOB: 19-Jan-1956  Situation: Referral received for Complex Care Management related to SDOH Barriers:  Stress and Hypertension  I obtained verbal consent from patient.  Visit completed with patient  on the phone  Background:   Past Medical History:  Diagnosis Date   Anxiety    Blood dyscrasia    Chronic kidney disease    Gout    Hyperlipemia    Hypertension    LVH (left ventricular hypertrophy)    12/21/09 Echo -mild asymmetric LVH,EF =>55%   Morbid obesity (HCC)    Myocardial infarction (HCC)    Nausea vomiting and diarrhea    OSA (obstructive sleep apnea)    PAD (peripheral artery disease) (HCC)    Pneumonia    Pre-diabetes    Renal artery stenosis (HCC) 12/13/2010   PTCA and Stent - right   Seizures (HCC)    Syncopal episodes    Thrombocytosis    Type 2 diabetes mellitus (HCC) 07/20/2013    Assessment: Patient Reported Symptoms:  Cognitive Alert and oriented to person, place, and time  Neurological No symptoms reported    HEENT No symptoms reported    Cardiovascular No symptoms reported    Respiratory No symptoms reported    Endocrine No symptoms reported    Gastrointestinal No symptoms reported    Genitourinary No symptoms reported    Integumentary No symptoms reported    Musculoskeletal No symptoms reported    Psychosocial No symptoms reported     There were no vitals filed for this visit.  Medications Reviewed Today     Reviewed by Riley Churches, RN (Registered Nurse) on 01/30/24 at 1300  Med List Status: <None>   Medication Order Taking? Sig Documenting Provider Last Dose Status Informant  amLODipine (NORVASC) 2.5 MG tablet [id number] Yes Take 1 tablet (2.5 mg total) by mouth daily. Dorothyann Peng, MD Taking Active   anagrelide [employer] Newcomer) 1 MG capsule [id number] Yes TAKE 1 CAPSULE(1 MG) BY MOUTH DAILY Heilingoetter, Cassandra L, PA-C Taking Active   aspirin  EC 81 MG tablet [id number] Yes Take 81 mg by mouth daily. Swallow whole. [provider] Taking Active Self  atorvastatin (LIPITOR) 40 MG tablet [id number] Yes Take 1 tablet (40 mg total) by mouth daily. Dorothyann Peng, MD Taking Active   Cholecalciferol (VITAMIN D3) 2000 UNITS capsule [id number] Yes Take 2,000 Units by mouth daily.  [provider] Taking Active Self  glucose blood (CONTOUR TEST) test strip [id number]  Use once daily to check blood sugars. Dorothyann Peng, MD  Active   hydrOXYzine (ATARAX) 10 MG tablet [id number] No Take 10 mg by mouth 3 (three) times daily as needed.  Patient not taking: Reported on 01/30/2024   [provider] Not Taking Active   Lacosamide 150 MG TABS [id number] Yes TAKE 1 TABLET BY MOUTH TWICE DAILY Butch Penny, NP Taking Active   Magnesium 250 MG TABS [id number] Yes Take 250 mg by mouth 2 (two) times daily. [provider] Taking Active Self  metoprolol tartrate (LOPRESSOR) 100 MG tablet [id number] Yes TAKE 1 TABLET(100 MG) BY MOUTH TWICE DAILY Dorothyann Peng, MD Taking Active   Prenatal Vit-Fe Fumarate-FA (PRENATAL MULTIVITAMIN/IRON PO) [id number] Yes Take by mouth. [provider] Taking Active   telmisartan (MICARDIS) 80 MG tablet [id number] Yes Take 1 tablet (80 mg total) by mouth daily. Dorothyann Peng, MD Taking Active  Recommendation:   PCP Follow-up  Follow Up Plan:   Telephone follow up appointment date/time:  02/22/24 @1 :30 PM   [name] RN BSN CCM [facility], [facility] Nurse Care Coordinator  Direct Dial: [phone] Website: [name]

## 2024-01-30 NOTE — Patient Instructions (Signed)
 Visit Information  Thank you for taking time to visit with me today. Please don't hesitate to contact me if I can be of assistance to you before our next scheduled appointment.  Our next appointment is by telephone on 02/22/24 at 1:30 PM Please call the care guide team at [phone] if you need to cancel or reschedule your appointment.   Following is a copy of your care plan:   Goals Addressed               This Visit's Progress     Patient Stated     COMPLETED: I want to keep my blood pressure under good control (pt-stated)        Care Coordination Interventions: See new goal       Other     COMPLETED: To improve caregiver stress        Care Coordination Interventions: See new goal       COMPLETED: To work on lowering A1c        Care Coordination Interventions: Provided education to patient about basic DM disease process Reviewed medications with patient and discussed importance of medication adherence Review of patient status, including review of consultants reports, relevant laboratory and other test results, and medications completed Counseled on the importance of exercise goals with target of 150 minutes per week  Positive reinforcement given to patient for making efforts to improve her diabetes and overall health  Lab Results  Component Value Date   HGBA1C 6.0 (H) 01/29/2024         VBCI RN Care Plan related to caregiver stress        Problems:  Care Coordination needs related to Stress  Goal: Over the next 60 days the Patient will continue to work with RN Care Manager and/or Social Worker to address care management and care coordination needs related to caregiver stress as evidenced by adherence to CM Team Scheduled appointments      Interventions:   SDOH Barriers (Status:  New goal.) Long Term Goal Patient interviewed and SDOH assessment performed        SDOH Interventions    Flowsheet Row Care Coordination from 01/30/2024 in [place] [facility] Clinical Support from 12/06/2023 in [facility] Care Coordination from 11/13/2023 in [facility] Care Coordination from 10/05/2023 in [facility] Coord[facility] Clinical Support from 11/17/2022 in [facility] Care Coordination from 09/27/2022 in [facility] Care Coordination  SDOH Interventions        Food Insecurity Interventions Intervention Not Indicated Intervention Not Indicated -- Intervention Not Indicated Intervention Not Indicated Intervention Not Indicated  Housing Interventions Intervention Not Indicated Intervention Not Indicated -- Intervention Not Indicated -- Intervention Not Indicated  Transportation Interventions Intervention Not Indicated Intervention Not Indicated -- Intervention Not Indicated Intervention Not Indicated Intervention Not Indicated  Utilities Interventions Intervention Not Indicated Intervention Not Indicated Intervention Not Indicated -- -- --  Alcohol Usage Interventions -- Intervention Not Indicated (Score <7) -- -- -- --  Depression Interventions/Treatment  Counseling  [Patient is working with VBCI LCSW] PHQ2-9 Score <4 Follow-up Not Indicated -- -- -- --  Financial Strain Interventions -- Intervention Not Indicated -- -- Intervention Not Indicated --  Physical Activity Interventions -- Intervention Not Indicated Intervention Not Indicated -- Patient Refused, Other (Comments) --  Stress Interventions -- Intervention Not Indicated Other (Comment)  [patient established with LCSW Jasmine Lewis] -- Other (Comment) --  Social Connections Interventions -- Intervention Not Indicated -- -- -- --  Health Literacy Interventions -- Intervention Not Indicated -- -- -- --      Patient interviewed and appropriate assessments performed Discussed plans with patient for ongoing care management follow up and  provided patient with direct contact information for care management team Provided education and assistance to client regarding Advanced Directives. Collaborated with LCSW Jenel Lucks requesting an ACP be mailed to patient  Patient Self-Care Activities:  Attend all scheduled provider appointments Call provider office for new concerns or questions  Take medications as prescribed   Work with the social worker to address care coordination needs and will continue to work with the clinical team to address health care and disease management related needs  Plan:  Telephone follow up appointment with care management team member scheduled for:  02/22/24 @1 :30 PM           VBCI RN Care Plan related to Hypertension        Problems:  Care Coordination needs related to Stress Chronic Disease Management support and education needs related to HTN  Goal: Over the next 30-45 days the Patient will collaborate with the care management team towards completion of advanced directives as evidenced by patient will verbalize receiving and reviewing the advanced directive packet  demonstrate improved ongoing adherence to her prescribed treatment plan for hypertension as evidenced by patient will check her BP three times weekly and record her readings   Interventions:   Hypertension Interventions:  (Status:  New goal.) Short Term Goal Last practice recorded BP readings:  BP Readings from Last 3 Encounters:  01/29/24 (!) 150/82  12/18/23 130/60  12/12/23 118/68   Most recent eGFR/CrCl:  Lab Results  Component Value Date   EGFR 45 (L) 01/29/2024    No components found for: "CRCL"  Evaluation of current treatment plan related to hypertension self management and patient's adherence to plan as established by provider Reviewed medications with patient and discussed importance of compliance Counseled on the importance of exercise goals with target of 150 minutes per week Discussed plans with patient for  ongoing care management follow up and provided patient with direct contact information for care management team Advised patient, providing education and rationale, to monitor blood pressure daily and record, calling PCP for findings outside established parameters Reviewed scheduled/upcoming provider appointments including: 02/20/24 @10 :00 for BP check  Advised patient to ask her provider and or pharmacist to check her BP cuff for accuracy  Provided education on importance of adhering to prescribed low Sodium diet  Patient Self-Care Activities:  Attend all scheduled provider appointments Call pharmacy for medication refills 3-7 days in advance of running out of medications Call provider office for new concerns or questions  Take medications as prescribed   check blood pressure 3 times per week keep a blood pressure log take blood pressure log to all doctor appointments call doctor for signs and symptoms of high blood pressure keep all doctor appointments take medications for blood pressure exactly as prescribed Have BP cuff checked for accuracy  Plan:  Next PCP appointment scheduled for: 02/20/24 @10 :00 AM Telephone follow up appointment with care management team member scheduled for:  02/22/24 @1 :30 PM             Please call 1-800-273-TALK (toll free, 24 hour hotline) if you are experiencing a Mental Health or Behavioral Health Crisis or need someone to talk to.  Patient verbalizes understanding of instructions and care plan provided today and  agrees to view in [place]. Active MyChart status and patient understanding of how to access instructions and care plan via MyChart confirmed with patient.     Delsa Sale RN BSN CCM [place]  [facility], [name] Nurse Care Coordinator  Direct Dial: [phone] Website: [email or web address]

## 2024-02-01 ENCOUNTER — Encounter: Payer: Self-pay | Admitting: Licensed Clinical Social Worker

## 2024-02-01 ENCOUNTER — Inpatient Hospital Stay: Payer: Medicare PPO | Attending: Nurse Practitioner

## 2024-02-01 DIAGNOSIS — D473 Essential (hemorrhagic) thrombocythemia: Secondary | ICD-10-CM | POA: Insufficient documentation

## 2024-02-01 DIAGNOSIS — Z130 Encounter for screening for diseases of the blood and blood-forming organs and certain disorders involving the immune mechanism: Secondary | ICD-10-CM

## 2024-02-01 LAB — CBC WITH DIFFERENTIAL (CANCER CENTER ONLY)
Abs Immature Granulocytes: 0.04 10*3/uL (ref 0.00–0.07)
Basophils Absolute: 0 10*3/uL (ref 0.0–0.1)
Basophils Relative: 1 %
Eosinophils Absolute: 0.4 10*3/uL (ref 0.0–0.5)
Eosinophils Relative: 5 %
HCT: 38.2 % (ref 36.0–46.0)
Hemoglobin: 12.8 g/dL (ref 12.0–15.0)
Immature Granulocytes: 1 %
Lymphocytes Relative: 20 %
Lymphs Abs: 1.5 10*3/uL (ref 0.7–4.0)
MCH: 29.9 pg (ref 26.0–34.0)
MCHC: 33.5 g/dL (ref 30.0–36.0)
MCV: 89.3 fL (ref 80.0–100.0)
Monocytes Absolute: 0.8 10*3/uL (ref 0.1–1.0)
Monocytes Relative: 11 %
Neutro Abs: 4.6 10*3/uL (ref 1.7–7.7)
Neutrophils Relative %: 62 %
Platelet Count: 355 10*3/uL (ref 150–400)
RBC: 4.28 MIL/uL (ref 3.87–5.11)
RDW: 13.7 % (ref 11.5–15.5)
WBC Count: 7.3 10*3/uL (ref 4.0–10.5)
nRBC: 0 % (ref 0.0–0.2)

## 2024-02-03 NOTE — Assessment & Plan Note (Signed)
 She was given sample of Zyrtec 10mg  daily.  She can try 1/2 tablet first to see if she develops drowsiness.  Advised to take nightly prn.

## 2024-02-03 NOTE — Assessment & Plan Note (Signed)
 Diabetes well-controlled without medication. Discussed potential future medication need if A1c rises above 7 or for cardiovascular and renal protection. - Include information on Farxiga and Jardiance in after visit summary. - Monitor A1c levels.

## 2024-02-03 NOTE — Assessment & Plan Note (Addendum)
 Chronic, uncontrolled. Goal BP<120/80.  She admits to having a stressful day. She will continue with metoprolol tartrate 100mg  twice daily, amlodipine 2.5mg  and telmisartan 80mg  daily. She is encouraged to follow low sodium diet. Blood pressure elevated at 150/82 mmHg. Stress may contribute. Adherent to medication but inconsistent dietary intake and lacks regular home monitoring. - Recheck blood pressure in office in three weeks during nurse visit. - Encourage home blood pressure monitoring. - I plan to increase dose of amlodipine IF her blood pressure remains high.

## 2024-02-06 ENCOUNTER — Other Ambulatory Visit: Payer: Self-pay | Admitting: Licensed Clinical Social Worker

## 2024-02-07 NOTE — Assessment & Plan Note (Signed)
 Chronic, sx are stable. July 2024 echo results reviewed in detail. She is encouraged to follow a low sodium diet. Most recent Cardiology notes reviewed.

## 2024-02-09 NOTE — Patient Instructions (Signed)
 Visit Information  Thank you for taking time to visit with me today. Please don't hesitate to contact me if I can be of assistance to you before our next scheduled appointment.  Our next appointment is by telephone on 5/13 at 1:30 PM Please call the care guide team at [phone] if you need to cancel or reschedule your appointment.   Following is a copy of your care plan:   Goals Addressed             This Visit's Progress    LCSW VBCI Social Work Care Plan   On track    Problems:   Caregiver Stress  CSW Clinical Goal(s):   Over the next 90 days the Patient will attend all scheduled medical appointments as evidenced by patient report and care team review of appointment completion in electronic MEDICAL RECORD NUMBER  demonstrate a reduction in symptoms related to Caregiver Stress .  Interventions:  Mental Health:  Evaluation of current treatment plan related to Caregiver Stress Active listening / Reflection utilized Behavioral Activation reviewed Emotional Support Provided Mindfulness or Relaxation training provided Solution-Focued Strategies employed:  Patient Goals/Self-Care Activities:  Increase coping skills, healthy habits, and self-management skills  Plan:   Telephone follow up appointment with care management team member scheduled for:  4-6 weeks     COMPLETED: LCSW-Strengthen Support       Activities and task to complete in order to accomplish goals.   Keep all upcoming appointments discussed today Continue with compliance of taking medication prescribed by Doctor Implement healthy coping skills discussed to assist with management of symptoms Follow up with community agencies regarding goals for son Solution-Focused Strategies employed:  Active listening / Reflection utilized  Emotional Support Provided Patient identified stressors that negatively impact mental health Validation and encouragement provided Patient is motivated to continue with management of  health conditions Healthy coping skills discussed         Please call the Suicide and Crisis Lifeline: 988 call 911 if you are experiencing a Mental Health or Behavioral Health Crisis or need someone to talk to.  Patient verbalizes understanding of instructions and care plan provided today and agrees to view in MyChart. Active MyChart status and patient understanding of how to access instructions and care plan via MyChart confirmed with patient.     Arlis Bent [facility]  [facility], [facility] Clinical Social Worker Direct Dial: [phone]  Fax: [phone] Website: [email or web address] 12:26 PM

## 2024-02-09 NOTE — Patient Outreach (Signed)
 Complex Care Management   Visit Note  02/06/2024  Name:  Sara [name] MRN: [id number] DOB: [birth year]  Situation: Referral received for Complex Care Management related to SDOH Barriers:  Stress I obtained verbal consent from Patient.  Visit completed with Pt  on the phone  Background:   Past Medical History:  Diagnosis Date   Anxiety    Blood dyscrasia    Chronic kidney disease    Gout    Hyperlipemia    Hypertension    LVH (left ventricular hypertrophy)    12/21/09 Echo -mild asymmetric LVH,EF =>55%   Morbid obesity (HCC)    Myocardial infarction (HCC)    Nausea vomiting and diarrhea    OSA (obstructive sleep apnea)    PAD (peripheral artery disease) (HCC)    Pneumonia    Pre-diabetes    Renal artery stenosis (HCC) 12/13/2010   PTCA and Stent - right   Seizures (HCC)    Syncopal episodes    Thrombocytosis    Type 2 diabetes mellitus (HCC) 07/20/2013    Assessment: Patient Reported Symptoms:  Cognitive Cognitive Status: Alert and oriented to person, place, and time   Health Maintenance Behaviors: Stress management Healing Pattern: Average Health Facilitated by: Stress management  Neurological Neurological Review of Symptoms: No symptoms reported    HEENT HEENT Symptoms Reported: No symptoms reported HEENT Conditions: Vision problem(s) Vision Problems: cataract(s) HEENT Management Strategies: Routine screening HEENT Self-Management Outcome: 4 (good) Vision problem(s)  Cardiovascular Cardiovascular Symptoms Reported: No symptoms reported Does patient have uncontrolled Hypertension?: Yes Is patient checking Blood Pressure at home?: Yes Cardiovascular Conditions: Hypertension Cardiovascular Management Strategies: Medication therapy, Diet modification, Weight management Do You Have a Working Readable Scale?: Yes Weight: 156 lb (70.8 kg) Cardiovascular Self-Management Outcome: 4 (good)  Respiratory Respiratory Symptoms Reported: No symptoms reported     Endocrine Patient reports the following symptoms related to hypoglycemia or hyperglycemia : No symptoms reported Is patient diabetic?: Yes Is patient checking blood sugars at home?: No Endocrine Management Strategies: Medication therapy, Diet modification Endocrine Self-Management Outcome: 4 (good)  Gastrointestinal Gastrointestinal Symptoms Reported: No symptoms reported      Genitourinary Genitourinary Symptoms Reported: No symptoms reported    Integumentary Integumentary Symptoms Reported: No symptoms reported    Musculoskeletal Musculoskelatal Symptoms Reviewed: No symptoms reported   Falls in the past year?: No Number of falls in past year: 1 or less Was there an injury with Fall?: No Fall Risk Category Calculator: 0 Patient Fall Risk Level: Low Fall Risk    Psychosocial Psychosocial Symptoms Reported: No symptoms reported Behavioral Health Conditions: Other Other Behavorial Health Conditions: Caregiver Stress Behavioral Management Strategies: Adequate rest, Coping strategies, Support system Behavioral Health Self-Management Outcome: 4 (good) Major Change/Loss/Stressor/Fears (CP): Medical condition, self, Medical condition, family Techniques to [place] with Loss/Stress/Change: Diversional activities Quality of Family Relationships: supportive, stressful, involved Do you feel physically threatened by others?: No      01/30/2024    1:17 PM  Depression screen PHQ 2/9  Decreased Interest 1  Down, Depressed, Hopeless 1  PHQ - 2 Score 2  Altered sleeping 0  Tired, decreased energy 0  Change in appetite 0  Feeling bad or failure about yourself  0  Trouble concentrating 0  Moving slowly or fidgety/restless 0  Suicidal thoughts 0  PHQ-9 Score 2    There were no vitals filed for this visit.  Medications Reviewed Today     Reviewed by Adriana Albany, LCSW (Social Worker) on 02/06/24 at 1341  Med  List Status: <None>   Medication Order Taking? Sig Documenting Provider Last  Dose Status Informant  amLODipine  (NORVASC ) 2.5 MG tablet [id number]  Take 1 tablet (2.5 mg total) by mouth daily. Cleave Curling, MD  Active   anagrelide  ([name]) 1 MG capsule [id number]  TAKE 1 CAPSULE(1 MG) BY MOUTH DAILY Heilingoetter, Cassandra L, PA-C  Active   aspirin  EC 81 MG tablet [id number]  Take 81 mg by mouth daily. Swallow whole. [provider]  Active Self  atorvastatin  (LIPITOR) 40 MG tablet [id number]  Take 1 tablet (40 mg total) by mouth daily. Cleave Curling, MD  Active   Cholecalciferol  (VITAMIN D3) 2000 UNITS capsule [id number]  Take 2,000 Units by mouth daily.  [provider]  Active Self  glucose blood (CONTOUR TEST) test strip [id number]  Use once daily to check blood sugars. Cleave Curling, MD  Active   hydrOXYzine (ATARAX) 10 MG tablet [id number]  Take 10 mg by mouth 3 (three) times daily as needed.  Patient not taking: Reported on 01/30/2024   [provider]  Active   Lacosamide  150 MG TABS [id number]  TAKE 1 TABLET BY MOUTH TWICE DAILY Millikan, Megan, NP  Active   Magnesium  250 MG TABS [id number]  Take 250 mg by mouth 2 (two) times daily. [provider]  Active Self  metoprolol  tartrate (LOPRESSOR ) 100 MG tablet [id number]  TAKE 1 TABLET(100 MG) BY MOUTH TWICE DAILY Cleave Curling, MD  Active   Prenatal Vit-Fe Fumarate-FA (PRENATAL MULTIVITAMIN/IRON PO) [id number]  Take by mouth. [provider]  Active   telmisartan  (MICARDIS ) 80 MG tablet [id number]  Take 1 tablet (80 mg total) by mouth daily. Cleave Curling, MD  Active             Recommendation:   Continue utilizing strategies discussed to assist with symptom management  Follow Up Plan:   Telephone follow-up in 1 month  Alease Hunter, LCSW [facility]  [facility], [facility] Clinical Social Worker Direct Dial: [phone]  Fax: [phone] Website: [email or web address] 12:26 PM

## 2024-02-14 ENCOUNTER — Other Ambulatory Visit: Payer: Self-pay | Admitting: Physician Assistant

## 2024-02-14 DIAGNOSIS — D473 Essential (hemorrhagic) thrombocythemia: Secondary | ICD-10-CM

## 2024-02-20 ENCOUNTER — Ambulatory Visit

## 2024-02-20 VITALS — BP 140/68 | HR 60 | Temp 98.7°F | Ht 60.0 in | Wt 156.0 lb

## 2024-02-20 DIAGNOSIS — I130 Hypertensive heart and chronic kidney disease with heart failure and stage 1 through stage 4 chronic kidney disease, or unspecified chronic kidney disease: Secondary | ICD-10-CM

## 2024-02-20 NOTE — Progress Notes (Signed)
 Patient presents today for a bpc. Patient reports compliance with her meds. Patient reports she takes telmisartan  80mg  and metoprolol  100mg  in the mornings and amlodipine  2.5mg  and metoprolol  100mg  in the evenings at 6pm. I checked her blood pressure and it is 140/60 P63. I had patient wait 10 minutes and rechecked and it was 140/68 P60. After speaking with provider patient was advised to make sure she is exercising at least 150 minutes per week and continue with her current medication. She is to call her cardiologist to schedule a follow up with them. YL,RMA     BP Readings from Last 3 Encounters:  01/29/24 (!) 150/82  12/18/23 130/60  12/12/23 118/68

## 2024-02-20 NOTE — Patient Instructions (Signed)
 Hypertension, Adult Hypertension is another name for high blood pressure. High blood pressure forces your heart to work harder to pump blood. This can cause problems over time. There are two numbers in a blood pressure reading. There is a top number (systolic) over a bottom number (diastolic). It is best to have a blood pressure that is below 120/80. What are the causes? The cause of this condition is not known. Some other conditions can lead to high blood pressure. What increases the risk? Some lifestyle factors can make you more likely to develop high blood pressure: Smoking. Not getting enough exercise or physical activity. Being overweight. Having too much fat, sugar, calories, or salt (sodium) in your diet. Drinking too much alcohol. Other risk factors include: Having any of these conditions: Heart disease. Diabetes. High cholesterol. Kidney disease. Obstructive sleep apnea. Having a family history of high blood pressure and high cholesterol. Age. The risk increases with age. Stress. What are the signs or symptoms? High blood pressure may not cause symptoms. Very high blood pressure (hypertensive crisis) may cause: Headache. Fast or uneven heartbeats (palpitations). Shortness of breath. Nosebleed. Vomiting or feeling like you may vomit (nauseous). Changes in how you see. Very bad chest pain. Feeling dizzy. Seizures. How is this treated? This condition is treated by making healthy lifestyle changes, such as: Eating healthy foods. Exercising more. Drinking less alcohol. Your doctor may prescribe medicine if lifestyle changes do not help enough and if: Your top number is above 130. Your bottom number is above 80. Your personal target blood pressure may vary. Follow these instructions at home: Eating and drinking  If told, follow the DASH eating plan. To follow this plan: Fill one half of your plate at each meal with fruits and vegetables. Fill one fourth of your plate  at each meal with whole grains. Whole grains include whole-wheat pasta, brown rice, and whole-grain bread. Eat or drink low-fat dairy products, such as skim milk or low-fat yogurt. Fill one fourth of your plate at each meal with low-fat (lean) proteins. Low-fat proteins include fish, chicken without skin, eggs, beans, and tofu. Avoid fatty meat, cured and processed meat, or chicken with skin. Avoid pre-made or processed food. Limit the amount of salt in your diet to less than 1,500 mg each day. Do not drink alcohol if: Your doctor tells you not to drink. You are pregnant, may be pregnant, or are planning to become pregnant. If you drink alcohol: Limit how much you have to: 0-1 drink a day for women. 0-2 drinks a day for men. Know how much alcohol is in your drink. In the [place] one drink equals one 12 oz bottle of beer (355 mL), one 5 oz glass of wine (148 mL), or one 1 oz glass of hard liquor (44 mL). Lifestyle  Work with your doctor to stay at a healthy weight or to lose weight. Ask your doctor what the best weight is for you. Get at least 30 minutes of exercise that causes your heart to beat faster (aerobic exercise) most days of the week. This may include walking, swimming, or biking. Get at least 30 minutes of exercise that strengthens your muscles (resistance exercise) at least 3 days a week. This may include lifting weights or doing Pilates. Do not smoke or use any products that contain nicotine or tobacco. If you need help quitting, ask your doctor. Check your blood pressure at home as told by your doctor. Keep all follow-up visits. Medicines Take over-the-counter and prescription medicines  only as told by your doctor. Follow directions carefully. Do not skip doses of blood pressure medicine. The medicine does not work as well if you skip doses. Skipping doses also puts you at risk for problems. Ask your doctor about side effects or reactions to medicines that you should watch  for. Contact a doctor if: You think you are having a reaction to the medicine you are taking. You have headaches that keep coming back. You feel dizzy. You have swelling in your ankles. You have trouble with your vision. Get help right away if: You get a very bad headache. You start to feel mixed up (confused). You feel weak or numb. You feel faint. You have very bad pain in your: Chest. Belly (abdomen). You vomit more than once. You have trouble breathing. These symptoms may be an emergency. Get help right away. Call 911. Do not wait to see if the symptoms will go away. Do not drive yourself to the hospital. Summary Hypertension is another name for high blood pressure. High blood pressure forces your heart to work harder to pump blood. For most people, a normal blood pressure is less than 120/80. Making healthy choices can help lower blood pressure. If your blood pressure does not get lower with healthy choices, you may need to take medicine. This information is not intended to replace advice given to you by your health care provider. Make sure you discuss any questions you have with your health care provider. Document Revised: 07/29/2021 Document Reviewed: 07/29/2021 Elsevier Patient Education  2024 [employer].

## 2024-02-22 ENCOUNTER — Other Ambulatory Visit: Payer: Self-pay

## 2024-02-22 NOTE — Patient Outreach (Signed)
 Complex Care Management   Visit Note  02/22/2024  Name:  Sara [name] MRN: [id number] DOB: 1956/06/11  Situation: Referral received for Complex Care Management related to  Hypertensive heart and renal disease with renal failure, stage 1 through stage 4 or unspecified chronic kidney disease, with heart failure.  I obtained verbal consent from Patient.  Visit completed with patient on the phone.  Background:   Past Medical History:  Diagnosis Date   Anxiety    Blood dyscrasia    Chronic kidney disease    Gout    Hyperlipemia    Hypertension    LVH (left ventricular hypertrophy)    12/21/09 Echo -mild asymmetric LVH,EF =>55%   Morbid obesity (HCC)    Myocardial infarction (HCC)    Nausea vomiting and diarrhea    OSA (obstructive sleep apnea)    PAD (peripheral artery disease) (HCC)    Pneumonia    Pre-diabetes    Renal artery stenosis (HCC) 12/13/2010   PTCA and Stent - right   Seizures (HCC)    Syncopal episodes    Thrombocytosis    Type 2 diabetes mellitus (HCC) 07/20/2013    Assessment: Patient Reported Symptoms:  Cognitive Cognitive Status: Alert and oriented to person, place, and time Cognitive/Intellectual Conditions Management [RPT]: None reported or documented in medical history or problem list   Health Maintenance Behaviors: Annual physical exam, Healthy diet, Stress management  Neurological Neurological Review of Symptoms: No symptoms reported Neurological Conditions: Seizures Neurological Management Strategies: Routine screening, Medication therapy Neurological Self-Management Outcome: 4 (good)  HEENT HEENT Symptoms Reported: No symptoms reported      Cardiovascular Cardiovascular Symptoms Reported: No symptoms reported Does patient have uncontrolled Hypertension?: Yes Is patient checking Blood Pressure at home?: Yes Patient's Recent BP reading at home: no BP taken today Cardiovascular Conditions: Hypertension Cardiovascular Management Strategies:  Routine screening, Medication therapy, Diet modification Do You Have a Working Readable Scale?: Yes Cardiovascular Self-Management Outcome: 3 (uncertain)  Respiratory Respiratory Symptoms Reported: Not assesed    Endocrine Patient reports the following symptoms related to hypoglycemia or hyperglycemia : No symptoms reported Is patient diabetic?: Yes Endocrine Conditions: Diabetes Endocrine Management Strategies: Routine screening, Diet modification, Exercise Endocrine Self-Management Outcome: 4 (good)  Gastrointestinal Gastrointestinal Symptoms Reported: Not assessed      Genitourinary Genitourinary Symptoms Reported: No symptoms reported Genitourinary Conditions: Chronic kidney disease Genitourinary Management Strategies: Fluid modification Genitourinary Self-Management Outcome: 4 (good)  Integumentary Integumentary Symptoms Reported: Not assessed    Musculoskeletal Musculoskelatal Symptoms Reviewed: No symptoms reported        Psychosocial Psychosocial Symptoms Reported: Depression - if selected complete PHQ 2-9            02/22/2024    1:44 PM  Depression screen PHQ 2/9  Decreased Interest 0  Down, Depressed, Hopeless 0  PHQ - 2 Score 0    There were no vitals filed for this visit.  Medications Reviewed Today     Reviewed by Kaylene Pascal, RN (Registered Nurse) on 02/22/24 at 1340  Med List Status: <None>   Medication Order Taking? Sig Documenting Provider Last Dose Status Informant  amLODipine  (NORVASC ) 2.5 MG tablet [id number] Yes Take 1 tablet (2.5 mg total) by mouth daily. Cleave Curling, MD Taking Active   anagrelide  ([name]) 1 MG capsule [id number] Yes TAKE 1 CAPSULE(1 MG) BY MOUTH DAILY Boscia, Heather E, NP Taking Active   aspirin  EC 81 MG tablet [id number] Yes Take 81 mg by mouth daily. Swallow whole. [provider] Taking Active  Self  atorvastatin  (LIPITOR) 40 MG tablet [id number] Yes Take 1 tablet (40 mg total) by mouth daily. Cleave Curling, MD  Taking Active   Cholecalciferol  (VITAMIN D3) 2000 UNITS capsule [id number] Yes Take 2,000 Units by mouth daily.  [provider] Taking Active Self  glucose blood (CONTOUR TEST) test strip [id number]  Use once daily to check blood sugars. Cleave Curling, MD  Active   hydrOXYzine (ATARAX) 10 MG tablet [id number] No Take 10 mg by mouth 3 (three) times daily as needed.  Patient not taking: Reported on 02/22/2024   [provider] Not Taking Active   Lacosamide  150 MG TABS [id number] Yes TAKE 1 TABLET BY MOUTH TWICE DAILY Millikan, Megan, NP Taking Active   Magnesium  250 MG TABS [id number] Yes Take 250 mg by mouth 2 (two) times daily. [provider] Taking Active Self  metoprolol  tartrate (LOPRESSOR ) 100 MG tablet [id number] Yes TAKE 1 TABLET(100 MG) BY MOUTH TWICE DAILY Cleave Curling, MD Taking Active   Prenatal Vit-Fe Fumarate-FA (PRENATAL MULTIVITAMIN/IRON PO) [id number] Yes Take by mouth. [provider] Taking Active   telmisartan  (MICARDIS ) 80 MG tablet [id number] Yes Take 1 tablet (80 mg total) by mouth daily. Cleave Curling, MD Taking Active             Recommendation:   Keep your appointment with [facility] scheduled for Tuesday, May 20 at 10:05 AM   Follow Up Plan:   Telephone follow up appointment date/time:  Tuesday, June 3 at 1:30 PM  Louanne Roussel RN BSN CCM [place]  [facility], [facility] Nurse Care Coordinator  Direct Dial: [phone] Website: [email or web address]

## 2024-02-22 NOTE — Patient Instructions (Signed)
 Visit Information  Thank you for taking time to visit with me today. Please don't hesitate to contact me if I can be of assistance to you before our next scheduled appointment.  Our next appointment is by telephone on Tuesday, June 3 at 1:30 Please call the care guide team at [phone] if you need to cancel or reschedule your appointment.   Following is a copy of your care plan:   Goals Addressed             This Visit's Progress    VBCI RN Care Plan related to Hypertensive heart and renal disease with renal failure, stage 1 through stage 4 or unspecified chronic kidney disease, with heart failure   On track    Problems:  Care Coordination needs related to Stress Chronic Disease Management support and education needs related to Hypertensive heart and renal disease with renal failure, stage 1 through stage 4 or unspecified chronic kidney disease, with heart failure   Goal: Over the next 90 days the Patient will collaborate with the care management team towards completion of advanced directives as evidenced by patient will verbalize receiving and reviewing the advanced directive packet  demonstrate improved ongoing adherence to her prescribed treatment plan for hypertension as evidenced by patient will self-monitor her BP at home, at least three times weekly and record her readings, take all medications as prescribed and keep all scheduled follow up visits as directed   Interventions:  Hypertension Interventions: Last practice recorded BP readings:  BP Readings from Last 3 Encounters:  02/20/24 (!) 140/68  01/29/24 (!) 150/82  12/18/23 130/60   Most recent eGFR/CrCl:  Lab Results  Component Value Date   EGFR 45 (L) 01/29/2024    No components found for: "CRCL"  Evaluation of current treatment plan related to hypertension self management and patient's adherence to plan as established by provider Reviewed medications with patient and discussed importance of medication  adherence Advised patient, providing education and rationale, to monitor blood pressure at least 3 times weekly, checking at different times each day and record, calling PCP for findings outside established parameters Provided education on prescribed low Sodium Discussed plans with patient for ongoing care management follow up and provided patient with direct contact information for care management team Reviewed scheduled/upcoming provider appointments including: Cardiology provider, Lawana Pray NP with [facility] scheduled for 03/12/24 at 12:05 PM   Patient Self-Care Activities:  Attend all scheduled provider appointments Call pharmacy for medication refills 3-7 days in advance of running out of medications Call provider office for new concerns or questions  Take medications as prescribed   check blood pressure 3 times per week keep a blood pressure log take blood pressure log to all doctor appointments call doctor for signs and symptoms of high blood pressure keep all doctor appointments take medications for blood pressure exactly as prescribed Adhere to following a low Sodium diet   Plan:  Keep you upcoming scheduled visit with Cardiology provider Lawana Pray NP at [facility] scheduled for Tuesday, May 20 at 12:05 PM Telephone follow up appointment with care management team member scheduled for:  02/22/24 @1 :30 PM             Please call 1-800-273-TALK (toll free, 24 hour hotline) if you are experiencing a Mental Health or Behavioral Health Crisis or need someone to talk to.  Patient verbalizes understanding of instructions and care plan provided today and agrees to view in MyChart. Active MyChart status and  patient understanding of how to access instructions and care plan via MyChart confirmed with patient.     Louanne Roussel RN BSN CCM [place]  [facility], [facility] Nurse Care Coordinator  Direct Dial:  [phone] Website: [email or web address]

## 2024-02-29 NOTE — Patient Outreach (Signed)
 Received the following email from patient;   "Good morning Sara [name], I hope that you are well.    The reason that I am contacting you is because for the last several days.   I have been having problems with my right hand becoming stiff during the night.   I know that we talked on May 1st and I was not having any problems at that time.   Please contact me.   Thanks.  Sara [name]"  Returned secure email advising patient, this RN sent an in basket message regarding her concern to her PCP. Advised patient of next nurse availability/ telephone appointment scheduled for Tuesday, June 3 at 1:30 PM.

## 2024-03-05 ENCOUNTER — Other Ambulatory Visit: Payer: Self-pay | Admitting: Licensed Clinical Social Worker

## 2024-03-06 ENCOUNTER — Encounter: Payer: Self-pay | Admitting: Family Medicine

## 2024-03-06 ENCOUNTER — Ambulatory Visit (INDEPENDENT_AMBULATORY_CARE_PROVIDER_SITE_OTHER): Admitting: Family Medicine

## 2024-03-06 VITALS — BP 134/85 | HR 62 | Temp 98.9°F | Resp 16 | Wt 155.0 lb

## 2024-03-06 DIAGNOSIS — R569 Unspecified convulsions: Secondary | ICD-10-CM | POA: Diagnosis not present

## 2024-03-06 DIAGNOSIS — M65331 Trigger finger, right middle finger: Secondary | ICD-10-CM | POA: Diagnosis not present

## 2024-03-06 NOTE — Assessment & Plan Note (Signed)
 Continue Lacosamide  150 mg twice daily for seizures.

## 2024-03-06 NOTE — Progress Notes (Signed)
 Michail Africa CMA,acting as a Neurosurgeon for Melodie Spry, NP.,have documented all relevant documentation on the behalf of Melodie Spry, NP,as directed by  Melodie Spry, NP while in the presence of Melodie Spry, NP.  Subjective:  Patient ID: Sara [name] , female    DOB: 03-22-1956 , 68 y.o.   MRN: [id number]  Chief Complaint  Patient presents with   Hand Pain    Patient is 68 year old female who presents for evaluation of pain in the right middle finger and occasional right hand stiffness. Symptoms include stiffness and mild pain, going on for approx a week and half. She denies any tingling just pain and does not radiate.Patient states pain is about 6/10 at its worse.  Patient has a diagnosis of seizures and is followed by [facility], she is on Lacosamide  150 mg twice daily.   Hand Pain  The injury mechanism is unknown. The pain is present in the right fingers. The quality of the pain is described as aching. The pain does not radiate. The pain is at a severity of 6/10. The pain is moderate. The pain has been Intermittent since the incident. Pertinent negatives include no chest pain.      Past Medical History:  Diagnosis Date   Anxiety    Blood dyscrasia    Chronic kidney disease    Gout    Hyperlipemia    Hypertension    LVH (left ventricular hypertrophy)    12/21/09 Echo -mild asymmetric LVH,EF =>55%   Morbid obesity (HCC)    Myocardial infarction (HCC)    Nausea vomiting and diarrhea    OSA (obstructive sleep apnea)    PAD (peripheral artery disease) (HCC)    Pneumonia    Pre-diabetes    Renal artery stenosis (HCC) 12/13/2010   PTCA and Stent - right   Seizures (HCC)    Syncopal episodes    Thrombocytosis    Type 2 diabetes mellitus (HCC) 07/20/2013     Family History  Problem Relation Age of Onset   Heart failure Mother    Diabetes Father    Diabetes Sister    Hypertension Sister    Hypertension Sister    Hypertension Sister    Breast cancer  Maternal Aunt        2 maternal aunts had breast cancer.   Breast cancer Maternal Grandmother    Sleep apnea Neg Hx      Current Outpatient Medications:    amLODipine  (NORVASC ) 2.5 MG tablet, Take 1 tablet (2.5 mg total) by mouth daily., Disp: 90 tablet, Rfl: 2   anagrelide  ([name]) 1 MG capsule, TAKE 1 CAPSULE(1 MG) BY MOUTH DAILY, Disp: 30 capsule, Rfl: 5   aspirin  EC 81 MG tablet, Take 81 mg by mouth daily. Swallow whole., Disp: , Rfl:    atorvastatin  (LIPITOR) 40 MG tablet, Take 1 tablet (40 mg total) by mouth daily., Disp: 90 tablet, Rfl: 3   Cholecalciferol  (VITAMIN D3) 2000 UNITS capsule, Take 2,000 Units by mouth daily. , Disp: , Rfl:    glucose blood (CONTOUR TEST) test strip, Use once daily to check blood sugars., Disp: 100 each, Rfl: 1   Lacosamide  150 MG TABS, TAKE 1 TABLET BY MOUTH TWICE DAILY, Disp: 60 tablet, Rfl: 4   Magnesium  250 MG TABS, Take 250 mg by mouth 2 (two) times daily., Disp: , Rfl:    metoprolol  tartrate (LOPRESSOR ) 100 MG tablet, TAKE 1 TABLET(100 MG) BY MOUTH TWICE DAILY, Disp: 180 tablet, Rfl: 3  Prenatal Vit-Fe Fumarate-FA (PRENATAL MULTIVITAMIN/IRON PO), Take by mouth., Disp: , Rfl:    telmisartan  (MICARDIS ) 80 MG tablet, Take 1 tablet (80 mg total) by mouth daily., Disp: 90 tablet, Rfl: 2   hydrOXYzine (ATARAX) 10 MG tablet, Take 10 mg by mouth 3 (three) times daily as needed. (Patient not taking: Reported on 02/22/2024), Disp: , Rfl:    Allergies  Allergen Reactions   Allegra Allergy [Fexofenadine Hcl] Other (See Comments)    Generally not feeling well.    Sulfa Antibiotics Itching     Review of Systems  Constitutional: Negative.   HENT: Negative.    Respiratory: Negative.    Cardiovascular:  Negative for chest pain and palpitations.  Musculoskeletal:  Positive for arthralgias.     Today's Vitals   03/06/24 1548 03/06/24 1559  BP: (!) 135/90 134/85  Pulse: 62   Resp: 16   Temp: 98.9 F (37.2 C)   TempSrc: Oral   SpO2: 92%   Weight: 155  lb (70.3 kg)   PainSc: 6     Body mass index is 30.27 kg/m.  Wt Readings from Last 3 Encounters:  03/06/24 155 lb (70.3 kg)  02/20/24 156 lb (70.8 kg)  02/06/24 156 lb (70.8 kg)    The ASCVD Risk score (Arnett DK, et al., 2019) failed to calculate for the following reasons:   Risk score cannot be calculated because patient has a medical history suggesting prior/existing ASCVD  Objective:  Physical Exam HENT:     Head: Normocephalic and atraumatic.  Cardiovascular:     Rate and Rhythm: Normal rate and regular rhythm.  Pulmonary:     Breath sounds: Normal breath sounds.  Musculoskeletal:        General: Tenderness present.  Skin:    General: Skin is warm and dry.  Neurological:     General: No focal deficit present.     Mental Status: She is alert and oriented to person, place, and time.         Assessment And Plan:  Trigger middle finger of right hand -     Ambulatory referral to Orthopedic Surgery  Seizures [facility]) Assessment & Plan: Continue Lacosamide  150 mg twice daily for seizures.     Return for Keep next appointment.  Patient was given opportunity to ask questions. Patient verbalized understanding of the plan and was able to repeat key elements of the plan. All questions were answered to their satisfaction.   I, Melodie Spry, NP, have reviewed all documentation for this visit. The documentation on 03/06/2024 for the exam, diagnosis, procedures, and orders are all accurate and complete.     IF YOU HAVE BEEN REFERRED TO A SPECIALIST, IT MAY TAKE 1-2 WEEKS TO SCHEDULE/PROCESS THE REFERRAL. IF YOU HAVE NOT HEARD FROM US /SPECIALIST IN TWO WEEKS, PLEASE GIVE US  A CALL AT [phone] X [phone].

## 2024-03-08 ENCOUNTER — Other Ambulatory Visit: Payer: Self-pay | Admitting: Licensed Clinical Social Worker

## 2024-03-10 NOTE — Patient Outreach (Signed)
 Complex Care Management   Visit Note  03/05/2024  Name:  Sara [name] MRN: [id number] DOB: [birth year]  Situation: Referral received for Complex Care Management related to Caregiver Stress I obtained verbal consent from Patient.  Visit completed with pt  on the phone  Background:   Past Medical History:  Diagnosis Date   Anxiety    Blood dyscrasia    Chronic kidney disease    Gout    Hyperlipemia    Hypertension    LVH (left ventricular hypertrophy)    12/21/09 Echo -mild asymmetric LVH,EF =>55%   Morbid obesity (HCC)    Myocardial infarction (HCC)    Nausea vomiting and diarrhea    OSA (obstructive sleep apnea)    PAD (peripheral artery disease) (HCC)    Pneumonia    Pre-diabetes    Renal artery stenosis (HCC) 12/13/2010   PTCA and Stent - right   Seizures (HCC)    Syncopal episodes    Thrombocytosis    Type 2 diabetes mellitus (HCC) 07/20/2013    Assessment: Patient Reported Symptoms:  Cognitive Cognitive Status: Normal speech and language skills, Alert and oriented to person, place, and time      Neurological Neurological Review of Symptoms: No symptoms reported Neurological Conditions: Seizures Neurological Management Strategies: Medication therapy, Routine screening Neurological Self-Management Outcome: 4 (good)  HEENT HEENT Symptoms Reported: No symptoms reported      Cardiovascular Cardiovascular Symptoms Reported: No symptoms reported Does patient have uncontrolled Hypertension?: Yes Is patient checking Blood Pressure at home?: Yes Cardiovascular Conditions: Hypertension Cardiovascular Management Strategies: Routine screening, Medication therapy Do You Have a Working Readable Scale?: Yes  Respiratory Respiratory Symptoms Reported: No symptoms reported    Endocrine Patient reports the following symptoms related to hypoglycemia or hyperglycemia : No symptoms reported Is patient diabetic?: Yes Endocrine Conditions: Diabetes Endocrine Management  Strategies: Routine screening, Diet modification, Exercise  Gastrointestinal Gastrointestinal Symptoms Reported: No symptoms reported      Genitourinary Genitourinary Symptoms Reported: No symptoms reported Genitourinary Conditions: Chronic kidney disease  Integumentary Integumentary Symptoms Reported: No symptoms reported    Musculoskeletal Musculoskelatal Symptoms Reviewed: No symptoms reported        Psychosocial Psychosocial Symptoms Reported: Depression - if selected complete PHQ 2-9            02/22/2024    1:44 PM  Depression screen PHQ 2/9  Decreased Interest 0  Down, Depressed, Hopeless 0  PHQ - 2 Score 0    There were no vitals filed for this visit.  Medications Reviewed Today     Reviewed by Adriana Albany, LCSW (Social Worker) on 03/05/24 at 1335  Med List Status: <None>   Medication Order Taking? Sig Documenting Provider Last Dose Status Informant  amLODipine  (NORVASC ) 2.5 MG tablet [id number] No Take 1 tablet (2.5 mg total) by mouth daily. Cleave Curling, MD Taking Active   anagrelide  ([name]) 1 MG capsule [id number] No TAKE 1 CAPSULE(1 MG) BY MOUTH DAILY Boscia, Heather E, NP Taking Active   aspirin  EC 81 MG tablet [id number] No Take 81 mg by mouth daily. Swallow whole. [provider] Taking Active Self  atorvastatin  (LIPITOR) 40 MG tablet [id number] No Take 1 tablet (40 mg total) by mouth daily. Cleave Curling, MD Taking Active   Cholecalciferol  (VITAMIN D3) 2000 UNITS capsule [id number] No Take 2,000 Units by mouth daily.  [provider] Taking Active Self  glucose blood (CONTOUR TEST) test strip [id number] No Use once daily to check blood sugars. Cleave Curling, MD Taking  Active   hydrOXYzine (ATARAX) 10 MG tablet [id number] No Take 10 mg by mouth 3 (three) times daily as needed.  Patient not taking: Reported on 02/22/2024   [provider] Not Taking Active   Lacosamide  150 MG TABS [id number] No TAKE 1 TABLET BY MOUTH TWICE DAILY  Millikan, Megan, NP Taking Active   Magnesium  250 MG TABS [id number] No Take 250 mg by mouth 2 (two) times daily. [provider] Taking Active Self  metoprolol  tartrate (LOPRESSOR ) 100 MG tablet [id number] No TAKE 1 TABLET(100 MG) BY MOUTH TWICE DAILY Cleave Curling, MD Taking Active   Prenatal Vit-Fe Fumarate-FA (PRENATAL MULTIVITAMIN/IRON PO) [id number] No Take by mouth. [provider] Taking Active   telmisartan  (MICARDIS ) 80 MG tablet [id number] No Take 1 tablet (80 mg total) by mouth daily. Cleave Curling, MD Taking Active             Recommendation:   Continue utilizing strategies discussed to assist with symptom management  Follow Up Plan:   Telephone follow-up in 1 week  Alease Hunter, LCSW [place]  [facility], [facility] Clinical Social Worker Direct Dial: [phone]  Fax: [phone] Website: [email or web address] 3:18 PM

## 2024-03-10 NOTE — Patient Instructions (Signed)
 Visit Information  Thank you for taking time to visit with me today. Please don't hesitate to contact me if I can be of assistance to you before our next scheduled appointment.  Your next care management appointment is by telephone on 5/16 at 3:30 PM  Please call the care guide team at [phone] if you need to cancel, schedule, or reschedule an appointment.   Please call the Suicide and Crisis Lifeline: 988 go to [facility] Urgent [facility] [address] [place] [phone]) call 911 if you are experiencing a Mental Health or Behavioral Health Crisis or need someone to talk to.  Alease Hunter, LCSW   [facility], [facility] Clinical Social Worker Direct Dial: [phone]  Fax: [phone] Website: [email or web address] 3:19 PM

## 2024-03-10 NOTE — Progress Notes (Signed)
 Cardiology Clinic Note   Patient Name: Sara [name] Date of Encounter: 03/12/2024  Primary Care Provider:  Cleave Curling, MD Primary Cardiologist:  Luana Rumple, MD  Patient Profile     Sara [name] 68 year old female presents to the clinic today for follow-up evaluation of her coronary artery disease, chronic diastolic CHF and hypertension.  Past Medical History    Past Medical History:  Diagnosis Date   Anxiety    Blood dyscrasia    Chronic kidney disease    Gout    Hyperlipemia    Hypertension    LVH (left ventricular hypertrophy)    12/21/09 Echo -mild asymmetric LVH,EF =>55%   Morbid obesity (HCC)    Myocardial infarction (HCC)    Nausea vomiting and diarrhea    OSA (obstructive sleep apnea)    PAD (peripheral artery disease) (HCC)    Pneumonia    Pre-diabetes    Renal artery stenosis (HCC) 12/13/2010   PTCA and Stent - right   Seizures (HCC)    Syncopal episodes    Thrombocytosis    Type 2 diabetes mellitus (HCC) 07/20/2013   Past Surgical History:  Procedure Laterality Date   BREAST CYST EXCISION     S/P Benign Right Breast Cyst Removal.   BREAST REDUCTION SURGERY  1995   S/P Bilateral breast reduction   CESAREAN SECTION  1980, 1986   Times  Two.   CORONARY ARTERY BYPASS GRAFT N/A 04/21/2017   Procedure: CORONARY ARTERY BYPASS GRAFTING (CABG) x 2, USING LEFT MAMMARY ARTERY AND RIGHT GREATER SAPHENOUS VEIN HARVESTED ENDOSCOPICALLY;  Surgeon: Bartley Lightning, MD;  Location: [facility] OR;  Service: Open Heart Surgery;  Laterality: N/A;   EXTREMITY CYST EXCISION  1993   left wrist   LEFT HEART CATH AND CORONARY ANGIOGRAPHY N/A 04/17/2017   Procedure: Left Heart Cath and Coronary Angiography;  Surgeon: Lucendia Rusk, MD;  Location: [facility] INVASIVE CV LAB;  Service: Cardiovascular;  Laterality: N/A;   PARTIAL HYSTERECTOMY  1994   renal artery stent placement Right    TEE WITHOUT CARDIOVERSION N/A 04/21/2017   Procedure: TRANSESOPHAGEAL ECHOCARDIOGRAM  (TEE);  Surgeon: Bartley Lightning, MD;  Location: [facility] OR;  Service: Open Heart Surgery;  Laterality: N/A;   VENTRAL HERNIA REPAIR N/A 06/17/2021   Procedure: OPEN VENTRAL HERNIA REPAIR WITH MESH;  Surgeon: Kinsinger, Alphonso Aschoff, MD;  Location: [facility] ORS;  Service: General;  Laterality: N/A;    Allergies  Allergies  Allergen Reactions   Allegra Allergy [Fexofenadine Hcl] Other (See Comments)    Generally not feeling well.    Sulfa Antibiotics Itching    History of Present Illness     Sara [name] has a PMH of coronary artery disease (NSTEMI 04/15/2017 LIMA-LAD and SVG-diagonal CABG 04/21/2017), mild ischemic cardiomyopathy with EF 45-50%, peripheral arterial disease, renal artery stenosis with stent 2012, history of morbid obesity, diabetes mellitus, HLD, HTN, OSA, thrombocytopenia, and absent seizures.  She was seen in follow-up by Dr. Alvis Ba on 10/04/2023.  During that time she reported that she had lost another 10 pounds and in total had lost around 70 pounds from her peak 226 pounds about 6 years ago.  She was felt to be no longer obese.  Her BMI was down to 29.7.  She was intolerant of her CPAP and oral appliance.  She no longer had symptoms of sleep apnea.  She was getting around 7 hours of interrupted sleep.  She did note some daytime somnolence.  Her LDL was noted  to be 44, HDL 55 and hemoglobin A1c was noted to be in the prediabetes range at 6.2 without medication.  Her blood pressure was noted to be 140/84.  She denied exertional chest pain, orthopnea, PND, palpitations, claudication, lower extremity swelling, and bleeding issues.  She remained very busy taking care of her sister who had a CVA.  She was also caring for her son who has paranoid schizophrenia and her husband with lumbar spine disease and limited mobility.  She retired from [employer].  She presents to the clinic today for evaluation and states she continues to be very physically active walking 30 minutes/day.   She has been continuing to monitor her diet.  Her weight now is 154 pounds.  We reviewed her previous cardiac history and CABG.  We reviewed her most recent lipid panel.  She expressed understanding.  I will continue her current medication regimen, have her continue her current physical activity and diet.  Will plan follow-up in 9 to 12 months..  Today she denies chest pain, shortness of breath, lower extremity edema, fatigue, palpitations, melena, hematuria, hemoptysis, diaphoresis, weakness, presyncope, syncope, orthopnea, and PND.    Home Medications    Prior to Admission medications   Medication Sig Start Date End Date Taking? Authorizing Provider  amLODipine  (NORVASC ) 2.5 MG tablet Take 1 tablet (2.5 mg total) by mouth daily. 01/29/24   Cleave Curling, MD  anagrelide  ([name]) 1 MG capsule TAKE 1 CAPSULE(1 MG) BY MOUTH DAILY 02/15/24   Boscia, Heather E, NP  aspirin  EC 81 MG tablet Take 81 mg by mouth daily. Swallow whole.    [provider]  atorvastatin  (LIPITOR) 40 MG tablet Take 1 tablet (40 mg total) by mouth daily. 06/12/23   Cleave Curling, MD  Cholecalciferol  (VITAMIN D3) 2000 UNITS capsule Take 2,000 Units by mouth daily.     [provider]  glucose blood (CONTOUR TEST) test strip Use once daily to check blood sugars. 01/01/24   Cleave Curling, MD  hydrOXYzine (ATARAX) 10 MG tablet Take 10 mg by mouth 3 (three) times daily as needed. Patient not taking: Reported on 02/22/2024    [provider]  Lacosamide  150 MG TABS TAKE 1 TABLET BY MOUTH TWICE DAILY 12/21/23   Millikan, Megan, NP  Magnesium  250 MG TABS Take 250 mg by mouth 2 (two) times daily.    [provider]  metoprolol  tartrate (LOPRESSOR ) 100 MG tablet TAKE 1 TABLET(100 MG) BY MOUTH TWICE DAILY 12/21/23   Cleave Curling, MD  Prenatal Vit-Fe Fumarate-FA (PRENATAL MULTIVITAMIN/IRON PO) Take by mouth.    [provider]  telmisartan  (MICARDIS ) 80 MG tablet Take 1 tablet (80 mg total) by  mouth daily. 01/29/24   Cleave Curling, MD    Family History    Family History  Problem Relation Age of Onset   Heart failure Mother    Diabetes Father    Diabetes Sister    Hypertension Sister    Hypertension Sister    Hypertension Sister    Breast cancer Maternal Aunt        2 maternal aunts had breast cancer.   Breast cancer Maternal Grandmother    Sleep apnea Neg Hx    She indicated that her mother is deceased. She indicated that her father is deceased. She indicated that all of her three sisters are alive. She indicated that her brother is alive. She indicated that the status of her maternal grandmother is unknown. She indicated that the status of her maternal  aunt is unknown. She indicated that the status of her neg hx is unknown.  Social History    Social History   Socioeconomic History   Marital status: Married    Spouse name: Ethelle Herb   Number of children: 2   Years of education: 16   Highest education level: Not on file  Occupational History   Occupation: TRANSPORTATION COOR.    Employer: [employer]  Tobacco Use   Smoking status: Never   Smokeless tobacco: Never  Vaping Use   Vaping status: Never Used  Substance and Sexual Activity   Alcohol  use: No    Alcohol /week: 0.0 standard drinks of alcohol    Drug use: No   Sexual activity: Yes  Other Topics Concern   Not on file  Social History Narrative   Patient lives at husband and her son, home with family.   Patient has two adult children.   Patient is not drinking any caffeine.   Patient is working full-time.   Patient has a college education.   Patient is right-handed.   Social Drivers of Corporate investment banker Strain: Low Risk  (12/06/2023)   Overall Financial Resource Strain (CARDIA)    Difficulty of Paying Living Expenses: Not hard at all  Food Insecurity: No Food Insecurity (02/22/2024)   Hunger Vital Sign    Worried About Running Out of Food in the Last Year: Never true    Ran Out of  Food in the Last Year: Never true  Transportation Needs: No Transportation Needs (02/22/2024)   PRAPARE - Administrator, Civil Service (Medical): No    Lack of Transportation (Non-Medical): No  Physical Activity: Sufficiently Active (12/06/2023)   Exercise Vital Sign    Days of Exercise per Week: 7 days    Minutes of Exercise per Session: 30 min  Stress: No Stress Concern Present (12/06/2023)   [employer] of Occupational Health - Occupational Stress Questionnaire    Feeling of Stress : Not at all  Recent Concern: Stress - Stress Concern Present (11/13/2023)   [employer] of Occupational Health - Occupational Stress Questionnaire    Feeling of Stress : Rather much  Social Connections: Socially Integrated (12/06/2023)   Social Connection and Isolation Panel [NHANES]    Frequency of Communication with Friends and Family: More than three times a week    Frequency of Social Gatherings with Friends and Family: Never    Attends Religious Services: More than 4 times per year    Active Member of [employer] or Organizations: Yes    Attends Engineer, structural: More than 4 times per year    Marital Status: Married  Catering manager Violence: Not At Risk (02/06/2024)   Humiliation, Afraid, Rape, and Kick questionnaire    Fear of Current or Ex-Partner: No    Emotionally Abused: No    Physically Abused: No    Sexually Abused: No     Review of Systems    General:  No chills, fever, night sweats or weight changes.  Cardiovascular:  No chest pain, dyspnea on exertion, edema, orthopnea, palpitations, paroxysmal nocturnal dyspnea. Dermatological: No rash, lesions/masses Respiratory: No cough, dyspnea Urologic: No hematuria, dysuria Abdominal:   No nausea, vomiting, diarrhea, bright red blood per rectum, melena, or hematemesis Neurologic:  No visual changes, wkns, changes in mental status. All other systems reviewed and are otherwise negative except as noted  above.  Physical Exam    VS:  BP 118/72 (BP Location: Left Arm, Patient Position:  Sitting, Cuff Size: Normal)   Pulse 65   Ht 5\' 1"  (1.549 m)   Wt 154 lb (69.9 kg)   BMI 29.10 kg/m  , BMI Body mass index is 29.1 kg/m. GEN: Well nourished, well developed, in no acute distress. HEENT: normal. Neck: Supple, no JVD, carotid bruits, or masses. Cardiac: RRR, no murmurs, rubs, or gallops. No clubbing, cyanosis, edema.  Radials/DP/PT 2+ and equal bilaterally.  Respiratory:  Respirations regular and unlabored, clear to auscultation bilaterally. GI: Soft, nontender, nondistended, BS + x 4. MS: no deformity or atrophy. Skin: warm and dry, no rash. Neuro:  Strength and sensation are intact. Psych: Normal affect.  Accessory Clinical Findings    Recent Labs: 06/12/2023: TSH 2.390 01/29/2024: ALT 33; BUN 20; Creatinine, Ser 1.30; Potassium 5.0; Sodium 139 02/01/2024: Hemoglobin 12.8; Platelet Count 355   Recent Lipid Panel    Component Value Date/Time   CHOL 108 06/12/2023 0941   TRIG 30 06/12/2023 0941   HDL 55 06/12/2023 0941   CHOLHDL 2.0 06/12/2023 0941   CHOLHDL 2.7 04/17/2017 1000   VLDL 8 04/17/2017 1000   LDLCALC 44 06/12/2023 0941         ECG personally reviewed by me today- EKG Interpretation Date/Time:  Tuesday Mar 12 2024 09:51:07 EDT Ventricular Rate:  65 PR Interval:  222 QRS Duration:  82 QT Interval:  398 QTC Calculation: 413 R Axis:   -5  Text Interpretation: Sinus rhythm with 1st degree A-V block Septal infarct , age undetermined When compared with ECG of 22-Jan-2021 15:50, PREVIOUS ECG IS PRESENT Confirmed by Lawana Pray [phone]) on 03/12/2024 9:52:16 AM    Echocardiogram 05/22/2023  IMPRESSIONS     1. Left ventricular ejection fraction, by estimation, is 60 to 65%. The  left ventricle has normal function. The left ventricle has no regional  wall motion abnormalities. Left ventricular diastolic parameters are  consistent with Grade II diastolic   dysfunction (pseudonormalization). Elevated left atrial pressure. The  average left ventricular global longitudinal strain is -15.2 %. The global  longitudinal strain is abnormal.   2. Right ventricular systolic function is normal. The right ventricular  size is normal. There is normal pulmonary artery systolic pressure. The  estimated right ventricular systolic pressure is 27.6 mmHg.   3. Left atrial size was mildly dilated.   4. The mitral valve is normal in structure. No evidence of mitral valve  regurgitation. No evidence of mitral stenosis.   5. The aortic valve is normal in structure. Aortic valve regurgitation is  not visualized. No aortic stenosis is present.   6. There is Moderate (Grade III) protruding plaque involving the aortic  arch and descending aorta.   7. The inferior vena cava is normal in size with greater than 50%  respiratory variability, suggesting right atrial pressure of 3 mmHg.   Comparison(s): Prior images unable to be directly viewed, comparison made  by report only. The left ventricular function has improved. The left  ventricular diastolic function is significantly worse. (There is now  elevated mean left atrial pressure) The  left ventricular wall motion abnormalities are improved.   FINDINGS   Left Ventricle: Left ventricular ejection fraction, by estimation, is 60  to 65%. The left ventricle has normal function. The left ventricle has no  regional wall motion abnormalities. Mild hypokinesis of the left  ventricular, apical inferior wall. The  average left ventricular global longitudinal strain is -15.2 %. The global  longitudinal strain is abnormal. The left ventricular internal cavity size  was normal in size. There is no left ventricular hypertrophy. Left  ventricular diastolic parameters are  consistent with Grade II diastolic dysfunction (pseudonormalization).  Elevated left atrial pressure.   Right Ventricle: The right ventricular size is  normal. No increase in  right ventricular wall thickness. Right ventricular systolic function is  normal. There is normal pulmonary artery systolic pressure. The tricuspid  regurgitant velocity is 2.48 m/s, and   with an assumed right atrial pressure of 3 mmHg, the estimated right  ventricular systolic pressure is 27.6 mmHg.   Left Atrium: Left atrial size was mildly dilated.   Right Atrium: Right atrial size was normal in size.   Pericardium: There is no evidence of pericardial effusion.   Mitral Valve: The mitral valve is normal in structure. No evidence of  mitral valve regurgitation. No evidence of mitral valve stenosis. MV peak  gradient, 3.6 mmHg. The mean mitral valve gradient is 1.0 mmHg.   Tricuspid Valve: The tricuspid valve is normal in structure. Tricuspid  valve regurgitation is trivial. No evidence of tricuspid stenosis.   Aortic Valve: The aortic valve is normal in structure. Aortic valve  regurgitation is not visualized. No aortic stenosis is present. Aortic  valve mean gradient measures 7.0 mmHg. Aortic valve peak gradient measures  12.4 mmHg. Aortic valve area, by VTI  measures 2.47 cm.   Pulmonic Valve: The pulmonic valve was normal in structure. Pulmonic valve  regurgitation is not visualized. No evidence of pulmonic stenosis.   Aorta: The aortic root is normal in size and structure. There is moderate  (Grade III) protruding plaque involving the aortic arch and descending  aorta.   Venous: The inferior vena cava is normal in size with greater than 50%  respiratory variability, suggesting right atrial pressure of 3 mmHg.   IAS/Shunts: There is right bowing of the interatrial septum, suggestive of  elevated left atrial pressure. No atrial level shunt detected by color  flow Doppler.          Assessment & Plan   1.  Coronary artery disease-denies exertional chest pain or anginal type symptoms.  NSTEMI 6/18 with LIMA-LAD and SVG-diagonal bypass  6/18. Heart healthy low-sodium diet Maintain physical activity Continue aspirin , atorvastatin   Hyperlipidemia-LDL 44 on 06/12/23. Continue atorvastatin , aspirin  High-fiber diet  Chronic diastolic CHF-denies increased DOE or activity intolerance.  Echocardiogram 05/22/2023 showed LVEF of 60 to 65% and G2 DD. Heart healthy low-sodium diet Daily weights Elevate lower extremities when not active Continue telmisartan , metoprolol   Essential hypertension-BP today 118/72. Maintain blood pressure log Heart healthy low-sodium diet-salty 6 diet sheet given Continue amlodipine , metoprolol , telmisartan   OSA-was intolerant of CPAP and oral appliance.  Does note some daytime somnolence.  Sleeping well for about 7 hours uninterrupted. Sleep hygiene instructions given Avoid supine sleeping  History of renal artery stenosis-had a renal artery stenting in 2012.  Disposition: Follow-up with Dr. Alvis Ba or me in 9-12 months.   Chet Cota. Lenaya Pietsch NP-C     03/12/2024, 9:55 AM [facility] [address] Office [phone] Fax [phone]    I spent 14 minutes examining this patient, reviewing medications, and using patient centered shared decision making involving their cardiac care.   I spent  20 minutes reviewing past medical history,  medications, and prior cardiac tests.

## 2024-03-11 DIAGNOSIS — M65331 Trigger finger, right middle finger: Secondary | ICD-10-CM | POA: Diagnosis not present

## 2024-03-12 ENCOUNTER — Ambulatory Visit: Attending: General Practice | Admitting: General Practice

## 2024-03-12 ENCOUNTER — Encounter: Payer: Self-pay | Admitting: General Practice

## 2024-03-12 VITALS — BP 118/72 | HR 65 | Ht 61.0 in | Wt 154.0 lb

## 2024-03-12 DIAGNOSIS — I5032 Chronic diastolic (congestive) heart failure: Secondary | ICD-10-CM

## 2024-03-12 DIAGNOSIS — I10 Essential (primary) hypertension: Secondary | ICD-10-CM

## 2024-03-12 DIAGNOSIS — I2510 Atherosclerotic heart disease of native coronary artery without angina pectoris: Secondary | ICD-10-CM

## 2024-03-12 DIAGNOSIS — G4733 Obstructive sleep apnea (adult) (pediatric): Secondary | ICD-10-CM

## 2024-03-12 DIAGNOSIS — E7800 Pure hypercholesterolemia, unspecified: Secondary | ICD-10-CM | POA: Diagnosis not present

## 2024-03-12 DIAGNOSIS — Z8679 Personal history of other diseases of the circulatory system: Secondary | ICD-10-CM

## 2024-03-12 NOTE — Patient Instructions (Signed)
 Medication Instructions:  NO CHANGES   Lab Work: NONE  Testing/Procedures: NONE  Follow-Up: At [facility], you and your health needs are our priority.  As part of our continuing mission to provide you with exceptional heart care, our providers are all part of one team.  This team includes your primary Cardiologist (physician) and Advanced Practice Providers or APPs (Physician Assistants and Nurse Practitioners) who all work together to provide you with the care you need, when you need it.  Your next appointment:   1 year(s)  Provider:   Luana Rumple, MD OR Lawana Pray   Other Instructions INCREASE YOUR PHYSICAL ACTIVITY AND EXERCISE.

## 2024-03-16 DIAGNOSIS — F411 Generalized anxiety disorder: Secondary | ICD-10-CM | POA: Diagnosis not present

## 2024-03-19 NOTE — Patient Outreach (Signed)
 Complex Care Management   Visit Note  03/08/2024  Name:  Sara [name] MRN: [id number] DOB: May 06, 1956  Situation: Referral received for Complex Care Management related to Stress I obtained verbal consent from Patient.  Visit completed with pt  on the phone  Background:   Past Medical History:  Diagnosis Date   Anxiety    Blood dyscrasia    Chronic kidney disease    Gout    Hyperlipemia    Hypertension    LVH (left ventricular hypertrophy)    12/21/09 Echo -mild asymmetric LVH,EF =>55%   Morbid obesity (HCC)    Myocardial infarction (HCC)    Nausea vomiting and diarrhea    OSA (obstructive sleep apnea)    PAD (peripheral artery disease) (HCC)    Pneumonia    Pre-diabetes    Renal artery stenosis (HCC) 12/13/2010   PTCA and Stent - right   Seizures (HCC)    Syncopal episodes    Thrombocytosis    Type 2 diabetes mellitus (HCC) 07/20/2013    Assessment: Patient Reported Symptoms:  Cognitive Cognitive Status: Alert and oriented to person, place, and time, Normal speech and language skills Cognitive/Intellectual Conditions Management [RPT]: None reported or documented in medical history or problem list      Neurological Neurological Review of Symptoms: No symptoms reported Neurological Conditions: Seizures Neurological Management Strategies: Routine screening  HEENT HEENT Symptoms Reported: No symptoms reported      Cardiovascular Cardiovascular Symptoms Reported: No symptoms reported    Respiratory Respiratory Symptoms Reported: No symptoms reported    Endocrine Patient reports the following symptoms related to hypoglycemia or hyperglycemia : No symptoms reported Is patient diabetic?: Yes Endocrine Conditions: Diabetes Endocrine Management Strategies: Routine screening, Medication therapy, Diet modification, Exercise  Gastrointestinal Gastrointestinal Symptoms Reported: No symptoms reported      Genitourinary Genitourinary Symptoms Reported: No symptoms  reported Genitourinary Conditions: Chronic kidney disease  Integumentary Integumentary Symptoms Reported: No symptoms reported    Musculoskeletal Musculoskelatal Symptoms Reviewed: No symptoms reported        Psychosocial Psychosocial Symptoms Reported: Other Other Psychosocial Conditions: Stress Additional Psychological Details: Pt endorses stress from process of attempting to place adult son in a local group home. Process discussed and pt was encouraged to contact PCP and/or LCSW when a FL2 is needed Behavioral Management Strategies: [employer], Support system, Coping strategies Major Change/Loss/Stressor/Fears (CP): Medical condition, self, Medical condition, family Quality of Family Relationships: stressful, involved Do you feel physically threatened by others?: No      02/22/2024    1:44 PM  Depression screen PHQ 2/9  Decreased Interest 0  Down, Depressed, Hopeless 0  PHQ - 2 Score 0    There were no vitals filed for this visit.  Medications Reviewed Today     Reviewed by Adriana Albany, LCSW (Social Worker) on 03/19/24 at 1107  Med List Status: <None>   Medication Order Taking? Sig Documenting Provider Last Dose Status Informant  amLODipine  (NORVASC ) 2.5 MG tablet [id number] No Take 1 tablet (2.5 mg total) by mouth daily. Cleave Curling, MD Taking Active   anagrelide  ([name]) 1 MG capsule [id number] No TAKE 1 CAPSULE(1 MG) BY MOUTH DAILY Boscia, Heather E, NP Taking Active   aspirin  EC 81 MG tablet [id number] No Take 81 mg by mouth daily. Swallow whole. [provider] Taking Active Self  atorvastatin  (LIPITOR) 40 MG tablet [id number] No Take 1 tablet (40 mg total) by mouth daily. Cleave Curling, MD Taking Active   Cholecalciferol  (VITAMIN D3) 2000  UNITS capsule [id number] No Take 2,000 Units by mouth daily.  [provider] Taking Active Self  glucose blood (CONTOUR TEST) test strip [id number] No Use once daily to check blood sugars. Cleave Curling,  MD Taking Active   Lacosamide  150 MG TABS [id number] No TAKE 1 TABLET BY MOUTH TWICE DAILY Millikan, Megan, NP Taking Active   Magnesium  250 MG TABS [id number] No Take 250 mg by mouth 2 (two) times daily. [provider] Taking Active Self  metoprolol  tartrate (LOPRESSOR ) 100 MG tablet [id number] No TAKE 1 TABLET(100 MG) BY MOUTH TWICE DAILY Cleave Curling, MD Taking Active   Prenatal Vit-Fe Fumarate-FA (PRENATAL MULTIVITAMIN/IRON PO) [id number] No Take by mouth. [provider] Taking Active   telmisartan  (MICARDIS ) 80 MG tablet [id number] No Take 1 tablet (80 mg total) by mouth daily. Cleave Curling, MD Taking Active             Recommendation:   Continue Current Plan of Care  Follow Up Plan:   Telephone follow-up in 1 month  Alease Hunter, LCSW [facility]  [facility], [facility] Clinical Social Worker Direct Dial: [phone]  Fax: [phone] Website: [email or web address] 11:13 AM

## 2024-03-19 NOTE — Patient Instructions (Signed)
 Visit Information  Thank you for taking time to visit with me today. Please don't hesitate to contact me if I can be of assistance to you before our next scheduled appointment.  Your next care management appointment is by telephone on 06/20 at 3:30 PM  Please call the care guide team at [phone] if you need to cancel, schedule, or reschedule an appointment.   Please call the Suicide and Crisis Lifeline: [id number] go to [facility] Urgent [facility] [address], [place] [phone]) call 911 if you are experiencing a Mental Health or Behavioral Health Crisis or need someone to talk to.  Alease Hunter, LCSW   [facility], [facility] Clinical Social Worker Direct Dial: [phone]  Fax: [phone] Website: [email or web address] 11:13 AM

## 2024-03-26 ENCOUNTER — Telehealth: Payer: Self-pay

## 2024-03-28 ENCOUNTER — Telehealth: Payer: Self-pay

## 2024-03-28 NOTE — Progress Notes (Signed)
 Complex Care Management Care Guide Note  03/28/2024 Name: MARIADELCARMEN CORELLA MRN: [id number] DOB: 01-Aug-1956  Arlice Lai is a 68 y.o. year old female who is a primary care patient of Cleave Curling, MD and is actively engaged with the care management team. I reached out to Arlice Lai by phone today to assist with re-scheduling  with the RN Case Manager.  Follow up plan: Telephone appointment with complex care management team member scheduled for:  05-02-24  Creola Doheny [facility], [facility] Guide  Direct Dial: [phone]  Fax [phone]

## 2024-04-02 ENCOUNTER — Ambulatory Visit: Admitting: Orthopedic Surgery

## 2024-04-12 ENCOUNTER — Telehealth: Payer: Self-pay | Admitting: Licensed Clinical Social Worker

## 2024-04-25 ENCOUNTER — Other Ambulatory Visit: Payer: Self-pay | Admitting: Licensed Clinical Social Worker

## 2024-05-01 NOTE — Assessment & Plan Note (Signed)
-  JAK2(+) --diagnosed in 2010, JAK2 mutation present. Bone marrow aspirate and biopsy on 01/20/09 was non-diagnostic. However, megakaryocytes were abundant with normal morphology. There was no clustering or excess blasts seen. Platelet count on 01/20/09 was 881k.  -started Hydrea in 2010, dose adjusted as needed. Switched to anagrelide in 07/2016 due to concern about risk of leukemia. She is currently on 1mg daily, since 01/28/21, tolerating well with no noticeable side effects. 

## 2024-05-02 ENCOUNTER — Inpatient Hospital Stay: Payer: Medicare PPO

## 2024-05-02 ENCOUNTER — Inpatient Hospital Stay: Payer: Medicare PPO | Attending: Nurse Practitioner | Admitting: Hematology

## 2024-05-02 ENCOUNTER — Telehealth: Payer: Self-pay

## 2024-05-02 VITALS — BP 160/64 | HR 69 | Temp 98.4°F | Resp 14 | Ht 61.0 in | Wt 160.6 lb

## 2024-05-02 DIAGNOSIS — E663 Overweight: Secondary | ICD-10-CM | POA: Insufficient documentation

## 2024-05-02 DIAGNOSIS — I252 Old myocardial infarction: Secondary | ICD-10-CM | POA: Insufficient documentation

## 2024-05-02 DIAGNOSIS — D473 Essential (hemorrhagic) thrombocythemia: Secondary | ICD-10-CM

## 2024-05-02 DIAGNOSIS — Z130 Encounter for screening for diseases of the blood and blood-forming organs and certain disorders involving the immune mechanism: Secondary | ICD-10-CM

## 2024-05-02 LAB — CBC WITH DIFFERENTIAL (CANCER CENTER ONLY)
Abs Immature Granulocytes: 0.03 K/uL (ref 0.00–0.07)
Basophils Absolute: 0.1 K/uL (ref 0.0–0.1)
Basophils Relative: 1 %
Eosinophils Absolute: 0.3 K/uL (ref 0.0–0.5)
Eosinophils Relative: 4 %
HCT: 38.8 % (ref 36.0–46.0)
Hemoglobin: 13 g/dL (ref 12.0–15.0)
Immature Granulocytes: 0 %
Lymphocytes Relative: 18 %
Lymphs Abs: 1.4 K/uL (ref 0.7–4.0)
MCH: 29.6 pg (ref 26.0–34.0)
MCHC: 33.5 g/dL (ref 30.0–36.0)
MCV: 88.4 fL (ref 80.0–100.0)
Monocytes Absolute: 0.8 K/uL (ref 0.1–1.0)
Monocytes Relative: 10 %
Neutro Abs: 5.3 K/uL (ref 1.7–7.7)
Neutrophils Relative %: 67 %
Platelet Count: 440 K/uL — ABNORMAL HIGH (ref 150–400)
RBC: 4.39 MIL/uL (ref 3.87–5.11)
RDW: 14.5 % (ref 11.5–15.5)
WBC Count: 7.9 K/uL (ref 4.0–10.5)
nRBC: 0 % (ref 0.0–0.2)

## 2024-05-02 NOTE — Progress Notes (Signed)
 [facility]   Telephone:([phone] Fax:([phone]) [phone]   Clinic Follow up Note   Patient Care Team: Jarold Medici, MD as PCP - General (Internal Medicine) Croitoru, Jerel, MD as PCP - Cardiology (Cardiology) Arnell Rockney PARAS, RPH (Inactive) (Pharmacist) Lanny Callander, MD as Consulting Physician (Hematology and Oncology) Ezzard Rolin BIRCH, LCSW as VBCI Care Management (Licensed Clinical Social Worker) [facility] Little, Clayborne CROME, RN as VBCI Care Management  Date of Service:  05/02/2024  CHIEF COMPLAINT: f/u of ET  CURRENT THERAPY:  Anagrelide  1 g daily  Oncology History   Essential thrombocythemia (HCC) -JAK2(+) --diagnosed in 2010, JAK2 mutation present. Bone marrow aspirate and biopsy on 01/20/09 was non-diagnostic. However, megakaryocytes were abundant with normal morphology. There was no clustering or excess blasts seen. Platelet count on 01/20/09 was 881k.  -started Hydrea  in 2010, dose adjusted as needed. Switched to anagrelide  in 07/2016 due to concern about risk of leukemia. She is currently on 1mg  daily, since 01/28/21, tolerating well with no noticeable side effects.    Assessment & Plan Essential Thrombocythemia Essential thrombocythemia is well-managed with a current platelet count of 440,000, below the threshold of 500,000. Anagrelide  is effective in maintaining platelet levels within acceptable limits. She is experiencing significant stress due to family issues, which could potentially impact her condition. - Continue anagrelide  at current dosage of one capsule daily - Monitor platelet count with lab tests every three months - Schedule follow-up appointment in six months - Advise stress management to prevent exacerbation of condition  Overweight She is attempting to manage her weight, aiming to maintain it around 150 pounds. Recent weight increase to 160 pounds is likely due to stress-related eating, particularly sweets.  Stress management is crucial as it may contribute to weight gain and impact overall health. - Advise monitoring diet and reducing intake of sweets - Encourage regular physical activity and walking as a form of exercise - Discuss stress management techniques to prevent stress-related eating  Plan - Lab reviewed, platelet 440, controlled, will continue same dose anagrelide  - Lab every 3 months, follow-up in 6 months   Discussed the use of AI scribe software for clinical note transcription with the patient, who gave verbal consent to proceed.  History of Present Illness Sara [name] is a 68 year old female with essential thrombocythemia who presents for follow-up.  She is taking anagrelide , one capsule daily, with a current platelet count of 440. Her platelet count was normal three months ago. She has a three-month supply of medication remaining.  She is experiencing significant stress due to family responsibilities, including her son's hospitalization for behavioral health issues and caring for her father. She also has other family obligations, including a sister in a nursing home and another sister who had a stroke. A cousin with dementia has been missing for several days.  She is managing her weight, aiming to stay around 150 pounds, but stress has led to increased consumption of sweets, resulting in a weight increase to 160 pounds.  She has a history of heart problems, including a previous heart attack, and is concerned about the impact of stress on her health. She is trying to maintain her mental health by having quiet time, reading the Bible, and considering counseling support.     All other systems were reviewed with the patient and are negative.  MEDICAL HISTORY:  Past Medical History:  Diagnosis Date   Anxiety    Blood dyscrasia    Chronic kidney  disease    Gout    Hyperlipemia    Hypertension    LVH (left ventricular hypertrophy)    12/21/09 Echo -mild asymmetric LVH,EF  =>55%   Morbid obesity (HCC)    Myocardial infarction (HCC)    Nausea vomiting and diarrhea    OSA (obstructive sleep apnea)    PAD (peripheral artery disease) (HCC)    Pneumonia    Pre-diabetes    Renal artery stenosis (HCC) 12/13/2010   PTCA and Stent - right   Seizures (HCC)    Syncopal episodes    Thrombocytosis    Type 2 diabetes mellitus (HCC) 07/20/2013    SURGICAL HISTORY: Past Surgical History:  Procedure Laterality Date   BREAST CYST EXCISION     S/P Benign Right Breast Cyst Removal.   BREAST REDUCTION SURGERY  1995   S/P Bilateral breast reduction   CESAREAN SECTION  1980, 1986   Times  Two.   CORONARY ARTERY BYPASS GRAFT N/A 04/21/2017   Procedure: CORONARY ARTERY BYPASS GRAFTING (CABG) x 2, USING LEFT MAMMARY ARTERY AND RIGHT GREATER SAPHENOUS VEIN HARVESTED ENDOSCOPICALLY;  Surgeon: Lucas Dorise POUR, MD;  Location: [facility] OR;  Service: Open Heart Surgery;  Laterality: N/A;   EXTREMITY CYST EXCISION  1993   left wrist   LEFT HEART CATH AND CORONARY ANGIOGRAPHY N/A 04/17/2017   Procedure: Left Heart Cath and Coronary Angiography;  Surgeon: Dann Candyce RAMAN, MD;  Location: [facility] INVASIVE CV LAB;  Service: Cardiovascular;  Laterality: N/A;   PARTIAL HYSTERECTOMY  1994   renal artery stent placement Right    TEE WITHOUT CARDIOVERSION N/A 04/21/2017   Procedure: TRANSESOPHAGEAL ECHOCARDIOGRAM (TEE);  Surgeon: Lucas Dorise POUR, MD;  Location: [facility] OR;  Service: Open Heart Surgery;  Laterality: N/A;   VENTRAL HERNIA REPAIR N/A 06/17/2021   Procedure: OPEN VENTRAL HERNIA REPAIR WITH MESH;  Surgeon: Kinsinger, Herlene Righter, MD;  Location: [facility] ORS;  Service: General;  Laterality: N/A;    I have reviewed the social history and family history with the patient and they are unchanged from previous note.  ALLERGIES:  is allergic to allegra allergy [fexofenadine hcl] and sulfa antibiotics.  MEDICATIONS:  Current Outpatient Medications  Medication Sig Dispense Refill   amLODipine   (NORVASC ) 2.5 MG tablet Take 1 tablet (2.5 mg total) by mouth daily. 90 tablet 2   anagrelide  ([name]) 1 MG capsule TAKE 1 CAPSULE(1 MG) BY MOUTH DAILY 30 capsule 5   aspirin  EC 81 MG tablet Take 81 mg by mouth daily. Swallow whole.     atorvastatin  (LIPITOR) 40 MG tablet Take 1 tablet (40 mg total) by mouth daily. 90 tablet 3   Cholecalciferol  (VITAMIN D3) 2000 UNITS capsule Take 2,000 Units by mouth daily.      glucose blood (CONTOUR TEST) test strip Use once daily to check blood sugars. 100 each 1   Lacosamide  150 MG TABS TAKE 1 TABLET BY MOUTH TWICE DAILY 60 tablet 4   Magnesium  250 MG TABS Take 250 mg by mouth 2 (two) times daily.     metoprolol  tartrate (LOPRESSOR ) 100 MG tablet TAKE 1 TABLET(100 MG) BY MOUTH TWICE DAILY 180 tablet 3   Prenatal Vit-Fe Fumarate-FA (PRENATAL MULTIVITAMIN/IRON PO) Take by mouth.     telmisartan  (MICARDIS ) 80 MG tablet Take 1 tablet (80 mg total) by mouth daily. 90 tablet 2   No current facility-administered medications for this visit.    PHYSICAL EXAMINATION: ECOG PERFORMANCE STATUS: 0 - Asymptomatic  Vitals:   05/02/24 1333  BP: (!) 160/64  Pulse: 69  Resp: 14  Temp: 98.4 F (36.9 C)  SpO2: 99%   Wt Readings from Last 3 Encounters:  05/02/24 72.8 kg (160 lb 9.6 oz)  03/12/24 69.9 kg (154 lb)  03/06/24 70.3 kg (155 lb)     GENERAL:alert, no distress and comfortable SKIN: skin color, texture, turgor are normal, no rashes or significant lesions EYES: normal, Conjunctiva are pink and non-injected, sclera clear Musculoskeletal:no cyanosis of digits and no clubbing  NEURO: alert & oriented x 3 with fluent speech, no focal motor/sensory deficits  Physical Exam    LABORATORY DATA:  I have reviewed the data as listed    Latest Ref Rng & Units 05/02/2024    1:05 PM 02/01/2024    1:34 PM 12/12/2023    1:45 PM  CBC  WBC 4.0 - 10.5 K/uL 7.9  7.3  8.4   Hemoglobin 12.0 - 15.0 g/dL 86.9  87.1  87.1   Hematocrit 36.0 - 46.0 % 38.8  38.2  39.2    Platelets 150 - 400 K/uL 440  355  452         Latest Ref Rng & Units 01/29/2024   12:47 PM 10/16/2023    4:30 PM 06/12/2023    9:41 AM  CMP  Glucose 70 - 99 mg/dL 89  863  62   BUN 8 - 27 mg/dL 20  27  20    Creatinine 0.57 - 1.00 mg/dL 8.69  8.69  8.82   Sodium 134 - 144 mmol/L 139  137  137   Potassium 3.5 - 5.2 mmol/L 5.0  5.0  4.6   Chloride 96 - 106 mmol/L 101  97  100   CO2 20 - 29 mmol/L 24  26  26    Calcium  8.7 - 10.3 mg/dL 89.5  89.2  89.8   Total Protein 6.0 - 8.5 g/dL 6.7   6.3   Total Bilirubin 0.0 - 1.2 mg/dL 0.5   0.4   Alkaline Phos 44 - 121 IU/L 106   94   AST 0 - 40 IU/L 28   22   ALT 0 - 32 IU/L 33   29       RADIOGRAPHIC STUDIES: I have personally reviewed the radiological images as listed and agreed with the findings in the report. No results found.    No orders of the defined types were placed in this encounter.  All questions were answered. The patient knows to call the clinic with any problems, questions or concerns. No barriers to learning was detected. The total time spent in the appointment was 15 minutes, including review of chart and various tests results, discussions about plan of care and coordination of care plan     Onita Mattock, MD 05/02/2024

## 2024-05-02 NOTE — Patient Instructions (Signed)
 Visit Information  Thank you for taking time to visit with me today. Please don't hesitate to contact me if I can be of assistance to you before our next scheduled appointment.  Your next care management appointment is by telephone on 07/17 at 12:30 PM   Please call the care guide team at [phone] if you need to cancel, schedule, or reschedule an appointment.   Please call the Suicide and Crisis Lifeline: 988 go to [facility] Urgent [facility] [address] [place] [phone]) call 911 if you are experiencing a Mental Health or Behavioral Health Crisis or need someone to talk to.  Rolin Kerns, LCSW [place]  [facility], [facility] Clinical Social Worker Direct Dial: [phone]  Fax: [phone] Website: [email or web address] 4:15 PM

## 2024-05-02 NOTE — Patient Outreach (Signed)
 Complex Care Management   Visit Note  04/25/2024  Name:  Sara [name] MRN: [id number] DOB: [birth year]  Situation: Referral received for Complex Care Management related to Stress I obtained verbal consent from Patient.  Visit completed with pt  on the phone  Background:   Past Medical History:  Diagnosis Date   Anxiety    Blood dyscrasia    Chronic kidney disease    Gout    Hyperlipemia    Hypertension    LVH (left ventricular hypertrophy)    12/21/09 Echo -mild asymmetric LVH,EF =>55%   Morbid obesity (HCC)    Myocardial infarction (HCC)    Nausea vomiting and diarrhea    OSA (obstructive sleep apnea)    PAD (peripheral artery disease) (HCC)    Pneumonia    Pre-diabetes    Renal artery stenosis (HCC) 12/13/2010   PTCA and Stent - right   Seizures (HCC)    Syncopal episodes    Thrombocytosis    Type 2 diabetes mellitus (HCC) 07/20/2013    Assessment: Patient Reported Symptoms:  Cognitive Cognitive Status: Alert and oriented to person, place, and time, Normal speech and language skills Cognitive/Intellectual Conditions Management [RPT]: None reported or documented in medical history or problem list      Neurological Neurological Review of Symptoms: No symptoms reported    HEENT HEENT Symptoms Reported: No symptoms reported      Cardiovascular Cardiovascular Symptoms Reported: No symptoms reported    Respiratory Respiratory Symptoms Reported: No symptoms reported    Endocrine Endocrine Symptoms Reported: No symptoms reported    Gastrointestinal Gastrointestinal Symptoms Reported: No symptoms reported      Genitourinary Genitourinary Symptoms Reported: No symptoms reported    Integumentary Integumentary Symptoms Reported: No symptoms reported    Musculoskeletal Musculoskelatal Symptoms Reviewed: No symptoms reported        Psychosocial Psychosocial Symptoms Reported: Other Other Psychosocial Conditions: Stress Additional Psychological Details: Pt  identified factors that has negatively impacted stress. Strategies to assist with mindfulness and boundary setting discussed to promote management of symptoms Behavioral Management Strategies: Coping strategies, Support system Major Change/Loss/Stressor/Fears (CP): Medical condition, self, Medical condition, family Techniques to [place] with Loss/Stress/Change: Diversional activities        05/02/2024    1:34 PM  Depression screen PHQ 2/9  Decreased Interest 0  Down, Depressed, Hopeless 0  PHQ - 2 Score 0    There were no vitals filed for this visit.  Medications Reviewed Today     Reviewed by Sara Pruiett D, LCSW (Social Worker) on 05/02/24 at 1609  Med List Status: <None>   Medication Order Taking? Sig Documenting Provider Last Dose Status Informant  amLODipine  (NORVASC ) 2.5 MG tablet [id number]  Take 1 tablet (2.5 mg total) by mouth daily. Jarold Medici, MD  Active   anagrelide  ([name]) 1 MG capsule [id number]  TAKE 1 CAPSULE(1 MG) BY MOUTH DAILY Boscia, Heather E, NP  Active   aspirin  EC 81 MG tablet [id number]  Take 81 mg by mouth daily. Swallow whole. [provider]  Active Self  atorvastatin  (LIPITOR) 40 MG tablet [id number]  Take 1 tablet (40 mg total) by mouth daily. Jarold Medici, MD  Active   Cholecalciferol  (VITAMIN D3) 2000 UNITS capsule [id number]  Take 2,000 Units by mouth daily.  [provider]  Active Self  glucose blood (CONTOUR TEST) test strip [id number]  Use once daily to check blood sugars. Jarold Medici, MD  Active   Lacosamide  150 MG TABS [id number]  TAKE 1 TABLET  BY MOUTH TWICE DAILY Millikan, Megan, NP  Active   Magnesium  250 MG TABS [id number]  Take 250 mg by mouth 2 (two) times daily. [provider]  Active Self  metoprolol  tartrate (LOPRESSOR ) 100 MG tablet [id number]  TAKE 1 TABLET(100 MG) BY MOUTH TWICE DAILY Jarold Medici, MD  Active   Prenatal Vit-Fe Fumarate-FA (PRENATAL MULTIVITAMIN/IRON PO) [id number]  Take by mouth. [provider]  Active   telmisartan  (MICARDIS ) 80 MG tablet [id number]  Take 1 tablet (80 mg total) by mouth daily. Jarold Medici, MD  Active             Recommendation:   Continue Current Plan of Care  Follow Up Plan:   Telephone follow-up 2-4 weeks  Rolin Kerns, LCSW [facility]  [facility], [facility] Clinical Social Worker Direct Dial: [phone]  Fax: [phone] Website: [email or web address] 4:15 PM

## 2024-05-07 ENCOUNTER — Telehealth: Payer: Self-pay

## 2024-05-07 NOTE — Progress Notes (Unsigned)
 Complex Care Management Care Guide Note  05/07/2024 Name: [name] MRN: [id number] DOB: 1956/05/07  Sara [name] is a 68 y.o. year old female who is a primary care patient of Jarold Medici, MD and is actively engaged with the care management team. I reached out to Sara [name] by phone today to assist with re-scheduling  with the RN Case Manager.  Follow up plan: Unsuccessful telephone outreach attempt made. A HIPAA compliant phone message was left for the patient providing contact information and requesting a return call.  Leotis Rase [facility], [facility] Guide  Direct Dial: [phone]  Fax [phone]

## 2024-05-09 ENCOUNTER — Telehealth: Payer: Self-pay | Admitting: Licensed Clinical Social Worker

## 2024-05-10 ENCOUNTER — Other Ambulatory Visit: Admitting: Licensed Clinical Social Worker

## 2024-05-20 ENCOUNTER — Other Ambulatory Visit: Payer: Self-pay | Admitting: Adult Health

## 2024-05-20 DIAGNOSIS — R569 Unspecified convulsions: Secondary | ICD-10-CM

## 2024-05-20 NOTE — Patient Instructions (Signed)
 Visit Information  Thank you for taking time to visit with me today. Please don't hesitate to contact me if I can be of assistance to you before our next scheduled appointment.  Your next care management appointment is by telephone on 08/01 at 12:30 PM  Please call the care guide team at [phone] if you need to cancel, schedule, or reschedule an appointment.   Please call the Suicide and Crisis Lifeline: 988 go to [facility] Urgent [facility] [address], [place] [phone]) call 911 if you are experiencing a Mental Health or Behavioral Health Crisis or need someone to talk to.  Rolin Kerns, LCSW [place]  [facility], [facility] Clinical Social Worker Direct Dial: [phone]  Fax: [phone] Website: [email or web address] 10:20 AM

## 2024-05-20 NOTE — Patient Outreach (Signed)
 Complex Care Management   Visit Note  05/10/2024  Name:  Sara [name] MRN: [id number] DOB: 01/21/1956  Situation: Referral received for Complex Care Management related to Stress Management and Grief I obtained verbal consent from Patient.  Visit completed with pt  on the phone  Background:   Past Medical History:  Diagnosis Date   Anxiety    Blood dyscrasia    Chronic kidney disease    Gout    Hyperlipemia    Hypertension    LVH (left ventricular hypertrophy)    12/21/09 Echo -mild asymmetric LVH,EF =>55%   Morbid obesity (HCC)    Myocardial infarction (HCC)    Nausea vomiting and diarrhea    OSA (obstructive sleep apnea)    PAD (peripheral artery disease) (HCC)    Pneumonia    Pre-diabetes    Renal artery stenosis (HCC) 12/13/2010   PTCA and Stent - right   Seizures (HCC)    Syncopal episodes    Thrombocytosis    Type 2 diabetes mellitus (HCC) 07/20/2013    Assessment: Patient Reported Symptoms:  Cognitive Cognitive Status: Alert and oriented to person, place, and time, Normal speech and language skills Cognitive/Intellectual Conditions Management [RPT]: None reported or documented in medical history or problem list      Neurological Neurological Review of Symptoms: Not assessed    HEENT HEENT Symptoms Reported: Not assessed      Cardiovascular Cardiovascular Symptoms Reported: Not assessed    Respiratory Respiratory Symptoms Reported: Not assesed    Endocrine Endocrine Symptoms Reported: Not assessed    Gastrointestinal Gastrointestinal Symptoms Reported: Not assessed      Genitourinary Genitourinary Symptoms Reported: Not assessed    Integumentary Integumentary Symptoms Reported: Not assessed    Musculoskeletal Musculoskelatal Symptoms Reviewed: Not assessed        Psychosocial Psychosocial Symptoms Reported: Other Other Psychosocial Conditions: Stress and Grief            05/02/2024    1:34 PM  Depression screen PHQ 2/9  Decreased  Interest 0  Down, Depressed, Hopeless 0  PHQ - 2 Score 0    There were no vitals filed for this visit.  Medications Reviewed Today     Reviewed by Khalani Novoa D, LCSW (Social Worker) on 05/20/24 at 1015  Med List Status: <None>   Medication Order Taking? Sig Documenting Provider Last Dose Status Informant  amLODipine  (NORVASC ) 2.5 MG tablet [id number]  Take 1 tablet (2.5 mg total) by mouth daily. Jarold Medici, MD  Active   anagrelide  ([name]) 1 MG capsule [id number]  TAKE 1 CAPSULE(1 MG) BY MOUTH DAILY Boscia, Heather E, NP  Active   aspirin  EC 81 MG tablet [id number]  Take 81 mg by mouth daily. Swallow whole. [provider]  Active Self  atorvastatin  (LIPITOR) 40 MG tablet [id number]  Take 1 tablet (40 mg total) by mouth daily. Jarold Medici, MD  Active   Cholecalciferol  (VITAMIN D3) 2000 UNITS capsule [id number]  Take 2,000 Units by mouth daily.  [provider]  Active Self  glucose blood (CONTOUR TEST) test strip [id number]  Use once daily to check blood sugars. Jarold Medici, MD  Active   Lacosamide  150 MG TABS [id number]  TAKE 1 TABLET BY MOUTH TWICE DAILY Millikan, Megan, NP  Active   Magnesium  250 MG TABS [id number]  Take 250 mg by mouth 2 (two) times daily. [provider]  Active Self  metoprolol  tartrate (LOPRESSOR ) 100 MG tablet [id number]  TAKE 1 TABLET(100 MG) BY MOUTH TWICE  DAILY Jarold Medici, MD  Active   Prenatal Vit-Fe Fumarate-FA (PRENATAL MULTIVITAMIN/IRON PO) [id number]  Take by mouth. [provider]  Active   telmisartan  (MICARDIS ) 80 MG tablet [id number]  Take 1 tablet (80 mg total) by mouth daily. Jarold Medici, MD  Active             Recommendation:   Continue Current Plan of Care  Follow Up Plan:   Telephone follow-up in 1 month  Rolin Kerns, LCSW [facility]  [facility], [facility] Clinical Social Worker Direct Dial: [phone]  Fax: [phone] Website: [email or web address] 10:20  AM

## 2024-05-24 ENCOUNTER — Other Ambulatory Visit: Payer: Self-pay | Admitting: Licensed Clinical Social Worker

## 2024-05-24 NOTE — Patient Instructions (Signed)
 Visit Information  Thank you for taking time to visit with me today. Please don't hesitate to contact me if I can be of assistance to you before our next scheduled appointment.  Your next care management appointment is by telephone on 09/05 at 12:30 PM  Please call the care guide team at [phone] if you need to cancel, schedule, or reschedule an appointment.   Please call the Suicide and Crisis Lifeline: 988 go to [facility] Urgent [facility] [address], [place] [phone]) call 911 if you are experiencing a Mental Health or Behavioral Health Crisis or need someone to talk to.  Rolin Kerns, LCSW [place]  [facility], [facility] Clinical Social Worker Direct Dial: [phone]  Fax: [phone] Website: [email or web address] 1:09 PM

## 2024-05-24 NOTE — Patient Outreach (Signed)
 Complex Care Management   Visit Note  05/24/2024  Name:  Sara [name] MRN: [id number] DOB: 11-16-1955  Situation: Referral received for Complex Care Management related to Caregiver Stress I obtained verbal consent from Patient.  Visit completed with pt  on the phone  Background:   Past Medical History:  Diagnosis Date   Anxiety    Blood dyscrasia    Chronic kidney disease    Gout    Hyperlipemia    Hypertension    LVH (left ventricular hypertrophy)    12/21/09 Echo -mild asymmetric LVH,EF =>55%   Morbid obesity (HCC)    Myocardial infarction (HCC)    Nausea vomiting and diarrhea    OSA (obstructive sleep apnea)    PAD (peripheral artery disease) (HCC)    Pneumonia    Pre-diabetes    Renal artery stenosis (HCC) 12/13/2010   PTCA and Stent - right   Seizures (HCC)    Syncopal episodes    Thrombocytosis    Type 2 diabetes mellitus (HCC) 07/20/2013    Assessment: Patient Reported Symptoms:  Cognitive Cognitive Status: Alert and oriented to person, place, and time, Normal speech and language skills Cognitive/Intellectual Conditions Management [RPT]: None reported or documented in medical history or problem list   Health Maintenance Behaviors: Annual physical exam, Stress management  Neurological Neurological Review of Symptoms: Not assessed    HEENT HEENT Symptoms Reported: Not assessed      Cardiovascular Cardiovascular Symptoms Reported: Not assessed    Respiratory Respiratory Symptoms Reported: Not assesed    Endocrine Endocrine Symptoms Reported: Not assessed    Gastrointestinal Gastrointestinal Symptoms Reported: Not assessed      Genitourinary Genitourinary Symptoms Reported: Not assessed    Integumentary Integumentary Symptoms Reported: Not assessed    Musculoskeletal Musculoskelatal Symptoms Reviewed: Not assessed        Psychosocial Psychosocial Symptoms Reported: Other Other Psychosocial Conditions: Caregiver Stress and Grief Behavioral  Management Strategies: Coping strategies, Support system, Adequate rest, Community resources Major Change/Loss/Stressor/Fears (CP): Medical condition, self, Medical condition, family Techniques to [place] with Loss/Stress/Change: Diversional activities        05/02/2024    1:34 PM  Depression screen PHQ 2/9  Decreased Interest 0  Down, Depressed, Hopeless 0  PHQ - 2 Score 0    There were no vitals filed for this visit.  Medications Reviewed Today     Reviewed by Megham Dwyer D, LCSW (Social Worker) on 05/24/24 at 1302  Med List Status: <None>   Medication Order Taking? Sig Documenting Provider Last Dose Status Informant  amLODipine  (NORVASC ) 2.5 MG tablet [id number]  Take 1 tablet (2.5 mg total) by mouth daily. Jarold Medici, MD  Active   anagrelide  ([name]) 1 MG capsule [id number]  TAKE 1 CAPSULE(1 MG) BY MOUTH DAILY Boscia, Heather E, NP  Active   aspirin  EC 81 MG tablet [id number]  Take 81 mg by mouth daily. Swallow whole. [provider]  Active Self  atorvastatin  (LIPITOR) 40 MG tablet [id number]  Take 1 tablet (40 mg total) by mouth daily. Jarold Medici, MD  Active   Cholecalciferol  (VITAMIN D3) 2000 UNITS capsule [id number]  Take 2,000 Units by mouth daily.  [provider]  Active Self  glucose blood (CONTOUR TEST) test strip [id number]  Use once daily to check blood sugars. Jarold Medici, MD  Active   Lacosamide  150 MG TABS [id number]  TAKE 1 TABLET BY MOUTH TWICE DAILY Millikan, Megan, NP  Active   Magnesium  250 MG TABS [id number]  Take 250 mg  by mouth 2 (two) times daily. [provider]  Active Self  metoprolol  tartrate (LOPRESSOR ) 100 MG tablet [id number]  TAKE 1 TABLET(100 MG) BY MOUTH TWICE DAILY Jarold Medici, MD  Active   Prenatal Vit-Fe Fumarate-FA (PRENATAL MULTIVITAMIN/IRON PO) [id number]  Take by mouth. [provider]  Active   telmisartan  (MICARDIS ) 80 MG tablet [id number]  Take 1 tablet (80 mg total) by mouth daily. Jarold Medici, MD   Active             Recommendation:   Continue Current Plan of Care  Follow Up Plan:   Telephone follow-up in 1 month  Rolin Kerns, LCSW [facility]  [facility], [facility] Clinical Social Worker Direct Dial: [phone]  Fax: [phone] Website: [email or web address] 1:08 PM

## 2024-05-30 ENCOUNTER — Encounter: Payer: Self-pay | Admitting: Family Medicine

## 2024-05-30 ENCOUNTER — Telehealth: Payer: Self-pay | Admitting: Adult Health

## 2024-05-30 ENCOUNTER — Ambulatory Visit (INDEPENDENT_AMBULATORY_CARE_PROVIDER_SITE_OTHER): Admitting: Family Medicine

## 2024-05-30 VITALS — BP 120/80 | HR 76 | Temp 98.4°F | Ht 61.0 in | Wt 153.0 lb

## 2024-05-30 DIAGNOSIS — I129 Hypertensive chronic kidney disease with stage 1 through stage 4 chronic kidney disease, or unspecified chronic kidney disease: Secondary | ICD-10-CM

## 2024-05-30 DIAGNOSIS — N183 Chronic kidney disease, stage 3 unspecified: Secondary | ICD-10-CM

## 2024-05-30 DIAGNOSIS — R569 Unspecified convulsions: Secondary | ICD-10-CM

## 2024-05-30 DIAGNOSIS — F4329 Adjustment disorder with other symptoms: Secondary | ICD-10-CM

## 2024-05-30 DIAGNOSIS — E1122 Type 2 diabetes mellitus with diabetic chronic kidney disease: Secondary | ICD-10-CM

## 2024-05-30 NOTE — Telephone Encounter (Signed)
 I called pt back and relayed that  Duwaine, NP  was  sent message, but it could be Monday until she see's it.  She verbalized understanding.

## 2024-05-30 NOTE — Telephone Encounter (Signed)
 I called pt and she is in car with her husband, she had what she thinks was a seizure last night around midnight -0100.  Husband heard her making funny noise , arms were flailing. Pt has been compliant with taking her medication vimpat  150mg  po bid.  She has been stressed and not sleeping well due to son's illness.  No illness.  She has lost weight, not eating like she should.  She did go to internal  med today. I told her to try to decrease stressors,try to  sleep well.  She has not been drinking to doing recreational drugs.  I told will send message to MM and let her know and then call her back.

## 2024-05-30 NOTE — Progress Notes (Signed)
 I,[name], CMA,acting as a Neurosurgeon for [employer], NP.,have documented all relevant documentation on the behalf of Bruna Creighton, NP,as directed by  Bruna Creighton, NP while in the presence of Bruna Creighton, NP.  Subjective:  Patient ID: Sara [name] , female    DOB: [birth year] , 68 y.o.   MRN: [id number]  Chief Complaint  Patient presents with   Seizures    Patient presents today for a possible seizure. She reports she thinks she may have had one due to her taking her bp med & her not eating right due to her having a seizure.      History of Present Illness    Patient is a 67 year old female old who presents today because she may have had a seizure last night. She said the seizure was witnessed by her husband, patient has a history of seizures and she is on Lacosamide  150 mg twice daily, she is followed by [facility]..  Patient is advised to follow up with Neurology for evaluation and medication management. She voiced understanding and states she has been complaint in taking her meds.  She has hypertension with chronic kidney disease stage 3, she is on an ARB for high blood pressure, Micardis  80 mg every day. Patient states she has been under a lot of stress taking take of her son with mental illness who recently had a crisis     Past Medical History:  Diagnosis Date   Anxiety    Blood dyscrasia    Chronic kidney disease    Gout    Hyperlipemia    Hypertension    LVH (left ventricular hypertrophy)    12/21/09 Echo -mild asymmetric LVH,EF =>55%   Morbid obesity (HCC)    Myocardial infarction (HCC)    Nausea vomiting and diarrhea    OSA (obstructive sleep apnea)    PAD (peripheral artery disease) (HCC)    Pneumonia    Pre-diabetes    Renal artery stenosis (HCC) 12/13/2010   PTCA and Stent - right   Seizures (HCC)    Syncopal episodes    Thrombocytosis    Type 2 diabetes mellitus (HCC) 07/20/2013     Family History  Problem Relation Age of Onset    Heart failure Mother    Diabetes Father    Diabetes Sister    Hypertension Sister    Hypertension Sister    Hypertension Sister    Breast cancer Maternal Aunt        2 maternal aunts had breast cancer.   Breast cancer Maternal Grandmother    Sleep apnea Neg Hx      Current Outpatient Medications:    amLODipine  (NORVASC ) 2.5 MG tablet, Take 1 tablet (2.5 mg total) by mouth daily., Disp: 90 tablet, Rfl: 2   anagrelide  ([name]) 1 MG capsule, TAKE 1 CAPSULE(1 MG) BY MOUTH DAILY, Disp: 30 capsule, Rfl: 5   aspirin  EC 81 MG tablet, Take 81 mg by mouth daily. Swallow whole., Disp: , Rfl:    Cholecalciferol  (VITAMIN D3) 2000 UNITS capsule, Take 2,000 Units by mouth daily. , Disp: , Rfl:    glucose blood (CONTOUR TEST) test strip, Use once daily to check blood sugars., Disp: 100 each, Rfl: 1   Lacosamide  150 MG TABS, TAKE 1 TABLET BY MOUTH TWICE DAILY, Disp: 60 tablet, Rfl: 5   Magnesium  250 MG TABS, Take 250 mg by mouth 2 (two) times daily., Disp: , Rfl:    metoprolol  tartrate (LOPRESSOR ) 100 MG tablet,  TAKE 1 TABLET(100 MG) BY MOUTH TWICE DAILY, Disp: 180 tablet, Rfl: 3   Prenatal Vit-Fe Fumarate-FA (PRENATAL MULTIVITAMIN/IRON PO), Take by mouth., Disp: , Rfl:    telmisartan  (MICARDIS ) 80 MG tablet, Take 1 tablet (80 mg total) by mouth daily., Disp: 90 tablet, Rfl: 2   atorvastatin  (LIPITOR) 40 MG tablet, TAKE 1 TABLET(40 MG) BY MOUTH DAILY, Disp: 90 tablet, Rfl: 3   Allergies  Allergen Reactions   Allegra Allergy [Fexofenadine Hcl] Other (See Comments)    Generally not feeling well.    Sulfa Antibiotics Itching     Review of Systems  Constitutional: Negative.   HENT: Negative.    Respiratory: Negative.    Cardiovascular: Negative.   Musculoskeletal: Negative.   Neurological:  Positive for seizures.  Psychiatric/Behavioral:  The patient is nervous/anxious.      Today's Vitals   05/30/24 1150  BP: 120/80  Pulse: 76  Temp: 98.4 F (36.9 C)  TempSrc: Oral  Weight: 153 lb  (69.4 kg)  Height: 5' 1 (1.549 m)  PainSc: 0-No pain   Body mass index is 28.91 kg/m.  Wt Readings from Last 3 Encounters:  06/11/24 155 lb (70.3 kg)  05/30/24 153 lb (69.4 kg)  05/02/24 160 lb 9.6 oz (72.8 kg)    The ASCVD Risk score (Arnett DK, et al., 2019) failed to calculate for the following reasons:   Risk score cannot be calculated because patient has a medical history suggesting prior/existing ASCVD  Objective:  Physical Exam HENT:     Head: Normocephalic.  Cardiovascular:     Rate and Rhythm: Normal rate and regular rhythm.  Pulmonary:     Effort: Pulmonary effort is normal.     Breath sounds: Normal breath sounds.  Skin:    General: Skin is warm and dry.  Neurological:     General: No focal deficit present.     Mental Status: She is alert and oriented to person, place, and time.  Psychiatric:        Mood and Affect: Mood normal.         Assessment And Plan:  Seizures (HCC) Assessment & Plan: On Lacosamide  150 mg twice daily   Stress and adjustment reaction Assessment & Plan: Related to care for a sick family member   Benign hypertension with CKD (chronic kidney disease) stage III (HCC) Assessment & Plan: Chronic, stable. Encouraged to keep BP well controlled and avoid use of NSAIDs      Return if symptoms worsen or fail to improve, for Keep your next appt.  Patient was given opportunity to ask questions. Patient verbalized understanding of the plan and was able to repeat key elements of the plan. All questions were answered to their satisfaction.    I, Bruna Creighton, NP, have reviewed all documentation for this visit. The documentation on 06/12/2024 for the exam, diagnosis, procedures, and orders are all accurate and complete.   IF YOU HAVE BEEN REFERRED TO A SPECIALIST, IT MAY TAKE 1-2 WEEKS TO SCHEDULE/PROCESS THE REFERRAL. IF YOU HAVE NOT HEARD FROM US /SPECIALIST IN TWO WEEKS, PLEASE GIVE US  A CALL AT [phone].

## 2024-05-30 NOTE — Telephone Encounter (Signed)
 Pt is asking for a call to discuss that her husband informed her that last night she went into a deep sleep making a concerning noise (like when she has had previous seizure episodes) spouse told pt that she went out for a while, didn't wake up right away.  Pt admits that she has not been eating right , she does not recall this episode, she would like to discuss with RN, please call.

## 2024-05-31 NOTE — Telephone Encounter (Signed)
 I called the patient. She states that husband noticed that she was moving about in her sleep. She states he said she was punching in the air. Not convulsing. Did not bite her tongue or gum. No loss of bowels or bladder. Advised that she could have been dreaming? Acting out a dream? Not sure if this was a seizure event? She said that is possible. Reports increased stress, not eating well. I advised that we could increase vimpat  to 200 mg BID if she would like. She deferred for now. States that she will let me know if that happens again. She is going to work on eating regular meals and reducing her stress.

## 2024-06-03 DIAGNOSIS — M85851 Other specified disorders of bone density and structure, right thigh: Secondary | ICD-10-CM | POA: Diagnosis not present

## 2024-06-03 DIAGNOSIS — M85852 Other specified disorders of bone density and structure, left thigh: Secondary | ICD-10-CM | POA: Diagnosis not present

## 2024-06-03 DIAGNOSIS — Z1231 Encounter for screening mammogram for malignant neoplasm of breast: Secondary | ICD-10-CM | POA: Diagnosis not present

## 2024-06-03 LAB — HM MAMMOGRAPHY

## 2024-06-03 LAB — HM DEXA SCAN

## 2024-06-05 ENCOUNTER — Ambulatory Visit: Payer: Self-pay | Admitting: Internal Medicine

## 2024-06-08 ENCOUNTER — Other Ambulatory Visit: Payer: Self-pay | Admitting: Internal Medicine

## 2024-06-08 DIAGNOSIS — I130 Hypertensive heart and chronic kidney disease with heart failure and stage 1 through stage 4 chronic kidney disease, or unspecified chronic kidney disease: Secondary | ICD-10-CM

## 2024-06-11 ENCOUNTER — Other Ambulatory Visit: Payer: Self-pay

## 2024-06-11 VITALS — Wt 155.0 lb

## 2024-06-11 DIAGNOSIS — E11649 Type 2 diabetes mellitus with hypoglycemia without coma: Secondary | ICD-10-CM

## 2024-06-11 DIAGNOSIS — R569 Unspecified convulsions: Secondary | ICD-10-CM

## 2024-06-11 DIAGNOSIS — I10 Essential (primary) hypertension: Secondary | ICD-10-CM

## 2024-06-11 NOTE — Patient Instructions (Addendum)
 Visit Information  Thank you for taking time to visit with me today. Please don't hesitate to contact me if I can be of assistance to you before our next scheduled appointment.  Your next care management appointment is by telephone on Wednesday, September 17 at 12:15 AM  Please call the care guide team at [phone] if you need to cancel, schedule, or reschedule an appointment.   Please call [phone]-TALK (toll free, 24 hour hotline) if you are experiencing a Mental Health or Behavioral Health Crisis or need someone to talk to.  Clayborne Ly RN BSN CCM [place]  [facility], [facility] Nurse Care Coordinator  Direct Dial: [phone] Website: [email or web address]

## 2024-06-11 NOTE — Patient Outreach (Signed)
 Complex Care Management   Visit Note  06/11/2024  Name:  Sara [name] MRN: [id number] DOB: August 19, 1956  Situation: Referral received for Complex Care Management related to Hypertensive heart and renal disease with renal failure, stage 1 through stage 4 or unspecified chronic kidney disease, with heart failure. I obtained verbal consent from Patient.  Visit completed with Patient on the phone.  Background:   Past Medical History:  Diagnosis Date   Anxiety    Blood dyscrasia    Chronic kidney disease    Gout    Hyperlipemia    Hypertension    LVH (left ventricular hypertrophy)    12/21/09 Echo -mild asymmetric LVH,EF =>55%   Morbid obesity (HCC)    Myocardial infarction (HCC)    Nausea vomiting and diarrhea    OSA (obstructive sleep apnea)    PAD (peripheral artery disease) (HCC)    Pneumonia    Pre-diabetes    Renal artery stenosis (HCC) 12/13/2010   PTCA and Stent - right   Seizures (HCC)    Syncopal episodes    Thrombocytosis    Type 2 diabetes mellitus (HCC) 07/20/2013    Assessment: Patient Reported Symptoms:  Cognitive Cognitive Status: Alert and oriented to person, place, and time, Normal speech and language skills Cognitive/Intellectual Conditions Management [RPT]: None reported or documented in medical history or problem list   Health Maintenance Behaviors: Annual physical exam, Stress management, Healthy diet Health Facilitated by: Healthy diet, Stress management, Rest  Neurological Neurological Review of Symptoms: No symptoms reported    HEENT HEENT Symptoms Reported: No symptoms reported      Cardiovascular Cardiovascular Symptoms Reported: No symptoms reported Does patient have uncontrolled Hypertension?: No Cardiovascular Management Strategies: Adequate rest, Routine screening, Medication therapy Weight: 155 lb (70.3 kg) Cardiovascular Self-Management Outcome: 4 (good)  Respiratory Respiratory Symptoms Reported: No symptoms reported    Endocrine  Endocrine Symptoms Reported: Hypoglycemia, Other Other symptoms related to hypoglycemia or hyperglycemia: potential Seizure activity related to hypoglycemia Is patient diabetic?: Yes Is patient checking blood sugars at home?: No Endocrine Self-Management Outcome: 3 (uncertain)  Gastrointestinal Gastrointestinal Symptoms Reported: No symptoms reported      Genitourinary Genitourinary Symptoms Reported: No symptoms reported    Integumentary Integumentary Symptoms Reported: No symptoms reported    Musculoskeletal Musculoskelatal Symptoms Reviewed: No symptoms reported        Psychosocial Psychosocial Symptoms Reported: Other Other Psychosocial Conditions: caregiver stress Behavioral Management Strategies: Coping strategies, Support system, Adequate rest Behavioral Health Self-Management Outcome: 4 (good) Major Change/Loss/Stressor/Fears (CP): Medical condition, self, Medical condition, family Techniques to [place] with Loss/Stress/Change: Diversional activities Quality of Family Relationships: involved, helpful, supportive Do you feel physically threatened by others?: No    06/11/2024    PHQ2-9 Depression Screening   Sara [name] interest or pleasure in doing things Not at all  Feeling down, depressed, or hopeless Not at all  PHQ-2 - Total Score 0  Trouble falling or staying asleep, or sleeping too much    Feeling tired or having Sara [name] energy    Poor appetite or overeating     Feeling bad about yourself - or that you are a failure or have let yourself or your family down    Trouble concentrating on things, such as reading the newspaper or watching television    Moving or speaking so slowly that other people could have noticed.  Or the opposite - being so fidgety or restless that you have been moving around a lot more than usual    Thoughts that  you would be better off dead, or hurting yourself in some way    PHQ2-9 Total Score    If you checked off any problems, how difficult have these  problems made it for you to do your work, take care of things at home, or get along with other people    Depression Interventions/Treatment      There were no vitals filed for this visit.  Medications Reviewed Today     Reviewed by Morgan Clayborne CROME, RN (Registered Nurse) on 06/11/24 at 1312  Med List Status: <None>   Medication Order Taking? Sig Documenting Provider Last Dose Status Informant  amLODipine  (NORVASC ) 2.5 MG tablet [id number]  Take 1 tablet (2.5 mg total) by mouth daily. Jarold Medici, MD  Active   anagrelide  ([name]) 1 MG capsule [id number]  TAKE 1 CAPSULE(1 MG) BY MOUTH DAILY Boscia, Heather E, NP  Active   aspirin  EC 81 MG tablet [id number]  Take 81 mg by mouth daily. Swallow whole. [provider]  Active Self  atorvastatin  (LIPITOR) 40 MG tablet [id number]  TAKE 1 TABLET(40 MG) BY MOUTH DAILY Jarold Medici, MD  Active   Cholecalciferol  (VITAMIN D3) 2000 UNITS capsule [id number]  Take 2,000 Units by mouth daily.  [provider]  Active Self  glucose blood (CONTOUR TEST) test strip [id number]  Use once daily to check blood sugars. Jarold Medici, MD  Active   Lacosamide  150 MG TABS [id number]  TAKE 1 TABLET BY MOUTH TWICE DAILY Millikan, Megan, NP  Active   Magnesium  250 MG TABS [id number]  Take 250 mg by mouth 2 (two) times daily. [provider]  Active Self  metoprolol  tartrate (LOPRESSOR ) 100 MG tablet [id number]  TAKE 1 TABLET(100 MG) BY MOUTH TWICE DAILY Jarold Medici, MD  Active   Prenatal Vit-Fe Fumarate-FA (PRENATAL MULTIVITAMIN/IRON PO) [id number]  Take by mouth. [provider]  Active   telmisartan  (MICARDIS ) 80 MG tablet [id number]  Take 1 tablet (80 mg total) by mouth daily. Jarold Medici, MD  Active             Recommendation:   PCP Follow-up with Dr. Jarold on 06/17/24 at 8:40 AM  Follow Up Plan:   Telephone follow up appointment date/time:  Wednesday, September 17 at 12:15 PM Referral to Pharmacist  Clayborne Morgan RN BSN  CCM [facility]  [facility], [facility] Nurse Care Coordinator  Direct Dial: [phone] Website: [email or web address]

## 2024-06-12 DIAGNOSIS — I129 Hypertensive chronic kidney disease with stage 1 through stage 4 chronic kidney disease, or unspecified chronic kidney disease: Secondary | ICD-10-CM | POA: Insufficient documentation

## 2024-06-12 DIAGNOSIS — F4329 Adjustment disorder with other symptoms: Secondary | ICD-10-CM | POA: Insufficient documentation

## 2024-06-12 NOTE — Assessment & Plan Note (Signed)
 Chronic, stable. Encouraged to keep BP well controlled and avoid use of NSAIDs

## 2024-06-12 NOTE — Assessment & Plan Note (Signed)
 Related to care for a sick family member

## 2024-06-12 NOTE — Assessment & Plan Note (Signed)
 On Lacosamide  150 mg twice daily

## 2024-06-14 ENCOUNTER — Telehealth: Payer: Self-pay | Admitting: Pharmacist

## 2024-06-14 DIAGNOSIS — E1165 Type 2 diabetes mellitus with hyperglycemia: Secondary | ICD-10-CM

## 2024-06-14 NOTE — Progress Notes (Signed)
   06/14/2024  Patient ID: Sara [name], female   DOB: February 16, 1956, 68 y.o.   MRN: [id number]  Patient was called to help assist her with getting a continuous glucose monitor sensors. Unfortunately, she did not answer the phone. HIPAA compliant message was left with a female. She was also called on her cell phone but it just rang and a voice mail did not pick up.    Patient has [employer]. Their preferred continuous glucose monitor is [employer].   A1c- 6 04/25 On statin therapy- Atorvastatin  40 mg filled 06/11/2024 #90--SUPD gap closed. On ACE/ARB therapy-Telmisartan  80 mg filled 05/20/24 #90 On Amlodipine  2.5 mg last filled 06/08/24 #90    Plan: Call Patient back in 1 week.   Cassius DOROTHA Brought, PharmD, BCACP Clinical Pharmacist [phone]

## 2024-06-17 ENCOUNTER — Encounter: Payer: Self-pay | Admitting: Internal Medicine

## 2024-06-17 ENCOUNTER — Ambulatory Visit (INDEPENDENT_AMBULATORY_CARE_PROVIDER_SITE_OTHER): Payer: Medicare PPO | Admitting: Internal Medicine

## 2024-06-17 ENCOUNTER — Telehealth: Payer: Self-pay | Admitting: Pharmacist

## 2024-06-17 ENCOUNTER — Ambulatory Visit: Payer: Self-pay | Admitting: Internal Medicine

## 2024-06-17 VITALS — BP 110/60 | HR 79 | Temp 98.7°F | Ht 61.0 in | Wt 157.0 lb

## 2024-06-17 DIAGNOSIS — I130 Hypertensive heart and chronic kidney disease with heart failure and stage 1 through stage 4 chronic kidney disease, or unspecified chronic kidney disease: Secondary | ICD-10-CM

## 2024-06-17 DIAGNOSIS — M8589 Other specified disorders of bone density and structure, multiple sites: Secondary | ICD-10-CM | POA: Insufficient documentation

## 2024-06-17 DIAGNOSIS — E1122 Type 2 diabetes mellitus with diabetic chronic kidney disease: Secondary | ICD-10-CM | POA: Diagnosis not present

## 2024-06-17 DIAGNOSIS — R569 Unspecified convulsions: Secondary | ICD-10-CM | POA: Diagnosis not present

## 2024-06-17 DIAGNOSIS — E1165 Type 2 diabetes mellitus with hyperglycemia: Secondary | ICD-10-CM

## 2024-06-17 DIAGNOSIS — I5032 Chronic diastolic (congestive) heart failure: Secondary | ICD-10-CM

## 2024-06-17 DIAGNOSIS — M79641 Pain in right hand: Secondary | ICD-10-CM | POA: Insufficient documentation

## 2024-06-17 DIAGNOSIS — N1831 Chronic kidney disease, stage 3a: Secondary | ICD-10-CM

## 2024-06-17 DIAGNOSIS — Z0000 Encounter for general adult medical examination without abnormal findings: Secondary | ICD-10-CM

## 2024-06-17 LAB — POCT URINALYSIS DIP (CLINITEK)
Bilirubin, UA: NEGATIVE
Blood, UA: NEGATIVE
Glucose, UA: NEGATIVE mg/dL
Ketones, POC UA: NEGATIVE mg/dL
Leukocytes, UA: NEGATIVE
Nitrite, UA: NEGATIVE
POC PROTEIN,UA: NEGATIVE
Spec Grav, UA: 1.01 (ref 1.010–1.025)
Urobilinogen, UA: 0.2 U/dL
pH, UA: 6.5 (ref 5.0–8.0)

## 2024-06-17 NOTE — Assessment & Plan Note (Addendum)
 Chronic, diabetic foot exam was performed.  Type 2 diabetes mellitus with nutritional deficiency due to reduced oral intake Discussed meal reminders and glucose monitoring to prevent hypoglycemia. Emphasized bedtime snack for stable blood sugar. - Educate on setting alarms to remind to eat. - Use glucose sensor to monitor blood sugar levels. - Encourage eating a small snack with healthy fats before bed. - She was also given Libre 3 sensors and advised on how to apply and follow her BS readings on [employer].

## 2024-06-17 NOTE — Assessment & Plan Note (Addendum)
 Managed with lacosamide . Discussed regular meals to prevent hypoglycemia and seizure risk. - Continue lacosamide  twice daily. - Encourage regular meals to prevent low blood sugar. - Med management as per Neuro

## 2024-06-17 NOTE — Assessment & Plan Note (Addendum)
 Most recent dexa from August 2025 reviewed in detail. She is encouraged to engage in weight-bearing exercises at least three days per week. Should take vitamin d3 and calcium  daily to help improve bone density.

## 2024-06-17 NOTE — Progress Notes (Signed)
 I,[name], CMA,acting as a Neurosurgeon for Sara LOISE Slocumb, MD.,have documented all relevant documentation on the behalf of Sara LOISE Slocumb, MD,as directed by  Sara LOISE Slocumb, MD while in the presence of Sara LOISE Slocumb, MD.  Subjective:    Patient ID: Sara [name] , female    DOB: [birth year] , 68 y.o.   MRN: [id number]  Chief Complaint  Patient presents with   Annual Exam    Patient presents today for annual exam. She is no longer followed by Gynecology. She is s/p hysterectomy.  She reports compliance with medications. Denies headache, chest pain, and SOB.    HPI Discussed the use of AI scribe software for clinical note transcription with the patient, who gave verbal consent to proceed.  History of Present Illness Sara [name] is a 68 year old female with seizure disorder, diabetes, and hypertension who presents for a physical exam, diabetes, and blood pressure check.  She experiences seizures and reports inadequate eating habits, sometimes forgetting to eat or not feeling hungry. She often forgets to eat or does not feel hungry, typically having breakfast around 8:00 AM, sometimes again at 11:00 AM, and then not eating until 5:00 PM, with her last meal often being at 9:00 PM. It has been a while since her last seizure, which her husband attributes to a stressful period involving her son, who has schizophrenia.  She has a history of seizure disorder and is currently taking lacosamide  twice a day, one pill in the morning and one at night. She is not currently on any diabetes medication. She has a history of not using her glucose meter regularly.  She engages in daily physical activity, including walking for about thirty minutes and using stairs in her house. Her breakfast typically includes yogurt, oatmeal, or boiled eggs, and she occasionally eats eggs and bacon. She is cautious about her calorie intake to prevent weight gain.  She experiences stiffness in her right hand,  particularly in the center of her fingers, which she attributes to arthritis. She has a family history of arthritis, including rheumatoid arthritis, on her mother's side. She manages the stiffness by working her hand every morning.  Her current medications include amlodipine  2.5 mg, metoprolol  100 mg twice daily, telmisartan  80 mg in the morning, and aspirin .       Diabetes She presents for her follow-up diabetic visit. She has type 2 diabetes mellitus. Her disease course has been stable. There are no hypoglycemic associated symptoms. There are no diabetic associated symptoms. Pertinent negatives for diabetes include no blurred vision. There are no hypoglycemic complications. Diabetic complications include heart disease and nephropathy. Risk factors for coronary artery disease include diabetes mellitus, dyslipidemia, hypertension, obesity, post-menopausal and sedentary lifestyle. She is compliant with treatment most of the time. She is following a diabetic diet. She participates in exercise three times a week. Her breakfast blood glucose is taken between 7-8 am. Her breakfast blood glucose range is generally 90-110 mg/dl. An ACE inhibitor/angiotensin II receptor blocker is being taken. Eye exam is current.  Hypertension This is a chronic problem. The current episode started more than 1 year ago. The problem has been gradually improving since onset. The problem is uncontrolled. Pertinent negatives include no blurred vision. The current treatment provides moderate improvement. Compliance problems include exercise.  Hypertensive end-organ damage includes CAD/MI.     Past Medical History:  Diagnosis Date   Anxiety    Blood dyscrasia    Chronic kidney disease  Gout    Hyperlipemia    Hypertension    LVH (left ventricular hypertrophy)    12/21/09 Echo -mild asymmetric LVH,EF =>55%   Morbid obesity (HCC)    Myocardial infarction (HCC)    Nausea vomiting and diarrhea    OSA (obstructive sleep  apnea)    PAD (peripheral artery disease) (HCC)    Pneumonia    Pre-diabetes    Renal artery stenosis (HCC) 12/13/2010   PTCA and Stent - right   Seizures (HCC)    Syncopal episodes    Thrombocytosis    Type 2 diabetes mellitus (HCC) 07/20/2013     Family History  Problem Relation Age of Onset   Heart failure Mother    Diabetes Father    Diabetes Sister    Hypertension Sister    Hypertension Sister    Hypertension Sister    Breast cancer Maternal Aunt        2 maternal aunts had breast cancer.   Breast cancer Maternal Grandmother    Sleep apnea Neg Hx      Current Outpatient Medications:    amLODipine  (NORVASC ) 2.5 MG tablet, Take 1 tablet (2.5 mg total) by mouth daily., Disp: 90 tablet, Rfl: 2   anagrelide  ([name]) 1 MG capsule, TAKE 1 CAPSULE(1 MG) BY MOUTH DAILY, Disp: 30 capsule, Rfl: 5   aspirin  EC 81 MG tablet, Take 81 mg by mouth daily. Swallow whole., Disp: , Rfl:    atorvastatin  (LIPITOR) 40 MG tablet, TAKE 1 TABLET(40 MG) BY MOUTH DAILY, Disp: 90 tablet, Rfl: 3   Cholecalciferol  (VITAMIN D3) 2000 UNITS capsule, Take 2,000 Units by mouth daily. , Disp: , Rfl:    glucose blood (CONTOUR TEST) test strip, Use once daily to check blood sugars., Disp: 100 each, Rfl: 1   Lacosamide  150 MG TABS, TAKE 1 TABLET BY MOUTH TWICE DAILY, Disp: 60 tablet, Rfl: 5   Magnesium  250 MG TABS, Take 250 mg by mouth 2 (two) times daily., Disp: , Rfl:    metoprolol  tartrate (LOPRESSOR ) 100 MG tablet, TAKE 1 TABLET(100 MG) BY MOUTH TWICE DAILY, Disp: 180 tablet, Rfl: 3   Prenatal Vit-Fe Fumarate-FA (PRENATAL MULTIVITAMIN/IRON PO), Take by mouth., Disp: , Rfl:    telmisartan  (MICARDIS ) 80 MG tablet, Take 1 tablet (80 mg total) by mouth daily., Disp: 90 tablet, Rfl: 2   Allergies  Allergen Reactions   Allegra Allergy [Fexofenadine Hcl] Other (See Comments)    Generally not feeling well.    Sulfa Antibiotics Itching      The patient states she uses post menopausal status for birth  control. No LMP recorded. Patient has had a hysterectomy.. Negative for Dysmenorrhea. Negative for: breast discharge, breast lump(s), breast pain and breast self exam. Associated symptoms include abnormal vaginal bleeding. Pertinent negatives include abnormal bleeding (hematology), anxiety, decreased libido, depression, difficulty falling sleep, dyspareunia, history of infertility, nocturia, sexual dysfunction, sleep disturbances, urinary incontinence, urinary urgency, vaginal discharge and vaginal itching. Diet regular.The patient states her exercise level is  intermittent.  . The patient's tobacco use is:  Social History   Tobacco Use  Smoking Status Never  Smokeless Tobacco Never  . She has been exposed to passive smoke. The patient's alcohol  use is:  Social History   Substance and Sexual Activity  Alcohol  Use No   Alcohol /week: 0.0 standard drinks of alcohol     Review of Systems  Constitutional: Negative.   HENT: Negative.    Eyes: Negative.  Negative for blurred vision.  Respiratory: Negative.  Cardiovascular: Negative.   Gastrointestinal: Negative.   Endocrine: Negative.   Genitourinary: Negative.   Musculoskeletal: Negative.   Skin: Negative.   Hematological: Negative.   Psychiatric/Behavioral: Negative.       Today's Vitals   06/17/24 0839 06/17/24 0913  BP: (!) 140/70 110/60  Pulse: 79   Temp: 98.7 F (37.1 C)   TempSrc: Oral   Weight: 157 lb (71.2 kg)   Height: 5' 1 (1.549 m)   PainSc: 1    PainLoc: Finger    Body mass index is 29.66 kg/m.  Wt Readings from Last 3 Encounters:  06/17/24 157 lb (71.2 kg)  06/11/24 155 lb (70.3 kg)  05/30/24 153 lb (69.4 kg)     Objective:  Physical Exam Constitutional:      General: She is not in acute distress.    Appearance: Normal appearance. She is well-developed.  HENT:     Head: Normocephalic and atraumatic.     Right Ear: Hearing, tympanic membrane, ear canal and external ear normal. There is no impacted  cerumen.     Left Ear: Hearing, tympanic membrane, ear canal and external ear normal. There is no impacted cerumen.     Nose:     Comments: Deferred - masked    Mouth/Throat:     Pharynx: Oropharynx is clear. No oropharyngeal exudate or posterior oropharyngeal erythema.  Eyes:     General: Lids are normal.     Extraocular Movements: Extraocular movements intact.     Conjunctiva/sclera: Conjunctivae normal.     Pupils: Pupils are equal, round, and reactive to light.     Funduscopic exam:    Right eye: No papilledema.        Left eye: No papilledema.  Neck:     Thyroid : No thyroid  mass.     Vascular: No carotid bruit.  Cardiovascular:     Rate and Rhythm: Normal rate and regular rhythm.     Pulses: Normal pulses.          Dorsalis pedis pulses are 2+ on the right side and 2+ on the left side.     Heart sounds: Normal heart sounds. No murmur heard. Pulmonary:     Effort: Pulmonary effort is normal.     Breath sounds: Normal breath sounds.  Chest:     Chest wall: No mass.  Breasts:    Tanner Score is 5.     Right: Normal. No mass or tenderness.     Left: Normal. No mass or tenderness.     Comments: Healed sternal scar Healed surgical scars below both breasts Abdominal:     General: Abdomen is flat. Bowel sounds are normal. There is no distension.     Palpations: Abdomen is soft.     Tenderness: There is no abdominal tenderness.  Genitourinary:    Rectum: Guaiac result negative.  Musculoskeletal:        General: No swelling. Normal range of motion.     Cervical back: Full passive range of motion without pain, normal range of motion and neck supple.     Right lower leg: No edema.     Left lower leg: No edema.  Feet:     Right foot:     Protective Sensation: 5 sites tested.  5 sites sensed.     Skin integrity: Callus and dry skin present.     Toenail Condition: Right toenails are normal.     Left foot:     Protective Sensation: 5 sites tested.  5  sites sensed.     Skin  integrity: Dry skin present.     Toenail Condition: Left toenails are long.  Lymphadenopathy:     Upper Body:     Right upper body: No supraclavicular, axillary or pectoral adenopathy.     Left upper body: No supraclavicular, axillary or pectoral adenopathy.  Skin:    General: Skin is warm and dry.     Capillary Refill: Capillary refill takes less than 2 seconds.  Neurological:     General: No focal deficit present.     Mental Status: She is alert and oriented to person, place, and time.     Cranial Nerves: No cranial nerve deficit.     Sensory: No sensory deficit.  Psychiatric:        Mood and Affect: Mood normal.        Behavior: Behavior normal.        Thought Content: Thought content normal.        Judgment: Judgment normal.     Assessment And Plan:     Routine general medical examination at health care facility Assessment & Plan: A full exam was performed.  Importance of monthly self breast exams was discussed with the patient.  She is advised to get 30-45 minutes of regular exercise, no less than four to five days per week. Both weight-bearing and aerobic exercises are recommended.  She is advised to follow a healthy diet with at least six fruits/veggies per day, decrease intake of red meat and other saturated fats and to increase fish intake to twice weekly.  Meats/fish should not be fried -- baked, boiled or broiled is preferable. It is also important to cut back on your sugar intake.  Be sure to read labels - try to avoid anything with added sugar, high fructose corn syrup or other sweeteners.  If you must use a sweetener, you can try stevia or monkfruit.  It is also important to avoid artificially sweetened foods/beverages and diet drinks. Lastly, wear SPF 50 sunscreen on exposed skin and when in direct sunlight for an extended period of time.  Be sure to avoid fast food restaurants and aim for at least 60 ounces of water daily.       Hypertensive heart and renal disease with  renal failure, stage 1 through stage 4 or unspecified chronic kidney disease, with heart failure (HCC) Assessment & Plan: Chronic, initially uncontrolled. Repeat reading improved.  Goal BP<120/80.  She will continue with metoprolol  tartrate 100mg  twice daily, amlodipine  2.5mg  and telmisartan  80mg  daily. She is encouraged to follow low sodium diet. - Encourage home blood pressure monitoring. - I plan to increase dose of amlodipine  IF her blood pressure remains high.   Orders: -     CMP14+EGFR  Chronic diastolic CHF (congestive heart failure) (HCC) Assessment & Plan: Chronic, sx are stable. July 2024 echo results reviewed in detail. She is encouraged to follow a low sodium diet. Most recent Cardiology notes reviewed.   Type 2 diabetes mellitus with stage 3a chronic kidney disease, without long-term current use of insulin  [facility]) Assessment & Plan: Chronic, diabetic foot exam was performed.  Type 2 diabetes mellitus with nutritional deficiency due to reduced oral intake Discussed meal reminders and glucose monitoring to prevent hypoglycemia. Emphasized bedtime snack for stable blood sugar. - Educate on setting alarms to remind to eat. - Use glucose sensor to monitor blood sugar levels. - Encourage eating a small snack with healthy fats before bed. - She was also given Herlene  3 sensors and advised on how to apply and follow her BS readings on [place] app.   Orders: -     POCT URINALYSIS DIP (CLINITEK) -     Microalbumin / creatinine urine ratio -     CMP14+EGFR -     Hemoglobin A1c -     Lipid panel  Seizures (HCC) Assessment & Plan: Managed with lacosamide . Discussed regular meals to prevent hypoglycemia and seizure risk. - Continue lacosamide  twice daily. - Encourage regular meals to prevent low blood sugar. - Med management as per Neuro   Osteopenia of multiple sites Assessment & Plan: Most recent dexa from August 2025 reviewed in detail. She is encouraged to engage in  weight-bearing exercises at least three days per week. Should take vitamin d3 and calcium  daily to help improve bone density.    Right hand pain Assessment & Plan: Positive squeeze test.  Symptoms suggest osteoarthritis. Family history noted. Differential includes rheumatoid arthritis. - Order arthritis panel to rule out rheumatoid arthritis. - Recommend taking Tylenol  for pain management.  Orders: -     ANA, IFA (with reflex) -     CYCLIC CITRUL PEPTIDE ANTIBODY, IGG/IGA -     Rheumatoid factor -     Sedimentation rate -     Uric acid   Return for 1 year physical, 4 month DM. Patient was given opportunity to ask questions. Patient verbalized understanding of the plan and was able to repeat key elements of the plan. All questions were answered to their satisfaction.   I, Sara LOISE Slocumb, MD, have reviewed all documentation for this visit. The documentation on 06/17/24 for the exam, diagnosis, procedures, and orders are all accurate and complete.

## 2024-06-17 NOTE — Patient Instructions (Signed)

## 2024-06-17 NOTE — Assessment & Plan Note (Signed)
 Positive squeeze test.  Symptoms suggest osteoarthritis. Family history noted. Differential includes rheumatoid arthritis. - Order arthritis panel to rule out rheumatoid arthritis. - Recommend taking Tylenol  for pain management.

## 2024-06-17 NOTE — Assessment & Plan Note (Signed)
 Chronic, initially uncontrolled. Repeat reading improved.  Goal BP<120/80.  She will continue with metoprolol  tartrate 100mg  twice daily, amlodipine  2.5mg  and telmisartan  80mg  daily. She is encouraged to follow low sodium diet. - Encourage home blood pressure monitoring. - I plan to increase dose of amlodipine  IF her blood pressure remains high.

## 2024-06-17 NOTE — Assessment & Plan Note (Signed)
 Chronic, sx are stable. July 2024 echo results reviewed in detail. She is encouraged to follow a low sodium diet. Most recent Cardiology notes reviewed.

## 2024-06-17 NOTE — Progress Notes (Signed)
   06/17/2024  Patient ID: Sara [name], female   DOB: 1956/03/25, 68 y.o.   MRN: [id number]     Patient was called to help assist her with getting a continuous glucose monitor sensors. Unfortunately, she did not answer the phone. The phone rang >20 times without a voicemail pick up.    Patient has [employer]. Their preferred continuous glucose monitor is Sara [name].         Plan: Call the Patient back in 2-3 weeks. Today's call was the 2nd unsuccessful phone call.    Cassius DOROTHA Brought, PharmD, BCACP Clinical Pharmacist [phone]

## 2024-06-17 NOTE — Assessment & Plan Note (Signed)

## 2024-06-18 LAB — CMP14+EGFR
ALT: 36 IU/L — ABNORMAL HIGH (ref 0–32)
AST: 23 IU/L (ref 0–40)
Albumin: 4 g/dL (ref 3.9–4.9)
Alkaline Phosphatase: 112 IU/L (ref 44–121)
BUN/Creatinine Ratio: 15 (ref 12–28)
BUN: 19 mg/dL (ref 8–27)
Bilirubin Total: 0.5 mg/dL (ref 0.0–1.2)
CO2: 24 mmol/L (ref 20–29)
Calcium: 10.1 mg/dL (ref 8.7–10.3)
Chloride: 97 mmol/L (ref 96–106)
Creatinine, Ser: 1.26 mg/dL — ABNORMAL HIGH (ref 0.57–1.00)
Globulin, Total: 2.6 g/dL (ref 1.5–4.5)
Glucose: 81 mg/dL (ref 70–99)
Potassium: 4.6 mmol/L (ref 3.5–5.2)
Sodium: 136 mmol/L (ref 134–144)
Total Protein: 6.6 g/dL (ref 6.0–8.5)
eGFR: 46 mL/min/1.73 — ABNORMAL LOW (ref >59)

## 2024-06-18 LAB — LIPID PANEL
Chol/HDL Ratio: 2 ratio (ref 0.0–4.4)
Cholesterol, Total: 110 mg/dL (ref 100–199)
HDL: 56 mg/dL (ref >39)
LDL Chol Calc (NIH): 44 mg/dL (ref 0–99)
Triglycerides: 37 mg/dL (ref 0–149)
VLDL Cholesterol Cal: 10 mg/dL (ref 5–40)

## 2024-06-18 LAB — MICROALBUMIN / CREATININE URINE RATIO
Creatinine, Urine: 106.1 mg/dL
Microalb/Creat Ratio: 43 mg/g{creat} — ABNORMAL HIGH (ref 0–29)
Microalbumin, Urine: 46 ug/mL

## 2024-06-18 LAB — RHEUMATOID FACTOR: Rheumatoid fact SerPl-aCnc: 10.6 [IU]/mL (ref <14.0)

## 2024-06-18 LAB — CYCLIC CITRUL PEPTIDE ANTIBODY, IGG/IGA: Cyclic Citrullin Peptide Ab: 5 U (ref 0–19)

## 2024-06-18 LAB — URIC ACID: Uric Acid: 5 mg/dL (ref 3.0–7.2)

## 2024-06-18 LAB — HEMOGLOBIN A1C
Est. average glucose Bld gHb Est-mCnc: 120 mg/dL
Hgb A1c MFr Bld: 5.8 % — ABNORMAL HIGH (ref 4.8–5.6)

## 2024-06-18 LAB — ANTINUCLEAR ANTIBODIES, IFA: ANA Titer 1: NEGATIVE

## 2024-06-18 LAB — SEDIMENTATION RATE: Sed Rate: 13 mm/h (ref 0–40)

## 2024-06-20 ENCOUNTER — Encounter: Payer: Self-pay | Admitting: Internal Medicine

## 2024-06-28 ENCOUNTER — Other Ambulatory Visit: Payer: Self-pay | Admitting: Licensed Clinical Social Worker

## 2024-06-28 NOTE — Patient Instructions (Signed)
 Visit Information  Thank you for taking time to visit with me today. Please don't hesitate to contact me if I can be of assistance to you before our next scheduled appointment.  Your next care management appointment is by telephone on 10/10 at 1 PM  Please call the care guide team at [phone] if you need to cancel, schedule, or reschedule an appointment.   Please call the Suicide and Crisis Lifeline: 988 go to [facility] [address], [place] [phone]) call 911 if you are experiencing a Mental Health or Behavioral Health Crisis or need someone to talk to.  Rolin Kerns, LCSW [place]  [facility], [facility] Clinical Social Worker Direct Dial: [phone]  Fax: [phone] Website: [email or web address] 1:15 PM

## 2024-06-28 NOTE — Patient Outreach (Signed)
 Complex Care Management   Visit Note  06/28/2024  Name:  Sara [name] MRN: [id number] DOB: 11-08-1955  Situation: Referral received for Complex Care Management related to Caregiver Stress I obtained verbal consent from Patient.  Visit completed with Patient  on the phone  Background:   Past Medical History:  Diagnosis Date   Anxiety    Blood dyscrasia    Chronic kidney disease    Gout    Hyperlipemia    Hypertension    LVH (left ventricular hypertrophy)    12/21/09 Echo -mild asymmetric LVH,EF =>55%   Morbid obesity (HCC)    Myocardial infarction (HCC)    Nausea vomiting and diarrhea    OSA (obstructive sleep apnea)    PAD (peripheral artery disease) (HCC)    Pneumonia    Pre-diabetes    Renal artery stenosis (HCC) 12/13/2010   PTCA and Stent - right   Seizures (HCC)    Syncopal episodes    Thrombocytosis    Type 2 diabetes mellitus (HCC) 07/20/2013    Assessment: Patient Reported Symptoms:  Cognitive Cognitive Status: No symptoms reported, Alert and oriented to person, place, and time, Normal speech and language skills Cognitive/Intellectual Conditions Management [RPT]: None reported or documented in medical history or problem list   Health Maintenance Behaviors: Annual physical exam, Healthy diet, Stress management  Neurological Neurological Review of Symptoms: Not assessed    HEENT HEENT Symptoms Reported: Not assessed      Cardiovascular Cardiovascular Symptoms Reported: Not assessed    Respiratory Respiratory Symptoms Reported: Not assesed    Endocrine Endocrine Symptoms Reported: Hypoglycemia Other symptoms related to hypoglycemia or hyperglycemia: Patient reports sensor often alerts her in the middle of the night of hypoglycemic episodes Is patient diabetic?: Yes Is patient checking blood sugars at home?: Yes List most recent blood sugar readings, include date and time of day: Pt unable to provide, currently in a vehicle    Gastrointestinal  Gastrointestinal Symptoms Reported: No symptoms reported      Genitourinary Genitourinary Symptoms Reported: Not assessed    Integumentary Integumentary Symptoms Reported: Not assessed    Musculoskeletal Musculoskelatal Symptoms Reviewed: Not assessed        Psychosocial Psychosocial Symptoms Reported: Other Other Psychosocial Conditions: Stress Additional Psychological Details: Pt continues to collaborate with community agencies to assist in managing caregiver stress. Current stressors triggered by adjustment of using new blood glucose monitor Behavioral Management Strategies: Coping strategies, Education system, Support system Major Change/Loss/Stressor/Fears (CP): Medical condition, self, Medical condition, family Techniques to [place] with Loss/Stress/Change: Diversional activities, Spiritual practice(s)      06/28/2024    PHQ2-9 Depression Screening   Little interest or pleasure in doing things    Feeling down, depressed, or hopeless    PHQ-2 - Total Score    Trouble falling or staying asleep, or sleeping too much    Feeling tired or having little energy    Poor appetite or overeating     Feeling bad about yourself - or that you are a failure or have let yourself or your family down    Trouble concentrating on things, such as reading the newspaper or watching television    Moving or speaking so slowly that other people could have noticed.  Or the opposite - being so fidgety or restless that you have been moving around a lot more than usual    Thoughts that you would be better off dead, or hurting yourself in some way    PHQ2-9 Total Score  If you checked off any problems, how difficult have these problems made it for you to do your work, take care of things at home, or get along with other people    Depression Interventions/Treatment      There were no vitals filed for this visit.  Medications Reviewed Today     Reviewed by Ezzard Rolin BIRCH, LCSW (Social Worker) on 06/28/24  at 1306  Med List Status: <None>   Medication Order Taking? Sig Documenting Provider Last Dose Status Informant  amLODipine  (NORVASC ) 2.5 MG tablet [id number]  Take 1 tablet (2.5 mg total) by mouth daily. Jarold Medici, MD  Active   anagrelide  ([name]) 1 MG capsule [id number]  TAKE 1 CAPSULE(1 MG) BY MOUTH DAILY Boscia, Heather E, NP  Active   aspirin  EC 81 MG tablet [id number]  Take 81 mg by mouth daily. Swallow whole. [provider]  Active Self  atorvastatin  (LIPITOR) 40 MG tablet [id number]  TAKE 1 TABLET(40 MG) BY MOUTH DAILY Jarold Medici, MD  Active   Cholecalciferol  (VITAMIN D3) 2000 UNITS capsule [id number]  Take 2,000 Units by mouth daily.  [provider]  Active Self  glucose blood (CONTOUR TEST) test strip [id number]  Use once daily to check blood sugars. Jarold Medici, MD  Active   Lacosamide  150 MG TABS [id number]  TAKE 1 TABLET BY MOUTH TWICE DAILY [place], Megan, NP  Active   Magnesium  250 MG TABS [id number]  Take 250 mg by mouth 2 (two) times daily. [provider]  Active Self  metoprolol  tartrate (LOPRESSOR ) 100 MG tablet [id number]  TAKE 1 TABLET(100 MG) BY MOUTH TWICE DAILY Jarold Medici, MD  Active   Prenatal Vit-Fe Fumarate-FA (PRENATAL MULTIVITAMIN/IRON PO) [id number]  Take by mouth. [provider]  Active   telmisartan  (MICARDIS ) 80 MG tablet [id number]  Take 1 tablet (80 mg total) by mouth daily. Jarold Medici, MD  Active             Recommendation:   Continue Current Plan of Care RNCM scheduled appt on 07/10/24  Follow Up Plan:   Telephone follow-up in 1 month  Rolin Ezzard, LCSW [place]  [facility], [facility] Clinical Social Worker Direct Dial: [phone]  Fax: [phone] Website: [email or web address] 1:15 PM

## 2024-07-04 ENCOUNTER — Encounter: Payer: Self-pay | Admitting: Internal Medicine

## 2024-07-10 ENCOUNTER — Telehealth: Payer: Self-pay

## 2024-07-10 ENCOUNTER — Encounter: Payer: Self-pay | Admitting: Internal Medicine

## 2024-07-11 ENCOUNTER — Telehealth: Payer: Self-pay | Admitting: Pharmacist

## 2024-07-11 DIAGNOSIS — E1165 Type 2 diabetes mellitus with hyperglycemia: Secondary | ICD-10-CM

## 2024-07-11 MED ORDER — FREESTYLE LIBRE 3 PLUS SENSOR MISC
3 refills | Status: AC
Start: 2024-07-11 — End: ?

## 2024-07-11 MED ORDER — FREESTYLE LIBRE 3 READER DEVI: 1 refills | Status: DC

## 2024-07-11 NOTE — Progress Notes (Signed)
 07/11/2024 Name: Sara [name] MRN: [id number] DOB: [birth year]  Chief Complaint  Patient presents with   Medication Assistance    Continuous Glucose Monitor    Sara [name] is a 68 y.o. year old female who presented for a telephone visit.   They were referred to the pharmacist by their PCP for assistance in managing medication access with continuous glucose monitoring.   Subjective: Sara [name] is a 68 year old female referred for help with getting a continuous glucose monitor. She has a medical history significant for:   Care Team: Primary Care Provider: Jarold Medici, MD ; Next Scheduled Visit: 11/12/2024 Nurse Visit tomorrow to have CGM training.  Medication Access/Adherence  Current Pharmacy:  [id number] - [place], [place] - [address] AT [address] [place] [address] Phone: [phone] Fax: [phone]   Patient reports affordability concerns with their medications: No  Patient reports access/transportation concerns to their pharmacy: No  Patient reports adherence concerns with their medications:  No      Patient reported getting a Freestyle Libre Sensor sample from the clinic but that it fell off after showering.  She is scheduled for a Nurse visit tomorrow at the clinic to have another sensor placed.  A message was sent over to the clinical staff with a reminder about getting the Libreview app on the patient's phone if possible. An invitation was sent for the Patient to join the [facility] Libreview Portal but the invitation has to be accepted on the app.   Objective:  Lab Results  Component Value Date   HGBA1C 5.8 (H) 06/17/2024    Lab Results  Component Value Date   CREATININE 1.26 (H) 06/17/2024   BUN 19 06/17/2024   NA 136 06/17/2024   K 4.6 06/17/2024   CL 97 06/17/2024   CO2 24 06/17/2024    Lab Results  Component Value Date   CHOL 110 06/17/2024   HDL 56 06/17/2024   LDLCALC 44 06/17/2024    TRIG 37 06/17/2024   CHOLHDL 2.0 06/17/2024    Medications Reviewed Today     Reviewed by Jolee Cassius PARAS, [facility] (Pharmacist) on 07/11/24 at 1447  Med List Status: <None>   Medication Order Taking? Sig Documenting Provider Last Dose Status Informant  amLODipine  (NORVASC ) 2.5 MG tablet [id number] Yes Take 1 tablet (2.5 mg total) by mouth daily. Jarold Medici, MD  Active   anagrelide  ([name]) 1 MG capsule [id number] Yes TAKE 1 CAPSULE(1 MG) BY MOUTH DAILY Boscia, Heather E, NP  Active   AREXVY 120 MCG/0.5ML injection [id number] Yes  [provider]  Active   aspirin  EC 81 MG tablet [id number] Yes Take 81 mg by mouth daily. Swallow whole. [provider]  Active Self  atorvastatin  (LIPITOR) 40 MG tablet [id number] Yes TAKE 1 TABLET(40 MG) BY MOUTH DAILY Jarold Medici, MD  Active   Cholecalciferol  (VITAMIN D3) 2000 UNITS capsule [id number] Yes Take 2,000 Units by mouth daily.  [provider]  Active Self  COMIRNATY syringe [id number] Yes  [provider]  Active   Continuous Glucose Receiver (FREESTYLE LIBRE 3 READER) DEVI [id number] Yes Use to with [facility] Jarold Medici, MD  Active   Continuous Glucose Sensor (FREESTYLE LIBRE 3 PLUS SENSOR) [place] [id number] Yes Change sensor every 15 days. Jarold Medici, MD  Active   glucose blood (CONTOUR TEST) test strip [id number] Yes Use once daily to check blood sugars. Jarold Medici, MD  Active   Lacosamide  150  MG TABS [id number] Yes TAKE 1 TABLET BY MOUTH TWICE DAILY Millikan, Megan, NP  Active   Magnesium  250 MG TABS [id number] Yes Take 250 mg by mouth 2 (two) times daily. [provider]  Active Self  metoprolol  tartrate (LOPRESSOR ) 100 MG tablet [id number] Yes TAKE 1 TABLET(100 MG) BY MOUTH TWICE DAILY Jarold Medici, MD  Active   Prenatal Vit-Fe Fumarate-FA (PRENATAL MULTIVITAMIN/IRON PO) [id number] Yes Take by mouth. [provider]  Active   telmisartan  (MICARDIS ) 80 MG tablet [id number] Yes  Take 1 tablet (80 mg total) by mouth daily. Jarold Medici, MD  Active               06/17/2024    9:13 AM 06/17/2024    8:39 AM 06/11/2024    1:10 PM  Vitals with BMI  Height  5' 1   Weight  157 lbs 155 lbs  BMI  29.68 29.3  Systolic 110 140   Diastolic 60 70   Pulse  79       Assessment/Plan:   Diabetes: - Currently controlled; goal A1c <7%. Cardiorenal risk reduction is optimized.. Blood pressure is at goal <130/80. LDL is at goal.  - Atorvastatin  40 mg filled 06/11/24 #90, Telmisartan  80 mg last filled 05/20/24 #90 -not currently on diabetes therapy said she was on Ozempic  in the past. Current A1c is 5.8% -Suggested Patient get some sensor covers to wear over her Freestyle [place] Sensor:     Follow Up Plan:    Follow up with the Patient in 3-5 business days to be sure she is linked to the clinic and understands the sensor.   Cassius DOROTHA Brought, PharmD, BCACP Clinical Pharmacist [phone]

## 2024-07-12 ENCOUNTER — Ambulatory Visit: Payer: Self-pay

## 2024-07-12 NOTE — Progress Notes (Deleted)
 Patient is in office today for a nurse visit for {NurseVisitChoices:30435}. Patient {NurseVisitDetails:30447}

## 2024-07-18 ENCOUNTER — Telehealth: Payer: Self-pay | Admitting: Pharmacist

## 2024-07-18 DIAGNOSIS — E1165 Type 2 diabetes mellitus with hyperglycemia: Secondary | ICD-10-CM

## 2024-07-18 NOTE — Progress Notes (Addendum)
   07/18/2024  Patient ID: Sara [name], female   DOB: 1956-06-18, 68 y.o.   MRN: [id number]  Called to follow up on [employer]. Unfortunately, the Patient did not answer the phone. HIPAA compliant message was left on her voicemail.  Plan: Call Patient back in 2-3 weeks. Will forward to Jill Simcox to outreach for follow up as well.  Cassius DOROTHA Brought, PharmD, BCACP Clinical Pharmacist [phone]  ADDENDUM  Patient called me back.  HIPAA identifiers were obtained. Patient said she had not picked up her Freestyle [place] yet. She said the one the Practice put on her but it fell off yesterday.  She also said [facility] told her the sensors would be $79 and she could not afford to pay that.  She has [employer] so the sensors and reader should not have a copay.  Called [facility]. Pharmacist was able to get the prescriptions billed correctly but said the sensors would be on order for tomorrow.   Patient communicated understanding.  We reviewed ways to keep the [facility] Patch in place:  Clean skin well first - wash with soap and water, dry completely, and avoid lotions or oils.  Use alcohol  wipes - wipe the site before applying, and let it dry fully.  Choose a low-friction site - back of the upper arm, away from tight clothing or straps.  Press firmly - make sure the adhesive ring is well-sealed all around.  Add an overlay patch/tape - use products like GrifGrips, Skin Grip, or Opsite Flexifix for extra hold.  Skin prep products - apply barrier wipes (like Skin-Tac) before placing sensor to improve stick.  Avoid water in first 1-2 hours - give the adhesive time to bond before showering, swimming, or sweating.  Trim hair if needed - shaving isn't necessary, but clipping long hair can help adhesion.   Plan:    Follow up with Patient next week Send Patient a [employer].  Cassius DOROTHA Brought, PharmD, BCACP Clinical Pharmacist [phone]

## 2024-07-19 ENCOUNTER — Telehealth: Payer: Self-pay | Admitting: Pharmacy Technician

## 2024-07-19 NOTE — Progress Notes (Signed)
   07/19/2024 Name: Sara [name] MRN: [id number] DOB: [birth year]  Patient is appearing on a report for [employer] [place] Metric Diabetes and last engaged with the clinical pharmacist to discuss diabetes on 07/18/2024. Contacted patient today to discuss diabetes management and completed medication review.   Diabetes Plan from last clinical pharmacist appointment:  Plan:    Follow up with Patient next week Send Patient a MyChart Message   Medication Adherence Barriers Identified:  Patient made recommended medication changes per plan: No Patient has not picked up the FreeStyle reader or Sensors yet. She plans to do so this weekend at the latest. She informs she will be following up with PharmD mid week next week. Access issues with any new medication or testing device: No Patient informs price issue with the FreeStyle reader and sensors pharmacy was worked out the pharmacy , PharmD and herself yesterday. Patient is checking blood sugars as prescribed: No She informs the sensor that she had fell off and she has not replaced it yet. Patient was encouraged to read the tips and tricks PharmD sent to her via MyChart before she replaces the sensor. Patient verbalizes understanding. Patient is on no medication for DM!!. Patient's last A1C was 5.8 on 06/17/2024.  Medication Adherence Barriers Addressed/Actions Taken:  Reviewed medication changes per plan from last clinical pharmacist note Educated patient to contact pharmacy regarding new prescriptions Reviewed instructions for monitoring blood sugars at home and reminded patient to keep a written log to review with pharmacist Reminded patient of date/time of upcoming clinical pharmacist follow up and any upcoming PCP/specialists visits. Patient denies transportation barriers to the appointment. Yes  Next clinical pharmacist appointment is scheduled for: TBD  Kate Caddy, CPhT [facility] Population Health Pharmacy Office: [phone] Email:  [email or web address]

## 2024-07-30 ENCOUNTER — Telehealth: Payer: Self-pay | Admitting: Pharmacy Technician

## 2024-07-30 NOTE — Progress Notes (Signed)
   07/30/2024 Name: Sara [name] MRN: [id number] DOB: 05-11-1956  Patient is appearing on a report for [employer] [place] Metric Diabetes and last engaged with the clinical pharmacist to discuss diabetes on 07/11/2024. Contacted patient today to discuss diabetes management and completed medication review.   Diabetes Plan from last clinical pharmacist appointment:  Follow Up Plan:    Follow up with the Patient in 3-5 business days to be sure she is linked to the clinic and understands the sensors.  Medication Adherence Barriers Identified:  Patient made recommended medication changes per plan: Yes Patient was able to pick up the reader and sensors from the pharmacy on 07/19/24. Access issues with any new medication or testing device: No She reports no cost concerns or access issues. Patient is checking blood sugars as prescribed: Yes She was driving at the time of our call but she informs she was able to get a patch put on and is monitoring blood sugars with CGM. She informs this morning after a breakfast of yogurt and bananas her blood sugar was 150 but after about 30 minutes it went down to 90. She informs it sometimes drop low to around 67 or 69. She informs she will get up and drink some juice or zero sugar Gatorade and her blood sugar will rebound in about 1/2 hours to over 100. She is on no medications for diabetes and her last A!C was 5.8 Per Dr First patient is adherent to blood pressure medications. Amlodipine , Metoprolol  and Atorvastatin  were all last filled on 06/12/24 for 90 day supply.,  Medication Adherence Barriers Addressed/Actions Taken:  Reviewed medication changes per plan from last clinical pharmacist note Educated patient to contact pharmacy regarding new prescriptions Reviewed instructions for monitoring blood sugars at home and reminded patient to keep a written log to review with pharmacist Reminded patient of date/time of upcoming clinical pharmacist follow up and any upcoming  PCP/specialists visits. Patient denies transportation barriers to the appointment. Yes  Next clinical pharmacist appointment is scheduled for: TBD  Kate Caddy, CPhT [facility] Population [facility] Pharmacy Office: [phone] Email: [email or web address]

## 2024-07-31 ENCOUNTER — Other Ambulatory Visit: Payer: Self-pay

## 2024-07-31 DIAGNOSIS — D473 Essential (hemorrhagic) thrombocythemia: Secondary | ICD-10-CM

## 2024-08-01 ENCOUNTER — Other Ambulatory Visit: Payer: Self-pay

## 2024-08-01 ENCOUNTER — Inpatient Hospital Stay: Attending: Nurse Practitioner

## 2024-08-01 DIAGNOSIS — E663 Overweight: Secondary | ICD-10-CM | POA: Insufficient documentation

## 2024-08-01 DIAGNOSIS — I252 Old myocardial infarction: Secondary | ICD-10-CM | POA: Insufficient documentation

## 2024-08-01 DIAGNOSIS — D473 Essential (hemorrhagic) thrombocythemia: Secondary | ICD-10-CM | POA: Insufficient documentation

## 2024-08-01 LAB — CBC WITH DIFFERENTIAL (CANCER CENTER ONLY)
Abs Immature Granulocytes: 0.04 K/uL (ref 0.00–0.07)
Basophils Absolute: 0.1 K/uL (ref 0.0–0.1)
Basophils Relative: 1 %
Eosinophils Absolute: 0.3 K/uL (ref 0.0–0.5)
Eosinophils Relative: 5 %
HCT: 41.6 % (ref 36.0–46.0)
Hemoglobin: 13.9 g/dL (ref 12.0–15.0)
Immature Granulocytes: 1 %
Lymphocytes Relative: 19 %
Lymphs Abs: 1.1 K/uL (ref 0.7–4.0)
MCH: 30 pg (ref 26.0–34.0)
MCHC: 33.4 g/dL (ref 30.0–36.0)
MCV: 89.7 fL (ref 80.0–100.0)
Monocytes Absolute: 0.4 K/uL (ref 0.1–1.0)
Monocytes Relative: 8 %
Neutro Abs: 3.8 K/uL (ref 1.7–7.7)
Neutrophils Relative %: 66 %
Platelet Count: 360 K/uL (ref 150–400)
RBC: 4.64 MIL/uL (ref 3.87–5.11)
RDW: 13.8 % (ref 11.5–15.5)
WBC Count: 5.7 K/uL (ref 4.0–10.5)
nRBC: 0 % (ref 0.0–0.2)

## 2024-08-01 NOTE — Patient Outreach (Signed)
 Complex Care Management   Visit Note  08/01/2024  Name:  Sara [name] MRN: [id number] DOB: [birth year]  Situation: Referral received for Complex Care Management related to  Hypertensive heart and renal disease with renal failure, stage 1 through stage 4 or unspecified chronic kidney disease, with heart failure.  I obtained verbal consent from Patient.  Visit completed with Patient on the phone.  Background:   Past Medical History:  Diagnosis Date   Anxiety    Blood dyscrasia    Chronic kidney disease    Gout    Hyperlipemia    Hypertension    LVH (left ventricular hypertrophy)    12/21/09 Echo -mild asymmetric LVH,EF =>55%   Morbid obesity (HCC)    Myocardial infarction (HCC)    Nausea vomiting and diarrhea    OSA (obstructive sleep apnea)    PAD (peripheral artery disease)    Pneumonia    Pre-diabetes    Renal artery stenosis 12/13/2010   PTCA and Stent - right   Seizures (HCC)    Syncopal episodes    Thrombocytosis    Type 2 diabetes mellitus (HCC) 07/20/2013    Assessment: Patient Reported Symptoms:  Cognitive Cognitive Status: Able to follow simple commands, Normal speech and language skills Cognitive/Intellectual Conditions Management [RPT]: None reported or documented in medical history or problem list   Health Maintenance Behaviors: Annual physical exam, Healthy diet, Immunizations, Sleep adequate, Stress management Health Facilitated by: Healthy diet, Rest, Stress management  Neurological Neurological Review of Symptoms: No symptoms reported    HEENT HEENT Symptoms Reported: No symptoms reported      Cardiovascular Cardiovascular Symptoms Reported: No symptoms reported    Respiratory Respiratory Symptoms Reported: No symptoms reported    Endocrine Endocrine Symptoms Reported: Hypoglycemia Is patient diabetic?: Yes Is patient checking blood sugars at home?: Yes List most recent blood sugar readings, include date and time of day: now wearing a libre  sensor for continuous monitoring FBS readings running in the 70's Endocrine Self-Management Outcome: 4 (good)  Gastrointestinal Gastrointestinal Symptoms Reported: No symptoms reported      Genitourinary Genitourinary Symptoms Reported: No symptoms reported    Integumentary Integumentary Symptoms Reported: No symptoms reported    Musculoskeletal Musculoskelatal Symptoms Reviewed: Joint pain Additional Musculoskeletal Details: right index fingers joint stiffness Musculoskeletal Management Strategies: Routine screening, Exercise Musculoskeletal Self-Management Outcome: 4 (good)      Psychosocial Psychosocial Symptoms Reported: No symptoms reported   Major Change/Loss/Stressor/Fears (CP): Medical condition, family Techniques to [place] with Loss/Stress/Change: Diversional activities, Medication, Counseling Quality of Family Relationships: supportive Do you feel physically threatened by others?: No    08/01/2024    PHQ2-9 Depression Screening   Kyera Felan interest or pleasure in doing things    Feeling down, depressed, or hopeless    PHQ-2 - Total Score    Trouble falling or staying asleep, or sleeping too much    Feeling tired or having Mattie Novosel energy    Poor appetite or overeating     Feeling bad about yourself - or that you are a failure or have let yourself or your family down    Trouble concentrating on things, such as reading the newspaper or watching television    Moving or speaking so slowly that other people could have noticed.  Or the opposite - being so fidgety or restless that you have been moving around a lot more than usual    Thoughts that you would be better off dead, or hurting yourself in some way  PHQ2-9 Total Score    If you checked off any problems, how difficult have these problems made it for you to do your work, take care of things at home, or get along with other people    Depression Interventions/Treatment      There were no vitals filed for this  visit.  Medications Reviewed Today     Reviewed by Morgan Clayborne CROME, RN (Registered Nurse) on 08/01/24 at 1231  Med List Status: <None>   Medication Order Taking? Sig Documenting Provider Last Dose Status Informant  amLODipine  (NORVASC ) 2.5 MG tablet [id number]  Take 1 tablet (2.5 mg total) by mouth daily. Jarold Medici, MD  Active   anagrelide  ([name]) 1 MG capsule [id number]  TAKE 1 CAPSULE(1 MG) BY MOUTH DAILY Boscia, Heather E, NP  Active   AREXVY 120 MCG/0.5ML injection [id number]   [provider]  Active   aspirin  EC 81 MG tablet [id number]  Take 81 mg by mouth daily. Swallow whole. [provider]  Active Self  atorvastatin  (LIPITOR) 40 MG tablet [id number]  TAKE 1 TABLET(40 MG) BY MOUTH DAILY Jarold Medici, MD  Active   Cholecalciferol  (VITAMIN D3) 2000 UNITS capsule [id number]  Take 2,000 Units by mouth daily.  [provider]  Active Self  COMIRNATY syringe [id number]   [provider]  Active   Continuous Glucose Receiver (FREESTYLE LIBRE 3 READER) DEVI [id number]  Use to with [facility] Jarold Medici, MD  Active   Continuous Glucose Sensor (FREESTYLE LIBRE 3 PLUS SENSOR) [place] [id number]  Change sensor every 15 days. Jarold Medici, MD  Active   glucose blood (CONTOUR TEST) test strip [id number]  Use once daily to check blood sugars. Jarold Medici, MD  Active   Lacosamide  150 MG TABS [id number]  TAKE 1 TABLET BY MOUTH TWICE DAILY Millikan, Megan, NP  Active   Magnesium  250 MG TABS [id number]  Take 250 mg by mouth 2 (two) times daily. [provider]  Active Self  metoprolol  tartrate (LOPRESSOR ) 100 MG tablet [id number]  TAKE 1 TABLET(100 MG) BY MOUTH TWICE DAILY Jarold Medici, MD  Active   Prenatal Vit-Fe Fumarate-FA (PRENATAL MULTIVITAMIN/IRON PO) [id number]  Take by mouth. [provider]  Active   telmisartan  (MICARDIS ) 80 MG tablet [id number]  Take 1 tablet (80 mg total) by mouth daily. Jarold Medici, MD  Active              Recommendation:   PCP Follow-up as directed  Continue Current Plan of Care  Follow Up Plan:   Closing From:  Complex Care Management  Clayborne Morgan RN BSN CCM [facility]  [facility], [facility] Nurse Care Coordinator  Direct Dial: [phone] Website: [email or web address]

## 2024-08-01 NOTE — Patient Instructions (Signed)
 Visit Information  Thank you for taking time to visit with me today.   Please call the care guide team at [phone] if you need to cancel, schedule, or reschedule an appointment.   Please call 1-800-273-TALK (toll free, 24 hour hotline) if you are experiencing a Mental Health or Behavioral Health Crisis or need someone to talk to.  Clayborne Ly RN BSN CCM [place]  [facility], [facility] Nurse Care Coordinator  Direct Dial: [phone] Website: [email or web address]

## 2024-08-02 ENCOUNTER — Telehealth: Admitting: Licensed Clinical Social Worker

## 2024-08-09 ENCOUNTER — Telehealth: Payer: Self-pay | Admitting: Pharmacist

## 2024-08-09 DIAGNOSIS — E1165 Type 2 diabetes mellitus with hyperglycemia: Secondary | ICD-10-CM

## 2024-08-09 NOTE — Progress Notes (Signed)
   08/09/2024  Patient ID: Sara [name], female   DOB: Feb 12, 1956, 68 y.o.   MRN: [id number]  Patient was called to follow up on [facility] Sensors and blood sugars. Unfortunately, she did not answer the phone. HIPAA compliant message was left on her voicemail.  Reviewed her blood sugars in the Libreview App:    GMI 5.8%  Last HgA1c:  Lab Results  Component Value Date   HGBA1C 5.8 (H) 06/17/2024   HGBA1C 6.0 (H) 01/29/2024   HGBA1C 6.0 (H) 10/16/2023   On statin (SUPD closed) Atorvastatin  40mg  last filled 06/11/24 #90 On ARB Telmisartan  80 mg last filled 05/17/34 #90   Plan: Follow up in 3 months. Ask Pharmacy Technician [facility] Simcox, CPhT) to follow up on low blood sugars.  From report they are within normal limits.   Cassius DOROTHA Brought, PharmD, BCACP Clinical Pharmacist [phone]

## 2024-08-12 ENCOUNTER — Telehealth: Payer: Self-pay | Admitting: Pharmacy Technician

## 2024-08-12 NOTE — Progress Notes (Addendum)
   08/12/2024  Patient ID: Sara [name], female   DOB: 07/19/1956, 68 y.o.   MRN: [id number]  Patient engaged with clinical pharmacist for management of diabetes on 07/11/2024. Outreach by [employer] technician was requested.   Outreached patient to discuss diabetes medication management. The call was answered but then immediately after it was picked up it was hung up. No one ever said anything.  Paulene Tayag, CPhT [facility] Office: [phone] Email: [email or web address]

## 2024-08-14 ENCOUNTER — Telehealth: Payer: Self-pay | Admitting: Pharmacy Technician

## 2024-08-14 NOTE — Progress Notes (Signed)
   08/14/2024 Name: Sara [name] MRN: [id number] DOB: 08-Nov-1955  Patient is appearing for a follow-up visit with the population health pharmacy technician. Last engaged with the clinical pharmacist to discuss diabetes on 07/18/2024. Contacted patient today to discuss diabetes.   Plan from last clinical pharmacist appointment:  Plan:   Follow up with Patient next week Send Patient a MyChart Message.   Medication Adherence Barriers Identified: Patient made recommended medication changes per plan:  Patient is not on any medications for diabetes Access issues with any new medication or testing device: No  Patient is checking blood sugars as prescribed: Yes Patient uses FreeStyle CGM Current blood sugar readings: 116 this morning after breakfast Current CGM readings: Date of Download: 08/14/2024 Data from last 7 days 08/08/24-08/14/24 Time in Goal:  - Time in range 70-180: 93% - Time below range: 6% Patient reports [place] Glucose Events, Total events 5 in this time period Patient reports blood sugars of 89, 80,102,117,132,101,107 Patient informs she eats her heaviest meal of the day no later than 3pm. Patient informs she will have a last bite of food around 8:30-36m. This is something like 1/2 peanut butter sandwich, apple or applesauce, banana, grape juice. She informs if she does this her blood sugar will go down to the 70-80s overnight BUT if she does not do this it will drop to the mid 50, around 56.  Patient informs she also takes some medications such as Lacosamide  right before bedtime as well. Patient wanted Pharmacist to know this as well as she knows some medications can lower blood sugar and she was not sure if some of the ones she was taking would cause this  Medication Adherence Barriers Addressed/Actions Taken:  Reviewed medication changes per plan from last clinical pharmacist note Reviewed instructions for monitoring blood sugars at home and reminded patient to keep a written  log to review with pharmacist Reminded patient of date/time of upcoming clinical pharmacist follow up and any upcoming PCP/specialists visits. Patient denies transportation barriers to the appointment. Yes  Next clinical pharmacist appointment is scheduled for: TBD  Kate Caddy, CPhT [facility] Population [facility] Pharmacy Office: [phone] Email: [email or web address]

## 2024-08-15 ENCOUNTER — Other Ambulatory Visit: Payer: Self-pay

## 2024-08-15 DIAGNOSIS — D473 Essential (hemorrhagic) thrombocythemia: Secondary | ICD-10-CM

## 2024-08-15 MED ORDER — ANAGRELIDE HCL 1 MG PO CAPS: 1.0000 mg | ORAL_CAPSULE | Freq: Every day | ORAL | 5 refills | Status: DC

## 2024-08-16 ENCOUNTER — Telehealth: Payer: Self-pay | Admitting: Pharmacist

## 2024-08-16 DIAGNOSIS — E1122 Type 2 diabetes mellitus with diabetic chronic kidney disease: Secondary | ICD-10-CM

## 2024-08-16 MED ORDER — LANCETS MISC: 3 refills | Status: AC

## 2024-08-16 MED ORDER — LANCET DEVICE MISC: 0 refills | Status: AC

## 2024-08-16 MED ORDER — BLOOD GLUCOSE MONITORING SUPPL DEVI: 0 refills | Status: AC

## 2024-08-16 MED ORDER — BLOOD GLUCOSE TEST VI STRP: ORAL_STRIP | 3 refills | Status: AC

## 2024-08-16 NOTE — Progress Notes (Signed)
   08/16/2024  Patient ID: Sara [name], female   DOB: Jul 25, 1956, 68 y.o.   MRN: [id number]  Patient was called to follow up on low blood sugars. HIPAA identifiers were obtained. Patient expressed to Sara [name] during a follow up call that she had been experiencing night time hypoglycemia.  Her report from Libreview was reviewed:   Patient reported that she does not have a regular blood glucose meter to check her blood sugars to verify the numbers she has been getting.  The majority of her low blood sugars have happened during the night while she is asleep.  She reported eating a granola bar and drinking 1/2 glass of apple juice at night when she gets the alert from her meter.  Patient needs a blood glucose meter to check behind the [facility] numbers to make sure these are not compression lows.  Blood glucose supplies were sent to the Patient's pharmacy per her request under scope of practice per [facility] Health protocol.   She is taking metoprolol  100 mg twice daily which has been shown to mask the symptoms of hypoglycemia and increasing the risk of hypoglycemia.  Lab Results  Component Value Date   HGBA1C 5.8 (H) 06/17/2024   HGBA1C 6.0 (H) 01/29/2024   HGBA1C 6.0 (H) 10/16/2023    Plan: I will have Kate call her back next week to be sure she was able to get the meter, understands how to work it, and review for hypoglycemia. I will call the Patient back in 3 weeks.   Cassius DOROTHA Brought, PharmD, BCACP Clinical Pharmacist [phone] '   Cassius DOROTHA Brought, PharmD, [facility] Clinical Pharmacist [phone]

## 2024-08-23 ENCOUNTER — Telehealth: Payer: Self-pay | Admitting: Pharmacy Technician

## 2024-08-23 NOTE — Progress Notes (Signed)
   08/23/2024  Patient ID: Sara [name], female   DOB: [birth year], 68 y.o.   MRN: [id number]  Patient engaged with clinical pharmacist for management of diabetes on 08/16/2024. Outreach by [employer] technician was requested.   Outreached patient to discuss diabetes medication management. Call was answered but the person who answered the call informed patient was not at home at the time and would not be home until 6pm but they would give patient message to return the call.  Mical Brun, CPhT [facility] Office: [phone] Email: [email or web address]

## 2024-08-26 ENCOUNTER — Telehealth: Payer: Self-pay | Admitting: Pharmacist

## 2024-08-26 ENCOUNTER — Other Ambulatory Visit: Payer: Self-pay | Admitting: Licensed Clinical Social Worker

## 2024-08-26 ENCOUNTER — Telehealth: Payer: Self-pay | Admitting: Pharmacy Technician

## 2024-08-26 ENCOUNTER — Encounter: Payer: Self-pay | Admitting: Radiology

## 2024-08-26 DIAGNOSIS — Z79899 Other long term (current) drug therapy: Secondary | ICD-10-CM

## 2024-08-26 NOTE — Progress Notes (Signed)
   08/26/2024  Patient ID: Sara [name], female   DOB: 24-Mar-1956, 68 y.o.   MRN: [id number]  Patient called and said she has been experiencing low blood sugars. HIPAA identifiers were obtained.  Sara [name] is a 68 year old female with a medical history significant for: CAD status post CABG, CHF, CKD stage 2, HTN, cardiomyopathy, OSA, and absence seizures. She has been experiencing hypoglycemia for quite some time.  She recently started using [employer] and has been having hypoglycemic alerts almost every night and some times during the day.  A blood glucose meter was sent to the Patient's pharmacy for her to check her blood sugar after getting a low blood sugar alert to be sure it is actual hypoglycemia or a compression low since the majority of her events happen while she is sleeping.  Patient said she was not sure if she has been actually having a seizure or just a hypoglycemic event.  Her blood glucose values were reviewed on the [employer] Portal:  She is not taking any medications to treat diabetes.  However, she is on metoprolol  which has been known to increase the risk of hypoglycemia by increasing insulin  sensitivity as well as known to mask the symptoms of hypoglycemia --like tachycardia and tremor/shakiness.  Patient said she did not check her blood sugar with a blood glucose monitor when she was alerted by the [employer] sensor as advised.  She said she would do this from this point on. She also said she would have her husband help her check her blood sugar.  She said he usually gives her syrup when the sensor alerts at night.  Hypoglycemia management was reviewed with the Patient and she was sent a MyChart message with details/reminders about hypoglycemia management.  Plan: Route note to PCP. Follow up with the patient at the end of the week.   Cassius DOROTHA Brought, PharmD, BCACP Clinical Pharmacist [phone]

## 2024-08-26 NOTE — Progress Notes (Signed)
   08/26/2024  Patient ID: [name], female   DOB: May 08, 1956, 68 y.o.   MRN: [id number]  Patient engaged with clinical pharmacist for management of diabetes on 08/16/2024. Outreach by [employer] technician was requested.   Outreached patient to discuss diabetes medication management. Phone just rings and no one and no voicemail picks up.   Rocklin Soderquist, CPhT [facility] Office: [phone] Email: [email or web address]

## 2024-08-26 NOTE — Patient Outreach (Signed)
 Complex Care Management   Visit Note  08/26/2024  Name:  Sara [name] MRN: [id number] DOB: Jan 19, 1956  Situation: Referral received for Complex Care Management related to Stress I obtained verbal consent from Patient.  Visit completed with Patient  on the phone  Background:   Past Medical History:  Diagnosis Date   Anxiety    Blood dyscrasia    Chronic kidney disease    Gout    Hyperlipemia    Hypertension    LVH (left ventricular hypertrophy)    12/21/09 Echo -mild asymmetric LVH,EF =>55%   Morbid obesity (HCC)    Myocardial infarction (HCC)    Nausea vomiting and diarrhea    OSA (obstructive sleep apnea)    PAD (peripheral artery disease)    Pneumonia    Pre-diabetes    Renal artery stenosis 12/13/2010   PTCA and Stent - right   Seizures (HCC)    Syncopal episodes    Thrombocytosis    Type 2 diabetes mellitus (HCC) 07/20/2013    Assessment: Patient Reported Symptoms:  Cognitive Cognitive Status: No symptoms reported, Alert and oriented to person, place, and time, Normal speech and language skills Cognitive/Intellectual Conditions Management [RPT]: None reported or documented in medical history or problem list      Neurological Neurological Review of Symptoms: Not assessed    HEENT HEENT Symptoms Reported: No symptoms reported      Cardiovascular Cardiovascular Symptoms Reported: Not assessed    Respiratory Respiratory Symptoms Reported: No symptoms reported    Endocrine Endocrine Symptoms Reported: Hypoglycemia Is patient diabetic?: Yes Is patient checking blood sugars at home?: Yes Endocrine Comment: Patient spoke with Pharmacist today to discuss endocrine symptoms  Gastrointestinal Gastrointestinal Symptoms Reported: No symptoms reported      Genitourinary Genitourinary Symptoms Reported: Not assessed    Integumentary Integumentary Symptoms Reported: Not assessed    Musculoskeletal Musculoskelatal Symptoms Reviewed: Joint pain Musculoskeletal  Management Strategies: Routine screening Musculoskeletal Comment: Patient informed Pharmacist, who will discuss with PCP      Psychosocial Psychosocial Symptoms Reported: Other Other Psychosocial Conditions: Stress Additional Psychological Details: Pt concerned about endocrine symptoms that occur at night. Validation and encouragement provided. Discussed sleep hygiene and coping skills to assist with management of symptoms Behavioral Management Strategies: Coping strategies, Support system, Adequate rest Major Change/Loss/Stressor/Fears (CP): Medical condition, self Techniques to Cope with Loss/Stress/Change: Diversional activities      08/26/2024    PHQ2-9 Depression Screening   Little interest or pleasure in doing things    Feeling down, depressed, or hopeless    PHQ-2 - Total Score    Trouble falling or staying asleep, or sleeping too much    Feeling tired or having little energy    Poor appetite or overeating     Feeling bad about yourself - or that you are a failure or have let yourself or your family down    Trouble concentrating on things, such as reading the newspaper or watching television    Moving or speaking so slowly that other people could have noticed.  Or the opposite - being so fidgety or restless that you have been moving around a lot more than usual    Thoughts that you would be better off dead, or hurting yourself in some way    PHQ2-9 Total Score    If you checked off any problems, how difficult have these problems made it for you to do your work, take care of things at home, or get along with other people  Depression Interventions/Treatment      There were no vitals filed for this visit.  Medications Reviewed Today     Reviewed by Ezzard Rolin BIRCH, LCSW (Social Worker) on 08/26/24 at 1321  Med List Status: <None>   Medication Order Taking? Sig Documenting Provider Last Dose Status Informant  amLODipine  (NORVASC ) 2.5 MG tablet [id number]  Take 1 tablet (2.5  mg total) by mouth daily. Jarold Medici, MD  Active   anagrelide  ([name]) 1 MG capsule [id number]  Take 1 capsule (1 mg total) by mouth daily. Lanny Callander, MD  Active   AREXVY 120 MCG/0.5ML injection [id number]   [provider]  Active   aspirin  EC 81 MG tablet [id number]  Take 81 mg by mouth daily. Swallow whole. [provider]  Active Self  atorvastatin  (LIPITOR) 40 MG tablet [id number]  TAKE 1 TABLET(40 MG) BY MOUTH DAILY Jarold Medici, MD  Active   Blood Glucose Monitoring Sidney FRENCH [id number]  Dispense based on patient and insurance preference. Use up to four times daily as directed. (FOR ICD-10 E10.9, E11.9). Jarold Medici, MD  Active   Cholecalciferol  (VITAMIN D3) 2000 UNITS capsule [id number]  Take 2,000 Units by mouth daily.  [provider]  Active Self  COMIRNATY syringe [id number]   [provider]  Active   Continuous Glucose Receiver (FREESTYLE LIBRE 3 READER) DEVI [id number]  Use to with [facility] Jarold Medici, MD  Active   Continuous Glucose Sensor (FREESTYLE LIBRE 3 PLUS SENSOR) [place] [id number]  Change sensor every 15 days. Jarold Medici, MD  Active   Glucose Blood (BLOOD GLUCOSE TEST STRIPS) STRP [id number]  Use up to four times daily as directed. (FOR ICD-10 E10.9, E11.9). Jarold Medici, MD  Active   Lacosamide  150 MG TABS [id number]  TAKE 1 TABLET BY MOUTH TWICE DAILY Sherryl Bouchard, NP  Active   Lancet Device MISC [id number]  Dispense based on patient and insurance preference. Use up to four times daily as directed. (FOR ICD-10 E10.9, E11.9). Jarold Medici, MD  Active   Lancets MISC [id number]  Dispense based on patient and insurance preference. Use up to four times daily as directed. (FOR ICD-10 E10.9, E11.9). Jarold Medici, MD  Active   Magnesium  250 MG TABS [id number]  Take 250 mg by mouth 2 (two) times daily. [provider]  Active Self  metoprolol  tartrate (LOPRESSOR ) 100 MG tablet [id number]  TAKE 1 TABLET(100 MG) BY  MOUTH TWICE DAILY Jarold Medici, MD  Active   Prenatal Vit-Fe Fumarate-FA (PRENATAL MULTIVITAMIN/IRON PO) [id number]  Take by mouth. [provider]  Active   telmisartan  (MICARDIS ) 80 MG tablet [id number]  Take 1 tablet (80 mg total) by mouth daily. Jarold Medici, MD  Active             Recommendation:   Continue Current Plan of Care  Follow Up Plan:   Telephone follow-up in 1 month  Rolin Ezzard, LCSW [facility]  [facility], [facility] Clinical Social Worker Direct Dial: [phone]  Fax: [phone] Website: [email or web address] 2:51 PM

## 2024-08-26 NOTE — Patient Instructions (Signed)
 Visit Information  Thank you for taking time to visit with me today. Please don't hesitate to contact me if I can be of assistance to you before our next scheduled appointment.  Your next care management appointment is by telephone on 12/01 at 1:30 PM  Please call the care guide team at [phone] if you need to cancel, schedule, or reschedule an appointment.   Please call the Suicide and Crisis Lifeline: 988 go to [facility] Urgent [facility] [address], [place] [phone]) call 911 if you are experiencing a Mental Health or Behavioral Health Crisis or need someone to talk to.  Rolin Kerns, LCSW [place]  [facility], [facility] Clinical Social Worker Direct Dial: [phone]  Fax: [phone] Website: [email or web address] 2:52 PM

## 2024-08-30 ENCOUNTER — Telehealth: Payer: Self-pay | Admitting: Pharmacy Technician

## 2024-08-30 NOTE — Progress Notes (Signed)
   08/30/2024  Patient ID: Sara [name], female   DOB: Oct 01, 1956, 68 y.o.   MRN: [id number]  Patient engaged with clinical pharmacist for management of diabetes on 08/26/2024. Outreach by [employer] technician was requested.   Outreached patient to discuss diabetes medication management. Phone just rings continuously and no one picks up and no opiton to leave a voicemail.  Gail Creekmore, CPhT [facility] Office: [phone] Email: [email or web address]

## 2024-09-02 ENCOUNTER — Telehealth: Payer: Self-pay | Admitting: Pharmacy Technician

## 2024-09-02 NOTE — Progress Notes (Signed)
   09/02/2024  Patient ID: Sara [name], female   DOB: 07-30-1956, 68 y.o.   MRN: [id number]  Patient engaged with clinical pharmacist for management of diabetes on 08/26/2024. Outreach by [employer] technician was requested.   Outreached patient to discuss diabetes medication management. Call was answered but the person who answered the call informed patient was not at home at the time and would not be home until 5:30pm but they would give patient message to return the call.  Sent patient MyChart message today.  Keiley Levey, CPhT [facility] Office: [phone] Email: [email or web address]

## 2024-09-05 ENCOUNTER — Telehealth: Payer: Self-pay | Admitting: Pharmacy Technician

## 2024-09-05 NOTE — Progress Notes (Signed)
   09/05/2024 Name: Sara [name] MRN: [id number] DOB: [birth year]  Patient is appearing for a follow-up visit with the population health pharmacy technician. Last engaged with the clinical pharmacist to discuss low blood sugars on 08/26/2024. Patient contacted me today to discuss low blood sugars.   Plan from last clinical pharmacist appointment: Plan: Route note to PCP. Follow up with the patient at the end of the week(copy/paste from last note)   Medication Adherence Barriers Identified:   Access issues with any new medication or testing device: Yes Paitent is having trouble using [employer] Patient informs sensors keep falling off. Patient informs she is using the glucometer but is not sure she is using it correctly. Patient informs she will get a family member who is a nurse to help her this weekend. Patient requested a mychart message to be sent to her on Monday reminding her to send over some blood sugar results from now until Monday for PharmD to review. She informs she believes if the glucometer is working correctly that the low blood sugars are getting better. She was unable to provide any blood sugar results today at the time of this call.  Next clinical pharmacist appointment is scheduled for: TBD  Kate Caddy, CPhT [facility] Office: [phone] Email: [email or web address]

## 2024-09-09 ENCOUNTER — Telehealth: Payer: Self-pay | Admitting: Pharmacist

## 2024-09-09 ENCOUNTER — Encounter: Payer: Self-pay | Admitting: Pharmacist

## 2024-09-09 DIAGNOSIS — E162 Hypoglycemia, unspecified: Secondary | ICD-10-CM

## 2024-09-09 NOTE — Progress Notes (Signed)
   09/09/2024  Patient ID: [name], female   DOB: 1955/11/19, 68 y.o.   MRN: [id number]  Patient was called to follow up on blood sugars. HIPAA identifiers were obtained.  Patient is a 68 year old with a medical history significant for: CAD status post CABG, CHF, CKD stage 2, HTN, cardiomyopathy, OSA, and absence seizures. She has been experiencing hypoglycemia for quite some time.  She recently started using [employer] and has been having hypoglycemic alerts almost every night and some times during the day  She was previously instructed to check her blood sugar with a regular blood glucose meter the check behind the [employer] sensor, but she has not been doing this.  She is not currently on any medications to cause hypoglycemia.  However, Metoprolol  can mask the symptoms of hypoglycemia.   The importance of checking her blood sugar with a blood gluose meter when the alarm goes off and 15 minutes after treating the low as discussed.  Her report from [employer] was obtained:    She will be in clinic tomorrow to get her pneumonia vaccine.    She is consistently having low blood sugars per the [facility] but they are still mostly at night and we do not have a blood glucose reading from a regular monitor to verify if they are real lows or compression lows.  The importance of checking the blood sugar after getting a sensor low reading was important. She was counseled to immediately treat the low blood sugar if she receives a notification and she FEELS like her blood sugar is low.  Patient reported her niece was able to get her sensor placed and the application installed.  I will follow up with her in 1 week.  Cassius DOROTHA Brought, PharmD, BCACP Clinical Pharmacist [phone]

## 2024-09-10 ENCOUNTER — Ambulatory Visit: Payer: Self-pay

## 2024-09-10 NOTE — Progress Notes (Signed)
 error

## 2024-09-10 NOTE — Patient Instructions (Signed)
Pneumococcal Conjugate Vaccine (PCV20) Injection What is this medication? PNEUMOCOCCAL CONJUGATE VACCINE (NEU mo KOK al kon ju gate vak SEEN) reduces the risk of pneumococcal disease, such as pneumonia. It does not treat pneumococcal disease. It is still possible to get pneumococcal disease after receiving this vaccine, but the symptoms may be less severe or not last as long. It works by helping your immune system learn how to fight off a future infection. This medicine may be used for other purposes; ask your health care provider or pharmacist if you have questions. COMMON BRAND NAME(S): Prevnar 20 What should I tell my care team before I take this medication? They need to know if you have any of these conditions: Bleeding disorder Fever Immune system problems An unusual or allergic reaction to pneumococcal vaccine, diphtheria toxoid, other vaccines, other medications, foods, dyes, or preservatives Pregnant or trying to get pregnant Breastfeeding How should I use this medication? This vaccine is injected into a muscle. It is given by your care team. A copy of Vaccine Information Statements will be given before each vaccination. Be sure to read this information carefully each time. This sheet may change often. Talk to your care team about the use of this medication in children. While it may be given to children as young as 6 weeks for selected conditions, precautions do apply. Overdosage: If you think you have taken too much of this medicine contact a poison control center or emergency room at once. NOTE: This medicine is only for you. Do not share this medicine with others. What if I miss a dose? This does not apply. This medication is not for regular use. What may interact with this medication? Medications for cancer chemotherapy Medications that suppress your immune function Steroid medications, such as prednisone or cortisone This list may not describe all possible interactions. Give  your health care provider a list of all the medicines, herbs, non-prescription drugs, or dietary supplements you use. Also tell them if you smoke, drink alcohol, or use illegal drugs. Some items may interact with your medicine. What should I watch for while using this medication? Visit your care team regularly. Report any side effects to your care team right away. This vaccine, like all vaccines, may not fully protect everyone. What side effects may I notice from receiving this medication? Side effects that you should report to your care team as soon as possible: Allergic reactions--skin rash, itching, hives, swelling of the face, lips, tongue, or throat Side effects that usually do not require medical attention (report these to your care team if they continue or are bothersome): Fatigue Fever Headache Joint pain Muscle pain Pain, redness, or irritation at injection site This list may not describe all possible side effects. Call your doctor for medical advice about side effects. You may report side effects to FDA at 1-800-FDA-1088. Where should I keep my medication? This vaccine is only given by your care team. It will not be stored at home. NOTE: This sheet is a summary. It may not cover all possible information. If you have questions about this medicine, talk to your doctor, pharmacist, or health care provider.  2024 Elsevier/Gold Standard (2022-03-23 00:00:00)

## 2024-09-23 ENCOUNTER — Other Ambulatory Visit: Payer: Self-pay | Admitting: Licensed Clinical Social Worker

## 2024-09-23 ENCOUNTER — Encounter: Payer: Self-pay | Admitting: Licensed Clinical Social Worker

## 2024-09-23 NOTE — Patient Instructions (Signed)
 Sara [name] - I am sorry I was unable to reach you today for our scheduled appointment. I work with Jarold Medici, MD and am calling to support your healthcare needs. Please contact me at [phone] at your earliest convenience. I look forward to speaking with you soon.   Thank you,  Rolin Kerns, LCSW   [facility], [facility] Clinical Social Worker Direct Dial: [phone]  Fax: [phone] Website: [email or web address] 4:11 PM

## 2024-09-25 NOTE — Patient Instructions (Signed)
 Visit Information  Thank you for taking time to visit with me today. Please don't hesitate to contact me if I can be of assistance to you before our next scheduled appointment.  Your next care management appointment is by telephone on 12/29 at 1:30 PM  Please call the care guide team at [phone] if you need to cancel, schedule, or reschedule an appointment.   Please call the Suicide and Crisis Lifeline: 988 go to [facility] Urgent [facility] [address], [place] [phone]) call 911 if you are experiencing a Mental Health or Behavioral Health Crisis or need someone to talk to.  Rolin Kerns, LCSW [place]  [facility], [facility] Clinical Social Worker Direct Dial: [phone]  Fax: [phone] Website: [email or web address] 9:10 AM

## 2024-09-25 NOTE — Patient Outreach (Signed)
 Complex Care Management   Visit Note  09/23/2024  Name:  Sara [name] MRN: [id number] DOB: 05-14-1956  Situation: Referral received for Complex Care Management related to Caregiver Stress I obtained verbal consent from Patient.  Visit completed with Patient  on the phone  Background:   Past Medical History:  Diagnosis Date   Anxiety    Blood dyscrasia    Chronic kidney disease    Gout    Hyperlipemia    Hypertension    LVH (left ventricular hypertrophy)    12/21/09 Echo -mild asymmetric LVH,EF =>55%   Morbid obesity (HCC)    Myocardial infarction (HCC)    Nausea vomiting and diarrhea    OSA (obstructive sleep apnea)    PAD (peripheral artery disease)    Pneumonia    Pre-diabetes    Renal artery stenosis 12/13/2010   PTCA and Stent - right   Seizures (HCC)    Syncopal episodes    Thrombocytosis    Type 2 diabetes mellitus (HCC) 07/20/2013    Assessment: Patient Reported Symptoms:  Cognitive Cognitive Status: No symptoms reported, Alert and oriented to person, place, and time, Normal speech and language skills Cognitive/Intellectual Conditions Management [RPT]: None reported or documented in medical history or problem list      Neurological Neurological Review of Symptoms: Not assessed    HEENT HEENT Symptoms Reported: Not assessed      Cardiovascular Cardiovascular Symptoms Reported: Not assessed    Respiratory Respiratory Symptoms Reported: Not assesed    Endocrine Endocrine Symptoms Reported: Not assessed    Gastrointestinal Gastrointestinal Symptoms Reported: Not assessed      Genitourinary Genitourinary Symptoms Reported: Not assessed    Integumentary Integumentary Symptoms Reported: Not assessed    Musculoskeletal Musculoskelatal Symptoms Reviewed: Not assessed        Psychosocial Psychosocial Symptoms Reported: Other Other Psychosocial Conditions: Caregiver Stress Behavioral Management Strategies: Coping strategies, Adequate rest, Support  system Behavioral Health Comment: LCSW discussed self-care tips to assist with stress management Major Change/Loss/Stressor/Fears (CP): Medical condition, self, Medical condition, family Techniques to [place] with Loss/Stress/Change: Diversional activities, Spiritual practice(s)      09/25/2024    PHQ2-9 Depression Screening   Little interest or pleasure in doing things    Feeling down, depressed, or hopeless    PHQ-2 - Total Score    Trouble falling or staying asleep, or sleeping too much    Feeling tired or having little energy    Poor appetite or overeating     Feeling bad about yourself - or that you are a failure or have let yourself or your family down    Trouble concentrating on things, such as reading the newspaper or watching television    Moving or speaking so slowly that other people could have noticed.  Or the opposite - being so fidgety or restless that you have been moving around a lot more than usual    Thoughts that you would be better off dead, or hurting yourself in some way    PHQ2-9 Total Score    If you checked off any problems, how difficult have these problems made it for you to do your work, take care of things at home, or get along with other people    Depression Interventions/Treatment      There were no vitals filed for this visit.    Medications Reviewed Today     Reviewed by Ezzard Rolin BIRCH, LCSW (Social Worker) on 09/25/24 at 0901  Med List Status: <None>  Medication Order Taking? Sig Documenting Provider Last Dose Status Informant  amLODipine  (NORVASC ) 2.5 MG tablet [id number]  Take 1 tablet (2.5 mg total) by mouth daily. Jarold Medici, MD  Active   anagrelide  ([name]) 1 MG capsule [id number]  Take 1 capsule (1 mg total) by mouth daily. Lanny Callander, MD  Active   AREXVY 120 MCG/0.5ML injection [id number]   [provider]  Active   aspirin  EC 81 MG tablet [id number]  Take 81 mg by mouth daily. Swallow whole. [provider]  Active Self   atorvastatin  (LIPITOR) 40 MG tablet [id number]  TAKE 1 TABLET(40 MG) BY MOUTH DAILY Jarold Medici, MD  Active   Blood Glucose Monitoring Sidney FRENCH [id number]  Dispense based on patient and insurance preference. Use up to four times daily as directed. (FOR ICD-10 E10.9, E11.9). Jarold Medici, MD  Active   Cholecalciferol  (VITAMIN D3) 2000 UNITS capsule [id number]  Take 2,000 Units by mouth daily.  [provider]  Active Self  COMIRNATY syringe [id number]   [provider]  Active   Continuous Glucose Receiver (FREESTYLE LIBRE 3 READER) DEVI [id number]  Use to with [facility] Jarold Medici, MD  Active   Continuous Glucose Sensor (FREESTYLE LIBRE 3 PLUS SENSOR) [place] [id number]  Change sensor every 15 days. Jarold Medici, MD  Active   Glucose Blood (BLOOD GLUCOSE TEST STRIPS) STRP [id number]  Use up to four times daily as directed. (FOR ICD-10 E10.9, E11.9). Jarold Medici, MD  Active   Lacosamide  150 MG TABS [id number]  TAKE 1 TABLET BY MOUTH TWICE DAILY Millikan, Megan, NP  Active   Lancet Device MISC [id number]  Dispense based on patient and insurance preference. Use up to four times daily as directed. (FOR ICD-10 E10.9, E11.9). Jarold Medici, MD  Active   Lancets MISC [id number]  Dispense based on patient and insurance preference. Use up to four times daily as directed. (FOR ICD-10 E10.9, E11.9). Jarold Medici, MD  Active   Magnesium  250 MG TABS [id number]  Take 250 mg by mouth 2 (two) times daily. [provider]  Active Self  metoprolol  tartrate (LOPRESSOR ) 100 MG tablet [id number]  TAKE 1 TABLET(100 MG) BY MOUTH TWICE DAILY Jarold Medici, MD  Active   Prenatal Vit-Fe Fumarate-FA (PRENATAL MULTIVITAMIN/IRON PO) [id number]  Take by mouth. [provider]  Active   telmisartan  (MICARDIS ) 80 MG tablet [id number]  Take 1 tablet (80 mg total) by mouth daily. Jarold Medici, MD  Active             Recommendation:   Continue Current Plan of Care  Follow  Up Plan:   Telephone follow-up in 1 month  Rolin Kerns, LCSW [facility]  [facility], [facility] Clinical Social Worker Direct Dial: [phone]  Fax: [phone] Website: [email or web address] 9:09 AM

## 2024-10-08 ENCOUNTER — Telehealth: Payer: Medicare PPO | Admitting: Adult Health

## 2024-10-09 ENCOUNTER — Telehealth: Admitting: Adult Health

## 2024-10-09 ENCOUNTER — Telehealth: Payer: Self-pay | Admitting: Pharmacist

## 2024-10-09 DIAGNOSIS — R569 Unspecified convulsions: Secondary | ICD-10-CM | POA: Diagnosis not present

## 2024-10-09 DIAGNOSIS — E1122 Type 2 diabetes mellitus with diabetic chronic kidney disease: Secondary | ICD-10-CM

## 2024-10-09 MED ORDER — LACOSAMIDE 150 MG PO TABS: 1.0000 | ORAL_TABLET | Freq: Two times a day (BID) | ORAL | 5 refills | Status: DC

## 2024-10-09 NOTE — Progress Notes (Unsigned)
 Patient ID: Sara [name], female   DOB: October 06, 1956, 68 y.o.   MRN: [id number]  Patient was called to follow up on blood sugars. HIPAA identifiers were obtained.  Sara [name] is a 68 year old female with multiple medical conditions including but not limited to:  CKD stage 2, hyperlipidemia, hypertension, renal artery stenosis, sleep apnea, type 2 diabetes, absence seizures, seasonal allergies, status post CABG x2.  Patient had been experiencing hypoglycemic episodes during the night. It was difficult to determine if she was having true hypoglycemia or seizure activity.  She had been using the [employer] but said she has just been checking her blood sugars using her blood glucose monitor because she heard about some issues with some of the [employer].  She was assured the issues were with specific lot numbers and that the company shut those sensors down so they were no longer operational and they replaced the faulty sensors. She was encouraged to continue to use her sensors.  She reported that her blood sugars have not been below 70 mg/dl since checking with her blood glucose meter.  She is not currently on any therapy for diabetes due to hypoglycemia.  Lab Results  Component Value Date   HGBA1C 5.8 (H) 06/17/2024   HGBA1C 6.0 (H) 01/29/2024   HGBA1C 6.0 (H) 10/16/2023    Upcoming appointment:    11/12/2024  Cassius DOROTHA Brought, PharmD, BCACP Clinical Pharmacist [phone]

## 2024-10-09 NOTE — Progress Notes (Signed)
 PATIENT: Sara [name] DOB: 12/05/1955  REASON FOR VISIT: follow up HISTORY FROM: patient  Virtual Visit via Video Note  I connected with Sara [name] on 10/09/2024 at  2:15 PM EST by a video enabled telemedicine application located remotely at [facility] and verified that I am speaking with the correct person using two identifiers who was located at their car (not driving) in [place].   I discussed the limitations of evaluation and management by telemedicine and the availability of in person appointments. The patient expressed understanding and agreed to proceed.   PATIENT: Sara [name] DOB: 12-03-1955  REASON FOR VISIT: follow up HISTORY FROM: patient  HISTORY OF PRESENT ILLNESS: Today 10/09/2024  RAELENE TREW is a 68 y.o. female with a history of seizures. Returns today for follow-up.  She reports that she has not had any seizure events.  She remains on lacosamide  150 mg twice a day.  Denies any side effects to the medication.  No new medical issues.  She returns today for an evaluation.   HISTORY 10/02/23:   SHERLEEN PANGBORN is a 68 y.o. female with a history of seizures and obstructive sleep apnea. Returns today for follow-up.  She reports that she has not had any seizure-like episodes since last seen.  Continues on [name]  at 150 mg twice a day.  Reports that she is no longer using the dental device.  Able to complete all ADLs independently.  Operates a librarian, academic independently.  Returns today for an evaluation.   10/03/22: Ms. Cando is a 68 year old female with a history of seizures and obstructive sleep apnea.  She returns today for follow-up.  She denies any additional seizure events.  She remains on [name]  150 mg twice a day.  Operates a motor vehicle without difficulty.  Able to complete all ADLs independently.  She continues to use dental device for sleep apnea but reports that her and Dr. Micky are looking at another type of device.  Has some anxiety with the previous device because it locked in.   03/15/22: Ms. Nihiser is a 67 year old female with a history of seizures and obstructive sleep apnea.  She returns today for follow-up.   Seizures: Reports that she has not had any additional events that she knows of since she started [name]  150 mg twice a day.  Tolerating the medication well. Driving a motor vehicle. Able to do everything for herself.    Obstructive sleep apnea: Patient had repeat testing that showed mild sleep apnea.  She now using a dental device. Feels that it helps but not as good as the cpap. Following with Dr. Micky.    She returns today for follow-up   04/29/21: Ms. Stefano is a 68 year old female with a history of seizures and obstructive sleep apnea.  She returns today for follow-up.  She states that she had another attack in December or January.  The attacks always consist of her making sounds during her sleep.  She called her primary care provider.  Dr. Jarold suggested that she take her sugar or have her husband take her sugar when she is making the sounds.  The patient states that typically when she wakes up she feels fine.  She continues to take [name]  100 mg in the morning and 150mg  at bedtime.  She denies missing any medication.  She returns today for an evaluation.   07/22/20:Ms. Cunning is a 68 year old female with a history of seizures and obstructive sleep apnea.  She returns today for follow-up.  Since her [name]  was increased she reports that her husband has not reported any nocturnal events.  Overall she feels that she has been doing well.   Her CPAP download indicates that she use her machine nightly for compliance of 100%.  She used her machine greater than 4 hours each night.  On average she uses her machine 6 hours and 36 minutes.  Her residual AHI is 1.6 on 9 cm of water with EPR of 2.  Leak in the 95th percentile is 42 L/min.  She states that she does not feel the machine leaking at night.   Overall she feels that she is doing well with the CPAP   HISTORY 03/02/20  Ms. Daise is a 68 year old female with history of seizures and obstructive sleep apnea on CPAP.  Her seizures are typically characterized as staring off.  There was question of possible nocturnal seizure last year when she was taking Ozempic , question possible nocturnal seizure described as chewing around 2 AM.  [name]  level  was normal, she remained on [name]  100 mg twice daily.  She is overall doing well, has reported 1 or 2 episodes her husband describes as, her making a loud noise, he couldn't wake her, when wearing CPAP, he went back to sleep. She has been trying to not eat before bed.  Recent A1c was 6.3.  She reports increase in stress.  She says when her husband notices these episodes, it it when she she is really tired, and not well rested.  She really does not want to go up on [name] , she requires brand-name.  She presents today for evaluation unaccompanied.   Checked CPAP for equipment function, used for more than 4 hours, 29 out of 30 days, 97%, average usage (days used) 6 hours 44 minutes, set pressure 9 cmH2O, leak in the 95th percentile was 48.5, AHI 2.8.    REVIEW OF SYSTEMS: Out of a complete 14 system review of symptoms, the patient complains only of the following symptoms, and all other reviewed systems are negative.  ALLERGIES: Allergies[1]  HOME MEDICATIONS: Outpatient Medications Prior to Visit  Medication Sig Dispense Refill   amLODipine  (NORVASC ) 2.5 MG tablet Take 1 tablet (2.5 mg total) by mouth daily. 90 tablet 2   anagrelide  ([name]) 1 MG capsule Take 1 capsule (1 mg total) by mouth daily. 30 capsule 5   AREXVY 120 MCG/0.5ML injection      aspirin  EC 81 MG tablet Take 81 mg by mouth daily. Swallow whole.     atorvastatin  (LIPITOR) 40 MG tablet TAKE 1 TABLET(40 MG) BY MOUTH DAILY 90 tablet 3   Blood Glucose Monitoring Suppl DEVI Dispense based on patient and insurance preference. Use up to  four times daily as directed. (FOR ICD-10 E10.9, E11.9). 1 each 0   Cholecalciferol  (VITAMIN D3) 2000 UNITS capsule Take 2,000 Units by mouth daily.      COMIRNATY syringe      Continuous Glucose Receiver (FREESTYLE LIBRE 3 READER) DEVI Use to with Freestyle Libre Sensor 1 each 1   Continuous Glucose Sensor (FREESTYLE LIBRE 3 PLUS SENSOR) MISC Change sensor every 15 days. 6 each 3   Glucose Blood (BLOOD GLUCOSE TEST STRIPS) STRP Use up to four times daily as directed. (FOR ICD-10 E10.9, E11.9). 100 strip 3   Lacosamide  150 MG TABS TAKE 1 TABLET BY MOUTH TWICE DAILY 60 tablet 5   Lancet Device MISC Dispense based on patient and insurance preference. Use up to four  times daily as directed. (FOR ICD-10 E10.9, E11.9). 1 each 0   Lancets MISC Dispense based on patient and insurance preference. Use up to four times daily as directed. (FOR ICD-10 E10.9, E11.9). 100 each 3   Magnesium  250 MG TABS Take 250 mg by mouth 2 (two) times daily.     metoprolol  tartrate (LOPRESSOR ) 100 MG tablet TAKE 1 TABLET(100 MG) BY MOUTH TWICE DAILY 180 tablet 3   Prenatal Vit-Fe Fumarate-FA (PRENATAL MULTIVITAMIN/IRON PO) Take by mouth.     telmisartan  (MICARDIS ) 80 MG tablet Take 1 tablet (80 mg total) by mouth daily. 90 tablet 2   No facility-administered medications prior to visit.    PAST MEDICAL HISTORY: Past Medical History:  Diagnosis Date   Anxiety    Blood dyscrasia    Chronic kidney disease    Gout    Hyperlipemia    Hypertension    LVH (left ventricular hypertrophy)    12/21/09 Echo -mild asymmetric LVH,EF =>55%   Morbid obesity (HCC)    Myocardial infarction (HCC)    Nausea vomiting and diarrhea    OSA (obstructive sleep apnea)    PAD (peripheral artery disease)    Pneumonia    Pre-diabetes    Renal artery stenosis 12/13/2010   PTCA and Stent - right   Seizures (HCC)    Syncopal episodes    Thrombocytosis    Type 2 diabetes mellitus (HCC) 07/20/2013    PAST SURGICAL HISTORY: Past Surgical  History:  Procedure Laterality Date   BREAST CYST EXCISION     S/P Benign Right Breast Cyst Removal.   BREAST REDUCTION SURGERY  1995   S/P Bilateral breast reduction   CESAREAN SECTION  1980, 1986   Times  Two.   CORONARY ARTERY BYPASS GRAFT N/A 04/21/2017   Procedure: CORONARY ARTERY BYPASS GRAFTING (CABG) x 2, USING LEFT MAMMARY ARTERY AND RIGHT GREATER SAPHENOUS VEIN HARVESTED ENDOSCOPICALLY;  Surgeon: Lucas Dorise POUR, MD;  Location: [facility] OR;  Service: Open Heart Surgery;  Laterality: N/A;   EXTREMITY CYST EXCISION  1993   left wrist   LEFT HEART CATH AND CORONARY ANGIOGRAPHY N/A 04/17/2017   Procedure: Left Heart Cath and Coronary Angiography;  Surgeon: Dann Candyce RAMAN, MD;  Location: [facility] INVASIVE CV LAB;  Service: Cardiovascular;  Laterality: N/A;   PARTIAL HYSTERECTOMY  1994   renal artery stent placement Right    TEE WITHOUT CARDIOVERSION N/A 04/21/2017   Procedure: TRANSESOPHAGEAL ECHOCARDIOGRAM (TEE);  Surgeon: Lucas Dorise POUR, MD;  Location: [facility] OR;  Service: Open Heart Surgery;  Laterality: N/A;   VENTRAL HERNIA REPAIR N/A 06/17/2021   Procedure: OPEN VENTRAL HERNIA REPAIR WITH MESH;  Surgeon: Kinsinger, Herlene Righter, MD;  Location: [facility] ORS;  Service: General;  Laterality: N/A;    FAMILY HISTORY: Family History  Problem Relation Age of Onset   Heart failure Mother    Diabetes Father    Diabetes Sister    Hypertension Sister    Hypertension Sister    Hypertension Sister    Breast cancer Maternal Aunt        2 maternal aunts had breast cancer.   Breast cancer Maternal Grandmother    Sleep apnea Neg Hx     SOCIAL HISTORY: Social History   Socioeconomic History   Marital status: Married    Spouse name: Carlin   Number of children: 2   Years of education: 16   Highest education level: Not on file  Occupational History   Occupation: TRANSPORTATION COOR.    Employer:  [employer]  Tobacco Use   Smoking status: Never   Smokeless tobacco: Never  Vaping Use    Vaping status: Never Used  Substance and Sexual Activity   Alcohol  use: No    Alcohol /week: 0.0 standard drinks of alcohol    Drug use: No   Sexual activity: Yes  Other Topics Concern   Not on file  Social History Narrative   Patient lives at husband and her son, home with family.   Patient has two adult children.   Patient is not drinking any caffeine.   Patient is working full-time.   Patient has a college education.   Patient is right-handed.   Social Drivers of Health   Tobacco Use: Low Risk (06/17/2024)   Patient History    Smoking Tobacco Use: Never    Smokeless Tobacco Use: Never    Passive Exposure: Not on file  Financial Resource Strain: Low Risk (12/06/2023)   Overall Financial Resource Strain (CARDIA)    Difficulty of Paying Living Expenses: Not hard at all  Food Insecurity: No Food Insecurity (08/01/2024)   Epic    Worried About Programme Researcher, Broadcasting/film/video in the Last Year: Never true    Ran Out of Food in the Last Year: Never true  Transportation Needs: No Transportation Needs (08/01/2024)   Epic    Lack of Transportation (Medical): No    Lack of Transportation (Non-Medical): No  Physical Activity: Sufficiently Active (12/06/2023)   Exercise Vital Sign    Days of Exercise per Week: 7 days    Minutes of Exercise per Session: 30 min  Stress: No Stress Concern Present (12/06/2023)   [employer] of Occupational Health - Occupational Stress Questionnaire    Feeling of Stress : Not at all  Recent Concern: Stress - Stress Concern Present (11/13/2023)   [employer] of Occupational Health - Occupational Stress Questionnaire    Feeling of Stress : Rather much  Social Connections: Socially Integrated (12/06/2023)   Social Connection and Isolation Panel    Frequency of Communication with Friends and Family: More than three times a week    Frequency of Social Gatherings with Friends and Family: Never    Attends Religious Services: More than 4 times per year    Active  Member of Clubs or Organizations: Yes    Attends Banker Meetings: More than 4 times per year    Marital Status: Married  Catering Manager Violence: Not At Risk (08/01/2024)   Epic    Fear of Current or Ex-Partner: No    Emotionally Abused: No    Physically Abused: No    Sexually Abused: No  Depression (PHQ2-9): Low Risk (06/17/2024)   Depression (PHQ2-9)    PHQ-2 Score: 0  Alcohol  Screen: Low Risk (12/06/2023)   Alcohol  Screen    Last Alcohol  Screening Score (AUDIT): 0  Housing: Unknown (08/01/2024)   Epic    Unable to Pay for Housing in the Last Year: No    Number of Times Moved in the Last Year: Not on file    Homeless in the Last Year: No  Utilities: Not At Risk (08/01/2024)   Epic    Threatened with loss of utilities: No  Health Literacy: Adequate Health Literacy (12/06/2023)   B1300 Health Literacy    Frequency of need for help with medical instructions: Never      PHYSICAL EXAM Generalized: Well developed, in no acute distress   Neurological examination  Mentation: Alert oriented to time, place, history taking. Follows  all commands speech and language fluent Cranial nerve II-XII: Facial symmetry noted.   DIAGNOSTIC DATA (LABS, IMAGING, TESTING) - I reviewed patient records, labs, notes, testing and imaging myself where available.  Lab Results  Component Value Date   WBC 5.7 08/01/2024   HGB 13.9 08/01/2024   HCT 41.6 08/01/2024   MCV 89.7 08/01/2024   PLT 360 08/01/2024      Component Value Date/Time   NA 136 06/17/2024 1000   NA 139 08/11/2017 1528   K 4.6 06/17/2024 1000   K 4.2 08/11/2017 1528   CL 97 06/17/2024 1000   CL 102 03/19/2013 1535   CO2 24 06/17/2024 1000   CO2 27 08/11/2017 1528   GLUCOSE 81 06/17/2024 1000   GLUCOSE 96 03/04/2022 1447   GLUCOSE 93 08/11/2017 1528   GLUCOSE 117 (H) 03/19/2013 1535   BUN 19 06/17/2024 1000   BUN 15.0 08/11/2017 1528   CREATININE 1.26 (H) 06/17/2024 1000   CREATININE 1.26 (H) 03/04/2022 1447    CREATININE 1.1 08/11/2017 1528   CALCIUM  10.1 06/17/2024 1000   CALCIUM  9.2 08/11/2017 1528   PROT 6.6 06/17/2024 1000   PROT 7.0 08/11/2017 1528   ALBUMIN  4.0 06/17/2024 1000   ALBUMIN  3.6 08/11/2017 1528   AST 23 06/17/2024 1000   AST 20 03/04/2022 1447   AST 27 08/11/2017 1528   ALT 36 (H) 06/17/2024 1000   ALT 29 03/04/2022 1447   ALT 26 08/11/2017 1528   ALKPHOS 112 06/17/2024 1000   ALKPHOS 88 08/11/2017 1528   BILITOT 0.5 06/17/2024 1000   BILITOT 0.5 03/04/2022 1447   BILITOT 0.37 08/11/2017 1528   GFRNONAA 47 (L) 03/04/2022 1447   GFRAA 49 (L) 11/16/2020 1209   Lab Results  Component Value Date   CHOL 110 06/17/2024   HDL 56 06/17/2024   LDLCALC 44 06/17/2024   TRIG 37 06/17/2024   CHOLHDL 2.0 06/17/2024   Lab Results  Component Value Date   HGBA1C 5.8 (H) 06/17/2024   Lab Results  Component Value Date   VITAMINB12 924 03/13/2019   Lab Results  Component Value Date   TSH 2.390 06/12/2023      ASSESSMENT AND PLAN 68 y.o. year old female  has a past medical history of Anxiety, Blood dyscrasia, Chronic kidney disease, Gout, Hyperlipemia, Hypertension, LVH (left ventricular hypertrophy), Morbid obesity (HCC), Myocardial infarction (HCC), Nausea vomiting and diarrhea, OSA (obstructive sleep apnea), PAD (peripheral artery disease), Pneumonia, Pre-diabetes, Renal artery stenosis (12/13/2010), Seizures (HCC), Syncopal episodes, Thrombocytosis, and Type 2 diabetes mellitus (HCC) (07/20/2013). here with:   Seizures  Continue lacosamide  150 mg twice a day Advised if she has any seizure event she should let us  know Follow-up in 1 year or sooner if needed  * No order type specified * No orders of the defined types were placed in this encounter.    Duwaine Russell, MSN, NP-C 10/09/2024, 2:48 PM [facility] [address], [address] [place], [place] [address] [phone]     [1]  Allergies Allergen Reactions   Allegra Allergy  [Fexofenadine Hcl] Other (See Comments)    Generally not feeling well.    Sulfa Antibiotics Itching

## 2024-10-09 NOTE — Patient Instructions (Addendum)
 Continue lacosamide  150 mg twice a day If you have any seizure events please let us  know.    SEIZURE PRECAUTIONS Per [place]  DMV statutes, patients with seizures are not allowed to drive until they have been seizure-free for six months.    Use caution when using heavy equipment or power tools. Avoid working on ladders or at heights. Take showers instead of baths. Ensure the water temperature is not too high on the home water heater. Do not go swimming alone. Do not lock yourself in a room alone (i.e. bathroom). When caring for infants or small children, sit down when holding, feeding, or changing them to minimize risk of injury to the child in the event you have a seizure. Maintain good sleep hygiene. Avoid alcohol .    If patient has another seizure, call 911 and bring them back to the ED if: A.  The seizure lasts longer than 5 minutes.      B.  The patient doesn't wake shortly after the seizure or has new problems such as difficulty seeing, speaking or moving following the seizure C.  The patient was injured during the seizure D.  The patient has a temperature over 102 F (39C) E.  The patient vomited during the seizure and now is having trouble breathing

## 2024-10-16 ENCOUNTER — Other Ambulatory Visit: Payer: Self-pay | Admitting: Pharmacist

## 2024-10-16 DIAGNOSIS — E1100 Type 2 diabetes mellitus with hyperosmolarity without nonketotic hyperglycemic-hyperosmolar coma (NKHHC): Secondary | ICD-10-CM

## 2024-10-16 NOTE — Progress Notes (Signed)
 "  10/16/2024 Name: Sara [name] MRN: [id number] DOB: 05-20-1956  Chief Complaint  Patient presents with   Medication Management    Freestyle Libre 3     Sara [name] is a 68 y.o. year old female who was referred for medication management by their primary care provider, Jarold Medici, MD. They presented for a face to face visit today.   They were referred to the pharmacist by their PCP for assistance in managing diabetes   Subjective: Sara [name] is a 68 year old female in clinic today to troubleshoot Freestyle Libre 3 sensors. She has a multiple medical conditions including but not limited to: hypertension, Coronary artery disease status post CABG, congestive heart failure, chronic kidney disease state 2, sleep apnea, seasonal allergies, and seizure disorder (absence).  Care Team: Primary Care Provider: Jarold Medici, MD ; Next Scheduled Visit: 11/12/24   Medication Access/Adherence  Current Pharmacy:  [id number] - [place], [place] - [address] AT [address] [place] [address] Phone: [phone] Fax: [phone]   Patient reports affordability concerns with their medications: No  Patient reports access/transportation concerns to their pharmacy: No  Patient reports adherence concerns with their medications:  No      Diabetes:  Current medications:   Not currently on any diabetes medications due to hypoglycemia.  Current glucose readings: Patient said her Taylor Rasher kept going off in her sleep. So she took it off and is in clinic today for sensor application training.   Patient denies hypoglycemic s/sx including  dizziness, shakiness, sweating. Patient denies hyperglycemic symptoms including  polyuria, polydipsia, polyphagia, nocturia, neuropathy, blurred vision.  Macrovascular and Microvascular Risk Reduction:  Statin? yes (atorvastatin   09/08/24 #90); ACEi/ARB? yes (Telmisartan ) Last urinary  albumin /creatinine ratio:  Lab Results  Component Value Date   MICRALBCREAT 43 (H) 06/17/2024   MICRALBCREAT <11 06/12/2023   MICRALBCREAT 12 07/14/2022   MICRALBCREAT <30 11/04/2020   MICRALBCREAT 300 03/13/2019   Last eye exam:  Lab Results  Component Value Date   HMDIABEYEEXA No Retinopathy 07/25/2023   Last foot exam: No foot exam found Tobacco Use:  Tobacco Use: Low Risk (06/17/2024)   Patient History    Smoking Tobacco Use: Never    Smokeless Tobacco Use: Never    Passive Exposure: Not on file     Objective:  Lab Results  Component Value Date   HGBA1C 5.8 (H) 06/17/2024    Lab Results  Component Value Date   CREATININE 1.26 (H) 06/17/2024   BUN 19 06/17/2024   NA 136 06/17/2024   K 4.6 06/17/2024   CL 97 06/17/2024   CO2 24 06/17/2024    Lab Results  Component Value Date   CHOL 110 06/17/2024   HDL 56 06/17/2024   LDLCALC 44 06/17/2024   TRIG 37 06/17/2024   CHOLHDL 2.0 06/17/2024    Medications Reviewed Today     Reviewed by Jolee Cassius PARAS, [facility] (Pharmacist) on 10/16/24 at 0911  Med List Status: <None>   Medication Order Taking? Sig Documenting Provider Last Dose Status Informant  amLODipine  (NORVASC ) 2.5 MG tablet [id number] Yes Take 1 tablet (2.5 mg total) by mouth daily. Jarold Medici, MD  Active   anagrelide  ([name]) 1 MG capsule [id number] Yes Take 1 capsule (1 mg total) by mouth daily. Lanny Callander, MD  Active   AREXVY 120 MCG/0.5ML injection [id number]    Patient not taking: Reported on 10/16/2024   [provider]  Active   aspirin  EC 81  MG tablet [id number] Yes Take 81 mg by mouth daily. Swallow whole. [provider]  Active Self  atorvastatin  (LIPITOR) 40 MG tablet [id number] Yes TAKE 1 TABLET(40 MG) BY MOUTH DAILY Jarold Medici, MD  Active   Blood Glucose Monitoring Sidney FRENCH [id number]  Dispense based on patient and insurance preference. Use up to four times daily as directed. (FOR ICD-10 E10.9, E11.9). Jarold Medici, MD   Active   Cholecalciferol  (VITAMIN D3) 2000 UNITS capsule [id number] Yes Take 2,000 Units by mouth daily.  [provider]  Active Self  COMIRNATY syringe [id number]   [provider]  Active   Continuous Glucose Receiver (FREESTYLE LIBRE 3 READER) DEVI [id number] Yes Use to with [facility] Jarold Medici, MD  Active   Continuous Glucose Sensor (FREESTYLE LIBRE 3 PLUS SENSOR) [place] [id number] Yes Change sensor every 15 days. Jarold Medici, MD  Active   Glucose Blood (BLOOD GLUCOSE TEST STRIPS) STRP [id number] Yes Use up to four times daily as directed. (FOR ICD-10 E10.9, E11.9). Jarold Medici, MD  Active   Lacosamide  150 MG TABS [id number] Yes Take 1 tablet (150 mg total) by mouth 2 (two) times daily. Sherryl Bouchard, NP  Active   Lancet Device MISC [id number] Yes Dispense based on patient and insurance preference. Use up to four times daily as directed. (FOR ICD-10 E10.9, E11.9). Jarold Medici, MD  Active   Lancets MISC [id number] Yes Dispense based on patient and insurance preference. Use up to four times daily as directed. (FOR ICD-10 E10.9, E11.9). Jarold Medici, MD  Active   Magnesium  250 MG TABS [id number] Yes Take 250 mg by mouth 2 (two) times daily. [provider]  Active Self  metoprolol  tartrate (LOPRESSOR ) 100 MG tablet [id number] Yes TAKE 1 TABLET(100 MG) BY MOUTH TWICE DAILY Jarold Medici, MD  Active   Prenatal Vit-Fe Fumarate-FA (PRENATAL MULTIVITAMIN/IRON PO) [id number] Yes Take by mouth. [provider]  Active   telmisartan  (MICARDIS ) 80 MG tablet [id number] Yes Take 1 tablet (80 mg total) by mouth daily. Jarold Medici, MD  Active               Assessment/Plan:   Diabetes: - Currently controlled; goal A1c <7%. Cardiorenal risk reduction is optimized.. Blood pressure is at goal <130/80. LDL is at goal.  - reviewed how to apply/insert the [facility] 3 sensor   Dr. Jarold provided samples of [place]  3:  Lot [id number]  [id number] Exp 01/361/2026  Follow Up Plan:    Follow up with Patient in 1 week.   Cassius DOROTHA Brought, PharmD, BCACP Clinical Pharmacist [phone]    "

## 2024-10-18 ENCOUNTER — Other Ambulatory Visit: Payer: Self-pay | Admitting: Internal Medicine

## 2024-10-18 DIAGNOSIS — I130 Hypertensive heart and chronic kidney disease with heart failure and stage 1 through stage 4 chronic kidney disease, or unspecified chronic kidney disease: Secondary | ICD-10-CM

## 2024-10-21 ENCOUNTER — Telehealth: Payer: Self-pay | Admitting: Licensed Clinical Social Worker

## 2024-10-21 ENCOUNTER — Encounter: Payer: Self-pay | Admitting: Internal Medicine

## 2024-10-21 ENCOUNTER — Encounter: Payer: Self-pay | Admitting: Licensed Clinical Social Worker

## 2024-10-22 ENCOUNTER — Telehealth: Payer: Self-pay | Admitting: Pharmacist

## 2024-10-22 DIAGNOSIS — Z79899 Other long term (current) drug therapy: Secondary | ICD-10-CM

## 2024-10-22 NOTE — Patient Instructions (Signed)
 [name] - I am sorry I was unable to reach you today for our scheduled appointment. I work with Jarold Medici, MD and am calling to support your healthcare needs. Please contact me at [phone] at your earliest convenience. I look forward to speaking with you soon.   Thank you,  Rolin Kerns, LCSW [facility], [facility] Clinical Social Worker Direct Dial: [phone]  Fax: [phone] Website: [email or web address] 9:35 AM

## 2024-10-22 NOTE — Progress Notes (Signed)
" ° °  10/22/2024 Name: Sara [name] MRN: [id number] DOB: January 19, 1956  Chief Complaint  Patient presents with   Medication Management    Freestyle Libre   Patient called to inquire about Freestyle Libre versus her blood glucose monitor.  HIPAA identifiers were obtained.  Patient said she experienced less alarms overnight than she had before after she came into clinic for training on how to place the [place] sensor on her arm.  However, she still had been using her blood glucose monitor and was concerned about the difference in the values between the [facility] sensors and the blood glucose testing strips.  Patient was told the following:  It is common for the FreeStyle Libre sensor and a finger-stick blood glucose meter to show different numbers. This does not necessarily mean that either device is inaccurate. They measure glucose in different ways.  1. Different body fluids are measured  A finger-stick meter measures glucose directly from the blood. The FreeStyle Libre measures glucose in the interstitial fluid, which is the fluid surrounding the bodys cells. Because glucose moves from the blood into this fluid, sensor readings can lag behind blood glucose by about 5-15 minutes.  2. Rapid glucose changes Differences are more noticeable when glucose is changing quickly, such as:  After meals  After insulin  or diabetes medications  During or after exercise  When glucose is dropping  In these situations, finger-stick readings usually reflect the change first.  3. New sensor adjustment period During the first 12-24 hours after a new sensor is placed, readings may be less accurate as the body adjusts to the sensor.  4. Pressure on the sensor (compression lows) Lying on the sensor or applying pressure to it (such as during sleep or from tight clothing) can cause the sensor to read lower than actual blood glucose.  5. Normal measurement variation Both finger-stick meters and  continuous glucose monitors are allowed a small margin of error. Even when both are working properly, the numbers may not match exactly.  6. Hydration status Dehydration can affect sensor readings more than finger-stick readings and may cause differences between the two.  When to use a finger-stick reading:  You have symptoms that do not match the sensor reading  The sensor shows a low glucose value  You are making a treatment decision  You believe the sensor reading may be inaccurate   She had 8 hypoglycemic episodes per [place]:    Most of these happened over night. They could be compression lows as the Patiet says the values are different when she checks with her meter.  She was cautioned to check with her meter when:   She has  symptoms that do not match the sensor reading  The sensor shows a low glucose value  She believes the sensor reading may be inaccurate  Plan: Route note to Provider about night time hypoglycemia.Surveyor, Mining) Send Patient a Wellsite Geologist. Follow up with Patient next week.  Cassius DOROTHA Brought, PharmD, BCACP Clinical Pharmacist [phone]  "

## 2024-10-31 ENCOUNTER — Telehealth: Payer: Self-pay | Admitting: Pharmacist

## 2024-10-31 ENCOUNTER — Other Ambulatory Visit

## 2024-10-31 ENCOUNTER — Ambulatory Visit: Admitting: Hematology

## 2024-10-31 DIAGNOSIS — E1100 Type 2 diabetes mellitus with hyperosmolarity without nonketotic hyperglycemic-hyperosmolar coma (NKHHC): Secondary | ICD-10-CM

## 2024-10-31 NOTE — Progress Notes (Signed)
" ° °  10/31/2024 Name: Sara [name] MRN: [id number] DOB: Aug 02, 1956  Chief Complaint  Patient presents with   Medication Management    Freestyle Libre Sensors    Sara [name] is a 69 y.o. year old female who presented for a telephone visit.   They were referred to the pharmacist by their PCP for assistance in managing diabetes.    Subjective: Sara [name] is a 69 year old female with multiple medical conditions including but not limited to: hypertension, Coronary artery disease status post CABG, congestive heart failure, chronic kidney disease state 2, sleep apnea, seasonal allergies, and seizure disorder (absence).   She called the clinic today because she was/is frustrated with the [employer]. She continues to have compression lows overnight. She reported checking behind the Freestyle with her blood glucose meter and the blood sugars continue to be different (as they should but are not as low as what the sensor is alerting).  Care Team: Primary Care Provider: Jarold Medici, MD ; Next Scheduled Visit: 11/12/2024   Medication Access/Adherence  Current Pharmacy:  [facility] #[id number] - [place], [place] - [address] [place] [address] Phone: [phone] Fax: [phone]   Patient reports affordability concerns with their medications: No  Patient reports access/transportation concerns to their pharmacy: No  Patient reports adherence concerns with their medications:  No      Due to hypoglycemia, she is not currently taking any medications for diabetes.   Objective:  Lab Results  Component Value Date   HGBA1C 5.8 (H) 06/17/2024    Lab Results  Component Value Date   CREATININE 1.26 (H) 06/17/2024   BUN 19 06/17/2024   NA 136 06/17/2024   K 4.6 06/17/2024   CL 97 06/17/2024   CO2 24 06/17/2024    Lab Results  Component Value Date   CHOL 110 06/17/2024   HDL 56 06/17/2024   LDLCALC 44 06/17/2024    TRIG 37 06/17/2024   CHOLHDL 2.0 06/17/2024     Assessment/Plan:   Diabetes: - Currently controlled; goal A1c <7%. Cardiorenal risk reduction is optimized.. Blood pressure is at goal <130/80. LDL is at goal.  - Not currently on any diabetes medications. -She would like to discontinue using the [employer] due to the nighttime alerts. Patient said she continue to use her meter versus using the sensors. She will discuss with Dr. Jarold at her upcoming appointment.   Follow Up Plan:   Follow up after upcoming appointment.   Sara [name], PharmD, BCACP Clinical Pharmacist [phone]       "

## 2024-11-01 ENCOUNTER — Other Ambulatory Visit: Payer: Self-pay

## 2024-11-01 ENCOUNTER — Telehealth: Payer: Self-pay | Admitting: Hematology

## 2024-11-01 DIAGNOSIS — D473 Essential (hemorrhagic) thrombocythemia: Secondary | ICD-10-CM

## 2024-11-01 NOTE — Telephone Encounter (Signed)
 Pt asked me to call her back in 30 mins because she was sleeping

## 2024-11-01 NOTE — Telephone Encounter (Signed)
 Left VM regarding reschedule

## 2024-11-04 ENCOUNTER — Ambulatory Visit: Admitting: Hematology

## 2024-11-04 ENCOUNTER — Other Ambulatory Visit

## 2024-11-11 NOTE — Progress Notes (Unsigned)
 "     [facility]   [phone] Fax:([phone]) [phone]    Patient Care Team: Jarold Medici, MD as PCP - General (Internal Medicine) Croitoru, Jerel, MD as PCP - Cardiology (Cardiology) Arnell Rockney PARAS, RPH (Inactive) (Pharmacist) Lanny Callander, MD as Consulting Physician (Hematology and Oncology) Ezzard Rolin BIRCH, LCSW as VBCI Care Management (Licensed Clinical Social Worker) [facility] Little, Clayborne CROME, RN as VBCI Care Management Jolee Cassius PARAS, [facility] as Pharmacist (Pharmacist) Jolee Cassius PARAS, [facility] as Pharmacist (Pharmacist) Jolee Cassius PARAS, [facility] as Pharmacist (Pharmacist)   CHIEF COMPLAINT: Follow up ET  CURRENT THERAPY: Anagrelide  1 mg daily since 01/2021  INTERVAL HISTORY Ms. Doleman returns for follow up as scheduled. Last seen by Dr. Lanny 04/2024.   ROS   Past Medical History:  Diagnosis Date   Anxiety    Blood dyscrasia    Chronic kidney disease    Gout    Hyperlipemia    Hypertension    LVH (left ventricular hypertrophy)    12/21/09 Echo -mild asymmetric LVH,EF =>55%   Morbid obesity (HCC)    Myocardial infarction (HCC)    Nausea vomiting and diarrhea    OSA (obstructive sleep apnea)    PAD (peripheral artery disease)    Pneumonia    Pre-diabetes    Renal artery stenosis 12/13/2010   PTCA and Stent - right   Seizures (HCC)    Syncopal episodes    Thrombocytosis    Type 2 diabetes mellitus (HCC) 07/20/2013     Past Surgical History:  Procedure Laterality Date   BREAST CYST EXCISION     S/P Benign Right Breast Cyst Removal.   BREAST REDUCTION SURGERY  1995   S/P Bilateral breast reduction   CESAREAN SECTION  1980, 1986   Times  Two.   CORONARY ARTERY BYPASS GRAFT N/A 04/21/2017   Procedure: CORONARY ARTERY BYPASS GRAFTING (CABG) x 2, USING LEFT MAMMARY ARTERY AND RIGHT GREATER SAPHENOUS VEIN HARVESTED ENDOSCOPICALLY;  Surgeon: Lucas Dorise POUR, MD;  Location: [facility] OR;  Service: Open Heart Surgery;  Laterality:  N/A;   EXTREMITY CYST EXCISION  1993   left wrist   LEFT HEART CATH AND CORONARY ANGIOGRAPHY N/A 04/17/2017   Procedure: Left Heart Cath and Coronary Angiography;  Surgeon: Dann Candyce RAMAN, MD;  Location: [facility] INVASIVE CV LAB;  Service: Cardiovascular;  Laterality: N/A;   PARTIAL HYSTERECTOMY  1994   renal artery stent placement Right    TEE WITHOUT CARDIOVERSION N/A 04/21/2017   Procedure: TRANSESOPHAGEAL ECHOCARDIOGRAM (TEE);  Surgeon: Lucas Dorise POUR, MD;  Location: [facility] OR;  Service: Open Heart Surgery;  Laterality: N/A;   VENTRAL HERNIA REPAIR N/A 06/17/2021   Procedure: OPEN VENTRAL HERNIA REPAIR WITH MESH;  Surgeon: Kinsinger, Herlene Righter, MD;  Location: [facility] ORS;  Service: General;  Laterality: N/A;     Outpatient Encounter Medications as of 11/12/2024  Medication Sig   amLODipine  (NORVASC ) 2.5 MG tablet Take 1 tablet (2.5 mg total) by mouth daily.   anagrelide  ([name]) 1 MG capsule Take 1 capsule (1 mg total) by mouth daily.   AREXVY 120 MCG/0.5ML injection  (Patient not taking: Reported on 10/16/2024)   aspirin  EC 81 MG tablet Take 81 mg by mouth daily. Swallow whole.   atorvastatin  (LIPITOR) 40 MG tablet TAKE 1 TABLET(40 MG) BY MOUTH DAILY   Blood Glucose Monitoring Suppl DEVI Dispense based on patient and insurance preference. Use up to four times daily as directed. (FOR ICD-10 E10.9, E11.9).  Cholecalciferol  (VITAMIN D3) 2000 UNITS capsule Take 2,000 Units by mouth daily.    COMIRNATY syringe    Continuous Glucose Receiver (FREESTYLE LIBRE 3 READER) DEVI Use to with Freestyle Libre Sensor   Continuous Glucose Sensor (FREESTYLE LIBRE 3 PLUS SENSOR) MISC Change sensor every 15 days.   Glucose Blood (BLOOD GLUCOSE TEST STRIPS) STRP Use up to four times daily as directed. (FOR ICD-10 E10.9, E11.9).   Lacosamide  150 MG TABS Take 1 tablet (150 mg total) by mouth 2 (two) times daily.   Lancet Device MISC Dispense based on patient and insurance preference. Use up to four times daily as  directed. (FOR ICD-10 E10.9, E11.9).   Lancets MISC Dispense based on patient and insurance preference. Use up to four times daily as directed. (FOR ICD-10 E10.9, E11.9).   Magnesium  250 MG TABS Take 250 mg by mouth 2 (two) times daily.   metoprolol  tartrate (LOPRESSOR ) 100 MG tablet TAKE 1 TABLET(100 MG) BY MOUTH TWICE DAILY   Prenatal Vit-Fe Fumarate-FA (PRENATAL MULTIVITAMIN/IRON PO) Take by mouth.   telmisartan  (MICARDIS ) 80 MG tablet TAKE 1 TABLET BY MOUTH EVERY DAY   No facility-administered encounter medications on file as of 11/12/2024.     There were no vitals filed for this visit. There is no height or weight on file to calculate BMI.   ECOG PERFORMANCE STATUS: {CHL ONC ECOG PS:[phone]}  PHYSICAL EXAM GENERAL:alert, no distress and comfortable SKIN: no rash  EYES: sclera clear NECK: without mass LYMPH:  no palpable cervical or supraclavicular lymphadenopathy  LUNGS: clear with normal breathing effort HEART: regular rate & rhythm, no lower extremity edema ABDOMEN: abdomen soft, non-tender and normal bowel sounds NEURO: alert & oriented x 3 with fluent speech, no focal motor/sensory deficits Breast exam:  PAC without erythema    CBC    Latest Ref Rng & Units 08/01/2024    9:02 AM 05/02/2024    1:05 PM 02/01/2024    1:34 PM  CBC  WBC 4.0 - 10.5 K/uL 5.7  7.9  7.3   Hemoglobin 12.0 - 15.0 g/dL 86.0  86.9  87.1   Hematocrit 36.0 - 46.0 % 41.6  38.8  38.2   Platelets 150 - 400 K/uL 360  440  355       CMP     Latest Ref Rng & Units 06/17/2024   10:00 AM 01/29/2024   12:47 PM 10/16/2023    4:30 PM  CMP  Glucose 70 - 99 mg/dL 81  89  863   BUN 8 - 27 mg/dL 19  20  27    Creatinine 0.57 - 1.00 mg/dL 8.73  8.69  8.69   Sodium 134 - 144 mmol/L 136  139  137   Potassium 3.5 - 5.2 mmol/L 4.6  5.0  5.0   Chloride 96 - 106 mmol/L 97  101  97   CO2 20 - 29 mmol/L 24  24  26    Calcium  8.7 - 10.3 mg/dL 89.8  89.5  89.2   Total Protein 6.0 - 8.5 g/dL 6.6  6.7    Total  Bilirubin 0.0 - 1.2 mg/dL 0.5  0.5    Alkaline Phos 44 - 121 IU/L 112  106    AST 0 - 40 IU/L 23  28    ALT 0 - 32 IU/L 36  33        ASSESSMENT & PLAN: 69 yo female    Essential thrombocythemia, JAK2+ -diagnosed in 2010 with plt 881K -Bone marrow aspirate and  biopsy on 01/20/09 was non-diagnostic. However, megakaryocytes were abundant with normal morphology. There was no clustering or excess blasts seen.  -started Hydrea  in 2010, switched to anagrelide  in 07/2016 due to concern about risk of leukemia. Currently on 1mg  daily, since 01/28/21, tolerating well with no noticeable side effects.     PLAN:  No orders of the defined types were placed in this encounter.     All questions were answered. The patient knows to call the clinic with any problems, questions or concerns. No barriers to learning were detected. I spent *** counseling the patient face to face. The total time spent in the appointment was *** and more than 50% was on counseling, review of test results, and coordination of care.   Sara [name] K Camila Maita, NP 11/11/2024 3:32 PM  "

## 2024-11-12 ENCOUNTER — Inpatient Hospital Stay: Admitting: Nurse Practitioner

## 2024-11-12 ENCOUNTER — Inpatient Hospital Stay: Attending: Nurse Practitioner

## 2024-11-12 ENCOUNTER — Ambulatory Visit: Payer: Self-pay | Admitting: Internal Medicine

## 2024-11-12 ENCOUNTER — Encounter: Payer: Self-pay | Admitting: Nurse Practitioner

## 2024-11-12 VITALS — BP 136/79 | HR 73 | Temp 98.0°F | Resp 17 | Wt 165.9 lb

## 2024-11-12 DIAGNOSIS — E162 Hypoglycemia, unspecified: Secondary | ICD-10-CM | POA: Insufficient documentation

## 2024-11-12 DIAGNOSIS — D473 Essential (hemorrhagic) thrombocythemia: Secondary | ICD-10-CM | POA: Diagnosis not present

## 2024-11-12 LAB — CBC WITH DIFFERENTIAL (CANCER CENTER ONLY)
Abs Immature Granulocytes: 0.03 K/uL (ref 0.00–0.07)
Basophils Absolute: 0.1 K/uL (ref 0.0–0.1)
Basophils Relative: 1 %
Eosinophils Absolute: 0.3 K/uL (ref 0.0–0.5)
Eosinophils Relative: 4 %
HCT: 42.9 % (ref 36.0–46.0)
Hemoglobin: 14.3 g/dL (ref 12.0–15.0)
Immature Granulocytes: 1 %
Lymphocytes Relative: 15 %
Lymphs Abs: 1 K/uL (ref 0.7–4.0)
MCH: 29.9 pg (ref 26.0–34.0)
MCHC: 33.3 g/dL (ref 30.0–36.0)
MCV: 89.6 fL (ref 80.0–100.0)
Monocytes Absolute: 0.6 K/uL (ref 0.1–1.0)
Monocytes Relative: 9 %
Neutro Abs: 4.5 K/uL (ref 1.7–7.7)
Neutrophils Relative %: 70 %
Platelet Count: 376 K/uL (ref 150–400)
RBC: 4.79 MIL/uL (ref 3.87–5.11)
RDW: 13.9 % (ref 11.5–15.5)
WBC Count: 6.4 K/uL (ref 4.0–10.5)
nRBC: 0 % (ref 0.0–0.2)

## 2024-11-12 MED ORDER — ANAGRELIDE HCL 1 MG PO CAPS: 1.0000 mg | ORAL_CAPSULE | Freq: Every day | ORAL | 12 refills | Status: AC

## 2024-11-13 ENCOUNTER — Telehealth: Payer: Self-pay | Admitting: Pharmacist

## 2024-11-13 DIAGNOSIS — E1100 Type 2 diabetes mellitus with hyperosmolarity without nonketotic hyperglycemic-hyperosmolar coma (NKHHC): Secondary | ICD-10-CM

## 2024-11-13 NOTE — Progress Notes (Signed)
" ° °  11/13/2024 Name: Sara [name] MRN: [id number] DOB: 1955-11-15  Chief Complaint  Patient presents with   Medication Management    Hypoglycemia    Sara [name] is a 69 y.o. year old female who presented for a telephone visit.   They were referred to the pharmacist by their PCP for assistance in managing diabetes.   Sara [name] is a 69 year old female with multiple medical conditions including but not limited to: hypertension, Coronary artery disease status post CABG, congestive heart failure, chronic kidney disease state 2, sleep apnea, seasonal allergies, and seizure disorder (absence).   She called the clinic today to express concern about wetting the bed overnight. She did not have a hypoglycemia but was concerned because this has not happened before.   She was seen by Oncology yesterday for thormbocytopenia and had normal platelets.  Her blood sugars from [facility] have been:   Subjective:  Care Team: Primary Care Provider: Jarold Medici, MD   Medication Access/Adherence  Current Pharmacy:  [facility] [phone] - [place],  - [address] [place] [address] Phone: [phone] Fax: [phone]   Patient reports affordability concerns with their medications: No  Patient reports access/transportation concerns to their pharmacy: No  Patient reports adherence concerns with their medications:  No        Objective:  Lab Results  Component Value Date   HGBA1C 5.8 (H) 06/17/2024    Lab Results  Component Value Date   CREATININE 1.26 (H) 06/17/2024   BUN 19 06/17/2024   NA 136 06/17/2024   K 4.6 06/17/2024   CL 97 06/17/2024   CO2 24 06/17/2024    Lab Results  Component Value Date   CHOL 110 06/17/2024   HDL 56 06/17/2024   LDLCALC 44 06/17/2024   TRIG 37 06/17/2024   CHOLHDL 2.0 06/17/2024    Medications Reviewed Today   Medications were not reviewed in this encounter        Assessment/Plan:   Reviewed blood sugars. Spoke with Dr. Jarold about her wetting her clothes in her sleep last night. She suggested the Patient come in acutely and see Dr. Bernardo.  Follow Up Plan:   I will follow up with the Patient in 2 weeks.  Cassius DOROTHA Brought, PharmD, BCACP Clinical Pharmacist [phone]    "

## 2024-11-14 ENCOUNTER — Ambulatory Visit

## 2024-11-14 VITALS — BP 122/82 | HR 72 | Temp 98.3°F | Ht 61.0 in | Wt 163.4 lb

## 2024-11-14 DIAGNOSIS — E1100 Type 2 diabetes mellitus with hyperosmolarity without nonketotic hyperglycemic-hyperosmolar coma (NKHHC): Secondary | ICD-10-CM

## 2024-11-14 DIAGNOSIS — I10 Essential (primary) hypertension: Secondary | ICD-10-CM

## 2024-11-14 DIAGNOSIS — I130 Hypertensive heart and chronic kidney disease with heart failure and stage 1 through stage 4 chronic kidney disease, or unspecified chronic kidney disease: Secondary | ICD-10-CM

## 2024-11-14 DIAGNOSIS — R569 Unspecified convulsions: Secondary | ICD-10-CM

## 2024-11-14 DIAGNOSIS — E11649 Type 2 diabetes mellitus with hypoglycemia without coma: Secondary | ICD-10-CM

## 2024-11-14 DIAGNOSIS — R32 Unspecified urinary incontinence: Secondary | ICD-10-CM

## 2024-11-14 DIAGNOSIS — Z79899 Other long term (current) drug therapy: Secondary | ICD-10-CM

## 2024-11-14 DIAGNOSIS — E1165 Type 2 diabetes mellitus with hyperglycemia: Secondary | ICD-10-CM

## 2024-11-14 LAB — POCT URINALYSIS DIPSTICK
Bilirubin, UA: NEGATIVE
Blood, UA: NEGATIVE
Glucose, UA: NEGATIVE
Ketones, UA: NEGATIVE
Leukocytes, UA: NEGATIVE
Nitrite, UA: NEGATIVE
Protein, UA: NEGATIVE
Spec Grav, UA: 1.01
Urobilinogen, UA: 0.2 U/dL
pH, UA: 5.5

## 2024-11-14 MED ORDER — FREESTYLE LIBRE 3 READER DEVI: 1 refills | Status: AC

## 2024-11-14 MED ORDER — TELMISARTAN 80 MG PO TABS: 80.0000 mg | ORAL_TABLET | Freq: Every day | ORAL | 1 refills | Status: DC

## 2024-11-14 MED ORDER — TELMISARTAN 80 MG PO TABS: 80.0000 mg | ORAL_TABLET | Freq: Every day | ORAL | 1 refills | Status: AC

## 2024-11-14 MED ORDER — LACOSAMIDE 150 MG PO TABS: 1.0000 | ORAL_TABLET | Freq: Two times a day (BID) | ORAL | 3 refills | Status: AC

## 2024-11-14 MED ORDER — LACOSAMIDE 150 MG PO TABS: 1.0000 | ORAL_TABLET | Freq: Two times a day (BID) | ORAL | 3 refills | Status: DC

## 2024-11-14 NOTE — Assessment & Plan Note (Signed)
 She is to continue the amlodipine  2.5 mg once a day, metoprolol  titrate 100 mg twice a day, and the telmisartan  80 mg once a day which was refilled #90 with 1 refill on 11/14/2024.

## 2024-11-14 NOTE — Progress Notes (Signed)
 I,Egan Berkheimer T Emmitt, CMA,acting as a neurosurgeon for Gaither JONELLE Fischer, DO.,have documented all relevant documentation on the behalf of Gaither JONELLE Fischer, DO,as directed by  Gaither JONELLE Fischer, DO while in the presence of Gaither JONELLE Fischer, DO.  Subjective:  Patient ID: Sara [name] , female    DOB: 05/03/1956 , 69 y.o.   MRN: [id number]  Chief Complaint  Patient presents with   Diabetes    Patient called the clinic yesterday to express concern about wetting the bed overnight. She did not have a hypoglycemia but was concerned because this has not happened before.  She was offered an appointment by RS to be seen  today.    Hypertension    HPI  HPI   Past Medical History:  Diagnosis Date   Anxiety    Blood dyscrasia    Chronic kidney disease    Gout    Hyperlipemia    Hypertension    LVH (left ventricular hypertrophy)    12/21/09 Echo -mild asymmetric LVH,EF =>55%   Morbid obesity (HCC)    Myocardial infarction (HCC)    Nausea vomiting and diarrhea    OSA (obstructive sleep apnea)    PAD (peripheral artery disease)    Pneumonia    Pre-diabetes    Renal artery stenosis 12/13/2010   PTCA and Stent - right   Seizures (HCC)    Syncopal episodes    Thrombocytosis    Type 2 diabetes mellitus (HCC) 07/20/2013     Family History  Problem Relation Age of Onset   Heart failure Mother    Diabetes Father    Diabetes Sister    Hypertension Sister    Hypertension Sister    Hypertension Sister    Breast cancer Maternal Aunt        2 maternal aunts had breast cancer.   Breast cancer Maternal Grandmother    Sleep apnea Neg Hx     Current Medications[1]   Allergies[2]   Review of Systems  Constitutional: Negative.   Respiratory: Negative.    Cardiovascular: Negative.   Neurological: Negative.   Psychiatric/Behavioral: Negative.       Today's Vitals   11/14/24 0915  BP: 122/82  Pulse: 72  Temp: 98.3 F (36.8 C)  SpO2: 98%  Weight: 163 lb 6.4 oz (74.1 kg)  Height: 5' 1 (1.549 m)    Body mass index is 30.87 kg/m.  Wt Readings from Last 3 Encounters:  11/14/24 163 lb 6.4 oz (74.1 kg)  11/12/24 165 lb 14.4 oz (75.3 kg)  06/17/24 157 lb (71.2 kg)     Objective:  Physical Exam      Assessment And Plan:  Medication management  Type 2 diabetes mellitus with hyperosmolarity without coma, without long-term current use of insulin  (HCC)  Hypertensive heart and renal disease with renal failure, stage 1 through stage 4 or unspecified chronic kidney disease, with heart failure (HCC)     Return if symptoms worsen or fail to improve.  Patient was given opportunity to ask questions. Patient verbalized understanding of the plan and was able to repeat key elements of the plan. All questions were answered to their satisfaction.  Gaither JONELLE Fischer, DO  I, Gaither JONELLE Fischer, DO, have reviewed all documentation for this visit. The documentation on 11/14/24 for the exam, diagnosis, procedures, and orders are all accurate and complete.   IF YOU HAVE BEEN REFERRED TO A SPECIALIST, IT MAY TAKE 1-2 WEEKS TO SCHEDULE/PROCESS THE REFERRAL. IF YOU HAVE NOT HEARD FROM  US /SPECIALIST IN TWO WEEKS, PLEASE GIVE US  A CALL AT [phone] X [phone].   THE PATIENT IS ENCOURAGED TO PRACTICE SOCIAL DISTANCING DUE TO THE COVID-19 PANDEMIC.       [1]  Current Outpatient Medications:    amLODipine  (NORVASC ) 2.5 MG tablet, Take 1 tablet (2.5 mg total) by mouth daily., Disp: 90 tablet, Rfl: 2   anagrelide  ([name]) 1 MG capsule, Take 1 capsule (1 mg total) by mouth daily., Disp: 30 capsule, Rfl: 12   aspirin  EC 81 MG tablet, Take 81 mg by mouth daily. Swallow whole., Disp: , Rfl:    atorvastatin  (LIPITOR) 40 MG tablet, TAKE 1 TABLET(40 MG) BY MOUTH DAILY, Disp: 90 tablet, Rfl: 3   Blood Glucose Monitoring Suppl DEVI, Dispense based on patient and insurance preference. Use up to four times daily as directed. (FOR ICD-10 E10.9, E11.9)., Disp: 1 each, Rfl: 0   Cholecalciferol  (VITAMIN D3) 2000 UNITS capsule,  Take 2,000 Units by mouth daily. , Disp: , Rfl:    COMIRNATY syringe, , Disp: , Rfl:    Continuous Glucose Receiver (FREESTYLE LIBRE 3 READER) DEVI, Use to with Freestyle Libre Sensor, Disp: 1 each, Rfl: 1   Continuous Glucose Sensor (FREESTYLE LIBRE 3 PLUS SENSOR) MISC, Change sensor every 15 days., Disp: 6 each, Rfl: 3   Glucose Blood (BLOOD GLUCOSE TEST STRIPS) STRP, Use up to four times daily as directed. (FOR ICD-10 E10.9, E11.9)., Disp: 100 strip, Rfl: 3   Lacosamide  150 MG TABS, Take 1 tablet (150 mg total) by mouth 2 (two) times daily., Disp: 60 tablet, Rfl: 5   Lancet Device MISC, Dispense based on patient and insurance preference. Use up to four times daily as directed. (FOR ICD-10 E10.9, E11.9)., Disp: 1 each, Rfl: 0   Lancets MISC, Dispense based on patient and insurance preference. Use up to four times daily as directed. (FOR ICD-10 E10.9, E11.9)., Disp: 100 each, Rfl: 3   Magnesium  250 MG TABS, Take 250 mg by mouth 2 (two) times daily., Disp: , Rfl:    metoprolol  tartrate (LOPRESSOR ) 100 MG tablet, TAKE 1 TABLET(100 MG) BY MOUTH TWICE DAILY, Disp: 180 tablet, Rfl: 3   ONETOUCH VERIO test strip, Use up to four times daily as directed. (FOR ICD-10 E10.9, E11.9)., Disp: , Rfl:    Prenatal Vit-Fe Fumarate-FA (PRENATAL MULTIVITAMIN/IRON PO), Take by mouth., Disp: , Rfl:    telmisartan  (MICARDIS ) 80 MG tablet, TAKE 1 TABLET BY MOUTH EVERY DAY, Disp: 90 tablet, Rfl: 2   AREXVY 120 MCG/0.5ML injection, , Disp: , Rfl:  [2]  Allergies Allergen Reactions   Allegra Allergy [Fexofenadine Hcl] Other (See Comments)    Generally not feeling well.    Sulfa Antibiotics Itching

## 2024-11-14 NOTE — Patient Instructions (Signed)

## 2024-11-14 NOTE — Assessment & Plan Note (Signed)
 Controlled on lacosamide  150 mg twice a day which was refilled #60 with 3 refills on 11/14/2024.  The PDMP was checked on 11/14/2024. Orders:   Lacosamide  150 MG TABS; Take 1 tablet (150 mg total) by mouth 2 (two) times daily.

## 2024-11-14 NOTE — Assessment & Plan Note (Signed)
" °  Orders:   telmisartan  (MICARDIS ) 80 MG tablet; Take 1 tablet (80 mg total) by mouth daily.  "

## 2024-11-14 NOTE — Progress Notes (Signed)
 "  Acute Office Visit  Subjective:     Patient ID: Sara [name], female    DOB: [birth year], 69 y.o.   MRN: [id number]  Chief Complaint  Patient presents with   Diabetes    Patient called the clinic yesterday to express concern about wetting the bed overnight. She did not have a hypoglycemia but was concerned because this has not happened before.  She was offered an appointment by RS to be seen  today.  She also wants to know what she is to do differently at night. She has noticed her sugars dropping at night.   Hypertension    This is a pleasant appearing 69 year old African-American female who is here today because she had an episode of URINARY INCONTINENCE I just passed Wednesday morning which is 11/13/2024.  She states that this is new to her.  She does have NOCTURIA which usually occurs around 2 AM and 4 AM.  She denies any blood in urine, dysuria, increased urinary frequency or any urinary urgency.  She states that she was woken on Wednesday morning on 11/13/2024 with the bed being wet.  She states that this has never happened before.  She does have a past medical history of DIABETES MELLITUS type II that is diet controlled only.  Her last hemoglobin A1c was 5.8% done on 06/17/2024.  She does have a history of SEIZURES and is on lacosamide  150 mg twice a day which she needs a refill for.  She does have a past medical history of HYPERTENSION which she is taking amlodipine  2.5 mg once a day, metoprolol  titrate 100 mg twice a day, and telmisartan  80 mg once a day which she needs a refill on.  She denies any dizziness, headaches, chest pain/chest pressure/chest tightness or any bilateral lower extremity edema.  She does drink a lot of fluids especially before bed.  She states that she drinks orange juice from 7 to 9 PM before bed.  She did see her ONCOLOGIST this past Monday and 11/12/2024 in regards to her thrombocytosis which she has taking [name].    Review of Systems  Constitutional:   Negative for chills, fever and malaise/fatigue.  Respiratory:  Negative for shortness of breath.   Cardiovascular:  Negative for chest pain and leg swelling.  Gastrointestinal:  Negative for abdominal pain.  Genitourinary:  Negative for dysuria, flank pain, frequency, hematuria and urgency.  Neurological:  Negative for dizziness and headaches.       History of seizures.  Does not remember when the last seizure was.        Objective:    Vitals:   11/14/24 0915  BP: 122/82  Pulse: 72  Temp: 98.3 F (36.8 C)  Height: 5' 1 (1.549 m)  Weight: 163 lb 6.4 oz (74.1 kg)  SpO2: 98%  BMI (Calculated): 30.89      Physical Exam Vitals reviewed.  Constitutional:      Appearance: Normal appearance.  HENT:     Head: Normocephalic and atraumatic.  Cardiovascular:     Rate and Rhythm: Normal rate and regular rhythm.     Pulses: Normal pulses.     Heart sounds: Normal heart sounds.  Pulmonary:     Effort: Pulmonary effort is normal.     Breath sounds: Normal breath sounds.  Abdominal:     General: Bowel sounds are normal.     Palpations: Abdomen is soft.     Tenderness: There is no abdominal tenderness. There is no right CVA tenderness or  left CVA tenderness.  Musculoskeletal:     Right lower leg: No edema.     Left lower leg: No edema.  Neurological:     Mental Status: She is alert.  Psychiatric:        Mood and Affect: Mood normal.        Behavior: Behavior normal.     No results found for any visits on 11/14/24.      Assessment & Plan:   Assessment & Plan Urinary incontinence, unspecified type The urinalysis done on 11/14/2024 was negative for any leukocytes or blood.  I recommended to this patient on 11/14/2024 to drink no more than 64 ounces of total fluids a day and to limit her fluid intake after 6 PM taking it with medication only.  She voiced understanding. Orders:   POCT Urinalysis Dipstick (18997)  Medication management     Type 2 diabetes mellitus with  hyperosmolarity without coma, without long-term current use of insulin  (HCC)     Hypoglycemia associated with type 2 diabetes mellitus (HCC) She is a diet-controlled type II diabetic.    Seizures (HCC) Controlled on lacosamide  150 mg twice a day which was refilled #60 with 3 refills on 11/14/2024.  The PDMP was checked on 11/14/2024. Orders:   Lacosamide  150 MG TABS; Take 1 tablet (150 mg total) by mouth 2 (two) times daily.  Hypertensive heart and renal disease with renal failure, stage 1 through stage 4 or unspecified chronic kidney disease, with heart failure (HCC)  Orders:   telmisartan  (MICARDIS ) 80 MG tablet; Take 1 tablet (80 mg total) by mouth daily.  Primary hypertension She is to continue the amlodipine  2.5 mg once a day, metoprolol  titrate 100 mg twice a day, and the telmisartan  80 mg once a day which was refilled #90 with 1 refill on 11/14/2024.     Return as scheduled for DM/hgbA1c.  Sara JONELLE Fischer, DO   "

## 2024-11-14 NOTE — Assessment & Plan Note (Addendum)
 SABRA

## 2024-11-14 NOTE — Addendum Note (Signed)
 Addended by: BERNARDO CARWIN on: 11/14/2024 02:27 PM   Modules accepted: Orders

## 2024-11-17 ENCOUNTER — Ambulatory Visit: Payer: Self-pay | Admitting: Internal Medicine

## 2024-11-18 ENCOUNTER — Encounter: Payer: Self-pay | Admitting: Internal Medicine

## 2024-11-20 ENCOUNTER — Telehealth: Payer: Self-pay | Admitting: Licensed Clinical Social Worker

## 2024-11-25 ENCOUNTER — Encounter: Payer: Self-pay | Admitting: Internal Medicine

## 2024-11-25 ENCOUNTER — Telehealth: Admitting: Internal Medicine

## 2024-11-25 DIAGNOSIS — E11649 Type 2 diabetes mellitus with hypoglycemia without coma: Secondary | ICD-10-CM | POA: Insufficient documentation

## 2024-11-25 DIAGNOSIS — I130 Hypertensive heart and chronic kidney disease with heart failure and stage 1 through stage 4 chronic kidney disease, or unspecified chronic kidney disease: Secondary | ICD-10-CM

## 2024-11-25 DIAGNOSIS — E1169 Type 2 diabetes mellitus with other specified complication: Secondary | ICD-10-CM

## 2024-11-25 DIAGNOSIS — I2510 Atherosclerotic heart disease of native coronary artery without angina pectoris: Secondary | ICD-10-CM

## 2024-11-25 DIAGNOSIS — N1831 Chronic kidney disease, stage 3a: Secondary | ICD-10-CM

## 2024-11-25 DIAGNOSIS — I5032 Chronic diastolic (congestive) heart failure: Secondary | ICD-10-CM

## 2024-11-25 DIAGNOSIS — R569 Unspecified convulsions: Secondary | ICD-10-CM

## 2024-11-25 NOTE — Progress Notes (Addendum)
 "  Virtual Visit via Video Note  I,Victoria T Hamilton, CMA,acting as a neurosurgeon for [name], MD.,have documented all relevant documentation on the behalf of [name], MD,as directed by  [name], MD while in the presence of [name], MD.  I connected with [name] on 11/25/24 at 10:40 AM EST by a video enabled telemedicine application and verified that I am speaking with the correct person using two identifiers.  Patient Location: Home Provider Location: Home Office  I discussed the limitations, risks, security, and privacy concerns of performing an evaluation and management service by video and the availability of in person appointments. I also discussed with the patient that there may be a patient responsible charge related to this service. The patient expressed understanding and agreed to proceed.  Subjective: PCP: [name], MD  Chief Complaint  Patient presents with   Diabetes    Patient presents today for dm & bp follow up. She reports compliance with medications. Denies headache, chest pain & sob. She has no specific questions or concerns.     Hypertension    Discussed the use of AI scribe software for clinical note transcription with the patient, who gave verbal consent to proceed.  History of Present Illness Sara [name] is a 69 year old female who presents for a diabetes check.  She experiences nocturnal hypoglycemia with blood sugar levels dropping to 56-60 mg/dL, despite not being on any diabetes medication. She uses a Libre sensor to monitor her blood sugar and confirms these low readings with fingerstick tests. To manage this, she consumes peanut butter and orange juice before bed to maintain her blood sugar levels. She has a history of seizures, previously thought to be related to low blood sugar levels at night.  Her current medications include amlodipine  2.5 mg for blood pressure, anagrelide , aspirin , atorvastatin ,  metoprolol , telmisartan , magnesium , and vitamin D3. She takes most of her medications in the morning, with some adjustments to her routine to balance her intake throughout the day. She has not yet started taking vitamin B complex but plans to incorporate it into her morning routine.  She drinks about eight 16-ounce bottles of water daily. She has a history of stage 3A kidney function, and an ultrasound in 2022 showed signs of high blood pressure but no other significant findings.  She had cataract surgery last year, around April or May, but the details and records of this procedure are not currently available in her chart. She plans to contact the facility where the surgery was performed to obtain these records.    ROS: Per HPI  Current Outpatient Medications:    amLODipine  (NORVASC ) 2.5 MG tablet, Take 1 tablet (2.5 mg total) by mouth daily., Disp: 90 tablet, Rfl: 2   anagrelide  ([name]) 1 MG capsule, Take 1 capsule (1 mg total) by mouth daily., Disp: 30 capsule, Rfl: 12   aspirin  EC 81 MG tablet, Take 81 mg by mouth daily. Swallow whole., Disp: , Rfl:    atorvastatin  (LIPITOR) 40 MG tablet, TAKE 1 TABLET(40 MG) BY MOUTH DAILY, Disp: 90 tablet, Rfl: 3   Blood Glucose Monitoring Suppl DEVI, Dispense based on patient and insurance preference. Use up to four times daily as directed. (FOR ICD-10 E10.9, E11.9)., Disp: 1 each, Rfl: 0   Cholecalciferol  (VITAMIN D3) 2000 UNITS capsule, Take 2,000 Units by mouth daily. , Disp: , Rfl:    COMIRNATY syringe, , Disp: , Rfl:    Continuous Glucose Receiver (  FREESTYLE LIBRE 3 READER) DEVI, Use to with Freestyle Libre Sensor, Disp: 1 each, Rfl: 1   Continuous Glucose Sensor (FREESTYLE LIBRE 3 PLUS SENSOR) MISC, Change sensor every 15 days., Disp: 6 each, Rfl: 3   Glucose Blood (BLOOD GLUCOSE TEST STRIPS) STRP, Use up to four times daily as directed. (FOR ICD-10 E10.9, E11.9)., Disp: 100 strip, Rfl: 3   Lacosamide  150 MG TABS, Take 1 tablet (150 mg total) by  mouth 2 (two) times daily., Disp: 60 tablet, Rfl: 3   Lancet Device MISC, Dispense based on patient and insurance preference. Use up to four times daily as directed. (FOR ICD-10 E10.9, E11.9)., Disp: 1 each, Rfl: 0   Lancets MISC, Dispense based on patient and insurance preference. Use up to four times daily as directed. (FOR ICD-10 E10.9, E11.9)., Disp: 100 each, Rfl: 3   Magnesium  250 MG TABS, Take 250 mg by mouth 2 (two) times daily., Disp: , Rfl:    metoprolol  tartrate (LOPRESSOR ) 100 MG tablet, TAKE 1 TABLET(100 MG) BY MOUTH TWICE DAILY, Disp: 180 tablet, Rfl: 3   ONETOUCH VERIO test strip, Use up to four times daily as directed. (FOR ICD-10 E10.9, E11.9)., Disp: , Rfl:    Prenatal Vit-Fe Fumarate-FA (PRENATAL MULTIVITAMIN/IRON PO), Take by mouth., Disp: , Rfl:    telmisartan  (MICARDIS ) 80 MG tablet, Take 1 tablet (80 mg total) by mouth daily., Disp: 90 tablet, Rfl: 1  Observations/Objective: There were no vitals filed for this visit. BP Readings from Last 3 Encounters:  11/14/24 122/82  11/12/24 136/79  06/17/24 110/60    Physical Exam Vitals and nursing note reviewed.  Constitutional:      Appearance: Normal appearance.  HENT:     Head: Normocephalic and atraumatic.  Eyes:     Extraocular Movements: Extraocular movements intact.  Pulmonary:     Effort: Pulmonary effort is normal.  Musculoskeletal:     Cervical back: Normal range of motion.  Neurological:     Mental Status: She is alert.     Assessment and Plan: Hyperlipidemia associated with type 2 diabetes mellitus (HCC) Assessment & Plan: Hyperlipidemia managed with atorvastatin . - Continue atorvastatin . - LDL goal is less than 70.  Orders: -     Hemoglobin A1c; Future -     CMP14+EGFR; Future  Type 2 diabetes mellitus with stage 3a chronic kidney disease, without long-term current use of insulin  (HCC) Assessment & Plan: Hyperlipidemia managed with atorvastatin . - Continue atorvastatin . - LDL goal is less  than 70.  Orders: -     Hemoglobin A1c; Future -     CMP14+EGFR; Future -     PTH, intact and calcium ; Future -     Phosphorus; Future -     Protein electrophoresis, serum; Future  Hypoglycemia associated with type 2 diabetes mellitus (HCC) Assessment & Plan: Nocturnal hypoglycemia without medication influence. Kidney function stable, but excessive water intake may affect electrolytes. - Referred to endocrinologist for hypoglycemia evaluation. - Ordered liver and kidney function tests, insulin  levels. - Advised to reduce water intake to six bottles daily. - Scheduled fasting lab work for Wednesday with orange juice available. - Requested cataract surgery and diabetic eye exam records.  Orders: -     Ambulatory referral to Endocrinology -     Insulin  and C-Peptide; Future  Hypertensive heart and renal disease with renal failure, stage 1 through stage 4 or unspecified chronic kidney disease, with heart failure (HCC) Assessment & Plan: Chronic, fair control. Previous reading well controlled.  Managed with amlodipine ,  metoprolol , and telmisartan .  - Continue with current meds.  - Follow low sodium diet.     CAD s/p CABG  Assessment & Plan: Chronic, LDL goal is less than 55.  She will continue with metoprolol , atorvastatin  and aspirin .  - Follow heart healthy lifestyle.    Chronic diastolic CHF (congestive heart failure) (HCC) Assessment & Plan: Chronic, sx are stable. July 2024 echo results reviewed in detail. She is encouraged to follow a low sodium diet. Most recent Cardiology notes reviewed.   Seizures (HCC) Assessment & Plan: Seizures possibly linked to nocturnal hypoglycemia. - Referred to endocrinologist for hypoglycemia evaluation. - Continue with management by Neurology.    Follow Up Instructions: Return for 4 month dm f/u..   I discussed the assessment and treatment plan with the patient. The patient was provided an opportunity to ask questions, and all were  answered. The patient agreed with the plan and demonstrated an understanding of the instructions.   The patient was advised to call back or seek an in-person evaluation if the symptoms worsen or if the condition fails to improve as anticipated.  The above assessment and management plan was discussed with the patient. The patient verbalized understanding of and has agreed to the management plan.   I, [name], MD, have reviewed all documentation for this visit. The documentation on 11/25/24 for the exam, diagnosis, procedures, and orders are all accurate and complete.   "

## 2024-11-25 NOTE — Assessment & Plan Note (Signed)
 Chronic, sx are stable. July 2024 echo results reviewed in detail. She is encouraged to follow a low sodium diet. Most recent Cardiology notes reviewed.

## 2024-11-25 NOTE — Patient Instructions (Signed)

## 2024-11-25 NOTE — Assessment & Plan Note (Signed)
 Chronic, fair control. Previous reading well controlled.  Managed with amlodipine , metoprolol , and telmisartan .  - Continue with current meds.  - Follow low sodium diet.

## 2024-11-25 NOTE — Assessment & Plan Note (Signed)
 Chronic, LDL goal is less than 55.  She will continue with metoprolol , atorvastatin  and aspirin .  - Follow heart healthy lifestyle.

## 2024-11-25 NOTE — Assessment & Plan Note (Signed)
 Nocturnal hypoglycemia without medication influence. Kidney function stable, but excessive water intake may affect electrolytes. - Referred to endocrinologist for hypoglycemia evaluation. - Ordered liver and kidney function tests, insulin  levels. - Advised to reduce water intake to six bottles daily. - Scheduled fasting lab work for Wednesday with orange juice available. - Requested cataract surgery and diabetic eye exam records.

## 2024-11-25 NOTE — Assessment & Plan Note (Signed)
 Hyperlipidemia managed with atorvastatin . - Continue atorvastatin . - LDL goal is less than 70.

## 2024-11-27 ENCOUNTER — Other Ambulatory Visit

## 2024-11-27 DIAGNOSIS — E1122 Type 2 diabetes mellitus with diabetic chronic kidney disease: Secondary | ICD-10-CM

## 2024-11-27 DIAGNOSIS — E1169 Type 2 diabetes mellitus with other specified complication: Secondary | ICD-10-CM

## 2024-11-27 DIAGNOSIS — E11649 Type 2 diabetes mellitus with hypoglycemia without coma: Secondary | ICD-10-CM

## 2024-11-28 LAB — CMP14+EGFR
ALT: 58 [IU]/L — ABNORMAL HIGH (ref 0–32)
AST: 31 [IU]/L (ref 0–40)
Albumin: 3.9 g/dL (ref 3.9–4.9)
Alkaline Phosphatase: 115 [IU]/L (ref 49–135)
BUN/Creatinine Ratio: 17 (ref 12–28)
BUN: 21 mg/dL (ref 8–27)
Bilirubin Total: 0.5 mg/dL (ref 0.0–1.2)
CO2: 22 mmol/L (ref 20–29)
Calcium: 10.2 mg/dL (ref 8.7–10.3)
Chloride: 104 mmol/L (ref 96–106)
Creatinine, Ser: 1.25 mg/dL — ABNORMAL HIGH (ref 0.57–1.00)
Globulin, Total: 2.7 g/dL (ref 1.5–4.5)
Glucose: 76 mg/dL (ref 70–99)
Potassium: 4.5 mmol/L (ref 3.5–5.2)
Sodium: 141 mmol/L (ref 134–144)
Total Protein: 6.6 g/dL (ref 6.0–8.5)
eGFR: 46 mL/min/{1.73_m2} — ABNORMAL LOW

## 2024-11-28 LAB — HEMOGLOBIN A1C
Est. average glucose Bld gHb Est-mCnc: 143 mg/dL
Hgb A1c MFr Bld: 6.6 % — ABNORMAL HIGH (ref 4.8–5.6)

## 2024-11-28 LAB — INSULIN AND C-PEPTIDE, SERUM
C-Peptide: 2.2 ng/mL (ref 1.1–4.4)
INSULIN: 5.9 u[IU]/mL (ref 2.6–24.9)

## 2024-11-28 LAB — PHOSPHORUS: Phosphorus: 3.4 mg/dL (ref 3.0–4.3)

## 2024-11-28 LAB — PTH, INTACT AND CALCIUM: PTH: 39 pg/mL (ref 15–65)

## 2024-11-29 LAB — PROTEIN ELECTROPHORESIS, SERUM
A/G Ratio: 1 (ref 0.7–1.7)
Albumin ELP: 3.2 g/dL (ref 2.9–4.4)
Alpha 1: 0.2 g/dL (ref 0.0–0.4)
Alpha 2: 0.8 g/dL (ref 0.4–1.0)
Beta: 1.1 g/dL (ref 0.7–1.3)
Gamma Globulin: 1.2 g/dL (ref 0.4–1.8)
Globulin, Total: 3.3 g/dL (ref 2.2–3.9)
Total Protein: 6.5 g/dL (ref 6.0–8.5)

## 2024-12-18 ENCOUNTER — Telehealth: Admitting: Licensed Clinical Social Worker

## 2025-01-08 ENCOUNTER — Ambulatory Visit: Payer: Self-pay

## 2025-05-13 ENCOUNTER — Inpatient Hospital Stay: Admitting: Hematology

## 2025-05-13 ENCOUNTER — Inpatient Hospital Stay

## 2025-06-19 ENCOUNTER — Encounter: Payer: Self-pay | Admitting: Internal Medicine

## 2025-10-09 ENCOUNTER — Telehealth: Admitting: Adult Health

## 2025-11-18 ENCOUNTER — Inpatient Hospital Stay

## 2025-11-18 ENCOUNTER — Inpatient Hospital Stay: Admitting: Hematology
